# Patient Record
Sex: Male | Born: 1955
Health system: Southern US, Community
[De-identification: ages and names within clinical notes are randomized; demographics above are authoritative.]

## PROBLEM LIST (undated history)

## (undated) DIAGNOSIS — B370 Candidal stomatitis: Secondary | ICD-10-CM

## (undated) DIAGNOSIS — E7800 Pure hypercholesterolemia, unspecified: Secondary | ICD-10-CM

## (undated) DIAGNOSIS — I509 Heart failure, unspecified: Secondary | ICD-10-CM

## (undated) DIAGNOSIS — E669 Obesity, unspecified: Secondary | ICD-10-CM

## (undated) DIAGNOSIS — M1990 Unspecified osteoarthritis, unspecified site: Secondary | ICD-10-CM

## (undated) DIAGNOSIS — I739 Peripheral vascular disease, unspecified: Secondary | ICD-10-CM

## (undated) DIAGNOSIS — M109 Gout, unspecified: Secondary | ICD-10-CM

## (undated) DIAGNOSIS — E782 Mixed hyperlipidemia: Secondary | ICD-10-CM

## (undated) DIAGNOSIS — I429 Cardiomyopathy, unspecified: Secondary | ICD-10-CM

## (undated) DIAGNOSIS — I639 Cerebral infarction, unspecified: Secondary | ICD-10-CM

## (undated) DIAGNOSIS — I2510 Atherosclerotic heart disease of native coronary artery without angina pectoris: Secondary | ICD-10-CM

## (undated) DIAGNOSIS — E1169 Type 2 diabetes mellitus with other specified complication: Secondary | ICD-10-CM

## (undated) DIAGNOSIS — I252 Old myocardial infarction: Secondary | ICD-10-CM

## (undated) DIAGNOSIS — E119 Type 2 diabetes mellitus without complications: Secondary | ICD-10-CM

## (undated) DIAGNOSIS — F32A Depression, unspecified: Secondary | ICD-10-CM

## (undated) DIAGNOSIS — I10 Essential (primary) hypertension: Secondary | ICD-10-CM

## (undated) DIAGNOSIS — F329 Major depressive disorder, single episode, unspecified: Secondary | ICD-10-CM

## (undated) DIAGNOSIS — E785 Hyperlipidemia, unspecified: Secondary | ICD-10-CM

## (undated) HISTORY — DX: Heart failure, unspecified: I50.9

## (undated) HISTORY — DX: Major depressive disorder, single episode, unspecified: F32.9

## (undated) HISTORY — DX: Cardiomyopathy, unspecified: I42.9

## (undated) HISTORY — DX: Hyperlipidemia, unspecified: E78.5

## (undated) HISTORY — DX: Mixed hyperlipidemia: E78.2

## (undated) HISTORY — DX: Type 2 diabetes mellitus without complications: E11.9

## (undated) HISTORY — PX: ORIF WRIST FRACTURE: SHX2133

## (undated) HISTORY — DX: Atherosclerotic heart disease of native coronary artery without angina pectoris: I25.10

## (undated) HISTORY — DX: Old myocardial infarction: I25.2

## (undated) HISTORY — DX: Candidal stomatitis: B37.0

## (undated) HISTORY — PX: NECK SURGERY: SHX720

## (undated) HISTORY — DX: Essential (primary) hypertension: I10

## (undated) HISTORY — DX: Peripheral vascular disease, unspecified: I73.9

## (undated) HISTORY — DX: Unspecified osteoarthritis, unspecified site: M19.90

## (undated) HISTORY — DX: Obesity, unspecified: E66.9

## (undated) HISTORY — DX: Cerebral infarction, unspecified: I63.9

## (undated) HISTORY — DX: Pure hypercholesterolemia, unspecified: E78.00

## (undated) HISTORY — PX: LAMINOTOMY: SHX998

## (undated) HISTORY — DX: Gout, unspecified: M10.9

## (undated) HISTORY — DX: Type 2 diabetes mellitus with other specified complication: E11.69

## (undated) HISTORY — DX: Depression, unspecified: F32.A

---

## 1999-02-16 ENCOUNTER — Ambulatory Visit (HOSPITAL_COMMUNITY): Admission: RE | Admit: 1999-02-16 | Discharge: 1999-02-16 | Payer: Self-pay | Admitting: Family Medicine

## 1999-02-16 ENCOUNTER — Encounter: Payer: Self-pay | Admitting: Family Medicine

## 1999-03-23 ENCOUNTER — Encounter: Payer: Self-pay | Admitting: Neurosurgery

## 1999-03-23 ENCOUNTER — Observation Stay (HOSPITAL_COMMUNITY): Admission: RE | Admit: 1999-03-23 | Discharge: 1999-03-23 | Payer: Self-pay | Admitting: Neurosurgery

## 1999-04-14 ENCOUNTER — Ambulatory Visit (HOSPITAL_COMMUNITY): Admission: RE | Admit: 1999-04-14 | Discharge: 1999-04-14 | Payer: Self-pay | Admitting: Neurosurgery

## 1999-04-14 ENCOUNTER — Encounter: Payer: Self-pay | Admitting: Neurosurgery

## 1999-05-06 ENCOUNTER — Encounter: Payer: Self-pay | Admitting: Neurosurgery

## 1999-05-06 ENCOUNTER — Ambulatory Visit (HOSPITAL_COMMUNITY): Admission: RE | Admit: 1999-05-06 | Discharge: 1999-05-06 | Payer: Self-pay | Admitting: Neurosurgery

## 2001-10-17 HISTORY — PX: OTHER SURGICAL HISTORY: SHX169

## 2001-12-05 ENCOUNTER — Encounter: Payer: Self-pay | Admitting: Orthopedic Surgery

## 2001-12-05 ENCOUNTER — Inpatient Hospital Stay (HOSPITAL_COMMUNITY): Admission: EM | Admit: 2001-12-05 | Discharge: 2001-12-08 | Payer: Self-pay | Admitting: *Deleted

## 2002-03-08 ENCOUNTER — Ambulatory Visit: Admission: RE | Admit: 2002-03-08 | Discharge: 2002-03-08 | Payer: Self-pay | Admitting: Orthopedic Surgery

## 2005-01-06 ENCOUNTER — Ambulatory Visit (HOSPITAL_COMMUNITY): Admission: RE | Admit: 2005-01-06 | Discharge: 2005-01-06 | Payer: Self-pay | Admitting: Family Medicine

## 2005-03-15 ENCOUNTER — Ambulatory Visit (HOSPITAL_COMMUNITY): Admission: RE | Admit: 2005-03-15 | Discharge: 2005-03-15 | Payer: Self-pay | Admitting: Neurosurgery

## 2005-03-25 ENCOUNTER — Ambulatory Visit (HOSPITAL_COMMUNITY): Admission: RE | Admit: 2005-03-25 | Discharge: 2005-03-25 | Payer: Self-pay | Admitting: Neurosurgery

## 2005-04-26 ENCOUNTER — Encounter (HOSPITAL_COMMUNITY): Admission: RE | Admit: 2005-04-26 | Discharge: 2005-05-26 | Payer: Self-pay | Admitting: Neurosurgery

## 2006-10-17 DIAGNOSIS — I252 Old myocardial infarction: Secondary | ICD-10-CM

## 2006-10-17 HISTORY — DX: Old myocardial infarction: I25.2

## 2006-10-28 ENCOUNTER — Encounter: Admission: RE | Admit: 2006-10-28 | Discharge: 2006-10-28 | Payer: Self-pay | Admitting: Orthopedic Surgery

## 2007-12-16 ENCOUNTER — Encounter: Admission: RE | Admit: 2007-12-16 | Discharge: 2007-12-16 | Payer: Self-pay | Admitting: Orthopedic Surgery

## 2008-03-24 ENCOUNTER — Inpatient Hospital Stay (HOSPITAL_COMMUNITY): Admission: EM | Admit: 2008-03-24 | Discharge: 2008-03-26 | Payer: Self-pay | Admitting: Cardiology

## 2008-03-24 ENCOUNTER — Ambulatory Visit: Payer: Self-pay | Admitting: Cardiovascular Disease

## 2008-03-26 ENCOUNTER — Encounter: Payer: Self-pay | Admitting: Cardiology

## 2008-03-31 ENCOUNTER — Ambulatory Visit (HOSPITAL_COMMUNITY): Admission: RE | Admit: 2008-03-31 | Discharge: 2008-04-01 | Payer: Self-pay | Admitting: Cardiovascular Disease

## 2008-04-03 ENCOUNTER — Ambulatory Visit: Payer: Self-pay | Admitting: Cardiology

## 2008-04-11 ENCOUNTER — Ambulatory Visit: Payer: Self-pay | Admitting: Cardiovascular Disease

## 2008-08-01 ENCOUNTER — Ambulatory Visit: Payer: Self-pay | Admitting: Cardiovascular Disease

## 2008-08-17 HISTORY — PX: COLONOSCOPY: SHX174

## 2008-08-17 HISTORY — PX: ESOPHAGOGASTRODUODENOSCOPY: SHX1529

## 2008-08-21 ENCOUNTER — Ambulatory Visit: Payer: Self-pay | Admitting: Gastroenterology

## 2008-09-03 ENCOUNTER — Encounter: Payer: Self-pay | Admitting: Gastroenterology

## 2008-09-03 ENCOUNTER — Ambulatory Visit (HOSPITAL_COMMUNITY): Admission: RE | Admit: 2008-09-03 | Discharge: 2008-09-03 | Payer: Self-pay | Admitting: Gastroenterology

## 2008-09-03 ENCOUNTER — Ambulatory Visit: Payer: Self-pay | Admitting: Gastroenterology

## 2010-04-22 ENCOUNTER — Telehealth: Payer: Self-pay | Admitting: Cardiovascular Disease

## 2010-11-18 NOTE — Progress Notes (Signed)
Summary: meds question   Phone Note From Other Clinic   Caller: Nurse pam Summary of Call: Per Pam from Dr Gerda Diss ofc wants to know if pt will bee to take plavix the rest of his life or can he take Aspirin. ofc [id number] Initial call taken by: Edman Circle,  April 22, 2010 4:16 PM  Follow-up for Phone Call        We have not seen patient in 2 years.  Should be evaluated. But stent was in 2009.  Should be able to stop Plavix if stable Follow-up by: Colon Branch, MD, [facility],  April 25, 2010 10:40 PM  Additional Follow-up for Phone Call Additional follow up Details #1::        dr Gerda Diss office aware of above Deliah Goody, RN  April 26, 2010 9:12 AM

## 2011-02-04 ENCOUNTER — Ambulatory Visit (HOSPITAL_COMMUNITY)
Admission: RE | Admit: 2011-02-04 | Discharge: 2011-02-04 | Disposition: A | Discharge status: Home / Self Care | Payer: BC Managed Care – PPO | Source: Ambulatory Visit | Attending: Family Medicine | Admitting: Family Medicine

## 2011-02-04 ENCOUNTER — Other Ambulatory Visit (HOSPITAL_COMMUNITY): Payer: Self-pay | Admitting: Family Medicine

## 2011-02-04 DIAGNOSIS — M25519 Pain in unspecified shoulder: Secondary | ICD-10-CM

## 2011-02-04 DIAGNOSIS — M79609 Pain in unspecified limb: Secondary | ICD-10-CM | POA: Insufficient documentation

## 2011-02-04 DIAGNOSIS — R531 Weakness: Secondary | ICD-10-CM

## 2011-02-04 DIAGNOSIS — M79602 Pain in left arm: Secondary | ICD-10-CM

## 2011-02-04 DIAGNOSIS — M542 Cervicalgia: Secondary | ICD-10-CM | POA: Insufficient documentation

## 2011-02-08 ENCOUNTER — Other Ambulatory Visit: Payer: BC Managed Care – PPO

## 2011-02-08 ENCOUNTER — Ambulatory Visit (HOSPITAL_COMMUNITY): Payer: BC Managed Care – PPO

## 2011-02-08 ENCOUNTER — Ambulatory Visit
Admission: RE | Admit: 2011-02-08 | Discharge: 2011-02-08 | Disposition: A | Discharge status: Home / Self Care | Payer: BC Managed Care – PPO | Source: Ambulatory Visit | Attending: Family Medicine | Admitting: Family Medicine

## 2011-02-08 DIAGNOSIS — R531 Weakness: Secondary | ICD-10-CM

## 2011-02-08 DIAGNOSIS — M25519 Pain in unspecified shoulder: Secondary | ICD-10-CM

## 2011-02-21 ENCOUNTER — Encounter: Payer: Self-pay | Admitting: *Deleted

## 2011-02-21 ENCOUNTER — Encounter: Payer: Self-pay | Admitting: Cardiovascular Disease

## 2011-02-22 ENCOUNTER — Encounter: Payer: Self-pay | Admitting: Cardiovascular Disease

## 2011-02-22 ENCOUNTER — Ambulatory Visit (INDEPENDENT_AMBULATORY_CARE_PROVIDER_SITE_OTHER): Payer: BC Managed Care – PPO | Admitting: Cardiovascular Disease

## 2011-02-22 VITALS — BP 124/70 | HR 72 | Ht 75.0 in | Wt 276.0 lb

## 2011-02-22 DIAGNOSIS — I2510 Atherosclerotic heart disease of native coronary artery without angina pectoris: Secondary | ICD-10-CM

## 2011-02-22 MED ORDER — ASPIRIN EC 81 MG PO TBEC: 81.0000 mg | DELAYED_RELEASE_TABLET | Freq: Every day | ORAL | Status: AC

## 2011-02-22 MED ORDER — LISINOPRIL 5 MG PO TABS: 5.0000 mg | ORAL_TABLET | Freq: Every day | ORAL | Status: DC

## 2011-02-22 NOTE — Patient Instructions (Signed)
Your physician recommends that you schedule a follow-up appointment in: 1 year with Dr. Excell Seltzer  Your physician has recommended you make the following change in your medication: START LISINOPRIL 5 mg daily.  PLEASE START A BABY ASPIRIN 81 mg after your surgery.

## 2011-02-22 NOTE — Progress Notes (Signed)
HPI:  This is a 55 year-old gentleman presenting for preoperative cardiovascular evaluation. The patient has coronary artery disease with history of inferior wall myocardial infarction in 2008. He was treated with a bare-metal stent to the right coronary artery. He was noted to have no significant obstructive disease in the LAD or left circumflex.  The patient has a herniated cervical disc and will need cervical spine surgery. He has significant pain in his neck and left arm. He is maintaining good functional capacity over the past few years and does hard physical work without exertional symptoms. He specifically denies chest pain or chest pressure with activity. He denies dyspnea, edema, palpitations, orthopnea, or PND.  He has not been seen in cardiology followup since October 2009 when he was seen by Dr. Eden Emms in [place].  Outpatient Encounter Prescriptions as of 02/22/2011  Medication Sig Dispense Refill  . oxyCODONE-acetaminophen (PERCOCET) 10-325 MG per tablet As needed for pain      . clopidogrel (PLAVIX) 75 MG tablet Take 75 mg by mouth daily.        Marland Kitchen glipiZIDE (GLUCOTROL) 10 MG tablet Take 10 mg by mouth 2 (two) times daily before a meal.        . lisinopril (PRINIVIL,ZESTRIL) 5 MG tablet Take 5 mg by mouth daily.        . metFORMIN (GLUMETZA) 500 MG (MOD) 24 hr tablet Take 500 mg by mouth 2 (two) times daily with a meal.        . pioglitazone (ACTOS) 45 MG tablet Take 45 mg by mouth daily.          Review of patient's allergies indicates no known allergies.  Past Medical History  Diagnosis Date  . Diabetes mellitus   . HTN (hypertension)   . CAD (coronary artery disease)     status post PCI and bare metal stenting of   the proximal right coronary artery with placement of a Driver bare   metal stent.   . Myocardial infarction   . Hypercholesterolemia   . Obesity   . Hyperlipidemia   . DJD (degenerative joint disease)   . First degree AV block     with intermittent second  degree types I and    II heart block.   . Oral candidiasis   . Depression   . Heart murmur 1974  . Heart attack 2008    Past Surgical History  Procedure Date  . Right ankle fracture open reduction internal fixation. 2003  . Laminotomy 2001, 2006      Right L4-5 interlaminar laminotomy with excision of herniated   disc with operating microscope.  . Neck surgery   . Coronary artery bypass graft     2008    History   Social History  . Marital Status: Married    Spouse Name: N/A    Number of Children: N/A  . Years of Education: N/A   Occupational History  . Not on file.   Social History Main Topics  . Smoking status: Former Games developer  . Smokeless tobacco: Not on file   Comment: quit in 1979  . Alcohol Use: No  . Drug Use: No     in the past  . Sexually Active: Not on file   Other Topics Concern  . Not on file   Social History Narrative    He is married and has 3 kids.  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  He denies any tobacco or alcohol  use.     Family History  Problem Relation Age of Onset  . Cancer Mother   . Cancer Father   . Cancer Sister     ROS: General: no fevers/chills/night sweats Eyes: no blurry vision, diplopia, or amaurosis ENT: no sore throat or hearing loss Resp: no cough, wheezing, or hemoptysis CV: no edema or palpitations GI: no abdominal pain, nausea, vomiting, diarrhea, or constipation GU: no dysuria, frequency, or hematuria Skin: no rash Neuro: no headache, numbness, tingling, or weakness of extremities Musculoskeletal: no joint pain or swelling Heme: no bleeding, DVT, or easy bruising Endo: no polydipsia or polyuria  BP 124/70  Pulse 72  Ht 6\' 3"  (1.905 m)  Wt 276 lb (125.193 kg)  BMI 34.50 kg/m2  PHYSICAL EXAM: Pt is alert and oriented, obese gentleman in no distress. HEENT: normal Neck: JVP normal. Carotid upstrokes normal without bruits. No thyromegaly. Lungs: equal expansion, clear bilaterally CV: Apex is  discrete and nondisplaced, RRR without murmur or gallop Abd: soft, NT, +BS, no bruit, no hepatosplenomegaly Back: no CVA tenderness Ext: no C/C/E        Femoral pulses 2+= without bruits        DP/PT pulses intact and = Skin: warm and dry without rash Neuro: CNII-XII intact             Strength intact = bilaterally  EKG:  NSR 72 bpm, age-indeterminate inferior MI.  ASSESSMENT AND PLAN:

## 2011-03-01 NOTE — Consult Note (Signed)
NAME:  [name], [name] NO.:  [id number]   MEDICAL RECORD NO.:  [id number]          PATIENT TYPE:  AMB   LOCATION:  DAY                           FACILITY:  [facility]   PHYSICIAN:  Kassie Mends, M.D.      DATE OF BIRTH:  [birth year]   DATE OF CONSULTATION:  08/21/2008  DATE OF DISCHARGE:                                 CONSULTATION   REFERRING PHYSICIAN:  Donna Bernard, M.D.   REASON FOR CONSULTATION:  Abdominal pain and diarrhea for 4-5 months.   HISTORY OF PRESENT ILLNESS:  Mr. [name] is a 55 year old male who explains  of cramping followed by abdominal pain after eats.  His sugars have  never been well controlled.  He has 6-8 bowel movements a day  sometimes less.  He denies any fever.  He eats very little meal.  He  will consume milk with a cereal milkshake or cheese.  He rarely eats ice  cream.  He has 4-5 sticks of sugar less gum a day.  He has been on  Glucophage for 7-8 years.  He tried taking them off of for 2 weeks and  did not make any difference.  He denies any travel or antibiotic use.  He does have well water.  His wife does not have a diarrhea.  He drinks  4-5 Pepsi once a day and spends the day with tea up to a whole pitcher a  day.  He denies any blood in stool or black tarry stools.  He does have  some stress in raising his grandchild for 2-1/2 years.  He does not take  any BC powders, Goody powders, Aleve, or ibuprofen.  His weight has  increased from 285 pounds to 316 pounds since June 2009.  He denies any  heartburn, indigestion, or problems swallowing.  The abdominal pain is  gone after his bowel movements.  Pepto-Bismol used to work but not any  more.  He has never tried Imodium.  His bowel movements are usually  small quantities.   PAST MEDICAL HISTORY:  1. Diabetes, approximately 8 years.  2. Stent placement in June 2009.   PAST SURGICAL HISTORY:  1. Back surgery.  2. Neck surgery.   ALLERGIES:  No known drug allergies.    MEDICATIONS:  1. Glucophage 500 mg b.i.d.  2. Glucotrol 10 mg b.i.d.  3. Plavix 75 mg daily.  4. Aspirin 81 mg daily.  5. Actos 45 mg daily.  6. Ultravate ointment.   FAMILY HISTORY:  He denies any family history of colon cancer, colon  polyps, or diarrheal illnesses.   SOCIAL HISTORY:  He is married and has 3 kids.  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  He denies any tobacco or alcohol use.   REVIEW OF SYSTEMS:  Per the HPI otherwise all systems are negative.   PHYSICAL EXAMINATION:  VITAL SIGNS:  BMI 38.5 (severely obese),  temperature 97, blood pressure 124/72, and pulse 76.  GENERAL:  He is no apparent distress, alert, and oriented x4.HEENT:  Atraumatic and normocephalic.  Pupils equal and  reactive to light.  Mouth, no oral lesions.  Posterior pharynx without erythema or  exudate.LUNGS:  Clear to auscultation bilaterally.CARDIOVASCULAR:  Regular rhythm.  No murmur.  Normal S1 and S2.ABDOMEN:  Bowel sounds  present, soft, nontender, and nondistended.  No rebound or  guarding.EXTREMITIES:  No cyanosis or edema. NEURO:  He has no focal  neurologic deficits.   LABORATORY DATA:  October 2009, O&P negative x2, stool culture negative,  fecal lactoferrin negative, and C.diff negative.   ASSESSMENT:  Mr. [name] is a 55 year old male with diarrhea.  The  differential diagnoses includes irritable bowel diarrhea predominant,  microscopic colitis, small bowel bacterial overgrowth, fructose  intolerance, or diabetic enteropathy.  Thank for allowing me to see Mr.  [name] in consultation.  My recommendations are as follows.   RECOMMENDATIONS:  1. Will check stools for Giardia antigen.  He will be scheduled for a      colonoscopy followed by an esophagogastroduodenoscopy.  The      esophagogastroduodenoscopy will be done for duodenal biopsies to      evaluate for celiac sprue.  2. If his upper endoscopy and his colonoscopy did not reveal any      etiology for his diarrhea, then he  will have a hydrogen breath test      for fructose intolerance, then for small bowel bacterial      overgrowth.  3. He may continue the aspirin and the Plavix due to his recent stent.      Will add Prilosec 20 mg daily.  He asked to use Imodium 1-2, 30      minutes before meals.  4. He will hold half of Glucotrol the night before his colonoscopy and      hold his Glucotrol on the day of his colonoscopy as well as his      Actos.  5. He has a follow up appointment to see me in 2-3 months.      Kassie Mends, M.D.  Electronically Signed     SM/MEDQ  D:  08/21/2008  T:  08/22/2008  Job:  [id number]   cc:   Donna Bernard, M.D.  Fax: [phone]

## 2011-03-01 NOTE — Op Note (Signed)
NAME:  [name], [name] NO.:  [id number]   MEDICAL RECORD NO.:  [id number]          PATIENT TYPE:  AMB   LOCATION:  DAY                           FACILITY:  [facility]   PHYSICIAN:  Kassie Mends, M.D.      DATE OF BIRTH:  13-Aug-1956   DATE OF PROCEDURE:  09/03/2008  DATE OF DISCHARGE:                               OPERATIVE REPORT   REFERRING PHYSICIAN:  Dr. Lubertha South, MD   PROCEDURE:  1. Colonoscopy.  Random cold forceps biopsy and biopsy of a transverse      colon lesion.  2. Esophagogastroduodenoscopy with cold forceps biopsy of the gastric      and duodenal mucosa.   INDICATION FOR EXAM:  [name] is a 55 year old male complains of  chronic diarrhea. He has never had a colonoscopy.   FINDINGS:  1. A tortuous colon requiring the patient to be in the supine position      successfully intubate the cecum.  Pillow like 6-mm distal      transverse colon lesion with pillow-like texture.  Biopsies      obtained via cold forceps.  Random biopsies obtained via cold      forceps to evaluate for microscopic colitis.  2. Frequent sigmoid colon diverticula.  Otherwise, no polyps, masses,      inflammatory changes, or AVMs seen.  3. Normal retroflexed view of the rectum.  4. Normal esophagus without evidence of Barrett's mass, erosion,      ulceration, or stricture.  5. Few gastric erosions.  Biopsies obtained via cold forceps to      evaluate for H. pylori gastritis.  6. Few superficial duodenal ulcers seen in the duodenal bulb.      Biopsies obtained via cold forceps.  Additional biopsies were      obtained via cold forceps to evaluate for celiac sprue in the      second portion of the duodenum.   DIAGNOSES:  1. No obvious source for [name] diarrhea identified.  2. Mild sigmoid colon diverticulosis.  3. Distal transverse colon lesion, likely a lipoma   RECOMMENDATIONS:  1. Will call [name] with the results of his biopsies.  2. He should hold aspirin and  antiinflammatory drugs for 14 days.  He      may continue his Plavix.  3. He should start using his Prilosec once daily.  He should also use      his Imodium as needed for his diarrhea.  4. He should follow high-fiber diet.  He was given a handout on high-      fiber diet and gastritis.  5. Screening colonoscopy in 10 years.  6. He already has a follow up appointment to see me in 2-3 months.   MEDICATIONS:  1. Demerol 1 mg IV.  2. Versed 7 mg IV.   PROCEDURE TECHNIQUE:  Physical exam was performed.  Informed consent was  obtained from the patient explaining the benefits, risks, and  alternatives of procedure.  The patient was connected to monitor and  placed in left lateral position.  Continuous oxygen was  provided by  nasal cannula, IV medicine administered through an indwelling cannula.  After administration of sedation and rectal exam, the patient's rectum  was intubated and the scope was advanced under direct visualization to  the cecum.  Scope was removed slowly by carefully examining the color,  texture, anatomy, and integrity mucosa on the way out.   After the colonoscopy, the patient's esophagus was intubated with  diagnostic gastroscope.  The scope advanced under direct visualization  to the second portion of the duodenum.  The scope was removed slowly by  carefully examining the color, texture, anatomy, and integrity of the  mucosa on the way out.  The patient was recovered in endoscopy  discharged home in satisfactory condition.   PATH:  Melanosis Coli. Lipoma of colon. Celiac sprue. Gastritis      Kassie Mends, M.D.  Electronically Signed     SM/MEDQ  D:  09/03/2008  T:  09/03/2008  Job:  [id number]   cc:   Donna Bernard, M.D.  Fax: [phone]

## 2011-03-01 NOTE — H&P (Signed)
NAME:  [name], [name] NO.:  [id number]   MEDICAL RECORD NO.:  [id number]          PATIENT TYPE:  INP   LOCATION:  2302                         FACILITY:  [facility]   PHYSICIAN:  Veverly Fells. Excell Seltzer, MD  DATE OF BIRTH:  [birth year]   DATE OF ADMISSION:  03/24/2008  DATE OF DISCHARGE:                              HISTORY & PHYSICAL   PRIMARY CARDIOLOGIST:  [facility].  He will follow up in  [place].   PRIMARY CARE Teodora Baumgarten:  Dr. Gerda Diss.   PATIENT PROFILE:  A 54 year old Caucasian male without prior cardiac  history who presents with acute inferior ST segment elevation,  myocardial infarction.   PROBLEM LIST:  1. Acute inferior ST segment elevation, myocardial infarction.  2. Obesity.  3. Type 2 diabetes mellitus, diagnosed approximately 6 years ago.  4. History of cervical fusion.  5. History of right ACL and MCL repair with right ankle fracture,      status post fall in February 2003.  6. Status post L4-L5 laminotomy and microdissection.  7. Remote tobacco abuse with a 15-pack-year history quitting      approximately 35 years ago.   HISTORY OF PRESENT ILLNESS:  A 55 year old married Caucasian male  without prior cardiac history.  He was in his usual state of health  until approximately 1 week ago when he began to note increasing dyspnea  on exertion.  On Friday, March 21, 2008, he was at work and using a  wheeled walker and noted left shoulder interval pain with left hand and  digit tingling associated with dyspnea and diaphoresis.  Symptoms lasted  for a few minutes and improved with slowing of phase.  He had recurrent  symptoms later that evening, but at this time at rest and associated  with chest pressure and nausea.  He felt poorly the remainder of the  weekend with symptoms intermittently throughout the day, on Saturday and  Sunday and when he woke up this morning, got out of the shower, he felt  somewhat nauseated and washed out.  For these  reasons, his primary care  Carolle Ishii, Dr. Gerda Diss, an ECG was performed in the office showing 2-to 3-  mm inferior ST segment elevation with 1-to 2-mm anterolateral ST segment  depression.  I was advised that the patient presents to the ED and his  wife drove him to [facility] where code STEMI was activated and  the patient received 5000 units of intravenous heparin prior to being  transferred to [facility].  He arrived here at 12:45 p.m. and  catheterization has shown occlusion of the proximal right coronary  artery.   ALLERGIES:  No known drug allergies.   HOME MEDICATIONS:  Glucotrol, Glucophage, and Avandia all of which the  patient takes intermittently, and the last took approximately 1 week  ago.   FAMILY HISTORY:  Mother died of cancer at 7.  Father died of cancer at  15.  He has 2 sisters, one died of cancer at age 66 and another is alive  and well.   SOCIAL HISTORY:  Lives in  [place] with his wife and a 71-1/2-year-old  grandson.  He works as a Administrator.  He has about a 15-pack-year  history of tobacco abuse, smoking a pack and half a day for 10 years,  quitting 35 years ago.  Denies any alcohol use.  He does smoke marijuana  every other day.  He is not routinely exercise.   REVIEW OF SYSTEMS:  Positive for chest pain, shortness of breath,  dyspnea on exertion, all as outlined in the HPI.  He also had associated  chills and sweats last night.  He has had a sore throat over the past  week.  He had this nausea through over the weekend and this morning.  He  has been diagnosed with diabetes for about 6 years.  He is a full code.   PHYSICAL EXAMINATION:  VITAL SIGNS:  He is afebrile.  Heart rate is 55,  respirations 20, blood pressure 120/82, and pulse ox 97% on 3 L.  GENERAL:  Pleasant, Caucasian male in no acute distress.  Awake, alert,  and oriented x3.  HEENT:  Normal with the exception what appears to be oropharyngeal  thrush.  NECK:  No bruits or  JVD.  LUNGS:  Respirations are unlabored and clear to auscultation.  CARDIAC:  Regular with distant S1 and S2.  No S3, S4, or murmurs.  ABDOMEN:  Round, soft, nontender, and nondistended.  Bowel sounds are  present x4.  EXTREMITIES:  Warm, dry, and pink.  No clubbing, cyanosis, or edema.  Dorsalis pedis and posterior tibial pulses 2+ and equal bilaterally.   Chest x-ray is pending.  EKG shows sinus bradycardia rate of 54 and  normal axis, 2-to 3-mm ST segment elevation in leads 2, 3, and aVF, 1-to  2-mm ST depression in 1, aVL, V2, and V3.  Creatinine by I-Stat here in  the cath lab is 0.7.   ASSESSMENT AND PLAN:  1. Acute inferior ST segment elevation myocardial infarction for      emergent catheterization PCI.  Preliminary results, catheterization      reveal normal left coronary tree and occluded proximal RCA.  He is      undergoing PCI currently.  Add aspirin, Plavix, and high-dose      statin.  Hold off on beta-blocker for now in setting of bradycardia      with pending RCA intervention.  2. Diabetes mellitus, noncompliant with p.o. meds at home.  Hold      Glucophage, add sliding scale insulin.  3. Obesity, cardiac rehab.  4. Lipid status, currently unknown.  Check lipids and LFTs, add high-      dose statin.  5. ?Oropharyngeal candida.  Add nystatin swish and swallow.      Nicolasa Ducking, ANP      Veverly Fells. Excell Seltzer, MD  Electronically Signed    CB/MEDQ  D:  03/24/2008  T:  03/25/2008  Job:  [id number]

## 2011-03-01 NOTE — Discharge Summary (Signed)
NAME:  [name] [name] NO.:  [id number]   MEDICAL RECORD NO.:  [id number]          PATIENT TYPE:  INP   LOCATION:  3736                         FACILITY:  [facility]   PHYSICIAN:  Veverly Fells. Excell Seltzer, MD  DATE OF BIRTH:  Aug 09, 1956   DATE OF ADMISSION:  03/24/2008  DATE OF DISCHARGE:  03/26/2008                               DISCHARGE SUMMARY   PRIMARY CARE Deklyn Trachtenberg:  Dr. Gerda Diss.   PRIMARY CARDIOLOGIST:  Will be Dr. Eden Emms in [place].   DISCHARGE DIAGNOSIS:  Acute inferior ST segment elevation myocardial  infarction.   SECONDARY DIAGNOSES:  1. Coronary artery disease status post PCI and bare metal stenting of      the proximal right coronary artery with placement of a Driver bare      metal stent.  2. Hyperlipidemia with multiple statin intolerances including Lipitor,      Zocor, Pravachol and Crestor.  3. Marijuana abuse, which is ongoing.  4. Poorly-controlled type 2 diabetes mellitus with a hemoglobin A1c of      12.1 on this admission.  5. Medication non-adherence.  6. Remote tobacco abuse with a 15 pack year history, quitting      approximately 35 years ago.  7. History of degenerative disk disease.  8. Obesity.  9. First degree AV block with intermittent second degree types I and      II heart block.  10.Oral candidiasis.   ALLERGIES:  No known drug allergies.   PROCEDURES:  1. Left heart cardiac catheterization with successful PCI and stenting      of the right coronary artery.  2. 2-D echocardiogram.   HISTORY OF PRESENT ILLNESS:  A 55 year old Caucasian male without prior  cardiac history.  He was in his usual state of health until  approximately 1 week prior to admission when he began to note increasing  dyspnea on exertion.  By Friday, March 21, 2008, he began to experience  left shoulder and hand pain and tingling associated with dyspnea and  diaphoresis that was occurring with exertion.  Symptoms occurred  intermittently throughout the  weekend and were significantly worse with  exertion.  He saw his primary care [name] [name] on the morning of March 24, 2008, and he was noted to have significant inferior ST segment elevation  with anterolateral ST segment depression.  He was transported via his  car to [facility] where a code STEMI was activated, and the  patient received 5,000 units of IV heparin.  He was transferred to [facility] for further evaluation.   HOSPITAL COURSE:  The patient underwent emergent left heart cardiac  catheterization including a totally occluded proximal RCA with otherwise  non-obstructive disease.  The RCA was successfully stented with a Driver  bare metal stent.  EF was 45% at the time of catheterization.  Post  procedure, the patient was monitored in the coronary intensive care unit  and was noted to exhibit second degree type 2 diabetes heart block with  2:1 block.  For this reason, as well as the baseline bradycardia, beta  blocker therapy was never  initiated.  He was maintained on aspirin and  Plavix therapy.  Because of his history of multiple intolerances to  potent statins, he was started on lovastatin 40 mg q.h.s. in hopes that  he would tolerate this.  He was transferred out to the floor on March 25, 2008 and was initiated on ACE inhibitor therapy secondary to reduced EF.  Throughout his hospital course, his ECG has continued to show 1 to 2 mm  ST segment elevation in the inferior leads with anterolateral ST  depression.  Because of this persistent abnormal ECG, we have made a  copy of the patient's ECG and have given it to him to carry in his  wallet.   [name] has been ambulating without difficulty.  This morning, he has  exhibited first degree AV block with a PR interval of up to 320 ms.  He  has not had any significant pauses and is asymptomatic.  Because of his  heart block, we have arranged for him to have a 48-hour Holter monitor  to be picked up on Monday, March 31, 2008  at 9 a.m. at [facility].  We will be looking for any significant pauses or findings  that may require permanent pacemaker placement.   Overall, [name] has recovered well.  He did undergo a 2-D  echocardiogram this morning, and the results of that are currently  pending.  He is being discharged home today in good condition.   DISCHARGE LABORATORIES:  Hemoglobin 13.6, hematocrit 38.6, WBC 11.0,  platelets 233.  Sodium 139, potassium 3.8, chloride 105, CO2 of 26, BUN  15, creatinine 0.73, glucose 203, total bilirubin 1.3, alkaline  phosphatase 82, AST 108, ALT 77, total protein 6.1, albumin 3.4, calcium  8.4, hemoglobin A1c 12.1.  Peak CK of 625.  Peak MB 29.5.  Peak troponin  I of 20.93.  Total cholesterol 157, triglycerides 162, HDL 28, LDL 97,  TSH 1.810.   DISPOSITION:  The patient is being discharged home today in good  condition.   FOLLOW-UP PLANS AND APPOINTMENTS:  He will obtain a 48-hour Holter  monitor on Monday, March 31, 2008 at 9 a.m. at [facility].  He  will subsequently follow up with Dr. Gerda Diss in 1-2 weeks and Dr. Eden Emms  on April 11, 2008 at 11:30 a.m.   DISCHARGE INSTRUCTIONS:  The patient has been counseled on the  importance of medication compliance.  While hospitalized, he has been  counseled on the importance of marijuana cessation, as well as being  seen and evaluated by the diabetes management and education team.  He  has been given information with regards to free diabetes education  classes at [facility].  He was previously taking Actos,  glipizide and metformin, although he self discontinued these.  It has  been recommended that he reinitiate these.   DISCHARGE MEDICATIONS:  1. Aspirin 325 mg daily.  2. Plavix 75 mg daily.  3. Lovastatin 40 mg q.h.s.  4. Metformin 1000 mg b.i.d. to resume March 27, 2008.  5. Actos 45 mg daily.  6. Glipizide 10 mg b.i.d.  7. Lisinopril 5 mg daily.  8. Nystatin swish and swallow 100,000  units/mL, 5 mL q.i.d.  9. Nitroglycerin 0.4 mg sublingual p.r.n. chest pain.   OUTSTANDING LABORATORY STUDIES:  1. A 2-D echocardiogram is pending from this morning.  2. A 48-hour Holter monitor is pending next week.   DURATION OF DISCHARGE ENCOUNTER:  Sixty minutes including physician  time.      Nicolasa Ducking, ANP      Veverly Fells. Excell Seltzer, MD  Electronically Signed    CB/MEDQ  D:  03/26/2008  T:  03/26/2008  Job:  [id number]   cc:   Dr. Gerda Diss

## 2011-03-01 NOTE — Assessment & Plan Note (Signed)
[facility] HEALTHCARE                       [place] CARDIOLOGY OFFICE NOTE   Rolla, Kedzierski HARRELL NIEHOFF                       MRN:          [id number]  DATE:04/11/2008                            DOB:          1955/11/01    Mr. [name] is seen today as a new patient by me.  He was hospitalized for  an acute inferior wall MI by Dr. Excell Seltzer on March 25, 2008.  The patient  did not have disease in his LAD or CIRC.  He had a total occlusion of  the right coronary artery.   His EF was in the 45% range.  She had a bare-metal stent.  It took me a  while to review his images on [employer].   Again, I had to review his entire chart in hospital stay since I was not  involved with his care.  The patient has been under a lot of stress  lately.  He has a daughter who is not taking care of herself.  She is  living with another person.  She had been living at home with him.  However, she decided to leave and left his 56-year-old grandson with him,  so the patient was dealing with work full-time and raising his 2-year-  old grandson.  Prior to his heart attack, he had been smoking both  cigarettes and pot for over 45 years.  He says he has not gone back to  this.  I counseled him for less than 10 minutes regarding the problems  with smoking.  Clearly, the marijuana is less of a risk, neither one of  them is great for him.  He is continuing to abstain.  He has not been  good with his diabetes.  He has not been taking his Glucophage.  I also  explained to him the importance of tight glucose control in regards to  recurrent coronary disease.  His medical doctor normally follows this.   He has been taking his other medications.  However, there is some  confusion as he lists both pravastatin and lovastatin as his cholesterol  pills.  He will call us and I encouraged him to take only the lovastatin  rather than the pravastatin, since he was prescribed a higher dose.  He  has not had any side  effects from his medicines.  He is bruising a  little easier on the aspirin and Plavix.  He did get a bare-metal stent  and theoretically should only need to be on Plavix for 4 weeks.   The patient has been in and out of work quite a bit, and apparently he  wants to go back to work full-time on April 25, 2008, as his benefits ran  out and he needs money.  He has had issues with work before regarding  arthritis of his knees.  He had a fairly uncomplicated MI and did not  have residual left-sided disease.  His EF is not drastically reduced,  and I think it is okay for him to return to work without limitations on  April 25, 2008.  He is to start cardiac rehab at  [facility] this coming  Tuesday.   REVIEW OF SYSTEMS:  Otherwise remarkable for some mild back pain.  He is  not having any recurrent chest pain.   MEDICATIONS:  1. Metformin 500 b.i.d.  2. Glipizide 10 b.i.d.  3. Actos 45 a day.  4. Pravastatin or lovastatin, not sure which one he will end up taking      for his cholesterol.  5. Plavix 75 a day.  6. Lisinopril 5 a day.  7. Aspirin a day.   PHYSICAL EXAMINATION:  GENERAL:  Remarkable for an overweight white male  in no distress.  VITAL SIGNS:  His weight is 282, blood pressure 110/73, pulse 72 and  regular, respiratory rate 14, and afebrile.  HEENT:  Unremarkable.  NECK:  Carotids are without bruit.  No lymphadenopathy, thyromegaly, or  JVP elevation.  LUNGS:  Clear.  Good diaphragmatic motion.  No wheezing.  HEART:  S1 and S2.  Normal heart sounds.  PMI normal.  ABDOMEN:  Benign, bowel sounds positive.  No hepatosplenomegaly or  hepatojugular reflux.  No tenderness.  No bruit.  EXTREMITIES:  Distal pulses are intact.  No edema.  Cath site is well  healed.  NEUROLOGIC:  Nonfocal.  SKIN:  Warm and dry.  MUSCULOSKELETAL:  No muscular weakness.   IMPRESSION:  1. Inferior myocardial infarction and coronary artery disease,      currently stable.  Continue cardiac rehab.   Return to work on April 25, 2008.  Continue aspirin and beta-blocker.  2. Hypercholesteremia in the setting of coronary artery disease.  He      will call us and likely be on lovastatin 40 a day.  This dose      should be escalated as needed.  Lipid and liver profile in 6      months.  3. Hypertension, currently well controlled.  Continue lisinopril,      particularly in light of his diabetes.  4. Diabetes mellitus.  The patient has a home monitoring kit.  He will      start taking his metformin on a more regular basis.  I gave him a      target fasting sugar of under 100 and postprandial 2-hour sugar of      under 140.  He will follow up with his primary care medical doctor.      In the long run, it would be nice to get him off Actos if his      sugars are under poor control with metformin, and glipizide or      Januvia may be a better drug to add.  He was counseled on a low-      carbohydrate diet.  5. Previous smoking.  Again talked to the patient at length.  He will      call us if he feels the urge to restart and we will give him a      prescription for Wellbutrin.  For the time being since he gas      myocardial infarction, he has stopped both marijuana smoking and      cigarette smoking.   The patient will follow up with one of the [place] doctors in 3  month.     Noralyn Pick. Eden Emms, MD, [facility]  Electronically Signed    PCN/MedQ  DD: 04/11/2008  DT: 04/12/2008  Job #: [id number]

## 2011-03-01 NOTE — Assessment & Plan Note (Signed)
[facility] HEALTHCARE                            CARDIOLOGY OFFICE NOTE   [name], [name]                       MRN:          [id number]  DATE:08/01/2008                            DOB:          06/11/1956    We are seeing him today in followup.  He is the patient I have seen up  in [place].  He had an inferior wall MI treated by Dr. Excell Seltzer in June  of 2009.  He has done well.  He did not have residual left-sided  disease.  He has good LV function.   Unfortunately, he continues to have problems regarding his weight.  He  is up over 300 pounds now.  He is back to work at the [employer].  His sugar has been a problem.  Needless to say with his weight gain, it  has been difficult to control.  I told him to talk to Dr. Gerda Diss at  length about his Actos.  This is not an ideal oral medication for a  heart patient who weighs over 300 pounds to be on.  I suspect it is the  part of the reason his weight has gone up.  He had some stomach  abnormalities and may have gastroparesis or irritable bowel syndrome.  He may be having a colonoscopy.  Apparently, his metformin was stopped,  but this did not help the problem.  I wonder if he should be on Reglan.  In general, I think I would rather have him on Byetta or Lantus insulin,  than on any  Actos.  Hopefully, this can be stopped by Dr. Gerda Diss.  He  has been compliant with his aspirin and Plavix.  He is not having any  significant chest pain.  He continues to have some anxiety.   REVIEW OF SYSTEMS:  Otherwise negative.   PHYSICAL EXAMINATION:  GENERAL:  Remarkable for an overweight white  male, in no distress.  VITAL SIGNS:  Weight is 310, blood pressure 120/75, pulse 79, regular  respiratory rate 14, afebrile.  HEENT:  Unremarkable.  Carotids normal without bruit.  No  lymphadenopathy, thyromegaly, or JVP elevation.  LUNGS:  Clear.  Good diaphragmatic motion.  No wheezing.  S1 and S2.  Normal heart sounds  PMI distant, nonpalpable.  ABDOMEN:  Protuberant.  Bowel sounds positive.  No AAA.  No tenderness.  No bruit.  No hepatosplenomegaly, hepatojugular reflex, tenderness, or  bruit.  Distal pulses are intact.  No edema.  NEURO:  Nonfocal.  SKIN:  Warm and dry.  No muscular weakness.   His electrocardiogram shows sinus rhythm at a rate of 74, inferior wall  MI with Q's in II, III and aVF.   IMPRESSION:  1. Inferior wall myocardial infarction, coronary artery disease, stent      to the right coronary artery.  Continue aspirin and Plavix.  2. Diabetes, follow with Dr. Gerda Diss, hemoglobin A1c quarterly.  Try to      stop Actos and substitute to target hemoglobin A1c 6.5 or less.  3. Hypertension currently well controlled.  Continue lisinopril, low-  sodium diet.  4. Hypercholesterolemia.  Continue pravastatin, lipid and liver      profile in 6 months.     Noralyn Pick. Eden Emms, MD, [facility]  Electronically Signed    PCN/MedQ  DD: 08/01/2008  DT: 08/02/2008  Job #: [id number]

## 2011-03-04 NOTE — Op Note (Signed)
[place]  Patient:    [name], [name] Visit Number: [id number] MRN: [id number]          Service Type: MED Location: [id number] Attending Physician:  Burnard Bunting Dictated by:   Cammy Copa, M.D. Proc. Date: 12/05/00 Admit Date:  12/05/2001 Discharge Date: 12/08/2001                             Operative Report  PREOPERATIVE DIAGNOSIS:  Right ankle bimalleolar ankle fracture.  POSTOPERATIVE DIAGNOSIS:  Right ankle bimalleolar ankle fracture.  PROCEDURE:  Right ankle fracture open reduction internal fixation.  SURGEON:  Cammy Copa, M.D.  ANESTHESIA:  General endotracheal.  ESTIMATED BLOOD LOSS:  50 cc.  DRAINS:  None.  TOURNIQUET TIME:  116 minutes at 300.  OPERATIVE FINDINGS:  Right knee examination under anesthesia, range of motion 0 to 120 degrees with about 5 to 6 cm opening to valgus stress at 0 degrees, and about 6 to 7 mm of opening to valgus stress at 30 degrees.  ACL was out with positive pivot shift.  The patient also had soft tissue swelling around the ankles.  DESCRIPTION OF PROCEDURE:  The patient was brought to the operating room where general endotracheal anesthesia was induced.  Preoperative IV antibiotics were administered.  The right ankle was prepped with Duraprep solution and draped in a sterile manner.  Collier Flowers was used to cover the operative field.  Leg was elevated and exsanguinated with the Esmarch wrap, tourniquet was inflated.  A long incision was made beginning at the tip of the malleolus and extending proximally.  Superficial peroneal nerve was identified, and mobilized anteriorly.  Periosteum was split over the fracture anteriorly and posteriorly.  Initial attempts at reducing the lateral fracture were unsuccessful, and thus attention was directed towards fixing the medial malleolus first.  At this time, a J shaped incision was made over the medial malleolus.  Periosteum was elevated for  1 mm distally and 1 mm proximally around the fracture site.  Anatomic reduction of the fracture was achieved. Two 4/5 screws were then placed with anatomic reduction of the medial malleolus fracture.  This was then reduced from the lateral malleolus.  The medial incision was irrigated, and then closed using two 3-0 Vicryl sutures followed by simple 3-0 nylon sutures.  The lateral side was revisited, and good reduction was obtained on the lateral malleolus.  Fracture was comminuted and extended up the shaft.  Two lag screws were placed to secure the fracture fragments.  A 1/3 recon plate was then contoured to fit the malleolus.  A locking plate was utilized because of the severe nature of the fracture.  The oblique portion of the fracture extended very low making fixation of the distal malleolar piece difficult.  Nonetheless, excellent fixation was achieved with the locking plate in both the proximal and distal fragments. The mortis was found to be stable under fluoroscopy after placing the recon plate.  The recon plate was chosen because of its added strength and resistance to breakage in this large individual.  The tourniquet was released. Bleeding points were encountered and controlled with electrocautery.  Soft tissue coverage was obtained over the plate by reapproximating the periosteal sleeves with 3-0 Vicryl suture.  Skin and subcutaneous tissue were closed using interrupted inverted 3-0 Vicryl suture and simple 3-0 nylon sutures.  A soft dressing and bulky splint were applied.  Pedal pulses were  palpable at the conclusion of the case.  Reduction was anatomic in both AP and lateral planes at the conclusion of the case.  The patient tolerated the procedure well without immediate complications. Dictated by:   Cammy Copa, M.D. Attending Physician:  Burnard Bunting DD:  01/05/02 TD:  01/07/02 Job: [id number]

## 2011-03-04 NOTE — Consult Note (Signed)
[place]. [facility]  Patient:    [name], [name]. Visit Number: [id number] MRN: [id number]          Service Type: Attending:  Anastasio Auerbach, M.D. Dictated by:   Anastasio Auerbach, M.D. Proc. Date: 12/05/01   CC:         Cammy Copa, M.D.  Lilyan Punt, M.D. (FAX [phone])   Consultation Report  DATE OF BIRTH:  [birth year]  REFERRING PHYSICIAN:  Dorene Grebe, M.D.  REASON FOR CONSULTATION:  Diabetes and medical management.  IMPRESSION: 1. Diabetes mellitus, type 2.    a. Diagnosed five years ago.    b. Glycohemoglobin November 2002 was 6.1%. 2. Accidental fall.    a.  Right trimalleolar ankle fracture.    b. Right anterior cruciate ligament talus tear with high-grade medial       collateral ligament injury. 3. Mild normocytic anemia (question baseline)    a. Preoperative hemoglobin 12.0.    b. Mean corpuscular volume 84. 4. Mild leukocytosis (12,100), suspected stress related. 5. History of cervical disk "rupture" status post fusion. 6. History of right hand injury with surgery. 7. Obesity. 8. Dyslipidemia.  RECOMMENDATIONS: 1. Continue home medications (including Glucophage, renal function    normal and low risk of receiving IV contrast this admission). 2. An 1800 calorie ADA diet. 3. CBGs q.a.c. and h.s. with sliding scale insulin as written. 4. Check glycohemoglobin and TSH. 5. Serum iron. 6. DVT prophylaxis with Lovenox when felt safe from operative standpoint.  HISTORY OF PRESENT ILLNESS:  [name] is a 55 year old, obese, Caucasian male with a five-year history of type 2 diabetes mellitus.  He presents after an accidental fall.  He fractured his right ankle and injured his right knee. [name] is planning to operate this afternoon.  We were asked to help with management of his diabetes mellitus.  In talking with his primary care physicians office, I discovered his most recent glycohemoglobin was done in November 2002 and was 6.1%.   This is actually quite good control.  We will repeat that here to see if that has been sustained.  He is currently having ankle pain.  He has no history of heart problems, chest pain, or shortness of breath.  HOME MEDICATIONS: 1. Avandia 4 mg p.o. q.a.m. 2. Zocor 20 mg q.d. 3. Glucophage 1000 mg b.i.d. 4. Glucotrol XL 10 mg b.i.d. 5. Glucosamine.  ALLERGIES:  None.  SOCIAL HISTORY:  [name] is married.  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 He has three children, but only one remaining at home at age 9.  He is a nonsmoker.  He rarely drinks.  No drug use.  FAMILY HISTORY:  Positive for heart disease.  PAST MEDICAL HISTORY:  See Impression.  REVIEW OF SYSTEMS:  [name] denies any visual changes.  No headache.  No significant weight changes.  No dysarthria.  No dysphagia.  No cough.  No hemoptysis.  No hematemesis.  No abdominal pain.  He has chronically loose bowel movements with no change in this.  He has urinary frequency but no hesitancy.  I suspect this is due to intermittent hyperglycemia.  He suffers from somewhat chronic right knee pain.  He has taken Lodine in the past.  No history of thyroid disease.  No history of heart problems.  No history of kidney problems.  He has not undergone eye examination since his diagnosis of diabetes.  He has no complaints typical of peripheral neuropathy.  No history of  bleeding or clotting.  Review of Systems except as previously mentioned is negative.  PHYSICAL EXAMINATION:  GENERAL:  Pleasant.  Cooperative.  In no acute distress at rest.  VITAL SIGNS:  Temperature 99.3, blood pressure 131/78, heart rate 81, respiratory rate 20, oxygen saturation 94% on room air.  Morning blood sugar 161.  HEENT:  Atraumatic, normocephalic.  Pupils are equal, round and reactive to light.  Cranial nerves II-XII grossly intact.  Pharynx clear.  Mucous membranes moist.  NECK:  Supple.  No adenopathy, no mass, no bruits, obese.  LUNGS:  Clear  bilaterally with fair air movement.  No crackles, wheeze, or rhonchi.  HEART:  Regular.  Normal S1, S2.  Somewhat distant.  No audible murmur, although he says he has a childhood history of murmur.  ABDOMEN:  Obese, mildly protuberant.  Nontender, nondistended.  No palpable or percussible masses or organomegaly.  GU/RECTAL:  Exams deferred.  EXTREMITIES:  Right ankle fractured.  No palpable cords.  No cyanosis.  No rash.  No clubbing.  MUSCULOSKELETAL:  Right ankle fracture.  No atrophy.  No fasciculations.  NEUROLOGIC:  The patient is alert and oriented x 3.  Cranial nerves as above. Motor exam reveals normal strength bilaterally except in the right lower extremity.  Sensation is grossly intact.  Gait not assessed.  Upper extremity and cerebellar function normal.  LABORATORY DATA:  Hemoglobin 12.0, MCV 84, WBC 12,100, platelet count 253. Pro time 13.5.  PTT 28.  Sodium 138, potassium 4.0, chloride 106, bicarb 26, BUN 13, creatinine 0.8, glucose 171, calcium 8.4.  DIAGNOSES: 1. Diabetes mellitus, type 2.  [name] has had non-insulin-dependent diabetes    mellitus for approximately five years.  His most recent glycohemoglobin was    in November of 2002 and was quite good at 6.1%.  He admits to not being    completely compliant with dietary restrictions.  At this point, I would    recommend resuming his home medications.  I think that Glucophage can    safely be restarted here in the hospital.  He has normal renal function and    I doubt will be exposed to intravenous contrast.  We will also use a    sliding scale insulin; and, if we are having difficulty controlling his    sugars, I would add a nighttime dose of Lantus.  Hyperglycemia will be    exaggerated by inactivity in this gentleman.  I written to check    glycohemoglobin, thyroid-stimulating hormone, and liver function tests.  I     have updated the patients primary care physician on the current situation. 2. Mild  normocytic anemia.  I will check an iron level and guaiac stools.  I    suspect it will fall postoperatively, and he has been typed and crossed in    case transfusion is required. 3. Deep venous thrombosis prophylaxis.  Consider Lovenox once it is felt safe    from a post surgical standpoint. 4. Gastrointestinal prophylaxis.  I would use a proton pump inhibitor while he    is in the hospital.  I doubt he will need this at the time of discharge.  I will continue to follow along to help with diabetes management as well as any other medical problems that might occur.  I appreciate the opportunity to see [name]. Dictated by:   Anastasio Auerbach, M.D. Attending:  Anastasio Auerbach, M.D. DD:  12/05/01 TD:  12/05/01 Job: [id number]

## 2011-03-04 NOTE — Op Note (Signed)
NAME:  [name], [name] NO.:  [id number]   MEDICAL RECORD NO.:  [id number]          PATIENT TYPE:  OIB   LOCATION:  3009                         FACILITY:  [facility]   PHYSICIAN:  Reinaldo Meeker, M.D. DATE OF BIRTH:  Nov 30, 1955   DATE OF PROCEDURE:  03/25/2005  DATE OF DISCHARGE:                                 OPERATIVE REPORT   PREOPERATIVE DIAGNOSIS:  Herniated disc L4-5 right.   POSTOPERATIVE DIAGNOSIS:  Herniated disc L4-5 right.   PROCEDURE:  Right L4-5 interlaminar laminotomy with excision of herniated  disc with operating microscope.   SECONDARY PROCEDURE:  Microdissection L4-5 disc and L5 nerve root.   SURGEON:  Reinaldo Meeker, M.D.   ASSISTANT:  Tia Alert, M.D.   DESCRIPTION OF PROCEDURE:  After being placed in the prone position, the  patient's back was prepped and draped in the usual sterile fashion.  A  localizing x-ray was taken prior to incision to identify the appropriate  level.  A midline incision was made over the spinous processes of L4 and L5.  Using bovie current, incision was carried down to spinous processes.  Subperiosteal dissection was then carried out on left-sided  spinous  processes and lamina.  Self-retaining retractor was placed for exposure.  X-  ray showed approach of the appropriate level.  Using the high speed drill,  the inferior on half of the L4 lamina and the medial one third of the facet  joint were removed.  Drill was then used to remove the superior one half of  the L5 lamina.  A residual bone ligamentum flavum removed in a piecemeal  fashion.  The microscope was draped, brought in the field and used through  the remainder of the case.  Using microdissection technique, the lateral  aspect of thecal sac and L5 nerve were identified.  Further coagulation was  carried out down the floor of the canal to identify the L4-5 disc which is  found to be markedly herniated with an inferior fragment along the pedicle.  The  free fragment was removed initially and the disc space incised with the  15 blade.  Using pituitary rongeurs and curettes, thorough disc space  cleanout was carried out while at the same time great care was taken to  avoid injury to the neural elements.  This was successfully done.  At this  point, inspection is carried out in all directions for any evidence of  residual compression and none could be identified.  Large amounts of  irrigation were carried out and any bleeding controlled with bipolar  coagulation and Gelfoam.  Wound was then closed using interrupted Vicryl in  the muscle, fascia, subcutaneous, subcuticular tissues and staples on the  skin.  Sterile dressing was then applied and the patient was extubated and  taken to the recovery room in stable condition.       ROK/MEDQ  D:  03/25/2005  T:  03/25/2005  Job:  [id number]

## 2011-03-04 NOTE — Discharge Summary (Signed)
[place]. [facility]  Patient:    [name], [name] Visit Number: [id number] MRN: [id number]          Service Type: MED Location: 5000 [id number] Attending Physician:  Burnard Bunting Dictated by:   Cammy Copa, M.D. Admit Date:  12/05/2001 Discharge Date: 12/08/2001                             Discharge Summary  DISCHARGE DIAGNOSES: 1. Right ankle bimalleolar fractures. 2. Right knee anterior cruciate ligament, medial cruciate ligament tear.  SECONDARY DIAGNOSES: 1. Non-insulin dependent diabetes. 2. Prior right knee meniscal injury. 3. Prior cervical fusion. 4. Right hand injury.  HISTORY OF PRESENT ILLNESS:  [name] is a 55 year old patient who injured his right ankle today when he fell on the ice.  He reports right knee and ankle pain.  He denies any other orthopedic complaints. 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  CURRENT MEDICATIONS:  Glucotrol, glucophage and Avandia.  ALLERGIES:  No known drug allergies.  HOSPITAL COURSE:  The patient was admitted to the orthopedic service. At that time, he underwent right ankle open reduction and internal fixation without complications. He tolerated the procedure well.  MRI scan was obtained on the right knee which demonstrated ACL and MCL tear. That will be addressed at a later hospitalization.  Knee brace was obtained so that range of motion could be started.  Medical consultation was obtained to optimize glucose management to prevent wound complications. The patient was started on Coumadin for DVT prophylaxis. Postoperatively, the patients toes were sensate and perfused. The patient was mobilized at physical therapy in a nonweightbearing fashion in the splint. He was transitioned off IV pain medicines and oral pain medicines. He glucose was well managed in the hospital.  He was discharged home on December 08, 2001.  DISCHARGE MEDICATIONS:  Preadmission medications plus Coumadin and  Percocet for pain.  DISCHARGE INSTRUCTIONS:  INRs will be checked by home health care agency.  ACTIVITY:  He will maintain his nonweightbearing status.  FOLLOW-UP:  He will return to clinic in one week for wound check.Dictated by: Cammy Copa, M.D. Attending Physician:  Burnard Bunting DD:  01/05/02 TD:  01/07/02 Job: [id number]

## 2011-03-04 NOTE — H&P (Signed)
[place]. [facility]  Patient:    [name], [name] Visit Number: [id number] MRN: [id number]          Service Type: Attending:  Cammy Copa, M.D. Dictated by:   Cammy Copa, M.D. Adm. Date:  12/05/01                           History and Physical  CHIEF COMPLAINT:  Right ankle and knee pain.  HISTORY OF PRESENT ILLNESS:  [name] is a 55 year old patient who injured his right ankle today when he fell on the ice.  He reports also right knee and ankle pain.  Denies any other orthopedic complaints.  He works at an [employer].  PAST MEDICAL AND SURGICAL HISTORY:  Significant for noninsulin-dependent diabetes.  He also has prior right knee meniscal injury.  He had cervical fusion and right hand surgery in the recent past.  MEDICATIONS:  Glucotrol 5 mg two p.o. q.d., Glucophage 500 mg two p.o. q.d., Avandia one q.a.m.  ALLERGIES:  No known drug allergies.  PHYSICAL EXAMINATION:  GENERAL:  He is alert and oriented x 3.  CHEST:  Clear to auscultation.  CARDIAC:  Regular rate and rhythm.  ABDOMEN:  Benign.  EXTREMITIES:  The right lower extremity demonstrates DP and PT pulses 2+/4. Sensation is intact to the dorsal and plantar surfaces of the foot.  He has right knee effusion, ACL laxity.  DIAGNOSTIC STUDIES:  X-rays of the right knee are normal.  Right ankle shows a trimalleolar fracture.  IMPRESSION: 1. Right trimalleolar ankle fracture. 2. Anterior cruciate ligament tear.  PLAN:  We are going to admit for open reduction and internal fixation.  Risks and benefits are discussed.  Primary risks include infection, nonunion, possible development of Charcot arthropathy.  The patient understands the risks and benefits and will proceed with surgery.  Will also get an MRI scan to evaluate his menisci and ACL tear. Dictated by:   Cammy Copa, M.D. Attending:  Cammy Copa, M.D. DD:  12/05/01 TD:  12/05/01 Job:  [phone] [id number]

## 2011-03-09 ENCOUNTER — Encounter: Payer: Self-pay | Admitting: Cardiovascular Disease

## 2011-03-09 DIAGNOSIS — I2510 Atherosclerotic heart disease of native coronary artery without angina pectoris: Secondary | ICD-10-CM | POA: Insufficient documentation

## 2011-03-09 NOTE — Assessment & Plan Note (Signed)
The patient is stable without angina. He has good functional capacity without symptoms. He does not require further preoperative evaluation. Following surgery, I have recommended he resume aspirin 81 mg indefinitely. He should also be started on an ACE inhibitor in the setting of coronary artery disease and type 2 diabetes. We have written him a prescription for lisinopril 5 mg daily. The patient is at low risk of cardiovascular complications related to his upcoming surgery. I would be happy to see him during his hospitalization if he has any problems.

## 2011-03-10 ENCOUNTER — Telehealth: Payer: Self-pay | Admitting: Cardiovascular Disease

## 2011-03-10 NOTE — Telephone Encounter (Signed)
I left a message on the pt's voicemail that 02/22/11 office note faxed to Dr Trudee Grip office.

## 2011-04-01 ENCOUNTER — Encounter (HOSPITAL_COMMUNITY)
Admission: RE | Admit: 2011-04-01 | Discharge: 2011-04-01 | Disposition: A | Discharge status: Home / Self Care | Payer: BC Managed Care – PPO | Source: Ambulatory Visit | Attending: Neurosurgery | Admitting: Neurosurgery

## 2011-04-01 ENCOUNTER — Other Ambulatory Visit (HOSPITAL_COMMUNITY): Payer: Self-pay | Admitting: Neurosurgery

## 2011-04-01 ENCOUNTER — Ambulatory Visit (HOSPITAL_COMMUNITY)
Admission: RE | Admit: 2011-04-01 | Discharge: 2011-04-01 | Disposition: A | Discharge status: Home / Self Care | Payer: BC Managed Care – PPO | Source: Ambulatory Visit | Attending: Neurosurgery | Admitting: Neurosurgery

## 2011-04-01 ENCOUNTER — Telehealth: Payer: Self-pay | Admitting: Cardiovascular Disease

## 2011-04-01 DIAGNOSIS — Z01818 Encounter for other preprocedural examination: Secondary | ICD-10-CM | POA: Insufficient documentation

## 2011-04-01 DIAGNOSIS — Z01812 Encounter for preprocedural laboratory examination: Secondary | ICD-10-CM | POA: Insufficient documentation

## 2011-04-01 DIAGNOSIS — M5020 Other cervical disc displacement, unspecified cervical region: Secondary | ICD-10-CM

## 2011-04-01 LAB — COMPREHENSIVE METABOLIC PANEL
ALT: 16 U/L (ref 0–53)
AST: 14 U/L (ref 0–37)
BUN: 13 mg/dL (ref 6–23)
CO2: 32 mEq/L (ref 19–32)
Calcium: 9.1 mg/dL (ref 8.4–10.5)
Creatinine, Ser: 0.66 mg/dL (ref 0.50–1.35)
GFR calc non Af Amer: >60 mL/min (ref >60)
Glucose, Bld: 297 mg/dL — ABNORMAL HIGH (ref 70–99)
Sodium: 135 mEq/L (ref 135–145)
Total Bilirubin: 0.5 mg/dL (ref 0.3–1.2)
Total Protein: 6.3 g/dL (ref 6.0–8.3)

## 2011-04-01 LAB — CBC
HCT: 40 % (ref 39.0–52.0)
MCH: 29.6 pg (ref 26.0–34.0)
MCHC: 35.8 g/dL (ref 30.0–36.0)
MCV: 82.8 fL (ref 78.0–100.0)
RDW: 12.4 % (ref 11.5–15.5)
WBC: 8.9 10*3/uL (ref 4.0–10.5)

## 2011-04-01 LAB — SURGICAL PCR SCREEN: Staphylococcus aureus: NEGATIVE

## 2011-04-01 NOTE — Telephone Encounter (Signed)
Faxed OV, EKG & Echo. To [facility] @ [facility] ([phone]).

## 2011-04-05 ENCOUNTER — Telehealth: Payer: Self-pay | Admitting: Cardiovascular Disease

## 2011-04-05 NOTE — Telephone Encounter (Signed)
EKG Faxed to [facility] @ [phone]  04/05/11/km

## 2011-04-07 ENCOUNTER — Observation Stay (HOSPITAL_COMMUNITY): Payer: BC Managed Care – PPO

## 2011-04-07 ENCOUNTER — Inpatient Hospital Stay (HOSPITAL_COMMUNITY)
Admission: AD | Admit: 2011-04-07 | Discharge: 2011-04-07 | DRG: 865 | Disposition: A | Discharge status: Home / Self Care | Payer: BC Managed Care – PPO | Source: Ambulatory Visit | Attending: Neurosurgery | Admitting: Neurosurgery

## 2011-04-07 DIAGNOSIS — M5020 Other cervical disc displacement, unspecified cervical region: Principal | ICD-10-CM | POA: Diagnosis present

## 2011-04-07 LAB — GLUCOSE, CAPILLARY
Glucose-Capillary: 180 mg/dL — ABNORMAL HIGH (ref 70–99)
Glucose-Capillary: 295 mg/dL — ABNORMAL HIGH (ref 70–99)

## 2011-04-14 NOTE — Op Note (Signed)
NAME:  [name], SCHARA NO.:  [id number]  MEDICAL RECORD NO.:  [id number]  LOCATION:  3528                         FACILITY:  [facility]  PHYSICIAN:  Reinaldo Meeker, M.D. DATE OF BIRTH:  07-27-1956  DATE OF PROCEDURE:  04/07/2011 DATE OF DISCHARGE:  04/07/2011                              OPERATIVE REPORT   PREOPERATIVE DIAGNOSIS:  Herniated disk C5-6.  POSTOPERATIVE DIAGNOSIS:  Herniated disk C5-6.  PROCEDURE:  C5-6 anterior cervical diskectomy with trabecular metal interbody fusion followed by Trinica anterior cervical plating.  SURGEON:  Reinaldo Meeker, MD  ASSISTANT:  Dr. Marikay Alar.  PROCEDURE IN DETAIL:  After placed in the supine position and 10 pounds halter traction, the patient's neck was prepped and draped in usual sterile fashion.  Localizing x-rays taken prior to incision to identify the appropriate level.  Transverse incision was made in the right anterior neck, starting at the midline, headed towards the medial aspect of the sternocleidomastoid muscle.  The platysma muscle was then incised transversely.  The natural fascial plane between the strap muscles medially and sternocleidomastoid laterally was identified and followed down to the anterior aspect of the cervical spine.  Longus colli muscle was identified, splint in the midline, stripped away bilaterally with Medical illustrator.  Self-retraining retractor was placed for exposure and x-ray showed approach initially at the C4-5 level.  We dissected down one level to the C5-6.  Using a 15-blade, the anterior disk at C5-6 was incised.  Using pituitary rongeurs and curettes approximately 90% of disk material was removed.  High-speed drill was used to widen the interspace.  Bony shavings were saved for use later in the case.  At this time, the microscope was draped brought into the field used for at the end of the case.  Using microsection technique, remainder of disk material  down the posterior longitudinal ligament was removed.  Ligament was then incised transversely and the cut edge removed with a Kerrison punch.  Herniated disk material and bony overgrowth were then removed to decompress the spinal dura and C6 nerve roots bilaterally.  Very generous foraminal decompression was carried out until the C6 nerve roots were well visualized and decompressed bilaterally.  At this time, inspection was carried out in all directions for any evidence of residual compression and none could be identified. Large amounts of irrigation carried out and any bleeding controlled with Gelfoam.  Measurements were taken and an 8-cm trabecular metal graft was chosen and filled with a mixture of Equivabone and autologous bone.  It was then impacted at the disk space after hemostasis was once more confirmed.  Fluoroscopy showed to be in good position.  An appropriate length Trinica anterior cervical plate was then chosen.  Under fluoroscopic guidance, drill holes were placed followed by placing a 14- mm screws x4.  Locking mechanism then rotated into locked position. Final fluoroscopy showed good position of the plates, screws and plug. Large amounts of irrigation was carried out and any bleeding controlled with bipolar coagulation.  Wound was then closed with inverted Vicryl on the platysma muscle and inverted 5-0 PDS on the subcu layer and Steri-Strips on the skin.  Sterile  dressing and soft collar applied.  The patient was extubated, taken to recovery room in stable condition.          ______________________________ Reinaldo Meeker, M.D.     ROK/MEDQ  D:  04/07/2011  T:  04/08/2011  Job:  [id number]  Electronically Signed by Aliene Beams M.D. on 04/14/2011 10:37:53 AM

## 2011-04-26 ENCOUNTER — Other Ambulatory Visit: Payer: Self-pay | Admitting: Neurosurgery

## 2011-04-26 ENCOUNTER — Ambulatory Visit
Admission: RE | Admit: 2011-04-26 | Discharge: 2011-04-26 | Disposition: A | Discharge status: Home / Self Care | Payer: BC Managed Care – PPO | Source: Ambulatory Visit | Attending: Neurosurgery | Admitting: Neurosurgery

## 2011-04-26 DIAGNOSIS — M542 Cervicalgia: Secondary | ICD-10-CM

## 2011-04-26 DIAGNOSIS — M5020 Other cervical disc displacement, unspecified cervical region: Secondary | ICD-10-CM

## 2011-07-14 LAB — LIPID PANEL
Cholesterol: 157
HDL: 28 — ABNORMAL LOW
HDL: 30 — ABNORMAL LOW
Triglycerides: 146
Triglycerides: 162 — ABNORMAL HIGH
VLDL: 29

## 2011-07-14 LAB — COMPREHENSIVE METABOLIC PANEL
ALT: 76 — ABNORMAL HIGH
ALT: 77 — ABNORMAL HIGH
AST: 103 — ABNORMAL HIGH
Alkaline Phosphatase: 83
BUN: 12
CO2: 21
CO2: 23
Calcium: 8.7
Calcium: 8.9
GFR calc Af Amer: >60
GFR calc non Af Amer: >60
GFR calc non Af Amer: >60
Glucose, Bld: 261 — ABNORMAL HIGH
Glucose, Bld: 285 — ABNORMAL HIGH
Potassium: 3.7
Sodium: 138
Sodium: 139
Total Protein: 6.4

## 2011-07-14 LAB — CBC
HCT: 38.7 — ABNORMAL LOW
Hemoglobin: 13.6
Hemoglobin: 13.7
Hemoglobin: 13.7
MCHC: 35
MCHC: 35.2
MCHC: 35.3
MCHC: 35.6
MCV: 84.4
RBC: 4.56
RBC: 4.58
RDW: 13.2
RDW: 13.2

## 2011-07-14 LAB — HEMOGLOBIN A1C
Hgb A1c MFr Bld: 12.1 — ABNORMAL HIGH
Mean Plasma Glucose: 353

## 2011-07-14 LAB — TSH
TSH: 1.742
TSH: 1.81

## 2011-07-14 LAB — DIFFERENTIAL
Basophils Absolute: 0
Basophils Relative: 0
Basophils Relative: 0
Eosinophils Absolute: 0
Eosinophils Relative: 0
Lymphs Abs: 1.6
Monocytes Absolute: 1.3 — ABNORMAL HIGH
Monocytes Relative: 9
Neutro Abs: 8.1 — ABNORMAL HIGH
Neutrophils Relative %: 79 — ABNORMAL HIGH

## 2011-07-14 LAB — PROTIME-INR
INR: 1.4
Prothrombin Time: 17.3 — ABNORMAL HIGH

## 2011-07-14 LAB — URINALYSIS, ROUTINE W REFLEX MICROSCOPIC
Leukocytes, UA: NEGATIVE
Nitrite: NEGATIVE
Protein, ur: 30 — AB
Urobilinogen, UA: 1

## 2011-07-14 LAB — BASIC METABOLIC PANEL
CO2: 25
CO2: 26
Calcium: 8.3 — ABNORMAL LOW
Calcium: 8.4
Creatinine, Ser: 0.73
Creatinine, Ser: 0.8
GFR calc Af Amer: >60
Glucose, Bld: 203 — ABNORMAL HIGH
Glucose, Bld: 235 — ABNORMAL HIGH
Sodium: 139

## 2011-07-14 LAB — POCT I-STAT, CHEM 8
Calcium, Ion: 1.15
HCT: 39
Hemoglobin: 13.3
Sodium: 135
TCO2: 22

## 2011-07-14 LAB — URINE MICROSCOPIC-ADD ON

## 2011-07-14 LAB — CARDIAC PANEL(CRET KIN+CKTOT+MB+TROPI)
CK, MB: 29.5 — ABNORMAL HIGH
Troponin I: 8.75

## 2011-07-14 LAB — TROPONIN I: Troponin I: 14.54

## 2012-02-10 ENCOUNTER — Other Ambulatory Visit: Payer: Self-pay | Admitting: Family Medicine

## 2012-02-10 ENCOUNTER — Ambulatory Visit (HOSPITAL_COMMUNITY)
Admission: RE | Admit: 2012-02-10 | Discharge: 2012-02-10 | Disposition: A | Discharge status: Home / Self Care | Payer: BC Managed Care – PPO | Source: Ambulatory Visit | Attending: Family Medicine | Admitting: Family Medicine

## 2012-02-10 DIAGNOSIS — M25569 Pain in unspecified knee: Secondary | ICD-10-CM | POA: Insufficient documentation

## 2012-02-10 DIAGNOSIS — M25469 Effusion, unspecified knee: Secondary | ICD-10-CM | POA: Insufficient documentation

## 2012-02-10 DIAGNOSIS — M1710 Unilateral primary osteoarthritis, unspecified knee: Secondary | ICD-10-CM | POA: Insufficient documentation

## 2012-02-10 DIAGNOSIS — M25561 Pain in right knee: Secondary | ICD-10-CM

## 2012-02-15 ENCOUNTER — Other Ambulatory Visit (HOSPITAL_COMMUNITY): Payer: BC Managed Care – PPO

## 2012-04-16 ENCOUNTER — Ambulatory Visit (HOSPITAL_COMMUNITY)
Admission: RE | Admit: 2012-04-16 | Discharge: 2012-04-16 | Disposition: A | Discharge status: Home / Self Care | Payer: BC Managed Care – PPO | Source: Ambulatory Visit | Attending: Family Medicine | Admitting: Family Medicine

## 2012-04-16 DIAGNOSIS — M25519 Pain in unspecified shoulder: Secondary | ICD-10-CM | POA: Insufficient documentation

## 2012-04-16 DIAGNOSIS — IMO0001 Reserved for inherently not codable concepts without codable children: Secondary | ICD-10-CM | POA: Insufficient documentation

## 2012-04-16 DIAGNOSIS — M6281 Muscle weakness (generalized): Secondary | ICD-10-CM | POA: Insufficient documentation

## 2012-04-16 DIAGNOSIS — M25619 Stiffness of unspecified shoulder, not elsewhere classified: Secondary | ICD-10-CM | POA: Insufficient documentation

## 2012-04-16 DIAGNOSIS — M75102 Unspecified rotator cuff tear or rupture of left shoulder, not specified as traumatic: Secondary | ICD-10-CM | POA: Insufficient documentation

## 2012-04-16 NOTE — Evaluation (Signed)
Occupational Therapy Evaluation  Patient Details  Name: [name] MRN: [id number] Date of Birth: 09-02-1956  Today's Date: 04/16/2012 Time: 4098-1191 OT Time Calculation (min): 40 min  Evaluation: 4782-9562 40' Visit#: 1  of 8   Re-eval: 05/14/12  Assessment Diagnosis: L shoulder pain Prior Therapy: None  Past Medical History:  Past Medical History  Diagnosis Date  . Diabetes mellitus   . HTN (hypertension)   . CAD (coronary artery disease)     status post PCI and bare metal stenting of   the proximal right coronary artery with placement of a Driver bare   metal stent.   . Myocardial infarction   . Hypercholesterolemia   . Obesity   . Hyperlipidemia   . DJD (degenerative joint disease)   . First degree AV block     with intermittent second degree types I and    II heart block.   . Oral candidiasis   . Depression   . Heart murmur 1974  . Heart attack 2008   Past Surgical History:  Past Surgical History  Procedure Date  . Right ankle fracture open reduction internal fixation. 2003  . Laminotomy 2001, 2006      Right L4-5 interlaminar laminotomy with excision of herniated   disc with operating microscope.  . Neck surgery     Subjective Symptoms/Limitations Symptoms: S: I've had pain in my L shoulder for about 6-9 months. It has gotten more aggravating over time and my biggest complaint is that it is interrupting my sleep. Pertinent History: Mr. Venus has experienced L shoulder pain for approximately 6 months. He had cervical disc surgery 1 year ago to relieve neck and L shoulder pain. After recovering from surgery, his pain improved for about 6 months and then the L shoulder pain returned and has gotten worse over time. He has been out of work for 3 months due primarily to R knee pain, but also his L shoulder pain. He received a cortisone shot in his L shoulder 3 weeks ago but reports no relief.  He has been referred to occupational therapy for evaluation and treatment.    Special Tests: Painful Arc Test - negative Patient Stated Goals: To be able to sleep without pain. To get it back to normal. Pain Assessment Currently in Pain?: Yes Pain Score:   2 Pain Location: Shoulder Pain Orientation: Left Pain Type: Acute pain Pain Radiating Towards: Pain starts in anterior shoulder region and radiates down upper arm. 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 He reports he is having an evaluation done this week to determine his ability to return to work. Leisure: Hobbies-yes (Comment) Comments: Mr. Epple enjoys working outside, Therapist, occupational and cutting logs, and playing with his 2 grandchildren that live with him and his wife.  Assessment ADL/Vision/Perception ADL ADL Comments: Mr. Balles is able to complete all B/IADLs independently. Sleep and rest is the most greatly affected ADL; he reports getting 2 hours of uninterrupted sleep per night. Extended overhead activity causes pain.  Dominant Hand: Left Vision - History Baseline  Vision: Wears glasses only for reading Patient Visual Report: No change from baseline Perception Perception: Not tested Praxis Praxis: Not tested  Cognition/Observation Cognition Overall Cognitive Status: Appears within functional limits for tasks assessed Arousal/Alertness: Awake/alert Orientation Level: Oriented X4 Observation/Other Assessments Observations: Painful arc test = negative Other Assessments: UEFI= 63/80  (78%)  Sensation/Coordination/Edema Sensation Light Touch: Appears Intact Stereognosis: Not tested Hot/Cold: Not tested Proprioception: Not tested Coordination Gross Motor Movements are Fluid and Coordinated: Yes Fine Motor Movements are Fluid and Coordinated: No  Additional Assessments RUE Assessment RUE Assessment: Within Functional Limits LUE AROM (degrees) LUE Overall AROM Comments: Assessed in seated position; ext/int rot measured with arm adducted at side (Supine PROM equal to seated AROM; approx 15-20 degrees limit) Left Shoulder Flexion: 150 Degrees Left Shoulder ABduction: 150 Degrees Left Shoulder Internal Rotation: 70 Degrees Left Shoulder External Rotation: 70 Degrees LUE Strength Left Shoulder Flexion:  (4+/5) Left Shoulder ABduction:  (4+/5) Left Shoulder Internal Rotation:  (4+/5) Left Shoulder External Rotation:  (4+/5) Palpation Palpation: Min-mod fascial restrictions noted in L shoulder/upper arm regions          Occupational Therapy Assessment and Plan OT Assessment and Plan Clinical Impression Statement: A: 56 y/o male who presents with L shoulder pain, increased fascial restrictions, limited ROM and strength affecting his ability to perform B/IADL, work and leisure tasks independently and absent of pain.  Pt will benefit from skilled therapeutic intervention in order to improve on the following deficits: Decreased activity tolerance;Decreased range of motion;Decreased strength;Increased fascial restricitons;Impaired UE functional use;Pain Rehab Potential: Good OT Frequency: Min 2X/week OT Duration: 4 weeks OT Treatment/Interventions: Self-care/ADL training;Therapeutic exercise;Manual therapy;Modalities;Therapeutic activities;Patient/family education OT Plan: P: Skilled OT intervention 2x per week to decrease pain and restrictions and increase A/PROM  and strength needed to return to full duties at work and home.  Treatment Plan:  MFR and manual stretching,  therapy ball, wall wash, AROM in supine and seated, adding weights and theraband as AROM improves and pain decreases.    Goals Short Term Goals Time to Complete Short Term Goals: 2 weeks Short Term Goal 1: Patient will be educated on HEP. Short Term Goal 2: Patient will improve L shoulder PROM to WNL to increase independence with reaching into overhead kitchen cabinets. Short Term Goal 3: Patient will decrease fascial restrictions in L shoulder/upper arm regions from min-mod to min. Short Term Goal 4: Patient will decrease pain to 4/10 when working in his shop and yard. Long Term Goals Long Term Goal 1: Patient will improve to pain-free PLOF with all B/IADL, work and leisure tasks. Long Term Goal 2: Patient will improve L shoulder AROM to WNL in order to increase ability to play with and care for 2 grandchildren. Long Term Goal 3: Patient will improve L shoulder strength to 5/5 to increase independence with lifting tools and objects at work. Long Term Goal 4: Patient will have trace fascial restrictions in L shoulder and upper arm regions.  Long Term Goal 5: Patient will decrease pain to 2/10 during daily work and leisure tasks that require overhead movements.  Problem List Patient Active Problem List  Diagnosis  . Coronary atherosclerosis of native coronary artery  . Pain in joint, shoulder region  . Muscle weakness (generalized)  . Rotator cuff syndrome of left shoulder    End of Session Activity Tolerance: Patient tolerated treatment well General Behavior During Session: [facility] for tasks performed Cognition: [facility] for tasks performed OT Plan of Care OT Home Exercise Plan: Shoulder stretches &  theraband (green) OT Patient Instructions: Do HEP 1-2x per day. Apply heat to decrease tigthtness and discomfort. Consulted and Agree with Plan of Care: Patient    Laverta Baltimore, OTS Occupational Therapy Student  04/16/2012, 10:19 AM  Physician Documentation Your signature is required to  indicate approval of the treatment plan as stated above.  Please sign and either send electronically or make a copy of this report for your files and return this physician signed original.  Please mark one 1.__approve of plan  2. ___approve of plan with the following conditions.   ______________________________                                                          _____________________ Physician Signature                                                                                                             Date

## 2012-04-16 NOTE — Evaluation (Signed)
Occupational Therapy Evaluation  Patient Details  Name: [name] MRN: [id number] Date of Birth: 1956-04-22  Today's Date: 04/16/2012 Time: 4098-1191 OT Time Calculation (min): 40 min  Evaluation: 4782-9562 40' Visit#: 1  of 8   Re-eval: 05/14/12  Assessment Diagnosis: L shoulder pain Prior Therapy: None  Past Medical History:  Past Medical History  Diagnosis Date  . Diabetes mellitus   . HTN (hypertension)   . CAD (coronary artery disease)     status post PCI and bare metal stenting of   the proximal right coronary artery with placement of a Driver bare   metal stent.   . Myocardial infarction   . Hypercholesterolemia   . Obesity   . Hyperlipidemia   . DJD (degenerative joint disease)   . First degree AV block     with intermittent second degree types I and    II heart block.   . Oral candidiasis   . Depression   . Heart murmur 1974  . Heart attack 2008   Past Surgical History:  Past Surgical History  Procedure Date  . Right ankle fracture open reduction internal fixation. 2003  . Laminotomy 2001, 2006      Right L4-5 interlaminar laminotomy with excision of herniated   disc with operating microscope.  . Neck surgery     Subjective Symptoms/Limitations Symptoms: S: I've had pain in my L shoulder for about 6-9 months. It has gotten more aggravating over time and my biggest complaint is that it is interrupting my sleep. Pertinent History: [name] has experienced L shoulder pain for approximately 6 months. He had cervical disc surgery 1 year ago to relieve neck and L shoulder pain. After recovering from surgery, his pain improved for about 6 months and then the L shoulder pain returned and has gotten worse over time. He has been out of work for 3 months due primarily to R knee pain, but also his L shoulder pain. He received a cortisone shot in his L shoulder 3 weeks ago but reports no relief.  He has been referred to occupational therapy for evaluation and treatment.    Special Tests: Painful Arc Test - negative Patient Stated Goals: To be able to sleep without pain. To get it back to normal. Pain Assessment Currently in Pain?: Yes Pain Score:   2 Pain Location: Shoulder Pain Orientation: Left Pain Type: Acute pain Pain Radiating Towards: Pain starts in anterior shoulder region and radiates down upper arm. 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 He reports he is having an evaluation done this week to determine his ability to return to work. Leisure: Hobbies-yes (Comment) Comments: [name] enjoys working outside, Therapist, occupational and cutting logs, and playing with his 2 grandchildren that live with him and his wife.  Assessment ADL/Vision/Perception ADL ADL Comments: [name] is able to complete all B/IADLs independently. Sleep and rest is the most greatly affected ADL; he reports getting 2 hours of uninterrupted sleep per night. Extended overhead activity causes pain.  Dominant Hand: Left Vision - History Baseline  Vision: Wears glasses only for reading Patient Visual Report: No change from baseline Perception Perception: Not tested Praxis Praxis: Not tested  Cognition/Observation Cognition Overall Cognitive Status: Appears within functional limits for tasks assessed Arousal/Alertness: Awake/alert Orientation Level: Oriented X4 Observation/Other Assessments Observations: Painful arc test = negative Other Assessments: UEFI= 63/80  (78%)  Sensation/Coordination/Edema Sensation Light Touch: Appears Intact Stereognosis: Not tested Hot/Cold: Not tested Proprioception: Not tested Coordination Gross Motor Movements are Fluid and Coordinated: Yes Fine Motor Movements are Fluid and Coordinated: No  Additional Assessments RUE Assessment RUE Assessment: Within Functional Limits LUE AROM (degrees) LUE Overall AROM Comments: Assessed in seated position; ext/int rot measured with arm adducted at side (Supine PROM equal to seated AROM; approx 15-20 degrees limit) Left Shoulder Flexion: 150 Degrees Left Shoulder ABduction: 150 Degrees Left Shoulder Internal Rotation: 70 Degrees Left Shoulder External Rotation: 70 Degrees LUE Strength Left Shoulder Flexion:  (4+/5) Left Shoulder ABduction:  (4+/5) Left Shoulder Internal Rotation:  (4+/5) Left Shoulder External Rotation:  (4+/5) Palpation Palpation: Min-mod fascial restrictions noted in L shoulder/upper arm regions          Occupational Therapy Assessment and Plan OT Assessment and Plan Clinical Impression Statement: A: 56 y/o male who presents with L shoulder pain, increased fascial restrictions, limited ROM and strength affecting his ability to perform B/IADL, work and leisure tasks independently and absent of pain.  Pt will benefit from skilled therapeutic intervention in order to improve on the following deficits: Decreased activity tolerance;Decreased range of motion;Decreased strength;Increased fascial restricitons;Impaired UE functional use;Pain Rehab Potential: Good OT Frequency: Min 2X/week OT Duration: 4 weeks OT Treatment/Interventions: Self-care/ADL training;Therapeutic exercise;Manual therapy;Modalities;Therapeutic activities;Patient/family education OT Plan: P: Skilled OT intervention 2x per week to decrease pain and restrictions and increase A/PROM  and strength needed to return to full duties at work and home.  Treatment Plan:  MFR and manual stretching,  therapy ball, wall wash, AROM in supine and seated, adding weights and theraband as AROM improves and pain decreases.    Goals Short Term Goals Time to Complete Short Term Goals: 2 weeks Short Term Goal 1: Patient will be educated on HEP. Short Term Goal 2: Patient will improve L shoulder PROM to WNL to increase independence with reaching into overhead kitchen cabinets. Short Term Goal 3: Patient will decrease fascial restrictions in L shoulder/upper arm regions from min-mod to min. Short Term Goal 4: Patient will decrease pain to 4/10 when working in his shop and yard. Long Term Goals Long Term Goal 1: Patient will improve to pain-free PLOF with all B/IADL, work and leisure tasks. Long Term Goal 2: Patient will improve L shoulder AROM to WNL in order to increase ability to play with and care for 2 grandchildren. Long Term Goal 3: Patient will improve L shoulder strength to 5/5 to increase independence with lifting tools and objects at work. Long Term Goal 4: Patient will have trace fascial restrictions in L shoulder and upper arm regions.  Long Term Goal 5: Patient will decrease pain to 2/10 during daily work and leisure tasks that require overhead movements.  Problem List Patient Active Problem List  Diagnosis  . Coronary atherosclerosis of native coronary artery  . Pain in joint, shoulder region  . Muscle weakness (generalized)  . Rotator cuff syndrome of left shoulder    End of Session Activity Tolerance: Patient tolerated treatment well General Behavior During Session: [facility] for tasks performed Cognition: [facility] for tasks performed OT Plan of Care OT Home Exercise Plan: Shoulder stretches &  theraband (green) OT Patient Instructions: Do HEP 1-2x per day. Apply heat to decrease tigthtness and discomfort. Consulted and Agree with Plan of Care: Patient    Laverta Baltimore, OTS Occupational Therapy Student Note reviewed by clinical instructor and accurately reflects treatment  session. Shirlean Mylar, OTR/L  04/16/2012, 10:19 AM  Physician Documentation Your signature is required to indicate approval of the treatment plan as stated above.  Please sign and either send electronically or make a copy of this report for your files and return this physician signed original.  Please mark one 1.__approve of plan  2. ___approve of plan with the following conditions.   ______________________________                                                          _____________________ Physician Signature                                                                                                             Date

## 2012-04-23 ENCOUNTER — Inpatient Hospital Stay (HOSPITAL_COMMUNITY)
Admission: RE | Admit: 2012-04-23 | Payer: BC Managed Care – PPO | Source: Ambulatory Visit | Admitting: Occupational Therapy

## 2012-04-25 ENCOUNTER — Ambulatory Visit (HOSPITAL_COMMUNITY)
Admission: RE | Admit: 2012-04-25 | Discharge: 2012-04-25 | Disposition: A | Discharge status: Home / Self Care | Payer: BC Managed Care – PPO | Source: Ambulatory Visit | Attending: Family Medicine | Admitting: Family Medicine

## 2012-04-25 DIAGNOSIS — M6281 Muscle weakness (generalized): Secondary | ICD-10-CM

## 2012-04-25 DIAGNOSIS — M75102 Unspecified rotator cuff tear or rupture of left shoulder, not specified as traumatic: Secondary | ICD-10-CM

## 2012-04-25 DIAGNOSIS — M25519 Pain in unspecified shoulder: Secondary | ICD-10-CM

## 2012-04-25 NOTE — Progress Notes (Signed)
Occupational Therapy Treatment Patient Details  Name: [name] MRN: [id number] Date of Birth: 1956/08/13  Today's Date: 04/25/2012 Time: 0805-0850 OT Time Calculation (min): 45 min  Visit#: 2  of 8   Re-eval:      Authorization:    Authorization Time Period:    Authorization Visit#:   of    Subjective Symptoms/Limitations Symptoms: S: Its more of a tight feeling than pain. I may have overdid it trying to help my son-in-law build a deck. Pain Assessment Currently in Pain?: Yes Pain Score:   1 Pain Location: Shoulder Pain Orientation: Left  Precautions/Restrictions   N/A  Exercise/Treatments Supine Protraction: PROM;AROM;10 reps Horizontal ABduction: PROM;AROM;10 reps External Rotation: PROM;AROM;10 reps Internal Rotation: PROM;AROM;10 reps Flexion: PROM;AROM;10 reps ABduction: PROM;AROM;10 reps Seated Elevation: AROM;10 reps Extension: AROM;10 reps Retraction: AROM;10 reps Row: AROM;10 reps Protraction: AROM;10 reps Horizontal ABduction: AROM;10 reps External Rotation: AROM;10 reps Internal Rotation: AROM;10 reps Flexion: AROM;10 reps Abduction: AROM;10 reps ROM / Strengthening / Isometric Strengthening UBE (Upper Arm Bike): 3' and 3' @ 1.5 Wall Wash: 1' "W" Arms: add next visit X to V Arms: add next visit Proximal Shoulder Strengthening, Seated: add next visit Prot/Ret//Elev/Dep: 1'      Manual Therapy Manual Therapy: Myofascial release Myofascial Release: MFR and manual massage to left scapular, trapezius, shoulder and upper arm regions. Minimal restrictions noted in scap and trap; moderate restrictions in shoulder and upper arm. Manual stretching also performed in all shoulder regions.  Occupational Therapy Assessment and Plan OT Assessment and Plan Clinical Impression Statement: A: AROM WNL with the exception of shoulder flexion which is limited by approximately 10%. Patient required vc and pa during supine and seated exercise to maintain proper  form. He reports shoulder fatigue with wall wash and prot/ret/elev/dep exercise at wall. OT Plan: Add theraband to ext, ret, row. Add 1# weight to supine AROM, no weight in seated. Add proximal shoulder strengthening, W and XtoV arms.   Goals Short Term Goals Short Term Goal 1 Progress: Progressing toward goal Short Term Goal 2 Progress: Progressing toward goal Short Term Goal 3 Progress: Progressing toward goal Short Term Goal 4 Progress: Progressing toward goal Short Term Goal 5 Progress: Progressing toward goal Long Term Goals Long Term Goal 1 Progress: Progressing toward goal Long Term Goal 2: Patient will improve L shoulder AROM to WNL in order to increase ability to play with and care for 2 grandchildren. Long Term Goal 2 Progress: Progressing toward goal Long Term Goal 3: Patient will improve L shoulder strength to 5/5 to increase independence with lifting tools and objects at work. Long Term Goal 3 Progress: Progressing toward goal Long Term Goal 4: Patient will have trace fascial restrictions in L shoulder and upper arm regions.  Long Term Goal 4 Progress: Progressing toward goal Long Term Goal 5: Patient will decrease pain to 2/10 during daily work and leisure tasks that require overhead movements. Long Term Goal 5 Progress: Progressing toward goal  Problem List Patient Active Problem List  Diagnosis  . Coronary atherosclerosis of native coronary artery  . Pain in joint, shoulder region  . Muscle weakness (generalized)  . Rotator cuff syndrome of left shoulder    End of Session Activity Tolerance: Patient tolerated treatment well General Behavior During Session: [facility] for tasks performed Cognition: [facility] for tasks performed    Laverta Baltimore, OTS Occupational Therapy Student  04/25/2012, 8:59 AM

## 2012-04-25 NOTE — Progress Notes (Signed)
Occupational Therapy Treatment Patient Details  Name: [name] MRN: [id number] Date of Birth: August 24, 1956  Today's Date: 04/25/2012 Time: 4098-1191 OT Time Calculation (min): 45 min Manual Therapy [phone] 20' Therapeutic Exercises [phone] 25' Visit#: 2  of 8   Re-eval:  05/14/12     Subjective Symptoms/Limitations Symptoms: S: Its more of a tight feeling than pain. I may have overdid it trying to help my son-in-law build a deck. Pain Assessment Currently in Pain?: Yes Pain Score:   1 Pain Location: Shoulder Pain Orientation: Left  Precautions/Restrictions   N/A  Exercise/Treatments Supine Protraction: PROM;AROM;10 reps Horizontal ABduction: PROM;AROM;10 reps External Rotation: PROM;AROM;10 reps Internal Rotation: PROM;AROM;10 reps Flexion: PROM;AROM;10 reps ABduction: PROM;AROM;10 reps Seated Elevation: AROM;10 reps Extension: AROM;10 reps Retraction: AROM;10 reps Row: AROM;10 reps Protraction: AROM;10 reps Horizontal ABduction: AROM;10 reps External Rotation: AROM;10 reps Internal Rotation: AROM;10 reps Flexion: AROM;10 reps Abduction: AROM;10 reps ROM / Strengthening / Isometric Strengthening UBE (Upper Arm Bike): 3' and 3' @ 1.5 Wall Wash: 1' "W" Arms: add next visit X to V Arms: add next visit Proximal Shoulder Strengthening, Seated: add next visit Prot/Ret//Elev/Dep: 1'      Manual Therapy Manual Therapy: Myofascial release Myofascial Release: MFR and manual massage to left scapular, trapezius, shoulder and upper arm regions. Minimal restrictions noted in scap and trap; moderate restrictions in shoulder and upper arm. Manual stretching also performed in all shoulder regions.  Occupational Therapy Assessment and Plan OT Assessment and Plan Clinical Impression Statement: A: AROM WNL with the exception of shoulder flexion which is limited by approximately 10%. Patient required vc and pa during supine and seated exercise to maintain proper form. He  reports shoulder fatigue with wall wash and prot/ret/elev/dep exercise at wall. OT Plan: Add theraband to ext, ret, row. Add 1# weight to supine AROM, no weight in seated. Add proximal shoulder strengthening, W and XtoV arms.   Goals Short Term Goals Short Term Goal 1 Progress: Progressing toward goal Short Term Goal 2 Progress: Progressing toward goal Short Term Goal 3 Progress: Progressing toward goal Short Term Goal 4 Progress: Progressing toward goal Short Term Goal 5 Progress: Progressing toward goal Long Term Goals Long Term Goal 1 Progress: Progressing toward goal Long Term Goal 2: Patient will improve L shoulder AROM to WNL in order to increase ability to play with and care for 2 grandchildren. Long Term Goal 2 Progress: Progressing toward goal Long Term Goal 3: Patient will improve L shoulder strength to 5/5 to increase independence with lifting tools and objects at work. Long Term Goal 3 Progress: Progressing toward goal Long Term Goal 4: Patient will have trace fascial restrictions in L shoulder and upper arm regions.  Long Term Goal 4 Progress: Progressing toward goal Long Term Goal 5: Patient will decrease pain to 2/10 during daily work and leisure tasks that require overhead movements. Long Term Goal 5 Progress: Progressing toward goal  Problem List Patient Active Problem List  Diagnosis  . Coronary atherosclerosis of native coronary artery  . Pain in joint, shoulder region  . Muscle weakness (generalized)  . Rotator cuff syndrome of left shoulder    End of Session Activity Tolerance: Patient tolerated treatment well General Behavior During Session: [facility] for tasks performed Cognition: [facility] for tasks performed    Laverta Baltimore, OTS Occupational Therapy Student Note reviewed by clinical instructor and accurately reflects treatment session.  Shirlean Mylar, OTR/L  04/25/2012, 8:59 AM

## 2012-04-30 ENCOUNTER — Ambulatory Visit (HOSPITAL_COMMUNITY): Payer: BC Managed Care – PPO | Admitting: Occupational Therapy

## 2012-05-02 ENCOUNTER — Inpatient Hospital Stay (HOSPITAL_COMMUNITY): Admission: RE | Admit: 2012-05-02 | Payer: BC Managed Care – PPO | Source: Ambulatory Visit | Admitting: Specialist

## 2012-05-07 ENCOUNTER — Inpatient Hospital Stay (HOSPITAL_COMMUNITY)
Admission: RE | Admit: 2012-05-07 | Payer: BC Managed Care – PPO | Source: Ambulatory Visit | Admitting: Occupational Therapy

## 2012-05-09 ENCOUNTER — Inpatient Hospital Stay (HOSPITAL_COMMUNITY)
Admission: RE | Admit: 2012-05-09 | Discharge: 2012-05-09 | Payer: BC Managed Care – PPO | Source: Ambulatory Visit | Attending: Specialist | Admitting: Specialist

## 2012-05-14 ENCOUNTER — Ambulatory Visit (HOSPITAL_COMMUNITY): Payer: BC Managed Care – PPO | Admitting: Occupational Therapy

## 2013-01-17 ENCOUNTER — Telehealth: Payer: Self-pay | Admitting: Family Medicine

## 2013-01-17 NOTE — Telephone Encounter (Signed)
Please refill percocet 10/325 #90 one q 6 hours prn avoid overuse

## 2013-01-17 NOTE — Telephone Encounter (Signed)
Rx left up front for patient pickup per doctors orders.

## 2013-01-17 NOTE — Telephone Encounter (Signed)
Patient needs a refill of his percocet 10-325.  We turned him away to the pharmacy to request this, but they sent him back to [place] saying they could not send a request for that to [place].

## 2013-02-15 ENCOUNTER — Telehealth: Payer: Self-pay | Admitting: Family Medicine

## 2013-02-15 NOTE — Telephone Encounter (Signed)
Need written prescription for his Percocet 07-19-24

## 2013-02-15 NOTE — Telephone Encounter (Signed)
Write for twenty-one only. Must see dr. Lorin Picket next week regarding ongoing pain med rxs.

## 2013-02-15 NOTE — Telephone Encounter (Signed)
RX written, picked up by pt wife and advised to make app with Dr. Lorin Picket next week

## 2013-02-16 ENCOUNTER — Encounter: Payer: Self-pay | Admitting: *Deleted

## 2013-02-20 ENCOUNTER — Encounter: Payer: Self-pay | Admitting: Family Medicine

## 2013-02-20 ENCOUNTER — Ambulatory Visit (INDEPENDENT_AMBULATORY_CARE_PROVIDER_SITE_OTHER): Payer: BC Managed Care – PPO | Admitting: Family Medicine

## 2013-02-20 VITALS — BP 126/77 | HR 80 | Wt 269.8 lb

## 2013-02-20 DIAGNOSIS — E1159 Type 2 diabetes mellitus with other circulatory complications: Secondary | ICD-10-CM | POA: Insufficient documentation

## 2013-02-20 DIAGNOSIS — E119 Type 2 diabetes mellitus without complications: Secondary | ICD-10-CM | POA: Insufficient documentation

## 2013-02-20 DIAGNOSIS — E1165 Type 2 diabetes mellitus with hyperglycemia: Secondary | ICD-10-CM

## 2013-02-20 DIAGNOSIS — Z79899 Other long term (current) drug therapy: Secondary | ICD-10-CM

## 2013-02-20 DIAGNOSIS — Z0000 Encounter for general adult medical examination without abnormal findings: Secondary | ICD-10-CM

## 2013-02-20 DIAGNOSIS — E118 Type 2 diabetes mellitus with unspecified complications: Secondary | ICD-10-CM

## 2013-02-20 MED ORDER — OXYCODONE-ACETAMINOPHEN 10-325 MG PO TABS: 1.0000 | ORAL_TABLET | Freq: Four times a day (QID) | ORAL | Status: DC | PRN

## 2013-02-20 MED ORDER — GLIPIZIDE 10 MG PO TABS: 10.0000 mg | ORAL_TABLET | Freq: Two times a day (BID) | ORAL | Status: DC

## 2013-02-20 NOTE — Progress Notes (Signed)
  Subjective:    Patient ID: [name], male    DOB: [birth year], 57 y.o.   MRN: [id number]  HPI Shoulder left-this patient is been having left shoulder pain over the past several months. Has decreased range of motion because of pain and discomfort. Has had long-term problem with this. At one time surgery was discussed with him but he states he cannot afford the time off from work or the money to get this done he uses pain medications helps keep him functional. Patient has a history of diabetes as well. Both knees-he has bilateral knee pain and discomfort he is had previous surgery. Despite the surgery he still has significant pain has eased up on them for several hours a day he has stiffness pain discomfort. Pain medication does help keep him functional. Cervical neck-patient relates significant cervical neck pain although at times it does radiate down into the shoulders and upper arms asthma cause weakness in the arms. He has a hard time looking upward.patient is on Plavix cannot be on NSAIDs Occasionally lower back-patient has intermittent low back pain it sometimes occurs with minimal incidents. Usually it will go away on their own after a few days Diabetes -he does not follow through with good diabetic care. He's been talked to at length in the past. We talked today about healthy diet trying to stay active. He is on medication. I am concerned Actos increases her risk of bladder cancer so I recommended that he stop this. In the past he has not been open to insulin use. We will try to other medication.  Past medical history-hyperlipidemia, diabetes, heart disease.   Review of Systemspatient denies chest tightness pressure pain denies numbness in the feet denies rectal bleeding hematuria.     Objective:   Physical Exam No acute distress. Subjective discomfort in his neck. Lungs are clear no crackles. Heart is regular. Pulses normal. Blood pressure is good. Abdomen soft. Extremities no edema.  Has limited range of motion in the right and left shoulder.       Assessment & Plan:  Left shoulder pain-patient has severe limitation range of motion and significant pain discomfort I do not believe he abuses medication. We talked about allowing for him to go ahead and use the medication he can use it up to 4 times per day as needed. If needing greater than that he is to let us know. 3 prescriptions were written for him Percocet 10 mg/325. 120 tablets. He will followup in 3 months  Diabetes subpar control we talked about stopping Actos because of increased risk of bladder cancer. In its place glipizide 10 mg in the morning 10 mg at noon. He will continue his other cholesterol medicine he will try to check her sugars more often follow a healthy diet and followup again in 3 months we will check A1c at that visit. Patient was given papers to do his lab work 25-30 minutes spent with patient discussing his chronic pain and diabetes.

## 2013-02-20 NOTE — Patient Instructions (Addendum)
With your pain medicine he may use up to 4 Percocets per day. We will give you today 3 separate prescriptions that he can get one filled every 30 days. I would like to see back in 3 months and at that time we can issue more prescriptions.  Also remember to stop Actos. In its place we will use a medication called glipizide. He will take one in the morning and one at supper time along with your other medication. This should help get her sugar under better control. If you are having any low sugar spells please let us know.  Also in the near future please do your blood work when I get results our office will call you or send you a letter guarding the results if any problems please call us.

## 2013-03-13 ENCOUNTER — Encounter: Payer: Self-pay | Admitting: Family Medicine

## 2013-03-13 ENCOUNTER — Ambulatory Visit (INDEPENDENT_AMBULATORY_CARE_PROVIDER_SITE_OTHER): Payer: BC Managed Care – PPO | Admitting: Family Medicine

## 2013-03-13 VITALS — BP 122/70 | Temp 98.3°F | Wt 260.8 lb

## 2013-03-13 DIAGNOSIS — J019 Acute sinusitis, unspecified: Secondary | ICD-10-CM

## 2013-03-13 MED ORDER — LEVOFLOXACIN 500 MG PO TABS: 500.0000 mg | ORAL_TABLET | Freq: Every day | ORAL | Status: AC

## 2013-03-13 NOTE — Patient Instructions (Addendum)
Take all the antibiotics 

## 2013-03-13 NOTE — Progress Notes (Signed)
  Subjective:    Patient ID: [name], male    DOB: [birth year], 57 y.o.   MRN: [id number]  Sinusitis This is a new problem. The current episode started in the past 7 days. The problem has been gradually worsening since onset. The maximum temperature recorded prior to his arrival was 100 - 100.9 F. The fever has been present for 1 to 2 days. His pain is at a severity of 4/10. Associated symptoms include chills, congestion and headaches. Past treatments include acetaminophen. The treatment provided mild relief.   Feels nerves and stress. achey yest  Review of Systems  Constitutional: Positive for chills.  HENT: Positive for congestion.   Neurological: Positive for headaches.   otherwise negative     Objective:   Physical Exam  Alert, mild malaise. Vitals reviewed. Hydration good. Lungs clear. Heart regular rate rhythm. Frontal and maxillary tenderness evident. Positive nasal congestion.      Assessment & Plan:  Impression #1 sinusitis/bronchitis. #2 increased stress. Plan antibiotics prescribed. Since Medicare discussed. Levaquin daily for 10 days. WSL

## 2013-03-14 DIAGNOSIS — Z0289 Encounter for other administrative examinations: Secondary | ICD-10-CM

## 2013-05-01 ENCOUNTER — Encounter: Payer: Self-pay | Admitting: Family Medicine

## 2013-05-01 ENCOUNTER — Telehealth: Payer: Self-pay | Admitting: Family Medicine

## 2013-05-01 NOTE — Telephone Encounter (Signed)
ok 

## 2013-05-01 NOTE — Telephone Encounter (Signed)
Pt is down with his back an needs a work note for 7/16 and 7/17 to return on 7/22 please an thank you

## 2013-05-01 NOTE — Telephone Encounter (Signed)
Done notified patient

## 2013-05-06 ENCOUNTER — Ambulatory Visit (INDEPENDENT_AMBULATORY_CARE_PROVIDER_SITE_OTHER): Payer: BC Managed Care – PPO | Admitting: Family Medicine

## 2013-05-06 ENCOUNTER — Encounter: Payer: Self-pay | Admitting: Family Medicine

## 2013-05-06 VITALS — BP 138/86 | Wt 265.0 lb

## 2013-05-06 DIAGNOSIS — M549 Dorsalgia, unspecified: Secondary | ICD-10-CM

## 2013-05-06 DIAGNOSIS — S39012A Strain of muscle, fascia and tendon of lower back, initial encounter: Secondary | ICD-10-CM

## 2013-05-06 MED ORDER — OXYCODONE-ACETAMINOPHEN 10-325 MG PO TABS: 1.0000 | ORAL_TABLET | ORAL | Status: DC | PRN

## 2013-05-06 MED ORDER — SULFACETAMIDE SODIUM 10 % OP SOLN: 2.0000 [drp] | Freq: Four times a day (QID) | OPHTHALMIC | Status: AC

## 2013-05-06 NOTE — Progress Notes (Signed)
  Subjective:    Patient ID: [name], male    DOB: Sep 14, 1956, 57 y.o.   MRN: [id number]  Back Pain This is a new problem. The current episode started in the past 7 days. The problem occurs 2 to 4 times per day. The problem has been gradually worsening since onset. The pain is present in the lumbar spine. The quality of the pain is described as aching and shooting. The pain does not radiate. The pain is at a severity of 7/10. The pain is mild. The pain is worse during the day. The symptoms are aggravated by position, sitting and twisting. He has tried analgesics and bed rest for the symptoms. The treatment provided mild relief.  Patient arrives with complaint of back pain since last week. Patient relates that the pain is more in the right side of his back radiates around toward the side no rash with it no hematuria or dysuria no vomiting or diarrhea or fevers. Hurts with certain movements. He strained it while working around the house. Unable to go to work. Having to take pain medication more often. He denies abusing it. Patient does have a significant chronic pain component that is not going to get better.  Patient with poor control diabetes. He does not do a good job watching his diet. He does not he always use medicine as directed. He has been educated about this and given information regarding diet but despite this patient still having some issues. He does not smoke he denies any chest tightness pressure pain    Review of Systems  Musculoskeletal: Positive for back pain.       Objective:   Physical Exam Neck no masses lungs are clear no crackles heart is regular. Pulse normal blood pressure good extremities no edema subjective discomfort right side of his back negative straight leg raise       Assessment & Plan:   lumbar/side strain-will take some time to get better but it should get better over the course of next several days no work the next few days pain medication every 4 hours  as needed 3 prescriptions were given. #150 at a time. Percocet 10 mg/325 mg.  Papers were given for him to do his lab work and followup in one month's time to recheck the back plus also to do a diabetic checkup at that point.

## 2013-05-15 ENCOUNTER — Ambulatory Visit (INDEPENDENT_AMBULATORY_CARE_PROVIDER_SITE_OTHER): Payer: BC Managed Care – PPO | Admitting: Family Medicine

## 2013-05-15 ENCOUNTER — Encounter: Payer: Self-pay | Admitting: Family Medicine

## 2013-05-15 VITALS — BP 142/74 | Temp 98.4°F | Wt 256.0 lb

## 2013-05-15 DIAGNOSIS — H5712 Ocular pain, left eye: Secondary | ICD-10-CM

## 2013-05-15 DIAGNOSIS — IMO0002 Reserved for concepts with insufficient information to code with codable children: Secondary | ICD-10-CM

## 2013-05-15 DIAGNOSIS — H5710 Ocular pain, unspecified eye: Secondary | ICD-10-CM

## 2013-05-15 DIAGNOSIS — M1711 Unilateral primary osteoarthritis, right knee: Secondary | ICD-10-CM

## 2013-05-15 DIAGNOSIS — M1710 Unilateral primary osteoarthritis, unspecified knee: Secondary | ICD-10-CM

## 2013-05-15 MED ORDER — DOXYCYCLINE HYCLATE 100 MG PO CAPS: 100.0000 mg | ORAL_CAPSULE | Freq: Two times a day (BID) | ORAL | Status: AC

## 2013-05-15 MED ORDER — INSULIN GLARGINE 100 UNITS/ML SOLOSTAR PEN: 8.0000 [IU] | PEN_INJECTOR | Freq: Every day | SUBCUTANEOUS | Status: DC

## 2013-05-15 NOTE — Progress Notes (Signed)
  Subjective:    Patient ID: [name], male    DOB: 08/16/1956, 57 y.o.   MRN: [id number]  HPIStill having Left eye pain. Was seen last week. Never received prescription eye drops. He has been having clear drainage coming from his eye. Patient was seen last week for back pain and at that time he did have a corneal abrasion related to a foreign body he states he still having some tearing and some intermittent blurring he also relates some intermittent pain with the eye. He denies any new injury. He also has severe osteoarthritis of his knees. He gives him pain he takes his pain medicine on a regular basis. He was hoping at some point in time to get his knees fixed but the orthopedic does not want to do it because of his uncontrolled diabetes. Patient states that he knows he could do a better job with diet and taking his medicines he is open to using insulin.  Larey Seat last week and hurt right knee. It did cause bruising but he states the pain is getting better and is actually going away now.   Check spot on left arm, he states he has a small nodule present that got somewhat inflamed drained a small amount of pus Past medical history family history social history reviewed Review of Systems See above.    Objective:   Physical Exam Lungs are clear no crackles heart regular pulse normal blood pressure good small nodule noted in the left forearm minimal infection Extremities severe osteoarthritis in the knees uses oxycodone for pain Minimal watery eye noted no brain noted on the cornea currently      Assessment & Plan:  #1-diabetes-subpar control start Lantus 8 units each evening come in for education. Once patient gets established on this I believe he can stop the trajenta if the cost is excessive. I did discuss with the patient that if A1c does not get under better control he may need referral to diabetic specialist followup 3 months.  #2-severe osteoarthritis of the knees eventually he needs  knee replacement but first his A1c must get under better control  #3 eye irritation-I. believe this is a slow healing corneal abrasion but there is no signs that he needs any type of surgery but I do feel that the patient should see ophthalmology I told the patient first name he get his sugar under better control when he comes back in 3 months we will set him up with eye doctor  Work status-do to his diabetes being under poor control plus ongoing eye pain I recommend we get his diabetes under better control this will give him a few days to get to the point where he can resume work. [employer] resuming work August 7.

## 2013-05-22 ENCOUNTER — Other Ambulatory Visit: Payer: BC Managed Care – PPO

## 2013-05-23 ENCOUNTER — Ambulatory Visit: Payer: BC Managed Care – PPO | Admitting: Family Medicine

## 2013-05-28 ENCOUNTER — Telehealth: Payer: Self-pay | Admitting: Family Medicine

## 2013-05-28 NOTE — Telephone Encounter (Signed)
Dr Karlton Lemon, on [place] or Dr Nile Riggs

## 2013-05-28 NOTE — Telephone Encounter (Signed)
Left message on voicemail to return call.

## 2013-05-28 NOTE — Telephone Encounter (Signed)
Patient says his eye is still bothering him and he would like to know an eye doctor that you recommend he see.

## 2013-05-31 NOTE — Telephone Encounter (Signed)
Patient stated she has a appt with Dr. Kyung Rudd today.

## 2013-06-05 ENCOUNTER — Telehealth: Payer: Self-pay | Admitting: Family Medicine

## 2013-06-05 ENCOUNTER — Other Ambulatory Visit: Payer: Self-pay

## 2013-06-05 DIAGNOSIS — Z0000 Encounter for general adult medical examination without abnormal findings: Secondary | ICD-10-CM

## 2013-06-05 DIAGNOSIS — E119 Type 2 diabetes mellitus without complications: Secondary | ICD-10-CM

## 2013-06-05 DIAGNOSIS — Z125 Encounter for screening for malignant neoplasm of prostate: Secondary | ICD-10-CM

## 2013-06-05 DIAGNOSIS — Z79899 Other long term (current) drug therapy: Secondary | ICD-10-CM

## 2013-06-05 NOTE — Telephone Encounter (Signed)
Blood work papers ready for pick up at front desk. Left message on voicemail notifying patient.

## 2013-06-05 NOTE — Telephone Encounter (Signed)
Per lab flow sheet- lipid/liver/met 7/ hga1c/urine micro, also if no psa within 11 months plx do

## 2013-06-05 NOTE — Telephone Encounter (Signed)
Patient needs new BW paperwork

## 2013-06-10 ENCOUNTER — Ambulatory Visit: Payer: BC Managed Care – PPO | Admitting: Family Medicine

## 2013-06-11 ENCOUNTER — Ambulatory Visit: Payer: BC Managed Care – PPO | Admitting: Family Medicine

## 2013-06-14 ENCOUNTER — Encounter: Payer: Self-pay | Admitting: Family Medicine

## 2013-06-14 ENCOUNTER — Ambulatory Visit (INDEPENDENT_AMBULATORY_CARE_PROVIDER_SITE_OTHER): Payer: BC Managed Care – PPO | Admitting: Family Medicine

## 2013-06-14 VITALS — BP 122/70 | Temp 98.3°F | Ht 75.0 in | Wt 257.4 lb

## 2013-06-14 DIAGNOSIS — J209 Acute bronchitis, unspecified: Secondary | ICD-10-CM

## 2013-06-14 DIAGNOSIS — Z0289 Encounter for other administrative examinations: Secondary | ICD-10-CM

## 2013-06-14 DIAGNOSIS — R509 Fever, unspecified: Secondary | ICD-10-CM

## 2013-06-14 LAB — HEPATIC FUNCTION PANEL
Albumin: 3.9 g/dL (ref 3.5–5.2)
Alkaline Phosphatase: 108 U/L (ref 39–117)
Bilirubin, Direct: 0.1 mg/dL (ref 0.0–0.3)
Total Bilirubin: 0.5 mg/dL (ref 0.3–1.2)

## 2013-06-14 LAB — BASIC METABOLIC PANEL
CO2: 25 mEq/L (ref 19–32)
Chloride: 100 mEq/L (ref 96–112)
Creat: 0.77 mg/dL (ref 0.50–1.35)
Glucose, Bld: 350 mg/dL — ABNORMAL HIGH (ref 70–99)

## 2013-06-14 LAB — LIPID PANEL
HDL: 29 mg/dL — ABNORMAL LOW (ref >39)
LDL Cholesterol: 109 mg/dL — ABNORMAL HIGH (ref 0–99)

## 2013-06-14 MED ORDER — LEVOFLOXACIN 500 MG PO TABS: 500.0000 mg | ORAL_TABLET | Freq: Every day | ORAL | Status: DC

## 2013-06-14 NOTE — Progress Notes (Signed)
  Subjective:    Patient ID: [name], male    DOB: 1956/01/25, 57 y.o.   MRN: [id number]  HPI Comments: Productive cough with yellow mucous.   Fever  This is a new problem. The current episode started in the past 7 days. The problem occurs intermittently. His temperature was unmeasured prior to arrival. Associated symptoms include congestion, coughing and vomiting. He has tried acetaminophen for the symptoms.   Patient relates fever over the past several days sweats chills feels very fatigued tired relates he has no energy. In addition this has a history of diabetes in subpar control He also states she's been throwing up intermittently the past few days feels very run down   Review of Systems  Constitutional: Positive for fever.  HENT: Positive for congestion.   Respiratory: Positive for cough.   Gastrointestinal: Positive for vomiting.       Objective:   Physical Exam  Lungs are clear coarse cough is noted heart is regular pulse normal blood pressure is good extremities no edema neck no masses eardrums normal throat is normal      Assessment & Plan:  Patient with acute bronchitis as well as febrile illness has severe diabetes has not done a good job of taking care of himself I believe this patient needs Levaquin for the next 10 days he needs to rest the next several days. Hopefully be able to return to work on September 4. Patient recently got his lab work drawn we await the results of this. May need further medications for cholesterol diabetes etc.

## 2013-06-15 LAB — MICROALBUMIN, URINE: Microalb, Ur: 0.85 mg/dL (ref 0.00–1.89)

## 2013-06-15 LAB — PSA: PSA: 0.28 ng/mL (ref <=4.00)

## 2013-06-19 ENCOUNTER — Other Ambulatory Visit: Payer: Self-pay | Admitting: Family Medicine

## 2013-06-19 DIAGNOSIS — E1159 Type 2 diabetes mellitus with other circulatory complications: Secondary | ICD-10-CM

## 2013-06-19 MED ORDER — PRAVASTATIN SODIUM 10 MG PO TABS: 10.0000 mg | ORAL_TABLET | Freq: Every evening | ORAL | Status: DC

## 2013-06-19 NOTE — Addendum Note (Signed)
Addended byOneal Deputy D on: 06/19/2013 08:39 AM   Modules accepted: Orders

## 2013-07-01 ENCOUNTER — Encounter: Payer: Self-pay | Admitting: Family Medicine

## 2013-07-01 ENCOUNTER — Ambulatory Visit (INDEPENDENT_AMBULATORY_CARE_PROVIDER_SITE_OTHER): Payer: BC Managed Care – PPO | Admitting: Family Medicine

## 2013-07-01 VITALS — BP 132/80 | Ht 76.0 in | Wt 252.0 lb

## 2013-07-01 DIAGNOSIS — L02611 Cutaneous abscess of right foot: Secondary | ICD-10-CM

## 2013-07-01 DIAGNOSIS — L8990 Pressure ulcer of unspecified site, unspecified stage: Secondary | ICD-10-CM

## 2013-07-01 DIAGNOSIS — L02619 Cutaneous abscess of unspecified foot: Secondary | ICD-10-CM

## 2013-07-01 MED ORDER — CIPROFLOXACIN HCL 750 MG PO TABS: 750.0000 mg | ORAL_TABLET | Freq: Two times a day (BID) | ORAL | Status: DC

## 2013-07-01 NOTE — Progress Notes (Signed)
  Subjective:    Patient ID: [name], male    DOB: Feb 02, 1956, 57 y.o.   MRN: [id number]  HPIHere for right great toe pain. He is not sure what happened to foot. Pain started about 1.5 weeks ago. He has been taking an antibiotic his wife gave him. He is not sure of the name of the medication. He was walking around in the water with boots on. And he thinks he hit toe on something but not sure what. A1C on august 29 was greater than 10. Insulin was sent in for the patient but he never picked up prescription.  Patient does not do a good job take care of himself. 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 Patient does not currently smoke. Past medical history diabetes hyperlipidemia poor control Family history noncontributory   Review of Systems he denies excessive thirst fever or chills. He did states he had some sweats. No shortness breath or chest pain.     Objective:   Physical Exam Lungs are clear hearts regular Foot exam bilateral calluses on both feet fungal toenails plus also has a darkened great toe. Has some drainage from that area. Consistent with a vascular sore plus infection       Assessment & Plan:  Arterial U/S-we'll do arterial Doppler on the right leg I expect peripheral vascular disease Toe infection- Cipro 750 bid 15 days, he'll followup in approximately 2 weeks. Followup sooner if worse keep the toe dry as possible Vasc referral is more than likely necessary. I would recommend we await the results of the ultrasound first Diabetes he's already been referred to endocrinology I hope that they can work with him to get his diabetes under better control

## 2013-07-04 LAB — WOUND CULTURE: Gram Stain: NONE SEEN

## 2013-07-08 ENCOUNTER — Telehealth: Payer: Self-pay | Admitting: Family Medicine

## 2013-07-08 NOTE — Telephone Encounter (Signed)
He may have a note for both. Please create the notes thewn I can sign them . thanks

## 2013-07-08 NOTE — Telephone Encounter (Signed)
Needs a note for work 07/04/2013 and 07/05/2013 out due to Ulcer on foot.  Also needs a note on Office letter head stating he was out of work June 18, 2013 through June 24, 2013 this is for his [employer].  Please call patient when this is ready to be picked up.

## 2013-07-08 NOTE — Telephone Encounter (Signed)
Both notes are ready for [name] to pick up.  Patient notified.

## 2013-07-09 ENCOUNTER — Ambulatory Visit (HOSPITAL_COMMUNITY): Payer: BC Managed Care – PPO

## 2013-07-10 ENCOUNTER — Ambulatory Visit (HOSPITAL_COMMUNITY): Payer: BC Managed Care – PPO

## 2013-07-15 ENCOUNTER — Ambulatory Visit (HOSPITAL_COMMUNITY)
Admission: RE | Admit: 2013-07-15 | Discharge: 2013-07-15 | Disposition: A | Discharge status: Home / Self Care | Payer: BC Managed Care – PPO | Source: Ambulatory Visit | Attending: Family Medicine | Admitting: Family Medicine

## 2013-07-15 DIAGNOSIS — L97909 Non-pressure chronic ulcer of unspecified part of unspecified lower leg with unspecified severity: Secondary | ICD-10-CM

## 2013-07-15 DIAGNOSIS — L97509 Non-pressure chronic ulcer of other part of unspecified foot with unspecified severity: Secondary | ICD-10-CM | POA: Insufficient documentation

## 2013-07-15 DIAGNOSIS — E119 Type 2 diabetes mellitus without complications: Secondary | ICD-10-CM | POA: Insufficient documentation

## 2013-07-15 DIAGNOSIS — L8990 Pressure ulcer of unspecified site, unspecified stage: Secondary | ICD-10-CM

## 2013-07-15 NOTE — Progress Notes (Signed)
VASCULAR LAB PRELIMINARY  ARTERIAL  ABI completed:  Right ABI indicates mild reduction in arterial blood flow.  Left: ABI is WNL.    RIGHT    LEFT    PRESSURE WAVEFORM  PRESSURE WAVEFORM  BRACHIAL 122 T BRACHIAL 127 T  DP 111 B DP 152 B  AT   AT    PT 107 B PT 93 B  PER   PER    GREAT TOE  NA GREAT TOE  NA    RIGHT LEFT  ABI 0.87 1.20     Raeleigh Guinn, RVT 07/15/2013, 11:05 AM

## 2013-07-22 ENCOUNTER — Ambulatory Visit (INDEPENDENT_AMBULATORY_CARE_PROVIDER_SITE_OTHER): Payer: BC Managed Care – PPO | Admitting: Family Medicine

## 2013-07-22 ENCOUNTER — Encounter: Payer: Self-pay | Admitting: Family Medicine

## 2013-07-22 VITALS — BP 138/88 | Ht 74.0 in | Wt 257.8 lb

## 2013-07-22 DIAGNOSIS — E1165 Type 2 diabetes mellitus with hyperglycemia: Secondary | ICD-10-CM

## 2013-07-22 DIAGNOSIS — E1169 Type 2 diabetes mellitus with other specified complication: Secondary | ICD-10-CM

## 2013-07-22 DIAGNOSIS — L97509 Non-pressure chronic ulcer of other part of unspecified foot with unspecified severity: Secondary | ICD-10-CM

## 2013-07-22 DIAGNOSIS — E11621 Type 2 diabetes mellitus with foot ulcer: Secondary | ICD-10-CM

## 2013-07-22 MED ORDER — OXYCODONE-ACETAMINOPHEN 10-325 MG PO TABS: 1.0000 | ORAL_TABLET | ORAL | Status: DC | PRN

## 2013-07-22 NOTE — Progress Notes (Signed)
  Subjective:    Patient ID: [name], male    DOB: 27-Apr-1956, 57 y.o.   MRN: [id number]  HPI Patient arrives for follow up on sore on right big toe. States it is doing much better. He denies shortness of breath chest tightness pressure pain has history diabetes poor compliance and poor control in the past patient also has significant arthritis Review of Systems He relates blurred vision   he admits to being noncompliant with his medicine Objective:   Physical Exam Lungs are clear hearts regular abdomen soft obese he does have diabetic foot ulcer on the right great toe slight drainage not as red as what it was swelling moderately down compared to what it was.       Assessment & Plan:  #1 poor diabetic control-we have tried multiple ways through the years to try to help this gentleman. His poor compliance gets in the way of helping himself. His family and he had met with the dietitian in the past. In addition to this we even tried to initiate insulin but he did not get it filled. He is currently using glipizide twice daily I will set him up with endocrinologists. I told the patient that he is facing significant health problems and premature death if he doesn't do a better job  #2 diabetic foot ulcer referral to vascular surgeon low ABI on the right side  #3 this patient is very likable I am hoping that he will grasp the importance of getting things under better control

## 2013-07-23 ENCOUNTER — Other Ambulatory Visit: Payer: Self-pay | Admitting: Family Medicine

## 2013-07-30 ENCOUNTER — Other Ambulatory Visit: Payer: Self-pay | Admitting: Vascular Surgery

## 2013-07-30 ENCOUNTER — Telehealth: Payer: Self-pay | Admitting: Family Medicine

## 2013-07-30 DIAGNOSIS — L97909 Non-pressure chronic ulcer of unspecified part of unspecified lower leg with unspecified severity: Secondary | ICD-10-CM

## 2013-07-30 NOTE — Telephone Encounter (Signed)
FYI - Pt is scheduled with Dr. Fransico Him for 08/08/13, their office spoke with pt to schedule and pt is scheduled to see Dr. Imogene Burn (Vascular) 08/09/13, their office spoke with pt to schedule

## 2013-08-02 ENCOUNTER — Telehealth: Payer: Self-pay | Admitting: Family Medicine

## 2013-08-02 ENCOUNTER — Encounter: Payer: Self-pay | Admitting: Family Medicine

## 2013-08-02 NOTE — Telephone Encounter (Signed)
Please give for 07/31/13

## 2013-08-02 NOTE — Telephone Encounter (Signed)
Note up front for patient to pick-patient notified on voicemail

## 2013-08-02 NOTE — Telephone Encounter (Signed)
Patient needs a doctors note for 07/31/13 due to diabetic blister on foot.

## 2013-08-08 ENCOUNTER — Encounter: Payer: Self-pay | Admitting: Vascular Surgery

## 2013-08-09 ENCOUNTER — Encounter: Payer: BC Managed Care – PPO | Admitting: Vascular Surgery

## 2013-08-09 ENCOUNTER — Other Ambulatory Visit (HOSPITAL_COMMUNITY): Payer: BC Managed Care – PPO

## 2013-08-13 ENCOUNTER — Other Ambulatory Visit: Payer: BC Managed Care – PPO | Admitting: *Deleted

## 2013-08-13 ENCOUNTER — Other Ambulatory Visit: Payer: Self-pay | Admitting: Family Medicine

## 2013-08-13 DIAGNOSIS — E1159 Type 2 diabetes mellitus with other circulatory complications: Secondary | ICD-10-CM

## 2013-08-13 NOTE — Progress Notes (Signed)
Showed family how to use there new glucose meter. Family able to demonstrate skill.

## 2013-08-26 ENCOUNTER — Encounter: Payer: Self-pay | Admitting: Vascular Surgery

## 2013-08-26 ENCOUNTER — Encounter: Payer: Self-pay | Admitting: Family Medicine

## 2013-08-26 ENCOUNTER — Ambulatory Visit (INDEPENDENT_AMBULATORY_CARE_PROVIDER_SITE_OTHER): Payer: BC Managed Care – PPO | Admitting: Family Medicine

## 2013-08-26 VITALS — BP 130/72 | Ht 75.0 in | Wt 261.0 lb

## 2013-08-26 DIAGNOSIS — D492 Neoplasm of unspecified behavior of bone, soft tissue, and skin: Secondary | ICD-10-CM

## 2013-08-26 DIAGNOSIS — R229 Localized swelling, mass and lump, unspecified: Secondary | ICD-10-CM

## 2013-08-26 DIAGNOSIS — Z23 Encounter for immunization: Secondary | ICD-10-CM

## 2013-08-26 DIAGNOSIS — Z79899 Other long term (current) drug therapy: Secondary | ICD-10-CM

## 2013-08-26 DIAGNOSIS — E785 Hyperlipidemia, unspecified: Secondary | ICD-10-CM

## 2013-08-26 MED ORDER — OXYCODONE-ACETAMINOPHEN 10-325 MG PO TABS: 1.0000 | ORAL_TABLET | ORAL | Status: DC | PRN

## 2013-08-26 MED ORDER — ROSUVASTATIN CALCIUM 5 MG PO TABS: 5.0000 mg | ORAL_TABLET | Freq: Every day | ORAL | Status: DC

## 2013-08-26 NOTE — Patient Instructions (Signed)
Crestor 5 mg - start at one three times a week if tolerated then increase to 5 days a week then if still tolerated then go to 1 per day  If can not tolerate then stop it  Recheck cholesterol and liver in 8 weeks

## 2013-08-26 NOTE — Progress Notes (Addendum)
  Subjective:    Patient ID: [name], male    DOB: [birth year], 57 y.o.   MRN: [id number]  HPIHere for a check up. He sees Dr. Fransico Him for his Diabetes. He relates he is trying to take diabetes more serious. He denies any concerns currently with this  Check spot on left arm. He does relate that he has area on his left arm it seems to be getting bigger larger redder he is now at the point where he thinks there's something wrong with that in his rectum bending for [place] to check it out. Patient denies any pain but it does bleed sometimes.  Recheck diabetic blister on right toe. Still on antibiotic.  Patient will be seen vascular Dr. Kathrynn Running up. The area on his toes seems to be doing better with less drainage according to the patient.  Patient also needs pain medication for his chronic pain and discomfort he has in his lumbar spine as well as bilateral osteoarthritis. He uses Percocet he denies abusing it.  Past medical history reviewed and see problem list patient does not smoke family history reviewed  Patient also relates that he had muscle pains with pravastatin. He is wanting a different medication. He is trying Zocor or Lipitor pravastatin all of causing muscle soreness  Review of Systems  Constitutional: Negative for fever, activity change, appetite change and fatigue.  HENT: Negative for sinus pressure and sneezing.   Respiratory: Negative for cough and chest tightness.   Cardiovascular: Negative for chest pain.  Gastrointestinal: Negative for abdominal pain and abdominal distention.  Musculoskeletal: Positive for back pain. Negative for myalgias.       Objective:   Physical Exam  Vitals reviewed. Constitutional: He appears well-nourished.  HENT:  Head: Normocephalic.  Right Ear: External ear normal.  Left Ear: External ear normal.  Mouth/Throat: No oropharyngeal exudate.  Neck: Neck supple.  Cardiovascular: Normal rate, regular rhythm and normal heart sounds.  Exam reveals no  gallop.   No murmur heard. Pulmonary/Chest: Effort normal and breath sounds normal. No respiratory distress.  Abdominal: Soft.  Musculoskeletal: He exhibits no edema.  Lymphadenopathy:    He has no cervical adenopathy.          Assessment & Plan:  #1 HTN decent control #2 chronic back pain 3 prescription pain medicine written patient does not abuse it he needs to followup on regular basis see back in 3 months #3 peripheral vascular disease see vascular specialist later this week if diabetic foot ulcer gets worse followup immediately #4 patient with lesion on the left arm referral to dermatology for removal #5 hyperlipidemia start Crestor 5 mg, use Monday Wednesday Friday to begin with 2 weeks later if tolerating move up to 5 days a week 2 weeks later if tolerating the daily recheck lipid liver and 8 weeks, recommend followup office visit in 3 months. If unable to tolerate medicine notify us. Patient agrees with rationale

## 2013-08-27 ENCOUNTER — Other Ambulatory Visit (HOSPITAL_COMMUNITY): Payer: BC Managed Care – PPO

## 2013-08-27 ENCOUNTER — Encounter: Payer: BC Managed Care – PPO | Admitting: Vascular Surgery

## 2013-08-28 ENCOUNTER — Ambulatory Visit: Payer: BC Managed Care – PPO | Admitting: Family Medicine

## 2013-09-02 ENCOUNTER — Telehealth: Payer: Self-pay | Admitting: Family Medicine

## 2013-09-02 NOTE — Telephone Encounter (Signed)
Patient needs Rx for [facility] needles only  [facility]

## 2013-09-02 NOTE — Telephone Encounter (Signed)
Patient states he will have his wife return call to clarify what is needed.

## 2013-11-14 ENCOUNTER — Telehealth: Payer: Self-pay | Admitting: Family Medicine

## 2013-11-14 DIAGNOSIS — Z79899 Other long term (current) drug therapy: Secondary | ICD-10-CM

## 2013-11-14 DIAGNOSIS — E785 Hyperlipidemia, unspecified: Secondary | ICD-10-CM

## 2013-11-14 NOTE — Telephone Encounter (Signed)
Patient has a lipid and liver done on 08/26/13. Do you want to repeat this?

## 2013-11-14 NOTE — Telephone Encounter (Signed)
Patient needs blood work papers has 3 mth followup visit on the 12th  Of Feb.

## 2013-11-14 NOTE — Telephone Encounter (Signed)
Notified patient bloodwork was ordered and can report to lab.

## 2013-11-14 NOTE — Addendum Note (Signed)
Addended by: Jesusita Oka on: 11/14/2013 11:57 AM   Modules accepted: Orders

## 2013-11-14 NOTE — Telephone Encounter (Signed)
Lipid, liver, metabolic 7. It appears patient never got it done back in November. His A1c is being followed by the endocrinologist.

## 2013-11-17 LAB — HM DIABETES EYE EXAM

## 2013-11-21 ENCOUNTER — Telehealth: Payer: Self-pay | Admitting: Family Medicine

## 2013-11-21 NOTE — Telephone Encounter (Signed)
Patient calling to check and see if FMLA papers were ever faxed to his job yesterday. He said Dr Nicki Reaper was going to fill them out at lunch and fax.

## 2013-11-21 NOTE — Telephone Encounter (Signed)
Notified patient.

## 2013-11-26 LAB — BASIC METABOLIC PANEL
BUN: 12 mg/dL (ref 6–23)
CHLORIDE: 102 meq/L (ref 96–112)
CO2: 29 meq/L (ref 19–32)
Calcium: 9 mg/dL (ref 8.4–10.5)
Creat: 0.66 mg/dL (ref 0.50–1.35)
Glucose, Bld: 163 mg/dL — ABNORMAL HIGH (ref 70–99)
Potassium: 4.7 mEq/L (ref 3.5–5.3)
SODIUM: 139 meq/L (ref 135–145)

## 2013-11-26 LAB — LIPID PANEL
CHOL/HDL RATIO: 4.2 ratio
Cholesterol: 129 mg/dL (ref 0–200)
HDL: 31 mg/dL — ABNORMAL LOW (ref >39)
LDL Cholesterol: 84 mg/dL (ref 0–99)
Triglycerides: 69 mg/dL (ref <150)
VLDL: 14 mg/dL (ref 0–40)

## 2013-11-26 LAB — HEPATIC FUNCTION PANEL
ALT: 13 U/L (ref 0–53)
AST: 13 U/L (ref 0–37)
Albumin: 3.9 g/dL (ref 3.5–5.2)
Alkaline Phosphatase: 72 U/L (ref 39–117)
BILIRUBIN TOTAL: 0.5 mg/dL (ref 0.2–1.2)
Bilirubin, Direct: 0.1 mg/dL (ref 0.0–0.3)
Indirect Bilirubin: 0.4 mg/dL (ref 0.2–1.2)
Total Protein: 6.4 g/dL (ref 6.0–8.3)

## 2013-11-28 ENCOUNTER — Encounter: Payer: Self-pay | Admitting: Family Medicine

## 2013-11-28 ENCOUNTER — Ambulatory Visit (INDEPENDENT_AMBULATORY_CARE_PROVIDER_SITE_OTHER): Payer: BC Managed Care – PPO | Admitting: Family Medicine

## 2013-11-28 VITALS — BP 122/72 | Ht 75.0 in | Wt 260.0 lb

## 2013-11-28 DIAGNOSIS — I2510 Atherosclerotic heart disease of native coronary artery without angina pectoris: Secondary | ICD-10-CM

## 2013-11-28 DIAGNOSIS — E1165 Type 2 diabetes mellitus with hyperglycemia: Secondary | ICD-10-CM

## 2013-11-28 DIAGNOSIS — I739 Peripheral vascular disease, unspecified: Secondary | ICD-10-CM

## 2013-11-28 DIAGNOSIS — E785 Hyperlipidemia, unspecified: Secondary | ICD-10-CM | POA: Insufficient documentation

## 2013-11-28 DIAGNOSIS — L98499 Non-pressure chronic ulcer of skin of other sites with unspecified severity: Secondary | ICD-10-CM

## 2013-11-28 DIAGNOSIS — E782 Mixed hyperlipidemia: Secondary | ICD-10-CM | POA: Insufficient documentation

## 2013-11-28 DIAGNOSIS — E1149 Type 2 diabetes mellitus with other diabetic neurological complication: Secondary | ICD-10-CM

## 2013-11-28 DIAGNOSIS — I70209 Unspecified atherosclerosis of native arteries of extremities, unspecified extremity: Secondary | ICD-10-CM

## 2013-11-28 DIAGNOSIS — E118 Type 2 diabetes mellitus with unspecified complications: Principal | ICD-10-CM

## 2013-11-28 DIAGNOSIS — IMO0002 Reserved for concepts with insufficient information to code with codable children: Secondary | ICD-10-CM

## 2013-11-28 DIAGNOSIS — G894 Chronic pain syndrome: Secondary | ICD-10-CM

## 2013-11-28 DIAGNOSIS — E1142 Type 2 diabetes mellitus with diabetic polyneuropathy: Secondary | ICD-10-CM

## 2013-11-28 LAB — POCT GLYCOSYLATED HEMOGLOBIN (HGB A1C): Hemoglobin A1C: 8.9

## 2013-11-28 MED ORDER — OXYCODONE-ACETAMINOPHEN 10-325 MG PO TABS: 1.0000 | ORAL_TABLET | ORAL | Status: DC | PRN

## 2013-11-28 NOTE — Progress Notes (Signed)
   Subjective:    Patient ID: [name], male    DOB: Apr 17, 1956, 58 y.o.   MRN: [id number]  Diabetes He presents for his follow-up diabetic visit. He has type 2 diabetes mellitus. His disease course has been stable. There are no hypoglycemic associated symptoms. Pertinent negatives for hypoglycemia include no confusion. Pertinent negatives for diabetes include no chest pain, no fatigue, no polydipsia, no polyphagia and no weakness. Diabetic complications include autonomic neuropathy, heart disease, nephropathy and peripheral neuropathy. Pertinent negatives for diabetic complications include no CVA. Risk factors for coronary artery disease include diabetes mellitus and dyslipidemia.    Patient is here today for diabetic check up. He states his blood sugars have been stable.  Also needs a refill on pain medication.  This patient was seen today for chronic pain  The medication list was reviewed and updated.  Discussion was held with the patient regarding compliance with pain medication. The patient was advised the importance of maintaining medication and not using illegal substances with these. The patient was educated that we can provide 3 monthly scripts for their medication, it is their responsibility to follow the instructions. Discussion was held with the patient to make sure they're not having significant side effects. Patient is aware that pain medications are meant to minimize the severity of the pain to allow their pain levels to improve to allow for better function. They are aware of that pain medications cannot totally remove their pain.      Review of Systems  Constitutional: Negative for activity change, appetite change and fatigue.  HENT: Negative for congestion.   Respiratory: Negative for cough and shortness of breath.   Cardiovascular: Negative for chest pain.  Gastrointestinal: Negative for abdominal pain.  Endocrine: Negative for polydipsia and polyphagia.    Musculoskeletal: Positive for arthralgias and back pain.  Neurological: Negative for weakness.  Psychiatric/Behavioral: Negative for confusion.       Objective:   Physical Exam  Vitals reviewed. Constitutional: He appears well-nourished. No distress.  Cardiovascular: Normal rate, regular rhythm and normal heart sounds.   No murmur heard. Pulmonary/Chest: Effort normal and breath sounds normal. No respiratory distress.  Musculoskeletal: He exhibits no edema.  Lymphadenopathy:    He has no cervical adenopathy.  Neurological: He is alert.  Psychiatric: His behavior is normal.     please see diabetic foot checkup information/examination notice.      Assessment & Plan:  #1 patient with severe peripheral vascular disease. Patient had seen vascular surgeon in October he was scheduled to go back 3 months later he did not followup. Patient states that he will call and set up followup with them.  #2 diabetes better control than what it was but still needs improvement we talked about the importance of watching diet taking medication and intermittently following sugars he still sees diabetic specialist once again some progress  #3 heart disease stable hypertension stable  #4 chronic pain patient has severe chronic pain in his back and his knees hurts with movement hurts with activities. He also gets pain when he is even at rest. This is cause significant troubles. He may use Percocet up to 5 times a day 3 prescriptions were written.  #5 he is under stress currently he is trying to get custody of his grandkids on a regular basis his daughter has significant psychiatric issues for which he may have to commit her for

## 2013-11-29 DIAGNOSIS — Z0289 Encounter for other administrative examinations: Secondary | ICD-10-CM

## 2014-02-03 ENCOUNTER — Telehealth: Payer: Self-pay | Admitting: Family Medicine

## 2014-02-03 MED ORDER — ROSUVASTATIN CALCIUM 5 MG PO TABS: 5.0000 mg | ORAL_TABLET | Freq: Every day | ORAL | Status: DC

## 2014-02-03 NOTE — Telephone Encounter (Signed)
Done

## 2014-02-03 NOTE — Telephone Encounter (Signed)
Patient needs Rx for crestor 5 mg tablet

## 2014-02-04 ENCOUNTER — Telehealth: Payer: Self-pay | Admitting: Family Medicine

## 2014-02-04 NOTE — Telephone Encounter (Signed)
Patient needs Rx for crestor 5 mg tab 90 day

## 2014-02-04 NOTE — Telephone Encounter (Signed)
Was done yesterday

## 2014-02-10 ENCOUNTER — Telehealth: Payer: Self-pay | Admitting: Family Medicine

## 2014-02-10 NOTE — Telephone Encounter (Signed)
Please give work excuse as asked for

## 2014-02-10 NOTE — Telephone Encounter (Signed)
Notified patient work excuse done.

## 2014-02-10 NOTE — Telephone Encounter (Signed)
Patient needs a work excuse to cover him for 02/06/14 due to his knee bothering him to return 02/07/14.

## 2014-02-28 ENCOUNTER — Telehealth: Payer: Self-pay | Admitting: Family Medicine

## 2014-02-28 MED ORDER — OXYCODONE-ACETAMINOPHEN 10-325 MG PO TABS: 1.0000 | ORAL_TABLET | ORAL | Status: DC | PRN

## 2014-02-28 NOTE — Telephone Encounter (Signed)
One q 4 prn pain , #60, needs ov

## 2014-02-28 NOTE — Telephone Encounter (Signed)
oxyCODONE-acetaminophen (PERCOCET) 10-325 MG per tablet  Pt is going to be out this weekend, thought he could get in today or Monday But had to inform pt that in the future he would need to call at least a week ahead  Of time to make an appt from here on out for his med checks. He made one for 5/27 in the PM  He wants to know if you can give him enough to get him through till the 27th?   He also wants to know if you can do it today while his grandson is here for a sick appt today

## 2014-02-28 NOTE — Telephone Encounter (Signed)
Dr. Nicki Reaper handed to pt's wife

## 2014-03-02 NOTE — Telephone Encounter (Signed)
Patient is aware to make office visit

## 2014-03-12 ENCOUNTER — Ambulatory Visit (INDEPENDENT_AMBULATORY_CARE_PROVIDER_SITE_OTHER): Payer: BC Managed Care – PPO | Admitting: Family Medicine

## 2014-03-12 ENCOUNTER — Encounter: Payer: Self-pay | Admitting: Family Medicine

## 2014-03-12 VITALS — BP 112/70 | Ht 75.0 in | Wt 273.0 lb

## 2014-03-12 DIAGNOSIS — M170 Bilateral primary osteoarthritis of knee: Secondary | ICD-10-CM | POA: Insufficient documentation

## 2014-03-12 DIAGNOSIS — E119 Type 2 diabetes mellitus without complications: Secondary | ICD-10-CM

## 2014-03-12 DIAGNOSIS — IMO0002 Reserved for concepts with insufficient information to code with codable children: Secondary | ICD-10-CM

## 2014-03-12 DIAGNOSIS — E785 Hyperlipidemia, unspecified: Secondary | ICD-10-CM

## 2014-03-12 DIAGNOSIS — M1710 Unilateral primary osteoarthritis, unspecified knee: Secondary | ICD-10-CM

## 2014-03-12 DIAGNOSIS — G894 Chronic pain syndrome: Secondary | ICD-10-CM

## 2014-03-12 LAB — POCT GLYCOSYLATED HEMOGLOBIN (HGB A1C): Hemoglobin A1C: 8.5

## 2014-03-12 MED ORDER — OXYCODONE-ACETAMINOPHEN 10-325 MG PO TABS: 1.0000 | ORAL_TABLET | ORAL | Status: DC | PRN

## 2014-03-12 NOTE — Patient Instructions (Signed)
Night insulin increase it to 14 units  Goal see the glucose 100 to 120

## 2014-03-12 NOTE — Progress Notes (Signed)
   Subjective:    Patient ID: [name], male    DOB: [birth year], 58 y.o.   MRN: [id number]  Diabetes He presents for his follow-up diabetic visit. He has type 2 diabetes mellitus. His disease course has been stable. There are no hypoglycemic associated symptoms. Pertinent negatives for hypoglycemia include no confusion. There are no diabetic associated symptoms. Pertinent negatives for diabetes include no chest pain, no fatigue, no polydipsia, no polyphagia and no weakness. There are no hypoglycemic complications. Symptoms are stable. There are no diabetic complications. There are no known risk factors for coronary artery disease. Current diabetic treatment includes insulin injections and oral agent (monotherapy). He is compliant with treatment all of the time.   A1C today is 8.5. The patient was seen today as part of a comprehensive diabetic check up. The patient had the following elements completed: -Review of medication compliance -Review of glucose monitoring results -Review of any complications do to high or low sugars -Diabetic foot exam was completed as part of today's visit. The following was also discussed: -Importance of yearly eye exams -Importance of following diabetic/low sugar-starch diet -Importance of exercise and regular activity -Importance of regular followup visits. -Most recent hemoglobin A1c were reviewed with the patient along with goals regarding diabetes.  Right foot pain with walking, hard to stand bcz of pain Wakes up at night due to knee pain  Patient wants to discuss the behavior issues he is having with his 30 year old grandson with Dr. Nicki Reaper.  Patient states that he has no other concerns at this time.   Review of Systems  Constitutional: Negative for activity change, appetite change and fatigue.  HENT: Negative for congestion.   Respiratory: Negative for cough.   Cardiovascular: Negative for chest pain.  Gastrointestinal: Negative for abdominal pain.   Endocrine: Negative for polydipsia and polyphagia.  Neurological: Negative for weakness.  Psychiatric/Behavioral: Negative for confusion.       Objective:   Physical Exam  Vitals reviewed. Constitutional: He appears well-nourished. No distress.  Cardiovascular: Normal rate, regular rhythm and normal heart sounds.   No murmur heard. Pulmonary/Chest: Effort normal and breath sounds normal. No respiratory distress.  Musculoskeletal: He exhibits no edema.  Lymphadenopathy:    He has no cervical adenopathy.  Neurological: He is alert.  Psychiatric: His behavior is normal.          Assessment & Plan:  #1 poor diabetes control. We adjusted up on the evening Lantus. Watch diet closely. Followup A1c in 3 months.  #2 significant peripheral neuropathy patient warned about going barefoot he was also warned about foot ulcers and followup immediately  #3 patient becoming more debilitated he has significant osteoarthritis of the knees plus also discomfort in his feet when he walks. It is quite possible this patient get to the point where they're no longer be able to work. Currently he has difficult time going up and down steps and keeping up on his job.  #4 chronic pain discomfort from osteoarthritis as well as peripheral neuropathy pain medication prescriptions given 3 scripts. Followup in 3 months  #5 hyperlipidemia at increased risk of heart attack LC diet regular physical activity recommended. Do lab work in the fall  #6 significant home stress with a grandson who lives quite difficult to control they will bring him in next week for further discussion  Followup 3 months lab work in the fall

## 2014-03-20 ENCOUNTER — Encounter: Payer: Self-pay | Admitting: Family Medicine

## 2014-05-26 ENCOUNTER — Telehealth: Payer: Self-pay | Admitting: Family Medicine

## 2014-05-26 NOTE — Telephone Encounter (Signed)
Patient called this morning about FMLA paper on grandson that he wants you to fill out but his grandson is seeing a specialist for his behavior issues and I explained to patient that the specialist needs to fill out papers due to certain questions they will be asking for that our office cant answer because we didn't see him for that reason and that our office couldn't sign off on that because you didn't see him and if they needed records I cant provide those from another office.Patient has appointment on the 24th of this month to talk with you about his grandson and he was last seen in June, just wanted you be be aware of this.

## 2014-06-09 ENCOUNTER — Ambulatory Visit (INDEPENDENT_AMBULATORY_CARE_PROVIDER_SITE_OTHER): Payer: BC Managed Care – PPO | Admitting: Family Medicine

## 2014-06-09 ENCOUNTER — Encounter: Payer: Self-pay | Admitting: Family Medicine

## 2014-06-09 VITALS — BP 138/80 | Ht 75.0 in | Wt 271.0 lb

## 2014-06-09 DIAGNOSIS — Z79899 Other long term (current) drug therapy: Secondary | ICD-10-CM

## 2014-06-09 DIAGNOSIS — E1149 Type 2 diabetes mellitus with other diabetic neurological complication: Secondary | ICD-10-CM

## 2014-06-09 DIAGNOSIS — IMO0002 Reserved for concepts with insufficient information to code with codable children: Secondary | ICD-10-CM

## 2014-06-09 DIAGNOSIS — E1165 Type 2 diabetes mellitus with hyperglycemia: Secondary | ICD-10-CM

## 2014-06-09 DIAGNOSIS — E118 Type 2 diabetes mellitus with unspecified complications: Secondary | ICD-10-CM

## 2014-06-09 DIAGNOSIS — E1142 Type 2 diabetes mellitus with diabetic polyneuropathy: Secondary | ICD-10-CM

## 2014-06-09 DIAGNOSIS — E785 Hyperlipidemia, unspecified: Secondary | ICD-10-CM

## 2014-06-09 DIAGNOSIS — G894 Chronic pain syndrome: Secondary | ICD-10-CM

## 2014-06-09 MED ORDER — ROSUVASTATIN CALCIUM 5 MG PO TABS: 5.0000 mg | ORAL_TABLET | Freq: Every day | ORAL | Status: DC

## 2014-06-09 MED ORDER — HALOBETASOL PROPIONATE 0.05 % EX OINT: TOPICAL_OINTMENT | Freq: Two times a day (BID) | CUTANEOUS | Status: DC

## 2014-06-09 MED ORDER — INSULIN GLARGINE 100 UNITS/ML SOLOSTAR PEN: PEN_INJECTOR | SUBCUTANEOUS | Status: DC

## 2014-06-09 MED ORDER — LINAGLIPTIN 5 MG PO TABS: 5.0000 mg | ORAL_TABLET | Freq: Every day | ORAL | Status: DC

## 2014-06-09 NOTE — Progress Notes (Signed)
   Subjective:    Patient ID: [name], male    DOB: [birth year], 58 y.o.   MRN: [id number]  HPI This patient was seen today for chronic pain  The medication list was reviewed and updated.   -Compliance with pain medication: yes  The patient was advised the importance of maintaining medication and not using illegal substances with these.  Refills needed: yes on oxycodone  The patient was educated that we can provide 3 monthly scripts for their medication, it is their responsibility to follow the instructions.  Side effects or complications from medications: none  Patient is aware that pain medications are meant to minimize the severity of the pain to allow their pain levels to improve to allow for better function. They are aware of that pain medications cannot totally remove their pain.  Due for UDT ( at least once per year) : this fall  Requesting refill on Tradjenta 5mg  one a day. Pt states he has not seen diabetic specialist since march. Pt states he is not going back to see specialist. Last a1c was on may 27th. 8.5.   Pt states he does not take crestor every day because it makes his muscle aches.         Review of Systems Denies chest pain nausea vomiting diarrhea relates joint pain and discomfort.    Objective:   Physical Exam  Lungs are clear no crackles respiratory rate is normal heart is regular pulses normal abdomen soft extremities no edema diabetic foot exam completed HEENT benign      Assessment & Plan:  #1 chronic pain-prescriptions were written patient does uses medicine for back pain and knee pain he does not use more than 5 per day 3 prescriptions given followup 3 months  #2 diabetes patient not knowing the specialist currently we will refill his medicines I coached him how to adjust his long-acting insulin. Watch diet stay physically active.  #3 hyperlipidemia to do lab work, he stated he would try to take his cholesterol medicine daily I really feel  that his joint discomfort is osteoarthritis not his cholesterol  #4 patient also did be unremarkable protein as well as metabolic 7 and Z1I

## 2014-07-08 ENCOUNTER — Encounter: Payer: Self-pay | Admitting: *Deleted

## 2014-07-08 LAB — HEMOGLOBIN A1C: A1C: 8.5

## 2014-07-14 ENCOUNTER — Telehealth: Payer: Self-pay | Admitting: Family Medicine

## 2014-07-14 DIAGNOSIS — S90829A Blister (nonthermal), unspecified foot, initial encounter: Secondary | ICD-10-CM

## 2014-07-14 NOTE — Telephone Encounter (Signed)
Patient said that he has a blood blister type sore on the bottom of his foot. He has been seen by our office in the past and we have instructed him to go see a podiatrist. He would now like a referral to go see a podiatrist.

## 2014-07-14 NOTE — Telephone Encounter (Signed)
Please was seeing podiatry. Go ahead and put the referral in. If the patient feels he needs to be seen by [place] quickly then we can see him today.

## 2014-07-14 NOTE — Telephone Encounter (Signed)
Left message on voicemail notifying patient that referral was initiated and to call us if he needs to be seen before then.

## 2014-07-18 ENCOUNTER — Telehealth: Payer: Self-pay | Admitting: Family Medicine

## 2014-07-18 DIAGNOSIS — E1142 Type 2 diabetes mellitus with diabetic polyneuropathy: Secondary | ICD-10-CM

## 2014-07-18 MED ORDER — AMOXICILLIN-POT CLAVULANATE 875-125 MG PO TABS: 1.0000 | ORAL_TABLET | Freq: Two times a day (BID) | ORAL | Status: AC

## 2014-07-18 NOTE — Telephone Encounter (Signed)
Aug 875 bid ten d if not allergic, and, vasc sur ref apparently initially recommended by dr Nicki Reaper

## 2014-07-18 NOTE — Telephone Encounter (Signed)
1) Pt calling to say he went to see the foot specialist yesterday and he took a great  Deal of skin/tissue away. Pt asked him if he needed to be on antibiotics and was told  No that there was no infection present at this time.  Pt wants to know if you think he Should go ahead a be put on some for precaution? And can you call [facility] in some to  [facility] if you think he does?   2) he would like to move forward with seeing the vascular doc as you recommended  And the foot doc has now recommended

## 2014-07-18 NOTE — Telephone Encounter (Signed)
Notified patient antibiotic was sent to pharmacy and referral was put in the system.

## 2014-07-28 ENCOUNTER — Other Ambulatory Visit: Payer: Self-pay | Admitting: *Deleted

## 2014-07-28 DIAGNOSIS — M79604 Pain in right leg: Secondary | ICD-10-CM

## 2014-07-28 DIAGNOSIS — I739 Peripheral vascular disease, unspecified: Secondary | ICD-10-CM

## 2014-08-05 ENCOUNTER — Encounter: Payer: Self-pay | Admitting: Vascular Surgery

## 2014-08-06 ENCOUNTER — Encounter: Payer: Self-pay | Admitting: Vascular Surgery

## 2014-08-06 ENCOUNTER — Ambulatory Visit (INDEPENDENT_AMBULATORY_CARE_PROVIDER_SITE_OTHER): Payer: BC Managed Care – PPO | Admitting: Vascular Surgery

## 2014-08-06 ENCOUNTER — Other Ambulatory Visit: Payer: Self-pay | Admitting: Vascular Surgery

## 2014-08-06 ENCOUNTER — Ambulatory Visit (HOSPITAL_COMMUNITY)
Admission: RE | Admit: 2014-08-06 | Discharge: 2014-08-06 | Disposition: A | Discharge status: Home / Self Care | Payer: BC Managed Care – PPO | Source: Ambulatory Visit | Attending: Vascular Surgery | Admitting: Vascular Surgery

## 2014-08-06 ENCOUNTER — Ambulatory Visit (INDEPENDENT_AMBULATORY_CARE_PROVIDER_SITE_OTHER)
Admission: RE | Admit: 2014-08-06 | Discharge: 2014-08-06 | Disposition: A | Discharge status: Home / Self Care | Payer: BC Managed Care – PPO | Source: Ambulatory Visit | Attending: Vascular Surgery | Admitting: Vascular Surgery

## 2014-08-06 VITALS — BP 143/74 | HR 70 | Temp 98.0°F | Resp 16 | Ht 76.0 in | Wt 271.0 lb

## 2014-08-06 DIAGNOSIS — L97519 Non-pressure chronic ulcer of other part of right foot with unspecified severity: Secondary | ICD-10-CM | POA: Diagnosis not present

## 2014-08-06 DIAGNOSIS — M79671 Pain in right foot: Secondary | ICD-10-CM

## 2014-08-06 DIAGNOSIS — I739 Peripheral vascular disease, unspecified: Secondary | ICD-10-CM

## 2014-08-06 DIAGNOSIS — M79604 Pain in right leg: Secondary | ICD-10-CM

## 2014-08-06 DIAGNOSIS — I70209 Unspecified atherosclerosis of native arteries of extremities, unspecified extremity: Secondary | ICD-10-CM | POA: Insufficient documentation

## 2014-08-06 DIAGNOSIS — L98499 Non-pressure chronic ulcer of skin of other sites with unspecified severity: Secondary | ICD-10-CM

## 2014-08-06 NOTE — Progress Notes (Signed)
[facility] [place] [facility] HISTORY AND PHYSICAL   History of Present Illness:  Patient is a 58 y.o. year old male who presents for evaluation of pain in the right foot. The patient has a several year history of diabetes. He also has numbness and tingling in his right foot from several prior back operations. He also has numbness and tingling in his feet secondary to diabetes. He denies claudication symptoms. He is a former smoker but quit 30 years ago. He does have a history of coronary artery disease and had angioplasty in 2006. He currently does not have chest pain. He previously developed a blister on his right first toe approximately 2 years ago. This healed spontaneously. He recently developed a very painful callus on his right foot. This was shaved off by podiatry approximately 3 weeks ago. He currently is covering this with a dry dressing..  Other medical problems include hypertension, elevated cholesterol, obesity, arthritis all of which are currently stable.  Past Medical History  Diagnosis Date  . Diabetes mellitus   . HTN (hypertension)   . CAD (coronary artery disease)     status post PCI and bare metal stenting of   the proximal right coronary artery with placement of a Driver bare   metal stent.   . Myocardial infarction   . Hypercholesterolemia   . Obesity   . Hyperlipidemia   . DJD (degenerative joint disease)   . First degree AV block     with intermittent second degree types I and    II heart block.   . Oral candidiasis   . Depression   . Heart murmur 1974  . Heart attack 2008  . Gout   . Celiac disease     Past Surgical History  Procedure Laterality Date  . Right ankle fracture open reduction internal fixation.  2003  . Laminotomy  2001, 2006      Right L4-5 interlaminar laminotomy with excision of herniated   disc with operating microscope.  . Neck surgery    . Esophagogastroduodenoscopy  08/2008  . Colonoscopy  08/2008    Social History History   Substance Use Topics  . Smoking status: Former Research scientist (life sciences)  . Smokeless tobacco: Not on file     Comment: quit in 1979  . Alcohol Use: No    Family History Family History  Problem Relation Age of Onset  . Cancer Mother   . Cancer Father   . Cancer Sister     Allergies  Allergies  Allergen Reactions  . Vicodin [Hydrocodone-Acetaminophen] Nausea Only  . Statins     Legs ache     Current Outpatient Prescriptions  Medication Sig Dispense Refill  . halobetasol (ULTRAVATE) 0.05 % ointment Apply topically 2 (two) times daily.  1 Tube  5  . insulin glargine (LANTUS) 100 unit/mL SOPN May titrate up to 18 units qhs  3 mL  5  . linagliptin (TRADJENTA) 5 MG TABS tablet Take 1 tablet (5 mg total) by mouth daily.  30 tablet  5  . oxyCODONE-acetaminophen (PERCOCET) 10-325 MG per tablet Take 1 tablet by mouth every 4 (four) hours as needed for pain. As needed for pain  150 tablet  0  . rosuvastatin (CRESTOR) 5 MG tablet Take 1 tablet (5 mg total) by mouth at bedtime.  30 tablet  5   No current facility-administered medications for this visit.    ROS:   General:  No weight loss, Fever, chills  HEENT: No recent headaches, no  nasal bleeding, no visual changes, no sore throat  Neurologic: + dizziness, blackouts, seizures. No recent symptoms of stroke or mini- stroke. No recent episodes of slurred speech, or temporary blindness.  Cardiac: No recent episodes of chest pain/pressure, no shortness of breath at rest.  No shortness of breath with exertion.  Denies history of atrial fibrillation or irregular heartbeat  Vascular: No history of rest pain in feet.  No history of claudication.  + history of non-healing ulcer, No history of DVT   Pulmonary: No home oxygen, no productive cough, no hemoptysis,  No asthma or wheezing  Musculoskeletal:  [x ] Arthritis, [ ]  Low back pain,  [ ]  Joint pain  Hematologic:No history of hypercoagulable state.  No history of easy bleeding.  No history of  anemia  Gastrointestinal: No hematochezia or melena,  No gastroesophageal reflux, no trouble swallowing  Urinary: [ ]  chronic Kidney disease, [ ]  on HD - [ ]  MWF or [ ]  TTHS, [ ]  Burning with urination, [ ]  Frequent urination, [ ]  Difficulty urinating;   Skin: No rashes  Psychological: No history of anxiety,  No history of depression   Physical Examination  Filed Vitals:   08/06/14 1218  BP: 143/74  Pulse: 70  Temp: 98 F (36.7 C)  TempSrc: Oral  Resp: 16  Height: 6\' 4"  (1.93 m)  Weight: 271 lb (122.925 kg)  SpO2: 98%    Body mass index is 33 kg/(m^2).  General:  Alert and oriented, no acute distress HEENT: Normal Neck: No bruit or JVD Pulmonary: Clear to auscultation bilaterally Cardiac: Regular Rate and Rhythm without murmur Abdomen: Soft, non-tender, non-distended, no mass Skin: No rash, multiple calluses left and right first toe smooth callus right first metatarsal head no open ulceration, dry scaly skin bottoms of feet bilaterally Extremity Pulses:  2+ radial, brachial, femoral, dorsalis pedis pulses bilaterally, absent posterior tibial pulses bilaterally Musculoskeletal: No deformity or edema  Neurologic: Upper and lower extremity motor 5/5 and symmetric  DATA:  Patient had bilateral ABIs today which were 1.25 on the right 1.36 on the left. He also arterial duplex exam which showed no significant stenosis except possibly in the proximal anterior tibial artery there was mild stenosis. Waveforms were triphasic throughout the right leg.   ASSESSMENT:  Diabetic neuropathy with foot changes consistent with this. He may have mild tibial arterial occlusive disease but has palpable pulses in the feet with normal ABIs at this point in triphasic waveforms.   PLAN:  No vascular surgical intervention at this time. Primary problem is related to neuropathy rather than arteriopathy. I did counsel the patient that he needs to resume his aspirin. He'll followup with [place] on as-needed  basis.  Ruta Hinds, MD [facility] Office: [phone] Pager: [phone]

## 2014-08-06 NOTE — Patient Instructions (Signed)
Diabetes and Foot Care Diabetes may cause you to have problems because of poor blood supply (circulation) to your feet and legs. This may cause the skin on your feet to become thinner, break easier, and heal more slowly. Your skin may become dry, and the skin may peel and crack. You may also have nerve damage in your legs and feet causing decreased feeling in them. You may not notice minor injuries to your feet that could lead to infections or more serious problems. Taking care of your feet is one of the most important things you can do for yourself.  HOME CARE INSTRUCTIONS  Wear shoes at all times, even in the house. Do not go barefoot. Bare feet are easily injured.  Check your feet daily for blisters, cuts, and redness. If you cannot see the bottom of your feet, use a mirror or ask someone for help.  Wash your feet with warm water (do not use hot water) and mild soap. Then pat your feet and the areas between your toes until they are completely dry. Do not soak your feet as this can dry your skin.  Apply a moisturizing lotion or petroleum jelly (that does not contain alcohol and is unscented) to the skin on your feet and to dry, brittle toenails. Do not apply lotion between your toes.  Trim your toenails straight across. Do not dig under them or around the cuticle. File the edges of your nails with an emery board or nail file.  Do not cut corns or calluses or try to remove them with medicine.  Wear clean socks or stockings every day. Make sure they are not too tight. Do not wear knee-high stockings since they may decrease blood flow to your legs.  Wear shoes that fit properly and have enough cushioning. To break in new shoes, wear them for just a few hours a day. This prevents you from injuring your feet. Always look in your shoes before you put them on to be sure there are no objects inside.  Do not cross your legs. This may decrease the blood flow to your feet.  If you find a minor scrape,  cut, or break in the skin on your feet, keep it and the skin around it clean and dry. These areas may be cleansed with mild soap and water. Do not cleanse the area with peroxide, alcohol, or iodine.  When you remove an adhesive bandage, be sure not to damage the skin around it.  If you have a wound, look at it several times a day to make sure it is healing.  Do not use heating pads or hot water bottles. They may burn your skin. If you have lost feeling in your feet or legs, you may not know it is happening until it is too late.  Make sure your health care provider performs a complete foot exam at least annually or more often if you have foot problems. Report any cuts, sores, or bruises to your health care provider immediately. SEEK MEDICAL CARE IF:   You have an injury that is not healing.  You have cuts or breaks in the skin.  You have an ingrown nail.  You notice redness on your legs or feet.  You feel burning or tingling in your legs or feet.  You have pain or cramps in your legs and feet.  Your legs or feet are numb.  Your feet always feel cold. SEEK IMMEDIATE MEDICAL CARE IF:   There is increasing redness,   swelling, or pain in or around a wound.  There is a red line that goes up your leg.  Pus is coming from a wound.  You develop a fever or as directed by your health care provider.  You notice a bad smell coming from an ulcer or wound. Document Released: 09/30/2000 Document Revised: 06/05/2013 Document Reviewed: 03/12/2013 [employer] Patient Information 2015 [employer]. This information is not intended to replace advice given to you by your health care provider. Make sure you discuss any questions you have with your health care provider.  

## 2014-09-04 LAB — BASIC METABOLIC PANEL
BUN: 15 mg/dL (ref 6–23)
CO2: 27 mEq/L (ref 19–32)
CREATININE: 0.74 mg/dL (ref 0.50–1.35)
Calcium: 9.1 mg/dL (ref 8.4–10.5)
Chloride: 101 mEq/L (ref 96–112)
Glucose, Bld: 210 mg/dL — ABNORMAL HIGH (ref 70–99)
Potassium: 4.4 mEq/L (ref 3.5–5.3)
Sodium: 137 mEq/L (ref 135–145)

## 2014-09-04 LAB — HEPATIC FUNCTION PANEL
ALBUMIN: 4.1 g/dL (ref 3.5–5.2)
ALT: 12 U/L (ref 0–53)
AST: 13 U/L (ref 0–37)
Alkaline Phosphatase: 72 U/L (ref 39–117)
Bilirubin, Direct: 0.1 mg/dL (ref 0.0–0.3)
Indirect Bilirubin: 0.4 mg/dL (ref 0.2–1.2)
Total Bilirubin: 0.5 mg/dL (ref 0.2–1.2)
Total Protein: 6.5 g/dL (ref 6.0–8.3)

## 2014-09-04 LAB — LIPID PANEL
Cholesterol: 161 mg/dL (ref 0–200)
HDL: 32 mg/dL — ABNORMAL LOW (ref >39)
LDL CALC: 108 mg/dL — AB (ref 0–99)
Total CHOL/HDL Ratio: 5 Ratio
Triglycerides: 105 mg/dL (ref <150)
VLDL: 21 mg/dL (ref 0–40)

## 2014-09-05 ENCOUNTER — Ambulatory Visit: Payer: BC Managed Care – PPO | Admitting: Family Medicine

## 2014-09-05 ENCOUNTER — Telehealth: Payer: Self-pay | Admitting: *Deleted

## 2014-09-05 ENCOUNTER — Telehealth: Payer: Self-pay | Admitting: Family Medicine

## 2014-09-05 LAB — MICROALBUMIN, URINE: MICROALB UR: 1.4 mg/dL (ref <2.0)

## 2014-09-05 MED ORDER — INSULIN GLARGINE 100 UNITS/ML SOLOSTAR PEN: PEN_INJECTOR | SUBCUTANEOUS | Status: DC

## 2014-09-05 MED ORDER — LINAGLIPTIN 5 MG PO TABS: 5.0000 mg | ORAL_TABLET | Freq: Every day | ORAL | Status: DC

## 2014-09-05 MED ORDER — ROSUVASTATIN CALCIUM 5 MG PO TABS: 5.0000 mg | ORAL_TABLET | Freq: Every day | ORAL | Status: DC

## 2014-09-05 NOTE — Telephone Encounter (Signed)
Pt thought his appt was today he was sent a letter around 10/7 to reschedule He was rescheduled now for the 1st of dec and will need all his meds refilled  They are done by the 28th of Nov    [facility]

## 2014-09-05 NOTE — Telephone Encounter (Signed)
May refill all meds times 1 will need to keep follow up office visit

## 2014-09-05 NOTE — Telephone Encounter (Signed)
Patient has office visit Dec1 but will run out of Oxycodone on Nov 27 and needs enough till office visit.

## 2014-09-05 NOTE — Telephone Encounter (Signed)
[place] to see 

## 2014-09-07 NOTE — Telephone Encounter (Signed)
Patient typically gets 150 tablets, he may have a prescription for 50 tablets, one every 4 hours, keep follow-up visit in December for standard prescriptions

## 2014-09-08 ENCOUNTER — Other Ambulatory Visit: Payer: Self-pay | Admitting: *Deleted

## 2014-09-08 MED ORDER — OXYCODONE-ACETAMINOPHEN 10-325 MG PO TABS: 1.0000 | ORAL_TABLET | ORAL | Status: DC | PRN

## 2014-09-08 NOTE — Telephone Encounter (Signed)
Discussed with pt. rx up front for pickup.

## 2014-09-16 ENCOUNTER — Encounter: Payer: Self-pay | Admitting: Family Medicine

## 2014-09-16 ENCOUNTER — Ambulatory Visit (INDEPENDENT_AMBULATORY_CARE_PROVIDER_SITE_OTHER): Payer: BC Managed Care – PPO | Admitting: Family Medicine

## 2014-09-16 VITALS — BP 132/78 | Ht 75.0 in | Wt 275.4 lb

## 2014-09-16 DIAGNOSIS — E08621 Diabetes mellitus due to underlying condition with foot ulcer: Secondary | ICD-10-CM

## 2014-09-16 DIAGNOSIS — G894 Chronic pain syndrome: Secondary | ICD-10-CM

## 2014-09-16 DIAGNOSIS — E1142 Type 2 diabetes mellitus with diabetic polyneuropathy: Secondary | ICD-10-CM

## 2014-09-16 DIAGNOSIS — Z23 Encounter for immunization: Secondary | ICD-10-CM

## 2014-09-16 DIAGNOSIS — E1342 Other specified diabetes mellitus with diabetic polyneuropathy: Secondary | ICD-10-CM

## 2014-09-16 DIAGNOSIS — L97529 Non-pressure chronic ulcer of other part of left foot with unspecified severity: Secondary | ICD-10-CM

## 2014-09-16 DIAGNOSIS — G629 Polyneuropathy, unspecified: Secondary | ICD-10-CM

## 2014-09-16 DIAGNOSIS — E119 Type 2 diabetes mellitus without complications: Secondary | ICD-10-CM

## 2014-09-16 LAB — POCT GLYCOSYLATED HEMOGLOBIN (HGB A1C): Hemoglobin A1C: 10

## 2014-09-16 MED ORDER — OXYCODONE-ACETAMINOPHEN 10-325 MG PO TABS: 1.0000 | ORAL_TABLET | ORAL | Status: DC | PRN

## 2014-09-16 MED ORDER — LINAGLIPTIN 5 MG PO TABS: 5.0000 mg | ORAL_TABLET | Freq: Every day | ORAL | Status: DC

## 2014-09-16 NOTE — Progress Notes (Signed)
   Subjective:    Patient ID: [name], male    DOB: 05-23-1956, 58 y.o.   MRN: [id number]  HPI This patient was seen today for chronic pain  The medication list was reviewed and updated.   -Compliance with pain medication: yes  The patient was advised the importance of maintaining medication and not using illegal substances with these.  Refills needed: yes  The patient was educated that we can provide 3 monthly scripts for their medication, it is their responsibility to follow the instructions.  Side effects or complications from medications: none  Patient is aware that pain medications are meant to minimize the severity of the pain to allow their pain levels to improve to allow for better function. They are aware of that pain medications cannot totally remove their pain.  Due for UDT ( at least once per year) : Next visit     25 minutes spent with the patient discussing his chronic pain plus diabetes control plus diabetic foot ulcer.   Review of Systems Denies chest pain shortness breath high fever chills relate some foot discomfort    Objective:   Physical Exam  Lungs clear heart trigger pulse normal abdomen soft no guarding rebound extremities no edema foot ulcer is noted on his foot being cared for by podiatry      Assessment & Plan:  Foot ulcer followed by podiatry Chronic pain prescriptions given follow-up in 3 months Diabetes he needed refills of his medication but he was instructed to go ahead and called the specialists as well to get a follow-up appointment is A1c shows poor control he stated that if he has difficulty getting the appointment with specialists he will let us know. Refills were given. Flu vaccine today. Follow-up 3 months.

## 2014-09-16 NOTE — Patient Instructions (Signed)

## 2014-09-19 ENCOUNTER — Telehealth: Payer: Self-pay | Admitting: Family Medicine

## 2014-09-19 MED ORDER — EMPAGLIFLOZIN 10 MG PO TABS: 10.0000 mg | ORAL_TABLET | Freq: Every day | ORAL | Status: DC

## 2014-09-19 NOTE — Telephone Encounter (Signed)
Refills on Jardiance sent to pharmacy per Dr. Nicki Reaper. Patient was notified.

## 2014-09-19 NOTE — Telephone Encounter (Signed)
NTC- i thought he needed refills of tradjenta, ( i thought we sent that) please clarify with pt what he needs and we can do it

## 2014-09-19 NOTE — Telephone Encounter (Signed)
Pt states that at his last OV on 12/1 that you said you would send him in a script For [name] to help him with his Diabetes issues. I did not see this in the [place] note  Please advise  [facility]

## 2014-11-18 ENCOUNTER — Other Ambulatory Visit: Payer: Self-pay | Admitting: *Deleted

## 2014-11-18 ENCOUNTER — Ambulatory Visit: Payer: Self-pay | Admitting: Family Medicine

## 2014-11-18 MED ORDER — EMPAGLIFLOZIN 10 MG PO TABS: 10.0000 mg | ORAL_TABLET | Freq: Every day | ORAL | Status: DC

## 2014-12-02 ENCOUNTER — Ambulatory Visit: Payer: Self-pay | Admitting: Family Medicine

## 2014-12-03 ENCOUNTER — Other Ambulatory Visit: Payer: Self-pay | Admitting: Family Medicine

## 2014-12-03 NOTE — Telephone Encounter (Signed)
Patient needs refill on perocet 10/325 has appointment on 3/2 for med check it was reschedule for 2/15 the snow day.

## 2014-12-03 NOTE — Telephone Encounter (Addendum)
Per pharmacy-Percocet last filled 11/18/14 for #150(30 day supply)- Next rx due 12/18/14- Patient to pick up refills at office visit 12/17/14.

## 2014-12-17 ENCOUNTER — Encounter: Payer: Self-pay | Admitting: Family Medicine

## 2014-12-17 ENCOUNTER — Ambulatory Visit (INDEPENDENT_AMBULATORY_CARE_PROVIDER_SITE_OTHER): Payer: BLUE CROSS/BLUE SHIELD | Admitting: Family Medicine

## 2014-12-17 VITALS — BP 104/70 | Ht 75.0 in | Wt 275.5 lb

## 2014-12-17 DIAGNOSIS — G894 Chronic pain syndrome: Secondary | ICD-10-CM

## 2014-12-17 DIAGNOSIS — L98499 Non-pressure chronic ulcer of skin of other sites with unspecified severity: Secondary | ICD-10-CM

## 2014-12-17 DIAGNOSIS — I739 Peripheral vascular disease, unspecified: Secondary | ICD-10-CM

## 2014-12-17 DIAGNOSIS — E785 Hyperlipidemia, unspecified: Secondary | ICD-10-CM

## 2014-12-17 DIAGNOSIS — L97509 Non-pressure chronic ulcer of other part of unspecified foot with unspecified severity: Secondary | ICD-10-CM | POA: Diagnosis not present

## 2014-12-17 DIAGNOSIS — E11621 Type 2 diabetes mellitus with foot ulcer: Secondary | ICD-10-CM | POA: Diagnosis not present

## 2014-12-17 DIAGNOSIS — I70209 Unspecified atherosclerosis of native arteries of extremities, unspecified extremity: Secondary | ICD-10-CM

## 2014-12-17 MED ORDER — OXYCODONE-ACETAMINOPHEN 10-325 MG PO TABS: 1.0000 | ORAL_TABLET | ORAL | Status: DC | PRN

## 2014-12-17 NOTE — Addendum Note (Signed)
Addended byCharolotte Capuchin D on: 12/17/2014 02:00 PM   Modules accepted: Orders

## 2014-12-17 NOTE — Patient Instructions (Addendum)
As part of your visit today we have covered your chronic pain. You have been given prescription(s) for pain medicines.The DEA and [place] require that any patient on pain medications must be seen every 3 months. You are expected to come in for a office visit before further pain medications are issued.   We will not refill medications or early nor will we give an extended month supply at the end of these prescriptions.It is your responsibility to keep up with medications. They will not be replaced.  It is your responsibility to schedule an office visit in 3-4 months to be seen before you are out of your medication. Do not call our office to request early refills or additional refills. Do not wait till the last moment to schedule the follow up visit. We highly recommend you schedule this now for 3 months.  We believe that most patients take their meds as prescribed but drug misuse and diversion is a serious problem in the [place]. Our office does standard measures to insure proper care to all. All patients are subject to random urine drug screens and random pill counts. Also all patients drug prescription records are reviewed on a regular basis in accordance with [facility] medical board policies.  Remember, do not use alcohol or illegal drugs with your pain medications.    We are required by law to adhere to strict regulations. Failure on our part to follow these regulations could jeopardize our prescription license which in turn would cause [place] not to be able to care for you.Thank you for your understanding and following these policies.  Diabetes Mellitus and Food It is important for you to manage your blood sugar (glucose) level. Your blood glucose level can be greatly affected by what you eat. Eating healthier foods in the appropriate amounts throughout the day at about the same time each day will help you control your blood glucose level. It can also help slow or prevent worsening of your diabetes  mellitus. Healthy eating may even help you improve the level of your blood pressure and reach or maintain a healthy weight.  HOW CAN FOOD AFFECT ME? Carbohydrates Carbohydrates affect your blood glucose level more than any other type of food. Your dietitian will help you determine how many carbohydrates to eat at each meal and teach you how to count carbohydrates. Counting carbohydrates is important to keep your blood glucose at a healthy level, especially if you are using insulin or taking certain medicines for diabetes mellitus. Alcohol Alcohol can cause sudden decreases in blood glucose (hypoglycemia), especially if you use insulin or take certain medicines for diabetes mellitus. Hypoglycemia can be a life-threatening condition. Symptoms of hypoglycemia (sleepiness, dizziness, and disorientation) are similar to symptoms of having too much alcohol.  If your health care provider has given you approval to drink alcohol, do so in moderation and use the following guidelines:  Women should not have more than one drink per day, and men should not have more than two drinks per day. One drink is equal to:  12 oz of beer.  5 oz of wine.  1 oz of hard liquor.  Do not drink on an empty stomach.  Keep yourself hydrated. Have water, diet soda, or unsweetened iced tea.  Regular soda, juice, and other mixers might contain a lot of carbohydrates and should be counted. WHAT FOODS ARE NOT RECOMMENDED? As you make food choices, it is important to remember that all foods are not the same. Some foods have  fewer nutrients per serving than other foods, even though they might have the same number of calories or carbohydrates. It is difficult to get your body what it needs when you eat foods with fewer nutrients. Examples of foods that you should avoid that are high in calories and carbohydrates but low in nutrients include:  Trans fats (most processed foods list trans fats on the Nutrition Facts  label).  Regular soda.  Juice.  Candy.  Sweets, such as cake, pie, doughnuts, and cookies.  Fried foods. WHAT FOODS CAN I EAT? Have nutrient-rich foods, which will nourish your body and keep you healthy. The food you should eat also will depend on several factors, including:  The calories you need.  The medicines you take.  Your weight.  Your blood glucose level.  Your blood pressure level.  Your cholesterol level. You also should eat a variety of foods, including:  Protein, such as meat, poultry, fish, tofu, nuts, and seeds (lean animal proteins are best).  Fruits.  Vegetables.  Dairy products, such as milk, cheese, and yogurt (low fat is best).  Breads, grains, pasta, cereal, rice, and beans.  Fats such as olive oil, trans fat-free margarine, canola oil, avocado, and olives. DOES EVERYONE WITH DIABETES MELLITUS HAVE THE SAME MEAL PLAN? Because every person with diabetes mellitus is different, there is not one meal plan that works for everyone. It is very important that you meet with a dietitian who will help you create a meal plan that is just right for you. Document Released: 06/30/2005 Document Revised: 10/08/2013 Document Reviewed: 08/30/2013 [facility] Patient Information 2015 [place], [place]. This information is not intended to replace advice given to you by your health care provider. Make sure you discuss any questions you have with your health care provider.

## 2014-12-17 NOTE — Progress Notes (Signed)
   Subjective:    Patient ID: [name], male    DOB: 10/15/1956, 59 y.o.   MRN: [id number]  HPI This patient was seen today for chronic pain  The medication list was reviewed and updated.   -Compliance with pain medication: yes  The patient was advised the importance of maintaining medication and not using illegal substances with these.  Refills needed: yes  The patient was educated that we can provide 3 monthly scripts for their medication, it is their responsibility to follow the instructions.  Side effects or complications from medications: none  Patient is aware that pain medications are meant to minimize the severity of the pain to allow their pain levels to improve to allow for better function. They are aware of that pain medications cannot totally remove their pain.  Due for UDT ( at least once per year) : later this year  Patient states that he has no new concerns at this time.   Long discussion held regarding his foot ulcer they're gummy do an MRI in the future the importance of getting diabetes under control discussed  Importance keeping cholesterol under control to lessen the risk of vascular disease was discussed with the patient The importance of getting diabetes under better control we did discuss his glucose readings they seem to be high will I have advised to adjust his insulin gradually over the course of the next several weeks guidelines were given to the patient.     Review of Systems  Constitutional: Negative for activity change, appetite change and fatigue.  HENT: Negative for congestion.   Respiratory: Negative for cough.   Cardiovascular: Negative for chest pain.  Gastrointestinal: Negative for abdominal pain.  Endocrine: Negative for polydipsia and polyphagia.  Neurological: Negative for weakness.  Psychiatric/Behavioral: Negative for confusion.       Objective:   Physical Exam  Constitutional: He appears well-nourished. No distress.    Cardiovascular: Normal rate, regular rhythm and normal heart sounds.   No murmur heard. Pulmonary/Chest: Effort normal and breath sounds normal. No respiratory distress.  Musculoskeletal: He exhibits no edema.  Lymphadenopathy:    He has no cervical adenopathy.  Neurological: He is alert.  Psychiatric: His behavior is normal.  Vitals reviewed.         Assessment & Plan:  1. Atherosclerotic PVD with ulceration The patient seen podiatry currently they're treating him for a severe foot ulcer they got him on antibiotics MRIs upcoming. Patient wanted to make sure his liver and kidney functions are good. I told mom recent lab work within the past several months liver and kidney function look good.  2. Type 2 diabetes mellitus with foot ulcer Diabetes poor control increase Lantus I discussed with the patient how to effectively get this under better control. Referral to endocrinology. He did see local endocrinologist but for one reason or another they will not see him back  3. Hyperlipidemia He is taking his cholesterol medicine watching his diet as best he can lab work on next visit  4. Chronic pain syndrome Pain medications given 3 separate prescriptions. Patient does not abuse it. It helps him with his back knee pain as well as diabetic foot pain

## 2014-12-18 ENCOUNTER — Other Ambulatory Visit (HOSPITAL_COMMUNITY): Payer: Self-pay | Admitting: Podiatry

## 2014-12-18 DIAGNOSIS — M869 Osteomyelitis, unspecified: Secondary | ICD-10-CM

## 2014-12-19 ENCOUNTER — Ambulatory Visit (HOSPITAL_COMMUNITY)
Admission: RE | Admit: 2014-12-19 | Discharge: 2014-12-19 | Disposition: A | Discharge status: Home / Self Care | Payer: BLUE CROSS/BLUE SHIELD | Source: Ambulatory Visit | Attending: Podiatry | Admitting: Podiatry

## 2014-12-19 ENCOUNTER — Other Ambulatory Visit (HOSPITAL_COMMUNITY): Payer: Self-pay | Admitting: Podiatry

## 2014-12-19 DIAGNOSIS — M79674 Pain in right toe(s): Secondary | ICD-10-CM | POA: Insufficient documentation

## 2014-12-19 DIAGNOSIS — M869 Osteomyelitis, unspecified: Secondary | ICD-10-CM

## 2014-12-23 ENCOUNTER — Encounter (HOSPITAL_COMMUNITY)
Admission: RE | Admit: 2014-12-23 | Discharge: 2014-12-23 | Disposition: A | Discharge status: Home / Self Care | Payer: BLUE CROSS/BLUE SHIELD | Source: Ambulatory Visit | Attending: Podiatry | Admitting: Podiatry

## 2014-12-23 ENCOUNTER — Encounter (HOSPITAL_COMMUNITY): Payer: Self-pay

## 2014-12-23 ENCOUNTER — Other Ambulatory Visit: Payer: Self-pay | Admitting: Podiatry

## 2014-12-23 DIAGNOSIS — E1142 Type 2 diabetes mellitus with diabetic polyneuropathy: Secondary | ICD-10-CM | POA: Diagnosis present

## 2014-12-23 DIAGNOSIS — M869 Osteomyelitis, unspecified: Secondary | ICD-10-CM | POA: Diagnosis not present

## 2014-12-23 DIAGNOSIS — L97519 Non-pressure chronic ulcer of other part of right foot with unspecified severity: Secondary | ICD-10-CM | POA: Diagnosis not present

## 2014-12-23 LAB — CBC WITH DIFFERENTIAL/PLATELET
BASOS PCT: 1 % (ref 0–1)
Basophils Absolute: 0.1 10*3/uL (ref 0.0–0.1)
EOS ABS: 0.2 10*3/uL (ref 0.0–0.7)
Eosinophils Relative: 2 % (ref 0–5)
HEMATOCRIT: 41.4 % (ref 39.0–52.0)
Hemoglobin: 14.1 g/dL (ref 13.0–17.0)
Lymphocytes Relative: 20 % (ref 12–46)
Lymphs Abs: 2.1 10*3/uL (ref 0.7–4.0)
MCH: 29.2 pg (ref 26.0–34.0)
MCHC: 34.1 g/dL (ref 30.0–36.0)
MCV: 85.7 fL (ref 78.0–100.0)
MONOS PCT: 7 % (ref 3–12)
Monocytes Absolute: 0.7 10*3/uL (ref 0.1–1.0)
NEUTROS ABS: 7.1 10*3/uL (ref 1.7–7.7)
Neutrophils Relative %: 70 % (ref 43–77)
Platelets: 287 10*3/uL (ref 150–400)
RBC: 4.83 MIL/uL (ref 4.22–5.81)
RDW: 12.9 % (ref 11.5–15.5)
WBC: 10.1 10*3/uL (ref 4.0–10.5)

## 2014-12-23 LAB — BASIC METABOLIC PANEL
ANION GAP: 7 (ref 5–15)
BUN: 15 mg/dL (ref 6–23)
CO2: 25 mmol/L (ref 19–32)
CREATININE: 0.92 mg/dL (ref 0.50–1.35)
Calcium: 9 mg/dL (ref 8.4–10.5)
Chloride: 106 mmol/L (ref 96–112)
Glucose, Bld: 158 mg/dL — ABNORMAL HIGH (ref 70–99)
POTASSIUM: 4 mmol/L (ref 3.5–5.1)
Sodium: 138 mmol/L (ref 135–145)

## 2014-12-23 LAB — C-REACTIVE PROTEIN: CRP: 0.8 mg/dL — ABNORMAL HIGH (ref <0.60)

## 2014-12-23 LAB — SEDIMENTATION RATE: SED RATE: 9 mm/h (ref 0–16)

## 2014-12-23 NOTE — Pre-Procedure Instructions (Signed)
Patient given information to sign up for my chart at home. 

## 2014-12-23 NOTE — Patient Instructions (Signed)
[name]  12/23/2014   Your procedure is scheduled on:  12/24/2014  Report to [facility] at  16  AM.  Call this number if you have problems the morning of surgery: [phone]   Remember:   Do not eat food or drink liquids after midnight.   Take these medicines the morning of surgery with A SIP OF WATER:  Oxycodone. Take 1/2 Lantus tonight. No medicine for your diabetes in the morning.   Do not wear jewelry, make-up or nail polish.  Do not wear lotions, powders, or perfumes.   Do not shave 48 hours prior to surgery. Men may shave face and neck.  Do not bring valuables to the hospital.  [facility] is not responsible for any belongings or valuables.               Contacts, dentures or bridgework may not be worn into surgery.  Leave suitcase in the car. After surgery it may be brought to your room.  For patients admitted to the hospital, discharge time is determined by your treatment team.               Patients discharged the day of surgery will not be allowed to drive home.  Name and phone number of your driver: family  Special Instructions: Shower using CHG 2 nights before surgery and the night before surgery.  If you shower the day of surgery use CHG.  Use special wash - you have one bottle of CHG for all showers.  You should use approximately 1/3 of the bottle for each shower.   Please read over the following fact sheets that you were given: Pain Booklet, Coughing and Deep Breathing, Surgical Site Infection Prevention, Anesthesia Post-op Instructions and Care and Recovery After Surgery Wound Debridement Wound debridement is a procedure used to remove dead tissue and contaminated substances from a wound. A wound must be clean to heal. It also must get a good supply of blood. Anything that is stopping this must be taken out of the wound. This could be dead tissue, scar tissue, fluid buildup, or debris from outside of the body. Wounds that are not cleaned by debridement heal  slowly or not at all. They can become infected. Any infection in the tissue can also spread to nearby areas or to other parts of the body through the blood. Wound debridement can be done through a surgical procedure or various other methods. Debridement is sometimes done to get a sample of tissue from the wound. The tissue can be checked under a microscope or sent to a lab for testing.  LET YOUR CAREGIVER KNOW ABOUT:   Any allergies you have.  All medicines you are taking, including vitamins, steroids, herbs, eyedrops, and over-the-counter medicines and creams.   Previous problems you or members of your family have had with the use of anesthetics.   Any blood disorders you have had.   Previous surgeries you have had.   Other health problems you have.  RISKS AND COMPLICATIONS  Generally, wound debridement is a safe procedure. However, as with any medical procedure, complications can occur. Possible complications include:  Bleeding that does not stop.   Infection.   Damage to nerves, blood vessels, or healthy tissue inside the wound.   Pain.   Lack of healing. BEFORE THE PROCEDURE   The caregiver will check the wound for signs of healing or infection. A measurement of the wound will be taken, including how deep  it is. A metal tool (probe) may be used. Blood tests may be done to check for infection.   If you will be given medicine to make you sleep through the procedure (general anesthetic), do not eat or drink anything for at least 6 hours before the procedure. Ask your caregiver if it is okay to have a sip of water with any needed medicine.   Make plans to have someone drive you home after the procedure. Also, make sure someone can stay with you for a few days.  PROCEDURE  The following methods may be used alone or in combination. Surgical debridement:  Small monitors may be placed on your body. They are used to check your heart, blood pressure, and oxygen level.    You may be given medicine through an intravenous (IV) access tube in your hand or arm.   You might be given medicine to help you relax (sedative).   You may be given medicine to numb the area around the wound (local anesthetic). If the wound is deep or wide, you may be given general anesthetic to make you sleep through the procedure.   Once you are asleep or the wound area is numb, the wound may be washed with a sterile saltwater solution.   Scissors, surgical knives (scalpels), and surgical tweezers (forceps) will be used to remove dead or dying tissue. Any other material that should not be in the wound will also be taken out.   After the tissue and other material have been removed from the wound, the wound will be washed again.   A bandage (dressing) may be placed over the wound.  Mechanical debridement: Mechanical debridement may involve various techniques:  A dressing may be used to pull off dead tissue. A moist dressing is placed over the wound. It is left in place until it is dry. When the dressing is lifted off, this lifts away the dead tissue.   Whirlpool baths may be used to flush the wound with forceful streams of hot water.   The wound may be flushed with sterile solution.  Surgical instruments that use water under high pressure may be used to clean the wound. Chemical debridement:  A chemical medicine is put on the wound. The aim is to dissolve dead or dying tissue. Ointments may also be used. Autolytic debridement:  A special dressing is used to trap moisture inside the wound. The goal is for the wound to heal naturally under the dressing. Healing takes longer with this treatment. AFTER THE PROCEDURE   If a local anesthetic is used, you will be allowed to go home as soon as you are ready. If a general anesthetic is used, you will be taken to a recovery area until you are stable. Your blood pressure and pulse will be checked often. You may continue to get  fluids through the IV tube for a while. Once you are stable, you may be able to go home, or you may need to stay in the hospital overnight.Your caregiver will decide when you can go home.   You may feel some pain. You will likely be given medicine for pain.  Before you go home, make sure you know how to care for the wound. This includes knowing when the dressing should be changed and how to change it.   Set up a follow-up appointment before leaving. Document Released: 12/28/2009 Document Revised: 09/19/2012 Document Reviewed: 06/20/2012 [facility] Patient Information 2015 [place], [place]. This information is not intended to replace advice  given to you by your health care provider. Make sure you discuss any questions you have with your health care provider. PATIENT INSTRUCTIONS POST-ANESTHESIA  IMMEDIATELY FOLLOWING SURGERY:  Do not drive or operate machinery for the first twenty four hours after surgery.  Do not make any important decisions for twenty four hours after surgery or while taking narcotic pain medications or sedatives.  If you develop intractable nausea and vomiting or a severe headache please notify your doctor immediately.  FOLLOW-UP:  Please make an appointment with your surgeon as instructed. You do not need to follow up with anesthesia unless specifically instructed to do so.  WOUND CARE INSTRUCTIONS (if applicable):  Keep a dry clean dressing on the anesthesia/puncture wound site if there is drainage.  Once the wound has quit draining you may leave it open to air.  Generally you should leave the bandage intact for twenty four hours unless there is drainage.  If the epidural site drains for more than 36-48 hours please call the anesthesia department.  QUESTIONS?:  Please feel free to call your physician or the hospital operator if you have any questions, and they will be happy to assist you.

## 2014-12-24 ENCOUNTER — Encounter (HOSPITAL_COMMUNITY)
Admission: RE | Disposition: A | Discharge status: Home / Self Care | Payer: Self-pay | Source: Ambulatory Visit | Attending: Podiatry

## 2014-12-24 ENCOUNTER — Ambulatory Visit (HOSPITAL_COMMUNITY): Payer: BLUE CROSS/BLUE SHIELD | Admitting: Anesthesiology

## 2014-12-24 ENCOUNTER — Ambulatory Visit (HOSPITAL_COMMUNITY): Payer: BLUE CROSS/BLUE SHIELD

## 2014-12-24 ENCOUNTER — Encounter (HOSPITAL_COMMUNITY): Payer: Self-pay | Admitting: Certified Registered Nurse Anesthetist

## 2014-12-24 ENCOUNTER — Ambulatory Visit (HOSPITAL_COMMUNITY)
Admission: RE | Admit: 2014-12-24 | Discharge: 2014-12-24 | Disposition: A | Discharge status: Home / Self Care | Payer: BLUE CROSS/BLUE SHIELD | Source: Ambulatory Visit | Attending: Podiatry | Admitting: Podiatry

## 2014-12-24 DIAGNOSIS — E1169 Type 2 diabetes mellitus with other specified complication: Secondary | ICD-10-CM

## 2014-12-24 DIAGNOSIS — L97519 Non-pressure chronic ulcer of other part of right foot with unspecified severity: Secondary | ICD-10-CM | POA: Insufficient documentation

## 2014-12-24 DIAGNOSIS — E1142 Type 2 diabetes mellitus with diabetic polyneuropathy: Secondary | ICD-10-CM | POA: Insufficient documentation

## 2014-12-24 DIAGNOSIS — M869 Osteomyelitis, unspecified: Secondary | ICD-10-CM | POA: Insufficient documentation

## 2014-12-24 HISTORY — PX: WOUND DEBRIDEMENT: SHX247

## 2014-12-24 LAB — GLUCOSE, CAPILLARY
GLUCOSE-CAPILLARY: 116 mg/dL — AB (ref 70–99)
Glucose-Capillary: 94 mg/dL (ref 70–99)

## 2014-12-24 SURGERY — DEBRIDEMENT, WOUND
Anesthesia: Monitor Anesthesia Care | Laterality: Right

## 2014-12-24 MED ORDER — PROPOFOL 10 MG/ML IV BOLUS
INTRAVENOUS | Status: AC
  Filled 2014-12-24: qty 20

## 2014-12-24 MED ORDER — PROPOFOL INFUSION 10 MG/ML OPTIME
INTRAVENOUS | Status: DC | PRN
  Administered 2014-12-24: 100 ug/kg/min via INTRAVENOUS

## 2014-12-24 MED ORDER — ONDANSETRON HCL 4 MG/2ML IJ SOLN
INTRAMUSCULAR | Status: DC | PRN
  Administered 2014-12-24: 4 mg via INTRAVENOUS

## 2014-12-24 MED ORDER — SODIUM CHLORIDE 0.9 % IJ SOLN: 10.0000 mL | INTRAMUSCULAR | Status: DC | PRN

## 2014-12-24 MED ORDER — PROPOFOL 10 MG/ML IV BOLUS
INTRAVENOUS | Status: DC | PRN
  Administered 2014-12-24: 50 mg via INTRAVENOUS

## 2014-12-24 MED ORDER — SODIUM CHLORIDE 0.9 % IR SOLN
Freq: Once | Status: DC
  Filled 2014-12-24: qty 1

## 2014-12-24 MED ORDER — SODIUM CHLORIDE 0.9 % IR SOLN
Status: DC | PRN
  Administered 2014-12-24: 500 mL

## 2014-12-24 MED ORDER — 0.9 % SODIUM CHLORIDE (POUR BTL) OPTIME
TOPICAL | Status: DC | PRN
  Administered 2014-12-24: 1000 mL

## 2014-12-24 MED ORDER — MIDAZOLAM HCL 2 MG/2ML IJ SOLN
1.0000 mg | INTRAMUSCULAR | Status: AC | PRN
  Administered 2014-12-24 (×3): 2 mg via INTRAVENOUS
  Filled 2014-12-24 (×2): qty 2

## 2014-12-24 MED ORDER — BUPIVACAINE HCL (PF) 0.5 % IJ SOLN
INTRAMUSCULAR | Status: DC | PRN
  Administered 2014-12-24: 20 mL

## 2014-12-24 MED ORDER — ONDANSETRON HCL 4 MG/2ML IJ SOLN
4.0000 mg | Freq: Once | INTRAMUSCULAR | Status: AC
  Administered 2014-12-24: 4 mg via INTRAVENOUS

## 2014-12-24 MED ORDER — FENTANYL CITRATE 0.05 MG/ML IJ SOLN
INTRAMUSCULAR | Status: AC
  Filled 2014-12-24: qty 2

## 2014-12-24 MED ORDER — MIDAZOLAM HCL 2 MG/2ML IJ SOLN
INTRAMUSCULAR | Status: AC
  Filled 2014-12-24: qty 2

## 2014-12-24 MED ORDER — FENTANYL CITRATE 0.05 MG/ML IJ SOLN: 25.0000 ug | INTRAMUSCULAR | Status: DC | PRN

## 2014-12-24 MED ORDER — ONDANSETRON HCL 4 MG/2ML IJ SOLN
INTRAMUSCULAR | Status: AC
  Filled 2014-12-24: qty 2

## 2014-12-24 MED ORDER — SODIUM CHLORIDE 0.9 % IJ SOLN: 10.0000 mL | Freq: Two times a day (BID) | INTRAMUSCULAR | Status: DC

## 2014-12-24 MED ORDER — LACTATED RINGERS IV SOLN
INTRAVENOUS | Status: DC
Start: 2014-12-24 — End: 2014-12-24
  Administered 2014-12-24: 1000 mL via INTRAVENOUS

## 2014-12-24 MED ORDER — LACTATED RINGERS IV SOLN
INTRAVENOUS | Status: DC | PRN
  Administered 2014-12-24: 12:00:00 via INTRAVENOUS

## 2014-12-24 MED ORDER — BUPIVACAINE HCL (PF) 0.5 % IJ SOLN
INTRAMUSCULAR | Status: AC
  Filled 2014-12-24: qty 30

## 2014-12-24 MED ORDER — FENTANYL CITRATE 0.05 MG/ML IJ SOLN
25.0000 ug | INTRAMUSCULAR | Status: AC
  Administered 2014-12-24 (×2): 25 ug via INTRAVENOUS

## 2014-12-24 MED ORDER — LIDOCAINE HCL (CARDIAC) 10 MG/ML IV SOLN
INTRAVENOUS | Status: DC | PRN
  Administered 2014-12-24: 60 mg via INTRAVENOUS

## 2014-12-24 MED ORDER — ONDANSETRON HCL 4 MG/2ML IJ SOLN: 4.0000 mg | Freq: Once | INTRAMUSCULAR | Status: DC | PRN

## 2014-12-24 MED ORDER — FENTANYL CITRATE 0.05 MG/ML IJ SOLN
INTRAMUSCULAR | Status: DC | PRN
  Administered 2014-12-24: 25 ug via INTRAVENOUS
  Administered 2014-12-24: 50 ug via INTRAVENOUS
  Administered 2014-12-24: 25 ug via INTRAVENOUS

## 2014-12-24 MED ORDER — VANCOMYCIN HCL 10 G IV SOLR
2000.0000 mg | Freq: Once | INTRAVENOUS | Status: AC
  Administered 2014-12-24: 2000 mg via INTRAVENOUS
  Filled 2014-12-24: qty 2000

## 2014-12-24 MED ORDER — VANCOMYCIN HCL 10 G IV SOLR: 2000.0000 mg | Freq: Once | INTRAVENOUS | Status: DC

## 2014-12-24 MED ORDER — LIDOCAINE HCL (PF) 1 % IJ SOLN
INTRAMUSCULAR | Status: AC
  Filled 2014-12-24: qty 30

## 2014-12-24 SURGICAL SUPPLY — 30 items
BAG HAMPER (MISCELLANEOUS) ×1 IMPLANT
BANDAGE CONFORM 2  STR LF (GAUZE/BANDAGES/DRESSINGS) ×1 IMPLANT
BANDAGE ELASTIC 4 VELCRO NS (GAUZE/BANDAGES/DRESSINGS) ×1 IMPLANT
BANDAGE GAUZE ELAST BULKY 4 IN (GAUZE/BANDAGES/DRESSINGS) ×1 IMPLANT
COVER LIGHT HANDLE STERIS (MISCELLANEOUS) ×2 IMPLANT
DECANTER SPIKE VIAL GLASS SM (MISCELLANEOUS) ×1 IMPLANT
DRESSING TELFA 8X3 (GAUZE/BANDAGES/DRESSINGS) ×1 IMPLANT
DRSG ADAPTIC 3X8 NADH LF (GAUZE/BANDAGES/DRESSINGS) ×1 IMPLANT
DRSG TELFA 3X8 NADH (GAUZE/BANDAGES/DRESSINGS) ×2 IMPLANT
ELECT REM PT RETURN 9FT ADLT (ELECTROSURGICAL) ×2
ELECTRODE REM PT RTRN 9FT ADLT (ELECTROSURGICAL) IMPLANT
GLOVE BIO SURGEON STRL SZ 6.5 (GLOVE) ×1 IMPLANT
GLOVE BIO SURGEON STRL SZ7.5 (GLOVE) ×1 IMPLANT
GLOVE BIOGEL PI IND STRL 7.0 (GLOVE) IMPLANT
GLOVE BIOGEL PI INDICATOR 7.0 (GLOVE) ×2
GLOVE EXAM NITRILE PF LG BLUE (GLOVE) ×1 IMPLANT
GOWN STRL REUS W/ TWL LRG LVL4 (GOWN DISPOSABLE) IMPLANT
GOWN STRL REUS W/TWL LRG LVL4 (GOWN DISPOSABLE) ×6
KIT ROOM TURNOVER APOR (KITS) ×1 IMPLANT
NDL HYPO 27GX1-1/4 (NEEDLE) IMPLANT
NEEDLE HYPO 27GX1-1/4 (NEEDLE) ×4 IMPLANT
PACK BASIC LIMB (CUSTOM PROCEDURE TRAY) ×1 IMPLANT
PAD DRESSING TELFA 3X8 NADH (GAUZE/BANDAGES/DRESSINGS) IMPLANT
SET BASIN LINEN APH (SET/KITS/TRAYS/PACK) ×1 IMPLANT
SPONGE GAUZE 4X4 12PLY (GAUZE/BANDAGES/DRESSINGS) ×1 IMPLANT
SPONGE LAP 4X18 X RAY DECT (DISPOSABLE) ×1 IMPLANT
SUT PROLENE 4 0 PS 2 18 (SUTURE) ×1 IMPLANT
SWAB CULTURE LIQ STUART DBL (MISCELLANEOUS) ×1 IMPLANT
SYR CONTROL 10ML LL (SYRINGE) ×2 IMPLANT
TUBE ANAEROBIC PORT A CUL  W/M (MISCELLANEOUS) ×1 IMPLANT

## 2014-12-24 NOTE — Anesthesia Postprocedure Evaluation (Signed)
  Anesthesia Post-op Note  Patient: [name]  Procedure(s) Performed: Procedure(s): DEBRIDEMENT SOFT TISSUE AND BONE RIGHT FOOT (Right)  Patient Location: PACU  Anesthesia Type:MAC  Level of Consciousness: awake, alert  and oriented  Airway and Oxygen Therapy: Patient Spontanous Breathing and Patient connected to nasal cannula oxygen  Post-op Pain: none  Post-op Assessment: Post-op Vital signs reviewed, Patient's Cardiovascular Status Stable, Respiratory Function Stable, Patent Airway and Pain level controlled  Post-op Vital Signs: Reviewed and stable  Last Vitals:  Filed Vitals:   12/24/14 1135  BP: 120/62  Pulse:   Temp:   Resp: 0    Complications: No apparent anesthesia complications

## 2014-12-24 NOTE — Anesthesia Preprocedure Evaluation (Signed)
Anesthesia Evaluation  Patient identified by MRN, date of birth, ID band Patient awake    Reviewed: Allergy & Precautions, NPO status , Patient's Chart, lab work & pertinent test results  Airway Mallampati: I  TM Distance: >3 FB     Dental  (+) Teeth Intact   Pulmonary former smoker,  breath sounds clear to auscultation        Cardiovascular hypertension, Pt. on medications + CAD, + Past MI, + Cardiac Stents and + Peripheral Vascular Disease + dysrhythmias Rhythm:Regular Rate:Normal     Neuro/Psych PSYCHIATRIC DISORDERS Depression  Neuromuscular disease    GI/Hepatic   Endo/Other  diabetes, Type 2, Insulin DependentMorbid obesity  Renal/GU      Musculoskeletal  (+) Arthritis -,   Abdominal   Peds  Hematology   Anesthesia Other Findings   Reproductive/Obstetrics                             Anesthesia Physical Anesthesia Plan  ASA: III  Anesthesia Plan: MAC   Post-op Pain Management:    Induction: Intravenous  Airway Management Planned: Simple Face Mask  Additional Equipment:   Intra-op Plan:   Post-operative Plan:   Informed Consent: I have reviewed the patients History and Physical, chart, labs and discussed the procedure including the risks, benefits and alternatives for the proposed anesthesia with the patient or authorized representative who has indicated his/her understanding and acceptance.     Plan Discussed with:   Anesthesia Plan Comments:         Anesthesia Quick Evaluation

## 2014-12-24 NOTE — Transfer of Care (Signed)
Immediate Anesthesia Transfer of Care Note  Patient: [name]  Procedure(s) Performed: Procedure(s): DEBRIDEMENT SOFT TISSUE AND BONE RIGHT FOOT (Right)  Patient Location: PACU  Anesthesia Type:MAC  Level of Consciousness: awake, alert , oriented and patient cooperative  Airway & Oxygen Therapy: Patient Spontanous Breathing and Patient connected to nasal cannula oxygen  Post-op Assessment: Report given to RN, Post -op Vital signs reviewed and stable and Patient moving all extremities X 4  Post vital signs: Reviewed and stable  Last Vitals:  Filed Vitals:   12/24/14 1135  BP: 120/62  Pulse:   Temp:   Resp: 0    Complications: No apparent anesthesia complications

## 2014-12-24 NOTE — H&P (Signed)
HISTORY AND PHYSICAL INTERVAL NOTE:  12/24/2014  11:01 AM  [name]  has presented today for surgery, with the diagnosis of osteomyelitis of 1st metatarsal right foot, ulceration right foot, diabetes mellitus with peripheral neuropathy.  The various methods of treatment have been discussed with the patient.  No guarantees were given.  After consideration of risks, benefits and other options for treatment, the patient has consented to surgery.  I have reviewed the patients' chart and labs.    Patient Vitals for the past 24 hrs:  BP Temp Temp src Pulse Resp SpO2  12/24/14 1040 119/72 mmHg - - - 19 95 %  12/24/14 1035 119/72 mmHg - - - 19 93 %  12/24/14 1030 - - - - 18 92 %  12/24/14 1025 - - - - (!) 0 98 %  12/24/14 1020 116/75 mmHg - - - 20 98 %  12/24/14 1015 116/75 mmHg - - - 19 98 %  12/24/14 1010 116/75 mmHg - - - 17 99 %  12/24/14 1005 116/75 mmHg - - - 18 99 %  12/24/14 1000 128/66 mmHg - - - 13 99 %  12/24/14 0955 135/81 mmHg - - - 18 98 %  12/24/14 0950 132/88 mmHg - - - 10 99 %  12/24/14 0945 - 98.3 F (36.8 C) Oral - 15 97 %  12/24/14 0815 133/71 mmHg - - 91 18 98 %    A history and physical examination was performed in my office.  The patient was reexamined.  There have been no changes to this history and physical examination.  Marcheta Grammes, DPM

## 2014-12-24 NOTE — Op Note (Signed)
OPERATIVE NOTE  DATE OF PROCEDURE:  12/24/2014  SURGEON:   Marcheta Grammes, DPM    OR STAFF:   Circulator: Sue Lush, RN Scrub Person: Karin Lieu, CST RN First Assistant: Towanda Malkin, RN   PREOPERATIVE DIAGNOSIS:   1.  Osteomyelitis of the first metatarsal, right foot 2.  Ulceration to the level of subcutaneous tissue, right foot 3.  Diabetes mellitus with peripheral neuropathy  POSTOPERATIVE DIAGNOSIS: Same  PROCEDURE: Excisional debridement of skin, subcutaneous tissue and bone, right foot  ANESTHESIA:  Monitor Anesthesia Care   HEMOSTASIS:   Pneumatic [ankle] tourniquet set at [250] mmHg  ESTIMATED BLOOD LOSS:   Minimal (<5 cc)  MATERIALS USED:  [None]  INJECTABLES: [0.5% Marcaine plain]  PATHOLOGY:   Aerobic and anaerobic culture and sensitivity  COMPLICATIONS:   None  INDICATIONS: Chronic ulceration of the right foot with recent MRI findings concerning for osteomyelitis of the medial aspect of the first metatarsal head of the right foot.  DESCRIPTION OF THE PROCEDURE:   The patient was brought to the operating room and placed on the operative table in the supine position.  A pneumatic [ankle] tourniquet was applied to the patient's ankle.  Following sedation, the surgical site was anesthetized with 0.5% Marcaine plain.  The foot was then prepped, scrubbed, and draped in the usual sterile technique.  The foot was elevated, exsanguinated and the pneumatic ankle tourniquet inflated to [250] mmHg.    Attention was directed to the medial aspect of the first metatarsal head of the right foot where a full-thickness ulceration was encountered extending to the level of subcutaneous tissue.  Prior to debridement, the wound was fibrotic.  An excisional full thickness debridement of the ulceration was performed using a #15 blade down to the level of the first metatarsal head.  An anaerobic and aerobic culture were obtained.  The remaining wound bed was debrided  free of all fibrotic tissue.  The medial aspect of the first metatarsal bone was debrided using a curet.  Minimal debridement of the bone was performed.  The bone was firm and appeared viable.  The proximal aspect of the wound was reapproximated with 4-0 Prolene in a simple suture technique.  The remaining surgical wound measured 4.2 centimeters in length by 2.4 cm in width by 1.4 cm in depth.  A dressing was applied to the right foot.  The pneumatic ankle tourniquet was deflated and a prompt hyperemic response was noted to all digits of the right foot.  The patient tolerated the procedure well.  The patient was then transferred to [facility] with vital signs stable and vascular status intact to all toes of the operative foot.  Following a period of postoperative monitoring, the patient will be discharged home.

## 2014-12-24 NOTE — Discharge Instructions (Signed)
PICC Insertion, Care After Refer to this sheet in the next few weeks. These instructions provide you with information on caring for yourself after your procedure. Your health care provider may also give you more specific instructions. Your treatment has been planned according to current medical practices, but problems sometimes occur. Call your health care provider if you have any problems or questions after your procedure. WHAT TO EXPECT AFTER THE PROCEDURE After your procedure, it is typical to have the following:  Mild discomfort at the insertion site. This should not last more than a day. HOME CARE INSTRUCTIONS  Rest at home for the remainder of the day after the procedure.  You may bend your arm and move it freely. If your PICC is near or at the bend of your elbow, avoid activity with repeated motion at the elbow.  Avoid lifting heavy objects as instructed by your health care provider.  Avoid using a crutch with the arm on the same side as your PICC. You may need to use a walker. Bandage Care  Keep your PICC bandage (dressing) clean and dry to prevent infection.  Ask your health care provider when you may shower. To keep the dressing dry, cover the PICC with plastic wrap and tape before showering. If the dressing does become wet, replace it right after the shower.  Do not soak in the bath, swim, or use hot tubs when you have a PICC.  Change the PICC dressing as instructed by your health care provider.  Change your PICC dressing if it becomes loose or wet. General PICC Care  Check the PICC insertion site daily for leakage, redness, swelling, or pain.  Flush the PICC as directed by your health care provider. Let your health care provider know right away if the PICC is difficult to flush or does not flush. Do not use force to flush the PICC.  Do not use a syringe that is less than 10 mL to flush the PICC.  Never pull or tug on the PICC.  Avoid blood pressure checks on the arm  with the PICC.  Keep your PICC identification card with you at all times.  Do not take the PICC out yourself. Only a trained health care professional should remove the PICC. SEEK MEDICAL CARE IF:  You have pain in your arm, ear, face, or teeth.  You have fever or chills.  You have drainage from the PICC insertion site.  You have redness or palpate a "cord" around the PICC insertion site.  You cannot flush the catheter. SEEK IMMEDIATE MEDICAL CARE IF:  You have swelling in the arm in which the PICC is inserted. Document Released: 07/24/2013 Document Revised: 10/08/2013 Document Reviewed: 07/24/2013 [facility] Patient Information 2015 [place], [place]. This information is not intended to replace advice given to you by your health care provider. Make sure you discuss any questions you have with your health care provider. PICC Home Guide A peripherally inserted central catheter (PICC) is a long, thin, flexible tube that is inserted into a vein in the upper arm. It is a form of intravenous (IV) access. It is considered to be a "central" line because the tip of the PICC ends in a large vein in your chest. This large vein is called the superior vena cava (SVC). The PICC tip ends in the SVC because there is a lot of blood flow in the SVC. This allows medicines and IV fluids to be quickly distributed throughout the body. The PICC is inserted using a  sterile technique by a specially trained nurse or physician. After the PICC is inserted, a chest X-ray exam is done to be sure it is in the correct place.  A PICC may be placed for different reasons, such as:  To give medicines and liquid nutrition that can only be given through a central line. Examples are:  Certain antibiotic treatments.  Chemotherapy.  Total parenteral nutrition (TPN).  To take frequent blood samples.  To give IV fluids and blood products.  If there is difficulty placing a peripheral intravenous (PIV) catheter. If taken care  of properly, a PICC can remain in place for several months. A PICC can also allow a person to go home from the hospital early. Medicine and PICC care can be managed at home by a family member or home health care team. [place] A PICC? Problems with a PICC can occasionally occur. These may include the following:  A blood clot (thrombus) forming in or at the tip of the PICC. This can cause the PICC to become clogged. A clot-dissolving medicine called tissue plasminogen activator (tPA) can be given through the PICC to help break up the clot.  Inflammation of the vein (phlebitis) in which the PICC is placed. Signs of inflammation may include redness, pain at the insertion site, red streaks, or being able to feel a "cord" in the vein where the PICC is located.  Infection in the PICC or at the insertion site. Signs of infection may include fever, chills, redness, swelling, or pus drainage from the PICC insertion site.  PICC movement (malposition). The PICC tip may move from its original position due to excessive physical activity, forceful coughing, sneezing, or vomiting.  A break or cut in the PICC. It is important to not use scissors near the PICC.  Nerve or tendon irritation or injury during PICC insertion. WHAT SHOULD I KEEP IN MIND ABOUT ACTIVITIES WHEN I HAVE A PICC?  You may bend your arm and move it freely. If your PICC is near or at the bend of your elbow, avoid activity with repeated motion at the elbow.  Rest at home for the remainder of the day following PICC line insertion.  Avoid lifting heavy objects as instructed by your health care provider.  Avoid using a crutch with the arm on the same side as your PICC. You may need to use a walker. WHAT SHOULD I KNOW ABOUT MY PICC DRESSING?  Keep your PICC bandage (dressing) clean and dry to prevent infection.  Ask your health care provider when you may shower. Ask your health care provider to teach you how to  wrap the PICC when you do take a shower.  Change the PICC dressing as instructed by your health care provider.  Change your PICC dressing if it becomes loose or wet. WHAT SHOULD I KNOW ABOUT PICC CARE?  Check the PICC insertion site daily for leakage, redness, swelling, or pain.  Do not take a bath, swim, or use hot tubs when you have a PICC. Cover PICC line with clear plastic wrap and tape to keep it dry while showering.  Flush the PICC as directed by your health care provider. Let your health care provider know right away if the PICC is difficult to flush or does not flush. Do not use force to flush the PICC.  Do not use a syringe that is less than 10 mL to flush the PICC.  Never pull or tug on the PICC.  Avoid blood pressure checks on the arm with the PICC.  Keep your PICC identification card with you at all times.  Do not take the PICC out yourself. Only a trained clinical professional should remove the PICC. SEEK IMMEDIATE MEDICAL CARE IF:  Your PICC is accidentally pulled all the way out. If this happens, cover the insertion site with a bandage or gauze dressing. Do not throw the PICC away. Your health care provider will need to inspect it.  Your PICC was tugged or pulled and has partially come out. Do not  push the PICC back in.  There is any type of drainage, redness, or swelling where the PICC enters the skin. Peripherally Inserted Central Catheter/Midline Placement  The IV Nurse has discussed with the patient and/or persons authorized to consent for the patient, the purpose of this procedure and the potential benefits and risks involved with this procedure.  The benefits include less needle sticks, lab draws from the catheter and patient may be discharged home with the catheter.  Risks include, but not limited to, infection, bleeding, blood clot (thrombus formation), and puncture of an artery; nerve damage and irregular heat beat.  Alternatives to this procedure were also  discussed.  PICC/Midline Placement Documentation  PICC / Midline Single Lumen 81/19/14 PICC Right Basilic 45 cm 2 cm (Active)  Indication for Insertion or Continuance of Line Limited venous access - need for IV therapy >5 days (PICC only) 12/24/2014  9:55 AM  Exposed Catheter (cm) 2 cm 12/24/2014  9:32 AM  Site Assessment Clean;Dry;Intact 12/24/2014  9:55 AM  Line Status Infusing 12/24/2014  9:55 AM  Dressing Type Transparent;Securing device 12/24/2014  9:55 AM  Dressing Status Clean;Dry;Intact;Antimicrobial disc in place 12/24/2014  9:55 AM  Line Care Connections checked and tightened 12/24/2014  9:55 AM  Dressing Intervention New dressing 12/24/2014  9:32 AM  Dressing Change Due 12/31/14 12/24/2014  9:32 AM       [name], Essie Hart 12/24/2014, 9:58 AM  You cannot flush the PICC, it is difficult to flush, or the PICC leaks around the insertion site when it is flushed.  You hear a "flushing" sound when the PICC is flushed.  You have pain, discomfort, or numbness in your arm, shoulder, or jaw on the same side as the PICC.  You feel your heart "racing" or skipping beats.  You notice a hole or tear in the PICC.  You develop chills or a fever. MAKE SURE YOU:   Understand these instructions.  Will watch your condition.  Will get help right away if you are not doing well or get worse. Document Released: 04/09/2003 Document Revised: 02/17/2014 Document Reviewed: 06/10/2013 [facility] Patient Information 2015 [place], [place]. This information is not intended to replace advice given to you by your health care provider. Make sure you discuss any questions you have with your health care provider.

## 2014-12-24 NOTE — Progress Notes (Signed)
Peripherally Inserted Central Catheter/Midline Placement  The IV Nurse has discussed with the patient and/or persons authorized to consent for the patient, the purpose of this procedure and the potential benefits and risks involved with this procedure.  The benefits include less needle sticks, lab draws from the catheter and patient may be discharged home with the catheter.  Risks include, but not limited to, infection, bleeding, blood clot (thrombus formation), and puncture of an artery; nerve damage and irregular heat beat.  Alternatives to this procedure were also discussed.  PICC/Midline Placement Documentation  PICC / Midline Single Lumen 93/90/30 PICC Right Basilic 45 cm 2 cm (Active)  Indication for Insertion or Continuance of Line Limited venous access - need for IV therapy >5 days (PICC only) 12/24/2014  9:55 AM  Exposed Catheter (cm) 2 cm 12/24/2014  9:32 AM  Site Assessment Clean;Dry;Intact 12/24/2014  9:55 AM  Line Status Infusing 12/24/2014  9:55 AM  Dressing Type Transparent;Securing device 12/24/2014  9:55 AM  Dressing Status Clean;Dry;Intact;Antimicrobial disc in place 12/24/2014  9:55 AM  Line Care Connections checked and tightened 12/24/2014  9:55 AM  Dressing Intervention New dressing 12/24/2014  9:32 AM  Dressing Change Due 12/31/14 12/24/2014  9:32 AM       Jossie Ng, Essie Hart 12/24/2014, 9:56 AM

## 2014-12-24 NOTE — Anesthesia Procedure Notes (Signed)
Procedure Name: MAC Date/Time: 12/24/2014 11:40 AM Performed by: Michele Rockers Pre-anesthesia Checklist: Patient identified, Emergency Drugs available, Suction available, Timeout performed and Patient being monitored Patient Re-evaluated:Patient Re-evaluated prior to inductionOxygen Delivery Method: Non-rebreather mask

## 2014-12-25 ENCOUNTER — Encounter (HOSPITAL_COMMUNITY): Payer: Self-pay | Admitting: Podiatry

## 2014-12-25 NOTE — Progress Notes (Signed)
CM called by Barnett Applebaum, RN with Day [facility] that pt needs HH arranged for IV AB. Pt had surgery on 12/24/14, PICC line inserted, and IV AB given prior to discharge from surgery. Pt agrees with [facility] for [facility] RN. Romualdo Bolk of [facility] is aware and will collect pts information from the chart. [facility] will contact pt today to arrange time to start IV AB today.

## 2014-12-27 LAB — WOUND CULTURE

## 2014-12-29 LAB — ANAEROBIC CULTURE: GRAM STAIN: NONE SEEN

## 2015-01-03 ENCOUNTER — Emergency Department (HOSPITAL_COMMUNITY)
Admission: EM | Admit: 2015-01-03 | Discharge: 2015-01-03 | Disposition: A | Discharge status: Home / Self Care | Payer: BLUE CROSS/BLUE SHIELD | Attending: Emergency Medicine | Admitting: Emergency Medicine

## 2015-01-03 ENCOUNTER — Encounter (HOSPITAL_COMMUNITY): Payer: Self-pay

## 2015-01-03 ENCOUNTER — Emergency Department (HOSPITAL_COMMUNITY): Payer: BLUE CROSS/BLUE SHIELD

## 2015-01-03 DIAGNOSIS — Z794 Long term (current) use of insulin: Secondary | ICD-10-CM | POA: Diagnosis not present

## 2015-01-03 DIAGNOSIS — Z87891 Personal history of nicotine dependence: Secondary | ICD-10-CM | POA: Diagnosis not present

## 2015-01-03 DIAGNOSIS — E669 Obesity, unspecified: Secondary | ICD-10-CM | POA: Diagnosis not present

## 2015-01-03 DIAGNOSIS — I10 Essential (primary) hypertension: Secondary | ICD-10-CM | POA: Insufficient documentation

## 2015-01-03 DIAGNOSIS — R011 Cardiac murmur, unspecified: Secondary | ICD-10-CM | POA: Diagnosis not present

## 2015-01-03 DIAGNOSIS — I252 Old myocardial infarction: Secondary | ICD-10-CM | POA: Insufficient documentation

## 2015-01-03 DIAGNOSIS — Y848 Other medical procedures as the cause of abnormal reaction of the patient, or of later complication, without mention of misadventure at the time of the procedure: Secondary | ICD-10-CM | POA: Diagnosis not present

## 2015-01-03 DIAGNOSIS — E119 Type 2 diabetes mellitus without complications: Secondary | ICD-10-CM | POA: Insufficient documentation

## 2015-01-03 DIAGNOSIS — Z79899 Other long term (current) drug therapy: Secondary | ICD-10-CM | POA: Diagnosis not present

## 2015-01-03 DIAGNOSIS — Z8673 Personal history of transient ischemic attack (TIA), and cerebral infarction without residual deficits: Secondary | ICD-10-CM | POA: Insufficient documentation

## 2015-01-03 DIAGNOSIS — T80211A Bloodstream infection due to central venous catheter, initial encounter: Secondary | ICD-10-CM | POA: Insufficient documentation

## 2015-01-03 DIAGNOSIS — E785 Hyperlipidemia, unspecified: Secondary | ICD-10-CM | POA: Diagnosis not present

## 2015-01-03 DIAGNOSIS — I2510 Atherosclerotic heart disease of native coronary artery without angina pectoris: Secondary | ICD-10-CM | POA: Diagnosis not present

## 2015-01-03 DIAGNOSIS — T82898A Other specified complication of vascular prosthetic devices, implants and grafts, initial encounter: Secondary | ICD-10-CM

## 2015-01-03 MED ORDER — FLUORESCEIN SODIUM 1 MG OP STRP: 1.0000 | ORAL_STRIP | Freq: Once | OPHTHALMIC | Status: DC

## 2015-01-03 MED ORDER — TETRACAINE HCL 0.5 % OP SOLN
1.0000 [drp] | Freq: Once | OPHTHALMIC | Status: DC
Start: 2015-01-03 — End: 2015-01-03

## 2015-01-03 NOTE — ED Notes (Signed)
Pt states he had a PICC placed 12/17/2014 for antibiotic therapy related to sore on his foot. Patient states today somehow the catheter has "came out".

## 2015-01-03 NOTE — ED Notes (Signed)
PICC nurse at bedside.

## 2015-01-03 NOTE — Discharge Instructions (Signed)
PICC Removal A peripherally inserted central catheter (PICC) is a long, thin, flexible tube that a health care provider can insert into a vein in your upper arm. It is a type of IV. Having a PICC in place gives health care providers quick access to your veins. It is a good way to distribute medicines and fluids quickly throughout your body. LET [facility] CARE PROVIDER KNOW ABOUT:  Any allergies you have.  All medicines you are taking, including vitamins, herbs, eye drops, creams, and over-the-counter medicines.  Previous problems you or members of your family have had with the use of anesthetics.  Any blood disorders you have.  Previous surgeries you have had.  Medical conditions you have. RISKS AND COMPLICATIONS Generally, this is a safe procedure. However, as with any procedure, problems can occur. Possible problems include:  Bleeding.  Infection. BEFORE THE PROCEDURE You need an order from your health care provider to have your PICC removed. Only a health care provider trained in PICC removal should take it out.You may have your PICC removed in the hospital or in an outpatient setting. PROCEDURE Having a PICC removed is usually painless. Removal of the tape that holds the PICC in place may be the most uncomfortable part. Do not take out the PICC yourself. Only a trained clinical professional, such as a PICC nurse, should remove it. If your health care provider thinks your PICC is infected, the tip may be sent to the lab for testing. After taking out your PICC, your health care provider may:   Hold gentle pressure on the exit site.  Apply some antibiotic ointment.  Place a small bandage over the insertion site. AFTER THE PROCEDURE You should be able to remove the bandage after 24 hours. Follow all your health care provider's instructions.   Keep the insertion site clean by washing it gently with soap and water.  Do not pick or remove a scab.  Avoid strenuous physical  activity for a day or two.  Let your health care provider know if you develop redness, soreness, bleeding, swelling, or drainage from the insertion site.  Let your health care provider know if you develop chills or fever. Document Released: 03/23/2010 Document Revised: 10/08/2013 Document Reviewed: 07/26/2013 [facility] Patient Information 2015 [place], [place]. This information is not intended to replace advice given to you by your health care provider. Make sure you discuss any questions you have with your health care provider.

## 2015-01-03 NOTE — ED Provider Notes (Signed)
CSN: [id number]     Arrival date & time 01/03/15  1429 History  This chart was scribed for Orpah Greek, MD by Peyton Bottoms, ED Scribe. This patient was seen in room [facility]12 and the patient's care was started at 2:42 PM.   Chief Complaint  Patient presents with  . Vascular Access Problem   The history is provided by the patient. No language interpreter was used.   HPI Comments: [name] is a 59 y.o. male with a PMHx of diabetes, HTN, CAD, MI, DJD, hypercholesterolemia, heart murmur and celiac disease, who presents to the Emergency Department complaining of PICC line that came out from right antecubital when patient was taking medications earlier this morning. He denies associated pain.  Past Medical History  Diagnosis Date  . Diabetes mellitus   . HTN (hypertension)   . CAD (coronary artery disease)     status post PCI and bare metal stenting of   the proximal right coronary artery with placement of a Driver bare   metal stent.   . Myocardial infarction   . Hypercholesterolemia   . Obesity   . Hyperlipidemia   . DJD (degenerative joint disease)   . First degree AV block     with intermittent second degree types I and    II heart block.   . Oral candidiasis   . Depression   . Heart murmur 1974  . Heart attack 2008  . Gout   . Celiac disease    Past Surgical History  Procedure Laterality Date  . Right ankle fracture open reduction internal fixation.  2003  . Laminotomy  2001, 2006      Right L4-5 interlaminar laminotomy with excision of herniated   disc with operating microscope.  . Neck surgery      cervical disc  . Esophagogastroduodenoscopy  08/2008  . Colonoscopy  08/2008  . Orif wrist fracture Right   . Wound debridement Right 12/24/2014    Procedure: DEBRIDEMENT SOFT TISSUE AND BONE RIGHT FOOT;  Surgeon: Caprice Beaver, DPM;  Location: [facility];  Service: Podiatry;  Laterality: Right;   Family History  Problem Relation Age of Onset  . Cancer  Mother   . Cancer Father   . Cancer Sister    History  Substance Use Topics  . Smoking status: Former Smoker -- 1.00 packs/day for 10 years    Types: Cigarettes    Quit date: 12/23/1970  . Smokeless tobacco: Not on file     Comment: quit in 1979  . Alcohol Use: No   Review of Systems  Skin:       PICC line out of place  All other systems reviewed and are negative.  Allergies  Vicodin and Statins  Home Medications   Prior to Admission medications   Medication Sig Start Date End Date Taking? Authorizing Provider  empagliflozin (JARDIANCE) 10 MG TABS tablet Take 10 mg by mouth daily. 11/18/14  Yes Kathyrn Drown, MD  halobetasol (ULTRAVATE) 0.05 % ointment Apply topically 2 (two) times daily. 06/09/14  Yes Kathyrn Drown, MD  insulin glargine (LANTUS) 100 unit/mL SOPN May titrate up to 18 units qhs 09/05/14  Yes Kathyrn Drown, MD  oxyCODONE-acetaminophen (PERCOCET) 10-325 MG per tablet Take 1 tablet by mouth every 4 (four) hours as needed for pain. As needed for pain 12/17/14  Yes Kathyrn Drown, MD  rosuvastatin (CRESTOR) 5 MG tablet Take 1 tablet (5 mg total) by mouth at bedtime. 09/05/14 09/05/15 Yes Scott  A Luking, MD   Triage Vitals: BP 135/79 mmHg  Pulse 105  Temp(Src) 98.2 F (36.8 C) (Oral)  Resp 16  Ht 6\' 3"  (1.905 m)  Wt 265 lb (120.203 kg)  BMI 33.12 kg/m2  SpO2 98%  Physical Exam  Constitutional: He is oriented to person, place, and time. He appears well-developed and well-nourished. No distress.  HENT:  Head: Normocephalic and atraumatic.  Right Ear: Hearing normal.  Left Ear: Hearing normal.  Nose: Nose normal.  Mouth/Throat: Oropharynx is clear and moist and mucous membranes are normal.  Eyes: Conjunctivae and EOM are normal. Pupils are equal, round, and reactive to light.  Neck: Normal range of motion. Neck supple.  Cardiovascular: Regular rhythm, S1 normal and S2 normal.  Exam reveals no gallop and no friction rub.   No murmur heard. Pulmonary/Chest:  Effort normal and breath sounds normal. No respiratory distress. He exhibits no tenderness.  Abdominal: Soft. Normal appearance and bowel sounds are normal. There is no hepatosplenomegaly. There is no tenderness. There is no rebound, no guarding, no tenderness at McBurney's point and negative Murphy's sign. No hernia.  Musculoskeletal: Normal range of motion.  PICC line site is clean with no bleeding, swelling, redness or drainage.  Neurological: He is alert and oriented to person, place, and time. He has normal strength. No cranial nerve deficit or sensory deficit. Coordination normal. GCS eye subscore is 4. GCS verbal subscore is 5. GCS motor subscore is 6.  Skin: Skin is warm, dry and intact. No rash noted. No cyanosis.  Psychiatric: He has a normal mood and affect. His speech is normal and behavior is normal. Thought content normal.  Nursing note and vitals reviewed.  ED Course  Procedures (including critical care time)  DIAGNOSTIC STUDIES: Oxygen Saturation is 98% on RA, normal by my interpretation.    COORDINATION OF CARE: 2:45 PM- Discussed plans to contact on-call nurse to replace PICC line. Pt advised of plan for treatment and pt agrees.  Labs Review Labs Reviewed - No data to display  Imaging Review Dg Chest 1 View  01/03/2015   CLINICAL DATA:  Inadvertent PICC line withdrawal  EXAM: CHEST  1 VIEW  COMPARISON:  None.  FINDINGS: Cardiac shadow is within normal limits. The lungs are clear bilaterally. Postsurgical changes are noted in the cervical spine. A right-sided PICC line is noted with the tip in the mid axillary vein. No other focal abnormality is seen.  IMPRESSION: PICC line as described.   Electronically Signed   By: Inez Catalina M.D.   On: 01/03/2015 15:16     EKG Interpretation None     MDM   Final diagnoses:  None   PICC line problem  Patient presents to the ER for evaluation of problems with his PICC line. He reports that he accidentally partially pulled it  out. Chest x-ray shows that the tip of the catheter is in the mid axillary vein. It is very difficult, however, to flush. Patient reports that it took him 3 hours to administer his antibiotics this morning. PICC line nurse was asked to evaluate patient for replacement of his PICC line here in the ER.  I personally performed the services described in this documentation, which was scribed in my presence. The recorded information has been reviewed and is accurate.    Orpah Greek, MD 01/03/15 [phone]

## 2015-01-03 NOTE — Progress Notes (Signed)
Peripherally Inserted Central Catheter/Midline Placement  The IV Nurse has discussed with the patient and/or persons authorized to consent for the patient, the purpose of this procedure and the potential benefits and risks involved with this procedure.  The benefits include less needle sticks, lab draws from the catheter and patient may be discharged home with the catheter.  Risks include, but not limited to, infection, bleeding, blood clot (thrombus formation), and puncture of an artery; nerve damage and irregular heat beat.  Alternatives to this procedure were also discussed.  PICC/Midline Placement Documentation  PICC / Midline Single Lumen 01/03/15 PICC Right 43 cm 0 cm (Active)  Indication for Insertion or Continuance of Line Home intravenous therapies (PICC only) 01/03/2015  7:11 PM  Exposed Catheter (cm) 0 cm 01/03/2015  7:11 PM  Site Assessment Clean;Dry;Intact 01/03/2015  7:11 PM  Line Status Flushed;Saline locked;Blood return noted 01/03/2015  7:11 PM  Dressing Type Transparent 01/03/2015  7:11 PM  Dressing Status Clean;Dry;Intact;Antimicrobial disc in place 01/03/2015  7:11 PM  Line Care Other (Comment) 01/03/2015  7:11 PM  Dressing Intervention New dressing 01/03/2015  7:11 PM  Dressing Change Due 01/10/15 01/03/2015  7:11 PM       Gordan Payment 01/03/2015, 7:15 PM

## 2015-01-03 NOTE — ED Notes (Signed)
MD at bedside. 

## 2015-01-03 NOTE — ED Notes (Signed)
Pt has PICC line placed on 12/17/14. Pt was giving himself antibiotic when PICC line came out. Unable to assess the extent of how far out the catheter is at this time.   PICC nurse has been called.

## 2015-01-06 ENCOUNTER — Encounter: Payer: Self-pay | Admitting: Endocrinology

## 2015-01-08 ENCOUNTER — Encounter (HOSPITAL_COMMUNITY): Payer: Self-pay

## 2015-01-08 ENCOUNTER — Other Ambulatory Visit: Payer: Self-pay | Admitting: Podiatry

## 2015-01-08 ENCOUNTER — Inpatient Hospital Stay (HOSPITAL_COMMUNITY): Admission: RE | Admit: 2015-01-08 | Payer: BLUE CROSS/BLUE SHIELD | Source: Ambulatory Visit

## 2015-01-12 ENCOUNTER — Encounter (HOSPITAL_COMMUNITY): Payer: Self-pay | Admitting: *Deleted

## 2015-01-12 ENCOUNTER — Ambulatory Visit (HOSPITAL_COMMUNITY): Payer: BLUE CROSS/BLUE SHIELD

## 2015-01-12 ENCOUNTER — Ambulatory Visit (HOSPITAL_COMMUNITY): Payer: BLUE CROSS/BLUE SHIELD | Admitting: Anesthesiology

## 2015-01-12 ENCOUNTER — Ambulatory Visit (HOSPITAL_COMMUNITY)
Admission: RE | Admit: 2015-01-12 | Discharge: 2015-01-12 | Disposition: A | Discharge status: Home / Self Care | Payer: BLUE CROSS/BLUE SHIELD | Source: Ambulatory Visit | Attending: Podiatry | Admitting: Podiatry

## 2015-01-12 ENCOUNTER — Encounter (HOSPITAL_COMMUNITY)
Admission: RE | Disposition: A | Discharge status: Home / Self Care | Payer: Self-pay | Source: Ambulatory Visit | Attending: Podiatry

## 2015-01-12 DIAGNOSIS — Z9889 Other specified postprocedural states: Secondary | ICD-10-CM

## 2015-01-12 DIAGNOSIS — E1142 Type 2 diabetes mellitus with diabetic polyneuropathy: Secondary | ICD-10-CM | POA: Diagnosis not present

## 2015-01-12 DIAGNOSIS — M869 Osteomyelitis, unspecified: Secondary | ICD-10-CM

## 2015-01-12 DIAGNOSIS — L97519 Non-pressure chronic ulcer of other part of right foot with unspecified severity: Secondary | ICD-10-CM | POA: Insufficient documentation

## 2015-01-12 DIAGNOSIS — M868X7 Other osteomyelitis, ankle and foot: Secondary | ICD-10-CM | POA: Diagnosis not present

## 2015-01-12 HISTORY — PX: AMPUTATION: SHX166

## 2015-01-12 LAB — GLUCOSE, CAPILLARY
GLUCOSE-CAPILLARY: 84 mg/dL (ref 70–99)
GLUCOSE-CAPILLARY: 128 mg/dL — AB (ref 70–99)
GLUCOSE-CAPILLARY: 113 mg/dL — AB (ref 70–99)
GLUCOSE-CAPILLARY: 76 mg/dL (ref 70–99)

## 2015-01-12 SURGERY — AMPUTATION, FOOT, RAY
Anesthesia: Monitor Anesthesia Care | Laterality: Right

## 2015-01-12 MED ORDER — LIDOCAINE HCL (PF) 1 % IJ SOLN
INTRAMUSCULAR | Status: DC | PRN
  Administered 2015-01-12: 10 mL

## 2015-01-12 MED ORDER — FENTANYL CITRATE 0.05 MG/ML IJ SOLN: 25.0000 ug | INTRAMUSCULAR | Status: DC | PRN

## 2015-01-12 MED ORDER — FENTANYL CITRATE 0.05 MG/ML IJ SOLN
25.0000 ug | INTRAMUSCULAR | Status: AC
  Administered 2015-01-12 (×2): 25 ug via INTRAVENOUS

## 2015-01-12 MED ORDER — LACTATED RINGERS IV SOLN
INTRAVENOUS | Status: DC | PRN
Start: 2015-01-12 — End: 2015-01-12
  Administered 2015-01-12: 09:00:00 via INTRAVENOUS

## 2015-01-12 MED ORDER — PROPOFOL INFUSION 10 MG/ML OPTIME
INTRAVENOUS | Status: DC | PRN
  Administered 2015-01-12: 150 ug/kg/min via INTRAVENOUS

## 2015-01-12 MED ORDER — ONDANSETRON HCL 4 MG/2ML IJ SOLN: 4.0000 mg | Freq: Once | INTRAMUSCULAR | Status: DC | PRN

## 2015-01-12 MED ORDER — DEXTROSE 50 % IV SOLN
INTRAVENOUS | Status: AC
  Filled 2015-01-12: qty 50

## 2015-01-12 MED ORDER — FENTANYL CITRATE 0.05 MG/ML IJ SOLN
INTRAMUSCULAR | Status: DC | PRN
  Administered 2015-01-12 (×4): 25 ug via INTRAVENOUS

## 2015-01-12 MED ORDER — DEXTROSE 50 % IV SOLN
INTRAVENOUS | Status: DC | PRN
  Administered 2015-01-12: 12.5 g via INTRAVENOUS

## 2015-01-12 MED ORDER — MIDAZOLAM HCL 5 MG/5ML IJ SOLN
INTRAMUSCULAR | Status: DC | PRN
  Administered 2015-01-12 (×2): 1 mg via INTRAVENOUS

## 2015-01-12 MED ORDER — BUPIVACAINE HCL (PF) 0.5 % IJ SOLN
INTRAMUSCULAR | Status: AC
  Filled 2015-01-12: qty 30

## 2015-01-12 MED ORDER — DEXTROSE 50 % IV SOLN
12.5000 g | Freq: Once | INTRAVENOUS | Status: AC
  Administered 2015-01-12: 12.5 g via INTRAVENOUS

## 2015-01-12 MED ORDER — FENTANYL CITRATE 0.05 MG/ML IJ SOLN
INTRAMUSCULAR | Status: AC
  Filled 2015-01-12: qty 2

## 2015-01-12 MED ORDER — VANCOMYCIN HCL 10 G IV SOLR
2000.0000 mg | Freq: Once | INTRAVENOUS | Status: AC
  Administered 2015-01-12: 2000 mg via INTRAVENOUS
  Filled 2015-01-12: qty 2000

## 2015-01-12 MED ORDER — BUPIVACAINE HCL (PF) 0.5 % IJ SOLN
INTRAMUSCULAR | Status: DC | PRN
  Administered 2015-01-12: 20 mL

## 2015-01-12 MED ORDER — LIDOCAINE HCL (PF) 1 % IJ SOLN
INTRAMUSCULAR | Status: AC
  Filled 2015-01-12: qty 30

## 2015-01-12 MED ORDER — 0.9 % SODIUM CHLORIDE (POUR BTL) OPTIME
TOPICAL | Status: DC | PRN
  Administered 2015-01-12: 1000 mL

## 2015-01-12 MED ORDER — LACTATED RINGERS IV SOLN
INTRAVENOUS | Status: DC
  Administered 2015-01-12: 10:00:00 via INTRAVENOUS

## 2015-01-12 MED ORDER — SODIUM CHLORIDE 0.9 % IV SOLN
INTRAVENOUS | Status: DC | PRN
  Administered 2015-01-12: 10:00:00 via INTRAVENOUS

## 2015-01-12 MED ORDER — MIDAZOLAM HCL 2 MG/2ML IJ SOLN
INTRAMUSCULAR | Status: AC
  Filled 2015-01-12: qty 2

## 2015-01-12 MED ORDER — MIDAZOLAM HCL 2 MG/2ML IJ SOLN
1.0000 mg | INTRAMUSCULAR | Status: DC | PRN
  Administered 2015-01-12: 2 mg via INTRAVENOUS

## 2015-01-12 MED ORDER — PROPOFOL 10 MG/ML IV BOLUS
INTRAVENOUS | Status: AC
  Filled 2015-01-12: qty 20

## 2015-01-12 MED ORDER — LIDOCAINE HCL (CARDIAC) 10 MG/ML IV SOLN
INTRAVENOUS | Status: DC | PRN
  Administered 2015-01-12: 50 mg via INTRAVENOUS

## 2015-01-12 SURGICAL SUPPLY — 45 items
APL SKNCLS STERI-STRIP NONHPOA (GAUZE/BANDAGES/DRESSINGS) ×1
BAG HAMPER (MISCELLANEOUS) ×2 IMPLANT
BANDAGE ELASTIC 4 VELCRO NS (GAUZE/BANDAGES/DRESSINGS) ×2 IMPLANT
BANDAGE ESMARK 4X12 BL STRL LF (DISPOSABLE) ×1 IMPLANT
BENZOIN TINCTURE PRP APPL 2/3 (GAUZE/BANDAGES/DRESSINGS) ×2 IMPLANT
BLADE 15 SAFETY STRL DISP (BLADE) ×3 IMPLANT
BLADE AVERAGE 25X9 (BLADE) ×2 IMPLANT
BNDG CMPR 12X4 ELC STRL LF (DISPOSABLE) ×1
BNDG CONFORM 2 STRL LF (GAUZE/BANDAGES/DRESSINGS) ×2 IMPLANT
BNDG ESMARK 4X12 BLUE STRL LF (DISPOSABLE) ×2
BNDG GAUZE ELAST 4 BULKY (GAUZE/BANDAGES/DRESSINGS) ×2 IMPLANT
CLOTH BEACON ORANGE TIMEOUT ST (SAFETY) ×2 IMPLANT
COVER LIGHT HANDLE STERIS (MISCELLANEOUS) ×4 IMPLANT
CUFF TOURNIQUET SINGLE 18IN (TOURNIQUET CUFF) ×1 IMPLANT
DECANTER SPIKE VIAL GLASS SM (MISCELLANEOUS) ×3 IMPLANT
DRSG ADAPTIC 3X8 NADH LF (GAUZE/BANDAGES/DRESSINGS) ×1 IMPLANT
ELECT REM PT RETURN 9FT ADLT (ELECTROSURGICAL) ×2
ELECTRODE REM PT RTRN 9FT ADLT (ELECTROSURGICAL) ×1 IMPLANT
FORMALIN 10 PREFIL 120ML (MISCELLANEOUS) ×2 IMPLANT
FORMALIN 10 PREFIL 480ML (MISCELLANEOUS) ×1 IMPLANT
GLOVE BIO SURGEON STRL SZ7.5 (GLOVE) ×3 IMPLANT
GLOVE BIOGEL PI IND STRL 7.0 (GLOVE) IMPLANT
GLOVE BIOGEL PI IND STRL 7.5 (GLOVE) IMPLANT
GLOVE BIOGEL PI INDICATOR 7.0 (GLOVE) ×2
GLOVE BIOGEL PI INDICATOR 7.5 (GLOVE) ×1
GLOVE SKINSENSE NS SZ6.5 (GLOVE) ×1
GLOVE SKINSENSE NS SZ7.0 (GLOVE) ×1
GLOVE SKINSENSE STRL SZ6.5 (GLOVE) IMPLANT
GLOVE SKINSENSE STRL SZ7.0 (GLOVE) IMPLANT
GOWN STRL REUS W/TWL LRG LVL3 (GOWN DISPOSABLE) ×5 IMPLANT
KIT ROOM TURNOVER APOR (KITS) ×2 IMPLANT
NDL HYPO 27GX1-1/4 (NEEDLE) ×4 IMPLANT
NEEDLE HYPO 27GX1-1/4 (NEEDLE) ×8 IMPLANT
NS IRRIG 1000ML POUR BTL (IV SOLUTION) ×2 IMPLANT
PACK BASIC LIMB (CUSTOM PROCEDURE TRAY) ×2 IMPLANT
PAD ARMBOARD 7.5X6 YLW CONV (MISCELLANEOUS) ×2 IMPLANT
SET BASIN LINEN APH (SET/KITS/TRAYS/PACK) ×2 IMPLANT
SOL PREP PROV IODINE SCRUB 4OZ (MISCELLANEOUS) ×2 IMPLANT
SPONGE LAP 18X18 X RAY DECT (DISPOSABLE) ×2 IMPLANT
STRIP CLOSURE SKIN 1/2X4 (GAUZE/BANDAGES/DRESSINGS) ×3 IMPLANT
SUT PROLENE 4 0 PS 2 18 (SUTURE) ×5 IMPLANT
SWAB CULTURE LIQ STUART DBL (MISCELLANEOUS) ×1 IMPLANT
SYR CONTROL 10ML LL (SYRINGE) ×3 IMPLANT
TOWEL OR 17X26 4PK STRL BLUE (TOWEL DISPOSABLE) ×2 IMPLANT
TUBE ANAEROBIC PORT A CUL  W/M (MISCELLANEOUS) ×1 IMPLANT

## 2015-01-12 NOTE — Transfer of Care (Signed)
Immediate Anesthesia Transfer of Care Note  Patient: [name]  Procedure(s) Performed: Procedure(s): PARTIAL AMPUTATION 1ST RAY RIGHT FOOT (Amputation of right great toe and portion of the first metatarsal bone right foot) (Right)  Patient Location: PACU  Anesthesia Type:MAC  Level of Consciousness: awake, alert , oriented and patient cooperative  Airway & Oxygen Therapy: Patient Spontanous Breathing and Patient connected to nasal cannula oxygen  Post-op Assessment: Report given to RN and Post -op Vital signs reviewed and stable  Post vital signs: Reviewed and stable  Last Vitals:  Filed Vitals:   01/12/15 1020  BP: 112/80  Resp: 16    Complications: No apparent anesthesia complications

## 2015-01-12 NOTE — Addendum Note (Signed)
Addendum  created 01/12/15 1224 by Vista Deck, CRNA   Modules edited: Anesthesia Events, Narrator   Narrator:  Narrator: Event Log Edited

## 2015-01-12 NOTE — H&P (Signed)
HISTORY AND PHYSICAL INTERVAL NOTE:  01/12/2015  9:44 AM  [name]  has presented today for surgery, with the diagnosis of osteomyelitis of 1st metatarsal.  The various methods of treatment have been discussed with the patient.  No guarantees were given.  After consideration of risks, benefits and other options for treatment, the patient has consented to surgery.  I have reviewed the patients' chart and labs.    Patient Vitals for the past 24 hrs:  Height Weight  01/12/15 0931 6\' 3"  (1.905 m) 265 lb (120.203 kg)    A history and physical examination was performed in my office.  The patient was reexamined.  There have been no changes to this history and physical examination.  Marcheta Grammes, DPM

## 2015-01-12 NOTE — Addendum Note (Signed)
Addendum  created 01/12/15 1203 by Vista Deck, CRNA   Modules edited: Anesthesia Events, Anesthesia Medication Administration, Narrator   Narrator:  Narrator: Event Log Edited

## 2015-01-12 NOTE — Op Note (Signed)
OPERATIVE NOTE  DATE OF PROCEDURE:  01/12/2015  SURGEON:  Marcheta Grammes, DPM  OR STAFF:   Circulator: Glory Rosebush, RN Scrub Person: Romero Liner, CST RN First Assistant: Towanda Malkin, RN.   PREOPERATIVE DIAGNOSIS:   1.  Osteomyelitis of the first metatarsal, right foot. 2.  Ulceration with necrosis of bone, right foot. 3.  Diabetes mellitus with peripheral neuropathy.  POSTOPERATIVE DIAGNOSIS:  [Same].  PROCEDURE:  Partial first ray amputation, right foot.  ANESTHESIA:  Monitor Anesthesia Care.   HEMOSTASIS:  Pneumatic [ankle] tourniquet set at [250] mmHg.  ESTIMATED BLOOD LOSS:  [Minimal (<5 cc)].  MATERIALS USED:  [None].  INJECTABLES:  [0.5% Marcaine plain].  PATHOLOGY: 1.  Aerobic culture. 2.  Anaerobic culture. 3.  Right hallux with first metatarsal bone. 4.  First metatarsal bone for margins.  COMPLICATIONS:  [None].  INDICATIONS:  Chronic nonhealing ulceration of the right foot with erosive changes of the first metatarsal head and base of the proximal phalanx on plain film radiography.  DESCRIPTION OF THE PROCEDURE:  The patient was brought to the operating room and placed on the operative table in the supine position.  A pneumatic [ankle] tourniquet was applied to the patient's ankle.  Following sedation, the surgical site was anesthetized with 0.5% Marcaine plain.  The foot was then prepped, scrubbed, and draped in the usual sterile technique.  The foot was elevated, exsanguinated and the pneumatic [ankle] tourniquet inflated to [250] mmHg.    2 converging semielliptical incisions were made encompassing the right hallux circumferentially.  Dissection was continued deep down to the level of the first metatarsophalangeal joint.  The joint was disarticulated and the hallux passed from the operative field.  There was fragmentation of the base of the middle phalanx and head of the proximal phalanx consistent with osteomyelitis.  An aerobic and  anaerobic culture were obtained.    The incision was extended proximally overlying the first metatarsal bone.  Using a power bone saw an osteotomy was performed through the shaft of the first metatarsal bone.  The resected bone was freed of all soft tissue attachments and passed from the operative field.  This segment of bone and the right hallux were sent to pathology as one specimen.  The surgical wound was irrigated with copious amounts of sterile irrigant.  A second osteotomy was performed proximal to the first osteotomy.  The segment of bone was sent to pathology for margins.  All soft tissue was divided down to healthy viable tissue.  The surgical wound was again irrigated with copious amounts of sterile irrigant.  The skin was reapproximated using 4-0 Prolene.  A sterile compressive dressing was applied to the right foot.  The pneumatic ankle tourniquet was deflated and a prompt hyperemic response was noted to all remaining digits of the right foot.   The patient tolerated the procedure well.  The patient was then transferred to [facility] with vital signs stable and vascular status intact to all toes of the operative foot.  Following a period of postoperative monitoring, the patient will be discharged home.

## 2015-01-12 NOTE — Anesthesia Preprocedure Evaluation (Signed)
Anesthesia Evaluation  Patient identified by MRN, date of birth, ID band Patient awake    Reviewed: Allergy & Precautions, NPO status , Patient's Chart, lab work & pertinent test results  Airway Mallampati: I  TM Distance: >3 FB     Dental  (+) Teeth Intact   Pulmonary former smoker,  breath sounds clear to auscultation        Cardiovascular hypertension, Pt. on medications + CAD, + Past MI, + Cardiac Stents and + Peripheral Vascular Disease + dysrhythmias Rhythm:Regular Rate:Normal     Neuro/Psych PSYCHIATRIC DISORDERS Depression  Neuromuscular disease    GI/Hepatic   Endo/Other  diabetes, Type 2, Insulin DependentMorbid obesity  Renal/GU      Musculoskeletal  (+) Arthritis -,   Abdominal   Peds  Hematology   Anesthesia Other Findings   Reproductive/Obstetrics                             Anesthesia Physical Anesthesia Plan  ASA: III  Anesthesia Plan: MAC   Post-op Pain Management:    Induction: Intravenous  Airway Management Planned: Simple Face Mask  Additional Equipment:   Intra-op Plan:   Post-operative Plan:   Informed Consent: I have reviewed the patients History and Physical, chart, labs and discussed the procedure including the risks, benefits and alternatives for the proposed anesthesia with the patient or authorized representative who has indicated his/her understanding and acceptance.     Plan Discussed with:   Anesthesia Plan Comments:         Anesthesia Quick Evaluation

## 2015-01-12 NOTE — Anesthesia Procedure Notes (Signed)
Procedure Name: MAC Date/Time: 01/12/2015 10:23 AM Performed by: Andree Elk, AMY A Pre-anesthesia Checklist: Patient identified, Timeout performed, Emergency Drugs available, Suction available and Patient being monitored Oxygen Delivery Method: Simple face mask

## 2015-01-12 NOTE — Anesthesia Postprocedure Evaluation (Signed)
  Anesthesia Post-op Note  Patient: [name]  Procedure(s) Performed: Procedure(s): PARTIAL AMPUTATION 1ST RAY RIGHT FOOT (Amputation of right great toe and portion of the first metatarsal bone right foot) (Right)  Patient Location: PACU  Anesthesia Type:MAC  Level of Consciousness: awake, alert , oriented and patient cooperative  Airway and Oxygen Therapy: Patient Spontanous Breathing and Patient connected to nasal cannula oxygen  Post-op Pain: none  Post-op Assessment: Post-op Vital signs reviewed, Patient's Cardiovascular Status Stable, Respiratory Function Stable, Patent Airway, No signs of Nausea or vomiting and Pain level controlled  Post-op Vital Signs: Reviewed and stable  Last Vitals:  Filed Vitals:   01/12/15 1020  BP: 112/80  Resp: 16    Complications: No apparent anesthesia complications

## 2015-01-12 NOTE — Discharge Instructions (Signed)
These instructions will give you an idea of what to expect after surgery and how to manage issues that may arise before your first post op office visit.  Pain Management Pain is best managed by staying ahead of it. If pain gets out of control, it is difficult to get it back under control. Local anesthesia that lasts 6-8 hours is used to numb the foot and decrease pain.  For the best pain control, take the pain medication as prescribed for the first 2 days post op. On the third day pain medication can be taken as needed.   Post Op Nausea Nausea is common after surgery, so it is managed proactively.  If prescribed, use the prescribed nausea medication regularly for the first 2 days post op.  Bandages Do not worry if there is blood on the bandage. What looks like a lot of blood on the bandage is actually a small amount. Blood on the dressing spreads out as it is absorbed by the gauze, the same way a drop of water spreads out on a paper towel.  If the bandages feel wet or dry, stiff and uncomfortable, call the office during office hours and we will schedule a time for you to have the bandage changed.  Unless you are specifically told otherwise, we will do the first bandage change in the office.  Keep your bandage dry. If the bandage becomes wet or soiled, notify the office and we will schedule a time to change the bandage.  Activity It is best to spend most of the first 2 days after surgery lying down with the foot elevated above the level of your heart. You may put weight on your right heel while wearing the surgical shoe.   You may only get up to go to the restroom.  Driving Do not drive until you are able to respond in an emergency (i.e. slam on the brakes). This usually occurs after the bone has healed - 6 to 8 weeks.  Call the Office If you have a fever over 101F.  If you have increasing pain after the initial post op pain has settled down.  If you have increasing redness, swelling, or  drainage.  If you have any questions or concerns.  Monitored Anesthesia Care Monitored anesthesia care is an anesthesia service for a medical procedure. Anesthesia is the loss of the ability to feel pain. It is produced by medicines called anesthetics. It may affect a small area of your body (local anesthesia), a large area of your body (regional anesthesia), or your entire body (general anesthesia). The need for monitored anesthesia care depends your procedure, your condition, and the potential need for regional or general anesthesia. It is often provided during procedures where:  General anesthesia may be needed if there are complications. This is because you need special care when you are under general anesthesia.  You will be under local or regional anesthesia. This is so that you are able to have higher levels of anesthesia if needed.  You will receive calming medicines (sedatives). This is especially the case if sedatives are given to put you in a semi-conscious state of relaxation (deep sedation). This is because the amount of sedative needed to produce this state can be hard to predict. Too much of a sedative can produce general anesthesia. Monitored anesthesia care is performed by one or more health care providers who have special training in all types of anesthesia. You will need to meet with these health care providers before  your procedure. During this meeting, they will ask you about your medical history. They will also give you instructions to follow. (For example, you will need to stop eating and drinking before your procedure. You may also need to stop or change medicines you are taking.) During your procedure, your health care providers will stay with you. They will:  Watch your condition. This includes watching your blood pressure, breathing, and level of pain.  Diagnose and treat problems that occur.  Give medicines if they are needed. These may include calming medicines (sedatives)  and anesthetics.  Make sure you are comfortable.  Having monitored anesthesia care does not necessarily mean that you will be under anesthesia. It does mean that your health care providers will be able to manage anesthesia if you need it or if it occurs. It also means that you will be able to have a different type of anesthesia than you are having if you need it. When your procedure is complete, your health care providers will continue to watch your condition. They will make sure any medicines wear off before you are allowed to go home.  Document Released: 06/29/2005 Document Revised: 02/17/2014 Document Reviewed: 11/14/2012 [facility] Patient Information 2015 [place], [place]. This information is not intended to replace advice given to you by your health care provider. Make sure you discuss any questions you have with your health care provider.

## 2015-01-13 ENCOUNTER — Telehealth: Payer: Self-pay | Admitting: *Deleted

## 2015-01-13 ENCOUNTER — Other Ambulatory Visit: Payer: Self-pay | Admitting: *Deleted

## 2015-01-13 MED ORDER — OXYCODONE-ACETAMINOPHEN 10-325 MG PO TABS: 1.0000 | ORAL_TABLET | ORAL | Status: DC | PRN

## 2015-01-13 NOTE — Telephone Encounter (Signed)
Pt had surgery, hopefully go to regular dosing on April 2

## 2015-01-13 NOTE — Telephone Encounter (Signed)
Script for percocet ready for pickup. Percocet 10/325 #24 one q 4 hours. May fill today. May fill next rx for #150 on April 2nd.per Dr. Nicki Reaper.  Pt notified.

## 2015-01-14 ENCOUNTER — Encounter (HOSPITAL_COMMUNITY): Payer: Self-pay | Admitting: Podiatry

## 2015-01-15 LAB — WOUND CULTURE

## 2015-01-16 ENCOUNTER — Other Ambulatory Visit: Payer: Self-pay | Admitting: Family Medicine

## 2015-01-17 LAB — ANAEROBIC CULTURE

## 2015-01-29 ENCOUNTER — Other Ambulatory Visit: Payer: Self-pay | Admitting: *Deleted

## 2015-01-29 DIAGNOSIS — L97512 Non-pressure chronic ulcer of other part of right foot with fat layer exposed: Secondary | ICD-10-CM

## 2015-02-04 ENCOUNTER — Encounter: Payer: BLUE CROSS/BLUE SHIELD | Admitting: Nutrition

## 2015-02-09 ENCOUNTER — Other Ambulatory Visit: Payer: Self-pay

## 2015-02-09 DIAGNOSIS — T8189XA Other complications of procedures, not elsewhere classified, initial encounter: Secondary | ICD-10-CM

## 2015-02-10 ENCOUNTER — Other Ambulatory Visit: Payer: Self-pay | Admitting: *Deleted

## 2015-02-10 MED ORDER — INSULIN GLARGINE 100 UNIT/ML SOLOSTAR PEN: PEN_INJECTOR | SUBCUTANEOUS | Status: DC

## 2015-02-12 ENCOUNTER — Ambulatory Visit (INDEPENDENT_AMBULATORY_CARE_PROVIDER_SITE_OTHER): Payer: BLUE CROSS/BLUE SHIELD | Admitting: Family Medicine

## 2015-02-12 ENCOUNTER — Encounter: Payer: Self-pay | Admitting: Family Medicine

## 2015-02-12 VITALS — BP 122/80 | Ht 75.0 in | Wt 266.0 lb

## 2015-02-12 DIAGNOSIS — I739 Peripheral vascular disease, unspecified: Secondary | ICD-10-CM

## 2015-02-12 DIAGNOSIS — Z79899 Other long term (current) drug therapy: Secondary | ICD-10-CM

## 2015-02-12 DIAGNOSIS — E119 Type 2 diabetes mellitus without complications: Secondary | ICD-10-CM

## 2015-02-12 DIAGNOSIS — M170 Bilateral primary osteoarthritis of knee: Secondary | ICD-10-CM

## 2015-02-12 DIAGNOSIS — L98499 Non-pressure chronic ulcer of skin of other sites with unspecified severity: Secondary | ICD-10-CM

## 2015-02-12 DIAGNOSIS — G894 Chronic pain syndrome: Secondary | ICD-10-CM

## 2015-02-12 DIAGNOSIS — I70209 Unspecified atherosclerosis of native arteries of extremities, unspecified extremity: Secondary | ICD-10-CM

## 2015-02-12 DIAGNOSIS — Z13220 Encounter for screening for lipoid disorders: Secondary | ICD-10-CM

## 2015-02-12 LAB — POCT GLYCOSYLATED HEMOGLOBIN (HGB A1C): Hemoglobin A1C: 7.3

## 2015-02-12 MED ORDER — OXYCODONE-ACETAMINOPHEN 10-325 MG PO TABS: 1.0000 | ORAL_TABLET | ORAL | Status: DC | PRN

## 2015-02-12 MED ORDER — EMPAGLIFLOZIN 25 MG PO TABS: 25.0000 mg | ORAL_TABLET | Freq: Every day | ORAL | Status: DC

## 2015-02-12 NOTE — Progress Notes (Signed)
   Subjective:    Patient ID: [name], male    DOB: 01-14-1956, 59 y.o.   MRN: [id number]  Diabetes He presents for his follow-up diabetic visit. He has type 2 diabetes mellitus. His disease course has been stable. There are no hypoglycemic associated symptoms. Pertinent negatives for hypoglycemia include no confusion. There are no diabetic associated symptoms. Pertinent negatives for diabetes include no chest pain, no fatigue, no polydipsia, no polyphagia and no weakness. There are no hypoglycemic complications. Symptoms are stable. There are no diabetic complications. There are no known risk factors for coronary artery disease. Current diabetic treatment includes insulin injections. He is compliant with treatment all of the time.   Patient wants to discuss the benefits of taking Novolog for his high blood sugar readings.    Review of Systems  Constitutional: Negative for activity change, appetite change and fatigue.  HENT: Negative for congestion.   Respiratory: Negative for cough.   Cardiovascular: Negative for chest pain.  Gastrointestinal: Negative for abdominal pain.  Endocrine: Negative for polydipsia and polyphagia.  Neurological: Negative for weakness.  Psychiatric/Behavioral: Negative for confusion.       Objective:   Physical Exam  Constitutional: He appears well-nourished. No distress.  Cardiovascular: Normal rate, regular rhythm and normal heart sounds.   No murmur heard. Pulmonary/Chest: Effort normal and breath sounds normal. No respiratory distress.  Musculoskeletal: He exhibits no edema.  Lymphadenopathy:    He has no cervical adenopathy.  Neurological: He is alert.  Psychiatric: His behavior is normal.  Vitals reviewed.   Greater than 25 minutes spent with this patient covering all these multiple issues plus discussing stress levels      Assessment & Plan:  Diabetes actually improved for the first time in a long time increase long-acting insulin until  fasting sugars are in the 90-120 range on regular basis  Change dose of his Jardiance to 25 mg  Follow-up again in 3 months time  Chronic pain prescriptions given he has enough to get through until the next visit  Patient is recovering from having part of his remove due to diabetes  I believe this patient is disabled I do not feel there is any job he can do because of chronic back pain and knee pain diabetes diabetic neuropathy hypertension and peripheral vascular disease.

## 2015-02-13 ENCOUNTER — Encounter: Payer: Self-pay | Admitting: Family Medicine

## 2015-02-18 ENCOUNTER — Encounter: Payer: Self-pay | Admitting: Vascular Surgery

## 2015-02-19 ENCOUNTER — Other Ambulatory Visit: Payer: Self-pay

## 2015-02-19 ENCOUNTER — Ambulatory Visit (INDEPENDENT_AMBULATORY_CARE_PROVIDER_SITE_OTHER): Payer: BLUE CROSS/BLUE SHIELD | Admitting: Vascular Surgery

## 2015-02-19 ENCOUNTER — Other Ambulatory Visit: Payer: Self-pay | Admitting: Vascular Surgery

## 2015-02-19 ENCOUNTER — Ambulatory Visit (HOSPITAL_COMMUNITY)
Admission: RE | Admit: 2015-02-19 | Discharge: 2015-02-19 | Disposition: A | Discharge status: Home / Self Care | Payer: BLUE CROSS/BLUE SHIELD | Source: Ambulatory Visit | Attending: Vascular Surgery | Admitting: Vascular Surgery

## 2015-02-19 ENCOUNTER — Encounter: Payer: Self-pay | Admitting: Vascular Surgery

## 2015-02-19 VITALS — BP 140/86 | HR 83 | Ht 75.0 in | Wt 265.0 lb

## 2015-02-19 DIAGNOSIS — L97512 Non-pressure chronic ulcer of other part of right foot with fat layer exposed: Secondary | ICD-10-CM

## 2015-02-19 DIAGNOSIS — I739 Peripheral vascular disease, unspecified: Secondary | ICD-10-CM | POA: Diagnosis not present

## 2015-02-19 NOTE — Progress Notes (Signed)
[facility] [place] HISTORY AND PHYSICAL    History of Present Illness:  Patient is a 59 y.o. year old male who presents for evaluation of a poorly healing right first toe amputation. The patient has a several year history of diabetes. He also has numbness and tingling in his right foot from several prior back operations. He also has numbness and tingling in his feet secondary to diabetes. He denies claudication symptoms. He is a former smoker but quit 30 years ago. He does have a history of coronary artery disease and had angioplasty in 2006. He currently does not have chest pain. He previously developed a blister on his right first toe approximately 2 years ago. This healed spontaneously. He recently developed a very painful callus on his right foot. This was shaved off by podiatry.  Most recently he had amputation of the right first toe on 01/03/2015.  This was done for infection.   Other medical problems include hypertension, elevated cholesterol, obesity, arthritis all of which are currently stable.    Past Medical History   Diagnosis  Date   .  Diabetes mellitus     .  HTN (hypertension)     .  CAD (coronary artery disease)         status post PCI and bare metal stenting of   the proximal right coronary artery with placement of a Driver bare   metal stent.    .  Myocardial infarction     .  Hypercholesterolemia     .  Obesity     .  Hyperlipidemia     .  DJD (degenerative joint disease)     .  First degree AV block         with intermittent second degree types I and    II heart block.    .  Oral candidiasis     .  Depression     .  Heart murmur  1974   .  Heart attack  2008   .  Gout     .  Celiac disease         Past Surgical History  Procedure Laterality Date  . Right ankle fracture open reduction internal fixation.  2003  . Laminotomy  2001, 2006      Right L4-5 interlaminar laminotomy with excision of herniated   disc with operating microscope.  . Neck  surgery      cervical disc  . Esophagogastroduodenoscopy  08/2008  . Colonoscopy  08/2008  . Orif wrist fracture Right   . Wound debridement Right 12/24/2014    Procedure: DEBRIDEMENT SOFT TISSUE AND BONE RIGHT FOOT;  Surgeon: Caprice Beaver, DPM;  Location: [facility];  Service: Podiatry;  Laterality: Right;  . Amputation Right 01/12/2015    Procedure: PARTIAL AMPUTATION 1ST RAY RIGHT FOOT (Amputation of right great toe and portion of the first metatarsal bone right foot);  Surgeon: Caprice Beaver, DPM;  Location: [facility];  Service: Podiatry;  Laterality: Right;    Social History History   Substance Use Topics   .  Smoking status:  Former Research scientist (life sciences)   .  Smokeless tobacco:  Not on file         Comment: quit in 1979   .  Alcohol Use:  No     Family History Family History   Problem  Relation  Age of Onset   .  Cancer  Mother     .  Cancer  Father     .  Cancer  Sister       Allergies    Allergies   Allergen  Reactions   .  Vicodin [Hydrocodone-Acetaminophen]  Nausea Only   .  Statins         Legs ache    Current Outpatient Prescriptions on File Prior to Visit  Medication Sig Dispense Refill  . aspirin EC 81 MG tablet Take 81 mg by mouth daily.    . empagliflozin (JARDIANCE) 25 MG TABS tablet Take 25 mg by mouth daily. 30 tablet 12  . halobetasol (ULTRAVATE) 0.05 % ointment Apply topically 2 (two) times daily. 1 Tube 5  . Insulin Glargine (LANTUS SOLOSTAR) 100 UNIT/ML Solostar Pen MAY TITRATE UP TO 18 UNITS SUBCUTANEOUSLY AT BEDTIME 5 pen 0  . oxyCODONE-acetaminophen (PERCOCET) 10-325 MG per tablet Take 1 tablet by mouth every 4 (four) hours as needed for pain. As needed for pain 150 tablet 0  . rosuvastatin (CRESTOR) 5 MG tablet Take 1 tablet (5 mg total) by mouth at bedtime. 30 tablet 0   No current facility-administered medications on file prior to visit.   .  ROS:    General:  No weight loss, Fever, chills  HEENT: No recent headaches, no nasal bleeding, no visual  changes, no sore throat  Neurologic: + dizziness, blackouts, seizures. No recent symptoms of stroke or mini- stroke. No recent episodes of slurred speech, or temporary blindness.  Cardiac: No recent episodes of chest pain/pressure, no shortness of breath at rest.  No shortness of breath with exertion.  Denies history of atrial fibrillation or irregular heartbeat  Vascular: No history of rest pain in feet.  No history of claudication.  + history of non-healing ulcer, No history of DVT    Pulmonary: No home oxygen, no productive cough, no hemoptysis,  No asthma or wheezing  Musculoskeletal:  [x ] Arthritis, [ ]  Low back pain,  [ ]  Joint pain  Hematologic:No history of hypercoagulable state.  No history of easy bleeding.  No history of anemia  Gastrointestinal: No hematochezia or melena,  No gastroesophageal reflux, no trouble swallowing  Urinary: [ ]  chronic Kidney disease, [ ]  on HD - [ ]  MWF or [ ]  TTHS, [ ]  Burning with urination, [ ]  Frequent urination, [ ]  Difficulty urinating;    Skin: No rashes  Psychological: No history of anxiety,  No history of depression   Physical Examination    Filed Vitals:   02/19/15 0953  BP: 140/86  Pulse: 83  Height: 6\' 3"  (1.905 m)  Weight: 265 lb (120.203 kg)  SpO2: 100%   General:  Alert and oriented, no acute distress HEENT: Normal Neck: No bruit or JVD Pulmonary: Clear to auscultation bilaterally Cardiac: Regular Rate and Rhythm without murmur Abdomen: Soft, non-tender, non-distended, no mass Skin: No rash, healing amputation right first toe with no drainage expressible no fluctuance slight separation of the distal end of the incision with what appears to be healthy granulation tissue below this Extremity Pulses:  2+ radial, brachial, femoral, absent dorsalis pedis pulses bilaterally, absent posterior tibial pulses bilaterally Musculoskeletal: No deformity or edema     Neurologic: Upper and lower extremity motor 5/5 and symmetric  DATA:   Patient had bilateral ABIs today which were 1.27 on the right 1.32 on the left. Waveforms were biphasic on the right side biphasic to triphasic on the left side, was a suggestion of artificial inflation secondary to calcification.  ASSESSMENT:  Diabetic neuropathy with foot changes consistent with this. He may have  calcified tibial arterial occlusive disease that ABIs are underestimating. Since the wound has been slow to heal I believe the best option would be further examination with an arteriogram.  PLAN:  Aortogram bilateral lower extremity runoff possible intervention scheduled for 02/27/2015. Risks benefits possible complications and procedure details including but not limited to bleeding infection vessel injury contrast reaction were explained the patient and his wife today. He understands and agrees to proceed.  Ruta Hinds, MD [facility] [place] Office: [phone] Pager: [phone]

## 2015-02-25 ENCOUNTER — Encounter: Payer: Self-pay | Admitting: Family Medicine

## 2015-02-27 ENCOUNTER — Encounter (HOSPITAL_COMMUNITY)
Admission: RE | Disposition: A | Discharge status: Home / Self Care | Payer: BLUE CROSS/BLUE SHIELD | Source: Ambulatory Visit | Attending: Vascular Surgery

## 2015-02-27 ENCOUNTER — Ambulatory Visit (HOSPITAL_COMMUNITY)
Admission: RE | Admit: 2015-02-27 | Discharge: 2015-02-27 | Disposition: A | Discharge status: Home / Self Care | Payer: BLUE CROSS/BLUE SHIELD | Source: Ambulatory Visit | Attending: Vascular Surgery | Admitting: Vascular Surgery

## 2015-02-27 ENCOUNTER — Encounter (HOSPITAL_COMMUNITY): Payer: Self-pay | Admitting: Vascular Surgery

## 2015-02-27 ENCOUNTER — Other Ambulatory Visit: Payer: Self-pay

## 2015-02-27 DIAGNOSIS — I2510 Atherosclerotic heart disease of native coronary artery without angina pectoris: Secondary | ICD-10-CM | POA: Insufficient documentation

## 2015-02-27 DIAGNOSIS — E785 Hyperlipidemia, unspecified: Secondary | ICD-10-CM | POA: Insufficient documentation

## 2015-02-27 DIAGNOSIS — Z9861 Coronary angioplasty status: Secondary | ICD-10-CM | POA: Insufficient documentation

## 2015-02-27 DIAGNOSIS — I252 Old myocardial infarction: Secondary | ICD-10-CM | POA: Diagnosis not present

## 2015-02-27 DIAGNOSIS — E780 Pure hypercholesterolemia: Secondary | ICD-10-CM | POA: Insufficient documentation

## 2015-02-27 DIAGNOSIS — Z87891 Personal history of nicotine dependence: Secondary | ICD-10-CM | POA: Insufficient documentation

## 2015-02-27 DIAGNOSIS — I10 Essential (primary) hypertension: Secondary | ICD-10-CM | POA: Insufficient documentation

## 2015-02-27 DIAGNOSIS — I70245 Atherosclerosis of native arteries of left leg with ulceration of other part of foot: Secondary | ICD-10-CM | POA: Diagnosis not present

## 2015-02-27 DIAGNOSIS — E1140 Type 2 diabetes mellitus with diabetic neuropathy, unspecified: Secondary | ICD-10-CM | POA: Insufficient documentation

## 2015-02-27 DIAGNOSIS — I739 Peripheral vascular disease, unspecified: Secondary | ICD-10-CM

## 2015-02-27 DIAGNOSIS — Z48812 Encounter for surgical aftercare following surgery on the circulatory system: Secondary | ICD-10-CM

## 2015-02-27 DIAGNOSIS — E11621 Type 2 diabetes mellitus with foot ulcer: Secondary | ICD-10-CM | POA: Insufficient documentation

## 2015-02-27 HISTORY — PX: PERIPHERAL VASCULAR CATHETERIZATION: SHX172C

## 2015-02-27 LAB — POCT I-STAT, CHEM 8
BUN: 19 mg/dL (ref 6–20)
CHLORIDE: 103 mmol/L (ref 101–111)
CREATININE: 0.8 mg/dL (ref 0.61–1.24)
Calcium, Ion: 1.15 mmol/L (ref 1.13–1.30)
Glucose, Bld: 145 mg/dL — ABNORMAL HIGH (ref 65–99)
HEMATOCRIT: 40 % (ref 39.0–52.0)
HEMOGLOBIN: 13.6 g/dL (ref 13.0–17.0)
POTASSIUM: 3.9 mmol/L (ref 3.5–5.1)
Sodium: 141 mmol/L (ref 135–145)
TCO2: 22 mmol/L (ref 0–100)

## 2015-02-27 LAB — GLUCOSE, CAPILLARY: Glucose-Capillary: 140 mg/dL — ABNORMAL HIGH (ref 65–99)

## 2015-02-27 SURGERY — ABDOMINAL AORTOGRAM
Anesthesia: LOCAL

## 2015-02-27 MED ORDER — ONDANSETRON HCL 4 MG/2ML IJ SOLN: 4.0000 mg | Freq: Four times a day (QID) | INTRAMUSCULAR | Status: DC | PRN

## 2015-02-27 MED ORDER — SODIUM CHLORIDE 0.9 % IV SOLN
INTRAVENOUS | Status: DC
  Administered 2015-02-27: 06:00:00 via INTRAVENOUS

## 2015-02-27 MED ORDER — FENTANYL CITRATE (PF) 100 MCG/2ML IJ SOLN
INTRAMUSCULAR | Status: DC | PRN
  Administered 2015-02-27: 25 ug via INTRAVENOUS

## 2015-02-27 MED ORDER — LIDOCAINE HCL (PF) 1 % IJ SOLN
INTRAMUSCULAR | Status: AC
  Filled 2015-02-27: qty 30

## 2015-02-27 MED ORDER — IODIXANOL 320 MG/ML IV SOLN
INTRAVENOUS | Status: DC | PRN
  Administered 2015-02-27: 180 mL via INTRA_ARTERIAL

## 2015-02-27 MED ORDER — PANTOPRAZOLE SODIUM 40 MG PO TBEC: 40.0000 mg | DELAYED_RELEASE_TABLET | Freq: Every day | ORAL | Status: DC

## 2015-02-27 MED ORDER — ACETAMINOPHEN 325 MG RE SUPP
325.0000 mg | RECTAL | Status: DC | PRN
  Filled 2015-02-27: qty 2

## 2015-02-27 MED ORDER — HEPARIN (PORCINE) IN NACL 2-0.9 UNIT/ML-% IJ SOLN
INTRAMUSCULAR | Status: AC
  Filled 2015-02-27: qty 1000

## 2015-02-27 MED ORDER — MIDAZOLAM HCL 2 MG/2ML IJ SOLN
INTRAMUSCULAR | Status: AC
  Filled 2015-02-27: qty 2

## 2015-02-27 MED ORDER — MIDAZOLAM HCL 2 MG/2ML IJ SOLN
INTRAMUSCULAR | Status: DC | PRN
  Administered 2015-02-27: 1 mg via INTRAVENOUS

## 2015-02-27 MED ORDER — DOCUSATE SODIUM 100 MG PO CAPS: 100.0000 mg | ORAL_CAPSULE | Freq: Every day | ORAL | Status: DC

## 2015-02-27 MED ORDER — OXYCODONE HCL 5 MG PO TABS: 5.0000 mg | ORAL_TABLET | ORAL | Status: DC | PRN

## 2015-02-27 MED ORDER — ALUM & MAG HYDROXIDE-SIMETH 200-200-20 MG/5ML PO SUSP
15.0000 mL | ORAL | Status: DC | PRN
  Filled 2015-02-27: qty 30

## 2015-02-27 MED ORDER — MORPHINE SULFATE 10 MG/ML IJ SOLN: 2.0000 mg | INTRAMUSCULAR | Status: DC | PRN

## 2015-02-27 MED ORDER — METOPROLOL TARTRATE 1 MG/ML IV SOLN
2.0000 mg | INTRAVENOUS | Status: DC | PRN
Start: 2015-02-27 — End: 2015-02-27

## 2015-02-27 MED ORDER — FENTANYL CITRATE (PF) 100 MCG/2ML IJ SOLN
INTRAMUSCULAR | Status: AC
  Filled 2015-02-27: qty 2

## 2015-02-27 MED ORDER — HYDRALAZINE HCL 20 MG/ML IJ SOLN: 5.0000 mg | INTRAMUSCULAR | Status: DC | PRN

## 2015-02-27 MED ORDER — SODIUM CHLORIDE 0.45 % IV SOLN
INTRAVENOUS | Status: DC
  Administered 2015-02-27: 09:00:00 via INTRAVENOUS

## 2015-02-27 MED ORDER — ACETAMINOPHEN 325 MG PO TABS
325.0000 mg | ORAL_TABLET | ORAL | Status: DC | PRN
Start: 2015-02-27 — End: 2015-02-27
  Filled 2015-02-27: qty 2

## 2015-02-27 MED ORDER — LABETALOL HCL 5 MG/ML IV SOLN: 10.0000 mg | INTRAVENOUS | Status: DC | PRN

## 2015-02-27 SURGICAL SUPPLY — 10 items
CATH CROSS OVER TEMPO 5F (CATHETERS) ×1 IMPLANT
CATH STRAIGHT 5FR 65CM (CATHETERS) ×1 IMPLANT
COVER PRB 48X5XTLSCP FOLD TPE (BAG) IMPLANT
COVER PROBE 5X48 (BAG) ×3
KIT PV (KITS) ×3 IMPLANT
SHEATH PINNACLE 5F 10CM (SHEATH) ×1 IMPLANT
SYR MEDRAD MARK V 150ML (SYRINGE) ×1 IMPLANT
TRANSDUCER W/STOPCOCK (MISCELLANEOUS) ×3 IMPLANT
TRAY PV CATH (CUSTOM PROCEDURE TRAY) ×3 IMPLANT
WIRE HITORQ VERSACORE ST 145CM (WIRE) ×1 IMPLANT

## 2015-02-27 NOTE — H&P (View-Only) (Signed)
[facility] [place] HISTORY AND PHYSICAL    History of Present Illness:  Patient is a 59 y.o. year old male who presents for evaluation of a poorly healing right first toe amputation. The patient has a several year history of diabetes. He also has numbness and tingling in his right foot from several prior back operations. He also has numbness and tingling in his feet secondary to diabetes. He denies claudication symptoms. He is a former smoker but quit 30 years ago. He does have a history of coronary artery disease and had angioplasty in 2006. He currently does not have chest pain. He previously developed a blister on his right first toe approximately 2 years ago. This healed spontaneously. He recently developed a very painful callus on his right foot. This was shaved off by podiatry.  Most recently he had amputation of the right first toe on 01/03/2015.  This was done for infection.   Other medical problems include hypertension, elevated cholesterol, obesity, arthritis all of which are currently stable.    Past Medical History   Diagnosis  Date   .  Diabetes mellitus     .  HTN (hypertension)     .  CAD (coronary artery disease)         status post PCI and bare metal stenting of   the proximal right coronary artery with placement of a Driver bare   metal stent.    .  Myocardial infarction     .  Hypercholesterolemia     .  Obesity     .  Hyperlipidemia     .  DJD (degenerative joint disease)     .  First degree AV block         with intermittent second degree types I and    II heart block.    .  Oral candidiasis     .  Depression     .  Heart murmur  1974   .  Heart attack  2008   .  Gout     .  Celiac disease         Past Surgical History  Procedure Laterality Date  . Right ankle fracture open reduction internal fixation.  2003  . Laminotomy  2001, 2006      Right L4-5 interlaminar laminotomy with excision of herniated   disc with operating microscope.  . Neck  surgery      cervical disc  . Esophagogastroduodenoscopy  08/2008  . Colonoscopy  08/2008  . Orif wrist fracture Right   . Wound debridement Right 12/24/2014    Procedure: DEBRIDEMENT SOFT TISSUE AND BONE RIGHT FOOT;  Surgeon: Caprice Beaver, DPM;  Location: [facility];  Service: Podiatry;  Laterality: Right;  . Amputation Right 01/12/2015    Procedure: PARTIAL AMPUTATION 1ST RAY RIGHT FOOT (Amputation of right great toe and portion of the first metatarsal bone right foot);  Surgeon: Caprice Beaver, DPM;  Location: [facility];  Service: Podiatry;  Laterality: Right;    Social History History   Substance Use Topics   .  Smoking status:  Former Research scientist (life sciences)   .  Smokeless tobacco:  Not on file         Comment: quit in 1979   .  Alcohol Use:  No     Family History Family History   Problem  Relation  Age of Onset   .  Cancer  Mother     .  Cancer  Father     .  Cancer  Sister       Allergies    Allergies   Allergen  Reactions   .  Vicodin [Hydrocodone-Acetaminophen]  Nausea Only   .  Statins         Legs ache    Current Outpatient Prescriptions on File Prior to Visit  Medication Sig Dispense Refill  . aspirin EC 81 MG tablet Take 81 mg by mouth daily.    . empagliflozin (JARDIANCE) 25 MG TABS tablet Take 25 mg by mouth daily. 30 tablet 12  . halobetasol (ULTRAVATE) 0.05 % ointment Apply topically 2 (two) times daily. 1 Tube 5  . Insulin Glargine (LANTUS SOLOSTAR) 100 UNIT/ML Solostar Pen MAY TITRATE UP TO 18 UNITS SUBCUTANEOUSLY AT BEDTIME 5 pen 0  . oxyCODONE-acetaminophen (PERCOCET) 10-325 MG per tablet Take 1 tablet by mouth every 4 (four) hours as needed for pain. As needed for pain 150 tablet 0  . rosuvastatin (CRESTOR) 5 MG tablet Take 1 tablet (5 mg total) by mouth at bedtime. 30 tablet 0   No current facility-administered medications on file prior to visit.   .  ROS:    General:  No weight loss, Fever, chills  HEENT: No recent headaches, no nasal bleeding, no visual  changes, no sore throat  Neurologic: + dizziness, blackouts, seizures. No recent symptoms of stroke or mini- stroke. No recent episodes of slurred speech, or temporary blindness.  Cardiac: No recent episodes of chest pain/pressure, no shortness of breath at rest.  No shortness of breath with exertion.  Denies history of atrial fibrillation or irregular heartbeat  Vascular: No history of rest pain in feet.  No history of claudication.  + history of non-healing ulcer, No history of DVT    Pulmonary: No home oxygen, no productive cough, no hemoptysis,  No asthma or wheezing  Musculoskeletal:  [x ] Arthritis, [ ]  Low back pain,  [ ]  Joint pain  Hematologic:No history of hypercoagulable state.  No history of easy bleeding.  No history of anemia  Gastrointestinal: No hematochezia or melena,  No gastroesophageal reflux, no trouble swallowing  Urinary: [ ]  chronic Kidney disease, [ ]  on HD - [ ]  MWF or [ ]  TTHS, [ ]  Burning with urination, [ ]  Frequent urination, [ ]  Difficulty urinating;    Skin: No rashes  Psychological: No history of anxiety,  No history of depression   Physical Examination    Filed Vitals:   02/19/15 0953  BP: 140/86  Pulse: 83  Height: 6\' 3"  (1.905 m)  Weight: 265 lb (120.203 kg)  SpO2: 100%   General:  Alert and oriented, no acute distress HEENT: Normal Neck: No bruit or JVD Pulmonary: Clear to auscultation bilaterally Cardiac: Regular Rate and Rhythm without murmur Abdomen: Soft, non-tender, non-distended, no mass Skin: No rash, healing amputation right first toe with no drainage expressible no fluctuance slight separation of the distal end of the incision with what appears to be healthy granulation tissue below this Extremity Pulses:  2+ radial, brachial, femoral, absent dorsalis pedis pulses bilaterally, absent posterior tibial pulses bilaterally Musculoskeletal: No deformity or edema     Neurologic: Upper and lower extremity motor 5/5 and symmetric  DATA:   Patient had bilateral ABIs today which were 1.27 on the right 1.32 on the left. Waveforms were biphasic on the right side biphasic to triphasic on the left side, was a suggestion of artificial inflation secondary to calcification.  ASSESSMENT:  Diabetic neuropathy with foot changes consistent with this. He may have  calcified tibial arterial occlusive disease that ABIs are underestimating. Since the wound has been slow to heal I believe the best option would be further examination with an arteriogram.  PLAN:  Aortogram bilateral lower extremity runoff possible intervention scheduled for 02/27/2015. Risks benefits possible complications and procedure details including but not limited to bleeding infection vessel injury contrast reaction were explained the patient and his wife today. He understands and agrees to proceed.  Ruta Hinds, MD [facility] [place] Office: [phone] Pager: [phone]

## 2015-02-27 NOTE — Interval H&P Note (Signed)
History and Physical Interval Note:  02/27/2015 7:40 AM  [name]  has presented today for surgery, with the diagnosis of diabetic neuropathy and slow to heal right toe amputation  The various methods of treatment have been discussed with the patient and family. After consideration of risks, benefits and other options for treatment, the patient has consented to  Procedure(s): Abdominal Aortogram (N/A) as a surgical intervention .  The patient's history has been reviewed, patient examined, no change in status, stable for surgery.  I have reviewed the patient's chart and labs.  Questions were answered to the patient's satisfaction.     Ruta Hinds

## 2015-02-27 NOTE — Discharge Instructions (Signed)
Arteriogram °Care After °These instructions give you information on caring for yourself after your procedure. Your doctor may also give you more specific instructions. Call your doctor if you have any problems or questions after your procedure. °HOME CARE °· Do not bathe, swim, or use a hot tub until directed by your doctor. You can shower. °· Do not lift anything heavier than 10 pounds (about a gallon of milk) for 2 days. °· Do not walk a lot, run, or drive for 2 days. °· Return to normal activities in 2 days or as told by your doctor. °Finding out the results of your test °Ask when your test results will be ready. Make sure you get your test results. °GET HELP RIGHT AWAY IF:  °· You have fever. °· You have more pain in your leg. °· The leg that was cut is: °¨ Bleeding. °¨ Puffy (swollen) or red. °¨ Cold. °¨ Pale or changes color. °¨ Weak. °¨ Tingly or numb. °If you go to the Emergency Room, tell your nurse that you have had an arteriogram. Take this paper with you to show the nurse. °MAKE SURE YOU: °· Understand these instructions. °· Will watch your condition. °· Will get help right away if you are not doing well or get worse. °Document Released: 12/30/2008 Document Revised: 10/08/2013 Document Reviewed: 12/30/2008 °ExitCare® Patient Information ©2015 [employer]. This information is not intended to replace advice given to you by your health care provider. Make sure you discuss any questions you have with your health care provider. ° °

## 2015-03-02 MED FILL — Heparin Sodium (Porcine) 2 Unit/ML in Sodium Chloride 0.9%: INTRAMUSCULAR | Qty: 1000 | Status: AC

## 2015-03-02 MED FILL — Lidocaine HCl Local Preservative Free (PF) Inj 1%: INTRAMUSCULAR | Qty: 30 | Status: AC

## 2015-03-04 ENCOUNTER — Telehealth: Payer: Self-pay | Admitting: Vascular Surgery

## 2015-03-04 NOTE — Telephone Encounter (Signed)
-----   Message from Denman George, RN sent at 02/27/2015  2:47 PM EDT ----- Regarding: Zigmund Daniel log; also needs 6 mo f/u with ABI's and toe pressures and appt. w/ NP   ----- Message -----    From: Elam Dutch, MD    Sent: 02/27/2015  10:05 AM      To: [facility] Charge Pool  Aortogram with bilat runoff [place] groin  He can follow up with Vinnie Level in 6 mo with bilateral ABI and toe pressures  Ruta Hinds

## 2015-03-04 NOTE — Telephone Encounter (Signed)
Mailed letter, lm for pt, dpm

## 2015-03-17 DIAGNOSIS — Z029 Encounter for administrative examinations, unspecified: Secondary | ICD-10-CM

## 2015-03-19 ENCOUNTER — Ambulatory Visit: Payer: BLUE CROSS/BLUE SHIELD | Admitting: Family Medicine

## 2015-05-07 ENCOUNTER — Telehealth: Payer: Self-pay | Admitting: Nutrition

## 2015-05-07 ENCOUNTER — Telehealth: Payer: Self-pay | Admitting: Family Medicine

## 2015-05-07 MED ORDER — OXYCODONE-ACETAMINOPHEN 10-325 MG PO TABS: 1.0000 | ORAL_TABLET | ORAL | Status: DC | PRN

## 2015-05-07 NOTE — Telephone Encounter (Signed)
VM to call and reschedule missed appointment. PC

## 2015-05-07 NOTE — Telephone Encounter (Signed)
He may have a prescription. Very important for the patient to keep follow-up in August, please connect with his pharmacy to find out the exact date he can get his next prescription and make it or that date thank you

## 2015-05-07 NOTE — Telephone Encounter (Signed)
oxyCODONE-acetaminophen (PERCOCET) 10-325 MG per tablet  Pt states he will need a refill on this med, he states he did not get a letter Saying he needed to reschedule. He will run out around the 2 or 3rd of the  Month.

## 2015-05-07 NOTE — Telephone Encounter (Signed)
Spoke with patient and informed him that his rx is ready for pickup and can be filled on 05/19/15. Called pharmacy and verified that last prescription was filled on 04/17/15. Patient stated that he has an upcoming appointment on 05/29/15. Patient verbalized understanding.

## 2015-05-13 ENCOUNTER — Ambulatory Visit: Payer: BLUE CROSS/BLUE SHIELD | Admitting: Family Medicine

## 2015-05-13 ENCOUNTER — Ambulatory Visit: Payer: BLUE CROSS/BLUE SHIELD | Admitting: Nutrition

## 2015-05-29 ENCOUNTER — Encounter: Payer: Self-pay | Admitting: Family Medicine

## 2015-05-29 ENCOUNTER — Ambulatory Visit (INDEPENDENT_AMBULATORY_CARE_PROVIDER_SITE_OTHER): Payer: BLUE CROSS/BLUE SHIELD | Admitting: Family Medicine

## 2015-05-29 VITALS — BP 138/86 | Ht 75.0 in | Wt 267.0 lb

## 2015-05-29 DIAGNOSIS — E785 Hyperlipidemia, unspecified: Secondary | ICD-10-CM | POA: Diagnosis not present

## 2015-05-29 DIAGNOSIS — Z125 Encounter for screening for malignant neoplasm of prostate: Secondary | ICD-10-CM | POA: Diagnosis not present

## 2015-05-29 DIAGNOSIS — E119 Type 2 diabetes mellitus without complications: Secondary | ICD-10-CM

## 2015-05-29 DIAGNOSIS — Z79899 Other long term (current) drug therapy: Secondary | ICD-10-CM

## 2015-05-29 DIAGNOSIS — M170 Bilateral primary osteoarthritis of knee: Secondary | ICD-10-CM

## 2015-05-29 LAB — POCT GLYCOSYLATED HEMOGLOBIN (HGB A1C): Hemoglobin A1C: 6.2

## 2015-05-29 MED ORDER — OXYCODONE-ACETAMINOPHEN 10-325 MG PO TABS: 1.0000 | ORAL_TABLET | ORAL | Status: DC | PRN

## 2015-05-29 NOTE — Progress Notes (Signed)
   Subjective:    Patient ID: [name], male    DOB: 1956-05-01, 59 y.o.   MRN: [id number]  Diabetes He presents for his follow-up diabetic visit. He has type 2 diabetes mellitus. Pertinent negatives for hypoglycemia include no confusion. Pertinent negatives for diabetes include no chest pain, no fatigue, no polydipsia, no polyphagia and no weakness. Risk factors for coronary artery disease include diabetes mellitus. Current diabetic treatment includes insulin injections. He is compliant with treatment all of the time. His weight is stable. He is following a diabetic diet.   Patient with c/o bilateral shoulder pain- right shoulder is the worse.  patient denies any chest pressure tightness pain he states compliance with his medication he relates compliance with his pain medicine denies side effects patient relates moods are doing good.  relates compliance with cholesterol medicine Review of Systems  Constitutional: Negative for activity change, appetite change and fatigue.  HENT: Negative for congestion.   Respiratory: Negative for cough.   Cardiovascular: Negative for chest pain.  Gastrointestinal: Negative for abdominal pain.  Endocrine: Negative for polydipsia and polyphagia.  Neurological: Negative for weakness.  Psychiatric/Behavioral: Negative for confusion.       Objective:   Physical Exam  Constitutional: He appears well-nourished. No distress.  Cardiovascular: Normal rate, regular rhythm and normal heart sounds.   No murmur heard. Pulmonary/Chest: Effort normal and breath sounds normal. No respiratory distress.  Musculoskeletal: He exhibits no edema.  Lymphadenopathy:    He has no cervical adenopathy.  Neurological: He is alert.  Psychiatric: His behavior is normal.  Vitals reviewed.   I believe the patient has some mild arthralgias in his shoulders    diabetic foot exam has neuropathy and poor circulation both the evidence of removal of the great toe on the right  side    Assessment & Plan:   gentle range of motion of the shoulders Diabetes good control   patient was counseled to get ophthalmology exam  chronic pain patient takes his medicine he was warned not to take pain medicine at bedtime  refills 3 prescriptions given  hyperlipidemia check lipid profile continue medication await the results  history of micro-proteinuria check ratio  prostate screening  greater than half in discussion of multiple health issues 25 minutes spent with patient

## 2015-06-08 ENCOUNTER — Other Ambulatory Visit: Payer: Self-pay | Admitting: Family Medicine

## 2015-06-11 ENCOUNTER — Other Ambulatory Visit: Payer: Self-pay | Admitting: Family Medicine

## 2015-06-15 ENCOUNTER — Telehealth: Payer: Self-pay | Admitting: Family Medicine

## 2015-06-15 MED ORDER — INSULIN GLARGINE 100 UNIT/ML SOLOSTAR PEN: 36.0000 [IU] | PEN_INJECTOR | Freq: Every day | SUBCUTANEOUS | Status: DC

## 2015-06-15 NOTE — Telephone Encounter (Signed)
Rx sent electronically to pharmacy. Patient notified. 

## 2015-06-15 NOTE — Telephone Encounter (Signed)
Pt is needing a refill on his lantis because he is taking 36 units instead of 18 units. Pharmacy won't refill until 9/14   [facility]

## 2015-06-16 LAB — MICROALBUMIN / CREATININE URINE RATIO
Creatinine, Urine: 72.6 mg/dL
MICROALB/CREAT RATIO: 25.5 mg/g creat (ref 0.0–30.0)
Microalbumin, Urine: 18.5 ug/mL

## 2015-06-16 LAB — BASIC METABOLIC PANEL
BUN / CREAT RATIO: 20 (ref 10–22)
BUN: 17 mg/dL (ref 8–27)
CO2: 23 mmol/L (ref 18–29)
CREATININE: 0.84 mg/dL (ref 0.76–1.27)
Calcium: 9.4 mg/dL (ref 8.6–10.2)
Chloride: 102 mmol/L (ref 97–108)
GFR calc non Af Amer: 95 mL/min/{1.73_m2} (ref >59)
GFR, EST AFRICAN AMERICAN: 110 mL/min/{1.73_m2} (ref >59)
Glucose: 133 mg/dL — ABNORMAL HIGH (ref 65–99)
Potassium: 4.6 mmol/L (ref 3.5–5.2)
SODIUM: 140 mmol/L (ref 134–144)

## 2015-06-16 LAB — LIPID PANEL
Chol/HDL Ratio: 4 ratio units (ref 0.0–5.0)
Cholesterol, Total: 131 mg/dL (ref 100–199)
HDL: 33 mg/dL — ABNORMAL LOW (ref >39)
LDL Calculated: 73 mg/dL (ref 0–99)
TRIGLYCERIDES: 126 mg/dL (ref 0–149)
VLDL Cholesterol Cal: 25 mg/dL (ref 5–40)

## 2015-06-16 LAB — HEPATIC FUNCTION PANEL
ALK PHOS: 68 IU/L (ref 39–117)
ALT: 14 IU/L (ref 0–44)
AST: 15 IU/L (ref 0–40)
Albumin: 4.4 g/dL (ref 3.6–4.8)
BILIRUBIN TOTAL: 0.5 mg/dL (ref 0.0–1.2)
BILIRUBIN, DIRECT: 0.14 mg/dL (ref 0.00–0.40)
Total Protein: 6.9 g/dL (ref 6.0–8.5)

## 2015-06-16 LAB — PSA: PROSTATE SPECIFIC AG, SERUM: 0.4 ng/mL (ref 0.0–4.0)

## 2015-06-19 ENCOUNTER — Ambulatory Visit: Payer: BLUE CROSS/BLUE SHIELD | Admitting: Nutrition

## 2015-06-30 ENCOUNTER — Telehealth: Payer: Self-pay | Admitting: Family Medicine

## 2015-06-30 NOTE — Telephone Encounter (Signed)
So I am not really certain what is being asked. Nurse's-please call talk with the patient. Find out specifically what is his issue and his concern. Please find out if he is tried anything else for his shoulders. Once I get additional information then I can let you know what needs to be done

## 2015-06-30 NOTE — Telephone Encounter (Signed)
Pt is calling to say that his shoulders on both sides are causing Him to wake at night. He sleeps about 3 hours with a pain pill,  He takes another pill in order to go to sleep. He says he is tired of that Part of it.    [facility]

## 2015-06-30 NOTE — Telephone Encounter (Signed)
[facility] 06/30/15

## 2015-07-01 NOTE — Telephone Encounter (Signed)
Patient stated he would like office visit to discuss. Patient transferred up front to schedule office visit.

## 2015-07-06 ENCOUNTER — Encounter: Payer: Self-pay | Admitting: Nutrition

## 2015-07-06 ENCOUNTER — Telehealth: Payer: Self-pay | Admitting: Nutrition

## 2015-07-06 NOTE — Telephone Encounter (Signed)
Talked to his wife. She would have him call for appt. PC

## 2015-07-07 ENCOUNTER — Encounter: Payer: Self-pay | Admitting: Family Medicine

## 2015-07-07 ENCOUNTER — Ambulatory Visit (INDEPENDENT_AMBULATORY_CARE_PROVIDER_SITE_OTHER): Payer: BLUE CROSS/BLUE SHIELD | Admitting: Family Medicine

## 2015-07-07 VITALS — BP 110/68 | Ht 75.0 in | Wt 271.2 lb

## 2015-07-07 DIAGNOSIS — M25511 Pain in right shoulder: Secondary | ICD-10-CM | POA: Diagnosis not present

## 2015-07-07 DIAGNOSIS — M25512 Pain in left shoulder: Secondary | ICD-10-CM | POA: Diagnosis not present

## 2015-07-07 MED ORDER — GABAPENTIN 100 MG PO CAPS: 100.0000 mg | ORAL_CAPSULE | Freq: Three times a day (TID) | ORAL | Status: DC

## 2015-07-07 NOTE — Progress Notes (Signed)
   Subjective:    Patient ID: [name], male    DOB: 06/16/1956, 59 y.o.   MRN: [id number]  Shoulder Pain  The pain is present in the right shoulder and left shoulder. This is a new problem. The current episode started more than 1 month ago. The problem occurs constantly. The problem has been unchanged. The quality of the pain is described as aching. The pain is moderate. The symptoms are aggravated by activity. He has tried oral narcotics for the symptoms. The treatment provided moderate relief.   Patient believes his cholesterol medication is causing this pain.     Review of Systems  Constitutional: Negative for activity change, appetite change and fatigue.  HENT: Negative for congestion.   Respiratory: Negative for cough.   Cardiovascular: Negative for chest pain.  Gastrointestinal: Negative for abdominal pain.  Endocrine: Negative for polydipsia and polyphagia.  Neurological: Negative for weakness.  Psychiatric/Behavioral: Negative for confusion.       Objective:   Physical Exam  Constitutional: He appears well-nourished. No distress.  Cardiovascular: Normal rate, regular rhythm and normal heart sounds.   No murmur heard. Pulmonary/Chest: Effort normal and breath sounds normal. No respiratory distress.  Musculoskeletal: He exhibits no edema.  Lymphadenopathy:    He has no cervical adenopathy.  Neurological: He is alert.  Psychiatric: His behavior is normal.  Vitals reviewed.         Assessment & Plan:  Patient with significant shoulder pain and discomfort that I believe is more than likely impingement on the nerve I recommend gabapentin 3 times daily gradually titrated up to this it problems follow-up pain medication may be used every 4 hours he states he is currently taking anywhere between 4-5 tablets a day he denies misusing it  Patient feels that his shoulder pain is being due to his cholesterol medicine he stopped it 2 days ago when he states he started to  feel better he would like to try this  Before going through any type of testing or referrals.

## 2015-08-24 ENCOUNTER — Ambulatory Visit (INDEPENDENT_AMBULATORY_CARE_PROVIDER_SITE_OTHER): Payer: BLUE CROSS/BLUE SHIELD | Admitting: Family Medicine

## 2015-08-24 VITALS — BP 152/86 | Ht 75.0 in | Wt 267.0 lb

## 2015-08-24 DIAGNOSIS — Z79891 Long term (current) use of opiate analgesic: Secondary | ICD-10-CM | POA: Diagnosis not present

## 2015-08-24 DIAGNOSIS — Z23 Encounter for immunization: Secondary | ICD-10-CM

## 2015-08-24 DIAGNOSIS — E119 Type 2 diabetes mellitus without complications: Secondary | ICD-10-CM | POA: Diagnosis not present

## 2015-08-24 DIAGNOSIS — G894 Chronic pain syndrome: Secondary | ICD-10-CM

## 2015-08-24 LAB — POCT GLYCOSYLATED HEMOGLOBIN (HGB A1C): HEMOGLOBIN A1C: 7

## 2015-08-24 MED ORDER — OXYCODONE-ACETAMINOPHEN 10-325 MG PO TABS: 1.0000 | ORAL_TABLET | ORAL | Status: DC | PRN

## 2015-08-24 NOTE — Progress Notes (Signed)
   Subjective:    Patient ID: [name], male    DOB: [birth year], 59 y.o.   MRN: [id number]  Diabetes He presents for his follow-up diabetic visit. He has type 2 diabetes mellitus. Pertinent negatives for hypoglycemia include no confusion. Pertinent negatives for diabetes include no chest pain, no fatigue, no polydipsia, no polyphagia and no weakness. He participates in exercise daily. His lunch blood glucose range is generally 110-130 mg/dl. He sees a podiatrist.Eye exam is not current.   This patient was seen today for chronic pain  The medication list was reviewed and updated.   -Compliance with medication: yes. Takes for shoulder pain.   - Number patient states they take daily: four a day  -when was the last dose patient took?  This am about 8 am  The patient was advised the importance of maintaining medication and not using illegal substances with these.  Refills needed: yes  The patient was educated that we can provide 3 monthly scripts for their medication, it is their responsibility to follow the instructions.  Side effects or complications from medications: none  Patient is aware that pain medications are meant to minimize the severity of the pain to allow their pain levels to improve to allow for better function. They are aware of that pain medications cannot totally remove their pain.  Due for UDT ( at least once per year) : has not done yet.   Patient states he does try to watch diet does try to stay physically active denies any chest tightness pressure pain or shortness of breath. Relates compliance with medicine. States medicine not causing him to feel drugged her drowsy helps him with his pain patient has chronic pain in lower back sciatica neuropathy of the feet as well as chronic shoulder pain      Review of Systems  Constitutional: Negative for activity change, appetite change and fatigue.  HENT: Negative for congestion.   Respiratory: Negative for cough.     Cardiovascular: Negative for chest pain.  Gastrointestinal: Negative for abdominal pain.  Endocrine: Negative for polydipsia and polyphagia.  Neurological: Negative for weakness.  Psychiatric/Behavioral: Negative for confusion.       Objective:   Physical Exam  Constitutional: He appears well-nourished. No distress.  Cardiovascular: Normal rate, regular rhythm and normal heart sounds.   No murmur heard. Pulmonary/Chest: Effort normal and breath sounds normal. No respiratory distress.  Musculoskeletal: He exhibits no edema.  Lymphadenopathy:    He has no cervical adenopathy.  Neurological: He is alert.  Psychiatric: His behavior is normal.  Vitals reviewed.         Assessment & Plan:  The patient was seen today as part of a comprehensive visit regarding pain control. Patient's compliance with the medication as well as discussion regarding effectiveness was completed. Prescriptions were written. Patient was advised to follow-up in 3 months. The patient was assessed for any signs of severe side effects. The patient was advised to take the medicine as directed and to report to [place] if any side effect issues.  Diabetes-fair control. Recommend aggressive diet exercise medications, A1c 7.0  Patient at increased risk for heart disease. Continue 81 mg aspirin daily  Neuropathy in feet Neurontin seems to help continue  Urine drug screen to be done today. Patient does admit to occasional marijuana use he was encouraged to minimize and avoid marijuana use he was also encouraged that if these haven't any spells of feeling drugged her drowsy not to drive at all

## 2015-08-24 NOTE — Patient Instructions (Signed)
Do eye exam soon  As part of your visit today we have covered your chronic pain. You have been given prescription(s) for pain medicines.The [place] and [place] require that any patient on pain medications must be seen every 3 months. You are expected to come in for a office visit before further pain medications are issued.  Since we are managing your pain do not get pain scripts from other doctors. We check the prescription registry regularly. If you are receiving pain medicines from another source we will STOP prescribing pain medicines.   We will not refill medications or early nor will we give an extended month supply at the end of these prescriptions.It is your responsibility to keep up with medications. They will not be replaced.  It is your responsibility to schedule an office visit in 3 months to be seen before you are out of your medication. Do not call our office to request early refills or additional refills. Do not wait till the last moment to schedule the follow up visit. We highly recommend you schedule this now for 3 months.  We believe that most patients take their meds as prescribed but drug misuse and diversion is a serious problem in the [place]. Our office does standard measures to insure proper care to all. All patients are subject to random urine drug screens/ saliva tests and random pill counts. Also all patients drug prescription records are reviewed on a regular basis in accordance with [facility] medical board policies.  Remember, do not use alcohol or illegal drugs with your pain medications. If you are feeling drowsy or affected by your medicine you are not to operate any machinery , do any dangerous activities or drive while this is occurring.   We are required by law to adhere to strict regulations. Failure on our part to follow these regulations could jeopardize our prescription license which in turn would cause [place] not to be able to care for you.Thank you for your  understanding and following these policies.

## 2015-08-26 ENCOUNTER — Other Ambulatory Visit: Payer: Self-pay

## 2015-08-26 MED ORDER — GABAPENTIN 100 MG PO CAPS: 100.0000 mg | ORAL_CAPSULE | Freq: Three times a day (TID) | ORAL | Status: DC

## 2015-08-30 LAB — TOXASSURE SELECT 13 (MW), URINE: PDF: 0

## 2015-09-01 ENCOUNTER — Encounter: Payer: Self-pay | Admitting: Family

## 2015-09-04 ENCOUNTER — Encounter (HOSPITAL_COMMUNITY): Payer: BLUE CROSS/BLUE SHIELD

## 2015-09-04 ENCOUNTER — Ambulatory Visit: Payer: BLUE CROSS/BLUE SHIELD | Admitting: Family

## 2015-09-28 ENCOUNTER — Other Ambulatory Visit: Payer: Self-pay | Admitting: Family Medicine

## 2015-10-18 ENCOUNTER — Other Ambulatory Visit: Payer: Self-pay | Admitting: Family Medicine

## 2015-10-21 ENCOUNTER — Telehealth: Payer: Self-pay | Admitting: Family Medicine

## 2015-10-21 MED ORDER — INSULIN DETEMIR 100 UNIT/ML FLEXPEN: 36.0000 [IU] | PEN_INJECTOR | Freq: Every day | SUBCUTANEOUS | Status: DC

## 2015-10-21 NOTE — Telephone Encounter (Signed)
Pt's insurance will no longer cover lantis. The pt's insurance will however tresiba, Water quality scientist. Pt will need one of this called in instead in 90 day supplies.

## 2015-10-21 NOTE — Telephone Encounter (Signed)
levimir works the same way at the same dosing intructions, plz send in with 6 month rf

## 2015-10-21 NOTE — Telephone Encounter (Signed)
Notified patient med was sent to pharmacy.  

## 2015-11-03 ENCOUNTER — Other Ambulatory Visit: Payer: Self-pay | Admitting: Family Medicine

## 2015-11-24 ENCOUNTER — Ambulatory Visit (INDEPENDENT_AMBULATORY_CARE_PROVIDER_SITE_OTHER): Payer: BLUE CROSS/BLUE SHIELD | Admitting: Family Medicine

## 2015-11-24 ENCOUNTER — Encounter: Payer: Self-pay | Admitting: Family Medicine

## 2015-11-24 VITALS — BP 106/70 | Ht 75.0 in | Wt 270.4 lb

## 2015-11-24 DIAGNOSIS — Z794 Long term (current) use of insulin: Secondary | ICD-10-CM | POA: Diagnosis not present

## 2015-11-24 DIAGNOSIS — E119 Type 2 diabetes mellitus without complications: Secondary | ICD-10-CM

## 2015-11-24 LAB — POCT GLYCOSYLATED HEMOGLOBIN (HGB A1C): HEMOGLOBIN A1C: 7.3

## 2015-11-24 MED ORDER — OXYCODONE-ACETAMINOPHEN 10-325 MG PO TABS: 1.0000 | ORAL_TABLET | ORAL | Status: DC | PRN

## 2015-11-24 NOTE — Patient Instructions (Signed)

## 2015-11-24 NOTE — Progress Notes (Signed)
   Subjective:    Patient ID: [name], male    DOB: 03-15-1956, 60 y.o.   MRN: [id number]  Diabetes He presents for his follow-up diabetic visit. He has type 2 diabetes mellitus. There are no hypoglycemic associated symptoms. There are no diabetic associated symptoms. There are no hypoglycemic complications. There are no diabetic complications. There are no known risk factors for coronary artery disease. Current diabetic treatment includes insulin injections. He is compliant with treatment all of the time.   Patient states that he needs to schedule an updated eye exam appointment.   This patient was seen today for chronic pain  The medication list was reviewed and updated.   -Compliance with medication: yes  - Number patient states they take daily: 4-5 daily   -when was the last dose patient took: this morning  The patient was advised the importance of maintaining medication and not using illegal substances with these.  Refills needed: yes  The patient was educated that we can provide 3 monthly scripts for their medication, it is their responsibility to follow the instructions.  Side effects or complications from medications: none  Patient is aware that pain medications are meant to minimize the severity of the pain to allow their pain levels to improve to allow for better function. They are aware of that pain medications cannot totally remove their pain.  Due for UTD (at least once per year): utd  Patient has no concerns at this time.  Patient states diabetes under fair control has been eating foods that he really should be but he is trying to monitor it better lately.   Patient states that he is tolerating his cholesterol medicine and his aspirin and denies any cardiac symptoms currently  Review of Systems He denies any chest tightness pressure pain shortness breath nausea vomiting diarrhea denies fever sweats chills does relate numbness in the feet    Objective:   Physical Exam  His lungs are clear hearts regular pulse normal blood pressure good abdomen soft no guarding rebound extremities no edema      Assessment & Plan:  Hypertension good control continue current measures Neuropathy in the feet gabapentin seems to help continue this Hyperlipidemia I don't recommend checking lab work currently continue medication Diabetes good control continue current measures Chronic pain with his back his knees as well as feet he does not abuse a medication 3 prescriptions were given follow-up in 3 months time  25 minutes was spent with the patient. Greater than half the time was spent in discussion and answering questions and counseling regarding the issues that the patient came in for today.

## 2016-02-22 ENCOUNTER — Encounter: Payer: Self-pay | Admitting: Family Medicine

## 2016-02-22 ENCOUNTER — Ambulatory Visit (INDEPENDENT_AMBULATORY_CARE_PROVIDER_SITE_OTHER): Payer: BLUE CROSS/BLUE SHIELD | Admitting: Family Medicine

## 2016-02-22 VITALS — BP 132/72 | Ht 75.0 in | Wt 267.0 lb

## 2016-02-22 DIAGNOSIS — E119 Type 2 diabetes mellitus without complications: Secondary | ICD-10-CM

## 2016-02-22 DIAGNOSIS — E785 Hyperlipidemia, unspecified: Secondary | ICD-10-CM

## 2016-02-22 DIAGNOSIS — G894 Chronic pain syndrome: Secondary | ICD-10-CM | POA: Diagnosis not present

## 2016-02-22 LAB — POCT GLYCOSYLATED HEMOGLOBIN (HGB A1C): HEMOGLOBIN A1C: 7.6

## 2016-02-22 MED ORDER — OXYCODONE-ACETAMINOPHEN 10-325 MG PO TABS: 1.0000 | ORAL_TABLET | ORAL | Status: DC | PRN

## 2016-02-22 MED ORDER — INSULIN DETEMIR 100 UNIT/ML FLEXPEN: PEN_INJECTOR | SUBCUTANEOUS | Status: DC

## 2016-02-22 MED ORDER — EMPAGLIFLOZIN 25 MG PO TABS: 25.0000 mg | ORAL_TABLET | Freq: Every day | ORAL | Status: DC

## 2016-02-22 NOTE — Progress Notes (Addendum)
Subjective:    Patient ID: [name], male    DOB: 1955/12/21, 60 y.o.   MRN: [id number]  Diabetes He presents for his follow-up diabetic visit. He has type 2 diabetes mellitus. No MedicAlert identification noted. Pertinent negatives for hypoglycemia include no confusion. Pertinent negatives for diabetes include no chest pain, no fatigue, no polydipsia, no polyphagia and no weakness. He has not had a previous visit with a dietitian. He sees a podiatrist.Eye exam is not current.   This patient was seen today for chronic pain  The medication list was reviewed and updated.   -Compliance with medication: Takes daily as prescribed - Number patient states they take daily: 4-5 tablets daily  -when was the last dose patient took? Last dose 0500  The patient was advised the importance of maintaining medication and not using illegal substances with these.  Refills needed: Yes  The patient was educated that we can provide 3 monthly scripts for their medication, it is their responsibility to follow the instructions.  Side effects or complications from medications: None  Patient is aware that pain medications are meant to minimize the severity of the pain to allow their pain levels to improve to allow for better function. They are aware of that pain medications cannot totally remove their pain.  Due for UDT ( at least once per year) : Last Drug screen 08/24/2015  Patient has concerns of increased shoulder pain.Patient states ever since he stopped drinking diet sodas he's noticed the shoulders are actually improving some does not radiate down his arms.    Results for orders placed or performed in visit on 02/22/16  POCT HgB A1C  Result Value Ref Range   Hemoglobin A1C 7.6    25 minutes was spent with the patient. Greater than half the time was spent in discussion and answering questions and counseling regarding the issues that the patient came in for today.     Review of Systems    Constitutional: Negative for activity change, appetite change and fatigue.  HENT: Negative for congestion.   Respiratory: Negative for cough.   Cardiovascular: Negative for chest pain.  Gastrointestinal: Negative for abdominal pain.  Endocrine: Negative for polydipsia and polyphagia.  Neurological: Negative for weakness.  Psychiatric/Behavioral: Negative for confusion.       Objective:   Physical Exam  Constitutional: He appears well-nourished. No distress.  Cardiovascular: Normal rate, regular rhythm and normal heart sounds.   No murmur heard. Pulmonary/Chest: Effort normal and breath sounds normal. No respiratory distress.  Musculoskeletal: He exhibits no edema.  Lymphadenopathy:    He has no cervical adenopathy.  Neurological: He is alert.  Psychiatric: His behavior is normal.  Vitals reviewed.   Patient has evidence of amputation on his feet as well as pre-ulcerative calluses as well as poor circulation and neuropathy patient does need diabetic shoes      Assessment & Plan:  Diabetes-overall good control. A1c not as good as I like to see but fairly decent he does need to do a better job of adjusting his long-acting insulin to try to get his morning sugars down 101 20 he will increase from 36 to a new dose of 40 units each evening if he starts having low spells he will let us know he'll watch diet stay physically active  Chronic pain-The patient was seen today as part of a comprehensive visit regarding pain control. Patient's compliance with the medication as well as discussion regarding effectiveness was completed. Prescriptions were written. Patient  was advised to follow-up in 3 months. The patient was assessed for any signs of severe side effects. The patient was advised to take the medicine as directed and to report to [place] if any side effect issues.  Hyperlipidemia continue current medication watch diet closely patient tolerating it well recent labs reviewed with  patient  Patient due for screening lab work lab work ordered patient will do along with A1c before his next visit in 3 months

## 2016-03-05 ENCOUNTER — Other Ambulatory Visit: Payer: Self-pay | Admitting: Family Medicine

## 2016-05-04 ENCOUNTER — Other Ambulatory Visit: Payer: Self-pay

## 2016-05-04 MED ORDER — ROSUVASTATIN CALCIUM 5 MG PO TABS: 5.0000 mg | ORAL_TABLET | Freq: Every day | ORAL | Status: DC

## 2016-05-24 ENCOUNTER — Telehealth: Payer: Self-pay | Admitting: Family Medicine

## 2016-05-24 ENCOUNTER — Ambulatory Visit (INDEPENDENT_AMBULATORY_CARE_PROVIDER_SITE_OTHER): Payer: BLUE CROSS/BLUE SHIELD | Admitting: Family Medicine

## 2016-05-24 ENCOUNTER — Encounter: Payer: Self-pay | Admitting: Family Medicine

## 2016-05-24 VITALS — BP 120/60 | Ht 75.0 in | Wt 254.2 lb

## 2016-05-24 DIAGNOSIS — E119 Type 2 diabetes mellitus without complications: Secondary | ICD-10-CM | POA: Diagnosis not present

## 2016-05-24 DIAGNOSIS — E785 Hyperlipidemia, unspecified: Secondary | ICD-10-CM

## 2016-05-24 DIAGNOSIS — Z125 Encounter for screening for malignant neoplasm of prostate: Secondary | ICD-10-CM

## 2016-05-24 DIAGNOSIS — G894 Chronic pain syndrome: Secondary | ICD-10-CM | POA: Diagnosis not present

## 2016-05-24 DIAGNOSIS — Z79899 Other long term (current) drug therapy: Secondary | ICD-10-CM | POA: Diagnosis not present

## 2016-05-24 LAB — POCT GLYCOSYLATED HEMOGLOBIN (HGB A1C): Hemoglobin A1C: 6.5

## 2016-05-24 MED ORDER — OXYCODONE-ACETAMINOPHEN 10-325 MG PO TABS: 1.0000 | ORAL_TABLET | ORAL | 0 refills | Status: DC | PRN

## 2016-05-24 NOTE — Patient Instructions (Signed)

## 2016-05-24 NOTE — Telephone Encounter (Signed)
Nurses please call patient. Let him know his last colonoscopy was 2009. The patient if he has any immediate family members with colon cancer?. If not then he will be due in 2019 for his next colonoscopy. Also let the patient know that I did research Vania Rea- information from [place] shows there is no increased risk of amputation due to this medication. They looked at our medical study compared to groups 1 group who was on this medicine and the other one was not taking. This medicine. The rate of amputation was the same in both groups. So therefore this medication is not felt to put the patient at increased risk of amputation. For now I would continue taking it. This could change with future studies.

## 2016-05-24 NOTE — Progress Notes (Signed)
   Subjective:    Patient ID: [name], male    DOB: 1956-03-21, 60 y.o.   MRN: [id number]  HPI DiabetesHe states he does try to watch his diet taking his medicine on a regular basis he denies any other particular problems. A low sugar spells. Denies any complications from his diabetes currently. Patient does have vascular issues as well as patient had previous amputation foot. We will be looking at one of his medications as he is a take or recommend stopping  Lipid he does take his cholesterol medicine on regular basis denies missing any doses states his been try to watch diet stay physically active  Patient is due for PSA patient defers on colonoscopy states he had one several years ago with Dr. Oneida Alar   Chronic pain states he takes his medicine as directed denies abusing the medicine. States overall he tries to adhere to take the medicine properly. He knows the risk factors regarding overdoses misusing medicines.     Review of Systems  Constitutional: Negative for activity change, appetite change and fatigue.  HENT: Negative for congestion.   Respiratory: Negative for cough.   Cardiovascular: Negative for chest pain.  Gastrointestinal: Negative for abdominal pain.  Endocrine: Negative for polydipsia and polyphagia.  Neurological: Negative for weakness.  Psychiatric/Behavioral: Negative for confusion.       Objective:   Physical Exam  Constitutional: He appears well-nourished. No distress.  Cardiovascular: Normal rate, regular rhythm and normal heart sounds.   No murmur heard. Pulmonary/Chest: Effort normal and breath sounds normal. No respiratory distress.  Musculoskeletal: He exhibits no edema.  Lymphadenopathy:    He has no cervical adenopathy.  Neurological: He is alert.  Psychiatric: His behavior is normal.  Vitals reviewed.         Assessment & Plan:  Diabetes very good control continue current measures patient denies any low sugar spells  Hyperlipidemia  recent lab work reviewed with patient patient continue medication check and lab work await the results  Chronic pain patient does not abuse medicine. Registry was checked. Urine drug screen later this year. 3 prescriptions given. Warning signs regarding medication misuse discussed.  PSA ordered  25 minutes spent with patient greater than half in discussion of multiple issues including diabetes medications and the safety of these medicines.

## 2016-05-25 NOTE — Telephone Encounter (Signed)
Left message to return call 

## 2016-05-25 NOTE — Telephone Encounter (Signed)
Discussed with pt.   his last colonoscopy was 2009. The patient states he does not have any immediate family members with colon cancer.  he will be due in 2019 for his next colonoscopy. Dr Nicki Reaper did research [name]- information from [place] shows there is no increased risk of amputation due to this medication. They looked at our medical study compared to groups 1 group who was on this medicine and the other one was not taking. This medicine. The rate of amputation was the same in both groups. So therefore this medication is not felt to put the patient at increased risk of amputation. For now I would continue taking it. This could change with future studies. Pt verbalized understanding.

## 2016-08-18 ENCOUNTER — Other Ambulatory Visit: Payer: Self-pay | Admitting: *Deleted

## 2016-08-18 MED ORDER — ROSUVASTATIN CALCIUM 5 MG PO TABS: 5.0000 mg | ORAL_TABLET | Freq: Every day | ORAL | 0 refills | Status: DC

## 2016-08-24 ENCOUNTER — Ambulatory Visit (INDEPENDENT_AMBULATORY_CARE_PROVIDER_SITE_OTHER): Payer: BLUE CROSS/BLUE SHIELD | Admitting: Family Medicine

## 2016-08-24 ENCOUNTER — Encounter: Payer: Self-pay | Admitting: Family Medicine

## 2016-08-24 VITALS — BP 136/80 | Ht 75.0 in | Wt 255.5 lb

## 2016-08-24 DIAGNOSIS — E784 Other hyperlipidemia: Secondary | ICD-10-CM | POA: Diagnosis not present

## 2016-08-24 DIAGNOSIS — M170 Bilateral primary osteoarthritis of knee: Secondary | ICD-10-CM | POA: Diagnosis not present

## 2016-08-24 DIAGNOSIS — L97411 Non-pressure chronic ulcer of right heel and midfoot limited to breakdown of skin: Secondary | ICD-10-CM

## 2016-08-24 DIAGNOSIS — E119 Type 2 diabetes mellitus without complications: Secondary | ICD-10-CM | POA: Diagnosis not present

## 2016-08-24 DIAGNOSIS — E7849 Other hyperlipidemia: Secondary | ICD-10-CM

## 2016-08-24 DIAGNOSIS — E11621 Type 2 diabetes mellitus with foot ulcer: Secondary | ICD-10-CM

## 2016-08-24 DIAGNOSIS — Z125 Encounter for screening for malignant neoplasm of prostate: Secondary | ICD-10-CM | POA: Diagnosis not present

## 2016-08-24 DIAGNOSIS — Z23 Encounter for immunization: Secondary | ICD-10-CM

## 2016-08-24 DIAGNOSIS — Z79891 Long term (current) use of opiate analgesic: Secondary | ICD-10-CM

## 2016-08-24 DIAGNOSIS — I70234 Atherosclerosis of native arteries of right leg with ulceration of heel and midfoot: Secondary | ICD-10-CM | POA: Diagnosis not present

## 2016-08-24 LAB — POCT GLYCOSYLATED HEMOGLOBIN (HGB A1C): Hemoglobin A1C: 7

## 2016-08-24 MED ORDER — OXYCODONE-ACETAMINOPHEN 10-325 MG PO TABS: 1.0000 | ORAL_TABLET | ORAL | 0 refills | Status: DC | PRN

## 2016-08-24 NOTE — Progress Notes (Signed)
Subjective:    Patient ID: [name], male    DOB: 04-16-1956, 60 y.o.   MRN: [id number]  Diabetes  He presents for his follow-up diabetic visit. He has type 2 diabetes mellitus. No MedicAlert identification noted. Pertinent negatives for hypoglycemia include no confusion. Pertinent negatives for diabetes include no chest pain, no fatigue, no polydipsia, no polyphagia and no weakness. He has not had a previous visit with a dietitian. He sees a podiatrist.Eye exam is not current.    Results for orders placed or performed in visit on 08/24/16  POCT HgB A1C  Result Value Ref Range   Hemoglobin A1C 7.0    This patient also relates that he has an ulcer on his foot for which he is being tended to by podiatry. He would like for [place] to take a look at it. He denies any calf pain or ulcerations on the mid leg. He does have known peripheral vascular disease. He also has hyperlipidemia and takes insulin a regular basis. Patient does smoke intermittently he's been counseled to quit smoking He has osteoarthritis in both knees which is stable but takes pain medicine help manage his Patient does need screening PSA  This patient was seen today for chronic pain  The medication list was reviewed and updated.   -Compliance with medication: Yes takes daily   - Number patient states they take daily: Takes 4-5 tablets daily  -when was the last dose patient took? Last dose 0430  The patient was advised the importance of maintaining medication and not using illegal substances with these.  Refills needed: Yes  The patient was educated that we can provide 3 monthly scripts for their medication, it is their responsibility to follow the instructions.  Side effects or complications from medications: None  Patient is aware that pain medications are meant to minimize the severity of the pain to allow their pain levels to improve to allow for better function. They are aware of that pain medications cannot  totally remove their pain.  Due for UDT ( at least once per year) : Today.  States no other concerns this visit.        Review of Systems  Constitutional: Negative for activity change, appetite change and fatigue.  HENT: Negative for congestion.   Respiratory: Negative for cough.   Cardiovascular: Negative for chest pain.  Gastrointestinal: Negative for abdominal pain.  Endocrine: Negative for polydipsia and polyphagia.  Neurological: Negative for weakness.  Psychiatric/Behavioral: Negative for confusion.       Objective:   Physical Exam  Constitutional: He appears well-nourished. No distress.  Cardiovascular: Normal rate, regular rhythm and normal heart sounds.   No murmur heard. Pulmonary/Chest: Effort normal and breath sounds normal. No respiratory distress.  Musculoskeletal: He exhibits no edema.  Lymphadenopathy:    He has no cervical adenopathy.  Neurological: He is alert.  Psychiatric: His behavior is normal.  Vitals reviewed.  Diabetic foot ulcer noted on the right foot. There is no necrotic tissue. It appears to be through the skin into the dermal layer possibly into the fat pad no sign of osteomyelitis being followed by podiatry       Assessment & Plan:  Diabetes-decent control with A1c at 7 encourage healthy diet regular physical activity continue medications. That does need blood work  Hyperlipidemia continue current medication previous labs reviewed Ordered  Hypertension overall good control continue current medications  Heart disease stable  Diabetic foot ulcer being followed by podiatrist. Seems to be under  good control continue antibiotic  Patient does have peripheral vascular disease which is controlling to this issue avoid smoking keep cholesterol under control keep blood pressure under control as well as diabetes proper foot care on a regular basis  Chronic back pain and osteoarthritis pain medications were prescribed and continued. 3 scripts  written today. Urine drug screen collected. The patient was seen today as part of a comprehensive visit regarding pain control. Patient's compliance with the medication as well as discussion regarding effectiveness was completed. Prescriptions were written. Patient was advised to follow-up in 3 months. The patient was assessed for any signs of severe side effects. The patient was advised to take the medicine as directed and to report to [place] if any side effect issues.

## 2016-08-25 LAB — BASIC METABOLIC PANEL
BUN: 19 mg/dL (ref 7–25)
CHLORIDE: 102 mmol/L (ref 98–110)
CO2: 27 mmol/L (ref 20–31)
CREATININE: 0.77 mg/dL (ref 0.70–1.25)
Calcium: 9.4 mg/dL (ref 8.6–10.3)
Glucose, Bld: 89 mg/dL (ref 65–99)
POTASSIUM: 4.2 mmol/L (ref 3.5–5.3)
SODIUM: 138 mmol/L (ref 135–146)

## 2016-08-25 LAB — HEPATIC FUNCTION PANEL
ALBUMIN: 4.3 g/dL (ref 3.6–5.1)
ALK PHOS: 58 U/L (ref 40–115)
ALT: 11 U/L (ref 9–46)
AST: 16 U/L (ref 10–35)
BILIRUBIN TOTAL: 0.6 mg/dL (ref 0.2–1.2)
Bilirubin, Direct: 0.1 mg/dL (ref <=0.2)
Indirect Bilirubin: 0.5 mg/dL (ref 0.2–1.2)
TOTAL PROTEIN: 6.7 g/dL (ref 6.1–8.1)

## 2016-08-25 LAB — LIPID PANEL
CHOL/HDL RATIO: 3.9 ratio (ref <5.0)
Cholesterol: 124 mg/dL (ref <200)
HDL: 32 mg/dL — AB (ref >40)
LDL CALC: 77 mg/dL
TRIGLYCERIDES: 75 mg/dL (ref <150)
VLDL: 15 mg/dL (ref <30)

## 2016-08-25 LAB — MICROALBUMIN / CREATININE URINE RATIO
Creatinine, Urine: 71 mg/dL (ref 20–370)
Microalb Creat Ratio: 32 mcg/mg creat — ABNORMAL HIGH (ref <30)
Microalb, Ur: 2.3 mg/dL

## 2016-08-25 LAB — PSA: PSA: 0.3 ng/mL (ref <=4.0)

## 2016-08-31 LAB — TOXASSURE SELECT 13 (MW), URINE

## 2016-09-07 ENCOUNTER — Other Ambulatory Visit: Payer: Self-pay | Admitting: Family Medicine

## 2016-09-13 ENCOUNTER — Telehealth: Payer: Self-pay | Admitting: Family Medicine

## 2016-09-13 ENCOUNTER — Encounter (HOSPITAL_COMMUNITY): Payer: Self-pay

## 2016-09-13 ENCOUNTER — Inpatient Hospital Stay (HOSPITAL_COMMUNITY)
Admission: AD | Admit: 2016-09-13 | Discharge: 2016-09-16 | DRG: 240 | Disposition: A | Discharge status: Home Health | Payer: BLUE CROSS/BLUE SHIELD | Source: Ambulatory Visit | Attending: Family Medicine | Admitting: Family Medicine

## 2016-09-13 ENCOUNTER — Inpatient Hospital Stay (HOSPITAL_COMMUNITY): Payer: BLUE CROSS/BLUE SHIELD

## 2016-09-13 DIAGNOSIS — I739 Peripheral vascular disease, unspecified: Secondary | ICD-10-CM

## 2016-09-13 DIAGNOSIS — I2510 Atherosclerotic heart disease of native coronary artery without angina pectoris: Secondary | ICD-10-CM | POA: Diagnosis present

## 2016-09-13 DIAGNOSIS — M79671 Pain in right foot: Secondary | ICD-10-CM | POA: Diagnosis present

## 2016-09-13 DIAGNOSIS — Z6832 Body mass index (BMI) 32.0-32.9, adult: Secondary | ICD-10-CM

## 2016-09-13 DIAGNOSIS — Z888 Allergy status to other drugs, medicaments and biological substances status: Secondary | ICD-10-CM | POA: Diagnosis not present

## 2016-09-13 DIAGNOSIS — L02611 Cutaneous abscess of right foot: Secondary | ICD-10-CM | POA: Diagnosis present

## 2016-09-13 DIAGNOSIS — I110 Hypertensive heart disease with heart failure: Secondary | ICD-10-CM | POA: Diagnosis present

## 2016-09-13 DIAGNOSIS — L97519 Non-pressure chronic ulcer of other part of right foot with unspecified severity: Secondary | ICD-10-CM | POA: Diagnosis present

## 2016-09-13 DIAGNOSIS — E7800 Pure hypercholesterolemia, unspecified: Secondary | ICD-10-CM | POA: Diagnosis present

## 2016-09-13 DIAGNOSIS — M00871 Arthritis due to other bacteria, right ankle and foot: Secondary | ICD-10-CM | POA: Diagnosis present

## 2016-09-13 DIAGNOSIS — Z9861 Coronary angioplasty status: Secondary | ICD-10-CM

## 2016-09-13 DIAGNOSIS — G8929 Other chronic pain: Secondary | ICD-10-CM | POA: Diagnosis present

## 2016-09-13 DIAGNOSIS — M869 Osteomyelitis, unspecified: Secondary | ICD-10-CM | POA: Diagnosis present

## 2016-09-13 DIAGNOSIS — L089 Local infection of the skin and subcutaneous tissue, unspecified: Secondary | ICD-10-CM | POA: Diagnosis not present

## 2016-09-13 DIAGNOSIS — E11628 Type 2 diabetes mellitus with other skin complications: Secondary | ICD-10-CM | POA: Diagnosis present

## 2016-09-13 DIAGNOSIS — Z885 Allergy status to narcotic agent status: Secondary | ICD-10-CM | POA: Diagnosis not present

## 2016-09-13 DIAGNOSIS — Z9889 Other specified postprocedural states: Secondary | ICD-10-CM

## 2016-09-13 DIAGNOSIS — R2689 Other abnormalities of gait and mobility: Secondary | ICD-10-CM

## 2016-09-13 DIAGNOSIS — E11621 Type 2 diabetes mellitus with foot ulcer: Secondary | ICD-10-CM | POA: Diagnosis present

## 2016-09-13 DIAGNOSIS — Z794 Long term (current) use of insulin: Secondary | ICD-10-CM

## 2016-09-13 DIAGNOSIS — Z7982 Long term (current) use of aspirin: Secondary | ICD-10-CM

## 2016-09-13 DIAGNOSIS — E1169 Type 2 diabetes mellitus with other specified complication: Secondary | ICD-10-CM

## 2016-09-13 DIAGNOSIS — Z79899 Other long term (current) drug therapy: Secondary | ICD-10-CM | POA: Diagnosis not present

## 2016-09-13 DIAGNOSIS — M549 Dorsalgia, unspecified: Secondary | ICD-10-CM | POA: Diagnosis present

## 2016-09-13 DIAGNOSIS — E1151 Type 2 diabetes mellitus with diabetic peripheral angiopathy without gangrene: Principal | ICD-10-CM | POA: Diagnosis present

## 2016-09-13 DIAGNOSIS — E669 Obesity, unspecified: Secondary | ICD-10-CM | POA: Diagnosis present

## 2016-09-13 DIAGNOSIS — I509 Heart failure, unspecified: Secondary | ICD-10-CM | POA: Diagnosis present

## 2016-09-13 DIAGNOSIS — L03115 Cellulitis of right lower limb: Secondary | ICD-10-CM | POA: Diagnosis present

## 2016-09-13 DIAGNOSIS — I252 Old myocardial infarction: Secondary | ICD-10-CM | POA: Diagnosis not present

## 2016-09-13 DIAGNOSIS — Z419 Encounter for procedure for purposes other than remedying health state, unspecified: Secondary | ICD-10-CM

## 2016-09-13 DIAGNOSIS — Z89411 Acquired absence of right great toe: Secondary | ICD-10-CM

## 2016-09-13 DIAGNOSIS — Z87891 Personal history of nicotine dependence: Secondary | ICD-10-CM | POA: Diagnosis not present

## 2016-09-13 DIAGNOSIS — E119 Type 2 diabetes mellitus without complications: Secondary | ICD-10-CM

## 2016-09-13 LAB — BASIC METABOLIC PANEL
Anion gap: 9 (ref 5–15)
BUN: 23 mg/dL — ABNORMAL HIGH (ref 6–20)
CALCIUM: 8.7 mg/dL — AB (ref 8.9–10.3)
CO2: 26 mmol/L (ref 22–32)
CREATININE: 0.88 mg/dL (ref 0.61–1.24)
Chloride: 101 mmol/L (ref 101–111)
GFR calc non Af Amer: >60 mL/min (ref >60)
Glucose, Bld: 149 mg/dL — ABNORMAL HIGH (ref 65–99)
Potassium: 4.1 mmol/L (ref 3.5–5.1)
SODIUM: 136 mmol/L (ref 135–145)

## 2016-09-13 LAB — CBC WITH DIFFERENTIAL/PLATELET
BASOS PCT: 0 %
Basophils Absolute: 0 10*3/uL (ref 0.0–0.1)
EOS ABS: 0.4 10*3/uL (ref 0.0–0.7)
EOS PCT: 3 %
HCT: 39.6 % (ref 39.0–52.0)
HEMOGLOBIN: 13.2 g/dL (ref 13.0–17.0)
Lymphocytes Relative: 6 %
Lymphs Abs: 0.7 10*3/uL (ref 0.7–4.0)
MCH: 28.9 pg (ref 26.0–34.0)
MCHC: 33.3 g/dL (ref 30.0–36.0)
MCV: 86.7 fL (ref 78.0–100.0)
MONOS PCT: 10 %
Monocytes Absolute: 1.2 10*3/uL — ABNORMAL HIGH (ref 0.1–1.0)
NEUTROS PCT: 81 %
Neutro Abs: 9.2 10*3/uL — ABNORMAL HIGH (ref 1.7–7.7)
PLATELETS: 231 10*3/uL (ref 150–400)
RBC: 4.57 MIL/uL (ref 4.22–5.81)
RDW: 13.2 % (ref 11.5–15.5)
WBC: 11.4 10*3/uL — AB (ref 4.0–10.5)

## 2016-09-13 LAB — SURGICAL PCR SCREEN
MRSA, PCR: NEGATIVE
Staphylococcus aureus: NEGATIVE

## 2016-09-13 LAB — GLUCOSE, CAPILLARY: GLUCOSE-CAPILLARY: 148 mg/dL — AB (ref 65–99)

## 2016-09-13 MED ORDER — DEXTROSE 5 % IV SOLN
2.0000 g | INTRAVENOUS | Status: DC
  Administered 2016-09-13 – 2016-09-16 (×4): 2 g via INTRAVENOUS
  Filled 2016-09-13 (×7): qty 2

## 2016-09-13 MED ORDER — SODIUM CHLORIDE 0.9% FLUSH
3.0000 mL | Freq: Two times a day (BID) | INTRAVENOUS | Status: DC
  Administered 2016-09-14 – 2016-09-16 (×4): 3 mL via INTRAVENOUS

## 2016-09-13 MED ORDER — OXYCODONE-ACETAMINOPHEN 10-325 MG PO TABS: 1.0000 | ORAL_TABLET | ORAL | Status: DC | PRN

## 2016-09-13 MED ORDER — METRONIDAZOLE 500 MG PO TABS
500.0000 mg | ORAL_TABLET | Freq: Three times a day (TID) | ORAL | Status: DC
  Administered 2016-09-13 – 2016-09-16 (×9): 500 mg via ORAL
  Filled 2016-09-13 (×9): qty 1

## 2016-09-13 MED ORDER — ONDANSETRON HCL 4 MG PO TABS
4.0000 mg | ORAL_TABLET | Freq: Four times a day (QID) | ORAL | Status: DC | PRN
Start: 2016-09-13 — End: 2016-09-16

## 2016-09-13 MED ORDER — ASPIRIN EC 81 MG PO TBEC
81.0000 mg | DELAYED_RELEASE_TABLET | Freq: Every day | ORAL | Status: DC
  Administered 2016-09-13 – 2016-09-16 (×4): 81 mg via ORAL
  Filled 2016-09-13 (×4): qty 1

## 2016-09-13 MED ORDER — INSULIN DETEMIR 100 UNIT/ML ~~LOC~~ SOLN
48.0000 [IU] | Freq: Every day | SUBCUTANEOUS | Status: DC
  Administered 2016-09-13 – 2016-09-14 (×2): 48 [IU] via SUBCUTANEOUS
  Filled 2016-09-13 (×3): qty 0.48

## 2016-09-13 MED ORDER — ACETAMINOPHEN 325 MG PO TABS
650.0000 mg | ORAL_TABLET | Freq: Four times a day (QID) | ORAL | Status: DC | PRN
Start: 2016-09-13 — End: 2016-09-16

## 2016-09-13 MED ORDER — ACETAMINOPHEN 650 MG RE SUPP
650.0000 mg | Freq: Four times a day (QID) | RECTAL | Status: DC | PRN
Start: 2016-09-13 — End: 2016-09-16

## 2016-09-13 MED ORDER — SODIUM CHLORIDE 0.9% FLUSH: 3.0000 mL | INTRAVENOUS | Status: DC | PRN

## 2016-09-13 MED ORDER — OXYCODONE-ACETAMINOPHEN 5-325 MG PO TABS
1.0000 | ORAL_TABLET | ORAL | Status: DC | PRN
  Administered 2016-09-13 – 2016-09-16 (×12): 1 via ORAL
  Filled 2016-09-13 (×12): qty 1

## 2016-09-13 MED ORDER — ONDANSETRON HCL 4 MG/2ML IJ SOLN: 4.0000 mg | Freq: Four times a day (QID) | INTRAMUSCULAR | Status: DC | PRN

## 2016-09-13 MED ORDER — SODIUM CHLORIDE 0.9 % IV SOLN: 250.0000 mL | INTRAVENOUS | Status: DC | PRN

## 2016-09-13 MED ORDER — ADULT MULTIVITAMIN W/MINERALS CH
1.0000 | ORAL_TABLET | Freq: Every day | ORAL | Status: DC
  Administered 2016-09-13 – 2016-09-16 (×4): 1 via ORAL
  Filled 2016-09-13 (×4): qty 1

## 2016-09-13 MED ORDER — SODIUM CHLORIDE 0.9% FLUSH
3.0000 mL | Freq: Two times a day (BID) | INTRAVENOUS | Status: DC
  Administered 2016-09-13 – 2016-09-16 (×7): 3 mL via INTRAVENOUS

## 2016-09-13 MED ORDER — ROSUVASTATIN CALCIUM 10 MG PO TABS
5.0000 mg | ORAL_TABLET | Freq: Every day | ORAL | Status: DC
  Administered 2016-09-13 – 2016-09-15 (×3): 5 mg via ORAL
  Filled 2016-09-13 (×3): qty 1

## 2016-09-13 MED ORDER — PRO-STAT SUGAR FREE PO LIQD
30.0000 mL | Freq: Three times a day (TID) | ORAL | Status: DC
  Administered 2016-09-13 – 2016-09-16 (×8): 30 mL via ORAL
  Filled 2016-09-13 (×8): qty 30

## 2016-09-13 MED ORDER — OXYCODONE HCL 5 MG PO TABS
5.0000 mg | ORAL_TABLET | ORAL | Status: DC | PRN
  Administered 2016-09-14 – 2016-09-16 (×11): 5 mg via ORAL
  Filled 2016-09-13 (×11): qty 1

## 2016-09-13 NOTE — Telephone Encounter (Signed)
Call from Dr. Caprice Beaver  Direct Admit request  87yom PMH DM with right foot ulcer failing outpt tx with Levaquin. T 99 BP 142/88 HR 94  Needs IV abx, MRI, Dr. Caprice Beaver will see later today.  Accepted to medical bed.  Murray Hodgkins, MD [facility] [phone]

## 2016-09-13 NOTE — Progress Notes (Signed)
Nutrition Follow-up   INTERVENTION:  ProStat 30 ml TID (each 30 ml provides 100 kcal, 15 gr protein)   Add multivitamin daily  NUTRITION DIAGNOSIS:    Increased nutrient needs related to wound healing as evidenced by est needs  GOAL:  Pt to meet 100% of their estimated protein needs needs      MONITOR:  Po intake, labs, skin assessments and wt trends     REASON FOR ASSESSMENT: Wound healing consult     ASSESSMENT: Patient is a 89 yoi male with hx of DM type 2 (A1C 7.0%). He had amputation of right great toe March 2016. He has had recurrent infection of the same right foot. He says "I pushed myself too much".  His appetite is excellent and his diabetes fairly well controlled. Based on his diet hx he usually skips breakfast and then eats hot dog or hamburger for lunch and taco's, hamburger steak and veggies for dinner. He Is a self reported big beef eater. We reviewed his increased protein needs due to his wound and the importance of consuming protein at consistent intervals with a goal of 4 times daily. Recommended he also take a multivitamin daily due to the lack of variety in his diet.    Recent Labs Lab 09/13/16 1406  NA 136  K 4.1  CL 101  CO2 26  BUN 23*  CREATININE 0.88  CALCIUM 8.7*  GLUCOSE 149*  labs:   Diet Order:  Diet Carb Modified Fluid consistency: Thin; Room service appropriate? Yes Diet NPO time specified Except for: Sips with Meds  Skin:   pt had right great toe amputated 12/24/2014 and now has cellulitis to "ball" of right foot per pt  Last BM:   11/27   Height:   Ht Readings from Last 1 Encounters:  09/13/16 6\' 3"  (1.905 m)    Weight:   Wt Readings from Last 1 Encounters:  09/13/16 260 lb (117.9 kg)    Ideal Body Weight:   89 kg  BMI:  Body mass index is 32.5 kg/m. obesity class I   Estimated Nutritional Needs:   Kcal:   2225-2492  Protein:   130-135 gr  Fluid:   2.2-2.5 liters daily  EDUCATION NEEDS:  Addressed related to  increased protein needs for wound healing   Colman Cater MS,RD,CSG,LDN Office: [phone] Pager: [phone]

## 2016-09-13 NOTE — H&P (Signed)
History and Physical  [name] [id number] DOB: September 01, 1956 DOA: 09/13/2016  PCP: Sallee Lange, MD  Patient coming from: home  Chief Complaint: right foot pain  HPI:  60 year old man PMH diabetes mellitus type 2 with peripheral vascular disease, status post first toe amputation in the past, seen in the office by his podiatrist today for recurrent right foot infection. Dr. Caprice Beaver requested direct admission to the hospital for IV antibiotics, MRI.  Patient reports he's had a ulcer on his right foot near the site of previous amputation of the great toe for months now. Approximate 2 weeks ago it started draining and he was treated with antibiotics by his podiatrist. He had improvement with antibiotics but dropped something on it 11/26 in the evening, the foot became red, painful. Pain is helped by pain medication. He was seen in the office today and referred for direct admission.  Review of Systems:  Negative for new visual changes, sore throat, rash, chest pain, SOB, dysuria, bleeding, n/v/abdominal pain.  Positive for fever and sweats  Past Medical History:  Diagnosis Date  . CAD (coronary artery disease)    status post PCI and bare metal stenting of   the proximal right coronary artery with placement of a Driver bare   metal stent.   . CHF (congestive heart failure) ([place])   . Depression   . Diabetes mellitus   . DJD (degenerative joint disease)   . First degree AV block    with intermittent second degree types I and    II heart block.   . Gout   . Heart attack 2008  . Heart murmur 1974  . HTN (hypertension)   . Hypercholesterolemia   . Hyperlipidemia   . Myocardial infarction   . Obesity   . Oral candidiasis   . Peripheral vascular disease [facility])     Past Surgical History:  Procedure Laterality Date  . AMPUTATION Right 01/12/2015   Procedure: PARTIAL AMPUTATION 1ST RAY RIGHT FOOT (Amputation of right great toe and portion of the first metatarsal bone right foot);   Surgeon: Caprice Beaver, DPM;  Location: [facility];  Service: Podiatry;  Laterality: Right;  . COLONOSCOPY  08/2008  . ESOPHAGOGASTRODUODENOSCOPY  08/2008  . LAMINOTOMY  2001, 2006     Right L4-5 interlaminar laminotomy with excision of herniated   disc with operating microscope.  Marland Kitchen NECK SURGERY     cervical disc  . ORIF WRIST FRACTURE Right   . PERIPHERAL VASCULAR CATHETERIZATION N/A 02/27/2015   Procedure: Abdominal Aortogram;  Surgeon: Elam Dutch, MD;  Location: [place] [facility];  Service: Cardiovascular;  Laterality: N/A;  . PERIPHERAL VASCULAR CATHETERIZATION Bilateral 02/27/2015   Procedure: Lower Extremity Angiography;  Surgeon: Elam Dutch, MD;  Location: [place] [facility];  Service: Cardiovascular;  Laterality: Bilateral;  . Right ankle fracture open reduction internal fixation.  2003  . WOUND DEBRIDEMENT Right 12/24/2014   Procedure: DEBRIDEMENT SOFT TISSUE AND BONE RIGHT FOOT;  Surgeon: Caprice Beaver, DPM;  Location: [facility];  Service: Podiatry;  Laterality: Right;     reports that he quit smoking about 45 years ago. His smoking use included Cigarettes. He has a 10.00 pack-year smoking history. He has never used smokeless tobacco. He reports that he uses drugs, including Marijuana. He reports that he does not drink alcohol. Ambulatory  Allergies  Allergen Reactions  . Metformin And Related     GI symptoms  . Statins Other (See Comments)    Legs ache, he states  Crestor cause shoulder pain  . Vicodin [Hydrocodone-Acetaminophen] Nausea Only    Family History  Problem Relation Age of Onset  . Cancer Mother   . Cancer Father   . Diabetes Father   . Cancer Sister   . Diabetes Sister   . Heart disease Sister     before age 59     Prior to Admission medications   Medication Sig Start Date End Date Taking? Authorizing Provider  aspirin EC 81 MG tablet Take 81 mg by mouth daily.   Yes Historical Provider, MD  B-D ULTRAFINE III SHORT PEN 31G X 8 MM MISC USE  PEN NEEDLES DAILY WITH LANTUS 11/04/15  Yes Kathyrn Drown, MD  halobetasol (ULTRAVATE) 0.05 % ointment Apply topically 2 (two) times daily. 06/09/14  Yes Kathyrn Drown, MD  Insulin Detemir (LEVEMIR) 100 UNIT/ML Pen May titrate up to 50 units into skin at bedtime for diabetes 02/22/16  Yes Kathyrn Drown, MD  JARDIANCE 25 MG TABS tablet TAKE 1 TABLET EVERY DAY 09/07/16  Yes Kathyrn Drown, MD  levofloxacin (LEVAQUIN) 500 MG tablet  08/23/16  Yes Historical Provider, MD  oxyCODONE-acetaminophen (PERCOCET) 10-325 MG tablet Take 1 tablet by mouth every 4 (four) hours as needed for pain. As needed for pain 08/24/16  Yes Kathyrn Drown, MD  rosuvastatin (CRESTOR) 5 MG tablet Take 1 tablet (5 mg total) by mouth at bedtime. 08/18/16  Yes Kathyrn Drown, MD    Physical Exam: Vitals:   09/13/16 1156  Pulse: 95  Temp: 98.8 F (37.1 C)  TempSrc: Oral  SpO2: 100%  Weight: 117.9 kg (260 lb)  Height: 6\' 3"  (1.905 m)    Constitutional:  . Appears calm and comfortable Eyes:  . PERRL and irises appear normal . Normal conjunctivae and lids ENMT:  . nose appears normal . grossly normal hearing Neck:  . neck appears normal, no masses . no thyromegaly Respiratory:  . CTA bilaterally, no w/r/r.  . Respiratory effort normal. No retractions or accessory muscle use Cardiovascular:  . RRR, no m/r/g . No LLE extremity edema   Abdomen:  . Abdomen appears normal; no tenderness or masses Musculoskeletal:  . Digits/nails: no clubbing, cyanosis, petechiae  Skin:  . Right foot bandaged. . palpation of skin: no induration or nodules Neurologic:  . Grossly normal Psychiatric:  . judgement and insight appear normal . Mental status o Mood, affect appropriate  Wt Readings from Last 3 Encounters:  09/13/16 117.9 kg (260 lb)  08/24/16 115.9 kg (255 lb 8 oz)  05/24/16 115.3 kg (254 lb 3.2 oz)    I have personally reviewed following labs and imaging studies  Labs on Admission:  CBC: No results for  input(s): WBC, NEUTROABS, HGB, HCT, MCV, PLT in the last 168 hours. Basic Metabolic Panel: No results for input(s): NA, K, CL, CO2, GLUCOSE, BUN, CREATININE, CALCIUM, MG, PHOS in the last 168 hours.    Principal Problem:   Diabetic foot infection ([place]) Active Problems:   Peripheral vascular disease ([place])   Hypercholesterolemia   Diabetes mellitus ([place])   Assessment/Plan 1. Right diabetic foot ulcer with cellulitis. Worsening despite outpatient treatment with antibiotics. Per Dr. Caprice Beaver, plan MRI and antibiotics. 2. Diabetes mellitus type 2 with peripheral vascular disease, appears stable. 3. Hyperlipidemia, hypertension, coronary artery disease, appears stable 4. Chronic back pain, osteoarthritis; appears stable.   Admit, IV antibiotics, MRI to rule out osteomyelitis, podiatry consultation  Continue long-acting insulin. Sliding scale insulin.  DVT prophylaxis:SCDs Code Status: full  code Family Communication: wife  It is my clinical opinion that admission to INPATIENT is reasonable and necessary in this patient . presenting with symptoms of foot infection, concerning for diabetic foot infection, possible osteomyelitis   . in the context of PMH including: DM  . Workup and treatment include MRI, podiatry consultation, IV antibiotics.   Given the aforementioned, the predictability of an adverse outcome is felt to be significant. I expect that the patient will require at least 2 midnights in the hospital to treat this condition.   Time spent: 50 minutes  Murray Hodgkins, MD  [facility] Direct contact: [phone] --[place] [place]  --[email or web address]; password [email or web address]  7PM-7AM contact night coverage as above  09/13/2016, 1:03 PM

## 2016-09-13 NOTE — [place] Notes (Signed)
Inpatient Diabetes Program Recommendations  AACE/ADA: New Consensus Statement on Inpatient Glycemic Control (2015)  Target Ranges:  Prepandial:   less than 140 mg/dL      Peak postprandial:   less than 180 mg/dL (1-2 hours)      Critically ill patients:  140 - 180 mg/dL  Results for [name], [name] (MRN [id number]) as of 09/13/2016 13:21  Ref. Range 08/24/2016 08:48  Hemoglobin A1C Unknown 7.0   Review of Glycemic Control  Diabetes history: DM2 Outpatient Diabetes medications: Levemir 48 units QHS, Jardiance 25 mg daily Current orders for Inpatient glycemic control: Levemir 48 units QHS  Inpatient Diabetes Program Recommendations: Correction ([facility]I): While inpatient, please consider ordering CBGS with Novolog correction scale ACHS. HgbA1C: Per chart, last A1C was 7% on 08/24/16 indicating an average glucose of 154 mg/dl over the past 2-3 months.  NOTE: Spoke with patient over phone about diabetes and home regimen for diabetes control. Patient reports that he is followed by PCP for diabetes management and currently he takes Levemir 48 units QHS and Jardiance 25 mg daily as an outpatient for diabetes control. Patient reports that he is taking DM medications as prescribed and that he last seen his PCP 08/24/16.  Patient states that he usually checks his glucose 1 time per day and that it is usually in the 100's mg/dl.  Patient states that he does not skip any insulin and that he does not follow a strict diet but reports that he does not eat many sweets and that he is drinking diet sodas and unsweetened tea. Patient inquired about cost of Levemir and his pain medications that he will be receiving while inpatient. Informed patient medication cost while inpatient was not something the diabetes coordinator dealt with but let patient know I would discuss with Case Manager and bedside RN to see if they could provide more information.  Patient verbalized understanding of information discussed and he states that  he has no further questions at this time related to diabetes. Crist Fat, RN CM and asked if she could assist patient with more information on how medications cost are charged for inpatients. Also talked with [place], RN to inform of patient's questions about cost of inpatient medications.  Thanks, Barnie Alderman, RN, MSN, CDE Diabetes Coordinator Inpatient Diabetes Program [phone] (Team Pager)

## 2016-09-13 NOTE — Consult Note (Signed)
Podiatry Consult Note  Reason for Consultation:  Diabetic foot infection.  History of Present Illness: [name] is a 60 y.o. male referred for admission from my office earlier today.  He first present with a new ulceration along the plantar aspect of the right second metatarsal head on 08/19/2016.  Radiographs were performed.  No erosive changes were noted.  Labs were ordered.  A debridement was performed.  A culture was obtained.  He was empirically placed on doxycycline.  A collagen with silver dressing was applied to the ulceration.  The patient was instructed to change the dressing daily.  He was instructed to wear a surgical shoe.    Labs revealed:  WBC of 9.8, neutrophils 6.3 and ESR of 10.  CRP was elevated at 18.8. Culture grew out Serratia fonticola resistent to doxycycline but sensitive to Levaquin.  He was placed on Levaquin 500 mg 1 tablet daily on 08/23/2016.  He contacted the [facility] office yesterday complaining of pain.  He was started on a second round of Levaquin and advised to follow-up in [place] today.     Past Medical History:  Diagnosis Date  . CAD (coronary artery disease)    status post PCI and bare metal stenting of   the proximal right coronary artery with placement of a Driver bare   metal stent.   . CHF (congestive heart failure) ([place])   . Depression   . Diabetes mellitus   . DJD (degenerative joint disease)   . First degree AV block    with intermittent second degree types I and    II heart block.   . Gout   . Heart attack 2008  . Heart murmur 1974  . HTN (hypertension)   . Hypercholesterolemia   . Hyperlipidemia   . Myocardial infarction   . Obesity   . Oral candidiasis   . Peripheral vascular disease ([facility])    Scheduled Meds: . aspirin EC  81 mg Oral Daily  . cefTRIAXone (ROCEPHIN)  IV  2 g Intravenous Q24H   And  . metroNIDAZOLE  500 mg Oral Q8H  . feeding supplement (PRO-STAT SUGAR FREE 64)  30 mL Oral TID  . insulin detemir  48 Units  Subcutaneous Q2200  . multivitamin with minerals  1 tablet Oral Daily  . rosuvastatin  5 mg Oral QHS  . sodium chloride flush  3 mL Intravenous Q12H  . sodium chloride flush  3 mL Intravenous Q12H   Continuous Infusions: PRN Meds:.sodium chloride, acetaminophen **OR** acetaminophen, ondansetron **OR** ondansetron (ZOFRAN) IV, oxyCODONE-acetaminophen **AND** oxyCODONE, sodium chloride flush  Allergies  Allergen Reactions  . Metformin And Related     GI symptoms  . Statins Other (See Comments)    Legs ache, he states Crestor cause shoulder pain  . Vicodin [Hydrocodone-Acetaminophen] Nausea Only   Past Surgical History:  Procedure Laterality Date  . AMPUTATION Right 01/12/2015   Procedure: PARTIAL AMPUTATION 1ST RAY RIGHT FOOT (Amputation of right great toe and portion of the first metatarsal bone right foot);  Surgeon: Caprice Beaver, DPM;  Location: [facility];  Service: Podiatry;  Laterality: Right;  . COLONOSCOPY  08/2008  . ESOPHAGOGASTRODUODENOSCOPY  08/2008  . LAMINOTOMY  2001, 2006     Right L4-5 interlaminar laminotomy with excision of herniated   disc with operating microscope.  Marland Kitchen NECK SURGERY     cervical disc  . ORIF WRIST FRACTURE Right   . PERIPHERAL VASCULAR CATHETERIZATION N/A 02/27/2015   Procedure: Abdominal Aortogram;  Surgeon: Jessy Oto  Fields, MD;  Location: [place] [facility];  Service: Cardiovascular;  Laterality: N/A;  . PERIPHERAL VASCULAR CATHETERIZATION Bilateral 02/27/2015   Procedure: Lower Extremity Angiography;  Surgeon: Elam Dutch, MD;  Location: [place] [facility];  Service: Cardiovascular;  Laterality: Bilateral;  . Right ankle fracture open reduction internal fixation.  2003  . WOUND DEBRIDEMENT Right 12/24/2014   Procedure: DEBRIDEMENT SOFT TISSUE AND BONE RIGHT FOOT;  Surgeon: Caprice Beaver, DPM;  Location: [facility];  Service: Podiatry;  Laterality: Right;   Family History  Problem Relation Age of Onset  . Cancer Mother   . Cancer Father   .  Diabetes Father   . Cancer Sister   . Diabetes Sister   . Heart disease Sister     before age 64   Social History:  reports that he quit smoking about 45 years ago. His smoking use included Cigarettes. He has a 10.00 pack-year smoking history. He has never used smokeless tobacco. He reports that he uses drugs, including Marijuana. He reports that he does not drink alcohol.  Review of Systems: He denies nausea, vomiting or chills.  He complains of right foot pain.  Physical Examination: Vital signs in last 24 hours:   Temp:  [98.8 F (37.1 C)-99.5 F (37.5 C)] 99.5 F (37.5 C) (11/28 1425) Pulse Rate:  [92-95] 92 (11/28 1425) Resp:  [18] 18 (11/28 1425) BP: (133-134)/(74-78) 133/74 (11/28 1425) SpO2:  [100 %] 100 % (11/28 1425) Weight:  [260 lb (117.9 kg)] 260 lb (117.9 kg) (11/28 1156)  A full-thickness ulceration is present along the plantar aspect of the right second metatarsal head.  The ulceration measures 1.1 cm in length by 0.6 cm in width by 0.5 cm in depth.  Purulence was expressed from the ulceration.  I was unable to palpate bone.  The skin surrounding the ulceration is warm and red.  The second toe is edematous.  There is edema of the forefoot most pronounced surrounding the second MPJ.  Dorsalis pedis pulse is palpable but diminished.  Posterior tibial pulses palpable but diminished.  He is status post partial first ray amputation of the right foot.  Lab/Test Results:   Recent Labs  09/13/16 1406  WBC 11.4*  HGB 13.2  HCT 39.6  PLT 231  NA 136  K 4.1  CL 101  CO2 26  BUN 23*  CREATININE 0.88  GLUCOSE 149*  CALCIUM 8.7*    No results found for this or any previous visit (from the past 240 hour(s)).   Mr Foot Right Wo Contrast  Result Date: 09/13/2016 CLINICAL DATA:  Diabetic foot infection. EXAM: MRI OF THE RIGHT FOREFOOT WITHOUT CONTRAST TECHNIQUE: Multiplanar, multisequence MR imaging of the right foot was performed. No intravenous contrast was  administered. COMPARISON:  Radiographs 01/12/2015 FINDINGS: Prior partial first ray resection with amputation at the base of the first metatarsal. Abnormal marrow signal and distal aspect of the second metatarsal and also in the second proximal phalanx suspicious for septic arthritis at the metatarsal phalangeal joint and osteomyelitis. There is also soft tissue swelling/ edema and fluid surrounding the second metatarsal head which is worrisome for abscess. Abnormal T1 and T2 marrow signal in the fifth proximal phalanx could suggest osteomyelitis. Diffuse cellulitis and myofasciitis. No significant midfoot changes. IMPRESSION: 1. Surgical changes from a prior first ray resection. 2. MR findings consistent with septic arthritis at the second metatarsal phalangeal joint with osteomyelitis involving the second metatarsal and second proximal phalanx. 3. Abnormal marrow signal in  the fifth proximal phalanx could reflect osteomyelitis. 4. Fluid collections surrounding the second metatarsal are suspicious for abscess. 5. Diffuse cellulitis and myofasciitis. Electronically Signed   By: Marijo Sanes M.D.   On: 09/13/2016 14:06    Assessment: 1.  Cellulitis, right foot. 2.  Osteomyelitis of the second ray, right foot. 3.  Septic second metatarsophalangeal joint, right foot. 4.  Abscess, right foot. 5.  Diabetes mellitus with peripheral neuropathy. 6.  Peripheral vascular disease.  Plan: The clinical findings, assessment and plan were explained.  The results of the MRI were explained to the patient.  I recommended a partial second ray amputation of the right foot.  The benefits and risk of this procedure were explained.  He agreed with this course of care.  I explained to him that the surgical wound could be slow to heal because of his diabetes and peripheral vascular disease.  If the surgical wound does not progress well then he will be referred to vascular specialist for evaluation.  I recommended proceeding  with the partial second ray amputation first given the infection and his worsening appearance of his foot.  He understands this and agrees with this course of care.  Surgery is planned for tomorrow.  He has been placed NPO after midnight.  An order to obtain consent has been placed on the patient's chart.  Nyaire Denbleyker IVAN 09/13/2016, 5:35 PM

## 2016-09-14 ENCOUNTER — Inpatient Hospital Stay (HOSPITAL_COMMUNITY): Payer: BLUE CROSS/BLUE SHIELD

## 2016-09-14 ENCOUNTER — Inpatient Hospital Stay (HOSPITAL_COMMUNITY): Payer: BLUE CROSS/BLUE SHIELD | Admitting: Anesthesiology

## 2016-09-14 ENCOUNTER — Encounter (HOSPITAL_COMMUNITY): Payer: Self-pay

## 2016-09-14 ENCOUNTER — Encounter (HOSPITAL_COMMUNITY)
Admission: AD | Disposition: A | Discharge status: Home Health | Payer: Self-pay | Source: Ambulatory Visit | Attending: Family Medicine

## 2016-09-14 HISTORY — PX: AMPUTATION: SHX166

## 2016-09-14 LAB — HIV ANTIBODY (ROUTINE TESTING W REFLEX): HIV SCREEN 4TH GENERATION: NONREACTIVE

## 2016-09-14 LAB — HEMOGLOBIN A1C
Hgb A1c MFr Bld: 7.8 % — ABNORMAL HIGH (ref 4.8–5.6)
Mean Plasma Glucose: 177 mg/dL

## 2016-09-14 LAB — GLUCOSE, CAPILLARY
GLUCOSE-CAPILLARY: 89 mg/dL (ref 65–99)
GLUCOSE-CAPILLARY: 69 mg/dL (ref 65–99)

## 2016-09-14 SURGERY — AMPUTATION, FOOT, RAY
Anesthesia: Monitor Anesthesia Care | Laterality: Right

## 2016-09-14 MED ORDER — LIDOCAINE HCL (PF) 1 % IJ SOLN
INTRAMUSCULAR | Status: DC | PRN
  Administered 2016-09-14: 20 mL

## 2016-09-14 MED ORDER — FENTANYL CITRATE (PF) 100 MCG/2ML IJ SOLN
25.0000 ug | INTRAMUSCULAR | Status: AC | PRN
  Administered 2016-09-14 (×2): 25 ug via INTRAVENOUS

## 2016-09-14 MED ORDER — MIDAZOLAM HCL 2 MG/2ML IJ SOLN
INTRAMUSCULAR | Status: AC
  Filled 2016-09-14: qty 2

## 2016-09-14 MED ORDER — FENTANYL CITRATE (PF) 100 MCG/2ML IJ SOLN
INTRAMUSCULAR | Status: AC
  Filled 2016-09-14: qty 2

## 2016-09-14 MED ORDER — MIDAZOLAM HCL 2 MG/2ML IJ SOLN
1.0000 mg | INTRAMUSCULAR | Status: DC | PRN
  Administered 2016-09-14 (×2): 2 mg via INTRAVENOUS
  Filled 2016-09-14: qty 2

## 2016-09-14 MED ORDER — 0.9 % SODIUM CHLORIDE (POUR BTL) OPTIME
TOPICAL | Status: DC | PRN
  Administered 2016-09-14: 1000 mL

## 2016-09-14 MED ORDER — MIDAZOLAM HCL 5 MG/5ML IJ SOLN
INTRAMUSCULAR | Status: DC | PRN
  Administered 2016-09-14: 2 mg via INTRAVENOUS

## 2016-09-14 MED ORDER — BUPIVACAINE HCL (PF) 0.5 % IJ SOLN
INTRAMUSCULAR | Status: DC | PRN
  Administered 2016-09-14: 20 mL

## 2016-09-14 MED ORDER — LIDOCAINE HCL (PF) 1 % IJ SOLN
INTRAMUSCULAR | Status: AC
  Filled 2016-09-14: qty 30

## 2016-09-14 MED ORDER — LACTATED RINGERS IV SOLN
INTRAVENOUS | Status: DC
  Administered 2016-09-14: 15:00:00 via INTRAVENOUS

## 2016-09-14 MED ORDER — BUPIVACAINE HCL (PF) 0.5 % IJ SOLN
INTRAMUSCULAR | Status: AC
  Filled 2016-09-14: qty 30

## 2016-09-14 MED ORDER — FENTANYL CITRATE (PF) 100 MCG/2ML IJ SOLN: 25.0000 ug | INTRAMUSCULAR | Status: DC | PRN

## 2016-09-14 MED ORDER — PROPOFOL 500 MG/50ML IV EMUL
INTRAVENOUS | Status: DC | PRN
  Administered 2016-09-14: 16:00:00 via INTRAVENOUS
  Administered 2016-09-14: 150 ug/kg/min via INTRAVENOUS

## 2016-09-14 MED ORDER — FENTANYL CITRATE (PF) 100 MCG/2ML IJ SOLN
INTRAMUSCULAR | Status: DC | PRN
  Administered 2016-09-14: 25 ug via INTRAVENOUS

## 2016-09-14 MED ORDER — PROPOFOL 10 MG/ML IV BOLUS
INTRAVENOUS | Status: AC
  Filled 2016-09-14: qty 20

## 2016-09-14 SURGICAL SUPPLY — 65 items
APL SKNCLS STERI-STRIP NONHPOA (GAUZE/BANDAGES/DRESSINGS)
BAG HAMPER (MISCELLANEOUS) ×2 IMPLANT
BANDAGE ACE 4 STERILE (GAUZE/BANDAGES/DRESSINGS) ×1 IMPLANT
BANDAGE ELASTIC 4 VELCRO NS (GAUZE/BANDAGES/DRESSINGS) ×2 IMPLANT
BANDAGE ESMARK 4X12 BL STRL LF (DISPOSABLE) ×1 IMPLANT
BENZOIN TINCTURE PRP APPL 2/3 (GAUZE/BANDAGES/DRESSINGS) ×1 IMPLANT
BLADE AVERAGE 25X9 (BLADE) ×2 IMPLANT
BLADE SURG 15 STRL LF DISP TIS (BLADE) ×1 IMPLANT
BLADE SURG 15 STRL SS (BLADE) ×2
BNDG CMPR 12X4 ELC STRL LF (DISPOSABLE) ×1
BNDG CONFORM 2 STRL LF (GAUZE/BANDAGES/DRESSINGS) ×2 IMPLANT
BNDG ESMARK 4X12 BLUE STRL LF (DISPOSABLE) ×2
BNDG GAUZE ELAST 4 BULKY (GAUZE/BANDAGES/DRESSINGS) ×2 IMPLANT
BNDG GAUZE ROLL STR 2.25X3YD (GAUZE/BANDAGES/DRESSINGS) ×2 IMPLANT
BNDG GZE SM 3X2.25 6 PLY (GAUZE/BANDAGES/DRESSINGS) ×2
BOOT STEPPER DURA LG (SOFTGOODS) IMPLANT
BOOT STEPPER DURA MED (SOFTGOODS) IMPLANT
BOOT STEPPER DURA SM (SOFTGOODS) IMPLANT
BOOT STEPPER DURA XLG (SOFTGOODS) IMPLANT
CLOTH BEACON ORANGE TIMEOUT ST (SAFETY) ×2 IMPLANT
COVER LIGHT HANDLE STERIS (MISCELLANEOUS) ×4 IMPLANT
CUFF TOURNIQUET SINGLE 18IN (TOURNIQUET CUFF) ×1 IMPLANT
DECANTER SPIKE VIAL GLASS SM (MISCELLANEOUS) ×3 IMPLANT
DRAPE OEC MINIVIEW 54X84 (DRAPES) ×2 IMPLANT
ELECT REM PT RETURN 9FT ADLT (ELECTROSURGICAL) ×2
ELECTRODE REM PT RTRN 9FT ADLT (ELECTROSURGICAL) ×1 IMPLANT
FORMALIN 10 PREFIL 120ML (MISCELLANEOUS) ×2 IMPLANT
GAUZE PACKING 1 X5 YD ST (GAUZE/BANDAGES/DRESSINGS) ×1 IMPLANT
GAUZE PACKING IODOFORM 1X5 (MISCELLANEOUS) ×1 IMPLANT
GAUZE SPONGE 4X4 12PLY STRL (GAUZE/BANDAGES/DRESSINGS) ×2 IMPLANT
GLOVE BIO SURGEON STRL SZ 6.5 (GLOVE) ×2 IMPLANT
GLOVE BIO SURGEON STRL SZ7 (GLOVE) ×2 IMPLANT
GLOVE BIO SURGEON STRL SZ7.5 (GLOVE) ×2 IMPLANT
GLOVE BIOGEL PI IND STRL 6.5 (GLOVE) IMPLANT
GLOVE BIOGEL PI IND STRL 7.0 (GLOVE) ×1 IMPLANT
GLOVE BIOGEL PI INDICATOR 6.5 (GLOVE) ×2
GLOVE BIOGEL PI INDICATOR 7.0 (GLOVE) ×1
GLOVE INDICATOR 6.5 STRL GRN (GLOVE) ×2 IMPLANT
GLOVE INDICATOR 7.0 STRL GRN (GLOVE) ×2 IMPLANT
GLOVE SURG SS PI 6.5 STRL IVOR (GLOVE) ×2 IMPLANT
GOWN STRL REUS W/TWL LRG LVL3 (GOWN DISPOSABLE) ×4 IMPLANT
KIT ROOM TURNOVER APOR (KITS) ×2 IMPLANT
MARKER SKIN DUAL TIP RULER LAB (MISCELLANEOUS) ×2 IMPLANT
NDL HYPO 25X1 1.5 SAFETY (NEEDLE) IMPLANT
NDL HYPO 27GX1-1/4 (NEEDLE) ×4 IMPLANT
NEEDLE HYPO 25X1 1.5 SAFETY (NEEDLE) ×2 IMPLANT
NEEDLE HYPO 27GX1-1/4 (NEEDLE) ×8 IMPLANT
NS IRRIG 1000ML POUR BTL (IV SOLUTION) ×2 IMPLANT
PACK BASIC LIMB (CUSTOM PROCEDURE TRAY) ×2 IMPLANT
PAD ABD 5X9 TENDERSORB (GAUZE/BANDAGES/DRESSINGS) ×1 IMPLANT
PAD ARMBOARD 7.5X6 YLW CONV (MISCELLANEOUS) ×2 IMPLANT
PAD TELFA 3X4 1S STER (GAUZE/BANDAGES/DRESSINGS) ×2 IMPLANT
RASP SM TEAR CROSS CUT (RASP) IMPLANT
SET BASIN LINEN APH (SET/KITS/TRAYS/PACK) ×2 IMPLANT
SOL PREP PROV IODINE SCRUB 4OZ (MISCELLANEOUS) ×2 IMPLANT
SPONGE GAUZE 4X4 12PLY STER LF (GAUZE/BANDAGES/DRESSINGS) ×1 IMPLANT
SPONGE LAP 18X18 X RAY DECT (DISPOSABLE) ×2 IMPLANT
STRIP CLOSURE SKIN 1/2X4 (GAUZE/BANDAGES/DRESSINGS) ×2 IMPLANT
SUT BONE WAX W31G (SUTURE) IMPLANT
SUT ETHILON 3 0 PS 1 (SUTURE) ×2 IMPLANT
SUT ETHILON 4 0 PS 2 18 (SUTURE) ×2 IMPLANT
SUT PROLENE 4 0 PS 2 18 (SUTURE) IMPLANT
SUT VIC AB 4-0 PS2 27 (SUTURE) IMPLANT
SYR CONTROL 10ML LL (SYRINGE) ×5 IMPLANT
TOWEL OR 17X26 4PK STRL BLUE (TOWEL DISPOSABLE) ×2 IMPLANT

## 2016-09-14 NOTE — Clinical Social Work Note (Signed)
CSW received consult for medication assistance. CM notified. CSW will sign off.  Benay Pike, [place]

## 2016-09-14 NOTE — Anesthesia Postprocedure Evaluation (Signed)
Anesthesia Post Note  Patient: [name]  Procedure(s) Performed: Procedure(s) (LRB): PARTIAL AMPUTATION RAY 2ND TOE RIGHT FOOT (Right)  Patient location during evaluation: PACU Anesthesia Type: MAC Level of consciousness: awake and alert and oriented Pain management: pain level controlled Vital Signs Assessment: post-procedure vital signs reviewed and stable Respiratory status: spontaneous breathing Cardiovascular status: stable Postop Assessment: no signs of nausea or vomiting Anesthetic complications: no    Last Vitals:  Vitals:   09/14/16 1435 09/14/16 1440  BP:  127/80  Pulse:    Resp: 16 18  Temp:      Last Pain:  Vitals:   09/14/16 1329  TempSrc: Oral  PainSc: 10-Worst pain ever                 [name]

## 2016-09-14 NOTE — [place] Notes (Signed)
Inpatient Diabetes Program Recommendations  AACE/ADA: New Consensus Statement on Inpatient Glycemic Control (2015)  Target Ranges:  Prepandial:   less than 140 mg/dL      Peak postprandial:   less than 180 mg/dL (1-2 hours)      Critically ill patients:  140 - 180 mg/dL    Review of Glycemic Control  Diabetes history: DM2 Outpatient Diabetes medications: Levemir 48 units QHS, Jardiance 25 mg daily Current orders for Inpatient glycemic control: Levemir 48 units QHS  Inpatient Diabetes Program Recommendations: Correction (SSI): While inpatient, please consider ordering CBGS with Novolog correction scale Q4H (while NPO and change to ACHS once diet resumed).  Thanks, Barnie Alderman, RN, MSN, CDE Diabetes Coordinator Inpatient Diabetes Program [phone] (Team Pager from 8am to 5pm)

## 2016-09-14 NOTE — Op Note (Signed)
OPERATIVE NOTE  DATE OF PROCEDURE 09/14/2016  SURGEON Marcheta Grammes, DPM  ASSISTANT SURGEON Jilda Panda, DPM  OR STAFF Circulator: Frederick Peers, RN Relief Circulator: Cipriano Bunker, RN Scrub Person: Royanne Foots   PREOPERATIVE DIAGNOSIS 1.  Osteomyelitis of the 2nd toe, right foot. 2.  Septic joint of the 2nd metatarsophalangeal joint, right foot  POSTOPERATIVE DIAGNOSIS Same  PROCEDURE Partial 2nd ray amputation, right foot  ANESTHESIA Monitor Anesthesia Care   HEMOSTASIS Pneumatic ankle tourniquet applied but not inflated  ESTIMATED BLOOD LOSS 30 cc  MATERIALS USED None  INJECTABLES 0.5% Marcaine plain 1% Lidocaine plain  PATHOLOGY 2nd toe with distal portion of the 2nd metatarsal, right foot Aerobic culture Anaerobic culture  COMPLICATIONS None  INDICATIONS:  Nonhealing ulceration of the right foot with radiographic and MRI findings consistent with osteomyelitis.  DESCRIPTION OF THE PROCEDURE:  The patient was brought to the operating room and placed on the operative table in the supine position.  A pneumatic ankle tourniquet was applied to the operative extremity.  Following sedation, the surgical site was anesthetized with 0.5% Marcaine plain.  The foot was then prepped, scrubbed, and draped in the usual sterile technique.    Attention was directed to the right foot.  2 converging semielliptical incisions were made encompassing the second toe and plantar ulceration.  The preoperative block was augmented with 1% Xylocaine plain.  Dissection was continued deep down to the level of the second metatarsal phalangeal joint.  The joint was disarticulated and passed from the operative field.  A focal area of purulence was encountered lateral to the first metatarsal head and evacuated.  The head of the first metatarsal was soft, brown and fragmented.  A power bone saw was used to perform an osteotomy of the second metatarsal shaft.  The distal second  metatarsal was freed of all soft tissue attachments and passed from the operative field.  The second toe and second metatarsal were submitted to pathology as one specimen.  Aerobic and anaerobic cultures were performed.  Also the soft tissue was debrided to bleeding viable tissue.  The surgical wound was irrigated with copious amounts of sterile irrigant.  The subcutaneous structures were reapproximated using 4-0 Vicryl.  The skin was reapproximated using 3-0 Prolene.  The dorsal aspect of the incision was left open and packed with iodoform gauze.  A sterile dressing was applied to the right foot.  Capillary refill was brisk to all remaining digits of the right foot.  The patient tolerated the procedure well.  The patient was then transferred to PACU with vital signs stable and vascular status intact to all toes of the operative foot.

## 2016-09-14 NOTE — Transfer of Care (Signed)
Immediate Anesthesia Transfer of Care Note  Patient: [name]  Procedure(s) Performed: Procedure(s): PARTIAL AMPUTATION RAY 2ND TOE RIGHT FOOT (Right)  Patient Location: PACU  Anesthesia Type:MAC  Level of Consciousness: awake  Airway & Oxygen Therapy: Patient Spontanous Breathing  Post-op Assessment: Report given to RN  Post vital signs: Reviewed  Last Vitals:  Vitals:   09/14/16 1435 09/14/16 1440  BP:  127/80  Pulse:    Resp: 16 18  Temp:      Last Pain:  Vitals:   09/14/16 1329  TempSrc: Oral  PainSc: 10-Worst pain ever      Patients Stated Pain Goal: 6 ([id number])  Complications: No apparent anesthesia complications

## 2016-09-14 NOTE — OR Nursing (Signed)
Laney Tufo [phone] wife

## 2016-09-14 NOTE — Care Management Note (Addendum)
Case Management Note  Patient Details  Name: KEI SARWAR MRN: [id number] Date of Birth: [birth year]  Subjective/Objective:   Patient adm from home with cellulitis of right foot, osteo of second digit, right foot, septic joint of right foot. Plans for ray amputation today. Patient is ind with ADL's, has PCP, transportation and insurance with drug coverage. Has had HH in the past with [facility]. If needed would like to use them again.              Action/Plan: Will follow for needs post surgery.   Expected Discharge Date:  09/15/16               Expected Discharge Plan:  [place]  In-House Referral:  NA  Discharge planning Services  CM Consult  Post Acute Care Choice:    Choice offered to:     DME Arranged:    DME Agency:     HH Arranged:    HH Agency:     Status of Service:  In process, will continue to follow  If discussed at Long Length of Stay Meetings, dates discussed:    Additional Comments:  Adelisa Satterwhite, Chauncey Reading, RN 09/14/2016, 10:54 AM

## 2016-09-14 NOTE — Progress Notes (Signed)
[facility]  PROGRESS NOTE  [name] [id number] DOB: Jul 06, 1956 DOA: 09/13/2016 PCP: Sallee Lange, MD   Brief HPI:   *60 year old man PMH diabetes mellitus type 2 with peripheral vascular disease, status post first toe amputation in the past, seen in the office by his podiatrist today for recurrent right foot infection. Dr. Caprice Beaver requested direct admission to the hospital for IV antibiotics, MRI.    Subjective   Patient denies any complaints this morning.   Assessment/Plan:     1. Right diabetic foot ulcer with cellulitis- patient to go for amputation today. Continue  Flagyl, ceftriaxone Podiatry is following 2. Diabetes mellitus type 2-stable, continue Levemir 3. Hypertension-blood pressure stable    DVT prophylaxis: SCDs  Code Status: Full code  Family Communication: Discussed with patient, his daughter and wife at bedside   Disposition Plan: To be decided   Consultants:  Podiatry  Procedures:  None        Antibiotics:   Anti-infectives    Start     Dose/Rate Route Frequency Ordered Stop   09/13/16 1400  [MAR Hold]  metroNIDAZOLE (FLAGYL) tablet 500 mg     (MAR Hold since 09/14/16 1319)   500 mg Oral Every 8 hours 09/13/16 1253     09/13/16 1315  [MAR Hold]  cefTRIAXone (ROCEPHIN) 2 g in dextrose 5 % 50 mL IVPB     (MAR Hold since 09/14/16 1319)   2 g 100 mL/hr over 30 Minutes Intravenous Every 24 hours 09/13/16 1253         Objective   Vitals:   09/13/16 1425 09/13/16 2100 09/14/16 0521 09/14/16 1329  BP: 133/74 (!) 115/55 119/62 132/80  Pulse: 92 90 88 86  Resp: 18 20 20 20   Temp: 99.5 F (37.5 C) 99.2 F (37.3 C) 98.2 F (36.8 C) 98.3 F (36.8 C)  TempSrc: Oral Oral Oral Oral  SpO2: 100% 100% 100% 99%  Weight:      Height:        Intake/Output Summary (Last 24 hours) at 09/14/16 1425 Last data filed at 09/13/16 2212  Gross per 24 hour  Intake              243 ml  Output                0 ml  Net              243  ml   Filed Weights   09/13/16 1156  Weight: 117.9 kg (260 lb)     Physical Examination:  General exam: Appears calm and comfortable. Respiratory system: Clear to auscultation. Respiratory effort normal. Cardiovascular system:  RRR. No  murmurs, rubs, gallops. No pedal edema. GI system: Abdomen is nondistended, soft and nontender. No organomegaly.  Central nervous system. No focal neurological deficits. 5 x 5 power in all extremities. Skin: Right second toe noted to have erythema and mild swelling. Status post amputation of right big toe. Psychiatry: Alert, oriented x 3.Judgement and insight appear normal. Affect normal.    Data Reviewed: I have personally reviewed following labs and imaging studies  CBG:  Recent Labs Lab 09/13/16 1616 09/14/16 1336  GLUCAP 148* 89    CBC:  Recent Labs Lab 09/13/16 1406  WBC 11.4*  NEUTROABS 9.2*  HGB 13.2  HCT 39.6  MCV 86.7  PLT [id number]    Basic Metabolic Panel:  Recent Labs Lab 09/13/16 1406  NA 136  K 4.1  CL 101  CO2 26  GLUCOSE  149*  BUN 23*  CREATININE 0.88  CALCIUM 8.7*    Recent Results (from the past 240 hour(s))  Surgical pcr screen     Status: None   Collection Time: 09/13/16  6:18 PM  Result Value Ref Range Status   MRSA, PCR NEGATIVE NEGATIVE Final   Staphylococcus aureus NEGATIVE NEGATIVE Final    Comment:        The Xpert SA Assay (FDA approved for NASAL specimens in patients over 84 years of age), is one component of a comprehensive surveillance program.  Test performance has been validated by [facility] for patients greater than or equal to 71 year old. It is not intended to diagnose infection nor to guide or monitor treatment.      Liver Function Tests: No results for input(s): AST, ALT, ALKPHOS, BILITOT, PROT, ALBUMIN in the last 168 hours. No results for input(s): LIPASE, AMYLASE in the last 168 hours. No results for input(s): AMMONIA in the last 168 hours.  Cardiac Enzymes: No  results for input(s): CKTOTAL, CKMB, CKMBINDEX, TROPONINI in the last 168 hours. BNP (last 3 results) No results for input(s): BNP in the last 8760 hours.  ProBNP (last 3 results) No results for input(s): PROBNP in the last 8760 hours.    Studies: Mr Foot Right Wo Contrast  Result Date: 09/13/2016 CLINICAL DATA:  Diabetic foot infection. EXAM: MRI OF THE RIGHT FOREFOOT WITHOUT CONTRAST TECHNIQUE: Multiplanar, multisequence MR imaging of the right foot was performed. No intravenous contrast was administered. COMPARISON:  Radiographs 01/12/2015 FINDINGS: Prior partial first ray resection with amputation at the base of the first metatarsal. Abnormal marrow signal and distal aspect of the second metatarsal and also in the second proximal phalanx suspicious for septic arthritis at the metatarsal phalangeal joint and osteomyelitis. There is also soft tissue swelling/ edema and fluid surrounding the second metatarsal head which is worrisome for abscess. Abnormal T1 and T2 marrow signal in the fifth proximal phalanx could suggest osteomyelitis. Diffuse cellulitis and myofasciitis. No significant midfoot changes. IMPRESSION: 1. Surgical changes from a prior first ray resection. 2. MR findings consistent with septic arthritis at the second metatarsal phalangeal joint with osteomyelitis involving the second metatarsal and second proximal phalanx. 3. Abnormal marrow signal in the fifth proximal phalanx could reflect osteomyelitis. 4. Fluid collections surrounding the second metatarsal are suspicious for abscess. 5. Diffuse cellulitis and myofasciitis. Electronically Signed   By: Marijo Sanes M.D.   On: 09/13/2016 14:06    Scheduled Meds: . [MAR Hold] aspirin EC  81 mg Oral Daily  . [MAR Hold] cefTRIAXone (ROCEPHIN)  IV  2 g Intravenous Q24H   And  . [MAR Hold] metroNIDAZOLE  500 mg Oral Q8H  . [MAR Hold] feeding supplement (PRO-STAT SUGAR FREE 64)  30 mL Oral TID  . [MAR Hold] insulin detemir  48 Units  Subcutaneous Q2200  . [MAR Hold] multivitamin with minerals  1 tablet Oral Daily  . [MAR Hold] rosuvastatin  5 mg Oral QHS  . [MAR Hold] sodium chloride flush  3 mL Intravenous Q12H  . [MAR Hold] sodium chloride flush  3 mL Intravenous Q12H      Time spent: 25 min  [facility] Pager [phone]. If 7PM-7AM, please contact night-coverage at [email or web address], Office  [phone]  password [email or web address] 09/14/2016, 2:25 PM  LOS: 1 day

## 2016-09-14 NOTE — Anesthesia Preprocedure Evaluation (Signed)
Anesthesia Evaluation  Patient identified by MRN, date of birth, ID band Patient awake    Reviewed: Allergy & Precautions, NPO status , Patient's Chart, lab work & pertinent test results  Airway Mallampati: I  TM Distance: >3 FB     Dental  (+) Teeth Intact   Pulmonary former smoker,    breath sounds clear to auscultation       Cardiovascular hypertension, Pt. on medications + CAD, + Past MI, + Cardiac Stents, + Peripheral Vascular Disease and +CHF  + dysrhythmias  Rhythm:Regular Rate:Normal     Neuro/Psych PSYCHIATRIC DISORDERS Depression  Neuromuscular disease    GI/Hepatic neg GERD  ,  Endo/Other  diabetes, Type 2, Insulin DependentMorbid obesity  Renal/GU      Musculoskeletal  (+) Arthritis ,   Abdominal   Peds  Hematology   Anesthesia Other Findings   Reproductive/Obstetrics                             Anesthesia Physical Anesthesia Plan  ASA: III  Anesthesia Plan: MAC   Post-op Pain Management:    Induction: Intravenous  Airway Management Planned: Simple Face Mask  Additional Equipment:   Intra-op Plan:   Post-operative Plan:   Informed Consent: I have reviewed the patients History and Physical, chart, labs and discussed the procedure including the risks, benefits and alternatives for the proposed anesthesia with the patient or authorized representative who has indicated his/her understanding and acceptance.     Plan Discussed with:   Anesthesia Plan Comments:         Anesthesia Quick Evaluation

## 2016-09-14 NOTE — Brief Op Note (Signed)
BRIEF OPERATIVE NOTE  DATE OF PROCEDURE 09/14/2016  SURGEON Marcheta Grammes, DPM  ASSISTANT SURGEON Jilda Panda, DPM  OR STAFF Circulator: Frederick Peers, RN Relief Circulator: Cipriano Bunker, RN Scrub Person: Royanne Foots   PREOPERATIVE DIAGNOSIS 1.  Osteomyelitis of the 2nd toe, right foot. 2.  Septic joint of the 2nd metatarsophalangeal joint, right foot  POSTOPERATIVE DIAGNOSIS Same  PROCEDURE Partial 2nd ray amputation, right foot  ANESTHESIA Monitor Anesthesia Care   HEMOSTASIS Pneumatic ankle tourniquet applied but not inflated  ESTIMATED BLOOD LOSS 30 cc  MATERIALS USED None  INJECTABLES 0.5% Marcaine plain 1% Lidocaine plain  PATHOLOGY 2nd toe with distal portion of the 2nd metatarsal, right foot Aerobic culture Anaerobic culture  COMPLICATIONS None

## 2016-09-15 DIAGNOSIS — E7800 Pure hypercholesterolemia, unspecified: Secondary | ICD-10-CM

## 2016-09-15 DIAGNOSIS — Z794 Long term (current) use of insulin: Secondary | ICD-10-CM

## 2016-09-15 DIAGNOSIS — E1151 Type 2 diabetes mellitus with diabetic peripheral angiopathy without gangrene: Principal | ICD-10-CM

## 2016-09-15 LAB — BASIC METABOLIC PANEL
Anion gap: 7 (ref 5–15)
BUN: 19 mg/dL (ref 6–20)
CO2: 28 mmol/L (ref 22–32)
Calcium: 8.5 mg/dL — ABNORMAL LOW (ref 8.9–10.3)
Chloride: 101 mmol/L (ref 101–111)
Creatinine, Ser: 0.72 mg/dL (ref 0.61–1.24)
GFR calc Af Amer: >60 mL/min (ref >60)
Glucose, Bld: 108 mg/dL — ABNORMAL HIGH (ref 65–99)
POTASSIUM: 3.8 mmol/L (ref 3.5–5.1)
Sodium: 136 mmol/L (ref 135–145)

## 2016-09-15 LAB — CBC
HCT: 38.2 % — ABNORMAL LOW (ref 39.0–52.0)
Hemoglobin: 12.6 g/dL — ABNORMAL LOW (ref 13.0–17.0)
MCH: 28.4 pg (ref 26.0–34.0)
MCHC: 33 g/dL (ref 30.0–36.0)
MCV: 86.2 fL (ref 78.0–100.0)
Platelets: 232 10*3/uL (ref 150–400)
RBC: 4.43 MIL/uL (ref 4.22–5.81)
RDW: 13.1 % (ref 11.5–15.5)
WBC: 8.2 10*3/uL (ref 4.0–10.5)

## 2016-09-15 LAB — GLUCOSE, CAPILLARY: GLUCOSE-CAPILLARY: 156 mg/dL — AB (ref 65–99)

## 2016-09-15 MED ORDER — INSULIN DETEMIR 100 UNIT/ML ~~LOC~~ SOLN
40.0000 [IU] | Freq: Every day | SUBCUTANEOUS | Status: DC
  Administered 2016-09-15: 40 [IU] via SUBCUTANEOUS
  Filled 2016-09-15 (×4): qty 0.4

## 2016-09-15 MED ORDER — SENNOSIDES-DOCUSATE SODIUM 8.6-50 MG PO TABS
1.0000 | ORAL_TABLET | Freq: Two times a day (BID) | ORAL | Status: DC
  Administered 2016-09-15 – 2016-09-16 (×3): 1 via ORAL
  Filled 2016-09-15 (×3): qty 1

## 2016-09-15 NOTE — Anesthesia Postprocedure Evaluation (Signed)
Anesthesia Post Note  Patient: [name]  Procedure(s) Performed: Procedure(s) (LRB): PARTIAL AMPUTATION RAY 2ND TOE RIGHT FOOT (Right)  Patient location during evaluation: Nursing Unit Anesthesia Type: MAC Level of consciousness: awake and alert and oriented Pain management: pain level controlled (Receiving Percocet and Roxicet with good pain relief) Vital Signs Assessment: post-procedure vital signs reviewed and stable Respiratory status: spontaneous breathing Cardiovascular status: stable Postop Assessment: no signs of nausea or vomiting Anesthetic complications: no    Last Vitals:  Vitals:   09/14/16 1936 09/14/16 2036  BP: (!) 125/59 138/73  Pulse: 100 (!) 104  Resp: 20 20  Temp: 37.2 C 37.3 C    Last Pain:  Vitals:   09/15/16 0659  TempSrc:   PainSc: 6                  Kessie Croston A

## 2016-09-15 NOTE — Addendum Note (Signed)
Addendum  created 09/15/16 0805 by Mickel Baas, CRNA   Sign clinical note

## 2016-09-15 NOTE — Progress Notes (Signed)
[facility]  PROGRESS NOTE  [name] [id number] DOB: 1956/05/31 DOA: 09/13/2016 PCP: Sallee Lange, MD   Brief HPI:   *60 year old man PMH diabetes mellitus type 2 with peripheral vascular disease, status post first toe amputation in the past, seen in the office by his podiatrist today for recurrent right foot infection. Dr. Caprice Beaver requested direct admission to the hospital for IV antibiotics, MRI.    Subjective   Patient denies any complaints this morning.s/p partial 2nd ray amputation, right foot.   Assessment/Plan:     1. Partial 2nd ray amputation, right foot/right diabetic foot ulcer with cellulitis-  continuie with pain management. On IV antibiotics  flagyl, ceftriaxone Podiatry is following 2. Diabetes mellitus type 2-stable, continue Levemir 3. Hypertension-blood pressure stable    DVT prophylaxis: SCDs  Code Status: Full code  Family Communication: Discussed with patient, his daughter and wife at bedside   Disposition Plan: To be decided   Consultants:  Podiatry  Procedures:  None        Antibiotics:   Anti-infectives    Start     Dose/Rate Route Frequency Ordered Stop   09/13/16 1400  metroNIDAZOLE (FLAGYL) tablet 500 mg     500 mg Oral Every 8 hours 09/13/16 1253     09/13/16 1315  cefTRIAXone (ROCEPHIN) 2 g in dextrose 5 % 50 mL IVPB     2 g 100 mL/hr over 30 Minutes Intravenous Every 24 hours 09/13/16 1253         Objective   Vitals:   09/14/16 1836 09/14/16 1936 09/14/16 2036 09/15/16 1300  BP: 125/72 (!) 125/59 138/73 (!) 107/59  Pulse: 90 100 (!) 104 84  Resp: 18 20 20 20   Temp: 99.1 F (37.3 C) 98.9 F (37.2 C) 99.1 F (37.3 C) 98.5 F (36.9 C)  TempSrc: Oral Oral Oral Oral  SpO2: 100% 100% 100% 98%  Weight:      Height:        Intake/Output Summary (Last 24 hours) at 09/15/16 1433 Last data filed at 09/15/16 1300  Gross per 24 hour  Intake             1866 ml  Output             1425 ml  Net               441 ml   Filed Weights   09/13/16 1156  Weight: 117.9 kg (260 lb)     Physical Examination:  General exam: Appears calm and comfortable. Respiratory system: Clear to auscultation. Respiratory effort normal. Cardiovascular system:  RRR. No  murmurs, rubs, gallops. No pedal edema. GI system: Abdomen is nondistended, soft and nontender. No organomegaly.  Central nervous system. No focal neurological deficits. 5 x 5 power in all extremities. Skin: Right second toe noted to have erythema and mild swelling. Status post amputation of right big toe. Psychiatry: Alert, oriented x 3.Judgement and insight appear normal. Affect normal.    Data Reviewed: I have personally reviewed following labs and imaging studies  CBG:  Recent Labs Lab 09/13/16 1616 09/14/16 1336 09/14/16 1656  GLUCAP 148* 89 69    CBC:  Recent Labs Lab 09/13/16 1406 09/15/16 0607  WBC 11.4* 8.2  NEUTROABS 9.2*  --   HGB 13.2 12.6*  HCT 39.6 38.2*  MCV 86.7 86.2  PLT 231 [id number]    Basic Metabolic Panel:  Recent Labs Lab 09/13/16 1406 09/15/16 0607  NA 136 136  K 4.1 3.8  CL 101 101  CO2 26 28  GLUCOSE 149* 108*  BUN 23* 19  CREATININE 0.88 0.72  CALCIUM 8.7* 8.5*    Recent Results (from the past 240 hour(s))  Surgical pcr screen     Status: None   Collection Time: 09/13/16  6:18 PM  Result Value Ref Range Status   MRSA, PCR NEGATIVE NEGATIVE Final   Staphylococcus aureus NEGATIVE NEGATIVE Final    Comment:        The Xpert SA Assay (FDA approved for NASAL specimens in patients over 6 years of age), is one component of a comprehensive surveillance program.  Test performance has been validated by [facility] for patients greater than or equal to 29 year old. It is not intended to diagnose infection nor to guide or monitor treatment.   Aerobic/Anaerobic Culture (surgical/deep wound)     Status: None (Preliminary result)   Collection Time: 09/14/16  4:10 PM  Result Value Ref Range  Status   Specimen Description WOUND RIGHT 2ND METATARSAL  Final   Special Requests NONE  Final   Gram Stain   Final    FEW WBC PRESENT, PREDOMINANTLY MONONUCLEAR NO ORGANISMS SEEN    Culture   Final    NO GROWTH < 12 HOURS Performed at [facility]    Report Status PENDING  Incomplete        Studies: Dg Foot Complete Right  Result Date: 09/14/2016 CLINICAL DATA:  60 y/o  M; postop right foot surgery. EXAM: RIGHT FOOT COMPLETE - 3+ VIEW COMPARISON:  09/13/2016 MRI. FINDINGS: Interval transmetatarsal amputation of the second digit. Prior transmetatarsal amputation of the first digit. No interval change of third through fifth digits. Soft tissue irregularity of surgical site. Medial malleolus fixed by threaded screws and a fibular fixed by plate and screws without interval change. Small dorsal and plantar calcaneal spurs. Ankle joint osteoarthrosis with marginal osteophytes. IMPRESSION: Interval transmetatarsal amputation of second digit. Soft tissue postsurgical changes. Electronically Signed   By: Kristine Garbe M.D.   On: 09/14/2016 17:13    Scheduled Meds: . aspirin EC  81 mg Oral Daily  . cefTRIAXone (ROCEPHIN)  IV  2 g Intravenous Q24H   And  . metroNIDAZOLE  500 mg Oral Q8H  . feeding supplement (PRO-STAT SUGAR FREE 64)  30 mL Oral TID  . insulin detemir  40 Units Subcutaneous Q2200  . multivitamin with minerals  1 tablet Oral Daily  . rosuvastatin  5 mg Oral QHS  . senna-docusate  1 tablet Oral BID  . sodium chloride flush  3 mL Intravenous Q12H  . sodium chloride flush  3 mL Intravenous Q12H      Time spent: 25 min  [facility] Pager [phone]. If 7PM-7AM, please contact night-coverage at [email or web address], Office  [phone]  password [place] 09/15/2016, 2:33 PM  LOS: 2 days

## 2016-09-15 NOTE — Progress Notes (Signed)
Inpatient Diabetes Program Recommendations  AACE/ADA: New Consensus Statement on Inpatient Glycemic Control (2015)  Target Ranges:  Prepandial:   less than 140 mg/dL      Peak postprandial:   less than 180 mg/dL (1-2 hours)      Critically ill patients:  140 - 180 mg/dL  Results for [name], [name] (MRN [id number]) as of 09/15/2016 10:44  Ref. Range 09/15/2016 06:07  Glucose Latest Ref Range: 65 - 99 mg/dL 108 (H)   Results for [name], [name] (MRN [id number]) as of 09/15/2016 10:44  Ref. Range 09/13/2016 16:16 09/14/2016 13:36 09/14/2016 16:56  Glucose-Capillary Latest Ref Range: 65 - 99 mg/dL 148 (H) 89 69   Review of Glycemic Control  Diabetes history: DM2 Outpatient Diabetes medications: Levemir 48 units QHS, Jardiance 25 mg daily Current orders for Inpatient glycemic control: Levemir 48 units QHS  Inpatient Diabetes Program Recommendations: Insulin - Basal: Noted glucose of 69 mg/dl on 09/14/16. Please consider slightly decreasing Levemir to 45 units QHS. Correction (SSI): While inpatient, please consider ordering CBGS with Novolog correction scale ACHS. HgbA1C: Per chart, last A1C was 7% on 08/24/16 indicating an average glucose of 154 mg/dl over the past 2-3 months.  Thanks, Barnie Alderman, RN, MSN, CDE Diabetes Coordinator Inpatient Diabetes Program [phone] (Team Pager from 8am to 5pm)

## 2016-09-15 NOTE — Progress Notes (Signed)
Podiatry Progress Note  Subjective [name] is POD 1 S/P partial 2nd ray amputation of the right foot.  He denies any nausea, vomiting, fever or chills.  His pain is well controlled.  Objective Vital signs in last 24 hours:   Temp:  [97.2 F (36.2 C)-98.5 F (36.9 C)] 97.2 F (36.2 C) (11/30 2126) Pulse Rate:  [84-92] 92 (11/30 2126) Resp:  [20] 20 (11/30 2126) BP: (101-107)/(59-66) 101/66 (11/30 2126) SpO2:  [98 %-99 %] 99 % (11/30 2126)  DRESSING:  Clean, dry and intact without strikethrough. INTEGUMENT:  Incision is well approximated with sutures intact.  Surgical wound is packed dorsally.  Erythema is improved.   VASCULAR:  DP 1/4.  PT 1/4.  Edema improved. MUSCULOSKELETAL:   Calves soft and nontender.  Lab/Test Results  Recent Labs  09/13/16 1406 09/15/16 0607  WBC 11.4* 8.2  HGB 13.2 12.6*  HCT 39.6 38.2*  PLT 231 232  NA 136 136  K 4.1 3.8  CL 101 101  CO2 26 28  BUN 23* 19  CREATININE 0.88 0.72  GLUCOSE 149* 108*  CALCIUM 8.7* 8.5*    Recent Results (from the past 240 hour(s))  Surgical pcr screen     Status: None   Collection Time: 09/13/16  6:18 PM  Result Value Ref Range Status   MRSA, PCR NEGATIVE NEGATIVE Final   Staphylococcus aureus NEGATIVE NEGATIVE Final    Comment:        The Xpert SA Assay (FDA approved for NASAL specimens in patients over 25 years of age), is one component of a comprehensive surveillance program.  Test performance has been validated by [facility] for patients greater than or equal to 69 year old. It is not intended to diagnose infection nor to guide or monitor treatment.   Aerobic/Anaerobic Culture (surgical/deep wound)     Status: None (Preliminary result)   Collection Time: 09/14/16  4:10 PM  Result Value Ref Range Status   Specimen Description WOUND RIGHT 2ND METATARSAL  Final   Special Requests NONE  Final   Gram Stain   Final    FEW WBC PRESENT, PREDOMINANTLY MONONUCLEAR NO ORGANISMS SEEN    Culture    Final    NO GROWTH < 12 HOURS Performed at [facility]    Report Status PENDING  Incomplete     Dg Foot Complete Right  Result Date: 09/14/2016 CLINICAL DATA:  60 y/o  M; postop right foot surgery. EXAM: RIGHT FOOT COMPLETE - 3+ VIEW COMPARISON:  09/13/2016 MRI. FINDINGS: Interval transmetatarsal amputation of the second digit. Prior transmetatarsal amputation of the first digit. No interval change of third through fifth digits. Soft tissue irregularity of surgical site. Medial malleolus fixed by threaded screws and a fibular fixed by plate and screws without interval change. Small dorsal and plantar calcaneal spurs. Ankle joint osteoarthrosis with marginal osteophytes. IMPRESSION: Interval transmetatarsal amputation of second digit. Soft tissue postsurgical changes. Electronically Signed   By: Kristine Garbe M.D.   On: 09/14/2016 17:13    Medications Scheduled Meds: . aspirin EC  81 mg Oral Daily  . cefTRIAXone (ROCEPHIN)  IV  2 g Intravenous Q24H   And  . metroNIDAZOLE  500 mg Oral Q8H  . feeding supplement (PRO-STAT SUGAR FREE 64)  30 mL Oral TID  . insulin detemir  40 Units Subcutaneous Q2200  . multivitamin with minerals  1 tablet Oral Daily  . rosuvastatin  5 mg Oral QHS  . senna-docusate  1 tablet Oral  BID  . sodium chloride flush  3 mL Intravenous Q12H  . sodium chloride flush  3 mL Intravenous Q12H   Continuous Infusions: PRN Meds:.sodium chloride, acetaminophen **OR** acetaminophen, ondansetron **OR** ondansetron (ZOFRAN) IV, oxyCODONE-acetaminophen **AND** oxyCODONE, sodium chloride flush  Assessment POD 1 S/P Partial second ray amputation, right foot.  Plan A Betadine dressing was applied.  Will change packing tomorrow.  Recommend infectious disease consult for antibiotic recommendations and length of course.  NWB right foot.  Physical therapy consulted for crutch/walker training.  Bela Nyborg IVAN 09/15/2016, 9:54 PM

## 2016-09-16 ENCOUNTER — Encounter (HOSPITAL_COMMUNITY): Payer: Self-pay | Admitting: Podiatry

## 2016-09-16 MED ORDER — SENNOSIDES-DOCUSATE SODIUM 8.6-50 MG PO TABS: 1.0000 | ORAL_TABLET | Freq: Every evening | ORAL | 0 refills | Status: DC | PRN

## 2016-09-16 MED ORDER — CEPHALEXIN 500 MG PO CAPS: 500.0000 mg | ORAL_CAPSULE | Freq: Two times a day (BID) | ORAL | 0 refills | Status: AC

## 2016-09-16 MED ORDER — METRONIDAZOLE 500 MG PO TABS: 500.0000 mg | ORAL_TABLET | Freq: Three times a day (TID) | ORAL | 0 refills | Status: DC

## 2016-09-16 NOTE — Progress Notes (Signed)
Pt IV X 2, removed.  Pt tolerated well.  Reviewed discharge instructions with pt and family at bedside.  Answered questions at this time.

## 2016-09-16 NOTE — Evaluation (Signed)
Physical Therapy Evaluation Patient Details Name: [name] MRN: [id number] DOB: 04-Jul-1956 Today's Date: 09/16/2016   History of Present Illness  60 year old man PMH diabetes mellitus type 2 with peripheral vascular disease, status post first toe amputation in the past, seen in the office by his podiatrist today for recurrent right foot infection. Dr. Caprice Beaver requested direct admission to the hospital for IV antibiotics, MRI.  Patient reports he's had an ulcer on his right foot near the site of previous amputation of the great toe for months now. Approximate 2 weeks ago it started draining and he was treated with antibiotics by his podiatrist. He had improvement with antibiotics but dropped something on it 11/26 in the evening, the foot became red, painful. Pain is helped by pain medication. He was seen in the office today and referred for direct admission.  He is now s/p partial 2nd ray amputation on 09/14/2016.  Clinical Impression  Pt received sitting up on the EOB, and was agreeable to PT evaluation.  Pt is normally independent with gait, ADL's and IADL"s.  He is retired, but works on the farm still.  During today's PT evaluation, he was able to ambulate 26ft with RW and Mod(I) while maintaining NWB on the R LE.  Recommend that he d/c home with supervision, as well as a w/c with elevating leg rests.  Pt states he has a RW at home, however, he will need one if this is not confirmed.      Follow Up Recommendations Supervision/Assistance - 24 hour    Equipment Recommendations  Wheelchair (measurements PT) (with elevating leg rests. )    Recommendations for Other Services       Precautions / Restrictions Precautions Precautions: Fall Precaution Comments: Due to recent surgery.  Restrictions Weight Bearing Restrictions: Yes RLE Weight Bearing: Non weight bearing      Mobility  Bed Mobility Overal bed mobility:  (Not observed, pt already sitting up on the EOB upon entering the  room. )                Transfers Overall transfer level: Needs assistance Equipment used: Rolling walker (2 wheeled) Transfers: Sit to/from Stand Sit to Stand: Modified independent (Device/Increase time) (vc's for hand placement)            Ambulation/Gait Ambulation/Gait assistance: Modified independent (Device/Increase time) Ambulation Distance (Feet): 30 Feet Assistive device: Rolling walker (2 wheeled)       General Gait Details: Pt was able to remain NWB on the R foot during ambulation.  Shoe donned on the L foot.   Stairs            Wheelchair Mobility    Modified Rankin (Stroke Patients Only)       Balance Overall balance assessment: Needs assistance         Standing balance support: Bilateral upper extremity supported Standing balance-Leahy Scale: Fair                               Pertinent Vitals/Pain Pain Assessment: 0-10 Pain Score: 6  Pain Location: R foot Pain Descriptors / Indicators: Aching;Burning Pain Intervention(s): Limited activity within patient's tolerance;Monitored during session;Repositioned    Home Living   Living Arrangements: Spouse/significant other;Other relatives (2 grandchildren (40 & 66yo)) Available Help at Discharge: Family Type of Home: Mobile home Home Access: Level entry     Home Layout: One level Home Equipment: Walker - standard;Walker - 4 wheels;Bedside commode;Shower  seat      Prior Function Level of Independence: Independent         Comments: Pt is retired, but still working around the farm.      Hand Dominance   Dominant Hand: Right    Extremity/Trunk Assessment   Upper Extremity Assessment: Generalized weakness           Lower Extremity Assessment: Generalized weakness;RLE deficits/detail         Communication   Communication: No difficulties  Cognition Arousal/Alertness: Awake/alert Behavior During Therapy: WFL for tasks assessed/performed Overall Cognitive  Status: Within Functional Limits for tasks assessed                      General Comments      Exercises     Assessment/Plan    PT Assessment Patient needs continued PT services;Patent does not need any further PT services  PT Problem List Decreased balance;Decreased mobility;Decreased activity tolerance;Decreased knowledge of use of DME;Decreased knowledge of precautions;Decreased safety awareness;Decreased skin integrity;Obesity          PT Treatment Interventions DME instruction;Gait training;Functional mobility training;Therapeutic activities    PT Goals (Current goals can be found in the Care Plan section)  Acute Rehab PT Goals PT Goal Formulation: All assessment and education complete, DC therapy    Frequency     Barriers to discharge        Co-evaluation               End of Session Equipment Utilized During Treatment: Gait belt Activity Tolerance: Patient tolerated treatment well Patient left: in chair;with call bell/phone within reach Nurse Communication: Mobility status Jacqlyn Larsen, RN notified of pt's location and mobiltiy status.  Mobility sheet left in the pt's room. )         Time: [id number] PT Time Calculation (min) (ACUTE ONLY): 26 min   Charges:   PT Evaluation $PT Eval Low Complexity: 1 Procedure PT Treatments $Gait Training: 8-22 mins   PT G Codes:        Beth Gerldine Suleiman, PT, DPT X: [phone]

## 2016-09-16 NOTE — Progress Notes (Signed)
Called and discussed with infectious disease on-call Dr. Johnnye Sima, patient needs 2 more weeks of antibiotics including Keflex and Flagyl by mouth after surgery.

## 2016-09-16 NOTE — Discharge Summary (Signed)
Physician Discharge Summary  [name] [id number] DOB: 10-24-1955 DOA: 09/13/2016  PCP: Sallee Lange, MD  Admit date: 09/13/2016 Discharge date: 09/16/2016  Time spent: 25* minutes  Recommendations for Outpatient Follow-up:  1. Follow-up podiatry on 09/20/2016   Discharge Diagnoses:  Principal Problem:   Diabetic foot infection ([place]) Active Problems:   Peripheral vascular disease ([place])   Hypercholesterolemia   Diabetes mellitus ([place])   Discharge Condition: Stable  Diet recommendation:  Carb Modified diet Filed Weights   09/13/16 1156  Weight: 117.9 kg (260 lb)    History of present illness:  *60 year old man PMH diabetes mellitus type 2 with peripheral vascular disease, status post first toe amputation in the past, seen in the office by his podiatrist today for recurrent right foot infection. Dr. Caprice Beaver requested direct admission to the hospital for IV antibiotics, MRI.  Hospital Course:  1. Partial 2nd ray amputation, right foot/right diabetic foot ulcer with cellulitis-   I called and discussed with ID Dr. Johnnye Sima who recommended 2 more weeks of antibiotics with Keflex and Flagyl. Patient to follow-up with Dr. Caprice Beaver as outpatient on 09/20/2016. We'll arrange for home health RN for packing twice a week and home health PT. Patient is nonweightbearing on right lower extremity. Wound culture is showing no growth as of 09/16/2016. 2. Diabetes mellitus type 2-stable, continue Levemir 3. Hypertension-blood pressure stable  Procedures:  Partial second ray amputation right foot  Consultations:  Podiatry  Discharge Exam: Vitals:   09/16/16 0600 09/16/16 1408  BP: (!) 142/85 117/69  Pulse: 75 78  Resp: 20 19  Temp: 97.5 F (36.4 C) 98.9 F (37.2 C)    General: Appears in no acute distress Cardiovascular: RRR S1-S2 Respiratory: Clear to auscultation bilaterally  Discharge Instructions   Discharge Instructions    Diet - low sodium heart healthy     Complete by:  As directed    Increase activity slowly    Complete by:  As directed      Current Discharge Medication List    START taking these medications   Details  cephALEXin (KEFLEX) 500 MG capsule Take 1 capsule (500 mg total) by mouth 2 (two) times daily. Qty: 28 capsule, Refills: 0    metroNIDAZOLE (FLAGYL) 500 MG tablet Take 1 tablet (500 mg total) by mouth every 8 (eight) hours. Qty: 42 tablet, Refills: 0    senna-docusate (SENOKOT-S) 8.6-50 MG tablet Take 1 tablet by mouth at bedtime as needed for mild constipation. Qty: 30 tablet, Refills: 0      CONTINUE these medications which have NOT CHANGED   Details  aspirin EC 81 MG tablet Take 81 mg by mouth daily.    B-D ULTRAFINE III SHORT PEN 31G X 8 MM MISC USE PEN NEEDLES DAILY WITH LANTUS Qty: 100 each, Refills: 5    Insulin Detemir (LEVEMIR) 100 UNIT/ML Pen May titrate up to 50 units into skin at bedtime for diabetes Qty: 1 pen, Refills: 5    JARDIANCE 25 MG TABS tablet TAKE 1 TABLET EVERY DAY Qty: 30 tablet, Refills: 5    oxyCODONE-acetaminophen (PERCOCET) 10-325 MG tablet Take 1 tablet by mouth every 4 (four) hours as needed for pain. As needed for pain Qty: 150 tablet, Refills: 0    rosuvastatin (CRESTOR) 5 MG tablet Take 1 tablet (5 mg total) by mouth at bedtime. Qty: 90 tablet, Refills: 0      STOP taking these medications     halobetasol (ULTRAVATE) 0.05 % ointment      levofloxacin (  LEVAQUIN) 500 MG tablet        Allergies  Allergen Reactions  . Metformin And Related     GI symptoms  . Statins Other (See Comments)    Legs ache, he states Crestor cause shoulder pain  . Vicodin [Hydrocodone-Acetaminophen] Nausea Only   Follow-up Information    [facility] Follow up.   Contact information: [place] [phone]           The results of significant diagnostics from this hospitalization (including imaging, microbiology, ancillary and  laboratory) are listed below for reference.    Significant Diagnostic Studies: Mr Foot Right Wo Contrast  Result Date: 09/13/2016 CLINICAL DATA:  Diabetic foot infection. EXAM: MRI OF THE RIGHT FOREFOOT WITHOUT CONTRAST TECHNIQUE: Multiplanar, multisequence MR imaging of the right foot was performed. No intravenous contrast was administered. COMPARISON:  Radiographs 01/12/2015 FINDINGS: Prior partial first ray resection with amputation at the base of the first metatarsal. Abnormal marrow signal and distal aspect of the second metatarsal and also in the second proximal phalanx suspicious for septic arthritis at the metatarsal phalangeal joint and osteomyelitis. There is also soft tissue swelling/ edema and fluid surrounding the second metatarsal head which is worrisome for abscess. Abnormal T1 and T2 marrow signal in the fifth proximal phalanx could suggest osteomyelitis. Diffuse cellulitis and myofasciitis. No significant midfoot changes. IMPRESSION: 1. Surgical changes from a prior first ray resection. 2. MR findings consistent with septic arthritis at the second metatarsal phalangeal joint with osteomyelitis involving the second metatarsal and second proximal phalanx. 3. Abnormal marrow signal in the fifth proximal phalanx could reflect osteomyelitis. 4. Fluid collections surrounding the second metatarsal are suspicious for abscess. 5. Diffuse cellulitis and myofasciitis. Electronically Signed   By: Marijo Sanes M.D.   On: 09/13/2016 14:06   Dg Foot Complete Right  Result Date: 09/14/2016 CLINICAL DATA:  60 y/o  M; postop right foot surgery. EXAM: RIGHT FOOT COMPLETE - 3+ VIEW COMPARISON:  09/13/2016 MRI. FINDINGS: Interval transmetatarsal amputation of the second digit. Prior transmetatarsal amputation of the first digit. No interval change of third through fifth digits. Soft tissue irregularity of surgical site. Medial malleolus fixed by threaded screws and a fibular fixed by plate and screws without  interval change. Small dorsal and plantar calcaneal spurs. Ankle joint osteoarthrosis with marginal osteophytes. IMPRESSION: Interval transmetatarsal amputation of second digit. Soft tissue postsurgical changes. Electronically Signed   By: Kristine Garbe M.D.   On: 09/14/2016 17:13    Microbiology: Recent Results (from the past 240 hour(s))  Surgical pcr screen     Status: None   Collection Time: 09/13/16  6:18 PM  Result Value Ref Range Status   MRSA, PCR NEGATIVE NEGATIVE Final   Staphylococcus aureus NEGATIVE NEGATIVE Final    Comment:        The Xpert SA Assay (FDA approved for NASAL specimens in patients over 35 years of age), is one component of a comprehensive surveillance program.  Test performance has been validated by [facility] for patients greater than or equal to 64 year old. It is not intended to diagnose infection nor to guide or monitor treatment.   Aerobic/Anaerobic Culture (surgical/deep wound)     Status: None (Preliminary result)   Collection Time: 09/14/16  4:10 PM  Result Value Ref Range Status   Specimen Description WOUND RIGHT 2ND METATARSAL  Final   Special Requests NONE  Final   Gram Stain   Final  FEW WBC PRESENT, PREDOMINANTLY MONONUCLEAR NO ORGANISMS SEEN    Culture   Final    NO GROWTH 2 DAYS NO ANAEROBES ISOLATED; CULTURE IN PROGRESS FOR 5 DAYS Performed at [facility]    Report Status PENDING  Incomplete     Labs: Basic Metabolic Panel:  Recent Labs Lab 09/13/16 1406 09/15/16 0607  NA 136 136  K 4.1 3.8  CL 101 101  CO2 26 28  GLUCOSE 149* 108*  BUN 23* 19  CREATININE 0.88 0.72  CALCIUM 8.7* 8.5*   CBC:  Recent Labs Lab 09/13/16 1406 09/15/16 0607  WBC 11.4* 8.2  NEUTROABS 9.2*  --   HGB 13.2 12.6*  HCT 39.6 38.2*  MCV 86.7 86.2  PLT 231 232    CBG:  Recent Labs Lab 09/13/16 1616 09/14/16 1336 09/14/16 1656 09/15/16 2058  GLUCAP 148* 89 69 156*    Signed:  Reyne Falconi S MD.  [facility] 09/16/2016, 2:43 PM

## 2016-09-16 NOTE — Progress Notes (Signed)
Podiatry Progress Note  Subjective [name] is POD 2 S/P partial 2nd ray amputation of the right foot.  He denies any nausea, vomiting, fever or chills.  His pain is well controlled.  His wife is present for today's visit.  Objective Vital signs in last 24 hours:   Temp:  [97.2 F (36.2 C)-98.5 F (36.9 C)] 97.5 F (36.4 C) (12/01 0600) Pulse Rate:  [75-92] 75 (12/01 0600) Resp:  [20] 20 (12/01 0600) BP: (101-142)/(59-85) 142/85 (12/01 0600) SpO2:  [98 %-100 %] 100 % (12/01 0600)  DRESSING:  Clean, dry and intact without strikethrough. INTEGUMENT:  Incision is well approximated with sutures intact.  Surgical wound is packed dorsally.  Packing was removed.  Mild serosanguinous exudate.  Erythema continues to improve. VASCULAR:  DP 1/4.  PT 1/4.  Edema improved. MUSCULOSKELETAL:   Calves soft and nontender.  Lab/Test Results   Recent Labs  09/13/16 1406 09/15/16 0607  WBC 11.4* 8.2  HGB 13.2 12.6*  HCT 39.6 38.2*  PLT 231 232  NA 136 136  K 4.1 3.8  CL 101 101  CO2 26 28  BUN 23* 19  CREATININE 0.88 0.72  GLUCOSE 149* 108*  CALCIUM 8.7* 8.5*    Recent Results (from the past 240 hour(s))  Surgical pcr screen     Status: None   Collection Time: 09/13/16  6:18 PM  Result Value Ref Range Status   MRSA, PCR NEGATIVE NEGATIVE Final   Staphylococcus aureus NEGATIVE NEGATIVE Final    Comment:        The Xpert SA Assay (FDA approved for NASAL specimens in patients over 54 years of age), is one component of a comprehensive surveillance program.  Test performance has been validated by [facility] for patients greater than or equal to 31 year old. It is not intended to diagnose infection nor to guide or monitor treatment.   Aerobic/Anaerobic Culture (surgical/deep wound)     Status: None (Preliminary result)   Collection Time: 09/14/16  4:10 PM  Result Value Ref Range Status   Specimen Description WOUND RIGHT 2ND METATARSAL  Final   Special Requests NONE  Final    Gram Stain   Final    FEW WBC PRESENT, PREDOMINANTLY MONONUCLEAR NO ORGANISMS SEEN    Culture   Final    NO GROWTH 2 DAYS Performed at [facility]    Report Status PENDING  Incomplete     Dg Foot Complete Right  Result Date: 09/14/2016 CLINICAL DATA:  60 y/o  M; postop right foot surgery. EXAM: RIGHT FOOT COMPLETE - 3+ VIEW COMPARISON:  09/13/2016 MRI. FINDINGS: Interval transmetatarsal amputation of the second digit. Prior transmetatarsal amputation of the first digit. No interval change of third through fifth digits. Soft tissue irregularity of surgical site. Medial malleolus fixed by threaded screws and a fibular fixed by plate and screws without interval change. Small dorsal and plantar calcaneal spurs. Ankle joint osteoarthrosis with marginal osteophytes. IMPRESSION: Interval transmetatarsal amputation of second digit. Soft tissue postsurgical changes. Electronically Signed   By: Kristine Garbe M.D.   On: 09/14/2016 17:13    Medications Scheduled Meds: . aspirin EC  81 mg Oral Daily  . cefTRIAXone (ROCEPHIN)  IV  2 g Intravenous Q24H   And  . metroNIDAZOLE  500 mg Oral Q8H  . feeding supplement (PRO-STAT SUGAR FREE 64)  30 mL Oral TID  . insulin detemir  40 Units Subcutaneous Q2200  . multivitamin with minerals  1 tablet Oral Daily  .  rosuvastatin  5 mg Oral QHS  . senna-docusate  1 tablet Oral BID  . sodium chloride flush  3 mL Intravenous Q12H  . sodium chloride flush  3 mL Intravenous Q12H   Continuous Infusions: PRN Meds:.sodium chloride, acetaminophen **OR** acetaminophen, ondansetron **OR** ondansetron (ZOFRAN) IV, oxyCODONE-acetaminophen **AND** oxyCODONE, sodium chloride flush  Assessment POD 2 S/P Partial second ray amputation, right foot.  Plan Packing changed.  A Betadine dressing was applied.  Antibiotics per ID recommendations.  NWB right foot.  Will need home health to change packing twice a week.  I will change the packing weekly at his  visit with me.  F/U with me in my [place] office on 09/20/2016.  Okay for D/C from my standpoint after he works with PT today.    [name] IVAN 09/16/2016, 12:58 PM

## 2016-09-16 NOTE — Care Management Note (Addendum)
Case Management Note  Patient Details  Name: [name] MRN: [id number] Date of Birth: 12-07-1955   Expected Discharge Date:  09/15/16               Expected Discharge Plan:  [place]  In-House Referral:  NA  Discharge planning Services  CM Consult  Post Acute Care Choice:  Home Health Choice offered to:  Patient  DME Arranged:    DME Agency:     HH Arranged:  RN Golf Agency:     Status of Service:  Completed, signed off  If discussed at [place] of Stay Meetings, dates discussed:    Additional Comments: Patient will need HH RN/PT  at discharge. Has had [facility] in the past and would like to use them again. Romualdo Bolk of [facility] notified and will obtain orders from chart. Patient aware [facility] has 48 hours to initiate services.  Later entry: Patient will discharge with DME from [facility], patient decidng between RW and WC. [facility] aware and working with patient. DME will be delivered prior to discharge.   Bevan Vu, Chauncey Reading, RN 09/16/2016, 1:45 PM

## 2016-09-19 ENCOUNTER — Telehealth: Payer: Self-pay | Admitting: Family Medicine

## 2016-09-19 LAB — AEROBIC/ANAEROBIC CULTURE W GRAM STAIN (SURGICAL/DEEP WOUND): Culture: NO GROWTH

## 2016-09-19 LAB — AEROBIC/ANAEROBIC CULTURE (SURGICAL/DEEP WOUND)

## 2016-09-19 NOTE — Telephone Encounter (Signed)
Patient wanted you to know that he just got out of [facility] on 12/1 and sugar is running high and they changed some of his medication and his iron is low and wanting to know what he can take to bring it up

## 2016-09-19 NOTE — Telephone Encounter (Signed)
Patient's hemoglobin is just slightly low I would not recommend taking anything dramatic he can use over-the-counter iron tablet 1 daily for the next 2 weeks. As for his sugars running high please asked the patient what his readings are currently?

## 2016-09-19 NOTE — Telephone Encounter (Signed)
[facility] on home number

## 2016-09-19 NOTE — Telephone Encounter (Signed)
Glucerna as a fair amount of protein with less sugars. Is a patient taking  Jardiance daily is a patient taking his insulin daily? If so what dose does he say he is taking. Please feel free to talk with me while you have him on the phone to avoid playing tag

## 2016-09-19 NOTE — Telephone Encounter (Signed)
Tried to call no answer (voicemail box not setup)

## 2016-09-19 NOTE — Telephone Encounter (Signed)
Tried to call no answer voicemail box not set up yet 09/19/16

## 2016-09-19 NOTE — Telephone Encounter (Signed)
Notified patient his hemoglobin is just slightly low Dr. Nicki Reaper would not recommend taking anything dramatic he can use over-the-counter iron tablet 1 daily for the next 2 weeks. Patient states that his blood sugar was 269 yesterday and this morning. He wants to know what kind of protein drink he can take that won't effect his blood sugar.  Call back on home number

## 2016-09-22 MED ORDER — INSULIN DETEMIR 100 UNIT/ML FLEXPEN: PEN_INJECTOR | SUBCUTANEOUS | 5 refills | Status: DC

## 2016-09-22 NOTE — Telephone Encounter (Signed)
Tried to call patient's mobile and home number no answer(voicemail box not set up on mobile, home phone just rings) 09/22/16

## 2016-09-22 NOTE — Addendum Note (Signed)
Addended by: Ofilia Neas R on: 09/22/2016 01:52 PM   Modules accepted: Orders

## 2016-09-22 NOTE — Telephone Encounter (Signed)
Spoke with patient and informed her per Dr.Scott Luking- increase long acting insulin 55 units, check sugars twice daily, very important for you to give an update by Monday what readings are. Patient verbalized understanding.

## 2016-09-22 NOTE — Telephone Encounter (Signed)
Spoke with patient. Patient stated that he is taking his Levemir and his [place]. Patient is taking 48 units of insulin at bedtime and he has also stopped drinking boost. He is currently on Keflex and Flaygl. He is on the Keflex for recent amputation of his toe and flaygl for thrush in his mouth. States even after discontinuing boost his sugars are still running high. Please advise?

## 2016-09-22 NOTE — Telephone Encounter (Signed)
Increase long-acting insulin 55 units, check sugars twice daily, very important for the patient to give [place] update by Monday what his readings are

## 2016-09-23 ENCOUNTER — Telehealth: Payer: Self-pay | Admitting: Family Medicine

## 2016-09-23 NOTE — Telephone Encounter (Signed)
Left message return call 09/23/16

## 2016-09-23 NOTE — Telephone Encounter (Signed)
This patient will need short acting insulin with meals and will also need to use a long-acting in the evening. I recommend a long-acting be moved to 60 units daily. Short acting should be 10 units with meals. It will be necessary to talk with his pharmacy try to find out which short acting insulin is covered by his insurance either Humalog or NovoLog then called in accordingly patient will need follow-up in January-patient will need to send [place] readings next week

## 2016-09-23 NOTE — Telephone Encounter (Signed)
Patient states sugar is still running high even though he double medication like he was told. He ate a peanut butter sandwich and drank a diet coke this morning and then took his reading around 8:30 or 9:00 it was 303.

## 2016-09-29 ENCOUNTER — Encounter: Payer: Self-pay | Admitting: Family Medicine

## 2016-09-29 ENCOUNTER — Ambulatory Visit (INDEPENDENT_AMBULATORY_CARE_PROVIDER_SITE_OTHER): Payer: BLUE CROSS/BLUE SHIELD | Admitting: Family Medicine

## 2016-09-29 VITALS — BP 138/80 | Temp 98.4°F

## 2016-09-29 DIAGNOSIS — Z79899 Other long term (current) drug therapy: Secondary | ICD-10-CM

## 2016-09-29 DIAGNOSIS — R609 Edema, unspecified: Secondary | ICD-10-CM

## 2016-09-29 DIAGNOSIS — R509 Fever, unspecified: Secondary | ICD-10-CM

## 2016-09-29 DIAGNOSIS — E119 Type 2 diabetes mellitus without complications: Secondary | ICD-10-CM | POA: Diagnosis not present

## 2016-09-29 LAB — POCT GLUCOSE (DEVICE FOR HOME USE): POC Glucose: 115 mg/dl — AB (ref 70–99)

## 2016-09-29 MED ORDER — OXYCODONE-ACETAMINOPHEN 10-325 MG PO TABS: 1.0000 | ORAL_TABLET | ORAL | 0 refills | Status: DC | PRN

## 2016-09-29 MED ORDER — ONDANSETRON HCL 8 MG PO TABS: 8.0000 mg | ORAL_TABLET | Freq: Three times a day (TID) | ORAL | 2 refills | Status: DC | PRN

## 2016-09-29 NOTE — Progress Notes (Signed)
   Subjective:    Patient ID: [name], male    DOB: 1956/07/02, 60 y.o.   MRN: [id number]  Sinusitis  This is a new problem. Episode onset: 2 weeks. Associated symptoms include congestion and headaches. (Fever)   Vomiting since starting antibiotics cephalexin and metronidazole.   Swelling in both calves and ankles since surgery. Had toe removed. Patient denies any chest pressure tightness pain denies any soreness in his legs.  A1C done 2 weeks ago. Blood sugar has been in the 200's.   Review of Systems  HENT: Positive for congestion.   Neurological: Positive for headaches.  Patient relates nausea headache whenever he takes metronidazole does not one to keep taking it he denies any chest pressure shortness of breath sweats chills fevers     Objective:   Physical Exam Lungs clear no crackles heart is regular no murmurs or gallop pulse normal extremities has 1+ edema in the lower legs on both sides. No tenderness in the calf or pain       Assessment & Plan:  Stop metronidazole I believe this is causing him nausea vomiting  Zofran as needed for nausea  If high fevers or difficulty problems follow-up Pedal edema both legs probably related to inactivity the use of regular Percocet as well as low protein we will check lab work await the results I find no evidence of CHF on exam no evidence of DVT The patient will follow-up with [place] within 6 weeks Adjust up on long-acting insulin to get his sugars under better control the goal is to get the morning sugars between 100-120  He will be having some lab work drawn tomorrow. We will add d-dimer to this. The likelihood of a blood clot is low but he may end up needing to have Doppler studies on his legs

## 2016-09-30 ENCOUNTER — Other Ambulatory Visit: Payer: Self-pay | Admitting: *Deleted

## 2016-09-30 DIAGNOSIS — M7989 Other specified soft tissue disorders: Secondary | ICD-10-CM

## 2016-09-30 DIAGNOSIS — R7989 Other specified abnormal findings of blood chemistry: Secondary | ICD-10-CM

## 2016-09-30 LAB — CBC WITH DIFFERENTIAL/PLATELET
BASOS PCT: 1 %
Basophils Absolute: 89 cells/uL (ref 0–200)
EOS PCT: 5 %
Eosinophils Absolute: 445 cells/uL (ref 15–500)
HEMATOCRIT: 37.6 % — AB (ref 38.5–50.0)
Hemoglobin: 12.3 g/dL — ABNORMAL LOW (ref 13.2–17.1)
LYMPHS PCT: 24 %
Lymphs Abs: 2136 cells/uL (ref 850–3900)
MCH: 28.1 pg (ref 27.0–33.0)
MCHC: 32.7 g/dL (ref 32.0–36.0)
MCV: 85.8 fL (ref 80.0–100.0)
MONO ABS: 890 {cells}/uL (ref 200–950)
MPV: 8.9 fL (ref 7.5–12.5)
Monocytes Relative: 10 %
NEUTROS PCT: 60 %
Neutro Abs: 5340 cells/uL (ref 1500–7800)
PLATELETS: 443 10*3/uL — AB (ref 140–400)
RBC: 4.38 MIL/uL (ref 4.20–5.80)
RDW: 13.9 % (ref 11.0–15.0)
WBC: 8.9 10*3/uL (ref 3.8–10.8)

## 2016-09-30 LAB — BASIC METABOLIC PANEL
BUN: 22 mg/dL (ref 7–25)
CHLORIDE: 104 mmol/L (ref 98–110)
CO2: 25 mmol/L (ref 20–31)
Calcium: 8.4 mg/dL — ABNORMAL LOW (ref 8.6–10.3)
Creat: 0.98 mg/dL (ref 0.70–1.25)
GLUCOSE: 171 mg/dL — AB (ref 65–99)
POTASSIUM: 4.9 mmol/L (ref 3.5–5.3)
SODIUM: 138 mmol/L (ref 135–146)

## 2016-09-30 LAB — HEPATIC FUNCTION PANEL
ALT: 21 U/L (ref 9–46)
AST: 23 U/L (ref 10–35)
Albumin: 3.5 g/dL — ABNORMAL LOW (ref 3.6–5.1)
Alkaline Phosphatase: 51 U/L (ref 40–115)
BILIRUBIN DIRECT: 0.1 mg/dL (ref <=0.2)
BILIRUBIN INDIRECT: 0.5 mg/dL (ref 0.2–1.2)
BILIRUBIN TOTAL: 0.6 mg/dL (ref 0.2–1.2)
Total Protein: 6.3 g/dL (ref 6.1–8.1)

## 2016-09-30 LAB — D-DIMER, QUANTITATIVE (NOT AT ARMC): D DIMER QUANT: 1.44 ug{FEU}/mL — AB (ref <0.50)

## 2016-10-01 LAB — BRAIN NATRIURETIC PEPTIDE: BRAIN NATRIURETIC PEPTIDE: 671.2 pg/mL — AB (ref <100)

## 2016-10-02 ENCOUNTER — Telehealth: Payer: Self-pay | Admitting: Family Medicine

## 2016-10-02 NOTE — Telephone Encounter (Signed)
According to his prescription plan is [name] will no longer be covered come January 1. I recommend that come January 1 he change from [place] to  [place] which works the same way as Geneticist, molecular. 10 mg daily, #90 with 3 refills and discontinue Jardiance at that time. See pharmacy letter-please handle accordingly

## 2016-10-02 NOTE — Telephone Encounter (Signed)
Nurses previously there was a message regarding his insurance company no longer covering [place] this was on a different [place] Mclarney please this regard the medication change. Stay on [place].

## 2016-10-03 ENCOUNTER — Ambulatory Visit (HOSPITAL_COMMUNITY)
Admission: RE | Admit: 2016-10-03 | Discharge: 2016-10-03 | Disposition: A | Discharge status: Home / Self Care | Payer: BLUE CROSS/BLUE SHIELD | Source: Ambulatory Visit | Attending: Family Medicine | Admitting: Family Medicine

## 2016-10-03 DIAGNOSIS — M7989 Other specified soft tissue disorders: Secondary | ICD-10-CM | POA: Insufficient documentation

## 2016-10-03 MED ORDER — FUROSEMIDE 20 MG PO TABS: 20.0000 mg | ORAL_TABLET | ORAL | 4 refills | Status: DC

## 2016-10-03 MED ORDER — POTASSIUM CHLORIDE ER 10 MEQ PO TBCR: 10.0000 meq | EXTENDED_RELEASE_TABLET | Freq: Every day | ORAL | 4 refills | Status: DC

## 2016-10-03 NOTE — Telephone Encounter (Signed)
Ok,eventually his insur may force him to change

## 2016-10-03 NOTE — Telephone Encounter (Signed)
Patient states he has a card right now that covers jardiance with a zero copay- Patient states he just filled it and the pharmacist stated it was still good after the first of the year so he wants to stick with Jardiance for now.

## 2016-10-03 NOTE — Addendum Note (Signed)
Addended by: Dairl Ponder on: 10/03/2016 08:58 AM   Modules accepted: Orders

## 2016-10-03 NOTE — Telephone Encounter (Signed)
See other message

## 2016-10-11 ENCOUNTER — Encounter: Payer: Self-pay | Admitting: Family Medicine

## 2016-10-11 ENCOUNTER — Ambulatory Visit (INDEPENDENT_AMBULATORY_CARE_PROVIDER_SITE_OTHER): Payer: BLUE CROSS/BLUE SHIELD | Admitting: Family Medicine

## 2016-10-11 VITALS — BP 138/82 | Ht 75.0 in | Wt 261.8 lb

## 2016-10-11 DIAGNOSIS — K5909 Other constipation: Secondary | ICD-10-CM | POA: Diagnosis not present

## 2016-10-11 DIAGNOSIS — E119 Type 2 diabetes mellitus without complications: Secondary | ICD-10-CM

## 2016-10-11 NOTE — Progress Notes (Signed)
   Subjective:    Patient ID: [name], male    DOB: 1956-05-31, 60 y.o.   MRN: [id number]  HPI  Patient arrives for a follow up on hospitalization for foot infection. Patient had a toe amputated and is due to see foot doctor on Friday. The patient is not running any fevers. States his sugars are actually running much better than what they were. He also states that his overall energy level is doing good he is trying eat healthy.  His pain is still severe and he is had a take typically 5 pain pills a day does not cause drowsiness but is causing some constipation issues patient is up-to-date on colonoscopy Review of Systems Please see above.    Objective:   Physical Exam Lungs are clear heart regular blood pressure good extremities no edema       Assessment & Plan:  He will follow-up with podiatry later this week  Diabetes under better control parameters discussed in detail  Pain control with pain medicine has 2 additional prescriptions we will follow him up in February.  As for the constipation I recommend MiraLAX if ongoing trouble he should notify us. Patient will be due colonoscopy in 2019 sooner problems

## 2016-10-18 ENCOUNTER — Telehealth: Payer: Self-pay | Admitting: Family Medicine

## 2016-10-18 NOTE — Telephone Encounter (Signed)
Requesting Rx for one touch ultra blue test strips sent to [facility].

## 2016-10-18 NOTE — Telephone Encounter (Signed)
Notified wife script will be faxed to pharmacy today.

## 2016-10-26 ENCOUNTER — Other Ambulatory Visit: Payer: Self-pay | Admitting: *Deleted

## 2016-10-26 MED ORDER — EMPAGLIFLOZIN 25 MG PO TABS
25.0000 mg | ORAL_TABLET | Freq: Every day | ORAL | 5 refills | Status: DC
Start: 2016-10-26 — End: 2016-11-08

## 2016-10-27 ENCOUNTER — Ambulatory Visit: Payer: BLUE CROSS/BLUE SHIELD | Admitting: Family Medicine

## 2016-11-01 ENCOUNTER — Telehealth: Payer: Self-pay

## 2016-11-01 NOTE — Telephone Encounter (Signed)
I would recommend Farxiga 10 mg , 1 daily, #30, 5 refills-the last time this happened the patient wondered to stay on Jardiance because he had a free co-pay card. What this is indicating is his insurance will not pay for their portion regardless of his co-pay card-explain to patient Jardiance not covered-more than likely on line he would be able to find a co-pay card for [place]

## 2016-11-01 NOTE — Telephone Encounter (Signed)
Patient states he has multiple cards for free Jardiance and is going to call pharmacy to see if they still work and call us back before we switch his medicine.

## 2016-11-01 NOTE — Telephone Encounter (Signed)
Fax from [facility] 25 mg 1 tablet daily is not covered by insurance. Insurance alternative requests are [place] or [place]. Please advise.

## 2016-11-04 NOTE — Telephone Encounter (Signed)
Patient has a prescription for free Jardiance for this month and wants to stay with this med for now.

## 2016-11-04 NOTE — Telephone Encounter (Signed)
He can stick with Jardiance for now. Eventually his insurance company will not pay that portion and he will be forced to switch because of cost

## 2016-11-08 ENCOUNTER — Telehealth: Payer: Self-pay | Admitting: Pediatrics

## 2016-11-08 MED ORDER — JARDIANCE 25 MG PO TABS: 25.0000 mg | ORAL_TABLET | Freq: Every day | ORAL | 5 refills | Status: DC

## 2016-11-08 NOTE — Telephone Encounter (Signed)
Pharmacy sent fax stating rx for [name] must be DAW 1. I changed and resent.

## 2016-11-13 ENCOUNTER — Other Ambulatory Visit: Payer: Self-pay | Admitting: Family Medicine

## 2016-11-22 ENCOUNTER — Ambulatory Visit (INDEPENDENT_AMBULATORY_CARE_PROVIDER_SITE_OTHER): Payer: BLUE CROSS/BLUE SHIELD | Admitting: Family Medicine

## 2016-11-22 ENCOUNTER — Encounter: Payer: Self-pay | Admitting: Family Medicine

## 2016-11-22 VITALS — BP 112/80 | Ht 75.0 in | Wt 264.5 lb

## 2016-11-22 DIAGNOSIS — L97411 Non-pressure chronic ulcer of right heel and midfoot limited to breakdown of skin: Secondary | ICD-10-CM | POA: Diagnosis not present

## 2016-11-22 DIAGNOSIS — Z794 Long term (current) use of insulin: Secondary | ICD-10-CM

## 2016-11-22 DIAGNOSIS — E784 Other hyperlipidemia: Secondary | ICD-10-CM

## 2016-11-22 DIAGNOSIS — E1142 Type 2 diabetes mellitus with diabetic polyneuropathy: Secondary | ICD-10-CM | POA: Diagnosis not present

## 2016-11-22 DIAGNOSIS — L97509 Non-pressure chronic ulcer of other part of unspecified foot with unspecified severity: Secondary | ICD-10-CM

## 2016-11-22 DIAGNOSIS — E11621 Type 2 diabetes mellitus with foot ulcer: Secondary | ICD-10-CM | POA: Diagnosis not present

## 2016-11-22 DIAGNOSIS — G894 Chronic pain syndrome: Secondary | ICD-10-CM | POA: Diagnosis not present

## 2016-11-22 DIAGNOSIS — E7849 Other hyperlipidemia: Secondary | ICD-10-CM

## 2016-11-22 MED ORDER — INSULIN GLARGINE 300 UNIT/ML ~~LOC~~ SOPN: 45.0000 [IU] | PEN_INJECTOR | Freq: Every day | SUBCUTANEOUS | 5 refills | Status: DC

## 2016-11-22 MED ORDER — OXYCODONE-ACETAMINOPHEN 10-325 MG PO TABS: 1.0000 | ORAL_TABLET | ORAL | 0 refills | Status: DC | PRN

## 2016-11-22 MED ORDER — DAPAGLIFLOZIN PROPANEDIOL 10 MG PO TABS: 10.0000 mg | ORAL_TABLET | Freq: Every day | ORAL | 5 refills | Status: DC

## 2016-11-22 MED ORDER — CIPROFLOXACIN HCL 500 MG PO TABS: 500.0000 mg | ORAL_TABLET | Freq: Two times a day (BID) | ORAL | 0 refills | Status: DC

## 2016-11-22 NOTE — Progress Notes (Signed)
   Subjective:    Patient ID: [name], male    DOB: Aug 08, 1956, 61 y.o.   MRN: [id number]  HPI This patient was seen today for chronic pain  The medication list was reviewed and updated.   -Compliance with medication: yes  - Number patient states they take daily: 5 daily   -when was the last dose patient took: today  The patient was advised the importance of maintaining medication and not using illegal substances with these.  Refills needed: yes  The patient was educated that we can provide 3 monthly scripts for their medication, it is their responsibility to follow the instructions.  Side effects or complications from medications: none  Patient is aware that pain medications are meant to minimize the severity of the pain to allow their pain levels to improve to allow for better function. They are aware of that pain medications cannot totally remove their pain.  Due for UDT ( at least once per year) : utd   Patient needs to discuss his medications and insurance.  We had a long discussion today regarding his insulin what is covered by his insurance his co-pays is difficult time affording the cost of medications  He denies any cardiac symptoms currently denies any excessive swelling in his legs no shortness of breath or chest pain   He is being treated by podiatry for foot ulcer  Review of Systems  patiet denisheadache enies fever denies difficulty breathing no chest tightness presure pain no vomitig or diarrhea. Does relate joint pain.    Objective:   Physical Exam  Neck no masses lungs are clear no crackles heart regular abdomen soft no guarding extremities trace edema patient has evidence of diabetic foot ulcer on the right foot also evidence of neuropathy     Assessment & Plan:  Diabetes patient having difficult time affording medication is requesting change in medication. Stop Girardi evidence stop Levemir. Will useFarxiga if he can afford it if not he will just  go with insulin alone Toujeo patient will start off 45 units and titrate up as necessary to get morning sugars down  Previous lipids reviewed with patient continue Crestor if unable to afford this will go with a different medicine  Pain medication patient denies abusing it 3 prescriptions were given  Diabetic foot ulcer Cipro twice a day for 7 days  Will refer to prescription assistance program I am not sure if they will be able to help him since he is now on Medicare  25 minutes spent with the patient greater than half in discussion of multiple issues

## 2016-11-22 NOTE — Patient Instructions (Signed)

## 2016-11-23 NOTE — Progress Notes (Signed)
Who is Garnette Gunner? This patient has [employer] [facility]. Not Medicare.

## 2016-12-01 ENCOUNTER — Telehealth: Payer: Self-pay | Admitting: Family Medicine

## 2016-12-01 NOTE — Telephone Encounter (Signed)
Patient is requesting help with his medication please assist with referral to Garnette Gunner thank you so much

## 2016-12-02 NOTE — Telephone Encounter (Signed)
Not something I've ever done Could you help with this?

## 2016-12-08 ENCOUNTER — Telehealth: Payer: Self-pay | Admitting: Family Medicine

## 2016-12-08 NOTE — Telephone Encounter (Signed)
Received a fax from [facility] requesting and alternative be sent in in place of patient's toujeo solostar. Medication is not covered without a prior auth. Will Cover [facility]as, Levemir, or [employer]. Please advise?

## 2016-12-09 NOTE — Telephone Encounter (Signed)
It is difficult to know for certain the dosing but most of the time it's a 1-1 dosing. I would recommend the prescription state 45 units Levemir daily may titrate up to 60 units. May have 90 day prescription with 2 refills. Informed the patient that he may need to watch his sugars closely may need more or less depending on how his sugars read he can certainly call us with any input regarding this at The office.

## 2016-12-09 NOTE — Telephone Encounter (Signed)
Left message to return call 

## 2016-12-23 MED ORDER — INSULIN DETEMIR 100 UNIT/ML FLEXPEN: PEN_INJECTOR | SUBCUTANEOUS | 5 refills | Status: DC

## 2016-12-23 NOTE — Telephone Encounter (Signed)
Spoke with patient and informed her per Dr.Scott Luking- It is difficult to know for certain the dosing but most of the time it's a 1-1 dosing. Dr.Scott  would recommend the prescription 45 units Levemir daily may titrate up to 60 units. May have 90 day prescription with 2 refills. Informed the patient that he may need to watch his sugars closely may need more or less depending on how his sugars read he can certainly call us with any input regarding this at The office. Patient verbalized understanding.

## 2016-12-23 NOTE — Addendum Note (Signed)
Addended by: Launa Grill on: 12/23/2016 09:36 AM   Modules accepted: Orders

## 2017-01-10 ENCOUNTER — Other Ambulatory Visit: Payer: Self-pay | Admitting: Family Medicine

## 2017-01-25 ENCOUNTER — Other Ambulatory Visit: Payer: Self-pay | Admitting: *Deleted

## 2017-01-25 MED ORDER — DAPAGLIFLOZIN PROPANEDIOL 10 MG PO TABS: 10.0000 mg | ORAL_TABLET | Freq: Every day | ORAL | 1 refills | Status: DC

## 2017-01-26 ENCOUNTER — Other Ambulatory Visit: Payer: Self-pay | Admitting: *Deleted

## 2017-01-26 MED ORDER — DAPAGLIFLOZIN PROPANEDIOL 10 MG PO TABS: 10.0000 mg | ORAL_TABLET | Freq: Every day | ORAL | 1 refills | Status: DC

## 2017-02-16 ENCOUNTER — Telehealth: Payer: Self-pay | Admitting: Family Medicine

## 2017-02-16 DIAGNOSIS — E119 Type 2 diabetes mellitus without complications: Secondary | ICD-10-CM

## 2017-02-16 DIAGNOSIS — D649 Anemia, unspecified: Secondary | ICD-10-CM

## 2017-02-16 DIAGNOSIS — E785 Hyperlipidemia, unspecified: Secondary | ICD-10-CM

## 2017-02-16 NOTE — Telephone Encounter (Signed)
Lipid, liver, metabolic 7, Y5R, CBC-hyperlipidemia, diabetes, anemia

## 2017-02-16 NOTE — Telephone Encounter (Signed)
Spoke with patient and informed him per Dr.Scott Luking- labs were ordered for upcoming appointment. Patient verbalized understanding.

## 2017-02-16 NOTE — Telephone Encounter (Signed)
Patient has appointment on 5/7 and wondering if he needs lab work done before appointment.

## 2017-02-20 ENCOUNTER — Encounter: Payer: Self-pay | Admitting: Family Medicine

## 2017-02-20 ENCOUNTER — Ambulatory Visit (INDEPENDENT_AMBULATORY_CARE_PROVIDER_SITE_OTHER): Payer: BLUE CROSS/BLUE SHIELD | Admitting: Family Medicine

## 2017-02-20 VITALS — BP 128/74 | Ht 75.0 in | Wt 264.1 lb

## 2017-02-20 DIAGNOSIS — M170 Bilateral primary osteoarthritis of knee: Secondary | ICD-10-CM | POA: Diagnosis not present

## 2017-02-20 DIAGNOSIS — E782 Mixed hyperlipidemia: Secondary | ICD-10-CM | POA: Diagnosis not present

## 2017-02-20 DIAGNOSIS — G894 Chronic pain syndrome: Secondary | ICD-10-CM

## 2017-02-20 DIAGNOSIS — Z9889 Other specified postprocedural states: Secondary | ICD-10-CM

## 2017-02-20 DIAGNOSIS — I739 Peripheral vascular disease, unspecified: Secondary | ICD-10-CM | POA: Diagnosis not present

## 2017-02-20 DIAGNOSIS — Z89419 Acquired absence of unspecified great toe: Secondary | ICD-10-CM | POA: Insufficient documentation

## 2017-02-20 MED ORDER — OXYCODONE-ACETAMINOPHEN 10-325 MG PO TABS: 1.0000 | ORAL_TABLET | ORAL | 0 refills | Status: DC | PRN

## 2017-02-20 NOTE — Progress Notes (Signed)
   Subjective:    Patient ID: [name], male    DOB: 04/14/1956, 61 y.o.   MRN: [id number]  HPI This patient was seen today for chronic pain  The medication list was reviewed and updated.   -Compliance with medication: Patient states takes medication daily   - Number patient states they take daily: States takes 5 per day   -when was the last dose patient took? Last dose 0400 The patient was advised the importance of maintaining medication and not using illegal substances with these.  Refills needed: Yes   The patient was educated that we can provide 3 monthly scripts for their medication, it is their responsibility to follow the instructions.  Side effects or complications from medications: None   Patient is aware that pain medications are meant to minimize the severity of the pain to allow their pain levels to improve to allow for better function. They are aware of that pain medications cannot totally remove their pain.  Due for UDT ( at least once per year) : 08/24/2017  Patient states he will have labs drawn on tomorrow.  Takes his 81 mg aspirin daily no trouble Takes his diabetes medicine denies any low sugars Uses his injections without trouble Fluid pills on a regular basis keeps blood pressure under good control Takes his cholesterol medicine previous labs reviewed with patient.     Review of Systems  Constitutional: Negative for activity change, appetite change and fatigue.  HENT: Negative for congestion.   Respiratory: Negative for cough.   Cardiovascular: Negative for chest pain.  Gastrointestinal: Negative for abdominal pain.  Endocrine: Negative for polydipsia and polyphagia.  Neurological: Negative for weakness.  Psychiatric/Behavioral: Negative for confusion.       Objective:   Physical Exam  Constitutional: He appears well-nourished. No distress.  Cardiovascular: Normal rate, regular rhythm and normal heart sounds.   No murmur  heard. Pulmonary/Chest: Effort normal and breath sounds normal. No respiratory distress.  Musculoskeletal: He exhibits no edema.  Lymphadenopathy:    He has no cervical adenopathy.  Neurological: He is alert.  Psychiatric: His behavior is normal.  Vitals reviewed.  Patient has a history of peripheral Vassar disease as well as toe amputation. No sign of any ulcers on it.       Assessment & Plan:  Chronic arthritis of both knees as well as neuropathy in feet-takes pain medicine up to 5 times per day does not abuse medicine. Continue current medication 3 prescriptions given drug registry was checked  Hyperlipidemia continue medication watch diet check lab work previous labs reviewed  Blood pressure good control  Diabetes check lab work previous lab work looked good watch diet continue insulin continue [place]  History of amputation no sign of foot ulcers currently does have peripheral vascular disease  Follow-up 3 months

## 2017-02-23 ENCOUNTER — Other Ambulatory Visit: Payer: Self-pay | Admitting: *Deleted

## 2017-02-23 MED ORDER — INSULIN DETEMIR 100 UNIT/ML FLEXPEN: PEN_INJECTOR | SUBCUTANEOUS | 5 refills | Status: DC

## 2017-02-24 ENCOUNTER — Other Ambulatory Visit: Payer: Self-pay | Admitting: Family Medicine

## 2017-02-28 LAB — CBC WITH DIFFERENTIAL/PLATELET
BASOS ABS: 74 {cells}/uL (ref 0–200)
BASOS PCT: 1 %
EOS ABS: 370 {cells}/uL (ref 15–500)
Eosinophils Relative: 5 %
HCT: 37.1 % — ABNORMAL LOW (ref 38.5–50.0)
Hemoglobin: 12.5 g/dL — ABNORMAL LOW (ref 13.2–17.1)
LYMPHS PCT: 20 %
Lymphs Abs: 1480 cells/uL (ref 850–3900)
MCH: 27.7 pg (ref 27.0–33.0)
MCHC: 33.7 g/dL (ref 32.0–36.0)
MCV: 82.1 fL (ref 80.0–100.0)
MONO ABS: 814 {cells}/uL (ref 200–950)
MPV: 8.9 fL (ref 7.5–12.5)
Monocytes Relative: 11 %
Neutro Abs: 4662 cells/uL (ref 1500–7800)
Neutrophils Relative %: 63 %
PLATELETS: 245 10*3/uL (ref 140–400)
RBC: 4.52 MIL/uL (ref 4.20–5.80)
RDW: 14.8 % (ref 11.0–15.0)
WBC: 7.4 10*3/uL (ref 3.8–10.8)

## 2017-02-28 LAB — BASIC METABOLIC PANEL
BUN: 21 mg/dL (ref 7–25)
CHLORIDE: 105 mmol/L (ref 98–110)
CO2: 28 mmol/L (ref 20–31)
CREATININE: 0.97 mg/dL (ref 0.70–1.25)
Calcium: 9.2 mg/dL (ref 8.6–10.3)
GLUCOSE: 104 mg/dL — AB (ref 65–99)
POTASSIUM: 4.6 mmol/L (ref 3.5–5.3)
Sodium: 141 mmol/L (ref 135–146)

## 2017-02-28 LAB — HEPATIC FUNCTION PANEL
ALK PHOS: 54 U/L (ref 40–115)
ALT: 13 U/L (ref 9–46)
AST: 16 U/L (ref 10–35)
Albumin: 4.2 g/dL (ref 3.6–5.1)
BILIRUBIN DIRECT: 0.2 mg/dL (ref <=0.2)
BILIRUBIN INDIRECT: 0.4 mg/dL (ref 0.2–1.2)
BILIRUBIN TOTAL: 0.6 mg/dL (ref 0.2–1.2)
Total Protein: 6.7 g/dL (ref 6.1–8.1)

## 2017-02-28 LAB — LIPID PANEL
CHOL/HDL RATIO: 3.5 ratio (ref <5.0)
Cholesterol: 111 mg/dL (ref <200)
HDL: 32 mg/dL — AB (ref >40)
LDL CALC: 67 mg/dL (ref <100)
Triglycerides: 60 mg/dL (ref <150)
VLDL: 12 mg/dL (ref <30)

## 2017-03-01 LAB — HEMOGLOBIN A1C
Hgb A1c MFr Bld: 7.2 % — ABNORMAL HIGH (ref <5.7)
MEAN PLASMA GLUCOSE: 160 mg/dL

## 2017-04-07 ENCOUNTER — Other Ambulatory Visit: Payer: Self-pay | Admitting: Family Medicine

## 2017-05-07 ENCOUNTER — Other Ambulatory Visit: Payer: Self-pay | Admitting: Family Medicine

## 2017-05-24 ENCOUNTER — Encounter: Payer: Self-pay | Admitting: Family Medicine

## 2017-05-24 ENCOUNTER — Ambulatory Visit (INDEPENDENT_AMBULATORY_CARE_PROVIDER_SITE_OTHER): Payer: Medicare HMO | Admitting: Family Medicine

## 2017-05-24 VITALS — BP 132/82 | Ht 75.0 in | Wt 252.0 lb

## 2017-05-24 DIAGNOSIS — Z9889 Other specified postprocedural states: Secondary | ICD-10-CM

## 2017-05-24 DIAGNOSIS — Z125 Encounter for screening for malignant neoplasm of prostate: Secondary | ICD-10-CM

## 2017-05-24 DIAGNOSIS — E782 Mixed hyperlipidemia: Secondary | ICD-10-CM

## 2017-05-24 DIAGNOSIS — Z89419 Acquired absence of unspecified great toe: Secondary | ICD-10-CM | POA: Diagnosis not present

## 2017-05-24 DIAGNOSIS — Z79899 Other long term (current) drug therapy: Secondary | ICD-10-CM | POA: Diagnosis not present

## 2017-05-24 DIAGNOSIS — E1142 Type 2 diabetes mellitus with diabetic polyneuropathy: Secondary | ICD-10-CM

## 2017-05-24 DIAGNOSIS — I739 Peripheral vascular disease, unspecified: Secondary | ICD-10-CM | POA: Diagnosis not present

## 2017-05-24 MED ORDER — DAPAGLIFLOZIN PROPANEDIOL 10 MG PO TABS: 10.0000 mg | ORAL_TABLET | Freq: Every day | ORAL | 3 refills | Status: DC

## 2017-05-24 MED ORDER — OXYCODONE-ACETAMINOPHEN 10-325 MG PO TABS: 1.0000 | ORAL_TABLET | ORAL | 0 refills | Status: DC | PRN

## 2017-05-24 NOTE — Progress Notes (Signed)
   Subjective:    Patient ID: [name], male    DOB: 14-Oct-1956, 61 y.o.   MRN: [id number]  HPI This patient was seen today for chronic pain  The medication list was reviewed and updated.   -Compliance with medication: yes  - Number patient states they take daily: 4-5 a day  -when was the last dose patient took? This am 6 am  The patient was advised the importance of maintaining medication and not using illegal substances with these.  Refills needed: yes  The patient was educated that we can provide 3 monthly scripts for their medication, it is their responsibility to follow the instructions.  Side effects or complications from medications: none  Patient is aware that pain medications are meant to minimize the severity of the pain to allow their pain levels to improve to allow for better function. They are aware of that pain medications cannot totally remove their pain.  Due for UDT ( at least once per year) : 08/2016  He relates compliance with his diabetes medicine as well as his cholesterol medicine. He states he's not having any low sugar spells. Keeps pain medicine in a safe spot    Patient states pain medicine does help him with function. He's able to do a lot more first his if he did not have it  Review of Systems Currently denies any chest tightness pressure pain denies any wheezing difficulty breathing no blood in stool no hematuria does relate a lot of foot pain back pain and bilateral sciatic type pain    Objective:   Physical Exam Neck no masses lungs clear no crackles heart regular pulse normal extremities no edema diabetic foot exam completed  Denies any chest tightness pressure pain shortness of breath 25 minutes was spent with the patient. Greater than half the time was spent in discussion and answering questions and counseling regarding the issues that the patient came in for today.     Assessment & Plan:  Diabetic peripheral neuropathy chronic back  pain-3 prescriptions pain medicine given he does not abuse medication drug registry was checked it does help him with his function  Peripheral Vassar disease very important for the patient take good care of his feet no sign of any ulcers today  History of amputation there is no sign of any ulcer where the amputation was  Hyperlipidemia continue medication previous labs ordered previous labs reviewed Sort or  Wellness exam on next visit along with pain visit  No sign of any anginal issues currently

## 2017-05-26 DIAGNOSIS — L851 Acquired keratosis [keratoderma] palmaris et plantaris: Secondary | ICD-10-CM | POA: Diagnosis not present

## 2017-05-26 DIAGNOSIS — I739 Peripheral vascular disease, unspecified: Secondary | ICD-10-CM | POA: Diagnosis not present

## 2017-05-26 DIAGNOSIS — B351 Tinea unguium: Secondary | ICD-10-CM | POA: Diagnosis not present

## 2017-05-26 DIAGNOSIS — E1142 Type 2 diabetes mellitus with diabetic polyneuropathy: Secondary | ICD-10-CM | POA: Diagnosis not present

## 2017-06-05 ENCOUNTER — Telehealth: Payer: Self-pay | Admitting: Family Medicine

## 2017-06-05 MED ORDER — OXYCODONE-ACETAMINOPHEN 10-325 MG PO TABS: 1.0000 | ORAL_TABLET | ORAL | 0 refills | Status: DC | PRN

## 2017-06-05 NOTE — Telephone Encounter (Signed)
He may have a prescription for his oxycodone 30 day supply dated for 06/09/2017

## 2017-06-05 NOTE — Telephone Encounter (Signed)
Pt called requested a refill on  oxyCODONE-acetaminophen (PERCOCET) 10-325 MG tablet  Pt has an appt next wk.

## 2017-06-05 NOTE — Telephone Encounter (Signed)
MESSAGE WAS PUT IN ON THE WRONG PT BY ERROR

## 2017-06-05 NOTE — Telephone Encounter (Signed)
Spoke with patient and informed him per Dr.Scott Luking- prescription is ready for pick up. Patient verbalized understanding.

## 2017-06-05 NOTE — Telephone Encounter (Signed)
Left message return call 06/05/17 (prescription printed)

## 2017-07-23 ENCOUNTER — Other Ambulatory Visit: Payer: Self-pay | Admitting: Family Medicine

## 2017-08-18 ENCOUNTER — Telehealth: Payer: Self-pay | Admitting: Family Medicine

## 2017-08-18 DIAGNOSIS — E1142 Type 2 diabetes mellitus with diabetic polyneuropathy: Secondary | ICD-10-CM | POA: Diagnosis not present

## 2017-08-18 DIAGNOSIS — B351 Tinea unguium: Secondary | ICD-10-CM | POA: Diagnosis not present

## 2017-08-18 DIAGNOSIS — I739 Peripheral vascular disease, unspecified: Secondary | ICD-10-CM | POA: Diagnosis not present

## 2017-08-18 DIAGNOSIS — L851 Acquired keratosis [keratoderma] palmaris et plantaris: Secondary | ICD-10-CM | POA: Diagnosis not present

## 2017-08-18 NOTE — Telephone Encounter (Signed)
He has active orders that were put in in august-he should do all of these- thanks ( nurses feel free to double check this)

## 2017-08-18 NOTE — Telephone Encounter (Signed)
Patient has an appointment on 08/24/17 with Dr. Nicki Reaper. Requesting orders for labs.

## 2017-08-18 NOTE — Telephone Encounter (Signed)
I called and left a vm.CS

## 2017-08-18 NOTE — Telephone Encounter (Signed)
Last labs 05/24/17 lipid, liver, microalb, psa, bmp, a1c

## 2017-08-21 NOTE — Telephone Encounter (Signed)
I called and left a vm,asked that he r/c.

## 2017-08-24 ENCOUNTER — Ambulatory Visit (INDEPENDENT_AMBULATORY_CARE_PROVIDER_SITE_OTHER): Payer: Medicare HMO | Admitting: Family Medicine

## 2017-08-24 ENCOUNTER — Encounter: Payer: Self-pay | Admitting: Family Medicine

## 2017-08-24 VITALS — BP 132/86 | Ht 75.0 in | Wt 258.0 lb

## 2017-08-24 DIAGNOSIS — I739 Peripheral vascular disease, unspecified: Secondary | ICD-10-CM

## 2017-08-24 DIAGNOSIS — Z125 Encounter for screening for malignant neoplasm of prostate: Secondary | ICD-10-CM | POA: Diagnosis not present

## 2017-08-24 DIAGNOSIS — Z0000 Encounter for general adult medical examination without abnormal findings: Secondary | ICD-10-CM

## 2017-08-24 DIAGNOSIS — E782 Mixed hyperlipidemia: Secondary | ICD-10-CM | POA: Diagnosis not present

## 2017-08-24 DIAGNOSIS — Z1211 Encounter for screening for malignant neoplasm of colon: Secondary | ICD-10-CM

## 2017-08-24 DIAGNOSIS — Z79899 Other long term (current) drug therapy: Secondary | ICD-10-CM

## 2017-08-24 DIAGNOSIS — E119 Type 2 diabetes mellitus without complications: Secondary | ICD-10-CM | POA: Diagnosis not present

## 2017-08-24 DIAGNOSIS — I70249 Atherosclerosis of native arteries of left leg with ulceration of unspecified site: Secondary | ICD-10-CM

## 2017-08-24 DIAGNOSIS — Z1159 Encounter for screening for other viral diseases: Secondary | ICD-10-CM | POA: Diagnosis not present

## 2017-08-24 DIAGNOSIS — I70239 Atherosclerosis of native arteries of right leg with ulceration of unspecified site: Secondary | ICD-10-CM

## 2017-08-24 DIAGNOSIS — Z23 Encounter for immunization: Secondary | ICD-10-CM | POA: Diagnosis not present

## 2017-08-24 DIAGNOSIS — G894 Chronic pain syndrome: Secondary | ICD-10-CM | POA: Diagnosis not present

## 2017-08-24 DIAGNOSIS — S3992XA Unspecified injury of lower back, initial encounter: Secondary | ICD-10-CM

## 2017-08-24 LAB — POCT GLYCOSYLATED HEMOGLOBIN (HGB A1C): Hemoglobin A1C: 6.7

## 2017-08-24 MED ORDER — OXYCODONE-ACETAMINOPHEN 10-325 MG PO TABS: 1.0000 | ORAL_TABLET | ORAL | 0 refills | Status: DC | PRN

## 2017-08-24 MED ORDER — INSULIN DETEMIR 100 UNIT/ML FLEXPEN: PEN_INJECTOR | SUBCUTANEOUS | 5 refills | Status: DC

## 2017-08-24 NOTE — Telephone Encounter (Signed)
Patient was seen in office today for a physical and had blood work ordered at appointment.
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
## 2017-10-05 ENCOUNTER — Other Ambulatory Visit: Payer: Self-pay | Admitting: Family Medicine

## 2017-10-05 DIAGNOSIS — Z79899 Other long term (current) drug therapy: Secondary | ICD-10-CM | POA: Diagnosis not present

## 2017-10-05 DIAGNOSIS — Z1159 Encounter for screening for other viral diseases: Secondary | ICD-10-CM | POA: Diagnosis not present

## 2017-10-05 DIAGNOSIS — Z125 Encounter for screening for malignant neoplasm of prostate: Secondary | ICD-10-CM | POA: Diagnosis not present

## 2017-10-05 DIAGNOSIS — E119 Type 2 diabetes mellitus without complications: Secondary | ICD-10-CM | POA: Diagnosis not present

## 2017-10-05 DIAGNOSIS — E782 Mixed hyperlipidemia: Secondary | ICD-10-CM | POA: Diagnosis not present

## 2017-10-06 LAB — LIPID PANEL
CHOL/HDL RATIO: 3.5 (calc) (ref <5.0)
CHOLESTEROL: 117 mg/dL (ref <200)
HDL: 33 mg/dL — AB (ref >40)
LDL Cholesterol (Calc): 70 mg/dL (calc)
Non-HDL Cholesterol (Calc): 84 mg/dL (calc) (ref <130)
Triglycerides: 68 mg/dL (ref <150)

## 2017-10-06 LAB — BASIC METABOLIC PANEL WITH GFR
BUN: 23 mg/dL (ref 7–25)
CALCIUM: 9.3 mg/dL (ref 8.6–10.3)
CO2: 27 mmol/L (ref 20–32)
CREATININE: 0.97 mg/dL (ref 0.70–1.25)
Chloride: 102 mmol/L (ref 98–110)
GFR, EST AFRICAN AMERICAN: 97 mL/min/{1.73_m2} (ref >=60)
GFR, EST NON AFRICAN AMERICAN: 83 mL/min/{1.73_m2} (ref >=60)
Glucose, Bld: 115 mg/dL — ABNORMAL HIGH (ref 65–99)
POTASSIUM: 4.5 mmol/L (ref 3.5–5.3)
Sodium: 138 mmol/L (ref 135–146)

## 2017-10-06 LAB — HEPATIC FUNCTION PANEL
AG RATIO: 2.1 (calc) (ref 1.0–2.5)
ALBUMIN MSPROF: 4.2 g/dL (ref 3.6–5.1)
ALT: 13 U/L (ref 9–46)
AST: 16 U/L (ref 10–35)
Alkaline phosphatase (APISO): 57 U/L (ref 40–115)
BILIRUBIN DIRECT: 0.1 mg/dL (ref 0.0–0.2)
BILIRUBIN TOTAL: 0.6 mg/dL (ref 0.2–1.2)
GLOBULIN: 2 g/dL (ref 1.9–3.7)
Indirect Bilirubin: 0.5 mg/dL (calc) (ref 0.2–1.2)
Total Protein: 6.2 g/dL (ref 6.1–8.1)

## 2017-10-06 LAB — HEPATITIS C ANTIBODY
Hepatitis C Ab: NONREACTIVE
SIGNAL TO CUT-OFF: 0.02 (ref <1.00)

## 2017-10-06 LAB — HEMOGLOBIN A1C
HEMOGLOBIN A1C: 7.3 %{Hb} — AB (ref <5.7)
MEAN PLASMA GLUCOSE: 163 (calc)
eAG (mmol/L): 9 (calc)

## 2017-10-06 LAB — MICROALBUMIN, URINE: Microalb, Ur: 5.2 mg/dL

## 2017-10-06 LAB — PSA: PSA: 0.4 ng/mL (ref <=4.0)

## 2017-11-16 ENCOUNTER — Telehealth: Payer: Self-pay | Admitting: Family Medicine

## 2017-11-16 MED ORDER — PENICILLIN V POTASSIUM 500 MG PO TABS: 500.0000 mg | ORAL_TABLET | Freq: Four times a day (QID) | ORAL | 0 refills | Status: DC

## 2017-11-16 NOTE — Telephone Encounter (Signed)
Pt has an abscessed tooth and is needing an antibiotic called in.    [facility]

## 2017-11-16 NOTE — Telephone Encounter (Signed)
Penicillin VK 500 mg 1 4 times daily for the next 7 days, patient should also scheduled to see his dentist

## 2017-11-16 NOTE — Telephone Encounter (Signed)
Prescription sent electronically to pharmacy. Patient notified and advised to follow up with his dentist.

## 2017-11-24 ENCOUNTER — Ambulatory Visit: Payer: Medicare HMO | Admitting: Family Medicine

## 2017-11-28 ENCOUNTER — Ambulatory Visit (INDEPENDENT_AMBULATORY_CARE_PROVIDER_SITE_OTHER): Payer: Medicare HMO | Admitting: Family Medicine

## 2017-11-28 ENCOUNTER — Encounter: Payer: Self-pay | Admitting: Family Medicine

## 2017-11-28 VITALS — BP 124/70 | Ht 75.0 in | Wt 266.0 lb

## 2017-11-28 DIAGNOSIS — G894 Chronic pain syndrome: Secondary | ICD-10-CM | POA: Diagnosis not present

## 2017-11-28 DIAGNOSIS — F1190 Opioid use, unspecified, uncomplicated: Secondary | ICD-10-CM | POA: Diagnosis not present

## 2017-11-28 DIAGNOSIS — Z79891 Long term (current) use of opiate analgesic: Secondary | ICD-10-CM | POA: Diagnosis not present

## 2017-11-28 DIAGNOSIS — E782 Mixed hyperlipidemia: Secondary | ICD-10-CM | POA: Diagnosis not present

## 2017-11-28 DIAGNOSIS — E1142 Type 2 diabetes mellitus with diabetic polyneuropathy: Secondary | ICD-10-CM

## 2017-11-28 DIAGNOSIS — Z794 Long term (current) use of insulin: Secondary | ICD-10-CM

## 2017-11-28 DIAGNOSIS — R69 Illness, unspecified: Secondary | ICD-10-CM | POA: Diagnosis not present

## 2017-11-28 DIAGNOSIS — E1140 Type 2 diabetes mellitus with diabetic neuropathy, unspecified: Secondary | ICD-10-CM

## 2017-11-28 MED ORDER — OXYCODONE-ACETAMINOPHEN 10-325 MG PO TABS: 1.0000 | ORAL_TABLET | ORAL | 0 refills | Status: DC | PRN

## 2017-11-28 MED ORDER — ROSUVASTATIN CALCIUM 5 MG PO TABS: 5.0000 mg | ORAL_TABLET | Freq: Every day | ORAL | 1 refills | Status: DC

## 2017-11-28 NOTE — Progress Notes (Signed)
Subjective:    Patient ID: [name], male    DOB: 19-Nov-1955, 62 y.o.   MRN: [id number]  HPI Patient is here today to follow up on Dm.Last A1c was 10/05/2017 at 7.3.Currently on Levemir 52 units QHS. He see Dr.Mckinny Podiatrist.  The patient was seen today as part of a comprehensive diabetic check up.The patient relates medication compliance. No significant side effects to the medications. Denies any low glucose spells. Relates compliance with diet to a reasonable level. Patient does do labwork intermittently and understands the dangers of diabetes.  Patient here for follow-up regarding cholesterol.  Patient does try to maintain a reasonable diet.  Patient does take the medication on a regular basis.  Denies missing a dose.  The patient denies any obvious side effects.  Prior blood work results reviewed with the patient.  The patient is aware of his cholesterol goals and the need to keep it under good control to lessen the risk of disease.    This patient was seen today for chronic pain  The medication list was reviewed and updated.   -Compliance with medication: 4 -5 daily  - Number patient states they take daily:   -when was the last dose patient took?This am at 7 am  The patient was advised the importance of maintaining medication and not using illegal substances with these.  Here for refills and follow up  The patient was educated that we can provide 3 monthly scripts for their medication, it is their responsibility to follow the instructions.  Side effects or complications from medications: No  Patient is aware that pain medications are meant to minimize the severity of the pain to allow their pain levels to improve to allow for better function. They are aware of that pain medications cannot totally remove their pain.  Due for UDT ( at least once per year) : Due today  Results for orders placed or performed in visit on 10/05/17  Microalbumin, urine  Result Value Ref  Range   Microalb, Ur 5.2 mg/dL   RAM    Lipid panel  Result Value Ref Range   Cholesterol 117 <200 mg/dL   HDL 33 (L) >40 mg/dL   Triglycerides 68 <150 mg/dL   LDL Cholesterol (Calc) 70 mg/dL (calc)   Total CHOL/HDL Ratio 3.5 <5.0 (calc)   Non-HDL Cholesterol (Calc) 84 <130 mg/dL (calc)  BASIC METABOLIC PANEL WITH GFR  Result Value Ref Range   Glucose, Bld 115 (H) 65 - 99 mg/dL   BUN 23 7 - 25 mg/dL   Creat 0.97 0.70 - 1.25 mg/dL   GFR, Est Non African American 83 > OR = 60 mL/min/1.6m2   GFR, Est African American 97 > OR = 60 mL/min/1.69m2   BUN/Creatinine Ratio NOT APPLICABLE 6 - 22 (calc)   Sodium 138 135 - 146 mmol/L   Potassium 4.5 3.5 - 5.3 mmol/L   Chloride 102 98 - 110 mmol/L   CO2 27 20 - 32 mmol/L   Calcium 9.3 8.6 - 10.3 mg/dL  Hemoglobin A1c  Result Value Ref Range   Hgb A1c MFr Bld 7.3 (H) <5.7 % of total Hgb   Mean Plasma Glucose 163 (calc)   eAG (mmol/L) 9.0 (calc)  Hepatic function panel  Result Value Ref Range   Total Protein 6.2 6.1 - 8.1 g/dL   Albumin 4.2 3.6 - 5.1 g/dL   Globulin 2.0 1.9 - 3.7 g/dL (calc)   AG Ratio 2.1 1.0 - 2.5 (calc)   Total  Bilirubin 0.6 0.2 - 1.2 mg/dL   Bilirubin, Direct 0.1 0.0 - 0.2 mg/dL   Indirect Bilirubin 0.5 0.2 - 1.2 mg/dL (calc)   Alkaline phosphatase (APISO) 57 40 - 115 U/L   AST 16 10 - 35 U/L   ALT 13 9 - 46 U/L  Hepatitis C antibody  Result Value Ref Range   Hepatitis C Ab NON-REACTIVE NON-REACTI   SIGNAL TO CUT-OFF 0.02 <1.00  PSA  Result Value Ref Range   PSA 0.4 < OR = 4.0 ng/mL        Review of Systems  Constitutional: Negative for activity change, appetite change and fatigue.  HENT: Negative for congestion and rhinorrhea.   Respiratory: Negative for cough, chest tightness and shortness of breath.   Cardiovascular: Negative for chest pain and leg swelling.  Gastrointestinal: Negative for abdominal pain, diarrhea and nausea.  Endocrine: Negative for polydipsia and polyphagia.  Genitourinary:  Negative for dysuria and hematuria.  Musculoskeletal: Positive for arthralgias and back pain.  Neurological: Negative for weakness and headaches.  Psychiatric/Behavioral: Negative for confusion and dysphoric mood.       Objective:   Physical Exam  Constitutional: He appears well-nourished. No distress.  HENT:  Head: Normocephalic and atraumatic.  Right Ear: External ear normal.  Left Ear: External ear normal.  Eyes: Right eye exhibits no discharge. Left eye exhibits no discharge.  Neck: No tracheal deviation present.  Cardiovascular: Normal rate, regular rhythm and normal heart sounds.  No murmur heard. Pulmonary/Chest: Effort normal and breath sounds normal. No stridor. No respiratory distress. He has no wheezes.  Musculoskeletal: He exhibits no edema or tenderness.  Lymphadenopathy:    He has no cervical adenopathy.  Neurological: He is alert. He exhibits normal muscle tone.  Skin: Skin is warm. No rash noted.  Psychiatric: His behavior is normal.  Vitals reviewed.         Assessment & Plan:  The patient was seen today as part of a comprehensive visit regarding pain control. Patient's compliance with the medication as well as discussion regarding effectiveness was completed. Prescriptions were written. Patient was advised to follow-up in 3 months. The patient was assessed for any signs of severe side effects. The patient was advised to take the medicine as directed and to report to [place] if any side effect issues. Drug registry was checked pain is doing well with the medication responding well not abusing the medicine 3 prescriptions written today  The patient was seen today as part of a comprehensive visit for diabetes. The importance of keeping her A1c at or below 7 was discussed. Importance of regular physical activity was discussed. Proper monitoring of glucose levels with glucometer discussed. The importance of adherence to medication as well as a controlled low starch/sugar  diet was also discussed. Also discussion regarding the importance of diabetic foot checks including self check every day. Also yearly diabetic eye exams recommended. The importance of keeping blood pressure under control and keeping LDL below 70 was also discussed. Also the importance of avoiding smoking. Standard follow-up visit recommended. Finally failure to follow good diabetic measures including self effort and compliance with recommendations can certainly increase the risk of heart disease strokes kidney failure blindness loss of limb and early death was discussed with the patient. Diabetes could be under better control not doing a good job of exercise or diet this was discussed at length cholesterol his cholesterol profile looks good continue current measures  The patient was seen today as part of an evaluation  regarding hyperlipidemia. Recent lab work has been reviewed with the patient as well as the goals for good cholesterol care. In addition to this medications have been discussed the importance of compliance with diet and medications discussed as well. Patient has been informed of potential side effects of medications in the importance to notify us should any problems occur. Finally the patient is aware that poor control of cholesterol, noncompliance can dramatically increase her risk of heart attack strokes and premature death. The patient will keep regular office visits and the patient does agreed to periodic lab work.  Cholesterol profile looks good continue current measures  25 minutes was spent with the patient.  This statement verifies that 25 minutes was indeed spent with the patient. Greater than half the time was spent in discussion, counseling and answering questions  regarding the issues that the patient came in for today as reflected in the diagnosis (s) please refer to documentation for further details.

## 2017-12-02 LAB — TOXASSURE SELECT 13 (MW), URINE

## 2017-12-08 DIAGNOSIS — I739 Peripheral vascular disease, unspecified: Secondary | ICD-10-CM | POA: Diagnosis not present

## 2017-12-08 DIAGNOSIS — B351 Tinea unguium: Secondary | ICD-10-CM | POA: Diagnosis not present

## 2017-12-08 DIAGNOSIS — L851 Acquired keratosis [keratoderma] palmaris et plantaris: Secondary | ICD-10-CM | POA: Diagnosis not present

## 2017-12-08 DIAGNOSIS — E1142 Type 2 diabetes mellitus with diabetic polyneuropathy: Secondary | ICD-10-CM | POA: Diagnosis not present

## 2018-02-26 ENCOUNTER — Encounter: Payer: Self-pay | Admitting: Family Medicine

## 2018-02-26 ENCOUNTER — Ambulatory Visit (INDEPENDENT_AMBULATORY_CARE_PROVIDER_SITE_OTHER): Payer: Medicare HMO | Admitting: Family Medicine

## 2018-02-26 VITALS — BP 140/82 | Ht 75.0 in | Wt 252.2 lb

## 2018-02-26 DIAGNOSIS — E782 Mixed hyperlipidemia: Secondary | ICD-10-CM | POA: Diagnosis not present

## 2018-02-26 DIAGNOSIS — Z89419 Acquired absence of unspecified great toe: Secondary | ICD-10-CM | POA: Diagnosis not present

## 2018-02-26 DIAGNOSIS — I739 Peripheral vascular disease, unspecified: Secondary | ICD-10-CM

## 2018-02-26 DIAGNOSIS — Z1211 Encounter for screening for malignant neoplasm of colon: Secondary | ICD-10-CM | POA: Diagnosis not present

## 2018-02-26 DIAGNOSIS — G894 Chronic pain syndrome: Secondary | ICD-10-CM

## 2018-02-26 DIAGNOSIS — E1142 Type 2 diabetes mellitus with diabetic polyneuropathy: Secondary | ICD-10-CM | POA: Diagnosis not present

## 2018-02-26 DIAGNOSIS — E1169 Type 2 diabetes mellitus with other specified complication: Secondary | ICD-10-CM

## 2018-02-26 LAB — POCT GLYCOSYLATED HEMOGLOBIN (HGB A1C): HEMOGLOBIN A1C: 6.6

## 2018-02-26 MED ORDER — OXYCODONE-ACETAMINOPHEN 10-325 MG PO TABS: 1.0000 | ORAL_TABLET | ORAL | 0 refills | Status: DC | PRN

## 2018-02-26 MED ORDER — DAPAGLIFLOZIN PROPANEDIOL 10 MG PO TABS: 10.0000 mg | ORAL_TABLET | Freq: Every day | ORAL | 3 refills | Status: DC

## 2018-02-26 MED ORDER — ROSUVASTATIN CALCIUM 5 MG PO TABS: 5.0000 mg | ORAL_TABLET | Freq: Every day | ORAL | 1 refills | Status: DC

## 2018-02-26 MED ORDER — INSULIN DETEMIR 100 UNIT/ML FLEXPEN: PEN_INJECTOR | SUBCUTANEOUS | 5 refills | Status: DC

## 2018-02-26 NOTE — Progress Notes (Signed)
Subjective:    Patient ID: [name], male    DOB: [birth year], 62 y.o.   MRN: [id number]  HPI This patient was seen today for chronic pain  The medication list was reviewed and updated.   -Compliance with medication: yes  - Number patient states they take daily: usually 3-4; mostly during the night due to leg aches  -when was the last dose patient took? About 6 am this morning  The patient was advised the importance of maintaining medication and not using illegal substances with these.  Here for refills and follow up  The patient was educated that we can provide 3 monthly scripts for their medication, it is their responsibility to follow the instructions.  Side effects or complications from medications: none  Patient is aware that pain medications are meant to minimize the severity of the pain to allow their pain levels to improve to allow for better function. They are aware of that pain medications cannot totally remove their pain.  Due for UDT ( at least once per year) : 11/28/17  Pt also here for diabetes check. No complications.  Results for orders placed or performed in visit on 02/26/18  POCT HgB A1C  Result Value Ref Range   Hemoglobin A1C 6.6     The patient was seen today as part of a comprehensive diabetic check up.The patient relates medication compliance. No significant side effects to the medications. Denies any low glucose spells. Relates compliance with diet to a reasonable level. Patient does do labwork intermittently and understands the dangers of diabetes.  Patient here for follow-up regarding cholesterol.  Patient does try to maintain a reasonable diet.  Patient does take the medication on a regular basis.  Denies missing a dose.  The patient denies any obvious side effects.  Prior blood work results reviewed with the patient.  The patient is aware of his cholesterol goals and the need to keep it under good control to lessen the risk of disease.  Patient  with previous ulcerations and amputation of foot states he is doing a good job watching his feet  Patient with history of peripheral vascular disease denies any muscle pain currently  Review of Systems  Constitutional: Negative for activity change, appetite change and fatigue.  HENT: Negative for congestion and rhinorrhea.   Respiratory: Negative for cough, chest tightness and shortness of breath.   Cardiovascular: Negative for chest pain and leg swelling.  Gastrointestinal: Negative for abdominal pain, diarrhea and nausea.  Endocrine: Negative for polydipsia and polyphagia.  Genitourinary: Negative for dysuria and hematuria.  Neurological: Negative for weakness and headaches.  Psychiatric/Behavioral: Negative for confusion and dysphoric mood.       Objective:   Physical Exam  Constitutional: He appears well-nourished. No distress.  HENT:  Head: Normocephalic and atraumatic.  Eyes: Right eye exhibits no discharge. Left eye exhibits no discharge.  Neck: No tracheal deviation present.  Cardiovascular: Normal rate, regular rhythm and normal heart sounds.  No murmur heard. Pulmonary/Chest: Effort normal and breath sounds normal. No respiratory distress.  Abdominal: Soft. He exhibits no distension. There is no tenderness.  Musculoskeletal: He exhibits no edema or tenderness.  Lymphadenopathy:    He has no cervical adenopathy.  Neurological: He is alert. He exhibits normal muscle tone.  Skin: Skin is warm and dry.  Psychiatric: His behavior is normal.  Vitals reviewed.         Assessment & Plan:  The patient was seen in followup for chronic pain. A  review over at their current pain status was discussed. Discussion was held regarding the importance of compliance with medication as well as pain medication contract. Discussion with the patient regarding the medication as it pertains to allowing for blunting of pain levels as well as improvement of function was  completed. Questions regarding the pain management were answered. Importance of regular followup visits was discussed. Patient was informed that medication may cause drowsiness and should not be combined  with other medications/alcohol or street drugs. Patient was cautioned that drowsiness may impair ability to operate heavy machinery/vehicles/dangerous activities and should take this into consideration.  Drug registry was checked 3 prescriptions given  The patient was seen today as part of an evaluation regarding hyperlipidemia.  Recent lab work has been reviewed with the patient as well as the goals for good cholesterol care.  In addition to this medications have been discussed the importance of compliance with diet and medications discussed as well.  Finally the patient is aware that poor control of cholesterol, noncompliance can dramatically increase her risk of heart attack strokes and premature death.  The patient will keep regular office visits and the patient does agreed to periodic lab work.  Peripheral vascular disease no sign of any active ulcers or infections  History of great toe amputation on the right side has callus sees podiatry on a regular basis  Due for screening for colon cancer referral to gastroenterology Dr. Oneida Alar office  Diabetic peripheral neuropathy patient was counseled regarding proper foot care foot exam was completed today  Significant osteoarthritis of the knees along with significant back pain on pain medicine helps him function  25 minutes was spent with the patient.  This statement verifies that 25 minutes was indeed spent with the patient. Greater than half the time was spent in discussion, counseling and answering questions  regarding the issues that the patient came in for today as reflected in the diagnosis (s) please refer to documentation for further details.

## 2018-03-05 ENCOUNTER — Encounter (INDEPENDENT_AMBULATORY_CARE_PROVIDER_SITE_OTHER): Payer: Self-pay

## 2018-03-05 ENCOUNTER — Encounter: Payer: Self-pay | Admitting: Family Medicine

## 2018-03-15 ENCOUNTER — Encounter: Payer: Self-pay | Admitting: Gastroenterology

## 2018-05-29 ENCOUNTER — Encounter: Payer: Self-pay | Admitting: Family Medicine

## 2018-05-29 ENCOUNTER — Ambulatory Visit (INDEPENDENT_AMBULATORY_CARE_PROVIDER_SITE_OTHER): Payer: Medicare HMO | Admitting: Family Medicine

## 2018-05-29 VITALS — BP 120/72 | Ht 75.0 in | Wt 250.2 lb

## 2018-05-29 DIAGNOSIS — E782 Mixed hyperlipidemia: Secondary | ICD-10-CM

## 2018-05-29 DIAGNOSIS — R5383 Other fatigue: Secondary | ICD-10-CM | POA: Diagnosis not present

## 2018-05-29 DIAGNOSIS — G894 Chronic pain syndrome: Secondary | ICD-10-CM | POA: Diagnosis not present

## 2018-05-29 DIAGNOSIS — Z23 Encounter for immunization: Secondary | ICD-10-CM | POA: Diagnosis not present

## 2018-05-29 DIAGNOSIS — Z79899 Other long term (current) drug therapy: Secondary | ICD-10-CM | POA: Diagnosis not present

## 2018-05-29 DIAGNOSIS — Z794 Long term (current) use of insulin: Secondary | ICD-10-CM

## 2018-05-29 DIAGNOSIS — E1140 Type 2 diabetes mellitus with diabetic neuropathy, unspecified: Secondary | ICD-10-CM | POA: Diagnosis not present

## 2018-05-29 DIAGNOSIS — Z125 Encounter for screening for malignant neoplasm of prostate: Secondary | ICD-10-CM

## 2018-05-29 LAB — POCT GLYCOSYLATED HEMOGLOBIN (HGB A1C): Hemoglobin A1C: 6.4 % — AB (ref 4.0–5.6)

## 2018-05-29 MED ORDER — OXYCODONE-ACETAMINOPHEN 10-325 MG PO TABS: 1.0000 | ORAL_TABLET | ORAL | 0 refills | Status: DC | PRN

## 2018-05-29 NOTE — Progress Notes (Signed)
Subjective:    Patient ID: [name], male    DOB: [birth year], 62 y.o.   MRN: [id number]  Diabetes  He presents for his follow-up diabetic visit. He has type 2 diabetes mellitus. There are no hypoglycemic associated symptoms. Pertinent negatives for hypoglycemia include no confusion, dizziness or headaches. There are no diabetic associated symptoms. Pertinent negatives for diabetes include no chest pain and no fatigue. There are no hypoglycemic complications. There are no diabetic complications.   Results for orders placed or performed in visit on 05/29/18  POCT HgB A1C  Result Value Ref Range   Hemoglobin A1C 6.4 (A) 4.0 - 5.6 %   HbA1c POC (<> result, manual entry)     HbA1c, POC (prediabetic range)     HbA1c, POC (controlled diabetic range)     This patient was seen today for chronic pain  The medication list was reviewed and updated.   -Compliance with medication: yes  - Number patient states they take daily:3-4  -when was the last dose patient took? About 5 am  The patient was advised the importance of maintaining medication and not using illegal substances with these.  Here for refills and follow up  The patient was educated that we can provide 3 monthly scripts for their medication, it is their responsibility to follow the instructions.  Side effects or complications from medications: none  Patient is aware that pain medications are meant to minimize the severity of the pain to allow their pain levels to improve to allow for better function. They are aware of that pain medications cannot totally remove their pain.  Due for UDT ( at least once per year) : last UDT done 11/28/2017       Review of Systems  Constitutional: Negative for diaphoresis and fatigue.  HENT: Negative for congestion and rhinorrhea.   Respiratory: Negative for cough and shortness of breath.   Cardiovascular: Negative for chest pain and leg swelling.  Gastrointestinal: Negative for  abdominal pain and diarrhea.  Skin: Negative for color change and rash.  Neurological: Negative for dizziness and headaches.  Psychiatric/Behavioral: Negative for behavioral problems and confusion.       Objective:   Physical Exam  Constitutional: He appears well-nourished. No distress.  HENT:  Head: Normocephalic and atraumatic.  Eyes: Right eye exhibits no discharge. Left eye exhibits no discharge.  Neck: No tracheal deviation present.  Cardiovascular: Normal rate, regular rhythm and normal heart sounds.  No murmur heard. Pulmonary/Chest: Effort normal and breath sounds normal. No respiratory distress.  Musculoskeletal: He exhibits no edema.  Lymphadenopathy:    He has no cervical adenopathy.  Neurological: He is alert. Coordination normal.  Skin: Skin is warm and dry.  Psychiatric: He has a normal mood and affect. His behavior is normal.  Vitals reviewed.         Assessment & Plan:  The patient was seen in followup for chronic pain. A review over at their current pain status was discussed. Drug registry was checked. Prescriptions were given. Discussion was held regarding the importance of compliance with medication as well as pain medication contract.  Time for questions regarding pain management plan occurred. Importance of regular followup visits was discussed. Patient was informed that medication may cause drowsiness and should not be combined  with other medications/alcohol or street drugs. Patient was cautioned that medication could cause drowsiness. If the patient feels medication is causing altered alertness then do not drive or operate dangerous equipment.  The patient was seen  today as part of a comprehensive visit for diabetes. The importance of keeping her A1c at or below 7 was discussed.  Importance of regular physical activity was discussed.   The importance of adherence to medication as well as a controlled low starch/sugar diet was also discussed.    Standard follow-up visit recommended.  Also patient aware failure to keep diabetes under control increases the risk of complications.  The patient was seen today as part of an evaluation regarding hyperlipidemia.  Recent lab work has been reviewed with the patient as well as the goals for good cholesterol care.  In addition to this medications have been discussed the importance of compliance with diet and medications discussed as well.  Finally the patient is aware that poor control of cholesterol, noncompliance can dramatically increase the risk of complications. The patient will keep regular office visits and the patient does agreed to periodic lab work.  Keep all regular visits and follow-ups Follow-up in 3 months Lab work to be completed before next visit

## 2018-06-13 ENCOUNTER — Ambulatory Visit: Payer: BLUE CROSS/BLUE SHIELD | Admitting: Nurse Practitioner

## 2018-07-31 ENCOUNTER — Encounter: Payer: Self-pay | Admitting: Gastroenterology

## 2018-08-28 ENCOUNTER — Telehealth: Payer: Self-pay | Admitting: Family Medicine

## 2018-08-28 NOTE — Telephone Encounter (Signed)
Pt lost lab orders and would like to have them reprinted so he can pick them up.

## 2018-08-28 NOTE — Telephone Encounter (Signed)
Labs reprinted and patient is aware. Labs up front to be picked up

## 2018-08-29 ENCOUNTER — Ambulatory Visit: Payer: Medicare HMO | Admitting: Family Medicine

## 2018-09-10 ENCOUNTER — Other Ambulatory Visit: Payer: Self-pay | Admitting: Family Medicine

## 2018-09-10 DIAGNOSIS — Z125 Encounter for screening for malignant neoplasm of prostate: Secondary | ICD-10-CM | POA: Diagnosis not present

## 2018-09-10 DIAGNOSIS — E782 Mixed hyperlipidemia: Secondary | ICD-10-CM | POA: Diagnosis not present

## 2018-09-10 DIAGNOSIS — Z79899 Other long term (current) drug therapy: Secondary | ICD-10-CM | POA: Diagnosis not present

## 2018-09-10 DIAGNOSIS — R5383 Other fatigue: Secondary | ICD-10-CM | POA: Diagnosis not present

## 2018-09-11 LAB — HEPATIC FUNCTION PANEL
AG Ratio: 2 (calc) (ref 1.0–2.5)
ALKALINE PHOSPHATASE (APISO): 73 U/L (ref 40–115)
ALT: 16 U/L (ref 9–46)
AST: 16 U/L (ref 10–35)
Albumin: 4.5 g/dL (ref 3.6–5.1)
BILIRUBIN DIRECT: 0.1 mg/dL (ref 0.0–0.2)
BILIRUBIN TOTAL: 0.5 mg/dL (ref 0.2–1.2)
Globulin: 2.3 g/dL (calc) (ref 1.9–3.7)
Indirect Bilirubin: 0.4 mg/dL (calc) (ref 0.2–1.2)
Total Protein: 6.8 g/dL (ref 6.1–8.1)

## 2018-09-11 LAB — BASIC METABOLIC PANEL WITH GFR
BUN: 22 mg/dL (ref 7–25)
CO2: 26 mmol/L (ref 20–32)
CREATININE: 0.85 mg/dL (ref 0.70–1.25)
Calcium: 9.5 mg/dL (ref 8.6–10.3)
Chloride: 101 mmol/L (ref 98–110)
GFR, EST NON AFRICAN AMERICAN: 93 mL/min/{1.73_m2} (ref >=60)
GFR, Est African American: 107 mL/min/{1.73_m2} (ref >=60)
Glucose, Bld: 221 mg/dL — ABNORMAL HIGH (ref 65–99)
POTASSIUM: 4.6 mmol/L (ref 3.5–5.3)
SODIUM: 137 mmol/L (ref 135–146)

## 2018-09-11 LAB — LIPID PANEL
CHOLESTEROL: 143 mg/dL (ref <200)
HDL: 37 mg/dL — ABNORMAL LOW (ref >40)
LDL CHOLESTEROL (CALC): 84 mg/dL
Non-HDL Cholesterol (Calc): 106 mg/dL (calc) (ref <130)
TRIGLYCERIDES: 123 mg/dL (ref <150)
Total CHOL/HDL Ratio: 3.9 (calc) (ref <5.0)

## 2018-09-11 LAB — PSA: PSA: 0.4 ng/mL (ref <=4.0)

## 2018-09-17 ENCOUNTER — Ambulatory Visit: Payer: Medicare HMO | Admitting: Family Medicine

## 2018-09-18 ENCOUNTER — Emergency Department (HOSPITAL_COMMUNITY): Payer: Medicare HMO

## 2018-09-18 ENCOUNTER — Other Ambulatory Visit: Payer: Self-pay

## 2018-09-18 ENCOUNTER — Encounter: Payer: Self-pay | Admitting: Family Medicine

## 2018-09-18 ENCOUNTER — Inpatient Hospital Stay (HOSPITAL_COMMUNITY): Payer: Medicare HMO

## 2018-09-18 ENCOUNTER — Encounter (HOSPITAL_COMMUNITY): Payer: Self-pay

## 2018-09-18 ENCOUNTER — Inpatient Hospital Stay (HOSPITAL_COMMUNITY)
Admission: EM | Admit: 2018-09-18 | Discharge: 2018-09-19 | DRG: 065 | Disposition: A | Discharge status: Home / Self Care | Payer: Medicare HMO | Source: Ambulatory Visit | Attending: Internal Medicine | Admitting: Internal Medicine

## 2018-09-18 ENCOUNTER — Ambulatory Visit (INDEPENDENT_AMBULATORY_CARE_PROVIDER_SITE_OTHER): Payer: Medicare HMO | Admitting: Family Medicine

## 2018-09-18 VITALS — BP 158/80 | HR 87 | Ht 75.0 in | Wt 250.0 lb

## 2018-09-18 DIAGNOSIS — R471 Dysarthria and anarthria: Secondary | ICD-10-CM | POA: Diagnosis present

## 2018-09-18 DIAGNOSIS — E7800 Pure hypercholesterolemia, unspecified: Secondary | ICD-10-CM | POA: Diagnosis not present

## 2018-09-18 DIAGNOSIS — Z809 Family history of malignant neoplasm, unspecified: Secondary | ICD-10-CM

## 2018-09-18 DIAGNOSIS — I639 Cerebral infarction, unspecified: Principal | ICD-10-CM | POA: Diagnosis present

## 2018-09-18 DIAGNOSIS — M1990 Unspecified osteoarthritis, unspecified site: Secondary | ICD-10-CM | POA: Diagnosis not present

## 2018-09-18 DIAGNOSIS — M109 Gout, unspecified: Secondary | ICD-10-CM | POA: Diagnosis present

## 2018-09-18 DIAGNOSIS — I10 Essential (primary) hypertension: Secondary | ICD-10-CM | POA: Diagnosis not present

## 2018-09-18 DIAGNOSIS — G8191 Hemiplegia, unspecified affecting right dominant side: Secondary | ICD-10-CM | POA: Diagnosis present

## 2018-09-18 DIAGNOSIS — I6350 Cerebral infarction due to unspecified occlusion or stenosis of unspecified cerebral artery: Secondary | ICD-10-CM

## 2018-09-18 DIAGNOSIS — Z7982 Long term (current) use of aspirin: Secondary | ICD-10-CM | POA: Diagnosis not present

## 2018-09-18 DIAGNOSIS — I2510 Atherosclerotic heart disease of native coronary artery without angina pectoris: Secondary | ICD-10-CM | POA: Diagnosis not present

## 2018-09-18 DIAGNOSIS — Z833 Family history of diabetes mellitus: Secondary | ICD-10-CM | POA: Diagnosis not present

## 2018-09-18 DIAGNOSIS — I5022 Chronic systolic (congestive) heart failure: Secondary | ICD-10-CM | POA: Diagnosis present

## 2018-09-18 DIAGNOSIS — K118 Other diseases of salivary glands: Secondary | ICD-10-CM

## 2018-09-18 DIAGNOSIS — R011 Cardiac murmur, unspecified: Secondary | ICD-10-CM | POA: Diagnosis present

## 2018-09-18 DIAGNOSIS — Z888 Allergy status to other drugs, medicaments and biological substances status: Secondary | ICD-10-CM

## 2018-09-18 DIAGNOSIS — G894 Chronic pain syndrome: Secondary | ICD-10-CM | POA: Diagnosis not present

## 2018-09-18 DIAGNOSIS — F1210 Cannabis abuse, uncomplicated: Secondary | ICD-10-CM | POA: Diagnosis not present

## 2018-09-18 DIAGNOSIS — R4701 Aphasia: Secondary | ICD-10-CM | POA: Diagnosis present

## 2018-09-18 DIAGNOSIS — E785 Hyperlipidemia, unspecified: Secondary | ICD-10-CM | POA: Diagnosis not present

## 2018-09-18 DIAGNOSIS — I63233 Cerebral infarction due to unspecified occlusion or stenosis of bilateral carotid arteries: Secondary | ICD-10-CM | POA: Diagnosis not present

## 2018-09-18 DIAGNOSIS — Z885 Allergy status to narcotic agent status: Secondary | ICD-10-CM | POA: Diagnosis not present

## 2018-09-18 DIAGNOSIS — E1151 Type 2 diabetes mellitus with diabetic peripheral angiopathy without gangrene: Secondary | ICD-10-CM | POA: Diagnosis present

## 2018-09-18 DIAGNOSIS — Z794 Long term (current) use of insulin: Secondary | ICD-10-CM

## 2018-09-18 DIAGNOSIS — R29810 Facial weakness: Secondary | ICD-10-CM | POA: Diagnosis present

## 2018-09-18 DIAGNOSIS — R270 Ataxia, unspecified: Secondary | ICD-10-CM

## 2018-09-18 DIAGNOSIS — R531 Weakness: Secondary | ICD-10-CM

## 2018-09-18 DIAGNOSIS — K5903 Drug induced constipation: Secondary | ICD-10-CM | POA: Diagnosis present

## 2018-09-18 DIAGNOSIS — Z8249 Family history of ischemic heart disease and other diseases of the circulatory system: Secondary | ICD-10-CM

## 2018-09-18 DIAGNOSIS — I63412 Cerebral infarction due to embolism of left middle cerebral artery: Secondary | ICD-10-CM | POA: Diagnosis not present

## 2018-09-18 DIAGNOSIS — Z87891 Personal history of nicotine dependence: Secondary | ICD-10-CM | POA: Diagnosis not present

## 2018-09-18 DIAGNOSIS — Z955 Presence of coronary angioplasty implant and graft: Secondary | ICD-10-CM | POA: Diagnosis not present

## 2018-09-18 DIAGNOSIS — I252 Old myocardial infarction: Secondary | ICD-10-CM | POA: Diagnosis not present

## 2018-09-18 DIAGNOSIS — R479 Unspecified speech disturbances: Secondary | ICD-10-CM | POA: Diagnosis not present

## 2018-09-18 DIAGNOSIS — I110 Hypertensive heart disease with heart failure: Secondary | ICD-10-CM | POA: Diagnosis not present

## 2018-09-18 DIAGNOSIS — R29703 NIHSS score 3: Secondary | ICD-10-CM | POA: Diagnosis present

## 2018-09-18 DIAGNOSIS — E1165 Type 2 diabetes mellitus with hyperglycemia: Secondary | ICD-10-CM | POA: Diagnosis not present

## 2018-09-18 DIAGNOSIS — T40605A Adverse effect of unspecified narcotics, initial encounter: Secondary | ICD-10-CM | POA: Diagnosis present

## 2018-09-18 DIAGNOSIS — R69 Illness, unspecified: Secondary | ICD-10-CM | POA: Diagnosis not present

## 2018-09-18 DIAGNOSIS — I440 Atrioventricular block, first degree: Secondary | ICD-10-CM | POA: Diagnosis present

## 2018-09-18 LAB — URINALYSIS, ROUTINE W REFLEX MICROSCOPIC
Bacteria, UA: NONE SEEN
Bilirubin Urine: NEGATIVE
Glucose, UA: >=500 mg/dL — AB
Hgb urine dipstick: NEGATIVE
Ketones, ur: 20 mg/dL — AB
Leukocytes, UA: NEGATIVE
Nitrite: NEGATIVE
PROTEIN: NEGATIVE mg/dL
Specific Gravity, Urine: 1.022 (ref 1.005–1.030)
pH: 5 (ref 5.0–8.0)

## 2018-09-18 LAB — COMPREHENSIVE METABOLIC PANEL
ALT: 21 U/L (ref 0–44)
AST: 23 U/L (ref 15–41)
Albumin: 4.6 g/dL (ref 3.5–5.0)
Alkaline Phosphatase: 59 U/L (ref 38–126)
Anion gap: 11 (ref 5–15)
BUN: 17 mg/dL (ref 8–23)
CHLORIDE: 103 mmol/L (ref 98–111)
CO2: 23 mmol/L (ref 22–32)
Calcium: 9.2 mg/dL (ref 8.9–10.3)
Creatinine, Ser: 0.96 mg/dL (ref 0.61–1.24)
GFR calc Af Amer: >60 mL/min (ref >60)
GFR calc non Af Amer: >60 mL/min (ref >60)
Glucose, Bld: 189 mg/dL — ABNORMAL HIGH (ref 70–99)
Potassium: 3.8 mmol/L (ref 3.5–5.1)
Sodium: 137 mmol/L (ref 135–145)
Total Bilirubin: 0.9 mg/dL (ref 0.3–1.2)
Total Protein: 8 g/dL (ref 6.5–8.1)

## 2018-09-18 LAB — GLUCOSE, CAPILLARY
Glucose-Capillary: 119 mg/dL — ABNORMAL HIGH (ref 70–99)
Glucose-Capillary: 140 mg/dL — ABNORMAL HIGH (ref 70–99)

## 2018-09-18 LAB — DIFFERENTIAL
ABS IMMATURE GRANULOCYTES: 0.02 10*3/uL (ref 0.00–0.07)
Basophils Absolute: 0.1 10*3/uL (ref 0.0–0.1)
Basophils Relative: 1 %
Eosinophils Absolute: 0.2 10*3/uL (ref 0.0–0.5)
Eosinophils Relative: 2 %
Immature Granulocytes: 0 %
Lymphocytes Relative: 15 %
Lymphs Abs: 1.4 10*3/uL (ref 0.7–4.0)
Monocytes Absolute: 0.8 10*3/uL (ref 0.1–1.0)
Monocytes Relative: 9 %
Neutro Abs: 6.6 10*3/uL (ref 1.7–7.7)
Neutrophils Relative %: 73 %

## 2018-09-18 LAB — ECHOCARDIOGRAM COMPLETE
Height: 75 in
Weight: 4024 oz

## 2018-09-18 LAB — RAPID URINE DRUG SCREEN, HOSP PERFORMED
Amphetamines: NOT DETECTED
BENZODIAZEPINES: NOT DETECTED
Barbiturates: NOT DETECTED
Cocaine: NOT DETECTED
Opiates: POSITIVE — AB
Tetrahydrocannabinol: POSITIVE — AB

## 2018-09-18 LAB — CBC
HCT: 42.3 % (ref 39.0–52.0)
Hemoglobin: 13.7 g/dL (ref 13.0–17.0)
MCH: 28.3 pg (ref 26.0–34.0)
MCHC: 32.4 g/dL (ref 30.0–36.0)
MCV: 87.4 fL (ref 80.0–100.0)
Platelets: 270 10*3/uL (ref 150–400)
RBC: 4.84 MIL/uL (ref 4.22–5.81)
RDW: 12.4 % (ref 11.5–15.5)
WBC: 8.9 10*3/uL (ref 4.0–10.5)
nRBC: 0 % (ref 0.0–0.2)

## 2018-09-18 LAB — I-STAT TROPONIN, ED: TROPONIN I, POC: 0 ng/mL (ref 0.00–0.08)

## 2018-09-18 LAB — PROTIME-INR
INR: 1.01
PROTHROMBIN TIME: 13.2 s (ref 11.4–15.2)

## 2018-09-18 LAB — CBG MONITORING, ED: Glucose-Capillary: 184 mg/dL — ABNORMAL HIGH (ref 70–99)

## 2018-09-18 LAB — ETHANOL: Alcohol, Ethyl (B): <10 mg/dL (ref <10)

## 2018-09-18 LAB — APTT: aPTT: 29 seconds (ref 24–36)

## 2018-09-18 MED ORDER — PERFLUTREN LIPID MICROSPHERE
1.0000 mL | INTRAVENOUS | Status: AC | PRN
  Administered 2018-09-18: 2 mL via INTRAVENOUS
  Filled 2018-09-18: qty 10

## 2018-09-18 MED ORDER — SODIUM CHLORIDE 0.9 % IV SOLN
INTRAVENOUS | Status: AC
  Administered 2018-09-18: 20:00:00 via INTRAVENOUS

## 2018-09-18 MED ORDER — INSULIN ASPART 100 UNIT/ML ~~LOC~~ SOLN
0.0000 [IU] | Freq: Three times a day (TID) | SUBCUTANEOUS | Status: DC
  Administered 2018-09-19: 2 [IU] via SUBCUTANEOUS

## 2018-09-18 MED ORDER — ACETAMINOPHEN 160 MG/5ML PO SOLN: 650.0000 mg | ORAL | Status: DC | PRN

## 2018-09-18 MED ORDER — ACETAMINOPHEN 325 MG PO TABS
650.0000 mg | ORAL_TABLET | ORAL | Status: DC | PRN
  Administered 2018-09-19: 650 mg via ORAL
  Filled 2018-09-18: qty 2

## 2018-09-18 MED ORDER — ROSUVASTATIN CALCIUM 10 MG PO TABS
5.0000 mg | ORAL_TABLET | Freq: Every day | ORAL | Status: DC
  Administered 2018-09-18: 5 mg via ORAL
  Filled 2018-09-18: qty 1

## 2018-09-18 MED ORDER — ASPIRIN 300 MG RE SUPP: 300.0000 mg | Freq: Every day | RECTAL | Status: DC

## 2018-09-18 MED ORDER — HEPARIN SODIUM (PORCINE) 5000 UNIT/ML IJ SOLN
5000.0000 [IU] | Freq: Three times a day (TID) | INTRAMUSCULAR | Status: DC
  Administered 2018-09-18 – 2018-09-19 (×3): 5000 [IU] via SUBCUTANEOUS
  Filled 2018-09-18 (×3): qty 1

## 2018-09-18 MED ORDER — STROKE: EARLY STAGES OF RECOVERY BOOK
Freq: Once | Status: AC
  Administered 2018-09-18: 17:00:00
  Filled 2018-09-18 (×2): qty 1

## 2018-09-18 MED ORDER — ONDANSETRON HCL 4 MG/2ML IJ SOLN
4.0000 mg | Freq: Once | INTRAMUSCULAR | Status: AC
  Administered 2018-09-18: 4 mg via INTRAVENOUS
  Filled 2018-09-18: qty 2

## 2018-09-18 MED ORDER — OXYCODONE HCL 5 MG PO TABS
10.0000 mg | ORAL_TABLET | Freq: Four times a day (QID) | ORAL | Status: DC | PRN
  Administered 2018-09-18 – 2018-09-19 (×3): 10 mg via ORAL
  Filled 2018-09-18 (×4): qty 2

## 2018-09-18 MED ORDER — MORPHINE SULFATE (PF) 2 MG/ML IV SOLN
2.0000 mg | Freq: Once | INTRAVENOUS | Status: AC
  Administered 2018-09-18: 2 mg via INTRAVENOUS
  Filled 2018-09-18: qty 1

## 2018-09-18 MED ORDER — ASPIRIN 325 MG PO TABS
325.0000 mg | ORAL_TABLET | Freq: Every day | ORAL | Status: DC
  Administered 2018-09-18: 325 mg via ORAL
  Filled 2018-09-18: qty 1

## 2018-09-18 MED ORDER — INSULIN ASPART 100 UNIT/ML ~~LOC~~ SOLN: 0.0000 [IU] | Freq: Every day | SUBCUTANEOUS | Status: DC

## 2018-09-18 MED ORDER — HYDRALAZINE HCL 20 MG/ML IJ SOLN: 10.0000 mg | Freq: Three times a day (TID) | INTRAMUSCULAR | Status: DC | PRN

## 2018-09-18 MED ORDER — ACETAMINOPHEN 650 MG RE SUPP: 650.0000 mg | RECTAL | Status: DC | PRN

## 2018-09-18 MED ORDER — INSULIN GLARGINE 100 UNIT/ML ~~LOC~~ SOLN
25.0000 [IU] | Freq: Every day | SUBCUTANEOUS | Status: DC
  Administered 2018-09-18: 25 [IU] via SUBCUTANEOUS
  Filled 2018-09-18 (×2): qty 0.25

## 2018-09-18 MED ORDER — SENNOSIDES-DOCUSATE SODIUM 8.6-50 MG PO TABS
1.0000 | ORAL_TABLET | Freq: Every evening | ORAL | Status: DC | PRN
  Filled 2018-09-18: qty 1

## 2018-09-18 NOTE — ED Notes (Signed)
Pt returned from MRI °

## 2018-09-18 NOTE — Progress Notes (Signed)
*  PRELIMINARY RESULTS* Echocardiogram 2D Echocardiogram with definity has been performed.  Leavy Cella 09/18/2018, 4:20 PM

## 2018-09-18 NOTE — ED Notes (Signed)
Attempted report. Per Network engineer, RN to call back when available.

## 2018-09-18 NOTE — ED Provider Notes (Addendum)
[facility] EMERGENCY DEPARTMENT Provider Note   CSN: [id number] Arrival date & time: 09/18/18  0848     History   Chief Complaint Chief Complaint  Patient presents with  . Aphasia    HPI [name] is a 62 y.o. male with a history of CAD, CHF, diabetes, history of MI, hypertension, hypercholesterolemia and peripheral vascular disease presenting with a 24-hour history of right sided weakness including his right face upper and lower extremities.  He states he was working yesterday morning when he started to feel "bad".  He was outdoors, and states he had to walk up a hill in order to get to his home which was very difficult.  He spent the rest of the day mostly in bed.  He feels a little bit improved today but still has weakness on his right side and difficulty eating and drinking, stating the cough he tripped out of the right side of his mouth with attempting to drink.  He also had difficulty speaking, due to his facial weakness, denies any difficulty finding the right words and, denies any confusion or difficulty understanding others during the entirety of this event.  He denies any visual changes.  He does endorse a mild headache at this time.  He was seen by his PCP this morning who promptly sent him here for further evaluation.  He has had no treatments prior to arrival.  The history is provided by the patient.    Past Medical History:  Diagnosis Date  . CAD (coronary artery disease)    status post PCI and bare metal stenting of   the proximal right coronary artery with placement of a Driver bare   metal stent.   . CHF (congestive heart failure) ([place])   . Depression   . Diabetes mellitus   . DJD (degenerative joint disease)   . First degree AV block    with intermittent second degree types I and    II heart block.   . Gout   . Heart attack ([place]) 2008  . Heart murmur 1974  . HTN (hypertension)   . Hypercholesterolemia   . Hyperlipidemia   . Myocardial infarction ([place])     . Obesity   . Oral candidiasis   . Peripheral vascular disease [facility])     Patient Active Problem List   Diagnosis Date Noted  . History of amputation of great toe ([place]) 02/20/2017  . Diabetic foot infection ([place]) 09/13/2016  . Peripheral vascular disease ([place])   . Hypercholesterolemia   . Diabetes mellitus ([place])   . Atherosclerotic PVD with ulceration ([place]) 08/06/2014  . Osteoarthritis of both knees 03/12/2014  . Hyperlipidemia 11/28/2013  . Chronic pain syndrome 11/28/2013  . Atherosclerotic peripheral vascular disease with ulceration ([place]) 11/28/2013  . Diabetic peripheral neuropathy ([place]) 11/28/2013  . Diabetic foot ulcer ([place]) 07/22/2013  . Type 2 diabetes mellitus ([place]) 02/20/2013  . Pain in joint, shoulder region 04/16/2012  . Muscle weakness (generalized) 04/16/2012  . Rotator cuff syndrome of left shoulder 04/16/2012  . Coronary atherosclerosis of native coronary artery 03/09/2011    Past Surgical History:  Procedure Laterality Date  . AMPUTATION Right 01/12/2015   Procedure: PARTIAL AMPUTATION 1ST RAY RIGHT FOOT (Amputation of right great toe and portion of the first metatarsal bone right foot);  Surgeon: Caprice Beaver, DPM;  Location: [facility];  Service: Podiatry;  Laterality: Right;  . AMPUTATION Right 09/14/2016   Procedure: PARTIAL AMPUTATION RAY 2ND TOE RIGHT FOOT;  Surgeon: Caprice Beaver, DPM;  Location: [facility];  Service: Podiatry;  Laterality: Right;  . COLONOSCOPY  08/2008  . ESOPHAGOGASTRODUODENOSCOPY  08/2008  . LAMINOTOMY  2001, 2006     Right L4-5 interlaminar laminotomy with excision of herniated   disc with operating microscope.  Marland Kitchen NECK SURGERY     cervical disc  . ORIF WRIST FRACTURE Right   . PERIPHERAL VASCULAR CATHETERIZATION N/A 02/27/2015   Procedure: Abdominal Aortogram;  Surgeon: Elam Dutch, MD;  Location: [place] [facility];  Service: Cardiovascular;  Laterality: N/A;  . PERIPHERAL VASCULAR CATHETERIZATION Bilateral 02/27/2015    Procedure: Lower Extremity Angiography;  Surgeon: Elam Dutch, MD;  Location: [place] [facility];  Service: Cardiovascular;  Laterality: Bilateral;  . Right ankle fracture open reduction internal fixation.  2003  . WOUND DEBRIDEMENT Right 12/24/2014   Procedure: DEBRIDEMENT SOFT TISSUE AND BONE RIGHT FOOT;  Surgeon: Caprice Beaver, DPM;  Location: [facility];  Service: Podiatry;  Laterality: Right;        Home Medications    Prior to Admission medications   Medication Sig Start Date End Date Taking? Authorizing Provider  aspirin EC 81 MG tablet Take 81 mg by mouth daily.    [provider]  B-D ULTRAFINE III SHORT PEN 31G X 8 MM MISC USE PEN NEEDLES DAILY WITH LANTUS 11/04/15   Kathyrn Drown, MD  dapagliflozin propanediol (FARXIGA) 10 MG TABS tablet Take 10 mg by mouth daily. 02/26/18   Kathyrn Drown, MD  Insulin Detemir (LEVEMIR FLEXTOUCH) 100 UNIT/ML Pen 56 units qhs- MAY TITRATE UP TO 70 UNITS INTO SKIN AT BEDTIME FOR DIABETES 02/26/18   Kathyrn Drown, MD  ondansetron (ZOFRAN) 8 MG tablet Take 1 tablet (8 mg total) by mouth every 8 (eight) hours as needed for nausea. 09/29/16   Kathyrn Drown, MD  oxyCODONE-acetaminophen (PERCOCET) 10-325 MG tablet Take 1 tablet by mouth every 4 (four) hours as needed for pain. As needed for pain 05/29/18   Kathyrn Drown, MD  rosuvastatin (CRESTOR) 5 MG tablet Take 1 tablet (5 mg total) by mouth at bedtime. 02/26/18   Kathyrn Drown, MD  senna-docusate (SENOKOT-S) 8.6-50 MG tablet Take 1 tablet by mouth at bedtime as needed for mild constipation. 09/16/16   Oswald Hillock, MD    Family History Family History  Problem Relation Age of Onset  . Cancer Mother   . Cancer Father   . Diabetes Father   . Cancer Sister   . Diabetes Sister   . Heart disease Sister        before age 46    Social History Social History   Tobacco Use  . Smoking status: Former Smoker    Packs/day: 1.00    Years: 10.00    Pack years: 10.00    Types:  Cigarettes    Last attempt to quit: 12/23/1970    Years since quitting: 47.7  . Smokeless tobacco: Never Used  . Tobacco comment: quit in 1979  Substance Use Topics  . Alcohol use: No    Alcohol/week: 0.0 standard drinks  . Drug use: Yes    Types: Marijuana    Comment: in the past     Allergies   Metformin and related; Statins; and Vicodin [hydrocodone-acetaminophen]   Review of Systems Review of Systems  Constitutional: Negative for fever.  HENT: Negative for congestion and sore throat.   Eyes: Negative.   Respiratory: Negative for chest tightness and shortness of breath.   Cardiovascular: Negative for chest  pain.  Gastrointestinal: Negative for abdominal pain, nausea and vomiting.  Genitourinary: Negative.   Musculoskeletal: Negative for arthralgias, joint swelling and neck pain.  Skin: Negative.  Negative for rash and wound.  Neurological: Positive for facial asymmetry, speech difficulty, weakness and headaches. Negative for dizziness, light-headedness and numbness.  Psychiatric/Behavioral: Negative.      Physical Exam Updated Vital Signs BP (!) 163/85   Pulse 89   Temp 98.1 F (36.7 C) (Oral)   Resp 16   Ht 6\' 3"  (1.905 m)   Wt 108.9 kg   SpO2 99%   BMI 30.00 kg/m   Physical Exam  Constitutional: He is oriented to person, place, and time. He appears well-developed and well-nourished.  HENT:  Head: Normocephalic and atraumatic.  Eyes: Pupils are equal, round, and reactive to light. Conjunctivae and EOM are normal. Right eye exhibits normal extraocular motion and no nystagmus. Left eye exhibits normal extraocular motion and no nystagmus.  Neck: Normal range of motion.  Cardiovascular: Normal rate, regular rhythm, normal heart sounds and intact distal pulses.  Pulmonary/Chest: Effort normal and breath sounds normal. He has no wheezes.  Abdominal: Soft. Bowel sounds are normal. There is no tenderness.  Musculoskeletal: Normal range of motion.  Neurological: He  is alert and oriented to person, place, and time. A cranial nerve deficit is present. No sensory deficit.  Gait not assessed at this time. Right upper extremity pronator drift  With difficulty controlling the extremity.  Grip strength 4/5 right, 5/5 left.  Right facial droop.  No foot drop. No numbness in upper or lower extremities.  5/5 ankle strength. No neglect. Tongue midline.    Skin: Skin is warm and dry.  Psychiatric: He has a normal mood and affect.  Nursing note and vitals reviewed.    ED Treatments / Results  Labs (all labs ordered are listed, but only abnormal results are displayed) Labs Reviewed  COMPREHENSIVE METABOLIC PANEL - Abnormal; Notable for the following components:      Result Value   Glucose, Bld 189 (*)    All other components within normal limits  CBG MONITORING, ED - Abnormal; Notable for the following components:   Glucose-Capillary 184 (*)    All other components within normal limits  ETHANOL  PROTIME-INR  APTT  CBC  DIFFERENTIAL  RAPID URINE DRUG SCREEN, HOSP PERFORMED  URINALYSIS, ROUTINE W REFLEX MICROSCOPIC  I-STAT TROPONIN, ED    EKG EKG Interpretation  Date/Time:  Tuesday September 18 2018 08:56:36 EST Ventricular Rate:  97 PR Interval:    QRS Duration: 164 QT Interval:  369 QTC Calculation: 469 R Axis:   -71 Text Interpretation:  Sinus rhythm Consider left atrial enlargement Left axis deviation Left bundle branch block When compared with ECG of 12/23/2014 No significant change was found Confirmed by Francine Graven [phone]) on 09/18/2018 9:07:38 AM   Radiology Mr Jodene Nam Head Wo Contrast  Result Date: 09/18/2018 CLINICAL DATA:  62 year old male with slurred speech and confusion x1 day. EXAM: MRI HEAD WITHOUT CONTRAST MRA HEAD WITHOUT CONTRAST TECHNIQUE: Multiplanar, multiecho pulse sequences of the brain and surrounding structures were obtained without intravenous contrast. Angiographic images of the head were obtained using MRA technique  without contrast. COMPARISON:  Cervical spine MRI 02/08/2011. FINDINGS: MRI HEAD FINDINGS Brain: 18 millimeter area of confluent restricted diffusion in the left corona radiata (series 4, image 38) with T2 and FLAIR hyperintensity. No associated acute hemorrhage or mass effect. No other convincing restricted diffusion. There is confluent bilateral cerebral white matter T2  and FLAIR hyperintensity elsewhere. There are numerous chronic micro hemorrhages scattered in the brain, most appear to be cortical or at the gray-white junction. There is a slightly larger macroscopic scratched at there is a slightly larger more macroscopic area of hemosiderin in the posterior left parietal lobe on series 15, image 16. No definite cortical encephalomalacia. The deep gray matter nuclei, brainstem, and cerebellum appear relatively spared and within normal limits for age. No midline shift, mass effect, evidence of mass lesion, ventriculomegaly, extra-axial collection or acute intracranial hemorrhage. Cervicomedullary junction and pituitary are within normal limits. Vascular: Major intracranial vascular flow voids are preserved. The left vertebral artery appears dominant. Skull and upper cervical spine: Negative visible cervical spine. Visualized bone marrow signal is within normal limits. Sinuses/Orbits: Negative orbits. Trace paranasal sinus mucosal thickening. Other: Mastoids are clear. Visible internal auditory structures appear normal. There is a 14 millimeter T2 hypointense soft tissue nodule in the posterior right parotid gland with mild T1 hyperintensity (series 2, image 2 and series 16, image 17. Other scalp and face soft tissues appear negative. MRA HEAD FINDINGS Antegrade flow in the posterior circulation with dominant appearing distal left vertebral artery. No distal vertebral stenosis. Patent left PICA origin. The right AICA may be dominant. Patent vertebrobasilar junction and basilar artery without stenosis. Normal SCA  and PCA origins. Both posterior communicating arteries are present. Bilateral PCA branches are within normal limits. Antegrade flow in both ICA siphons. No siphon stenosis. Normal ophthalmic and posterior communicating artery origins. Patent carotid termini. The left A1 segment appears mildly dominant. Anterior communicating artery and visible ACA branches are within normal limits. Right MCA M1 segment, bifurcation, and visible right MCA branches are within normal limits. The left MCA M1 is mildly irregular but without stenosis. The left MCA bifurcation is patent. The left MCA branches are mildly irregular but patent with no branch occlusion identified. IMPRESSION: 1. Acute 18 mm infarct in the left corona radiata. No associated hemorrhage or mass effect. 2. Mild irregularity of the left MCA branches, with no branch occlusion identified, and otherwise negative intracranial MRA. 3. Advanced chronic micro-hemorrhages and cerebral white matter signal changes in the brain while the deep gray matter nuclei, brainstem and cerebellum are relatively spared. Consider the possibility of Amyloid Angiopathy. 4. 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 Recommend outpatient ENT follow-up. Electronically Signed   By: Genevie Ann M.D.   On: 09/18/2018 10:16   Mr Brain Wo Contrast  Result Date: 09/18/2018 CLINICAL DATA:  62 year old male with slurred speech and confusion x1 day. EXAM: MRI HEAD WITHOUT CONTRAST MRA HEAD WITHOUT CONTRAST TECHNIQUE: Multiplanar, multiecho pulse sequences of the brain and surrounding structures were obtained without intravenous contrast. Angiographic images of the head were obtained using MRA technique without contrast. COMPARISON:  Cervical spine MRI 02/08/2011. FINDINGS: MRI HEAD FINDINGS Brain: 18 millimeter area of confluent restricted diffusion in the left corona radiata (series 4, image 38) with T2 and FLAIR hyperintensity. No associated acute  hemorrhage or mass effect. No other convincing restricted diffusion. There is confluent bilateral cerebral white matter T2 and FLAIR hyperintensity elsewhere. There are numerous chronic micro hemorrhages scattered in the brain, most appear to be cortical or at the gray-white junction. There is a slightly larger macroscopic scratched at there is a slightly larger more macroscopic area of hemosiderin in the posterior left parietal lobe on series 15, image 16. No definite cortical encephalomalacia. The deep gray matter nuclei, brainstem, and cerebellum appear relatively spared  and within normal limits for age. No midline shift, mass effect, evidence of mass lesion, ventriculomegaly, extra-axial collection or acute intracranial hemorrhage. Cervicomedullary junction and pituitary are within normal limits. Vascular: Major intracranial vascular flow voids are preserved. The left vertebral artery appears dominant. Skull and upper cervical spine: Negative visible cervical spine. Visualized bone marrow signal is within normal limits. Sinuses/Orbits: Negative orbits. Trace paranasal sinus mucosal thickening. Other: Mastoids are clear. Visible internal auditory structures appear normal. There is a 14 millimeter T2 hypointense soft tissue nodule in the posterior right parotid gland with mild T1 hyperintensity (series 2, image 2 and series 16, image 17. Other scalp and face soft tissues appear negative. MRA HEAD FINDINGS Antegrade flow in the posterior circulation with dominant appearing distal left vertebral artery. No distal vertebral stenosis. Patent left PICA origin. The right AICA may be dominant. Patent vertebrobasilar junction and basilar artery without stenosis. Normal SCA and PCA origins. Both posterior communicating arteries are present. Bilateral PCA branches are within normal limits. Antegrade flow in both ICA siphons. No siphon stenosis. Normal ophthalmic and posterior communicating artery origins. Patent carotid  termini. The left A1 segment appears mildly dominant. Anterior communicating artery and visible ACA branches are within normal limits. Right MCA M1 segment, bifurcation, and visible right MCA branches are within normal limits. The left MCA M1 is mildly irregular but without stenosis. The left MCA bifurcation is patent. The left MCA branches are mildly irregular but patent with no branch occlusion identified. IMPRESSION: 1. Acute 18 mm infarct in the left corona radiata. No associated hemorrhage or mass effect. 2. Mild irregularity of the left MCA branches, with no branch occlusion identified, and otherwise negative intracranial MRA. 3. Advanced chronic micro-hemorrhages and cerebral white matter signal changes in the brain while the deep gray matter nuclei, brainstem and cerebellum are relatively spared. Consider the possibility of Amyloid Angiopathy. 4. 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 Recommend outpatient ENT follow-up. Electronically Signed   By: Genevie Ann M.D.   On: 09/18/2018 10:16    Procedures Procedures (including critical care time)  Medications Ordered in ED Medications - No data to display   Initial Impression / Assessment and Plan / ED Course  I have reviewed the triage vital signs and the nursing notes.  Pertinent labs & imaging results that were available during my care of the patient were reviewed by me and considered in my medical decision making (see chart for details).     Pt with acute cva with right sided weakness, occurring ytd am.  No symptoms suggesting large vessel occlusion.  Also incidental left parotid nodule, ? Salivary neoplasm. MRI, labs reviewed and discussed with pt and wife.  Pt will need admission for further eval and tx.    Pt routinely takes percocet q 4 hours for chronic pain.  C/o headache at this time.  Given small dose of morphine IV, given npo status.  Should tolerate well without excessive  sedation.  Final Clinical Impressions(s) / ED Diagnoses   Final diagnoses:  Cerebrovascular accident (CVA) due to occlusion of cerebral artery [facility])  Mass of left parotid gland    ED Discharge Orders    None       Landis Martins 09/18/18 [place], Claudell Rhody, PA-C 09/18/18 [place], [place], DO 09/22/18 [phone]

## 2018-09-18 NOTE — ED Notes (Signed)
Patient transported to X-ray 

## 2018-09-18 NOTE — H&P (Signed)
History and Physical    [name] AOZ:[id number] DOB: [birth year] DOA: 09/18/2018  Referring MD/NP/PA: Dr. Thurnell Garbe PCP: Kathyrn Drown, MD  Patient coming from: Home  Chief Complaint: Right facial droop, right-sided weakness and dysarthria.  HPI: [name] is a 62 y.o. male with past medical history significant for systolic heart failure (last ejection fraction 40%), hypertension, hyperlipidemia, type 2 diabetes mellitus and class I obesity; who presented to ED from PCP office secondary to right-sided weakness, facial droop and dysarthria.  According to the patient he has these symptoms of weakness and just not feeling himself on 09/17/18 in the morning and instead of seeking for medical attention he decided to go to bed and hope that all the symptoms will improve on their own.  Today he has an appointment with his PCP and after being evaluated he was sent to the emergency department acutely at due to the concerns of ongoing stroke.  In the ED patient was in no acute distress; denies chest pain, no nausea, no vomiting, no fever, no chills, no hematuria, no dysuria, no melena, no hematochezia, no shortness of breath or any other complaints.  He was found with acute right facial droop, dysarthria and right-sided weakness (affecting more his right upper extremity than his right lower extremity).  CBG was 184, the rest of his blood work was unremarkable.  UDS positive for marijuana and opiates.  No signs of acute infection in his urinalysis.  MRI of the brain demonstrated acute 18 mm infarct in the left corona radiata with no associated hemorrhage or mass-effect.  She has been admitted for stroke work-up.  Past Medical/Surgical History: Past Medical History:  Diagnosis Date  . CAD (coronary artery disease)    status post PCI and bare metal stenting of   the proximal right coronary artery with placement of a Driver bare   metal stent.   . CHF (congestive heart failure) ([place])   . Depression     . Diabetes mellitus   . DJD (degenerative joint disease)   . First degree AV block    with intermittent second degree types I and    II heart block.   . Gout   . Heart attack ([place]) 2008  . Heart murmur 1974  . HTN (hypertension)   . Hypercholesterolemia   . Hyperlipidemia   . Myocardial infarction ([place])   . Obesity   . Oral candidiasis   . Peripheral vascular disease [facility])     Past Surgical History:  Procedure Laterality Date  . AMPUTATION Right 01/12/2015   Procedure: PARTIAL AMPUTATION 1ST RAY RIGHT FOOT (Amputation of right great toe and portion of the first metatarsal bone right foot);  Surgeon: Caprice Beaver, DPM;  Location: [facility];  Service: Podiatry;  Laterality: Right;  . AMPUTATION Right 09/14/2016   Procedure: PARTIAL AMPUTATION RAY 2ND TOE RIGHT FOOT;  Surgeon: Caprice Beaver, DPM;  Location: [facility];  Service: Podiatry;  Laterality: Right;  . COLONOSCOPY  08/2008  . ESOPHAGOGASTRODUODENOSCOPY  08/2008  . LAMINOTOMY  2001, 2006     Right L4-5 interlaminar laminotomy with excision of herniated   disc with operating microscope.  Marland Kitchen NECK SURGERY     cervical disc  . ORIF WRIST FRACTURE Right   . PERIPHERAL VASCULAR CATHETERIZATION N/A 02/27/2015   Procedure: Abdominal Aortogram;  Surgeon: Elam Dutch, MD;  Location: [place] [facility];  Service: Cardiovascular;  Laterality: N/A;  . PERIPHERAL VASCULAR CATHETERIZATION Bilateral 02/27/2015   Procedure: Lower Extremity Angiography;  Surgeon: Elam Dutch, MD;  Location: [place] CV [facility];  Service: Cardiovascular;  Laterality: Bilateral;  . Right ankle fracture open reduction internal fixation.  2003  . WOUND DEBRIDEMENT Right 12/24/2014   Procedure: DEBRIDEMENT SOFT TISSUE AND BONE RIGHT FOOT;  Surgeon: Caprice Beaver, DPM;  Location: [facility];  Service: Podiatry;  Laterality: Right;    Social History:  reports that he quit smoking about 47 years ago. His smoking use included cigarettes. He has a 10.00  pack-year smoking history. He has never used smokeless tobacco. He reports that he has current or past drug history. Drug: Marijuana. He reports that he does not drink alcohol.  Allergies: Allergies  Allergen Reactions  . Metformin And Related     GI symptoms  . Statins Other (See Comments)    Legs ache, he states Crestor cause shoulder pain  . Vicodin [Hydrocodone-Acetaminophen] Nausea Only    Family History:  Family History  Problem Relation Age of Onset  . Cancer Mother   . Cancer Father   . Diabetes Father   . Cancer Sister   . Diabetes Sister   . Heart disease Sister        before age 77    Prior to Admission medications   Medication Sig Start Date End Date Taking? Authorizing Provider  aspirin EC 81 MG tablet Take 81 mg by mouth daily.   Yes [provider]  B-D ULTRAFINE III SHORT PEN 31G X 8 MM MISC USE PEN NEEDLES DAILY WITH LANTUS 11/04/15  Yes Luking, Scott A, MD  dapagliflozin propanediol (FARXIGA) 10 MG TABS tablet Take 10 mg by mouth daily. 02/26/18  Yes Kathyrn Drown, MD  Insulin Detemir (LEVEMIR FLEXTOUCH) 100 UNIT/ML Pen 56 units qhs- MAY TITRATE UP TO 70 UNITS INTO SKIN AT BEDTIME FOR DIABETES 02/26/18  Yes Kathyrn Drown, MD  oxyCODONE-acetaminophen (PERCOCET) 10-325 MG tablet Take 1 tablet by mouth every 4 (four) hours as needed for pain. As needed for pain 05/29/18  Yes Luking, Scott A, MD  rosuvastatin (CRESTOR) 5 MG tablet Take 1 tablet (5 mg total) by mouth at bedtime. 02/26/18  Yes Luking, Elayne Snare, MD  senna-docusate (SENOKOT-S) 8.6-50 MG tablet Take 1 tablet by mouth at bedtime as needed for mild constipation. 09/16/16  Yes Oswald Hillock, MD    Review of Systems:  Negative except as otherwise mentioned in HPI.   Physical Exam: Vitals:   09/18/18 1130 09/18/18 1250 09/18/18 1310 09/18/18 1510  BP: (!) 147/88 (!) 152/87 (!) 147/84 136/72  Pulse: 81 79 77 78  Resp: 10 12  20   Temp:  98 F (36.7 C) 99.2 F (37.3 C) 98.9 F (37.2 C)    TempSrc:  Oral Oral Oral  SpO2: 100% 99% 100% 98%  Weight:   114.1 kg   Height:   6\' 3"  (1.905 m)    Constitutional: NAD, calm, comfortable; afebrile.  Patient with right facial droop and appreciated dysarthria.  No chest pain, no nausea, no vomiting. Eyes: PERRL, lids and conjunctivae normal, no icterus. ENMT: Mucous membranes are moist and patient was drooling out of the right corner of his mouth. Posterior pharynx clear of any exudate or lesions. No thrush Neck: normal, supple, no masses, no thyromegaly, no JVD Respiratory: clear to auscultation bilaterally, no wheezing, no crackles. Normal respiratory effort. No accessory muscle use.  Cardiovascular: Regular rate and rhythm, no murmurs / rubs / gallops. No extremity edema. 2+ pedal pulses. No carotid bruits.  Abdomen: no tenderness,  no masses palpated. No hepatosplenomegaly. Bowel sounds positive.  Musculoskeletal: no clubbing / cyanosis. No contractures. Normal muscle tone.  Patient reports some decrease in his range of motion secondary to chronic osteoarthritis and pain. Skin: no rashes, lesions, ulcers. No induration Neurologic: Alert, awake and oriented x3; positive right facial droop and dysarthria.  Patient strength right upper extremity 3/5; right lower extremity 4/5; the rest of his limbs with preserved muscle strength 5/5.  No pronator drift.   Psychiatric: Normal judgment and insight. Alert and oriented x 3.  Mood is stable.   Labs on Admission: I have personally reviewed the following labs and imaging studies  CBC: Recent Labs  Lab 09/18/18 0903  WBC 8.9  NEUTROABS 6.6  HGB 13.7  HCT 42.3  MCV 87.4  PLT 196   Basic Metabolic Panel: Recent Labs  Lab 09/18/18 0903  NA 137  K 3.8  CL 103  CO2 23  GLUCOSE 189*  BUN 17  CREATININE 0.96  CALCIUM 9.2   GFR: Estimated Creatinine Clearance: 107.3 mL/min (by C-G formula based on SCr of 0.96 mg/dL).   Liver Function Tests: Recent Labs  Lab 09/18/18 0903  AST  23  ALT 21  ALKPHOS 59  BILITOT 0.9  PROT 8.0  ALBUMIN 4.6   Coagulation Profile: Recent Labs  Lab 09/18/18 0903  INR 1.01   CBG: Recent Labs  Lab 09/18/18 0855 09/18/18 1616  GLUCAP 184* 119*   Urine analysis:    Component Value Date/Time   COLORURINE YELLOW 09/18/2018 0903   APPEARANCEUR CLEAR 09/18/2018 0903   LABSPEC 1.022 09/18/2018 0903   PHURINE 5.0 09/18/2018 0903   GLUCOSEU >=500 (A) 09/18/2018 0903   HGBUR NEGATIVE 09/18/2018 0903   BILIRUBINUR NEGATIVE 09/18/2018 0903   KETONESUR 20 (A) 09/18/2018 0903   PROTEINUR NEGATIVE 09/18/2018 0903   UROBILINOGEN 1.0 03/25/2008 1430   NITRITE NEGATIVE 09/18/2018 0903   LEUKOCYTESUR NEGATIVE 09/18/2018 0903   Radiological Exams on Admission: Dg Chest 2 View  Result Date: 09/18/2018 CLINICAL DATA:  Stroke. EXAM: CHEST - 2 VIEW COMPARISON:  01/03/2015. FINDINGS: Mediastinum and hilar structures normal. Lungs are clear. No pleural effusion or pneumothorax. Heart size normal. No acute bony abnormality. Cervical spine fusion. IMPRESSION: Cardiomegaly. No pulmonary venous congestion. No acute pulmonary disease. Electronically Signed   By: Marcello Moores  Register   On: 09/18/2018 11:58   Mr Jodene Nam Head Wo Contrast  Result Date: 09/18/2018 CLINICAL DATA:  62 year old male with slurred speech and confusion x1 day. EXAM: MRI HEAD WITHOUT CONTRAST MRA HEAD WITHOUT CONTRAST TECHNIQUE: Multiplanar, multiecho pulse sequences of the brain and surrounding structures were obtained without intravenous contrast. Angiographic images of the head were obtained using MRA technique without contrast. COMPARISON:  Cervical spine MRI 02/08/2011. FINDINGS: MRI HEAD FINDINGS Brain: 18 millimeter area of confluent restricted diffusion in the left corona radiata (series 4, image 38) with T2 and FLAIR hyperintensity. No associated acute hemorrhage or mass effect. No other convincing restricted diffusion. There is confluent bilateral cerebral white matter T2 and  FLAIR hyperintensity elsewhere. There are numerous chronic micro hemorrhages scattered in the brain, most appear to be cortical or at the gray-white junction. There is a slightly larger macroscopic scratched at there is a slightly larger more macroscopic area of hemosiderin in the posterior left parietal lobe on series 15, image 16. No definite cortical encephalomalacia. The deep gray matter nuclei, brainstem, and cerebellum appear relatively spared and within normal limits for age. No midline shift, mass effect, evidence of mass lesion, ventriculomegaly,  extra-axial collection or acute intracranial hemorrhage. Cervicomedullary junction and pituitary are within normal limits. Vascular: Major intracranial vascular flow voids are preserved. The left vertebral artery appears dominant. Skull and upper cervical spine: Negative visible cervical spine. Visualized bone marrow signal is within normal limits. Sinuses/Orbits: Negative orbits. Trace paranasal sinus mucosal thickening. Other: Mastoids are clear. Visible internal auditory structures appear normal. There is a 14 millimeter T2 hypointense soft tissue nodule in the posterior right parotid gland with mild T1 hyperintensity (series 2, image 2 and series 16, image 17. Other scalp and face soft tissues appear negative. MRA HEAD FINDINGS Antegrade flow in the posterior circulation with dominant appearing distal left vertebral artery. No distal vertebral stenosis. Patent left PICA origin. The right AICA may be dominant. Patent vertebrobasilar junction and basilar artery without stenosis. Normal SCA and PCA origins. Both posterior communicating arteries are present. Bilateral PCA branches are within normal limits. Antegrade flow in both ICA siphons. No siphon stenosis. Normal ophthalmic and posterior communicating artery origins. Patent carotid termini. The left A1 segment appears mildly dominant. Anterior communicating artery and visible ACA branches are within normal  limits. Right MCA M1 segment, bifurcation, and visible right MCA branches are within normal limits. The left MCA M1 is mildly irregular but without stenosis. The left MCA bifurcation is patent. The left MCA branches are mildly irregular but patent with no branch occlusion identified. IMPRESSION: 1. Acute 18 mm infarct in the left corona radiata. No associated hemorrhage or mass effect. 2. Mild irregularity of the left MCA branches, with no branch occlusion identified, and otherwise negative intracranial MRA. 3. Advanced chronic micro-hemorrhages and cerebral white matter signal changes in the brain while the deep gray matter nuclei, brainstem and cerebellum are relatively spared. Consider the possibility of Amyloid Angiopathy. 4. 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 Recommend outpatient ENT follow-up. Electronically Signed   By: Genevie Ann M.D.   On: 09/18/2018 10:16   Mr Brain Wo Contrast  Result Date: 09/18/2018 CLINICAL DATA:  62 year old male with slurred speech and confusion x1 day. EXAM: MRI HEAD WITHOUT CONTRAST MRA HEAD WITHOUT CONTRAST TECHNIQUE: Multiplanar, multiecho pulse sequences of the brain and surrounding structures were obtained without intravenous contrast. Angiographic images of the head were obtained using MRA technique without contrast. COMPARISON:  Cervical spine MRI 02/08/2011. FINDINGS: MRI HEAD FINDINGS Brain: 18 millimeter area of confluent restricted diffusion in the left corona radiata (series 4, image 38) with T2 and FLAIR hyperintensity. No associated acute hemorrhage or mass effect. No other convincing restricted diffusion. There is confluent bilateral cerebral white matter T2 and FLAIR hyperintensity elsewhere. There are numerous chronic micro hemorrhages scattered in the brain, most appear to be cortical or at the gray-white junction. There is a slightly larger macroscopic scratched at there is a slightly larger more macroscopic  area of hemosiderin in the posterior left parietal lobe on series 15, image 16. No definite cortical encephalomalacia. The deep gray matter nuclei, brainstem, and cerebellum appear relatively spared and within normal limits for age. No midline shift, mass effect, evidence of mass lesion, ventriculomegaly, extra-axial collection or acute intracranial hemorrhage. Cervicomedullary junction and pituitary are within normal limits. Vascular: Major intracranial vascular flow voids are preserved. The left vertebral artery appears dominant. Skull and upper cervical spine: Negative visible cervical spine. Visualized bone marrow signal is within normal limits. Sinuses/Orbits: Negative orbits. Trace paranasal sinus mucosal thickening. Other: Mastoids are clear. Visible internal auditory structures appear normal. There is a 14 millimeter  T2 hypointense soft tissue nodule in the posterior right parotid gland with mild T1 hyperintensity (series 2, image 2 and series 16, image 17. Other scalp and face soft tissues appear negative. MRA HEAD FINDINGS Antegrade flow in the posterior circulation with dominant appearing distal left vertebral artery. No distal vertebral stenosis. Patent left PICA origin. The right AICA may be dominant. Patent vertebrobasilar junction and basilar artery without stenosis. Normal SCA and PCA origins. Both posterior communicating arteries are present. Bilateral PCA branches are within normal limits. Antegrade flow in both ICA siphons. No siphon stenosis. Normal ophthalmic and posterior communicating artery origins. Patent carotid termini. The left A1 segment appears mildly dominant. Anterior communicating artery and visible ACA branches are within normal limits. Right MCA M1 segment, bifurcation, and visible right MCA branches are within normal limits. The left MCA M1 is mildly irregular but without stenosis. The left MCA bifurcation is patent. The left MCA branches are mildly irregular but patent with no  branch occlusion identified. IMPRESSION: 1. Acute 18 mm infarct in the left corona radiata. No associated hemorrhage or mass effect. 2. Mild irregularity of the left MCA branches, with no branch occlusion identified, and otherwise negative intracranial MRA. 3. Advanced chronic micro-hemorrhages and cerebral white matter signal changes in the brain while the deep gray matter nuclei, brainstem and cerebellum are relatively spared. Consider the possibility of Amyloid Angiopathy. 4. 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 Recommend outpatient ENT follow-up. Electronically Signed   By: Genevie Ann M.D.   On: 09/18/2018 10:16   US Carotid Bilateral (at [facility] Only)  Result Date: 09/18/2018 CLINICAL DATA:  Acute left corona radiata stroke EXAM: BILATERAL CAROTID DUPLEX ULTRASOUND TECHNIQUE: Pearline Cables scale imaging, color Doppler and duplex ultrasound were performed of bilateral carotid and vertebral arteries in the neck. COMPARISON:  09/18/2018 FINDINGS: Criteria: Quantification of carotid stenosis is based on velocity parameters that correlate the residual internal carotid diameter with NASCET-based stenosis levels, using the diameter of the distal internal carotid lumen as the denominator for stenosis measurement. The following velocity measurements were obtained: RIGHT ICA: 70/17 cm/sec CCA: 73/53 cm/sec SYSTOLIC ICA/CCA RATIO:  1.0 ECA: 109 cm/sec LEFT ICA: 63/22 cm/sec CCA: 299/24 cm/sec SYSTOLIC ICA/CCA RATIO:  0.6 ECA: 101 cm/sec RIGHT CAROTID ARTERY: Minor echogenic shadowing plaque formation. No hemodynamically significant right ICA stenosis, velocity elevation, or turbulent flow. Degree of narrowing less than 50%. RIGHT VERTEBRAL ARTERY:  Antegrade LEFT CAROTID ARTERY: Similar scattered minor echogenic plaque formation. No hemodynamically significant left ICA stenosis, velocity elevation, or turbulent flow. LEFT VERTEBRAL ARTERY:  Antegrade IMPRESSION: Minor carotid  atherosclerosis. No hemodynamically significant ICA stenosis. Degree of narrowing less than 50% bilaterally by ultrasound criteria. Patent antegrade vertebral flow bilaterally Electronically Signed   By: Jerilynn Mages.  Shick M.D.   On: 09/18/2018 12:32    EKG: Independently reviewed.  Sinus rhythm, rate control, normal axis.  No acute ischemic changes.  Assessment/Plan 1-acute stroke -Affecting left corona radiata area -Patient with positive right-sided weakness, right facial droop and dysarthria -Symptoms were present since 09/17/2018 and the patient presented to the hospital out of therapeutic window on 09/18/2018 -MRI done in the ED demonstrated 18 mm acute infarct in the left corona radiata area -Completed stroke work-up doing echo and carotid Dopplers -Patient has passed swallowing evaluation and a dysphagia 3 diet has been ordered. -Check A1c, lipid panel; and follow any further recommendations by neurology -For now continue with full dose aspirin but he most likely will  need Plavix as prior to admission he was on a baby aspirin on daily basis.    2-essential hypertension -Patient was now actively taking any antihypertensive agents prior to admission  -For now will allow permissive hypertension in the setting of acute/subacute stroke -Follow vital signs -PRN hydralazine ordered.  3-type 2 diabetes mellitus no chronically on insulin -Patient started on sliding scale insulin and Lantus -Holding farxiga while inpatient -Modified carbohydrate diets has been ordered -Follow A1c  4-hyperlipidemia -Checking lipid panel -Continue statins  5-chronic pain syndrome: In the setting of osteoarthritis and prior fractures in the past -Continue as needed home pain medication using oxycodone and Tylenol.  6-history of constipation -Most likely associated with chronic use of narcotics -Will use as needed laxatives.  7-history of marijuana abuse -Cessation counseling has been provided.  8-chronic  systolic CHF -Compensated -Follow daily weights and strict intake and output -Heart healthy diet has been ordered -Echo will be repeated as part of a stroke work-up and can follow ejection fraction. -Patient was not taking any heart medication chronically.  9-left salivary gland 14 mm nodule -Currently asymptomatic -Appearance and MRI suggested salivary gland neoplasm -Recommendations are given for outpatient follow-up with ENT.  DVT prophylaxis: Heparin Code Status: Full code Family Communication: No family at bedside Disposition Plan: To be determined; completed stroke work-up and follow results from evaluation by physical therapy/Occupational Therapy for discharge recommendations. Consults called: Neurology Admission status: Inpatient, telemetry, LOS more than 2 midnights.   Time Spent: 65 minutes  Barton Dubois MD [facility] Pager [phone]  If 7PM-7AM, please contact night-coverage [email or web address] Password [email or web address]  09/18/2018, 5:21 PM

## 2018-09-18 NOTE — Evaluation (Signed)
Clinical/Bedside Swallow Evaluation Patient Details  Name: [name] MRN: [id number] Date of Birth: 10-08-1956  Today's Date: 09/18/2018 Time: SLP Start Time (ACUTE ONLY): 9924 SLP Stop Time (ACUTE ONLY): 1332 SLP Time Calculation (min) (ACUTE ONLY): 18 min  Past Medical History:  Past Medical History:  Diagnosis Date  . CAD (coronary artery disease)    status post PCI and bare metal stenting of   the proximal right coronary artery with placement of a Driver bare   metal stent.   . CHF (congestive heart failure) ([place])   . Depression   . Diabetes mellitus   . DJD (degenerative joint disease)   . First degree AV block    with intermittent second degree types I and    II heart block.   . Gout   . Heart attack ([place]) 2008  . Heart murmur 1974  . HTN (hypertension)   . Hypercholesterolemia   . Hyperlipidemia   . Myocardial infarction ([place])   . Obesity   . Oral candidiasis   . Peripheral vascular disease [facility])    Past Surgical History:  Past Surgical History:  Procedure Laterality Date  . AMPUTATION Right 01/12/2015   Procedure: PARTIAL AMPUTATION 1ST RAY RIGHT FOOT (Amputation of right great toe and portion of the first metatarsal bone right foot);  Surgeon: Caprice Beaver, DPM;  Location: [facility];  Service: Podiatry;  Laterality: Right;  . AMPUTATION Right 09/14/2016   Procedure: PARTIAL AMPUTATION RAY 2ND TOE RIGHT FOOT;  Surgeon: Caprice Beaver, DPM;  Location: [facility];  Service: Podiatry;  Laterality: Right;  . COLONOSCOPY  08/2008  . ESOPHAGOGASTRODUODENOSCOPY  08/2008  . LAMINOTOMY  2001, 2006     Right L4-5 interlaminar laminotomy with excision of herniated   disc with operating microscope.  [name] NECK SURGERY     cervical disc  . ORIF WRIST FRACTURE Right   . PERIPHERAL VASCULAR CATHETERIZATION N/A 02/27/2015   Procedure: Abdominal Aortogram;  Surgeon: Elam Dutch, MD;  Location: [facility];  Service: Cardiovascular;  Laterality: N/A;  . PERIPHERAL  VASCULAR CATHETERIZATION Bilateral 02/27/2015   Procedure: Lower Extremity Angiography;  Surgeon: Elam Dutch, MD;  Location: [place] [facility];  Service: Cardiovascular;  Laterality: Bilateral;  . Right ankle fracture open reduction internal fixation.  2003  . WOUND DEBRIDEMENT Right 12/24/2014   Procedure: DEBRIDEMENT SOFT TISSUE AND BONE RIGHT FOOT;  Surgeon: Caprice Beaver, DPM;  Location: [facility];  Service: Podiatry;  Laterality: Right;   HPI:  [name] is a 62 y.o. male with past medical history significant for systolic heart failure (last ejection fraction 40%), hypertension, hyperlipidemia, type 2 diabetes mellitus and class I obesity; who presented to ED from PCP office secondary to right-sided weakness, facial droop and dysarthria.  According to the patient he has these symptoms of weakness and just not feeling himself on 09/17/18 in the morning and instead of seeking for medical attention he decided to go to bed and hope that all the symptoms will improve on their own.  Today he had an appointment with his PCP and after being evaluated he was sent to the emergency department acutely due to the concerns of ongoing stroke.  In the ED patient was in no acute distress; denies chest pain, no nausea, no vomiting, no fever, no chills, no hematuria, no dysuria, no melena, no hematochezia, no shortness of breath or any other complaints. He was found with acute right facial droop, dysarthria and right-sided weakness (affecting more his  right upper extremity than his right lower extremity).  CBG was 184, the rest of his blood work was unremarkable.  UDS positive for marijuana and opiates.  No signs of acute infection in his urinalysis.  MRI of the brain demonstrated acute 18 mm infarct in the left corona radiata with no associated hemorrhage or mass-effect.   Assessment / Plan / Recommendation Clinical Impression  Pt with mild oral phase dysphagia with right labial weakness and reduced lip  closure and mild pocketing on the right, which Pt clears with verbal cue and liquid wash. Pt encouraged to take small sips (had one coughing episode with ice chip) and avoid straws for now, D3/mech soft and SLP to follow up tomorrow for SLE if needed. Pt/family in agreement with plan. SLP Visit Diagnosis: Dysphagia, oral phase (R13.11)    Aspiration Risk  Mild aspiration risk    Diet Recommendation Dysphagia 3 (Mech soft);Thin liquid   Liquid Administration via: Cup;No straw Medication Administration: Whole meds with liquid Supervision: Patient able to self feed;Intermittent supervision to cue for compensatory strategies Compensations: Slow rate;Small sips/bites;Lingual sweep for clearance of pocketing Postural Changes: Seated upright at 90 degrees;Remain upright for at least 30 minutes after po intake    Other  Recommendations Oral Care Recommendations: Oral care BID;Patient independent with oral care Other Recommendations: Clarify dietary restrictions   Follow up Recommendations Home health SLP      Frequency and Duration min 2x/week  1 week       Prognosis Prognosis for Safe Diet Advancement: Good      Swallow Study   General Date of Onset: 09/16/18 HPI: [name] is a 62 y.o. male with past medical history significant for systolic heart failure (last ejection fraction 40%), hypertension, hyperlipidemia, type 2 diabetes mellitus and class I obesity; who presented to ED from PCP office secondary to right-sided weakness, facial droop and dysarthria.  According to the patient he has these symptoms of weakness and just not feeling himself on 09/17/18 in the morning and instead of seeking for medical attention he decided to go to bed and hope that all the symptoms will improve on their own.  Today he had an appointment with his PCP and after being evaluated he was sent to the emergency department acutely due to the concerns of ongoing stroke.  In the ED patient was in no acute  distress; denies chest pain, no nausea, no vomiting, no fever, no chills, no hematuria, no dysuria, no melena, no hematochezia, no shortness of breath or any other complaints. He was found with acute right facial droop, dysarthria and right-sided weakness (affecting more his right upper extremity than his right lower extremity).  CBG was 184, the rest of his blood work was unremarkable.  UDS positive for marijuana and opiates.  No signs of acute infection in his urinalysis.  MRI of the brain demonstrated acute 18 mm infarct in the left corona radiata with no associated hemorrhage or mass-effect. Type of Study: Bedside Swallow Evaluation Diet Prior to this Study: NPO Temperature Spikes Noted: No Respiratory Status: Room air History of Recent Intubation: No Behavior/Cognition: Alert;Cooperative;Pleasant mood Oral Cavity Assessment: Within Functional Limits Oral Care Completed by SLP: Yes Oral Cavity - Dentition: Adequate natural dentition Vision: Functional for self-feeding Self-Feeding Abilities: Able to feed self Patient Positioning: Upright in bed Baseline Vocal Quality: Normal Volitional Cough: Strong Volitional Swallow: Able to elicit    Oral/Motor/Sensory Function Overall Oral Motor/Sensory Function: Mild impairment Facial ROM: Reduced right;Suspected CN VII (facial) dysfunction  Facial Symmetry: Abnormal symmetry right;Suspected CN VII (facial) dysfunction Facial Strength: Reduced right;Suspected CN VII (facial) dysfunction Facial Sensation: Within Functional Limits Lingual ROM: Within Functional Limits Lingual Symmetry: Within Functional Limits Lingual Strength: Within Functional Limits Lingual Sensation: Within Functional Limits Velum: Within Functional Limits Mandible: Within Functional Limits   Ice Chips Ice chips: Within functional limits Presentation: Cup;Self Fed(Pt initially coughed on an ice chip taken with cup of water)   Thin Liquid Thin Liquid: Within functional  limits Presentation: Cup;Self Fed;Straw    Nectar Thick Nectar Thick Liquid: Not tested   Honey Thick Honey Thick Liquid: Not tested   Puree Puree: Within functional limits Presentation: Spoon;Self Fed   Solid     Solid: Impaired Presentation: Self Fed Oral Phase Impairments: Reduced labial seal Oral Phase Functional Implications: Right lateral sulci pocketing     Thank you,  Genene Churn, [place]  Manasa Spease 09/18/2018,7:06 PM

## 2018-09-18 NOTE — ED Notes (Signed)
Patient transported to MRI 

## 2018-09-18 NOTE — Progress Notes (Signed)
Established Patient Office Visit  Subjective:  Patient ID: [name], male    DOB: [birth year]  Age: 62 y.o. MRN: [id number]  CC:  Chief Complaint  Patient presents with  . Extremity Weakness    unable to swallow    HPI CONO GEBHARD presents for right-sided weakness and difficulty speaking  The patient relates he was in his usual state of health until yesterday he started noticing that he is having right arm weakness right leg weakness along with some facial weakness and difficulty speaking He states that he had difficulty moving around he was concerned about the possibility of developing a stroke but he was not certain.  He states he was able to take care of himself in regards to eating getting dressed using the bathroom but last evening he fell 2 separate times in the bathroom trying to move about during the night when he needed to use the restroom. His wife states that his weakness seemed worse yesterday and his speech was worse yesterday not quite as bad this morning  He does have numerous risk factors for stroke including blood pressure, diabetes, hyperlipidemia, obesity, previous peripheral vascular disease  He does state compliance with his medications. Past Medical History:  Diagnosis Date  . CAD (coronary artery disease)    status post PCI and bare metal stenting of   the proximal right coronary artery with placement of a Driver bare   metal stent.   . CHF (congestive heart failure) ([place])   . Depression   . Diabetes mellitus   . DJD (degenerative joint disease)   . First degree AV block    with intermittent second degree types I and    II heart block.   . Gout   . Heart attack ([place]) 2008  . Heart murmur 1974  . HTN (hypertension)   . Hypercholesterolemia   . Hyperlipidemia   . Myocardial infarction ([place])   . Obesity   . Oral candidiasis   . Peripheral vascular disease [facility])     Past Surgical History:  Procedure Laterality Date  . AMPUTATION Right  01/12/2015   Procedure: PARTIAL AMPUTATION 1ST RAY RIGHT FOOT (Amputation of right great toe and portion of the first metatarsal bone right foot);  Surgeon: Caprice Beaver, DPM;  Location: [facility];  Service: Podiatry;  Laterality: Right;  . AMPUTATION Right 09/14/2016   Procedure: PARTIAL AMPUTATION RAY 2ND TOE RIGHT FOOT;  Surgeon: Caprice Beaver, DPM;  Location: [facility];  Service: Podiatry;  Laterality: Right;  . COLONOSCOPY  08/2008  . ESOPHAGOGASTRODUODENOSCOPY  08/2008  . LAMINOTOMY  2001, 2006     Right L4-5 interlaminar laminotomy with excision of herniated   disc with operating microscope.  Marland Kitchen NECK SURGERY     cervical disc  . ORIF WRIST FRACTURE Right   . PERIPHERAL VASCULAR CATHETERIZATION N/A 02/27/2015   Procedure: Abdominal Aortogram;  Surgeon: Elam Dutch, MD;  Location: [facility];  Service: Cardiovascular;  Laterality: N/A;  . PERIPHERAL VASCULAR CATHETERIZATION Bilateral 02/27/2015   Procedure: Lower Extremity Angiography;  Surgeon: Elam Dutch, MD;  Location: [place];  Service: Cardiovascular;  Laterality: Bilateral;  . Right ankle fracture open reduction internal fixation.  2003  . WOUND DEBRIDEMENT Right 12/24/2014   Procedure: DEBRIDEMENT SOFT TISSUE AND BONE RIGHT FOOT;  Surgeon: Caprice Beaver, DPM;  Location: [facility];  Service: Podiatry;  Laterality: Right;    Family History  Problem Relation Age of Onset  . Cancer Mother   .  Cancer Father   . Diabetes Father   . Cancer Sister   . Diabetes Sister   . Heart disease Sister        before age 20    Social History   Socioeconomic History  . Marital status: Married    Spouse name: Not on file  . Number of children: Not on file  . Years of education: Not on file  . Highest education level: Not on file  Occupational History  . Not on file  Social Needs  . Financial resource strain: Not on file  . Food insecurity:    Worry: Not on file    Inability: Not on file  .  Transportation needs:    Medical: Not on file    Non-medical: Not on file  Tobacco Use  . Smoking status: Former Smoker    Packs/day: 1.00    Years: 10.00    Pack years: 10.00    Types: Cigarettes    Last attempt to quit: 12/23/1970    Years since quitting: 47.7  . Smokeless tobacco: Never Used  . Tobacco comment: quit in 1979  Substance and Sexual Activity  . Alcohol use: No    Alcohol/week: 0.0 standard drinks  . Drug use: Yes    Types: Marijuana    Comment: in the past  . Sexual activity: Yes    Birth control/protection: None  Lifestyle  . Physical activity:    Days per week: Not on file    Minutes per session: Not on file  . Stress: Not on file  Relationships  . Social connections:    Talks on phone: Not on file    Gets together: Not on file    Attends religious service: Not on file    Active member of club or organization: Not on file    Attends meetings of clubs or organizations: Not on file    Relationship status: Not on file  . Intimate partner violence:    Fear of current or ex partner: Not on file    Emotionally abused: Not on file    Physically abused: Not on file    Forced sexual activity: Not on file  Other Topics Concern  . Not on file  Social History Narrative    He is married and has 3 kids.  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  He denies any tobacco or alcohol use.     Outpatient Medications Prior to Visit  Medication Sig Dispense Refill  . aspirin EC 81 MG tablet Take 81 mg by mouth daily.    . B-D ULTRAFINE III SHORT PEN 31G X 8 MM MISC USE PEN NEEDLES DAILY WITH LANTUS 100 each 5  . dapagliflozin propanediol (FARXIGA) 10 MG TABS tablet Take 10 mg by mouth daily. 90 tablet 3  . Insulin Detemir (LEVEMIR FLEXTOUCH) 100 UNIT/ML Pen 56 units qhs- MAY TITRATE UP TO 70 UNITS INTO SKIN AT BEDTIME FOR DIABETES 30 mL 5  . ondansetron (ZOFRAN) 8 MG tablet Take 1 tablet (8 mg total) by mouth every 8 (eight) hours as needed for nausea. 15 tablet  2  . oxyCODONE-acetaminophen (PERCOCET) 10-325 MG tablet Take 1 tablet by mouth every 4 (four) hours as needed for pain. As needed for pain 150 tablet 0  . rosuvastatin (CRESTOR) 5 MG tablet Take 1 tablet (5 mg total) by mouth at bedtime. 90 tablet 1  . senna-docusate (SENOKOT-S) 8.6-50 MG tablet Take 1 tablet by  mouth at bedtime as needed for mild constipation. 30 tablet 0   No facility-administered medications prior to visit.     Allergies  Allergen Reactions  . Metformin And Related     GI symptoms  . Statins Other (See Comments)    Legs ache, he states Crestor cause shoulder pain  . Vicodin [Hydrocodone-Acetaminophen] Nausea Only    ROS Review of Systems  Constitutional: Negative for diaphoresis and fatigue.  HENT: Negative for congestion and rhinorrhea.   Respiratory: Negative for cough and shortness of breath.   Cardiovascular: Negative for chest pain and leg swelling.  Gastrointestinal: Negative for abdominal pain and diarrhea.  Skin: Negative for color change and rash.  Neurological: Positive for facial asymmetry, speech difficulty, weakness and numbness. Negative for dizziness and headaches.  Psychiatric/Behavioral: Negative for behavioral problems and confusion.      Objective:    Physical Exam  Constitutional: He appears well-nourished. No distress.  HENT:  Head: Normocephalic and atraumatic.  Eyes: Right eye exhibits no discharge. Left eye exhibits no discharge.  Neck: No tracheal deviation present.  Cardiovascular: Normal rate, regular rhythm and normal heart sounds.  No murmur heard. Pulmonary/Chest: Effort normal and breath sounds normal. No respiratory distress. He has no wheezes.  Musculoskeletal: He exhibits no edema.  Lymphadenopathy:    He has no cervical adenopathy.  Neurological: He is alert.  Skin: Skin is warm. No rash noted.  Psychiatric: His behavior is normal.  Vitals reviewed.  He has right-sided facial weakness.  He also has weakness in  his leg as well as arm.  Also has some slowness to his speech And his gait is very narrow with short steps BP (!) 158/80   Pulse 87   Ht 6\' 3"  (1.905 m)   Wt 250 lb (113.4 kg)   SpO2 97%   BMI 31.25 kg/m  Wt Readings from Last 3 Encounters:  09/18/18 240 lb (108.9 kg)  09/18/18 250 lb (113.4 kg)  05/29/18 250 lb 3.2 oz (113.5 kg)      Assessment & Plan:  Initially this patient had scheduled this visit as a follow-up on his diabetes, hyperlipidemia, chronic pain  He presents with the possibility of a evolving stroke He appears to be outside the 4-hour window on a stroke but nonetheless he needs emergent evaluation and work-up  It is possible this could be a stroke or even possibility of a intracerebral hemorrhage causing this.  ER was called and spoke with.  They stated they will be expecting him.  Upon release from the hospital we will do close follow-up to help with transitional care as well as addressing his usual chronic health issues that were to be addressed today.  25 minutes was spent with the patient.  This statement verifies that 25 minutes was indeed spent with the patient.  More than 50% of this visit-total duration of the visit-was spent in counseling and coordination of care. The issues that the patient came in for today as reflected in the diagnosis (s) please refer to documentation for further details.   Sallee Lange, MD

## 2018-09-18 NOTE — ED Triage Notes (Signed)
Pt c/o slurred speech, difficulty swallowing, and r sided weakness since yesterday morning.  Pt went to PCP office today and sent here for eval of stroke symptoms.  Denies pain.

## 2018-09-19 LAB — BASIC METABOLIC PANEL
Anion gap: 9 (ref 5–15)
BUN: 18 mg/dL (ref 8–23)
CO2: 27 mmol/L (ref 22–32)
CREATININE: 0.9 mg/dL (ref 0.61–1.24)
Calcium: 8.9 mg/dL (ref 8.9–10.3)
Chloride: 104 mmol/L (ref 98–111)
GFR calc Af Amer: >60 mL/min (ref >60)
GFR calc non Af Amer: >60 mL/min (ref >60)
Glucose, Bld: 112 mg/dL — ABNORMAL HIGH (ref 70–99)
Potassium: 3.8 mmol/L (ref 3.5–5.1)
Sodium: 140 mmol/L (ref 135–145)

## 2018-09-19 LAB — CBC
HCT: 41.1 % (ref 39.0–52.0)
Hemoglobin: 13.1 g/dL (ref 13.0–17.0)
MCH: 28.3 pg (ref 26.0–34.0)
MCHC: 31.9 g/dL (ref 30.0–36.0)
MCV: 88.8 fL (ref 80.0–100.0)
PLATELETS: 253 10*3/uL (ref 150–400)
RBC: 4.63 MIL/uL (ref 4.22–5.81)
RDW: 12.5 % (ref 11.5–15.5)
WBC: 8.7 10*3/uL (ref 4.0–10.5)
nRBC: 0 % (ref 0.0–0.2)

## 2018-09-19 LAB — LIPID PANEL
Cholesterol: 152 mg/dL (ref 0–200)
HDL: 30 mg/dL — ABNORMAL LOW (ref >40)
LDL Cholesterol: 101 mg/dL — ABNORMAL HIGH (ref 0–99)
Total CHOL/HDL Ratio: 5.1 RATIO
Triglycerides: 106 mg/dL (ref <150)
VLDL: 21 mg/dL (ref 0–40)

## 2018-09-19 LAB — GLUCOSE, CAPILLARY
Glucose-Capillary: 121 mg/dL — ABNORMAL HIGH (ref 70–99)
Glucose-Capillary: 118 mg/dL — ABNORMAL HIGH (ref 70–99)

## 2018-09-19 LAB — HEMOGLOBIN A1C
Hgb A1c MFr Bld: 7.2 % — ABNORMAL HIGH (ref 4.8–5.6)
Mean Plasma Glucose: 159.94 mg/dL

## 2018-09-19 LAB — HIV ANTIBODY (ROUTINE TESTING W REFLEX): HIV Screen 4th Generation wRfx: NONREACTIVE

## 2018-09-19 MED ORDER — ASPIRIN 325 MG PO TBEC: 325.0000 mg | DELAYED_RELEASE_TABLET | Freq: Every day | ORAL | 3 refills | Status: AC

## 2018-09-19 MED ORDER — ROSUVASTATIN CALCIUM 10 MG PO TABS: 10.0000 mg | ORAL_TABLET | Freq: Every day | ORAL | 3 refills | Status: DC

## 2018-09-19 NOTE — Care Management Note (Signed)
Case Management Note  Patient Details  Name: [name] MRN: [id number] Date of Birth: [birth year]  Subjective/Objective: Stroke. From home, independent PTA. Family at bedside.Recommended for [facility] PT and OT. SLP recommends [facility] SLP. Patient elects to do all OP Therapies at [place] rehab.                    Action/Plan: DC home.   Expected Discharge Date:   09/19/2018               Expected Discharge Plan:  OP Rehab  In-House Referral:     Discharge planning Services  CM Consult  Post Acute Care Choice:    Choice offered to:  Patient  DME Arranged:    DME Agency:     HH Arranged:    [place] Agency:     Status of Service:  Completed, signed off  If discussed at [employer] of Stay Meetings, dates discussed:    Additional Comments:  Nyaira Hodgens, Chauncey [place], RN 09/19/2018, 10:54 AM

## 2018-09-19 NOTE — Evaluation (Signed)
Physical Therapy Evaluation Patient Details Name: [name] MRN: [id number] DOB: Oct 22, 1955 Today's Date: 09/19/2018   History of Present Illness  [name] is a 62 y.o. male with past medical history significant for systolic heart failure (last ejection fraction 40%), hypertension, hyperlipidemia, type 2 diabetes mellitus and class I obesity; who presented to ED from PCP office secondary to right-sided weakness, facial droop and dysarthria.  According to the patient he has these symptoms of weakness and just not feeling himself on 09/17/18 in the morning and instead of seeking for medical attention he decided to go to bed and hope that all the symptoms will improve on their own. MRI of the brain demonstrated acute 18 mm infarct in the left corona radiata with no associated hemorrhage or mass-effect.  She has been admitted for stroke work-up.    Clinical Impression  Patient functioning near baseline for functional mobility and gait.  Patient can safely ambulate in hallways on level, inclined, declined surfaces without loss of balance, slightly unsteady during sit to stands requiring use of BUE to push from bed/chair to complete, did not require use of AD and tolerated sitting up in chair after therapy.  Plan:  Patient discharged from physical therapy to care of nursing for ambulation daily as tolerated for length of stay and recommendation below.    Follow Up Recommendations Outpatient PT    Equipment Recommendations  None recommended by PT    Recommendations for Other Services       Precautions / Restrictions Precautions Precautions: None Restrictions Weight Bearing Restrictions: No      Mobility  Bed Mobility Overal bed mobility: Modified Independent                Transfers Overall transfer level: Needs assistance Equipment used: None Transfers: Sit to/from Stand;Stand Pivot Transfers Sit to Stand: Supervision Stand pivot transfers: Modified independent  (Device/Increase time)       General transfer comment: requires increased time for sit to stands  Ambulation/Gait Ambulation/Gait assistance: Modified independent (Device/Increase time) Gait Distance (Feet): 150 Feet Assistive device: None Gait Pattern/deviations: Decreased stride length;Decreased step length - right Gait velocity: decreased   General Gait Details: slightly labored cadence with excessive external rotation right foot which is baseline per patient due to knee surgery, no loss of balance on level, inclined or declined surfaces  Stairs            Wheelchair Mobility    Modified Rankin (Stroke Patients Only)       Balance Overall balance assessment: Mild deficits observed, not formally tested                                           Pertinent Vitals/Pain Pain Assessment: 0-10 Pain Score: 5  Pain Location: generalized pain all over, but mostly in legs Pain Descriptors / Indicators: Discomfort;Sore Pain Intervention(s): Limited activity within patient's tolerance;Monitored during session    [place] expects to be discharged to:: Private residence Living Arrangements: Spouse/significant other;Children Available Help at Discharge: Family;Available PRN/intermittently Type of Home: Mobile home Home Access: Stairs to enter Entrance Stairs-Rails: None Entrance Stairs-Number of Steps: 2 Home Layout: One level Home Equipment: Walker - 2 wheels;Shower seat;Cane - single point;Bedside commode      Prior Function Level of Independence: Independent         Comments: Hydrographic surveyor, drives     Hand  Dominance   Dominant Hand: Right    Extremity/Trunk Assessment   Upper Extremity Assessment Upper Extremity Assessment: Defer to OT evaluation RUE Deficits / Details: RUE strength at 4/5 compared to 5/5 on LUE RUE Sensation: WNL RUE Coordination: decreased fine motor    Lower Extremity Assessment Lower  Extremity Assessment: Overall WFL for tasks assessed    Cervical / Trunk Assessment Cervical / Trunk Assessment: Normal  Communication   Communication: No difficulties  Cognition Arousal/Alertness: Awake/alert Behavior During Therapy: WFL for tasks assessed/performed Overall Cognitive Status: Within Functional Limits for tasks assessed                                        General Comments      Exercises     Assessment/Plan    PT Assessment All further PT needs can be met in the next venue of care  PT Problem List Decreased activity tolerance;Decreased mobility;Decreased balance       PT Treatment Interventions      PT Goals (Current goals can be found in the Care Plan section)  Acute Rehab PT Goals Patient Stated Goal: return home with family to assist PT Goal Formulation: With patient Time For Goal Achievement: 09/19/18 Potential to Achieve Goals: Good    Frequency     Barriers to discharge        Co-evaluation               AM-PAC PT "6 Clicks" Mobility  Outcome Measure Help needed turning from your back to your side while in a flat bed without using bedrails?: None Help needed moving from lying on your back to sitting on the side of a flat bed without using bedrails?: None Help needed moving to and from a bed to a chair (including a wheelchair)?: None Help needed standing up from a chair using your arms (e.g., wheelchair or bedside chair)?: None Help needed to walk in hospital room?: A Little Help needed climbing 3-5 steps with a railing? : A Little 6 Click Score: 22    End of Session Equipment Utilized During Treatment: Gait belt Activity Tolerance: Patient tolerated treatment well Patient left: in chair;with call bell/phone within reach Nurse Communication: Mobility status PT Visit Diagnosis: Unsteadiness on feet (R26.81);Other abnormalities of gait and mobility (R26.89);Muscle weakness (generalized) (M62.81)    Time:  5397-6734 PT Time Calculation (min) (ACUTE ONLY): 24 min   Charges:   PT Evaluation $PT Eval Moderate Complexity: 1 Mod PT Treatments $Therapeutic Activity: 23-37 mins        9:05 AM, 09/19/18 Lonell Grandchild, MPT Physical Therapist with [facility] [phone] office [phone] mobile phone

## 2018-09-19 NOTE — Evaluation (Signed)
Speech Language Pathology Evaluation Patient Details Name: [name] MRN: [id number] DOB: [birth year] Today's Date: 09/19/2018 Time: 0272-5366 SLP Time Calculation (min) (ACUTE ONLY): 34 min  Problem List:  Patient Active Problem List   Diagnosis Date Noted  . Stroke ([place]) 09/18/2018  . History of amputation of great toe ([place]) 02/20/2017  . Diabetic foot infection ([place]) 09/13/2016  . Peripheral vascular disease ([place])   . Hypercholesterolemia   . Diabetes mellitus ([place])   . Atherosclerotic PVD with ulceration ([place]) 08/06/2014  . Osteoarthritis of both knees 03/12/2014  . Hyperlipidemia 11/28/2013  . Chronic pain syndrome 11/28/2013  . Atherosclerotic peripheral vascular disease with ulceration ([place]) 11/28/2013  . Diabetic peripheral neuropathy ([place]) 11/28/2013  . Diabetic foot ulcer ([place]) 07/22/2013  . Type 2 diabetes mellitus ([place]) 02/20/2013  . Pain in joint, shoulder region 04/16/2012  . Muscle weakness (generalized) 04/16/2012  . Rotator cuff syndrome of left shoulder 04/16/2012  . Coronary atherosclerosis of native coronary artery 03/09/2011   Past Medical History:  Past Medical History:  Diagnosis Date  . CAD (coronary artery disease)    status post PCI and bare metal stenting of   the proximal right coronary artery with placement of a Driver bare   metal stent.   . CHF (congestive heart failure) ([place])   . Depression   . Diabetes mellitus   . DJD (degenerative joint disease)   . First degree AV block    with intermittent second degree types I and    II heart block.   . Gout   . Heart attack ([place]) 2008  . Heart murmur 1974  . HTN (hypertension)   . Hypercholesterolemia   . Hyperlipidemia   . Myocardial infarction ([place])   . Obesity   . Oral candidiasis   . Peripheral vascular disease [facility])    Past Surgical History:  Past Surgical History:  Procedure Laterality Date  . AMPUTATION Right 01/12/2015   Procedure: PARTIAL AMPUTATION 1ST RAY RIGHT FOOT (Amputation  of right great toe and portion of the first metatarsal bone right foot);  Surgeon: Caprice Beaver, DPM;  Location: [facility];  Service: Podiatry;  Laterality: Right;  . AMPUTATION Right 09/14/2016   Procedure: PARTIAL AMPUTATION RAY 2ND TOE RIGHT FOOT;  Surgeon: Caprice Beaver, DPM;  Location: [facility];  Service: Podiatry;  Laterality: Right;  . COLONOSCOPY  08/2008  . ESOPHAGOGASTRODUODENOSCOPY  08/2008  . LAMINOTOMY  2001, 2006     Right L4-5 interlaminar laminotomy with excision of herniated   disc with operating microscope.  Marland Kitchen NECK SURGERY     cervical disc  . ORIF WRIST FRACTURE Right   . PERIPHERAL VASCULAR CATHETERIZATION N/A 02/27/2015   Procedure: Abdominal Aortogram;  Surgeon: Elam Dutch, MD;  Location: [place] [facility];  Service: Cardiovascular;  Laterality: N/A;  . PERIPHERAL VASCULAR CATHETERIZATION Bilateral 02/27/2015   Procedure: Lower Extremity Angiography;  Surgeon: Elam Dutch, MD;  Location: [place] [facility];  Service: Cardiovascular;  Laterality: Bilateral;  . Right ankle fracture open reduction internal fixation.  2003  . WOUND DEBRIDEMENT Right 12/24/2014   Procedure: DEBRIDEMENT SOFT TISSUE AND BONE RIGHT FOOT;  Surgeon: Caprice Beaver, DPM;  Location: [facility];  Service: Podiatry;  Laterality: Right;   HPI:  [name] is a 62 y.o. male with past medical history significant for systolic heart failure (last ejection fraction 40%), hypertension, hyperlipidemia, type 2 diabetes mellitus and class I obesity; who presented to ED from PCP office secondary to right-sided weakness, facial  droop and dysarthria.  According to the patient he has these symptoms of weakness and just not feeling himself on 09/17/18 in the morning and instead of seeking for medical attention he decided to go to bed and hope that all the symptoms will improve on their own.  Today he had an appointment with his PCP and after being evaluated he was sent to the emergency department acutely  due to the concerns of ongoing stroke.  In the ED patient was in no acute distress; denies chest pain, no nausea, no vomiting, no fever, no chills, no hematuria, no dysuria, no melena, no hematochezia, no shortness of breath or any other complaints. He was found with acute right facial droop, dysarthria and right-sided weakness (affecting more his right upper extremity than his right lower extremity).  CBG was 184, the rest of his blood work was unremarkable.  UDS positive for marijuana and opiates.  No signs of acute infection in his urinalysis.  MRI of the brain demonstrated acute 18 mm infarct in the left corona radiata with no associated hemorrhage or mass-effect.   Assessment / Plan / Recommendation Clinical Impression  Pt presents with mild expressive aphasia, mild dysarthria and mild cognitive impairment. According to patient and Pt's daughter (present for evaluation) his speech is more "clear" today and his ability to find his words has also improved since onset of symptoms. Expressive aphasia is characterized by mild impairment in naming pictures and objects and mild impairment in word finding in conversation; occasional paraphasias noted of which Pt is aware and self-corrects. Dysarthria is characterized by decreased articulatory precision as a result of R facial droop and R labial weakness; mildly decreased intelligibility (~80% intelligible). Pt demonstrates mild cognitive deficits in the areas of short term memory, delayed recall and executive functioning; Pt reports he still drives and intends to return to doing so. Pt did agree to wait until he sees his PCP prior to resuming driving. Pt will benefit from OP ST evaluation and treatment as indicated in the case that Pt's symptoms do not spontaneously improve.    Pt was also provided skilled dysphagia therapy targeting trials of thin liquids; Pt consumed 100 cc's of thin liquids with no overt s/sx of aspiration however note decreased oral  containment of thin liquids on the R; this is frustrating for Pt and he reports he would also like continued OP ST to target oral and labial strengthening.     SLP Assessment  SLP Recommendation/Assessment: Patient needs continued Speech Lanaguage Pathology Services SLP Visit Diagnosis: Aphasia (R47.01);Cognitive communication deficit (R41.841);Dysarthria and anarthria (R47.1)    Follow Up Recommendations  Outpatient SLP    Frequency and Duration min 1 x/week  1 week      SLP Evaluation Cognition  Overall Cognitive Status: Impaired/Different from baseline Executive Function: Sequencing;Organizing Sequencing: Impaired Organizing: Impaired       Comprehension  Auditory Comprehension Overall Auditory Comprehension: Appears within functional limits for tasks assessed Visual Recognition/Discrimination Discrimination: Not tested Reading Comprehension Reading Status: Not tested    Expression Expression Primary Mode of Expression: Verbal Verbal Expression Overall Verbal Expression: Impaired Initiation: No impairment Naming: Impairment Responsive: 76-100% accurate Written Expression Dominant Hand: Right   Oral / Motor  Oral Motor/Sensory Function Overall Oral Motor/Sensory Function: Mild impairment Facial ROM: Reduced right;Suspected CN VII (facial) dysfunction Facial Symmetry: Abnormal symmetry right;Suspected CN VII (facial) dysfunction Facial Strength: Reduced right;Suspected CN VII (facial) dysfunction Facial Sensation: Within Functional Limits Lingual ROM: Within Functional Limits Lingual Symmetry: Within Functional Limits  Lingual Strength: Within Functional Limits Lingual Sensation: Within Functional Limits Velum: Within Functional Limits Mandible: Within Functional Limits Motor Speech Overall Motor Speech: Impaired Intelligibility: Intelligibility reduced Word: 75-100% accurate Phrase: 75-100% accurate Sentence: 75-100% accurate Conversation: 75-100%  accurate Motor Speech Errors: Aware      Anjani Feuerborn H. Roddie Mc, CCC-SLP Speech Language Pathologist            Wende Bushy 09/19/2018, 3:03 PM

## 2018-09-19 NOTE — Progress Notes (Signed)
Discharge instructions reviewed with patient and patients daughter.  Both verbalized understanding of instructions.  Patient discharged home with daughter in stable condition.

## 2018-09-19 NOTE — Evaluation (Signed)
Occupational Therapy Evaluation Patient Details Name: [name] MRN: [id number] DOB: August 12, 1956 Today's Date: 09/19/2018    History of Present Illness [name] is a 62 y.o. male with past medical history significant for systolic heart failure (last ejection fraction 40%), hypertension, hyperlipidemia, type 2 diabetes mellitus and class I obesity; who presented to ED from PCP office secondary to right-sided weakness, facial droop and dysarthria.  According to the patient he has these symptoms of weakness and just not feeling himself on 09/17/18 in the morning and instead of seeking for medical attention he decided to go to bed and hope that all the symptoms will improve on their own. MRI of the brain demonstrated acute 18 mm infarct in the left corona radiata with no associated hemorrhage or mass-effect.  She has been admitted for stroke work-up.   Clinical Impression   Pt received supine in bed, agreeable to OT evaluation this am. Pt reports feeling much improved with exception of drooling and coordination issue in right hand. Pt demonstrates RUE strength WFL, RUE slightly weaker than LUE at 4/5 to 4+/5. Pt with slight fine motor coordination deficits requiring increased time for task completion, however he is able to use right hand fairly well during ADLs. Discussed progress since onset of symptoms with pt and provided pt with coordination exercises to complete for improved fine motor coordination. Recommend outpatient OT for fine motor coordination improvement if pt continues to have difficulty and chooses to pursue treatment. No further OT services required at this time.     Follow Up Recommendations  Outpatient OT(if pt would like to pursue)    Equipment Recommendations  None recommended by OT       Precautions / Restrictions Precautions Precautions: None Restrictions Weight Bearing Restrictions: No      Mobility Bed Mobility Overal bed mobility: Modified Independent                 Transfers Overall transfer level: Needs assistance Equipment used: None Transfers: Sit to/from Stand;Stand Pivot Transfers Sit to Stand: Supervision Stand pivot transfers: Supervision                ADL either performed or assessed with clinical judgement   ADL Overall ADL's : Needs assistance/impaired     Grooming: Wash/dry face;Sitting;Modified independent Grooming Details (indicate cue type and reason): slightly increased time when using right hand due to fine motor coordination issues             Lower Body Dressing: Modified independent;Sitting/lateral leans   Toilet Transfer: Supervision/safety   Toileting- Clothing Manipulation and Hygiene: Modified independent;Sitting/lateral lean;Sit to/from stand               Vision Baseline Vision/History: Wears glasses Wears Glasses: At all times Patient Visual Report: No change from baseline Vision Assessment?: No apparent visual deficits            Pertinent Vitals/Pain Pain Assessment: No/denies pain     Hand Dominance Right   Extremity/Trunk Assessment Upper Extremity Assessment Upper Extremity Assessment: Overall WFL for tasks assessed;RUE deficits/detail RUE Deficits / Details: RUE strength at 4/5 compared to 5/5 on LUE RUE Sensation: WNL RUE Coordination: decreased fine motor   Lower Extremity Assessment Lower Extremity Assessment: Defer to PT evaluation   Cervical / Trunk Assessment Cervical / Trunk Assessment: Normal   Communication Communication Communication: No difficulties   Cognition Arousal/Alertness: Awake/alert Behavior During Therapy: WFL for tasks assessed/performed Overall Cognitive Status: Within Functional Limits for tasks assessed  Home Living Family/patient expects to be discharged to:: Private residence Living Arrangements: Spouse/significant other;Children Available Help at Discharge:  Family;Available PRN/intermittently Type of Home: Mobile home Home Access: Stairs to enter Entrance Stairs-Number of Steps: 1 Entrance Stairs-Rails: None Home Layout: One level     Bathroom Shower/Tub: Teacher, early years/pre: Standard     Home Equipment: Environmental consultant - 2 wheels;Shower seat          Prior Functioning/Environment Level of Independence: Independent        Comments: Pt independent in ADLs, working, driving        OT Problem List: Decreased strength;Decreased activity tolerance;Decreased coordination;Impaired UE functional use       End of Session    Activity Tolerance: Patient tolerated treatment well Patient left: in chair;with call bell/phone within reach  OT Visit Diagnosis: Muscle weakness (generalized) (M62.81);Hemiplegia and hemiparesis Hemiplegia - Right/Left: Right Hemiplegia - dominant/non-dominant: Dominant Hemiplegia - caused by: Cerebral infarction                Time: 8177-1165 OT Time Calculation (min): 24 min Charges:  OT General Charges $OT Visit: 1 Visit OT Evaluation $OT Eval Low Complexity: 1 Low   Guadelupe Sabin, OTR/L  [phone] 09/19/2018, 8:07 AM

## 2018-09-19 NOTE — Discharge Summary (Signed)
Physician Discharge Summary  [name] ION:[id number] DOB: Jun 18, 1956 DOA: 09/18/2018  PCP: Kathyrn Drown, MD  Admit date: 09/18/2018  Discharge date: 09/19/2018  Admitted From:Home  Disposition:  Home with recommendations for outpatient PT  Recommendations for Outpatient Follow-up:  1. Follow up with PCP in 1-2 weeks and get referral for outpatient ENT follow up for L parotid nodule as noted on MRI below 2. Follow-up to cardiology outpatient for stress testing regarding 2D echocardiogram findings; see below 3. Continue now on Crestor 10mg  instead of 5mg  4. Increase aspirin to 325mg  daily 5. Follow up with Dr. Merlene Laughter in 6 weeks  Home Health:None  Equipment/Devices:None  Discharge Condition:Stable  CODE STATUS: Full  Diet recommendation: Heart Healthy/Carb Modified on Dysphagia 3  Brief/Interim Summary: Per HPI from Dr. Dyann Kief: [name] is a 62 y.o. male with past medical history significant for systolic heart failure (last ejection fraction 40%), hypertension, hyperlipidemia, type 2 diabetes mellitus and class I obesity; who presented to ED from PCP office secondary to right-sided weakness, facial droop and dysarthria.  According to the patient he has these symptoms of weakness and just not feeling himself on 09/17/18 in the morning and instead of seeking for medical attention he decided to go to bed and hope that all the symptoms will improve on their own.  Today he has an appointment with his PCP and after being evaluated he was sent to the emergency department acutely at due to the concerns of ongoing stroke.  In the ED patient was in no acute distress; denies chest pain, no nausea, no vomiting, no fever, no chills, no hematuria, no dysuria, no melena, no hematochezia, no shortness of breath or any other complaints.  He was found with acute right facial droop, dysarthria and right-sided weakness (affecting more his right upper extremity than his right lower extremity).   CBG was 184, the rest of his blood work was unremarkable.  UDS positive for marijuana and opiates.  No signs of acute infection in his urinalysis.  MRI of the brain demonstrated acute 18 mm infarct in the left corona radiata with no associated hemorrhage or mass-effect.  She has been admitted for stroke work-up.  Patient was admitted for stroke work-up and was noted to have an 18 mm CVA to the left corona radiata.  Echo demonstrates some diffuse hypokinesis with LVEF 30 to 35% and grade 2 diastolic dysfunction and carotid Dopplers with mild stenosis.  LDL was elevated over 100.  He will have his Crestor increased to 10 mg daily and aspirin increased to 325 mg daily.  He will remain on dysphagia 3 diet.  He has been evaluated by PT and OT with recommendations for outpatient physical therapy.  He is also noted to have a parotid gland mass which will need further evaluation by ENT in the outpatient setting.  No other acute events noted during this brief admission.  Follow-up with Dr. Merlene Laughter as recommended in 6 weeks.  Discharge Diagnoses:  Active Problems:   Stroke [facility])  Principal discharge diagnosis: Acute CVA of left corona radiata.  Discharge Instructions  Discharge Instructions    Ambulatory referral to Occupational Therapy   Complete by:  As directed    Ambulatory referral to Physical Therapy   Complete by:  As directed    Ambulatory referral to Speech Therapy   Complete by:  As directed    Diet - low sodium heart healthy   Complete by:  As directed    Increase activity slowly  Complete by:  As directed      Allergies as of 09/19/2018      Reactions   Metformin And Related    GI symptoms   Statins Other (See Comments)   Legs ache, he states Crestor cause shoulder pain   Vicodin [hydrocodone-acetaminophen] Nausea Only      Medication List    TAKE these medications   aspirin 325 MG EC tablet Take 1 tablet (325 mg total) by mouth daily. What changed:    medication  strength  how much to take   B-D ULTRAFINE III SHORT PEN 31G X 8 MM Misc Generic drug:  Insulin Pen Needle USE PEN NEEDLES DAILY WITH LANTUS   dapagliflozin propanediol 10 MG Tabs tablet Commonly known as:  FARXIGA Take 10 mg by mouth daily.   Insulin Detemir 100 UNIT/ML Pen Commonly known as:  LEVEMIR 56 units qhs- MAY TITRATE UP TO 70 UNITS INTO SKIN AT BEDTIME FOR DIABETES   oxyCODONE-acetaminophen 10-325 MG tablet Commonly known as:  PERCOCET Take 1 tablet by mouth every 4 (four) hours as needed for pain. As needed for pain   rosuvastatin 10 MG tablet Commonly known as:  CRESTOR Take 1 tablet (10 mg total) by mouth at bedtime. What changed:    medication strength  how much to take   senna-docusate 8.6-50 MG tablet Commonly known as:  Senokot-S Take 1 tablet by mouth at bedtime as needed for mild constipation.      Follow-up Information    Kathyrn Drown, MD Follow up in 1 week(s).   Specialty:  Family Medicine Contact information: [place] [address] [phone]        Phillips Odor, MD Follow up in 6 week(s).   Specialty:  Neurology Contact information: [address] [place] [address] [phone]          Allergies  Allergen Reactions  . Metformin And Related     GI symptoms  . Statins Other (See Comments)    Legs ache, he states Crestor cause shoulder pain  . Vicodin [Hydrocodone-Acetaminophen] Nausea Only    Consultations:  Neurology Dr. Merlene Laughter over phone   Procedures/Studies: Dg Chest 2 View  Result Date: 09/18/2018 CLINICAL DATA:  Stroke. EXAM: CHEST - 2 VIEW COMPARISON:  01/03/2015. FINDINGS: Mediastinum and hilar structures normal. Lungs are clear. No pleural effusion or pneumothorax. Heart size normal. No acute bony abnormality. Cervical spine fusion. IMPRESSION: Cardiomegaly. No pulmonary venous congestion. No acute pulmonary disease. Electronically Signed   By: Marcello Moores  Register   On:  09/18/2018 11:58   Mr Jodene Nam Head Wo Contrast  Result Date: 09/18/2018 CLINICAL DATA:  62 year old male with slurred speech and confusion x1 day. EXAM: MRI HEAD WITHOUT CONTRAST MRA HEAD WITHOUT CONTRAST TECHNIQUE: Multiplanar, multiecho pulse sequences of the brain and surrounding structures were obtained without intravenous contrast. Angiographic images of the head were obtained using MRA technique without contrast. COMPARISON:  Cervical spine MRI 02/08/2011. FINDINGS: MRI HEAD FINDINGS Brain: 18 millimeter area of confluent restricted diffusion in the left corona radiata (series 4, image 38) with T2 and FLAIR hyperintensity. No associated acute hemorrhage or mass effect. No other convincing restricted diffusion. There is confluent bilateral cerebral white matter T2 and FLAIR hyperintensity elsewhere. There are numerous chronic micro hemorrhages scattered in the brain, most appear to be cortical or at the gray-white junction. There is a slightly larger macroscopic scratched at there is a slightly larger more macroscopic area of hemosiderin in the posterior left  parietal lobe on series 15, image 16. No definite cortical encephalomalacia. The deep gray matter nuclei, brainstem, and cerebellum appear relatively spared and within normal limits for age. No midline shift, mass effect, evidence of mass lesion, ventriculomegaly, extra-axial collection or acute intracranial hemorrhage. Cervicomedullary junction and pituitary are within normal limits. Vascular: Major intracranial vascular flow voids are preserved. The left vertebral artery appears dominant. Skull and upper cervical spine: Negative visible cervical spine. Visualized bone marrow signal is within normal limits. Sinuses/Orbits: Negative orbits. Trace paranasal sinus mucosal thickening. Other: Mastoids are clear. Visible internal auditory structures appear normal. There is a 14 millimeter T2 hypointense soft tissue nodule in the posterior right parotid gland  with mild T1 hyperintensity (series 2, image 2 and series 16, image 17. Other scalp and face soft tissues appear negative. MRA HEAD FINDINGS Antegrade flow in the posterior circulation with dominant appearing distal left vertebral artery. No distal vertebral stenosis. Patent left PICA origin. The right AICA may be dominant. Patent vertebrobasilar junction and basilar artery without stenosis. Normal SCA and PCA origins. Both posterior communicating arteries are present. Bilateral PCA branches are within normal limits. Antegrade flow in both ICA siphons. No siphon stenosis. Normal ophthalmic and posterior communicating artery origins. Patent carotid termini. The left A1 segment appears mildly dominant. Anterior communicating artery and visible ACA branches are within normal limits. Right MCA M1 segment, bifurcation, and visible right MCA branches are within normal limits. The left MCA M1 is mildly irregular but without stenosis. The left MCA bifurcation is patent. The left MCA branches are mildly irregular but patent with no branch occlusion identified. IMPRESSION: 1. Acute 18 mm infarct in the left corona radiata. No associated hemorrhage or mass effect. 2. Mild irregularity of the left MCA branches, with no branch occlusion identified, and otherwise negative intracranial MRA. 3. Advanced chronic micro-hemorrhages and cerebral white matter signal changes in the brain while the deep gray matter nuclei, brainstem and cerebellum are relatively spared. Consider the possibility of Amyloid Angiopathy. 4. 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 Recommend outpatient ENT follow-up. Electronically Signed   By: Genevie Ann M.D.   On: 09/18/2018 10:16   Mr Brain Wo Contrast  Result Date: 09/18/2018 CLINICAL DATA:  62 year old male with slurred speech and confusion x1 day. EXAM: MRI HEAD WITHOUT CONTRAST MRA HEAD WITHOUT CONTRAST TECHNIQUE: Multiplanar, multiecho pulse sequences of  the brain and surrounding structures were obtained without intravenous contrast. Angiographic images of the head were obtained using MRA technique without contrast. COMPARISON:  Cervical spine MRI 02/08/2011. FINDINGS: MRI HEAD FINDINGS Brain: 18 millimeter area of confluent restricted diffusion in the left corona radiata (series 4, image 38) with T2 and FLAIR hyperintensity. No associated acute hemorrhage or mass effect. No other convincing restricted diffusion. There is confluent bilateral cerebral white matter T2 and FLAIR hyperintensity elsewhere. There are numerous chronic micro hemorrhages scattered in the brain, most appear to be cortical or at the gray-white junction. There is a slightly larger macroscopic scratched at there is a slightly larger more macroscopic area of hemosiderin in the posterior left parietal lobe on series 15, image 16. No definite cortical encephalomalacia. The deep gray matter nuclei, brainstem, and cerebellum appear relatively spared and within normal limits for age. No midline shift, mass effect, evidence of mass lesion, ventriculomegaly, extra-axial collection or acute intracranial hemorrhage. Cervicomedullary junction and pituitary are within normal limits. Vascular: Major intracranial vascular flow voids are preserved. The left vertebral artery appears dominant. Skull and  upper cervical spine: Negative visible cervical spine. Visualized bone marrow signal is within normal limits. Sinuses/Orbits: Negative orbits. Trace paranasal sinus mucosal thickening. Other: Mastoids are clear. Visible internal auditory structures appear normal. There is a 14 millimeter T2 hypointense soft tissue nodule in the posterior right parotid gland with mild T1 hyperintensity (series 2, image 2 and series 16, image 17. Other scalp and face soft tissues appear negative. MRA HEAD FINDINGS Antegrade flow in the posterior circulation with dominant appearing distal left vertebral artery. No distal vertebral  stenosis. Patent left PICA origin. The right AICA may be dominant. Patent vertebrobasilar junction and basilar artery without stenosis. Normal SCA and PCA origins. Both posterior communicating arteries are present. Bilateral PCA branches are within normal limits. Antegrade flow in both ICA siphons. No siphon stenosis. Normal ophthalmic and posterior communicating artery origins. Patent carotid termini. The left A1 segment appears mildly dominant. Anterior communicating artery and visible ACA branches are within normal limits. Right MCA M1 segment, bifurcation, and visible right MCA branches are within normal limits. The left MCA M1 is mildly irregular but without stenosis. The left MCA bifurcation is patent. The left MCA branches are mildly irregular but patent with no branch occlusion identified. IMPRESSION: 1. Acute 18 mm infarct in the left corona radiata. No associated hemorrhage or mass effect. 2. Mild irregularity of the left MCA branches, with no branch occlusion identified, and otherwise negative intracranial MRA. 3. Advanced chronic micro-hemorrhages and cerebral white matter signal changes in the brain while the deep gray matter nuclei, brainstem and cerebellum are relatively spared. Consider the possibility of Amyloid Angiopathy. 4. 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 Recommend outpatient ENT follow-up. Electronically Signed   By: Genevie Ann M.D.   On: 09/18/2018 10:16   US Carotid Bilateral (at [facility] And [facility] Only)  Result Date: 09/18/2018 CLINICAL DATA:  Acute left corona radiata stroke EXAM: BILATERAL CAROTID DUPLEX ULTRASOUND TECHNIQUE: Pearline Cables scale imaging, color Doppler and duplex ultrasound were performed of bilateral carotid and vertebral arteries in the neck. COMPARISON:  09/18/2018 FINDINGS: Criteria: Quantification of carotid stenosis is based on velocity parameters that correlate the residual internal carotid diameter with NASCET-based stenosis  levels, using the diameter of the distal internal carotid lumen as the denominator for stenosis measurement. The following velocity measurements were obtained: RIGHT ICA: 70/17 cm/sec CCA: 47/42 cm/sec SYSTOLIC ICA/CCA RATIO:  1.0 ECA: 109 cm/sec LEFT ICA: 63/22 cm/sec CCA: 595/63 cm/sec SYSTOLIC ICA/CCA RATIO:  0.6 ECA: 101 cm/sec RIGHT CAROTID ARTERY: Minor echogenic shadowing plaque formation. No hemodynamically significant right ICA stenosis, velocity elevation, or turbulent flow. Degree of narrowing less than 50%. RIGHT VERTEBRAL ARTERY:  Antegrade LEFT CAROTID ARTERY: Similar scattered minor echogenic plaque formation. No hemodynamically significant left ICA stenosis, velocity elevation, or turbulent flow. LEFT VERTEBRAL ARTERY:  Antegrade IMPRESSION: Minor carotid atherosclerosis. No hemodynamically significant ICA stenosis. Degree of narrowing less than 50% bilaterally by ultrasound criteria. Patent antegrade vertebral flow bilaterally Electronically Signed   By: Jerilynn Mages.  Shick M.D.   On: 09/18/2018 12:32   Discharge Exam: Vitals:   09/19/18 0513 09/19/18 0913  BP: 120/76 137/90  Pulse: 69 73  Resp:  20  Temp: 98.1 F (36.7 C) 98.1 F (36.7 C)  SpO2: 99% 98%   Vitals:   09/18/18 2310 09/19/18 0110 09/19/18 0513 09/19/18 0913  BP: 121/69 139/72 120/76 137/90  Pulse: 79 69 69 73  Resp: 20 20  20   Temp: 98.9 F (37.2 C) 98.9 F (37.2  C) 98.1 F (36.7 C) 98.1 F (36.7 C)  TempSrc: Oral Oral Oral Oral  SpO2: 100% 100% 99% 98%  Weight:      Height:        General: Pt is alert, awake, not in acute distress Cardiovascular: RRR, S1/S2 +, no rubs, no gallops Respiratory: CTA bilaterally, no wheezing, no rhonchi Abdominal: Soft, NT, ND, bowel sounds + Extremities: no edema, no cyanosis    The results of significant diagnostics from this hospitalization (including imaging, microbiology, ancillary and laboratory) are listed below for reference.     Microbiology: No results found for  this or any previous visit (from the past 240 hour(s)).   Labs: BNP (last 3 results) No results for input(s): BNP in the last 8760 hours. Basic Metabolic Panel: Recent Labs  Lab 09/18/18 0903 09/19/18 0501  NA 137 140  K 3.8 3.8  CL 103 104  CO2 23 27  GLUCOSE 189* 112*  BUN 17 18  CREATININE 0.96 0.90  CALCIUM 9.2 8.9   Liver Function Tests: Recent Labs  Lab 09/18/18 0903  AST 23  ALT 21  ALKPHOS 59  BILITOT 0.9  PROT 8.0  ALBUMIN 4.6   No results for input(s): LIPASE, AMYLASE in the last 168 hours. No results for input(s): AMMONIA in the last 168 hours. CBC: Recent Labs  Lab 09/18/18 0903 09/19/18 0501  WBC 8.9 8.7  NEUTROABS 6.6  --   HGB 13.7 13.1  HCT 42.3 41.1  MCV 87.4 88.8  PLT 270 253   Cardiac Enzymes: No results for input(s): CKTOTAL, CKMB, CKMBINDEX, TROPONINI in the last 168 hours. BNP: Invalid input(s): POCBNP CBG: Recent Labs  Lab 09/18/18 0855 09/18/18 1616 09/18/18 2135 09/19/18 0826 09/19/18 1212  GLUCAP 184* 119* 140* 121* 118*   D-Dimer No results for input(s): DDIMER in the last 72 hours. Hgb A1c No results for input(s): HGBA1C in the last 72 hours. Lipid Profile Recent Labs    09/19/18 0501  CHOL 152  HDL 30*  LDLCALC 101*  TRIG 106  CHOLHDL 5.1   Thyroid function studies No results for input(s): TSH, T4TOTAL, T3FREE, THYROIDAB in the last 72 hours.  Invalid input(s): FREET3 Anemia work up No results for input(s): VITAMINB12, FOLATE, FERRITIN, TIBC, IRON, RETICCTPCT in the last 72 hours. Urinalysis    Component Value Date/Time   COLORURINE YELLOW 09/18/2018 0903   APPEARANCEUR CLEAR 09/18/2018 0903   LABSPEC 1.022 09/18/2018 0903   PHURINE 5.0 09/18/2018 0903   GLUCOSEU >=500 (A) 09/18/2018 0903   HGBUR NEGATIVE 09/18/2018 0903   BILIRUBINUR NEGATIVE 09/18/2018 0903   KETONESUR 20 (A) 09/18/2018 0903   PROTEINUR NEGATIVE 09/18/2018 0903   UROBILINOGEN 1.0 03/25/2008 1430   NITRITE NEGATIVE 09/18/2018 0903    LEUKOCYTESUR NEGATIVE 09/18/2018 0903   Sepsis Labs Invalid input(s): PROCALCITONIN,  WBC,  LACTICIDVEN Microbiology No results found for this or any previous visit (from the past 240 hour(s)).   Time coordinating discharge: 35 minutes  SIGNED:   Rodena Goldmann, DO [facility] 09/19/2018, 12:32 PM Pager [phone]  If 7PM-7AM, please contact night-coverage [email or web address] Password [email or web address]

## 2018-09-25 ENCOUNTER — Ambulatory Visit (INDEPENDENT_AMBULATORY_CARE_PROVIDER_SITE_OTHER): Payer: Medicare HMO | Admitting: Family Medicine

## 2018-09-25 ENCOUNTER — Encounter: Payer: Self-pay | Admitting: Family Medicine

## 2018-09-25 ENCOUNTER — Telehealth: Payer: Self-pay | Admitting: Family Medicine

## 2018-09-25 VITALS — BP 138/80 | Ht 75.0 in | Wt 253.0 lb

## 2018-09-25 DIAGNOSIS — Z23 Encounter for immunization: Secondary | ICD-10-CM | POA: Diagnosis not present

## 2018-09-25 DIAGNOSIS — E1140 Type 2 diabetes mellitus with diabetic neuropathy, unspecified: Secondary | ICD-10-CM | POA: Diagnosis not present

## 2018-09-25 DIAGNOSIS — Z89419 Acquired absence of unspecified great toe: Secondary | ICD-10-CM

## 2018-09-25 DIAGNOSIS — Z794 Long term (current) use of insulin: Secondary | ICD-10-CM

## 2018-09-25 DIAGNOSIS — Z8673 Personal history of transient ischemic attack (TIA), and cerebral infarction without residual deficits: Secondary | ICD-10-CM

## 2018-09-25 DIAGNOSIS — I5189 Other ill-defined heart diseases: Secondary | ICD-10-CM

## 2018-09-25 DIAGNOSIS — D490 Neoplasm of unspecified behavior of digestive system: Secondary | ICD-10-CM

## 2018-09-25 MED ORDER — OXYCODONE-ACETAMINOPHEN 10-325 MG PO TABS: 1.0000 | ORAL_TABLET | ORAL | 0 refills | Status: DC | PRN

## 2018-09-25 MED ORDER — CEPHALEXIN 500 MG PO CAPS: 500.0000 mg | ORAL_CAPSULE | Freq: Four times a day (QID) | ORAL | 0 refills | Status: DC

## 2018-09-25 NOTE — Progress Notes (Signed)
Subjective:    Patient ID: [name], male    DOB: [birth year], 62 y.o.   MRN: [id number]  HPI  Patient is here today to follow up on a stroke he had Sunday September 16, 2018. He was discharged on Dec 4,2019.He says he feels better Q day.He does have a sore on his Right foot that he injured this am, he wants you to look at. Hospital follow-up 25 to 30 minutes spent with patient We did discuss his MRI carotid studies lab work also discussed consultation with neurology and his echo in addition to this patient denies any chest tightness but does relate he gets a little short of breath with activity he also injured his foot earlier today and he did draw a little bit of blood this foot has had previous amputation his diabetes is under fairly good control blood pressure is under good control cholesterol statin was recently increased we will follow this up again in several months.  He is also on pain medicines uses it 3-4 times daily to help control his pain he has backed down on it we will use a reduced amount.  He denies abusing it it does help him function drug profile was reviewed Review of Systems  Constitutional: Negative for diaphoresis and fatigue.  HENT: Negative for congestion and rhinorrhea.   Respiratory: Negative for cough and shortness of breath.   Cardiovascular: Negative for chest pain and leg swelling.  Gastrointestinal: Negative for abdominal pain and diarrhea.  Skin: Negative for color change and rash.  Neurological: Positive for weakness. Negative for dizziness and headaches.  Psychiatric/Behavioral: Negative for behavioral problems and confusion.       Objective:   Physical Exam  Constitutional: He appears well-nourished. No distress.  HENT:  Head: Normocephalic and atraumatic.  Eyes: Right eye exhibits no discharge. Left eye exhibits no discharge.  Neck: No tracheal deviation present.  Cardiovascular: Normal rate, regular rhythm and normal heart sounds.  No murmur  heard. Pulmonary/Chest: Effort normal and breath sounds normal. No respiratory distress.  Musculoskeletal: He exhibits no edema.  Lymphadenopathy:    He has no cervical adenopathy.  Neurological: He is alert. Coordination normal.  Skin: Skin is warm and dry.  Psychiatric: He has a normal mood and affect. His behavior is normal.  Vitals reviewed.  Patient does have weakness in the right arm right leg as well as some difficulty speaking referral has already been made for physical therapy and speech therapy face-to-face evaluation was done today it is in fact necessary for him to receive the services.       Assessment & Plan:  Transitional care  Hyperlipidemia on increased dose of Crestor we will recheck the cholesterol profile in the spring  The patient was seen in followup for chronic pain. A review over at their current pain status was discussed. Drug registry was checked. Prescriptions were given. Discussion was held regarding the importance of compliance with medication as well as pain medication contract.  Time for questions regarding pain management plan occurred. Importance of regular followup visits was discussed. Patient was informed that medication may cause drowsiness and should not be combined  with other medications/alcohol or street drugs. Patient was cautioned that medication could cause drowsiness. If the patient feels medication is causing altered alertness then do not drive or operate dangerous equipment.  Pain medicines electronically sent in  Recent stroke patient will need follow-up with neurology Cardiology And follow-up with ENT  Parotid growth- referral to ENT-MRI shows a  parotid growth he needs referral to ENT not having any pain or ulcers in his mouth  Stroke follow-up with neurology-will need follow-up with Dr.[place] regarding his stroke he will do physical therapy and speech therapy he was warned regarding recurrence of stroke and what to do including  calling 911 or going to ER should it recur  Left ventricular hypokinesis follow-up with cardiology he does have hypokinesis of the left ventricle.  He is at risk for heart disease because of diabetes hyperlipidemia he will need further work-up by cardiology  Patient to follow-up here in 4 to 6 weeks

## 2018-09-25 NOTE — Telephone Encounter (Signed)
Please send in Keflex 500 mg, 1 pill taken 4 times daily for 7 days, #28

## 2018-09-25 NOTE — Telephone Encounter (Signed)
Prescription sent electronically to pharmacy. Patient notified. 

## 2018-09-25 NOTE — Telephone Encounter (Signed)
Wife came by the office saying that patient was seen this morning and was suppose to get an antibiotic sent in for the place on his foot but when they got to the pharmacy nothing was there.  Please advise.  [facility]

## 2018-09-26 NOTE — Progress Notes (Signed)
Referrals ordered in Epic.

## 2018-09-28 ENCOUNTER — Encounter: Payer: Self-pay | Admitting: Family Medicine

## 2018-10-18 ENCOUNTER — Other Ambulatory Visit: Payer: Self-pay | Admitting: Family Medicine

## 2018-10-18 NOTE — Telephone Encounter (Signed)
I called and left a message to ask why he needs an antibiotic.

## 2018-10-18 NOTE — Telephone Encounter (Signed)
Pt is checking on status of refill.

## 2018-10-19 NOTE — Telephone Encounter (Signed)
Nurses it would be fine to go ahead and refill the medicine Very important that the patient has ongoing troubles or worsening issues he needs to be seen

## 2018-10-19 NOTE — Telephone Encounter (Signed)
Pt states he had cut his foot x 3-4 weeks ago, Pt states he had an appt with Dr.Mckinney (foot doctor) and doctor had to reschedule, and pt would like antibiotic until he can get in to see Dr.Mckinney.

## 2018-10-23 DIAGNOSIS — L97512 Non-pressure chronic ulcer of other part of right foot with fat layer exposed: Secondary | ICD-10-CM | POA: Diagnosis not present

## 2018-10-23 DIAGNOSIS — E1142 Type 2 diabetes mellitus with diabetic polyneuropathy: Secondary | ICD-10-CM | POA: Diagnosis not present

## 2018-10-30 DIAGNOSIS — L97512 Non-pressure chronic ulcer of other part of right foot with fat layer exposed: Secondary | ICD-10-CM | POA: Diagnosis not present

## 2018-10-30 DIAGNOSIS — E1142 Type 2 diabetes mellitus with diabetic polyneuropathy: Secondary | ICD-10-CM | POA: Diagnosis not present

## 2018-11-01 ENCOUNTER — Telehealth: Payer: Self-pay | Admitting: Family Medicine

## 2018-11-01 NOTE — Telephone Encounter (Signed)
Wants refill on Percocet sent to [facility], last rx was sent 09-25-18. (notified wife that Dr. Nicki Reaper not here this afternoon)

## 2018-11-01 NOTE — Telephone Encounter (Signed)
Please advise. Thank you

## 2018-11-02 NOTE — Telephone Encounter (Signed)
Wife called back and was informed that it may be tonight or tomorrow; 24 hours business day turnaround. Pt wife verbalized understanding.

## 2018-11-02 NOTE — Telephone Encounter (Signed)
Left message to return call to notify patient that controlled meds will be sent in later tonight or tomorrow. It is a 24 hour business day turn around

## 2018-11-02 NOTE — Telephone Encounter (Signed)
Pt checking on status of refill.

## 2018-11-04 ENCOUNTER — Other Ambulatory Visit: Payer: Self-pay | Admitting: Family Medicine

## 2018-11-04 MED ORDER — OXYCODONE-ACETAMINOPHEN 10-325 MG PO TABS: 1.0000 | ORAL_TABLET | ORAL | 0 refills | Status: DC | PRN

## 2018-11-04 NOTE — Telephone Encounter (Signed)
rx sent in Sunday

## 2018-11-05 ENCOUNTER — Ambulatory Visit (INDEPENDENT_AMBULATORY_CARE_PROVIDER_SITE_OTHER): Payer: Medicare HMO | Admitting: Otolaryngology

## 2018-11-05 DIAGNOSIS — D37030 Neoplasm of uncertain behavior of the parotid salivary glands: Secondary | ICD-10-CM | POA: Diagnosis not present

## 2018-11-05 NOTE — Telephone Encounter (Signed)
Pt notified rx was sent in.

## 2018-11-06 ENCOUNTER — Other Ambulatory Visit (HOSPITAL_COMMUNITY): Payer: Self-pay | Admitting: Otolaryngology

## 2018-11-06 DIAGNOSIS — K118 Other diseases of salivary glands: Secondary | ICD-10-CM

## 2018-11-06 DIAGNOSIS — L97512 Non-pressure chronic ulcer of other part of right foot with fat layer exposed: Secondary | ICD-10-CM | POA: Diagnosis not present

## 2018-11-06 DIAGNOSIS — E1142 Type 2 diabetes mellitus with diabetic polyneuropathy: Secondary | ICD-10-CM | POA: Diagnosis not present

## 2018-11-08 ENCOUNTER — Ambulatory Visit: Payer: Medicare HMO | Admitting: Cardiology

## 2018-11-16 ENCOUNTER — Other Ambulatory Visit: Payer: Self-pay | Admitting: Radiology

## 2018-11-19 ENCOUNTER — Ambulatory Visit (HOSPITAL_COMMUNITY): Admission: RE | Admit: 2018-11-19 | Payer: Medicare HMO | Source: Ambulatory Visit

## 2018-11-20 DIAGNOSIS — L97512 Non-pressure chronic ulcer of other part of right foot with fat layer exposed: Secondary | ICD-10-CM | POA: Diagnosis not present

## 2018-11-20 DIAGNOSIS — E1142 Type 2 diabetes mellitus with diabetic polyneuropathy: Secondary | ICD-10-CM | POA: Diagnosis not present

## 2018-11-27 ENCOUNTER — Encounter: Payer: Self-pay | Admitting: Cardiology

## 2018-11-27 NOTE — Progress Notes (Deleted)
Cardiology Office Note  Date: 11/27/2018   ID: [name], DOB 1956/07/25, MRN [id number]  PCP: Kathyrn Drown, MD  Consulting Cardiologist: Rozann Lesches, MD   No chief complaint on file.   History of Present Illness: [name] is a 63 y.o. male referred for cardiology consultation by Dr. Wolfgang Phoenix for the evaluation of cardiomyopathy.  Patient was hospitalized in December 2019 with stroke, brain MRI demonstrating 18 mm infarct in the left corona radiata.  He was managed on the hospitalist service, treated with aspirin and increased dose statin with outpatient neurology follow-up planned.  As part of his work-up, carotid Dopplers and echocardiogram were also obtained as detailed below.  He is a former patient of Dr. Burt Knack (and previously Dr. Johnsie Cancel) although not seen in the office since 2012.  Cardiac history includes inferior wall infarct in 2008 managed with BMS to the RCA and otherwise no significant obstructive left sided disease.  Echocardiogram from 09/18/2018 reported mild LVH with LVEF 30 to 35%, diffuse hypokinesis, no obvious formed LV mural thrombus with Definity contrast, grade 2 diastolic dysfunction.  Previous echocardiogram from 2009 indicated LVEF 40%.  Past Medical History:  Diagnosis Date  . CAD (coronary artery disease)    BMS to RCA 2008  . Cardiomyopathy ([place])   . Depression   . DJD (degenerative joint disease)   . Essential hypertension   . Gout   . History of inferior wall myocardial infarction 2008  . Hypercholesterolemia   . Hyperlipidemia   . Obesity   . Oral candidiasis   . Peripheral vascular disease ([place])   . Stroke [facility])    December 2019  . Type 2 diabetes mellitus ([place])     Past Surgical History:  Procedure Laterality Date  . AMPUTATION Right 01/12/2015   Procedure: PARTIAL AMPUTATION 1ST RAY RIGHT FOOT (Amputation of right great toe and portion of the first metatarsal bone right foot);  Surgeon: Caprice Beaver, DPM;  [facility];  Service: Podiatry;  Laterality: Right;  . AMPUTATION Right 09/14/2016   Procedure: PARTIAL AMPUTATION RAY 2ND TOE RIGHT FOOT;  Surgeon: Caprice Beaver, DPM;  Location: [facility];  Service: Podiatry;  Laterality: Right;  . COLONOSCOPY  08/2008  . ESOPHAGOGASTRODUODENOSCOPY  08/2008  . LAMINOTOMY  2001, 2006     Right L4-5 interlaminar laminotomy with excision of herniated   disc with operating microscope.  Marland Kitchen NECK SURGERY     cervical disc  . ORIF WRIST FRACTURE Right   . PERIPHERAL VASCULAR CATHETERIZATION N/A 02/27/2015   Procedure: Abdominal Aortogram;  Surgeon: Elam Dutch, MD;  Location: [place] [facility];  Service: Cardiovascular;  Laterality: N/A;  . PERIPHERAL VASCULAR CATHETERIZATION Bilateral 02/27/2015   Procedure: Lower Extremity Angiography;  Surgeon: Elam Dutch, MD;  Location: [place] [facility];  Service: Cardiovascular;  Laterality: Bilateral;  . Right ankle fracture open reduction internal fixation.  2003  . WOUND DEBRIDEMENT Right 12/24/2014   Procedure: DEBRIDEMENT SOFT TISSUE AND BONE RIGHT FOOT;  Surgeon: Caprice Beaver, DPM;  Location: [facility];  Service: Podiatry;  Laterality: Right;    Current Outpatient Medications  Medication Sig Dispense Refill  . aspirin 325 MG tablet Take 325 mg by mouth daily.    . B-D ULTRAFINE III SHORT PEN 31G X 8 MM MISC USE PEN NEEDLES DAILY WITH LANTUS 100 each 5  . dapagliflozin propanediol (FARXIGA) 10 MG TABS tablet Take 10 mg by mouth daily. 90 tablet 3  . Insulin Detemir (LEVEMIR  FLEXTOUCH) 100 UNIT/ML Pen 56 units qhs- MAY TITRATE UP TO 70 UNITS INTO SKIN AT BEDTIME FOR DIABETES (Patient taking differently: Inject 56 Units into the skin at bedtime. 56 units qhs- MAY TITRATE UP TO 70 UNITS INTO SKIN AT BEDTIME FOR DIABETES) 30 mL 5  . Multiple Vitamins-Minerals (MULTIVITAMIN WITH MINERALS) tablet Take 1 tablet by mouth daily.    Marland Kitchen oxyCODONE-acetaminophen (PERCOCET) 10-325 MG tablet Take 1 tablet by mouth every 4  (four) hours as needed for pain. As needed for pain 120 tablet 0  . rosuvastatin (CRESTOR) 10 MG tablet Take 10 mg by mouth at bedtime.     No current facility-administered medications for this visit.    Allergies:  Metformin and related; Statins; and Vicodin [hydrocodone-acetaminophen]   Social History: The patient  reports that he quit smoking about 47 years ago. His smoking use included cigarettes. He has a 10.00 pack-year smoking history. He has never used smokeless tobacco. He reports current drug use. Drug: Marijuana. He reports that he does not drink alcohol.   Family History: The patient's family history includes Cancer in his father, mother, and sister; Diabetes in his father and sister; Heart disease in his sister.   ROS:  Please see the history of present illness. Otherwise, complete review of systems is positive for {NONE DEFAULTED:18576::"none"}.  All other systems are reviewed and negative.   Physical Exam: VS:  There were no vitals taken for this visit., BMI There is no height or weight on file to calculate BMI.  Wt Readings from Last 3 Encounters:  09/25/18 253 lb (114.8 kg)  09/18/18 251 lb 8 oz (114.1 kg)  09/18/18 250 lb (113.4 kg)    General: Patient appears comfortable at rest. HEENT: Conjunctiva and lids normal, oropharynx clear with moist mucosa. Neck: Supple, no elevated JVP or carotid bruits, no thyromegaly. Lungs: Clear to auscultation, nonlabored breathing at rest. Cardiac: Regular rate and rhythm, no S3 or significant systolic murmur, no pericardial rub. Abdomen: Soft, nontender, no hepatomegaly, bowel sounds present, no guarding or rebound. Extremities: No pitting edema, distal pulses 2+. Skin: Warm and dry. Musculoskeletal: No kyphosis. Neuropsychiatric: Alert and oriented x3, affect grossly appropriate.  ECG: I personally reviewed the tracing from 09/18/2018 which showed sinus rhythm with left bundle branch block.  Recent Labwork: 09/18/2018: ALT 21;  AST 23 09/19/2018: BUN 18; Creatinine, Ser 0.90; Hemoglobin 13.1; Platelets 253; Potassium 3.8; Sodium 140     Component Value Date/Time   CHOL 152 09/19/2018 0501   CHOL 131 06/15/2015 0900   TRIG 106 09/19/2018 0501   HDL 30 (L) 09/19/2018 0501   HDL 33 (L) 06/15/2015 0900   CHOLHDL 5.1 09/19/2018 0501   VLDL 21 09/19/2018 0501   LDLCALC 101 (H) 09/19/2018 0501   LDLCALC 84 09/10/2018 0750    Other Studies Reviewed Today:  Echocardiogram 10/05/2018: Study Conclusions  - Left ventricle: The cavity size was normal. Wall thickness was   increased in a pattern of mild LVH. Systolic function was   moderately to severely reduced. The estimated ejection fraction   was in the range of 30% to 35%. Diffuse hypokinesis. There is   akinesis of the basalinferior myocardium. There is swirling and   slow flow at the LV apex with Definity contrast, but no formed   thrombus. Features are consistent with a pseudonormal left   ventricular filling pattern, with concomitant abnormal relaxation   and increased filling pressure (grade 2 diastolic dysfunction). - Aortic valve: Trileaflet; mildly calcified leaflets. - Mitral  valve: Mildly calcified annulus. There was trivial   regurgitation. - Left atrium: The atrium was moderately dilated. - Right atrium: Central venous pressure (est): 3 mm Hg. - Atrial septum: No defect or patent foramen ovale was identified. - Tricuspid valve: There was trivial regurgitation. - Pericardium, extracardiac: A prominent pericardial fat pad was   present.  Carotid Dopplers 10/05/2018: IMPRESSION: Minor carotid atherosclerosis. No hemodynamically significant ICA stenosis. Degree of narrowing less than 50% bilaterally by ultrasound criteria.  Patent antegrade vertebral flow bilaterally  Chest x-ray 09/18/2018: IMPRESSION: Cardiomegaly. No pulmonary venous congestion. No acute pulmonary disease.  Assessment and Plan:   Current medicines were reviewed with  the patient today.  No orders of the defined types were placed in this encounter.   Disposition:  Signed, Satira Sark, MD, [facility] 11/27/2018 11:29 AM    [facility] [place]. [address] [place], [place] [address] Phone: [phone]; Fax: [phone]

## 2018-11-28 ENCOUNTER — Ambulatory Visit: Payer: Medicare HMO | Admitting: Cardiology

## 2018-11-29 ENCOUNTER — Encounter: Payer: Self-pay | Admitting: Cardiology

## 2018-12-04 ENCOUNTER — Other Ambulatory Visit: Payer: Self-pay | Admitting: Family Medicine

## 2018-12-04 ENCOUNTER — Telehealth: Payer: Self-pay | Admitting: Family Medicine

## 2018-12-04 MED ORDER — OXYCODONE-ACETAMINOPHEN 10-325 MG PO TABS: 1.0000 | ORAL_TABLET | ORAL | 0 refills | Status: DC | PRN

## 2018-12-04 NOTE — Telephone Encounter (Signed)
May have this prescription filled This was sent in today He needs to schedule follow-up office visit in approximately 3 weeks regarding pain management and regular health issues

## 2018-12-04 NOTE — Telephone Encounter (Signed)
Requesting refill for: oxyCODONE-acetaminophen (PERCOCET) 10-325 MG tablet   Pharmacy:  [id number] - [place], [place]

## 2018-12-04 NOTE — Telephone Encounter (Signed)
Last seen for chronic pain 09/25/18. Looks like one script sent in that day and pt called in [place] and got refill on 11/04/18. No upcoming appt.

## 2018-12-04 NOTE — Telephone Encounter (Signed)
Left message to return call 

## 2018-12-04 NOTE — Telephone Encounter (Signed)
Pt notified and transferred to the front to schedule office visit

## 2018-12-05 ENCOUNTER — Other Ambulatory Visit: Payer: Self-pay | Admitting: Radiology

## 2018-12-06 ENCOUNTER — Other Ambulatory Visit: Payer: Self-pay | Admitting: Radiology

## 2018-12-07 ENCOUNTER — Ambulatory Visit (HOSPITAL_COMMUNITY): Admission: RE | Admit: 2018-12-07 | Payer: Medicare HMO | Source: Ambulatory Visit

## 2018-12-07 ENCOUNTER — Encounter (HOSPITAL_COMMUNITY): Payer: Self-pay

## 2018-12-11 ENCOUNTER — Ambulatory Visit (INDEPENDENT_AMBULATORY_CARE_PROVIDER_SITE_OTHER): Payer: Medicare HMO | Admitting: Family Medicine

## 2018-12-11 ENCOUNTER — Encounter: Payer: Self-pay | Admitting: Family Medicine

## 2018-12-11 VITALS — BP 126/72 | Temp 99.0°F | Ht 75.0 in | Wt 256.6 lb

## 2018-12-11 DIAGNOSIS — E782 Mixed hyperlipidemia: Secondary | ICD-10-CM | POA: Diagnosis not present

## 2018-12-11 DIAGNOSIS — I70249 Atherosclerosis of native arteries of left leg with ulceration of unspecified site: Secondary | ICD-10-CM | POA: Diagnosis not present

## 2018-12-11 DIAGNOSIS — L97412 Non-pressure chronic ulcer of right heel and midfoot with fat layer exposed: Secondary | ICD-10-CM | POA: Diagnosis not present

## 2018-12-11 DIAGNOSIS — Z79891 Long term (current) use of opiate analgesic: Secondary | ICD-10-CM | POA: Diagnosis not present

## 2018-12-11 DIAGNOSIS — E11621 Type 2 diabetes mellitus with foot ulcer: Secondary | ICD-10-CM | POA: Diagnosis not present

## 2018-12-11 DIAGNOSIS — I70239 Atherosclerosis of native arteries of right leg with ulceration of unspecified site: Secondary | ICD-10-CM

## 2018-12-11 DIAGNOSIS — E1169 Type 2 diabetes mellitus with other specified complication: Secondary | ICD-10-CM

## 2018-12-11 HISTORY — DX: Mixed hyperlipidemia: E11.69

## 2018-12-11 MED ORDER — OXYCODONE-ACETAMINOPHEN 10-325 MG PO TABS: 1.0000 | ORAL_TABLET | ORAL | 0 refills | Status: DC | PRN

## 2018-12-11 MED ORDER — OXYCODONE-ACETAMINOPHEN 10-325 MG PO TABS: 1.0000 | ORAL_TABLET | Freq: Three times a day (TID) | ORAL | 0 refills | Status: DC | PRN

## 2018-12-11 MED ORDER — DOXYCYCLINE HYCLATE 100 MG PO TABS: 100.0000 mg | ORAL_TABLET | Freq: Two times a day (BID) | ORAL | 0 refills | Status: DC

## 2018-12-11 NOTE — Progress Notes (Signed)
Subjective:    Patient ID: [name], male    DOB: January 11, 1956, 63 y.o.   MRN: [id number]  HPI This patient was seen today for chronic pain.  prescribed oxycodone 10/325 mg one every 4 hours prn pain. Takes for leg pain.   The medication list was reviewed and updated.   -Compliance with medication: takes 3 -4 a day mostly 4 a day  - Number patient states they take daily:  3 -4 daily. Mostly 4 a day  -when was the last dose patient took?  This morning about 3 am.   The patient was advised the importance of maintaining medication and not using illegal substances with these.  Here for refills and follow up  The patient was educated that we can provide 3 monthly scripts for their medication, it is their responsibility to follow the instructions.  Side effects or complications from medications: none  Patient is aware that pain medications are meant to minimize the severity of the pain to allow their pain levels to improve to allow for better function. They are aware of that pain medications cannot totally remove their pain.  Due for UDT ( at least once per year) : due today  A1C done in hospital on 09/19/18. Result was 7.2.    Cut on bottom of right foot after a fall over one month ago.   The patient was seen today as part of a comprehensive diabetic check up.the patient does have diabetes.  The patient follows here on a regular basis.  The patient relates medication compliance. No significant side effects to the medications. Denies any low glucose spells. Relates compliance with diet to a reasonable level. Patient does do labwork intermittently and understands the dangers of diabetes.   Patient here for follow-up regarding cholesterol.  The patient does have hyperlipidemia.  Patient does try to maintain a reasonable diet.  Patient does take the medication on a regular basis.  Denies missing a dose.  The patient denies any obvious side effects.  Prior blood work results reviewed  with the patient.  The patient is aware of his cholesterol goals and the need to keep it under good control to lessen the risk of disease.  Patient has diabetic foot ulcer on the right side supposed to see podiatry missed appointment he will try to reset this up we looked at it today.  Patient having a hard time affording his diabetic medicine we talked about various alternatives he will discuss these with his insurance company to see if any of them come in and more affordable price  Review of Systems  Constitutional: Negative for diaphoresis and fatigue.  HENT: Negative for congestion and rhinorrhea.   Respiratory: Negative for cough and shortness of breath.   Cardiovascular: Negative for chest pain and leg swelling.  Gastrointestinal: Negative for abdominal pain and diarrhea.  Musculoskeletal: Positive for arthralgias and back pain.  Skin: Negative for color change and rash.  Neurological: Negative for dizziness and headaches.  Psychiatric/Behavioral: Negative for behavioral problems and confusion.       Objective:   Physical Exam Vitals signs reviewed.  Constitutional:      General: He is not in acute distress. HENT:     Head: Normocephalic and atraumatic.  Eyes:     General:        Right eye: No discharge.        Left eye: No discharge.  Neck:     Trachea: No tracheal deviation.  Cardiovascular:  Rate and Rhythm: Normal rate and regular rhythm.     Heart sounds: Normal heart sounds. No murmur.  Pulmonary:     Effort: Pulmonary effort is normal. No respiratory distress.     Breath sounds: Normal breath sounds.  Lymphadenopathy:     Cervical: No cervical adenopathy.  Skin:    General: Skin is warm and dry.  Neurological:     Mental Status: He is alert.     Coordination: Coordination normal.  Psychiatric:        Behavior: Behavior normal.   He has a diabetic foot ulcer on the right side it is down into the fat layer but has a callus around it he will be seeing  podiatry in the near future  He does have a history of progressive peripheral vascular disease denies any claudication symptoms currently          Assessment & Plan:  Chronic pain The patient was seen in followup for chronic pain. A review over at their current pain status was discussed. Drug registry was checked. Prescriptions were given. Discussion was held regarding the importance of compliance with medication as well as pain medication contract.  Time for questions regarding pain management plan occurred. Importance of regular followup visits was discussed. Patient was informed that medication may cause drowsiness and should not be combined  with other medications/alcohol or street drugs. Patient was cautioned that medication could cause drowsiness. If the patient feels medication is causing altered alertness then do not drive or operate dangerous equipment.  Hyperlipidemia very important for patient to continue medication watch diet previous labs look that new labs will be ordered on next visit labs reviewed with patient  Peripheral vascular disease- patient has diabetes this is complicating this as well as a foot ulcer treatment with antibiotics see podiatry keep diabetes under good control  Patient having a hard time affording his [place] According to medical literature this medicine does not increase her risk of amputation Continue this medication for now but if he cannot afford it we will manage his diabetes with the Levemir only Patient does not tolerate metformin   25 minutes was spent with the patient.  This statement verifies that 25 minutes was indeed spent with the patient.  More than 50% of this visit-total duration of the visit-was spent in counseling and coordination of care. The issues that the patient came in for today as reflected in the diagnosis (s) please refer to documentation for further details.

## 2018-12-13 ENCOUNTER — Other Ambulatory Visit: Payer: Self-pay | Admitting: Radiology

## 2018-12-14 LAB — TOXASSURE SELECT 13 (MW), URINE

## 2018-12-17 ENCOUNTER — Other Ambulatory Visit: Payer: Self-pay

## 2018-12-17 ENCOUNTER — Encounter (HOSPITAL_COMMUNITY): Payer: Self-pay

## 2018-12-17 ENCOUNTER — Ambulatory Visit (HOSPITAL_COMMUNITY)
Admission: RE | Admit: 2018-12-17 | Discharge: 2018-12-17 | Disposition: A | Discharge status: Home / Self Care | Payer: Medicare HMO | Source: Ambulatory Visit | Attending: Otolaryngology | Admitting: Otolaryngology

## 2018-12-17 DIAGNOSIS — I429 Cardiomyopathy, unspecified: Secondary | ICD-10-CM | POA: Diagnosis not present

## 2018-12-17 DIAGNOSIS — Z809 Family history of malignant neoplasm, unspecified: Secondary | ICD-10-CM | POA: Diagnosis not present

## 2018-12-17 DIAGNOSIS — M1990 Unspecified osteoarthritis, unspecified site: Secondary | ICD-10-CM | POA: Insufficient documentation

## 2018-12-17 DIAGNOSIS — E669 Obesity, unspecified: Secondary | ICD-10-CM | POA: Insufficient documentation

## 2018-12-17 DIAGNOSIS — Z7982 Long term (current) use of aspirin: Secondary | ICD-10-CM | POA: Insufficient documentation

## 2018-12-17 DIAGNOSIS — Z87891 Personal history of nicotine dependence: Secondary | ICD-10-CM | POA: Diagnosis not present

## 2018-12-17 DIAGNOSIS — Z79899 Other long term (current) drug therapy: Secondary | ICD-10-CM | POA: Insufficient documentation

## 2018-12-17 DIAGNOSIS — E1169 Type 2 diabetes mellitus with other specified complication: Secondary | ICD-10-CM | POA: Diagnosis not present

## 2018-12-17 DIAGNOSIS — Z833 Family history of diabetes mellitus: Secondary | ICD-10-CM | POA: Diagnosis not present

## 2018-12-17 DIAGNOSIS — Z885 Allergy status to narcotic agent status: Secondary | ICD-10-CM | POA: Diagnosis not present

## 2018-12-17 DIAGNOSIS — Z888 Allergy status to other drugs, medicaments and biological substances status: Secondary | ICD-10-CM | POA: Insufficient documentation

## 2018-12-17 DIAGNOSIS — E782 Mixed hyperlipidemia: Secondary | ICD-10-CM | POA: Diagnosis not present

## 2018-12-17 DIAGNOSIS — Z6832 Body mass index (BMI) 32.0-32.9, adult: Secondary | ICD-10-CM | POA: Insufficient documentation

## 2018-12-17 DIAGNOSIS — E1151 Type 2 diabetes mellitus with diabetic peripheral angiopathy without gangrene: Secondary | ICD-10-CM | POA: Diagnosis not present

## 2018-12-17 DIAGNOSIS — Z882 Allergy status to sulfonamides status: Secondary | ICD-10-CM | POA: Insufficient documentation

## 2018-12-17 DIAGNOSIS — Z794 Long term (current) use of insulin: Secondary | ICD-10-CM | POA: Insufficient documentation

## 2018-12-17 DIAGNOSIS — K119 Disease of salivary gland, unspecified: Secondary | ICD-10-CM | POA: Diagnosis not present

## 2018-12-17 DIAGNOSIS — I2510 Atherosclerotic heart disease of native coronary artery without angina pectoris: Secondary | ICD-10-CM | POA: Insufficient documentation

## 2018-12-17 DIAGNOSIS — I252 Old myocardial infarction: Secondary | ICD-10-CM | POA: Insufficient documentation

## 2018-12-17 DIAGNOSIS — K118 Other diseases of salivary glands: Secondary | ICD-10-CM | POA: Insufficient documentation

## 2018-12-17 DIAGNOSIS — Z89411 Acquired absence of right great toe: Secondary | ICD-10-CM | POA: Diagnosis not present

## 2018-12-17 LAB — CBC WITH DIFFERENTIAL/PLATELET
ABS IMMATURE GRANULOCYTES: 0.03 10*3/uL (ref 0.00–0.07)
Basophils Absolute: 0.1 10*3/uL (ref 0.0–0.1)
Basophils Relative: 1 %
Eosinophils Absolute: 0.2 10*3/uL (ref 0.0–0.5)
Eosinophils Relative: 2 %
HCT: 41.8 % (ref 39.0–52.0)
Hemoglobin: 13.6 g/dL (ref 13.0–17.0)
Immature Granulocytes: 0 %
LYMPHS PCT: 19 %
Lymphs Abs: 1.8 10*3/uL (ref 0.7–4.0)
MCH: 28.2 pg (ref 26.0–34.0)
MCHC: 32.5 g/dL (ref 30.0–36.0)
MCV: 86.7 fL (ref 80.0–100.0)
Monocytes Absolute: 0.8 10*3/uL (ref 0.1–1.0)
Monocytes Relative: 9 %
Neutro Abs: 6.5 10*3/uL (ref 1.7–7.7)
Neutrophils Relative %: 69 %
Platelets: 274 10*3/uL (ref 150–400)
RBC: 4.82 MIL/uL (ref 4.22–5.81)
RDW: 12.2 % (ref 11.5–15.5)
WBC: 9.3 10*3/uL (ref 4.0–10.5)
nRBC: 0 % (ref 0.0–0.2)

## 2018-12-17 LAB — PROTIME-INR
INR: 1 (ref 0.8–1.2)
Prothrombin Time: 12.8 seconds (ref 11.4–15.2)

## 2018-12-17 LAB — GLUCOSE, CAPILLARY: Glucose-Capillary: 135 mg/dL — ABNORMAL HIGH (ref 70–99)

## 2018-12-17 MED ORDER — LIDOCAINE HCL (PF) 1 % IJ SOLN
INTRAMUSCULAR | Status: AC
  Filled 2018-12-17: qty 30

## 2018-12-17 MED ORDER — MIDAZOLAM HCL 2 MG/2ML IJ SOLN
INTRAMUSCULAR | Status: AC
  Filled 2018-12-17: qty 2

## 2018-12-17 MED ORDER — SODIUM CHLORIDE 0.9 % IV SOLN
INTRAVENOUS | Status: DC
  Administered 2018-12-17: 12:00:00 via INTRAVENOUS

## 2018-12-17 MED ORDER — FENTANYL CITRATE (PF) 100 MCG/2ML IJ SOLN
INTRAMUSCULAR | Status: AC
  Administered 2018-12-17: 50 ug
  Filled 2018-12-17: qty 2

## 2018-12-17 NOTE — H&P (Addendum)
Chief Complaint: Patient was seen in consultation today for right parotid gland nodule biopsy at the request of Teoh,Su  Referring Physician(s): Teoh,Su  Supervising Physician: Aletta Edouard  Patient Status: [facility] - Out-[facility]  History of Present Illness: [name] is a 63 y.o. male   Pt suffered CVA 09/2018 Incidental finding on MRI is right parotid gland nodule Hx smoker---quit Denies pain; denies Sore throat Denies enlargement  Scheduled for RIGHT parotid gland nodule biopsy per Dr Benjamine Mola (verified location with Dr Kathlene Cote)    Past Medical History:  Diagnosis Date  . CAD (coronary artery disease)    BMS to RCA 2008  . Cardiomyopathy ([place])   . Depression   . DJD (degenerative joint disease)   . DM type 2 with diabetic mixed hyperlipidemia ([place]) 12/11/2018  . Essential hypertension   . Gout   . History of inferior wall myocardial infarction 2008  . Hypercholesterolemia   . Hyperlipidemia   . Obesity   . Oral candidiasis   . Peripheral vascular disease ([place])   . Stroke [facility])    December 2019  . Type 2 diabetes mellitus ([place])     Past Surgical History:  Procedure Laterality Date  . AMPUTATION Right 01/12/2015   Procedure: PARTIAL AMPUTATION 1ST RAY RIGHT FOOT (Amputation of right great toe and portion of the first metatarsal bone right foot);  Surgeon: Caprice Beaver, DPM;  Location: [facility];  Service: Podiatry;  Laterality: Right;  . AMPUTATION Right 09/14/2016   Procedure: PARTIAL AMPUTATION RAY 2ND TOE RIGHT FOOT;  Surgeon: Caprice Beaver, DPM;  Location: [facility];  Service: Podiatry;  Laterality: Right;  . COLONOSCOPY  08/2008  . ESOPHAGOGASTRODUODENOSCOPY  08/2008  . LAMINOTOMY  2001, 2006     Right L4-5 interlaminar laminotomy with excision of herniated   disc with operating microscope.  Marland Kitchen NECK SURGERY     cervical disc  . ORIF WRIST FRACTURE Right   . PERIPHERAL VASCULAR CATHETERIZATION N/A 02/27/2015   Procedure: Abdominal Aortogram;  Surgeon:  Elam Dutch, MD;  Location: [place] [facility];  Service: Cardiovascular;  Laterality: N/A;  . PERIPHERAL VASCULAR CATHETERIZATION Bilateral 02/27/2015   Procedure: Lower Extremity Angiography;  Surgeon: Elam Dutch, MD;  Location: [place] [facility];  Service: Cardiovascular;  Laterality: Bilateral;  . Right ankle fracture open reduction internal fixation.  2003  . WOUND DEBRIDEMENT Right 12/24/2014   Procedure: DEBRIDEMENT SOFT TISSUE AND BONE RIGHT FOOT;  Surgeon: Caprice Beaver, DPM;  Location: [facility];  Service: Podiatry;  Laterality: Right;    Allergies: Metformin and related; Statins; and Vicodin [hydrocodone-acetaminophen]  Medications: Prior to Admission medications   Medication Sig Start Date End Date Taking? Authorizing Provider  aspirin 325 MG tablet Take 325 mg by mouth every evening.    Yes [provider]  B-D ULTRAFINE III SHORT PEN 31G X 8 MM MISC USE PEN NEEDLES DAILY WITH LANTUS 11/04/15  Yes Luking, Scott A, MD  dapagliflozin propanediol (FARXIGA) 10 MG TABS tablet Take 10 mg by mouth daily. Patient taking differently: Take 10 mg by mouth every evening.  02/26/18  Yes Kathyrn Drown, MD  doxycycline (VIBRA-TABS) 100 MG tablet Take 1 tablet (100 mg total) by mouth 2 (two) times daily. 12/11/18  Yes Luking, Elayne Snare, MD  Insulin Detemir (LEVEMIR FLEXTOUCH) 100 UNIT/ML Pen 56 units qhs- MAY TITRATE UP TO 70 UNITS INTO SKIN AT BEDTIME FOR DIABETES Patient taking differently: Inject 56 Units into the skin at bedtime.  02/26/18  Yes  Kathyrn Drown, MD  Multiple Vitamins-Minerals (MULTIVITAMIN WITH MINERALS) tablet Take 1 tablet by mouth daily.   Yes [provider]  oxyCODONE-acetaminophen (PERCOCET) 10-325 MG tablet Take 1 tablet by mouth every 4 (four) hours as needed for pain. 12/11/18  Yes Kathyrn Drown, MD  rosuvastatin (CRESTOR) 10 MG tablet Take 10 mg by mouth at bedtime.   Yes [provider]     Family History  Problem Relation Age  of Onset  . Cancer Mother   . Cancer Father   . Diabetes Father   . Cancer Sister   . Diabetes Sister   . Heart disease Sister        before age 2    Social History   Socioeconomic History  . Marital status: Married    Spouse name: Not on file  . Number of children: Not on file  . Years of education: Not on file  . Highest education level: Not on file  Occupational History  . Not on file  Social Needs  . Financial resource strain: Not on file  . Food insecurity:    Worry: Not on file    Inability: Not on file  . Transportation needs:    Medical: Not on file    Non-medical: Not on file  Tobacco Use  . Smoking status: Former Smoker    Packs/day: 1.00    Years: 10.00    Pack years: 10.00    Types: Cigarettes    Last attempt to quit: 12/23/1970    Years since quitting: 48.0  . Smokeless tobacco: Never Used  . Tobacco comment: quit in 1979  Substance and Sexual Activity  . Alcohol use: No    Alcohol/week: 0.0 standard drinks  . Drug use: Yes    Types: Marijuana    Comment: in the past  . Sexual activity: Yes    Birth control/protection: None  Lifestyle  . Physical activity:    Days per week: Not on file    Minutes per session: Not on file  . Stress: Not on file  Relationships  . Social connections:    Talks on phone: Not on file    Gets together: Not on file    Attends religious service: Not on file    Active member of club or organization: Not on file    Attends meetings of clubs or organizations: Not on file    Relationship status: Not on file  Other Topics Concern  . Not on file  Social History Narrative    He is married and has 3 kids.  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  He denies any tobacco or alcohol use.     Review of Systems: A 12 point ROS discussed and pertinent positives are indicated in the HPI above.  All other systems are negative.  Review of Systems  Constitutional: Negative for activity change, appetite change, fatigue  and fever.  HENT: Negative for sore throat.   Respiratory: Negative for cough and shortness of breath.   Cardiovascular: Negative for chest pain.  Neurological: Negative for weakness.  Psychiatric/Behavioral: Negative for behavioral problems and confusion.    Vital Signs: BP 132/77   Pulse 67   Temp 98.4 F (36.9 C) (Oral)   Resp 18   Ht 6\' 3"  (1.905 m)   Wt 256 lb 13 oz (116.5 kg)   SpO2 97%   BMI 32.10 kg/m   Physical Exam Vitals signs reviewed.  Cardiovascular:     Rate and Rhythm: Normal rate and regular rhythm.     Heart sounds: Normal heart sounds.  Pulmonary:     Breath sounds: Normal breath sounds.  Abdominal:     General: Bowel sounds are normal.  Musculoskeletal: Normal range of motion.  Skin:    General: Skin is warm and dry.  Neurological:     Mental Status: He is alert and oriented to person, place, and time.  Psychiatric:        Mood and Affect: Mood normal.        Behavior: Behavior normal.        Thought Content: Thought content normal.        Judgment: Judgment normal.     Imaging: No results found.  Labs:  CBC: Recent Labs    09/18/18 0903 09/19/18 0501  WBC 8.9 8.7  HGB 13.7 13.1  HCT 42.3 41.1  PLT 270 253    COAGS: Recent Labs    09/18/18 0903  INR 1.01  APTT 29    BMP: Recent Labs    09/10/18 0750 09/18/18 0903 09/19/18 0501  NA 137 137 140  K 4.6 3.8 3.8  CL 101 103 104  CO2 26 23 27   GLUCOSE 221* 189* 112*  BUN 22 17 18   CALCIUM 9.5 9.2 8.9  CREATININE 0.85 0.96 0.90  GFRNONAA 93 >60 >60  GFRAA 107 >60 >60    LIVER FUNCTION TESTS: Recent Labs    09/10/18 0750 09/18/18 0903  BILITOT 0.5 0.9  AST 16 23  ALT 16 21  ALKPHOS  --  59  PROT 6.8 8.0  ALBUMIN  --  4.6    TUMOR MARKERS: No results for input(s): AFPTM, CEA, CA199, CHROMGRNA in the last 8760 hours.  Assessment and Plan:  Right parotid gland nodule Incidentally found in MRI for CVA 09/2018 Persistent nodule For biopsy today Risks and  benefits of right parotid gland nodule biopsy was discussed with the patient and/or patient's family including, but not limited to bleeding, infection, damage to adjacent structures or low yield requiring additional tests.  All of the questions were answered and there is agreement to proceed. Consent signed and in chart.    Thank you for this interesting consult.  I greatly enjoyed meeting AHMEER TUMAN and look forward to participating in their care.  A copy of this report was sent to the requesting provider on this date.  Electronically Signed: Lavonia Drafts, PA-C 12/17/2018, 12:16 PM   I spent a total of  30 Minutes   in face to face in clinical consultation, greater than 50% of which was counseling/coordinating care for right parotid gland nodule bx

## 2018-12-17 NOTE — Discharge Instructions (Addendum)
Needle Biopsy, Care After °These instructions tell you how to care for yourself after your procedure. Your doctor may also give you more specific instructions. Call your doctor if you have any problems or questions. °What can I expect after the procedure? °After the procedure, it is common to have: °· Soreness. °· Bruising. °· Mild pain. °Follow these instructions at home: ° °· Return to your normal activities as told by your doctor. Ask your doctor what activities are safe for you. °· Take over-the-counter and prescription medicines only as told by your doctor. °· Wash your hands with soap and water before you change your bandage (dressing). If you cannot use soap and water, use hand sanitizer. °· Follow instructions from your doctor about: °? How to take care of your puncture site. °? When and how to change your bandage. °? When to remove your bandage. °· Check your puncture site every day for signs of infection. Watch for: °? Redness, swelling, or pain. °? Fluid or blood.  °? Pus or a bad smell. °? Warmth. °· Do not take baths, swim, or use a hot tub until your doctor approves. Ask your doctor if you may take showers. You may only be allowed to take sponge baths. °· Keep all follow-up visits as told by your doctor. This is important. °Contact a doctor if you have: °· A fever. °· Redness, swelling, or pain at the puncture site, and it lasts longer than a few days. °· Fluid, blood, or pus coming from the puncture site. °· Warmth coming from the puncture site. °Get help right away if: °· You have a lot of bleeding from the puncture site. °Summary °· After the procedure, it is common to have soreness, bruising, or mild pain at the puncture site. °· Check your puncture site every day for signs of infection, such as redness, swelling, or pain. °· Get help right away if you have severe bleeding from your puncture site. °This information is not intended to replace advice given to you by your health care provider. Make  sure you discuss any questions you have with your health care provider. °Document Released: 09/15/2008 Document Revised: 10/16/2017 Document Reviewed: 10/16/2017 °Elsevier Interactive Patient Education © 2019 [employer] Inc[employer] ° °

## 2018-12-17 NOTE — Procedures (Signed)
Interventional Radiology Procedure Note  Procedure: US Guided FNA of right parotid mass  Complications: None  Estimated Blood Loss: < 10 mL  Findings: 22 G core FNAof right parotid mass performed under US guidance.   Venetia Night. Kathlene Cote, M.D Pager:  [phone]

## 2018-12-28 ENCOUNTER — Ambulatory Visit: Payer: Medicare HMO | Admitting: Family Medicine

## 2019-01-03 ENCOUNTER — Telehealth: Payer: Self-pay | Admitting: Family Medicine

## 2019-01-03 NOTE — Telephone Encounter (Signed)
Prescription was sent in 12/11/2018 and pharmacy confirmed they have the prescription ready for pick up. Patient notified and notified understanding.

## 2019-01-03 NOTE — Telephone Encounter (Signed)
Requesting refill  oxyCODONE-acetaminophen (PERCOCET) 10-325 MG tablet   Pharmacy:  [id number] [place] [place]

## 2019-01-04 ENCOUNTER — Telehealth: Payer: Self-pay | Admitting: Family Medicine

## 2019-01-04 MED ORDER — DOXYCYCLINE HYCLATE 100 MG PO TABS: 100.0000 mg | ORAL_TABLET | Freq: Two times a day (BID) | ORAL | 0 refills | Status: DC

## 2019-01-04 NOTE — Telephone Encounter (Signed)
Requesting refill for  doxycycline (VIBRA-TABS) 100 MG tablet   Pharmacy:  [id number] [place], [place]

## 2019-01-04 NOTE — Telephone Encounter (Signed)
Prescription sent electronically to pharmacy. Patient notified. 

## 2019-01-04 NOTE — Telephone Encounter (Signed)
May have a 2-week supply doxycycline twice daily for 2 weeks

## 2019-01-04 NOTE — Telephone Encounter (Signed)
Patient would like a refill on doxycycline for the place on his foot- Patient states he has an appt with the foot doctor in 2 weeks and would like to stay on the medication.

## 2019-01-07 ENCOUNTER — Ambulatory Visit (INDEPENDENT_AMBULATORY_CARE_PROVIDER_SITE_OTHER): Payer: Medicare HMO | Admitting: Otolaryngology

## 2019-01-24 ENCOUNTER — Telehealth: Payer: Self-pay | Admitting: Family Medicine

## 2019-01-24 MED ORDER — DOXYCYCLINE HYCLATE 100 MG PO TABS: 100.0000 mg | ORAL_TABLET | Freq: Two times a day (BID) | ORAL | 0 refills | Status: DC

## 2019-01-24 NOTE — Telephone Encounter (Signed)
Pt returned call and verbalized understanding  

## 2019-01-24 NOTE — Telephone Encounter (Signed)
Patient wanting refill on doxycycline again for foot last filled 3/20 and last seen for foot was 09/25/18. [facility] Please Advise

## 2019-01-24 NOTE — Telephone Encounter (Signed)
Medication sent in. Tried to contact patient but phone kept ringing.

## 2019-01-24 NOTE — Telephone Encounter (Signed)
Foot is still not healed, still has small spot on foot . Pt has appt with foot doctor (Dr.McKinney) on Tuesday. No fever, no drainage, no chills. Please advise. Thank you

## 2019-01-24 NOTE — Telephone Encounter (Signed)
Please refill the doxycycline for him See the foot doctor as planned

## 2019-01-29 DIAGNOSIS — E1142 Type 2 diabetes mellitus with diabetic polyneuropathy: Secondary | ICD-10-CM | POA: Diagnosis not present

## 2019-01-29 DIAGNOSIS — L97512 Non-pressure chronic ulcer of other part of right foot with fat layer exposed: Secondary | ICD-10-CM | POA: Diagnosis not present

## 2019-02-04 ENCOUNTER — Telehealth: Payer: Self-pay | Admitting: Family Medicine

## 2019-02-04 NOTE — Telephone Encounter (Signed)
Pt would like refill on oxyCODONE-acetaminophen (PERCOCET) 10-325 MG tablet. Please send [id number] [place] [place]

## 2019-02-04 NOTE — Telephone Encounter (Signed)
Prescription for April and May are on file per pharmacist. Patient notified and verbalized understanding.

## 2019-02-05 ENCOUNTER — Other Ambulatory Visit: Payer: Self-pay | Admitting: Family Medicine

## 2019-02-05 ENCOUNTER — Telehealth: Payer: Self-pay | Admitting: Family Medicine

## 2019-02-05 MED ORDER — OXYCODONE-ACETAMINOPHEN 10-325 MG PO TABS: ORAL_TABLET | ORAL | 0 refills | Status: DC

## 2019-02-05 MED ORDER — OXYCODONE-ACETAMINOPHEN 10-325 MG PO TABS: 1.0000 | ORAL_TABLET | Freq: Four times a day (QID) | ORAL | 0 refills | Status: DC | PRN

## 2019-02-05 NOTE — Telephone Encounter (Signed)
Left message to return call 

## 2019-02-05 NOTE — Telephone Encounter (Signed)
Patient said that when they picked up the pain meds from yesterday that it was only for #20, and not 120. I told patient I see quantity of 120 in the chart. He wants [place] to call the pharmacy to straighten it all out.

## 2019-02-05 NOTE — Telephone Encounter (Signed)
This was corrected and sent in today

## 2019-02-05 NOTE — Telephone Encounter (Signed)
Pharmacist states 3 scripts for pain medication were sent in for patient in late February. Patient states the one for march was #120 but the ones for April and May were for #20  Patient would like this corrected please  [facility] [place]

## 2019-02-06 NOTE — Telephone Encounter (Signed)
Patient notified

## 2019-02-12 DIAGNOSIS — L97512 Non-pressure chronic ulcer of other part of right foot with fat layer exposed: Secondary | ICD-10-CM | POA: Diagnosis not present

## 2019-02-12 DIAGNOSIS — E1142 Type 2 diabetes mellitus with diabetic polyneuropathy: Secondary | ICD-10-CM | POA: Diagnosis not present

## 2019-03-04 ENCOUNTER — Other Ambulatory Visit: Payer: Self-pay | Admitting: Family Medicine

## 2019-03-19 ENCOUNTER — Ambulatory Visit: Payer: Medicare HMO | Admitting: Family Medicine

## 2019-03-19 ENCOUNTER — Ambulatory Visit (INDEPENDENT_AMBULATORY_CARE_PROVIDER_SITE_OTHER): Payer: Medicare HMO | Admitting: Family Medicine

## 2019-03-19 ENCOUNTER — Other Ambulatory Visit: Payer: Self-pay

## 2019-03-19 DIAGNOSIS — G894 Chronic pain syndrome: Secondary | ICD-10-CM

## 2019-03-19 DIAGNOSIS — L97512 Non-pressure chronic ulcer of other part of right foot with fat layer exposed: Secondary | ICD-10-CM | POA: Diagnosis not present

## 2019-03-19 DIAGNOSIS — Z79899 Other long term (current) drug therapy: Secondary | ICD-10-CM | POA: Diagnosis not present

## 2019-03-19 DIAGNOSIS — E782 Mixed hyperlipidemia: Secondary | ICD-10-CM

## 2019-03-19 DIAGNOSIS — E1140 Type 2 diabetes mellitus with diabetic neuropathy, unspecified: Secondary | ICD-10-CM | POA: Diagnosis not present

## 2019-03-19 DIAGNOSIS — I739 Peripheral vascular disease, unspecified: Secondary | ICD-10-CM

## 2019-03-19 DIAGNOSIS — Z794 Long term (current) use of insulin: Secondary | ICD-10-CM

## 2019-03-19 DIAGNOSIS — E1142 Type 2 diabetes mellitus with diabetic polyneuropathy: Secondary | ICD-10-CM | POA: Diagnosis not present

## 2019-03-19 DIAGNOSIS — M170 Bilateral primary osteoarthritis of knee: Secondary | ICD-10-CM

## 2019-03-19 MED ORDER — OXYCODONE-ACETAMINOPHEN 10-325 MG PO TABS: 1.0000 | ORAL_TABLET | Freq: Four times a day (QID) | ORAL | 0 refills | Status: DC | PRN

## 2019-03-19 MED ORDER — OXYCODONE-ACETAMINOPHEN 10-325 MG PO TABS: ORAL_TABLET | ORAL | 0 refills | Status: DC

## 2019-03-19 NOTE — Progress Notes (Signed)
Subjective:    Patient ID: [name], male    DOB: 11-10-1955, 63 y.o.   MRN: [id number] Telephone only patient did not have video available to him HPI This patient was seen today for chronic pain Virtual Visit via Video Note  I connected with [name] on 03/19/19 at  8:50 AM EDT by a video enabled telemedicine application and verified that I am speaking with the correct person using two identifiers.  Location: Patient: Home Provider: Office   I discussed the limitations of evaluation and management by telemedicine and the availability of in person appointments. The patient expressed understanding and agreed to proceed.  History of Present Illness:    Observations/Objective:   Assessment and Plan:   Follow Up Instructions:    I discussed the assessment and treatment plan with the patient. The patient was provided an opportunity to ask questions and all were answered. The patient agreed with the plan and demonstrated an understanding of the instructions.   The patient was advised to call back or seek an in-person evaluation if the symptoms worsen or if the condition fails to improve as anticipated.  I provided 15 minutes of non-face-to-face time during this encounter.   Sallee Lange, MD   The medication list was reviewed and updated.   -Compliance with medication: takes 4 -5 per day just to sleep. Legs and back hurt  - Number patient states they take daily: 4 -5 per  day  -when was the last dose patient took? This morning  The patient was advised the importance of maintaining medication and not using illegal substances with these.  Here for refills and follow up  The patient was educated that we can provide 3 monthly scripts for their medication, it is their responsibility to follow the instructions.  Side effects or complications from medications: none  Patient is aware that pain medications are meant to minimize the severity of the pain to allow  their pain levels to improve to allow for better function. They are aware of that pain medications cannot totally remove their pain.  Due for UDT ( at least once per year) : last one was 11/2018  [place] trailer about 2 weeks ago. Still having mid back pain.       Review of Systems  Constitutional: Negative for diaphoresis and fatigue.  HENT: Negative for congestion and rhinorrhea.   Respiratory: Negative for cough and shortness of breath.   Cardiovascular: Negative for chest pain and leg swelling.  Gastrointestinal: Negative for abdominal pain and diarrhea.  Skin: Negative for color change and rash.  Neurological: Negative for dizziness and headaches.  Psychiatric/Behavioral: Negative for behavioral problems and confusion.       Objective:   Physical Exam Vitals signs reviewed.  Constitutional:      General: He is not in acute distress. HENT:     Head: Normocephalic and atraumatic.  Eyes:     General:        Right eye: No discharge.        Left eye: No discharge.  Neck:     Trachea: No tracheal deviation.  Cardiovascular:     Rate and Rhythm: Normal rate and regular rhythm.     Heart sounds: Normal heart sounds. No murmur.  Pulmonary:     Effort: Pulmonary effort is normal. No respiratory distress.     Breath sounds: Normal breath sounds.  Lymphadenopathy:     Cervical: No cervical adenopathy.  Skin:    General:  Skin is warm and dry.  Neurological:     Mental Status: He is alert.     Coordination: Coordination normal.  Psychiatric:        Behavior: Behavior normal.           Assessment & Plan:  Diabetes patient will do lab work will follow-up accordingly follow-up in 3 months  Hyperlipidemia previous labs reviewed new labs ordered continue current medication  The patient was seen in followup for chronic pain. A review over at their current pain status was discussed. Drug registry was checked. Prescriptions were given. Discussion was held regarding the  importance of compliance with medication as well as pain medication contract.  Time for questions regarding pain management plan occurred. Importance of regular followup visits was discussed. Patient was informed that medication may cause drowsiness and should not be combined  with other medications/alcohol or street drugs. Patient was cautioned that medication could cause drowsiness. If the patient feels medication is causing altered alertness then do not drive or operate dangerous equipment.  Continue pain medication 3 prescription sent in  History  For peripheral vascular disease will be seeing foot doctor coming up  Office visit here in 3 months in person

## 2019-05-08 ENCOUNTER — Telehealth: Payer: Self-pay | Admitting: Family Medicine

## 2019-05-08 MED ORDER — CEPHALEXIN 500 MG PO CAPS: ORAL_CAPSULE | ORAL | 0 refills | Status: DC

## 2019-05-08 NOTE — Telephone Encounter (Signed)
Very important to see the podiatrist May have prescription for Keflex 500 mg 1 4 times daily for 7 days If any significant infection very important to be seen by podiatry or by [place] immediately

## 2019-05-08 NOTE — Telephone Encounter (Signed)
Pt states he has a big corn in middle of foot. Getting bigger and painful. Been there a while. Right before christmas. No swelling in feet, no fever, no shortness of breath, no trouble breathing. Pt is going to call Dr.McKinney to see if he can get an appt. Please advise. Thank you

## 2019-05-08 NOTE — Telephone Encounter (Signed)
Pt has a place on the bottom of his foot and wants an antibiotic called in. He doesn't want it to get infected. States he is going to call Dr. Caprice Beaver today to schedule a visit with him as soon as he can.   [id number] [place] [place]

## 2019-05-08 NOTE — Telephone Encounter (Signed)
Left message to return call 

## 2019-05-08 NOTE — Telephone Encounter (Signed)
Medication sent to pharmacy. Left message to return call 

## 2019-05-09 ENCOUNTER — Other Ambulatory Visit: Payer: Self-pay | Admitting: Family Medicine

## 2019-05-09 NOTE — Telephone Encounter (Signed)
I approved his medicine thank you was sent to the pharmacy thank you

## 2019-05-09 NOTE — Telephone Encounter (Signed)
Pt returned call and verbalized understanding. Pt states he needs his Crestor refilled. Pt states the pharmacist said another provider filled it last and was waiting on them to respond back, pt states he has been on Crestor for a while and Dr.Scott has prescribed. Please advise. Thank you

## 2019-05-10 NOTE — Telephone Encounter (Signed)
Left message to return call 

## 2019-05-13 NOTE — Telephone Encounter (Signed)
Pt.notified

## 2019-05-18 ENCOUNTER — Other Ambulatory Visit: Payer: Self-pay | Admitting: Family Medicine

## 2019-05-18 MED ORDER — LEVEMIR FLEXTOUCH 100 UNIT/ML ~~LOC~~ SOPN: PEN_INJECTOR | SUBCUTANEOUS | 5 refills | Status: DC

## 2019-05-28 DIAGNOSIS — I739 Peripheral vascular disease, unspecified: Secondary | ICD-10-CM | POA: Diagnosis not present

## 2019-05-28 DIAGNOSIS — E1142 Type 2 diabetes mellitus with diabetic polyneuropathy: Secondary | ICD-10-CM | POA: Diagnosis not present

## 2019-05-28 DIAGNOSIS — B351 Tinea unguium: Secondary | ICD-10-CM | POA: Diagnosis not present

## 2019-05-28 DIAGNOSIS — L97512 Non-pressure chronic ulcer of other part of right foot with fat layer exposed: Secondary | ICD-10-CM | POA: Diagnosis not present

## 2019-05-28 DIAGNOSIS — L851 Acquired keratosis [keratoderma] palmaris et plantaris: Secondary | ICD-10-CM | POA: Diagnosis not present

## 2019-06-11 DIAGNOSIS — I739 Peripheral vascular disease, unspecified: Secondary | ICD-10-CM | POA: Diagnosis not present

## 2019-06-11 DIAGNOSIS — L97512 Non-pressure chronic ulcer of other part of right foot with fat layer exposed: Secondary | ICD-10-CM | POA: Diagnosis not present

## 2019-06-11 DIAGNOSIS — E1142 Type 2 diabetes mellitus with diabetic polyneuropathy: Secondary | ICD-10-CM | POA: Diagnosis not present

## 2019-07-16 ENCOUNTER — Ambulatory Visit (INDEPENDENT_AMBULATORY_CARE_PROVIDER_SITE_OTHER): Payer: Medicare HMO | Admitting: Family Medicine

## 2019-07-16 DIAGNOSIS — Z794 Long term (current) use of insulin: Secondary | ICD-10-CM

## 2019-07-16 DIAGNOSIS — Z79899 Other long term (current) drug therapy: Secondary | ICD-10-CM | POA: Diagnosis not present

## 2019-07-16 DIAGNOSIS — Z125 Encounter for screening for malignant neoplasm of prostate: Secondary | ICD-10-CM

## 2019-07-16 DIAGNOSIS — E1140 Type 2 diabetes mellitus with diabetic neuropathy, unspecified: Secondary | ICD-10-CM

## 2019-07-16 DIAGNOSIS — E1142 Type 2 diabetes mellitus with diabetic polyneuropathy: Secondary | ICD-10-CM

## 2019-07-16 DIAGNOSIS — E782 Mixed hyperlipidemia: Secondary | ICD-10-CM

## 2019-07-16 DIAGNOSIS — E1169 Type 2 diabetes mellitus with other specified complication: Secondary | ICD-10-CM

## 2019-07-16 DIAGNOSIS — M170 Bilateral primary osteoarthritis of knee: Secondary | ICD-10-CM

## 2019-07-16 MED ORDER — OXYCODONE-ACETAMINOPHEN 10-325 MG PO TABS: ORAL_TABLET | ORAL | 0 refills | Status: DC

## 2019-07-16 MED ORDER — FARXIGA 10 MG PO TABS: 10.0000 mg | ORAL_TABLET | Freq: Every day | ORAL | 1 refills | Status: DC

## 2019-07-16 MED ORDER — ROSUVASTATIN CALCIUM 10 MG PO TABS: ORAL_TABLET | ORAL | 5 refills | Status: DC

## 2019-07-16 MED ORDER — OXYCODONE-ACETAMINOPHEN 10-325 MG PO TABS: 1.0000 | ORAL_TABLET | Freq: Four times a day (QID) | ORAL | 0 refills | Status: DC | PRN

## 2019-07-16 NOTE — Progress Notes (Signed)
Subjective:    Patient ID: [name], male    DOB: 1955-11-07, 63 y.o.   MRN: [id number]  HPI This patient was seen today for chronic pain  The medication list was reviewed and updated.   -Compliance with medication: Oxycodone 10-325 mg  - Number patient states they take daily: 3-4  -when was the last dose patient took? This morning  The patient was advised the importance of maintaining medication and not using illegal substances with these.  Here for refills and follow up  The patient was educated that we can provide 3 monthly scripts for their medication, it is their responsibility to follow the instructions.  Side effects or complications from medications: none  Patient is aware that pain medications are meant to minimize the severity of the pain to allow their pain levels to improve to allow for better function. They are aware of that pain medications cannot totally remove their pain.  Due for UDT ( at least once per year) : Will be done next in person visit  Diabetes he states his sugars are doing fairly well he does try to watch his diet he is trying to stay active he does take his medicine denies any low sugar spells  Peripheral vascular disease associated with diabetes he does see the foot doctor on a regular basis denies any foot infection or foot ulcers currently  Hyperlipidemia associated with diabetes currently takes his medicine watches diet stays physically active is willing to do more lab work previous labs reviewed    Virtual Visit via Video Note  I connected with [name] on 07/16/19 at  9:30 AM EDT by a video enabled telemedicine application and verified that I am speaking with the correct person using two identifiers.  Location: Patient: home Provider: office   I discussed the limitations of evaluation and management by telemedicine and the availability of in person appointments. The patient expressed understanding and agreed to proceed.   History of Present Illness:    Observations/Objective:   Assessment and Plan:   Follow Up Instructions:    I discussed the assessment and treatment plan with the patient. The patient was provided an opportunity to ask questions and all were answered. The patient agreed with the plan and demonstrated an understanding of the instructions.   The patient was advised to call back or seek an in-person evaluation if the symptoms worsen or if the condition fails to improve as anticipated.  I provided 25 minutes of non-face-to-face time during this encounter.   Vicente Males, LPN        Review of Systems  Constitutional: Negative for diaphoresis and fatigue.  HENT: Negative for congestion and rhinorrhea.   Respiratory: Negative for cough and shortness of breath.   Cardiovascular: Negative for chest pain and leg swelling.  Gastrointestinal: Negative for abdominal pain and diarrhea.  Skin: Negative for color change and rash.  Neurological: Negative for dizziness and headaches.  Psychiatric/Behavioral: Negative for behavioral problems and confusion.       Objective:   Physical Exam Vitals signs reviewed.  Constitutional:      General: He is not in acute distress. HENT:     Head: Normocephalic and atraumatic.  Eyes:     General:        Right eye: No discharge.        Left eye: No discharge.  Neck:     Trachea: No tracheal deviation.  Cardiovascular:     Rate and Rhythm: Normal  rate and regular rhythm.     Heart sounds: Normal heart sounds. No murmur.  Pulmonary:     Effort: Pulmonary effort is normal. No respiratory distress.     Breath sounds: Normal breath sounds.  Lymphadenopathy:     Cervical: No cervical adenopathy.  Skin:    General: Skin is warm and dry.  Neurological:     Mental Status: He is alert.     Coordination: Coordination normal.  Psychiatric:        Behavior: Behavior normal.           Assessment & Plan:  1. Type 2 diabetes mellitus with  diabetic neuropathy, with long-term current use of insulin (HCC) Hopefully his diabetes under good control check A1c previous labs reviewed - Lipid Profile - Hepatic function panel - Basic Metabolic Panel (BMET) - Hemoglobin A1c - Urine Microalbumin w/creat. ratio - PSA  2. Diabetic peripheral neuropathy (HCC) Pain medicine does help him with this.  Keeping diabetes under control helps - Lipid Profile - Hepatic function panel - Basic Metabolic Panel (BMET) - Hemoglobin A1c - Urine Microalbumin w/creat. ratio - PSA  3. Mixed hyperlipidemia Certainly check cholesterol continue medication - Lipid Profile  4. Screening PSA (prostate specific antigen) He is due for PSA screening - PSA  5. High risk medication use Check liver function - Hepatic function panel  6. DM type 2 with diabetic mixed hyperlipidemia ([place]) Hopefully diabetes under decent control check lab work  7. Primary osteoarthritis of both knees Osteoarthritis has chronic pain with his knees and his back and with his neck  The patient was seen in followup for chronic pain. A review over at their current pain status was discussed. Drug registry was checked. Prescriptions were given. Discussion was held regarding the importance of compliance with medication as well as pain medication contract.  Time for questions regarding pain management plan occurred. Importance of regular followup visits was discussed. Patient was informed that medication may cause drowsiness and should not be combined  with other medications/alcohol or street drugs. Patient was cautioned that medication could cause drowsiness. If the patient feels medication is causing altered alertness then do not drive or operate dangerous equipment.

## 2019-07-20 ENCOUNTER — Other Ambulatory Visit (INDEPENDENT_AMBULATORY_CARE_PROVIDER_SITE_OTHER): Payer: Medicare HMO | Admitting: *Deleted

## 2019-07-20 DIAGNOSIS — Z23 Encounter for immunization: Secondary | ICD-10-CM

## 2019-07-31 ENCOUNTER — Telehealth: Payer: Self-pay | Admitting: Family Medicine

## 2019-07-31 MED ORDER — CEPHALEXIN 500 MG PO CAPS: ORAL_CAPSULE | ORAL | 0 refills | Status: DC

## 2019-07-31 NOTE — Telephone Encounter (Signed)
Please advise. Thank you

## 2019-07-31 NOTE — Telephone Encounter (Signed)
Infected corn on foot that he wants an antibiotic to be called in for.  Said Dr. Nicki Reaper knows all about his problems with his feet.  He has an appt next week with a foot doctor but is afraid it will get infected before he can be seen and doesn't want to loose any more toes.  [facility] 

## 2019-07-31 NOTE — Telephone Encounter (Signed)
Antibiotic sent in and pt is notified

## 2019-07-31 NOTE — Telephone Encounter (Signed)
Keflex 500 mg 1 4 times daily for 7 days

## 2019-08-06 DIAGNOSIS — E1142 Type 2 diabetes mellitus with diabetic polyneuropathy: Secondary | ICD-10-CM | POA: Diagnosis not present

## 2019-08-06 DIAGNOSIS — B351 Tinea unguium: Secondary | ICD-10-CM | POA: Diagnosis not present

## 2019-08-06 DIAGNOSIS — L851 Acquired keratosis [keratoderma] palmaris et plantaris: Secondary | ICD-10-CM | POA: Diagnosis not present

## 2019-08-22 ENCOUNTER — Other Ambulatory Visit: Payer: Self-pay | Admitting: Family Medicine

## 2019-08-22 ENCOUNTER — Telehealth: Payer: Self-pay | Admitting: Family Medicine

## 2019-08-22 MED ORDER — OXYCODONE-ACETAMINOPHEN 10-325 MG PO TABS: ORAL_TABLET | ORAL | 0 refills | Status: DC

## 2019-08-22 NOTE — Telephone Encounter (Signed)
Please advise. Thank you

## 2019-08-22 NOTE — Telephone Encounter (Signed)
Pt contacted and verbalized understanding.  

## 2019-08-22 NOTE — Telephone Encounter (Signed)
Patient is out of his pain meds. He has refills and has been waiting because [facility] [place] said they were getting some yesterday and now they are saying it won't be until next week sometime.  He said he hates to call and have to change it but he doesn't know what else to do.  He has called [facility] and they have it but will not fill any new prescriptions.  He said the only place he can find it [facility] [place], [place].  He apologized again for waiting until the last minute but [facility] kept telling him they would be getting it in. oxyCODONE-acetaminophen (PERCOCET) 10-325 MG

## 2019-08-22 NOTE — Telephone Encounter (Signed)
Prescription was sent as requested

## 2019-09-11 ENCOUNTER — Telehealth: Payer: Self-pay | Admitting: Family Medicine

## 2019-09-11 NOTE — Telephone Encounter (Signed)
Patient calling for a refill on oxyCODONE-acetaminophen (PERCOCET) 10-325 MG    [facility] [place] [place]

## 2019-09-11 NOTE — Telephone Encounter (Signed)
Left message to return call to discuss with pt. I called pharm and he has a script on file that is due to be filled on 09/19/19 since he last filled 08/20/19.

## 2019-09-13 NOTE — Telephone Encounter (Signed)
Left message to return call 

## 2019-09-16 NOTE — Telephone Encounter (Signed)
Pt.notified

## 2019-09-17 ENCOUNTER — Other Ambulatory Visit: Payer: Self-pay | Admitting: *Deleted

## 2019-09-17 MED ORDER — OXYCODONE-ACETAMINOPHEN 10-325 MG PO TABS: 1.0000 | ORAL_TABLET | Freq: Four times a day (QID) | ORAL | 0 refills | Status: DC | PRN

## 2019-09-17 NOTE — Telephone Encounter (Signed)
Pt states his pharm wont have oxycodone when it is due again on 12/5. Pt would like [facility] [place] [address]. He states he called and they will have it on the 5th for him. He would like a call back after its been sent. I have pended the med so its ready to sign if you agree.

## 2019-10-09 ENCOUNTER — Other Ambulatory Visit: Payer: Self-pay | Admitting: *Deleted

## 2019-10-09 ENCOUNTER — Telehealth: Payer: Self-pay | Admitting: Family Medicine

## 2019-10-09 MED ORDER — HALOBETASOL PROPIONATE 0.05 % EX OINT: TOPICAL_OINTMENT | Freq: Two times a day (BID) | CUTANEOUS | 1 refills | Status: DC

## 2019-10-09 MED ORDER — CEPHALEXIN 500 MG PO CAPS: 500.0000 mg | ORAL_CAPSULE | Freq: Four times a day (QID) | ORAL | 0 refills | Status: DC

## 2019-10-09 NOTE — Telephone Encounter (Signed)
Patient states that his feet/hands are dry and cracking open and wanting some cream called into [facility]

## 2019-10-09 NOTE — Telephone Encounter (Signed)
Meds sent to pharm. Left message to return call

## 2019-10-09 NOTE — Telephone Encounter (Signed)
Pt.notified

## 2019-10-09 NOTE — Telephone Encounter (Signed)
Ultravate ointment applied twice daily as needed 45 g to with 1 refill Keflex 500 mg 4 times daily for 7 days Follow-up if any ongoing troubles

## 2019-10-09 NOTE — Telephone Encounter (Signed)
Has been using silverdene cream on hands and feet that dr Caprice Beaver gave him. 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 States hands are so sore he can hardly close them.  Has appt with Dr Phyllis Ginger to check calcus on his foot that he thinks is infected. Wonders if dr Nicki Reaper will call in antibiotic. Pt wants to get ultravate ointment not the cream.   [facility] [place].

## 2019-10-15 DIAGNOSIS — L851 Acquired keratosis [keratoderma] palmaris et plantaris: Secondary | ICD-10-CM | POA: Diagnosis not present

## 2019-10-15 DIAGNOSIS — B351 Tinea unguium: Secondary | ICD-10-CM | POA: Diagnosis not present

## 2019-10-15 DIAGNOSIS — E1142 Type 2 diabetes mellitus with diabetic polyneuropathy: Secondary | ICD-10-CM | POA: Diagnosis not present

## 2019-10-15 DIAGNOSIS — I739 Peripheral vascular disease, unspecified: Secondary | ICD-10-CM | POA: Diagnosis not present

## 2019-10-21 ENCOUNTER — Ambulatory Visit (INDEPENDENT_AMBULATORY_CARE_PROVIDER_SITE_OTHER): Payer: Medicare HMO | Admitting: Family Medicine

## 2019-10-21 ENCOUNTER — Other Ambulatory Visit: Payer: Self-pay

## 2019-10-21 VITALS — Temp 97.1°F

## 2019-10-21 DIAGNOSIS — R600 Localized edema: Secondary | ICD-10-CM | POA: Diagnosis not present

## 2019-10-21 DIAGNOSIS — L2089 Other atopic dermatitis: Secondary | ICD-10-CM

## 2019-10-21 MED ORDER — POTASSIUM CHLORIDE CRYS ER 20 MEQ PO TBCR: 20.0000 meq | EXTENDED_RELEASE_TABLET | Freq: Every day | ORAL | 1 refills | Status: DC

## 2019-10-21 MED ORDER — FUROSEMIDE 20 MG PO TABS: 20.0000 mg | ORAL_TABLET | Freq: Every day | ORAL | 1 refills | Status: DC | PRN

## 2019-10-21 MED ORDER — BETAMETHASONE VALERATE 0.1 % EX OINT: 1.0000 "application " | TOPICAL_OINTMENT | Freq: Two times a day (BID) | CUTANEOUS | 0 refills | Status: DC | PRN

## 2019-10-21 NOTE — Progress Notes (Signed)
   Subjective:    Patient ID: [name], male    DOB: [birth year], 64 y.o.   MRN: [id number]  HPI  Patient arrives with problem of hands and feet cracking open. Patient has a history of psoriasis and tried his Ultravate cream but it has not helped and seemed to make it worse. Patient with significant rash on his hands and his feet itching a lot some blistering some cracking and crusting.  PMH patient also has diabetes needs to do his lab work states he is taking his medication Chronic pain he sees [place] every 3 months for his pain medication has been under good control lately. Review of Systems  Constitutional: Negative for activity change.  HENT: Negative for congestion and rhinorrhea.   Respiratory: Negative for cough and shortness of breath.   Cardiovascular: Negative for chest pain.  Gastrointestinal: Negative for abdominal pain, diarrhea, nausea and vomiting.  Genitourinary: Negative for dysuria and hematuria.  Musculoskeletal: Positive for arthralgias and back pain.  Skin: Positive for rash. Negative for color change and pallor.  Neurological: Negative for weakness and headaches.  Psychiatric/Behavioral: Negative for behavioral problems and confusion.       Objective:   Physical Exam Vitals reviewed.  Constitutional:      General: He is not in acute distress. HENT:     Head: Normocephalic and atraumatic.  Eyes:     General:        Right eye: No discharge.        Left eye: No discharge.  Neck:     Trachea: No tracheal deviation.  Cardiovascular:     Rate and Rhythm: Normal rate and regular rhythm.     Heart sounds: Normal heart sounds. No murmur.  Pulmonary:     Effort: Pulmonary effort is normal. No respiratory distress.     Breath sounds: Normal breath sounds.  Lymphadenopathy:     Cervical: No cervical adenopathy.  Skin:    General: Skin is warm and dry.  Neurological:     Mental Status: He is alert.     Coordination: Coordination normal.  Psychiatric:      Behavior: Behavior normal.    Pedal edema is noted worse on the right than the left severe cracking in the hands and feet noted      Assessment & Plan:  Change steroid cream to betamethasone Referral to dermatology Diabetes check lab work patient has not done his lab work recently he will check this Pedal edema recommend Lasix and potassium await results of lab work

## 2019-10-22 ENCOUNTER — Encounter: Payer: Self-pay | Admitting: Family Medicine

## 2019-10-22 ENCOUNTER — Telehealth: Payer: Self-pay | Admitting: Family Medicine

## 2019-10-22 ENCOUNTER — Other Ambulatory Visit: Payer: Self-pay | Admitting: Family Medicine

## 2019-10-22 MED ORDER — OXYCODONE-ACETAMINOPHEN 10-325 MG PO TABS: ORAL_TABLET | ORAL | 0 refills | Status: DC

## 2019-10-22 NOTE — Telephone Encounter (Signed)
Pt wanted to let Dr. Nicki Reaper know that the ointment worked well, used it last night & can already tell a difference  Appt with derm is 10/29/2019 & pt is aware

## 2019-10-22 NOTE — Telephone Encounter (Signed)
[facility] on the database patient may get it filled tomorrow morning He can call them later today to ask them to have it ready

## 2019-10-22 NOTE — Telephone Encounter (Signed)
FYI

## 2019-10-22 NOTE — Telephone Encounter (Signed)
Last pain management appt 07/16/2019. Has upcoming appt on 11/11/2019. Please advise. Thank you

## 2019-10-22 NOTE — Telephone Encounter (Signed)
Attempted to contact patient; no voicemail set up. Unable to leave message

## 2019-10-22 NOTE — Telephone Encounter (Signed)
Pt returned call. Pt states he called the office back and informed us that [facility] already [facility]. I do not see any documentation of that message. Pt informed to keep upcoming appt.

## 2019-10-22 NOTE — Telephone Encounter (Signed)
Patient is requesting refill on oxycodone 10/325 hasnt had pain management  follow up visit since spring of 2020. He would like medication switched to [facility]. He states they have medication now.Please advise

## 2019-10-28 DIAGNOSIS — Z79899 Other long term (current) drug therapy: Secondary | ICD-10-CM | POA: Diagnosis not present

## 2019-10-28 DIAGNOSIS — E1140 Type 2 diabetes mellitus with diabetic neuropathy, unspecified: Secondary | ICD-10-CM | POA: Diagnosis not present

## 2019-10-28 DIAGNOSIS — Z125 Encounter for screening for malignant neoplasm of prostate: Secondary | ICD-10-CM | POA: Diagnosis not present

## 2019-10-28 DIAGNOSIS — E1142 Type 2 diabetes mellitus with diabetic polyneuropathy: Secondary | ICD-10-CM | POA: Diagnosis not present

## 2019-10-28 DIAGNOSIS — E782 Mixed hyperlipidemia: Secondary | ICD-10-CM | POA: Diagnosis not present

## 2019-10-28 DIAGNOSIS — Z794 Long term (current) use of insulin: Secondary | ICD-10-CM | POA: Diagnosis not present

## 2019-10-29 ENCOUNTER — Telehealth: Payer: Self-pay | Admitting: Family Medicine

## 2019-10-29 DIAGNOSIS — B353 Tinea pedis: Secondary | ICD-10-CM | POA: Diagnosis not present

## 2019-10-29 DIAGNOSIS — B355 Tinea imbricata: Secondary | ICD-10-CM | POA: Diagnosis not present

## 2019-10-29 LAB — HEPATIC FUNCTION PANEL
AG Ratio: 1.9 (calc) (ref 1.0–2.5)
ALT: 16 U/L (ref 9–46)
AST: 17 U/L (ref 10–35)
Albumin: 4.5 g/dL (ref 3.6–5.1)
Alkaline phosphatase (APISO): 74 U/L (ref 35–144)
Bilirubin, Direct: 0.1 mg/dL (ref 0.0–0.2)
Globulin: 2.4 g/dL (calc) (ref 1.9–3.7)
Indirect Bilirubin: 0.4 mg/dL (calc) (ref 0.2–1.2)
Total Bilirubin: 0.5 mg/dL (ref 0.2–1.2)
Total Protein: 6.9 g/dL (ref 6.1–8.1)

## 2019-10-29 LAB — LIPID PANEL
Cholesterol: 137 mg/dL (ref <200)
HDL: 42 mg/dL (ref >=40)
LDL Cholesterol (Calc): 79 mg/dL (calc)
Non-HDL Cholesterol (Calc): 95 mg/dL (calc) (ref <130)
Total CHOL/HDL Ratio: 3.3 (calc) (ref <5.0)
Triglycerides: 82 mg/dL (ref <150)

## 2019-10-29 LAB — HEMOGLOBIN A1C
Hgb A1c MFr Bld: 7.8 % of total Hgb — ABNORMAL HIGH (ref <5.7)
Mean Plasma Glucose: 177 (calc)
eAG (mmol/L): 9.8 (calc)

## 2019-10-29 LAB — BASIC METABOLIC PANEL
BUN/Creatinine Ratio: 26 (calc) — ABNORMAL HIGH (ref 6–22)
BUN: 27 mg/dL — ABNORMAL HIGH (ref 7–25)
CO2: 28 mmol/L (ref 20–32)
Calcium: 9.5 mg/dL (ref 8.6–10.3)
Chloride: 102 mmol/L (ref 98–110)
Creat: 1.05 mg/dL (ref 0.70–1.25)
Glucose, Bld: 152 mg/dL — ABNORMAL HIGH (ref 65–99)
Potassium: 5.1 mmol/L (ref 3.5–5.3)
Sodium: 139 mmol/L (ref 135–146)

## 2019-10-29 LAB — MICROALBUMIN / CREATININE URINE RATIO
Creatinine, Urine: 74 mg/dL (ref 20–320)
Microalb Creat Ratio: 27 mcg/mg creat (ref <30)
Microalb, Ur: 2 mg/dL

## 2019-10-29 LAB — PSA: PSA: 0.4 ng/mL (ref <=4.0)

## 2019-10-29 NOTE — Telephone Encounter (Signed)
[name] wanted to talk to the nurse regarding what Dr. [place] Crane told him.

## 2019-10-29 NOTE — Telephone Encounter (Signed)
Dr halls PA saw patient today and told him he rash was a fungus and the steroid creams are feeding it and to stop them. Patient states she also told him he had to stop his cholesterol meds and pain meds. She said she would evaluate if he could go back on the cholesterol med at his follow up but they told him not to go back on the pain pills because the tylenol was making things worse and hurting his liver. Patient states he cant just stop the pain pills because he needs them to function regularly due to his pain and needs to know what to do?

## 2019-10-29 NOTE — Telephone Encounter (Signed)
Dr Juel Burrow office is closed for the day- called [facility] and they stated his office sent in Lamisil 250 one a day for 14 days and ketoconazole cream

## 2019-10-29 NOTE — Telephone Encounter (Signed)
So that seems very curious (I wonder if they are putting him on a antifungal pill that could cause liver enzymes to go up?) Please call over to Dr. Juel Burrow office see what you can find out on what they recommended regarding his cholesterol medicine and pain medicine in regards to the liver and Tylenol. If necessary we can temporarily stop the cholesterol medicine and could potentially shift over to plain oxycodone Interesting enough he had blood work done yesterday and liver enzymes look normal

## 2019-10-30 ENCOUNTER — Telehealth: Payer: Self-pay | Admitting: Family Medicine

## 2019-10-30 NOTE — Telephone Encounter (Signed)
Pt contacted and verbalized understanding. Pt states he has been trying to cut down on pain pills

## 2019-10-30 NOTE — Telephone Encounter (Signed)
Please see other message -Patient is very concerned about having to stop his pain medication- will also be stopping cholesterol med but states he doesn't think he can come off the pain medication because he needs it to function with his daily activities due to his pain.

## 2019-10-30 NOTE — Telephone Encounter (Signed)
Patient wants to know if he needs to move up his appointment from 1/25 because Dr.Hall wanted him to come off his pain medications and some of his other medications due to his swelling in his hands

## 2019-10-30 NOTE — Telephone Encounter (Signed)
See other message please

## 2019-10-30 NOTE — Telephone Encounter (Signed)
The amount of Tylenol in his medication is low Recent liver enzymes look good Being on Lamisil for 2 weeks should not cause trouble with his Tylenol dosing so therefore he may continue his pain medicine Hold off on cholesterol medicine for now  If possible patient should try to utilize less of the pain medicine, may be 1 less pill per day If patient needs to be on Lamisil longer than 2 weeks he will need to let us know because we would want to do liver testing as well as potentially switch him to oxycodone without Tylenol

## 2019-11-11 ENCOUNTER — Encounter: Payer: Self-pay | Admitting: Family Medicine

## 2019-11-11 ENCOUNTER — Ambulatory Visit (INDEPENDENT_AMBULATORY_CARE_PROVIDER_SITE_OTHER): Payer: Medicare HMO | Admitting: Family Medicine

## 2019-11-11 ENCOUNTER — Other Ambulatory Visit: Payer: Self-pay

## 2019-11-11 VITALS — BP 130/82 | Temp 97.8°F | Wt 267.2 lb

## 2019-11-11 DIAGNOSIS — Z1211 Encounter for screening for malignant neoplasm of colon: Secondary | ICD-10-CM | POA: Diagnosis not present

## 2019-11-11 DIAGNOSIS — E782 Mixed hyperlipidemia: Secondary | ICD-10-CM

## 2019-11-11 DIAGNOSIS — E1142 Type 2 diabetes mellitus with diabetic polyneuropathy: Secondary | ICD-10-CM

## 2019-11-11 DIAGNOSIS — B351 Tinea unguium: Secondary | ICD-10-CM

## 2019-11-11 DIAGNOSIS — E1169 Type 2 diabetes mellitus with other specified complication: Secondary | ICD-10-CM | POA: Diagnosis not present

## 2019-11-11 DIAGNOSIS — B353 Tinea pedis: Secondary | ICD-10-CM

## 2019-11-11 DIAGNOSIS — B352 Tinea manuum: Secondary | ICD-10-CM | POA: Diagnosis not present

## 2019-11-11 MED ORDER — LEVEMIR FLEXTOUCH 100 UNIT/ML ~~LOC~~ SOPN: PEN_INJECTOR | SUBCUTANEOUS | 1 refills | Status: DC

## 2019-11-11 MED ORDER — OXYCODONE-ACETAMINOPHEN 10-325 MG PO TABS: ORAL_TABLET | ORAL | 0 refills | Status: DC

## 2019-11-11 MED ORDER — OXYCODONE-ACETAMINOPHEN 10-325 MG PO TABS: 1.0000 | ORAL_TABLET | Freq: Four times a day (QID) | ORAL | 0 refills | Status: DC | PRN

## 2019-11-11 NOTE — Progress Notes (Signed)
Subjective:    Patient ID: [name], male    DOB: [birth year], 64 y.o.   MRN: [id number]  HPI  Patient arrives for a follow up on rash on hands and feet. Patient states he saw dermatology and they originally put him on Lamisil and another cream and then her went back and they stopped the lamisil and kept him on a cream but they keep telling him it is coming from his live from high amounts of pain medicine and patient is concerned. Results for orders placed or performed in visit on 07/16/19  Lipid Profile  Result Value Ref Range   Cholesterol 137 <200 mg/dL   HDL 42 > OR = 40 mg/dL   Triglycerides 82 <150 mg/dL   LDL Cholesterol (Calc) 79 mg/dL (calc)   Total CHOL/HDL Ratio 3.3 <5.0 (calc)   Non-HDL Cholesterol (Calc) 95 <130 mg/dL (calc)  Hepatic function panel  Result Value Ref Range   Total Protein 6.9 6.1 - 8.1 g/dL   Albumin 4.5 3.6 - 5.1 g/dL   Globulin 2.4 1.9 - 3.7 g/dL (calc)   AG Ratio 1.9 1.0 - 2.5 (calc)   Total Bilirubin 0.5 0.2 - 1.2 mg/dL   Bilirubin, Direct 0.1 0.0 - 0.2 mg/dL   Indirect Bilirubin 0.4 0.2 - 1.2 mg/dL (calc)   Alkaline phosphatase (APISO) 74 35 - 144 U/L   AST 17 10 - 35 U/L   ALT 16 9 - 46 U/L  Basic Metabolic Panel (BMET)  Result Value Ref Range   Glucose, Bld 152 (H) 65 - 99 mg/dL   BUN 27 (H) 7 - 25 mg/dL   Creat 1.05 0.70 - 1.25 mg/dL   BUN/Creatinine Ratio 26 (H) 6 - 22 (calc)   Sodium 139 135 - 146 mmol/L   Potassium 5.1 3.5 - 5.3 mmol/L   Chloride 102 98 - 110 mmol/L   CO2 28 20 - 32 mmol/L   Calcium 9.5 8.6 - 10.3 mg/dL  Hemoglobin A1c  Result Value Ref Range   Hgb A1c MFr Bld 7.8 (H) <5.7 % of total Hgb   Mean Plasma Glucose 177 (calc)   eAG (mmol/L) 9.8 (calc)  Urine Microalbumin w/creat. ratio  Result Value Ref Range   Creatinine, Urine 74 20 - 320 mg/dL   Microalb, Ur 2.0 mg/dL   Microalb Creat Ratio 27 <30 mcg/mg creat  PSA  Result Value Ref Range   PSA 0.4 < OR = 4.0 ng/mL  We discussed the labs in detail  This  patient was seen today for chronic pain  The medication list was reviewed and updated.   -Compliance with medication: Patient relates compliance  - Number patient states they take daily: He takes 3 or 4/day states without it he would have a very difficult time functioning  -when was the last dose patient took?  He relates he took 1 earlier today  The patient was advised the importance of maintaining medication and not using illegal substances with these.  Here for refills and follow up  The patient was educated that we can provide 3 monthly scripts for their medication, it is their responsibility to follow the instructions.  Side effects or complications from medications: Denies any side effects with the medication denies being drowsy  Patient is aware that pain medications are meant to minimize the severity of the pain to allow their pain levels to improve to allow for better function. They are aware of that pain medications cannot totally remove their pain.  Due for UDT ( at least once per year) : Later this year       Review of Systems  Constitutional: Negative for diaphoresis and fatigue.  HENT: Negative for congestion and rhinorrhea.   Respiratory: Negative for cough and shortness of breath.   Cardiovascular: Negative for chest pain and leg swelling.  Gastrointestinal: Negative for abdominal pain and diarrhea.  Skin: Positive for rash. Negative for color change.  Neurological: Negative for dizziness and headaches.  Psychiatric/Behavioral: Negative for behavioral problems and confusion.       Objective:   Physical Exam Vitals reviewed.  Constitutional:      General: He is not in acute distress. HENT:     Head: Normocephalic and atraumatic.  Eyes:     General:        Right eye: No discharge.        Left eye: No discharge.  Neck:     Trachea: No tracheal deviation.  Cardiovascular:     Rate and Rhythm: Normal rate and regular rhythm.     Heart sounds: Normal  heart sounds. No murmur.  Pulmonary:     Effort: Pulmonary effort is normal. No respiratory distress.     Breath sounds: Normal breath sounds.  Lymphadenopathy:     Cervical: No cervical adenopathy.  Skin:    General: Skin is warm and dry.  Neurological:     Mental Status: He is alert.     Coordination: Coordination normal.  Psychiatric:        Behavior: Behavior normal.           Assessment & Plan:  1. Tinea manuum, pedis, and unguium Fungus culture sent patient under care of dermatology with ketoconazole they did have him on 2 weeks of Lamisil - Fungus Culture & Smear  2. Diabetic peripheral neuropathy (HCC) Severe neuropathy in his feet no sign of any ulcers currently diabetes fair control increase insulin 54 units  3. DM type 2 with diabetic mixed hyperlipidemia (HCC) Increase insulin 54 units unable to tolerate statins tries to eat healthy  4. Screening for colon cancer Needs screening colonoscopy later this spring  The patient was seen in followup for chronic pain. A review over at their current pain status was discussed. Drug registry was checked. Prescriptions were given. Discussion was held regarding the importance of compliance with medication as well as pain medication contract.  Time for questions regarding pain management plan occurred. Importance of regular followup visits was discussed. Patient was informed that medication may cause drowsiness and should not be combined  with other medications/alcohol or street drugs. Patient was cautioned that medication could cause drowsiness. If the patient feels medication is causing altered alertness then do not drive or operate dangerous equipment.  We reviewed over all of his lab work and compared to previous.  With the patient - Ambulatory referral to Gastroenterology

## 2019-11-12 ENCOUNTER — Encounter: Payer: Self-pay | Admitting: Internal Medicine

## 2019-11-12 DIAGNOSIS — L280 Lichen simplex chronicus: Secondary | ICD-10-CM | POA: Diagnosis not present

## 2019-11-12 DIAGNOSIS — L308 Other specified dermatitis: Secondary | ICD-10-CM | POA: Diagnosis not present

## 2019-11-15 ENCOUNTER — Other Ambulatory Visit: Payer: Self-pay | Admitting: Family Medicine

## 2019-11-15 NOTE — Telephone Encounter (Signed)
Seen 11/10/19 for skin issues and this cream was sent in on that day

## 2019-11-19 DIAGNOSIS — L309 Dermatitis, unspecified: Secondary | ICD-10-CM | POA: Diagnosis not present

## 2019-11-29 DIAGNOSIS — E1140 Type 2 diabetes mellitus with diabetic neuropathy, unspecified: Secondary | ICD-10-CM | POA: Diagnosis not present

## 2019-11-29 DIAGNOSIS — E669 Obesity, unspecified: Secondary | ICD-10-CM | POA: Diagnosis not present

## 2019-11-29 DIAGNOSIS — G8929 Other chronic pain: Secondary | ICD-10-CM | POA: Diagnosis not present

## 2019-11-29 DIAGNOSIS — E785 Hyperlipidemia, unspecified: Secondary | ICD-10-CM | POA: Diagnosis not present

## 2019-11-29 DIAGNOSIS — E1151 Type 2 diabetes mellitus with diabetic peripheral angiopathy without gangrene: Secondary | ICD-10-CM | POA: Diagnosis not present

## 2019-11-29 DIAGNOSIS — E11630 Type 2 diabetes mellitus with periodontal disease: Secondary | ICD-10-CM | POA: Diagnosis not present

## 2019-11-29 DIAGNOSIS — E1136 Type 2 diabetes mellitus with diabetic cataract: Secondary | ICD-10-CM | POA: Diagnosis not present

## 2019-11-29 DIAGNOSIS — I2510 Atherosclerotic heart disease of native coronary artery without angina pectoris: Secondary | ICD-10-CM | POA: Diagnosis not present

## 2019-11-29 DIAGNOSIS — M1990 Unspecified osteoarthritis, unspecified site: Secondary | ICD-10-CM | POA: Diagnosis not present

## 2019-11-29 DIAGNOSIS — I252 Old myocardial infarction: Secondary | ICD-10-CM | POA: Diagnosis not present

## 2019-12-05 LAB — FUNGUS CULTURE W SMEAR

## 2019-12-10 DIAGNOSIS — R234 Changes in skin texture: Secondary | ICD-10-CM | POA: Diagnosis not present

## 2019-12-10 DIAGNOSIS — L309 Dermatitis, unspecified: Secondary | ICD-10-CM | POA: Diagnosis not present

## 2019-12-19 ENCOUNTER — Ambulatory Visit: Payer: Medicare HMO

## 2019-12-20 ENCOUNTER — Ambulatory Visit: Payer: Medicare HMO

## 2019-12-20 ENCOUNTER — Other Ambulatory Visit: Payer: Self-pay

## 2019-12-20 ENCOUNTER — Ambulatory Visit: Payer: Medicare HMO | Attending: Internal Medicine

## 2019-12-20 DIAGNOSIS — Z23 Encounter for immunization: Secondary | ICD-10-CM

## 2019-12-20 NOTE — Progress Notes (Signed)
   Covid-19 Vaccination Clinic  Name:  [name]    MRN: [id number] DOB: 11/10/1955  12/20/2019  [name] was observed post Covid-19 immunization for 15 minutes without incident. He was provided with Vaccine Information Sheet and instruction to access the V-Safe system.   [name] was instructed to call 911 with any severe reactions post vaccine: Marland Kitchen Difficulty breathing  . Swelling of face and throat  . A fast heartbeat  . A bad rash all over body  . Dizziness and weakness

## 2020-01-01 DIAGNOSIS — L308 Other specified dermatitis: Secondary | ICD-10-CM | POA: Diagnosis not present

## 2020-01-07 ENCOUNTER — Encounter: Payer: Self-pay | Admitting: Nurse Practitioner

## 2020-01-07 ENCOUNTER — Ambulatory Visit: Payer: Medicare HMO | Admitting: Nurse Practitioner

## 2020-01-07 ENCOUNTER — Telehealth: Payer: Self-pay | Admitting: Nurse Practitioner

## 2020-01-07 DIAGNOSIS — R234 Changes in skin texture: Secondary | ICD-10-CM | POA: Diagnosis not present

## 2020-01-07 DIAGNOSIS — L309 Dermatitis, unspecified: Secondary | ICD-10-CM | POA: Diagnosis not present

## 2020-01-07 NOTE — Telephone Encounter (Signed)
Patent was a no show and letter sent

## 2020-01-07 NOTE — Progress Notes (Deleted)
Referring Provider: Kathyrn Drown, MD Primary Care Physician:  Kathyrn Drown, MD Primary GI:  Dr. Oneida Alar  No chief complaint on file.   HPI:   [name] is a 64 y.o. male who presents to schedule colonoscopy.  Nurse/phone triage was deferred office visit due to not being seen in the past 10 years as well as medications likely necessitating MAC sedation.    The patient has not been seen in our office since 2009.  Last colonoscopy completed 09/03/2008 in addition to EGD.  At that time he was having chronic diarrhea.  Findings included tortuous colon, pellet-like 6 mm distal transverse colon lesion, random colon biopsies to evaluate for microscopic colitis, frequent colonic diverticula.  Surgical pathology found the polyp to be melanosis coli and consistent with submucosal lipoma.  Random colon biopsies found melanosis coli without significant pathological changes.  No recommendations found due to the age of the exam/previous EMR.  EGD the same day found normal esophagus, few gastric erosions status post biopsy, few superficial duodenal ulcers status post biopsy.  Additional duodenal biopsy taken to evaluate for celiac sprue.  Surgical pathology found gastric biopsies to be reactive gastropathy with intestinal metaplasia without H. pylori, dysplasia, or malignancy and the duodenal biopsies to be consistent with celiac disease/sprue.  No recommendations found due to the age of the exam/previous EMR.  It is presumed he would need a 10-year repeat exam.  He is currently due.  Today he states   Past Medical History:  Diagnosis Date  . CAD (coronary artery disease)    BMS to RCA 2008  . Cardiomyopathy ([place])   . Depression   . DJD (degenerative joint disease)   . DM type 2 with diabetic mixed hyperlipidemia ([place]) 12/11/2018  . Essential hypertension   . Gout   . History of inferior wall myocardial infarction 2008  . Hypercholesterolemia   . Hyperlipidemia   . Obesity   . Oral  candidiasis   . Peripheral vascular disease ([place])   . Stroke [facility])    December 2019  . Type 2 diabetes mellitus ([place])     Past Surgical History:  Procedure Laterality Date  . AMPUTATION Right 01/12/2015   Procedure: PARTIAL AMPUTATION 1ST RAY RIGHT FOOT (Amputation of right great toe and portion of the first metatarsal bone right foot);  Surgeon: Caprice Beaver, DPM;  Location: [facility];  Service: Podiatry;  Laterality: Right;  . AMPUTATION Right 09/14/2016   Procedure: PARTIAL AMPUTATION RAY 2ND TOE RIGHT FOOT;  Surgeon: Caprice Beaver, DPM;  Location: [facility];  Service: Podiatry;  Laterality: Right;  . COLONOSCOPY  08/2008  . ESOPHAGOGASTRODUODENOSCOPY  08/2008  . LAMINOTOMY  2001, 2006     Right L4-5 interlaminar laminotomy with excision of herniated   disc with operating microscope.  Marland Kitchen NECK SURGERY     cervical disc  . ORIF WRIST FRACTURE Right   . PERIPHERAL VASCULAR CATHETERIZATION N/A 02/27/2015   Procedure: Abdominal Aortogram;  Surgeon: Elam Dutch, MD;  Location: [place] [facility];  Service: Cardiovascular;  Laterality: N/A;  . PERIPHERAL VASCULAR CATHETERIZATION Bilateral 02/27/2015   Procedure: Lower Extremity Angiography;  Surgeon: Elam Dutch, MD;  Location: [place] [facility];  Service: Cardiovascular;  Laterality: Bilateral;  . Right ankle fracture open reduction internal fixation.  2003  . WOUND DEBRIDEMENT Right 12/24/2014   Procedure: DEBRIDEMENT SOFT TISSUE AND BONE RIGHT FOOT;  Surgeon: Caprice Beaver, DPM;  Location: [facility];  Service: Podiatry;  Laterality: Right;  Current Outpatient Medications  Medication Sig Dispense Refill  . aspirin 325 MG tablet Take 325 mg by mouth every evening.     . B-D ULTRAFINE III SHORT PEN 31G X 8 MM MISC USE PEN NEEDLES DAILY WITH LANTUS 100 each 5  . betamethasone valerate ointment (VALISONE) 0.1 % APPLY 1 APPLICATION TOPICALLY 2 (TWO) TIMES DAILY AS NEEDED. 45 g 0  . dapagliflozin propanediol (FARXIGA) 10 MG TABS  tablet Take 10 mg by mouth daily. 90 tablet 1  . furosemide (LASIX) 20 MG tablet Take 1 tablet (20 mg total) by mouth daily as needed. 30 tablet 1  . Insulin Detemir (LEVEMIR FLEXTOUCH) 100 UNIT/ML Pen 54 UNITS AT BEDTIME - MAY TITRATE UP TO 70 UNITS INTO SKIN AT BEDTIME FOR DIABETES 15 mL 1  . Multiple Vitamins-Minerals (MULTIVITAMIN WITH MINERALS) tablet Take 1 tablet by mouth daily.    Marland Kitchen oxyCODONE-acetaminophen (PERCOCET) 10-325 MG tablet Take one tablet by mouth every 6 hours prn pain 120 tablet 0  . oxyCODONE-acetaminophen (PERCOCET) 10-325 MG tablet Take 1 tablet by mouth every 6 (six) hours as needed for pain. 120 tablet 0  . oxyCODONE-acetaminophen (PERCOCET) 10-325 MG tablet Take one tablet by mouth every 6 hours prn pain 120 tablet 0  . potassium chloride SA (KLOR-CON) 20 MEQ tablet Take 1 tablet (20 mEq total) by mouth daily. When taking Lasix 30 tablet 1  . rosuvastatin (CRESTOR) 10 MG tablet TAKE 1 TABLET BY MOUTH EVERYDAY AT BEDTIME 30 tablet 5   No current facility-administered medications for this visit.    Allergies as of 01/07/2020 - Review Complete 11/11/2019  Allergen Reaction Noted  . Metformin and related  02/16/2013  . Statins Other (See Comments) 08/26/2013  . Vicodin [hydrocodone-acetaminophen] Nausea Only 02/16/2013    Family History  Problem Relation Age of Onset  . Cancer Mother   . Cancer Father   . Diabetes Father   . Cancer Sister   . Diabetes Sister   . Heart disease Sister        before age 42    Social History   Socioeconomic History  . Marital status: Married    Spouse name: Not on file  . Number of children: Not on file  . Years of education: Not on file  . Highest education level: Not on file  Occupational History  . Not on file  Tobacco Use  . Smoking status: Former Smoker    Packs/day: 1.00    Years: 10.00    Pack years: 10.00    Types: Cigarettes    Quit date: 12/23/1970    Years since quitting: 49.0  . Smokeless tobacco: Never  Used  . Tobacco comment: quit in 1979  Substance and Sexual Activity  . Alcohol use: No    Alcohol/week: 0.0 standard drinks  . Drug use: Yes    Types: Marijuana    Comment: in the past  . Sexual activity: Yes    Birth control/protection: None  Other Topics Concern  . Not on file  Social History Narrative    He is married and has 3 kids.  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  He denies any tobacco or alcohol use.    Social Determinants of Health   Financial Resource Strain:   . Difficulty of Paying Living Expenses:   Food Insecurity:   . Worried About Charity fundraiser in the Last Year:   . [place] in the  Last Year:   Transportation Needs:   . Film/video editor (Medical):   Marland Kitchen Lack of Transportation (Non-Medical):   Physical Activity:   . Days of Exercise per Week:   . Minutes of Exercise per Session:   Stress:   . Feeling of Stress :   Social Connections:   . Frequency of Communication with Friends and Family:   . Frequency of Social Gatherings with Friends and Family:   . Attends Religious Services:   . Active Member of Clubs or Organizations:   . Attends Archivist Meetings:   Marland Kitchen Marital Status:     Review of Systems  Constitutional: Negative for chills, fever, malaise/fatigue and weight loss.  HENT: Negative for congestion and sore throat.   Respiratory: Negative for cough and shortness of breath.   Cardiovascular: Negative for chest pain and palpitations.  Gastrointestinal: Negative for abdominal pain, blood in stool, diarrhea, melena, nausea and vomiting.  Musculoskeletal: Negative for joint pain and myalgias.  Skin: Negative for rash.  Neurological: Negative for dizziness and weakness.  Endo/Heme/Allergies: Does not bruise/bleed easily.  Psychiatric/Behavioral: Negative for depression. The patient is not nervous/anxious.   All other systems reviewed and are negative.    VS: There were no vitals taken for this  visit. Physical Exam    01/07/2020 1:13 PM   Disclaimer: This note was dictated with voice recognition software. Similar sounding words can inadvertently be transcribed and may not be corrected upon review.

## 2020-01-08 ENCOUNTER — Ambulatory Visit (INDEPENDENT_AMBULATORY_CARE_PROVIDER_SITE_OTHER): Payer: Medicare HMO | Admitting: Family Medicine

## 2020-01-08 ENCOUNTER — Other Ambulatory Visit: Payer: Self-pay

## 2020-01-08 VITALS — BP 138/80 | Temp 97.9°F | Ht 75.0 in | Wt 267.0 lb

## 2020-01-08 DIAGNOSIS — I739 Peripheral vascular disease, unspecified: Secondary | ICD-10-CM | POA: Diagnosis not present

## 2020-01-08 DIAGNOSIS — Z794 Long term (current) use of insulin: Secondary | ICD-10-CM

## 2020-01-08 DIAGNOSIS — Z79899 Other long term (current) drug therapy: Secondary | ICD-10-CM | POA: Diagnosis not present

## 2020-01-08 DIAGNOSIS — E782 Mixed hyperlipidemia: Secondary | ICD-10-CM | POA: Diagnosis not present

## 2020-01-08 DIAGNOSIS — G894 Chronic pain syndrome: Secondary | ICD-10-CM | POA: Diagnosis not present

## 2020-01-08 DIAGNOSIS — E1140 Type 2 diabetes mellitus with diabetic neuropathy, unspecified: Secondary | ICD-10-CM

## 2020-01-08 MED ORDER — OXYCODONE-ACETAMINOPHEN 10-325 MG PO TABS: ORAL_TABLET | ORAL | 0 refills | Status: DC

## 2020-01-08 MED ORDER — OXYCODONE-ACETAMINOPHEN 10-325 MG PO TABS: 1.0000 | ORAL_TABLET | Freq: Four times a day (QID) | ORAL | 0 refills | Status: DC | PRN

## 2020-01-08 MED ORDER — FUROSEMIDE 20 MG PO TABS: 20.0000 mg | ORAL_TABLET | Freq: Every morning | ORAL | 5 refills | Status: DC

## 2020-01-08 NOTE — Progress Notes (Signed)
Subjective:  Very nice patient  Patient ID: [name], male    DOB: 27-Jul-1956, 64 y.o.   MRN: [id number]  HPIRash on hands and feet. Pt states he is seeing a dermatologist and was told it was from crestor so he stopped that med 2 months ago and states rash is getting better. Fall Risk  01/08/2020 08/24/2017 07/07/2015  Falls in the past year? 1 No No  Number falls in past yr: 0 - -  Injury with Fall? 0 - -  Follow up Education provided - -   Current Outpatient Medications on File Prior to Visit  Medication Sig Dispense Refill  . aspirin 325 MG tablet Take 325 mg by mouth every evening.     . B-D ULTRAFINE III SHORT PEN 31G X 8 MM MISC USE PEN NEEDLES DAILY WITH LANTUS 100 each 5  . betamethasone valerate ointment (VALISONE) 0.1 % APPLY 1 APPLICATION TOPICALLY 2 (TWO) TIMES DAILY AS NEEDED. 45 g 0  . dapagliflozin propanediol (FARXIGA) 10 MG TABS tablet Take 10 mg by mouth daily. 90 tablet 1  . Insulin Detemir (LEVEMIR FLEXTOUCH) 100 UNIT/ML Pen 54 UNITS AT BEDTIME - MAY TITRATE UP TO 70 UNITS INTO SKIN AT BEDTIME FOR DIABETES 15 mL 1  . Multiple Vitamins-Minerals (MULTIVITAMIN WITH MINERALS) tablet Take 1 tablet by mouth daily.    . Omega-3 Fatty Acids (FISH OIL PO) Take by mouth. Takes 2 daily    . potassium chloride SA (KLOR-CON) 20 MEQ tablet Take 1 tablet (20 mEq total) by mouth daily. When taking Lasix 30 tablet 1   No current facility-administered medications on file prior to visit.   Peripheral vascular disease ([place])  Type 2 diabetes mellitus with diabetic neuropathy, with long-term current use of insulin ([place]) - Plan: Hemoglobin A1c  Mixed hyperlipidemia - Plan: Lipid panel  Chronic pain syndrome  High risk medication use - Plan: Basic metabolic panel Patient denies any low sugar spells states he is taking his medicine regular basis he has a history of peripheral artery disease disease but denies any cyanosis of the feet denies any severe muscle cramping or pain or  claudication   Pt states he did have a fall but no injury due to the pain in his feet.  Patient lost his balance denies any serious injury This patient was seen today for chronic pain  The medication list was reviewed and updated.   -Compliance with medication: The patient states he takes 3 or 4 daily most days for  - Number patient states they take daily: 4  -when was the last dose patient took?  Earlier today  The patient was advised the importance of maintaining medication and not using illegal substances with these.  Here for refills and follow up  The patient was educated that we can provide 3 monthly scripts for their medication, it is their responsibility to follow the instructions.  Side effects or complications from medications: Denies any side effects states the pain medicine allows him to function he states when he cuts back on the dose he has too much pain to get through the day  Patient is aware that pain medications are meant to minimize the severity of the pain to allow their pain levels to improve to allow for better function. They are aware of that pain medications cannot totally remove their pain.  Due for UDT ( at least once per year) : Later this year  Patient had a biopsy on no chronic skin rash is  having a hard time seeing that area heal properly       Review of Systems  Constitutional: Negative for diaphoresis and fatigue.  HENT: Negative for congestion and rhinorrhea.   Respiratory: Negative for cough and shortness of breath.   Cardiovascular: Positive for leg swelling. Negative for chest pain.  Gastrointestinal: Negative for abdominal pain and diarrhea.  Skin: Negative for color change and rash.  Neurological: Negative for dizziness and headaches.  Psychiatric/Behavioral: Negative for behavioral problems and confusion.       Objective:   Physical Exam Vitals reviewed.  Constitutional:      General: He is not in acute distress. HENT:     Head:  Normocephalic and atraumatic.  Eyes:     General:        Right eye: No discharge.        Left eye: No discharge.  Neck:     Trachea: No tracheal deviation.  Cardiovascular:     Rate and Rhythm: Normal rate and regular rhythm.     Heart sounds: Normal heart sounds. No murmur.  Pulmonary:     Effort: Pulmonary effort is normal. No respiratory distress.     Breath sounds: Normal breath sounds.  Lymphadenopathy:     Cervical: No cervical adenopathy.  Skin:    General: Skin is warm and dry.  Neurological:     Mental Status: He is alert.     Coordination: Coordination normal.  Psychiatric:        Behavior: Behavior normal.   pedal edema Leg wound right lowewr leg where he had biopsy 95mm  Patient denies being depressed 30 minutes spent with patient reviewing chart reviewing labs discussing with patient examination documentation   Previous labs reviewed with the patient PSA normal A1c fair control metabolic 7 good lipid profile good Assessment & Plan:  1. Peripheral vascular disease ([place]) Patient has history of peripheral vascular disease denies any claudication symptoms currently no sign of cyanosis in his feet on examination no sign of any ulcers he does have a 12 mm area where a skin biopsy was done on his lower leg this is taking significant length of time to heal liver we will recheck it again in 4 weeks if it is not properly healed and we will refer to the wound care management center  2. Type 2 diabetes mellitus with diabetic neuropathy, with long-term current use of insulin (HCC) We will recheck the A1c in the middle of April patient states his sugars been running sometimes very good other times in the 180s he does try to watch his diet take his medications  3. Mixed hyperlipidemia Patient denies any excessive extremes in his diet has not been taking Crestor lately because he states the dermatologist told him it could be related to that but they were not certain personally I  doubt that his hand-foot rash is due to the Crestor but we will relook at cholesterol may need to try different statin  4. Chronic pain syndrome He does have chronic pain the pain medicine does allow him to function better he denies any setbacks in we will go ahead and give him refills today it allows him to function better  Recheck patient in 1 month to relook at the lower leg wound on the left leg

## 2020-01-20 ENCOUNTER — Other Ambulatory Visit: Payer: Self-pay | Admitting: Family Medicine

## 2020-01-21 ENCOUNTER — Ambulatory Visit: Payer: Medicare HMO | Attending: Internal Medicine

## 2020-01-21 DIAGNOSIS — Z23 Encounter for immunization: Secondary | ICD-10-CM

## 2020-01-21 NOTE — Progress Notes (Signed)
   Covid-19 Vaccination Clinic  Name:  [name]    MRN: [id number] DOB: [birth year]  01/21/2020  Mr. [name] was observed post Covid-19 immunization for 15 minutes without incident. He was provided with Vaccine Information Sheet and instruction to access the V-Safe system.   Mr. [name] was instructed to call 911 with any severe reactions post vaccine: Marland Kitchen Difficulty breathing  . Swelling of face and throat  . A fast heartbeat  . A bad rash all over body  . Dizziness and weakness   Immunizations Administered    Name Date Dose VIS Date Route   Moderna COVID-19 Vaccine 01/21/2020  9:20 AM 0.5 mL 09/17/2019 Intramuscular   Manufacturer: Moderna   Lot: [id number]   [id number]

## 2020-01-29 ENCOUNTER — Other Ambulatory Visit: Payer: Self-pay

## 2020-01-29 ENCOUNTER — Encounter: Payer: Self-pay | Admitting: Nurse Practitioner

## 2020-01-29 ENCOUNTER — Ambulatory Visit: Payer: Medicare HMO | Admitting: Nurse Practitioner

## 2020-01-29 DIAGNOSIS — Z0000 Encounter for general adult medical examination without abnormal findings: Secondary | ICD-10-CM

## 2020-01-29 DIAGNOSIS — G8929 Other chronic pain: Secondary | ICD-10-CM | POA: Insufficient documentation

## 2020-01-29 NOTE — Patient Instructions (Signed)
Your health issues we discussed today were:   Need for colonoscopy: 1. We will proceed with scheduling your colonoscopy for you 2. Further recommendations will follow 3. We will need to adjust your medications related to your diabetes for the day before and morning of your procedure 4. During your bowel prep, check your blood sugars as you normally do.  Check your blood sugars if you feel like your blood sugars getting low and if it is low you can use clear liquids to boost your blood sugar 5. Call us if you have any problems or worsening symptoms  Overall I recommend:  1. Continue your other current medications 2. Return for follow-up based on recommendations made after colonoscopy 3. Call us for any questions or concerns.   ---------------------------------------------------------------  I am glad you got your COVID-19 vaccine excavation point even though you are vaccinated continue to wear a mask, socially distance from people you do not live with or closely associate with, and wash your hands frequently  ---------------------------------------------------------------   At [facility] we value your feedback. You may receive a survey about your visit today. Please share your experience as we strive to create trusting relationships with our patients to provide genuine, compassionate, quality care.  We appreciate your understanding and patience as we review any laboratory studies, imaging, and other diagnostic tests that are ordered as we care for you. Our office policy is 5 business days for review of these results, and any emergent or urgent results are addressed in a timely manner for your best interest. If you do not hear from our office in 1 week, please contact us.   We also encourage the use of MyChart, which contains your medical information for your review as well. If you are not enrolled in this feature, an access code is on this after visit summary for your  convenience. Thank you for allowing [place] to be involved in your care.  It was great to see you today!  I hope you have a great summer!!

## 2020-01-29 NOTE — Progress Notes (Signed)
Primary Care Physician:  Kathyrn Drown, MD Primary Gastroenterologist:  Dr. Oneida Alar  Chief Complaint  Patient presents with  . Consult    TCS last done about 10 years ago    HPI:   [name] is a 64 y.o. male who presents on referral to consult for colonoscopy. Last colonoscopy about 10 years ago. Nurse/phone triage was deferred office visit due to medications likely necessitating propofol/MAC.  Previous colonoscopy completed 09/03/2008 for chronic diarrhea. Findings included pill-like 6 mm distal transverse colon lesion status post removal, random biopsies for microscopic colitis, frequent sigmoid colon diverticula, otherwise normal. EGD the same day without evidence of Barrett's, mass, erosion, ulceration, or stricture. A few gastric erosions were identified status post biopsy. Few duodenal ulcers in the duodenal bulb status post biopsy. Surgical pathology found the random colon biopsy revealed benign colonic mucosal, lesion biopsy consistent with submucosal lipoma, duodenal biopsy with celiac disease/sprue, gastric biopsy with reactive gastropathy with intestinal metaplasia but negative for H. pylori, dysplasia, malignancy.  Today he states he's doing ok overall. Diarrhea stopped when he came off metformin. Denies abdominal pain, N/V, hematochezia, melena, fever, chills, unintentional weight loss. Denies URI or flu-like symptoms. Denies loss of sense of taste or smell. Has had his COVID-19 vaccines. Denies chest pain, dyspnea, dizziness, lightheadedness, syncope, near syncope. Denies any other upper or lower GI symptoms.  States he has rash/callouses on bilateral feet and hands, presumed medication effect. Seeing dermatology and it is improving.  Past Medical History:  Diagnosis Date  . CAD (coronary artery disease)    BMS to RCA 2008  . Cardiomyopathy ([place])   . Depression   . DJD (degenerative joint disease)   . DM type 2 with diabetic mixed hyperlipidemia ([place]) 12/11/2018  .  Essential hypertension   . Gout   . History of inferior wall myocardial infarction 2008  . Hypercholesterolemia   . Hyperlipidemia   . Obesity   . Oral candidiasis   . Peripheral vascular disease ([place])   . Stroke [facility])    December 2019  . Type 2 diabetes mellitus ([place])     Past Surgical History:  Procedure Laterality Date  . AMPUTATION Right 01/12/2015   Procedure: PARTIAL AMPUTATION 1ST RAY RIGHT FOOT (Amputation of right great toe and portion of the first metatarsal bone right foot);  Surgeon: Caprice Beaver, DPM;  Location: [facility];  Service: Podiatry;  Laterality: Right;  . AMPUTATION Right 09/14/2016   Procedure: PARTIAL AMPUTATION RAY 2ND TOE RIGHT FOOT;  Surgeon: Caprice Beaver, DPM;  Location: [facility];  Service: Podiatry;  Laterality: Right;  . COLONOSCOPY  08/2008  . ESOPHAGOGASTRODUODENOSCOPY  08/2008  . LAMINOTOMY  2001, 2006     Right L4-5 interlaminar laminotomy with excision of herniated   disc with operating microscope.  Marland Kitchen NECK SURGERY     cervical disc  . ORIF WRIST FRACTURE Right   . PERIPHERAL VASCULAR CATHETERIZATION N/A 02/27/2015   Procedure: Abdominal Aortogram;  Surgeon: Elam Dutch, MD;  Location: [place] [facility];  Service: Cardiovascular;  Laterality: N/A;  . PERIPHERAL VASCULAR CATHETERIZATION Bilateral 02/27/2015   Procedure: Lower Extremity Angiography;  Surgeon: Elam Dutch, MD;  Location: [place] [facility];  Service: Cardiovascular;  Laterality: Bilateral;  . Right ankle fracture open reduction internal fixation.  2003  . WOUND DEBRIDEMENT Right 12/24/2014   Procedure: DEBRIDEMENT SOFT TISSUE AND BONE RIGHT FOOT;  Surgeon: Caprice Beaver, DPM;  Location: [facility];  Service: Podiatry;  Laterality: Right;  Current Outpatient Medications  Medication Sig Dispense Refill  . aspirin 325 MG tablet Take 325 mg by mouth every evening.     . B-D ULTRAFINE III SHORT PEN 31G X 8 MM MISC USE PEN NEEDLES DAILY WITH LANTUS 100 each 5  .  betamethasone valerate ointment (VALISONE) 0.1 % APPLY 1 APPLICATION TOPICALLY 2 (TWO) TIMES DAILY AS NEEDED. 45 g 0  . dapagliflozin propanediol (FARXIGA) 10 MG TABS tablet Take 10 mg by mouth daily. 90 tablet 1  . furosemide (LASIX) 20 MG tablet Take 1 tablet (20 mg total) by mouth in the morning. 30 tablet 5  . Insulin Detemir (LEVEMIR FLEXTOUCH) 100 UNIT/ML Pen 54 UNITS AT BEDTIME - MAY TITRATE UP TO 70 UNITS INTO SKIN AT BEDTIME FOR DIABETES 15 mL 1  . Multiple Vitamins-Minerals (MULTIVITAMIN WITH MINERALS) tablet Take 1 tablet by mouth daily.    . Omega-3 Fatty Acids (FISH OIL PO) Take by mouth. Takes 2 daily    . oxyCODONE-acetaminophen (PERCOCET) 10-325 MG tablet Take one tablet by mouth every 6 hours prn pain 120 tablet 0  . oxyCODONE-acetaminophen (PERCOCET) 10-325 MG tablet Take 1 tablet by mouth every 6 (six) hours as needed for pain. 120 tablet 0  . oxyCODONE-acetaminophen (PERCOCET) 10-325 MG tablet Take one tablet by mouth every 6 hours prn pain 120 tablet 0  . potassium chloride SA (KLOR-CON) 20 MEQ tablet Take 1 tablet (20 mEq total) by mouth daily. When taking Lasix 30 tablet 1   No current facility-administered medications for this visit.    Allergies as of 01/29/2020 - Review Complete 01/29/2020  Allergen Reaction Noted  . Metformin and related  02/16/2013  . Statins Other (See Comments) 08/26/2013  . Vicodin [hydrocodone-acetaminophen] Nausea Only 02/16/2013    Family History  Problem Relation Age of Onset  . Colon cancer Mother   . Diabetes Father   . Lung cancer Father   . Cancer Sister   . Diabetes Sister   . Heart disease Sister        before age 44    Social History   Socioeconomic History  . Marital status: Married    Spouse name: Not on file  . Number of children: Not on file  . Years of education: Not on file  . Highest education level: Not on file  Occupational History  . Not on file  Tobacco Use  . Smoking status: Former Smoker    Packs/day:  1.00    Years: 10.00    Pack years: 10.00    Types: Cigarettes    Quit date: 12/23/1970    Years since quitting: 49.1  . Smokeless tobacco: Never Used  . Tobacco comment: quit in 1979  Substance and Sexual Activity  . Alcohol use: No    Alcohol/week: 0.0 standard drinks  . Drug use: Not Currently    Types: Marijuana    Comment: in the past  . Sexual activity: Yes    Birth control/protection: None  Other Topics Concern  . Not on file  Social History Narrative    He is married and has 3 kids.  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  He denies any tobacco or alcohol use.    Social Determinants of Health   Financial Resource Strain:   . Difficulty of Paying Living Expenses:   Food Insecurity:   . Worried About Charity fundraiser in the Last Year:   . Arboriculturist in  the Last Year:   Transportation Needs:   . Film/video editor (Medical):   Marland Kitchen Lack of Transportation (Non-Medical):   Physical Activity:   . Days of Exercise per Week:   . Minutes of Exercise per Session:   Stress:   . Feeling of Stress :   Social Connections:   . Frequency of Communication with Friends and Family:   . Frequency of Social Gatherings with Friends and Family:   . Attends Religious Services:   . Active Member of Clubs or Organizations:   . Attends Archivist Meetings:   Marland Kitchen Marital Status:   Intimate Partner Violence:   . Fear of Current or Ex-Partner:   . Emotionally Abused:   Marland Kitchen Physically Abused:   . Sexually Abused:     Subjective: Review of Systems  Constitutional: Negative for chills, fever, malaise/fatigue and weight loss.  HENT: Negative for congestion and sore throat.   Respiratory: Negative for cough and shortness of breath.   Cardiovascular: Negative for chest pain and palpitations.  Gastrointestinal: Negative for abdominal pain, blood in stool, diarrhea, melena, nausea and vomiting.  Musculoskeletal: Negative for joint pain and myalgias.  Skin:  Positive for rash (bilateral palmer hand surfaces and plantar foot surfaces).  Neurological: Negative for dizziness and weakness.  Endo/Heme/Allergies: Does not bruise/bleed easily.  Psychiatric/Behavioral: Negative for depression. The patient is not nervous/anxious.   All other systems reviewed and are negative.      Objective: BP (!) 150/75   Pulse 80   Temp (!) 97.1 F (36.2 C) (Oral)   Ht _0  (1.905 m)   Wt 264 lb 9.6 oz (120 kg)   BMI 33.07 kg/m  Physical Exam Vitals and nursing note reviewed.  Constitutional:      General: He is not in acute distress.    Appearance: Normal appearance. He is obese. He is not ill-appearing, toxic-appearing or diaphoretic.  HENT:     Head: Normocephalic and atraumatic.     Nose: No congestion or rhinorrhea.  Eyes:     General: No scleral icterus. Cardiovascular:     Rate and Rhythm: Normal rate and regular rhythm.     Heart sounds: Normal heart sounds.  Pulmonary:     Effort: Pulmonary effort is normal.     Breath sounds: Normal breath sounds.  Abdominal:     General: Bowel sounds are normal. There is no distension.     Palpations: Abdomen is soft. There is no hepatomegaly, splenomegaly or mass.     Tenderness: There is no abdominal tenderness. There is no guarding or rebound.     Hernia: No hernia is present.  Musculoskeletal:     Cervical back: Neck supple.  Skin:    General: Skin is warm and dry.     Coloration: Skin is not jaundiced.     Findings: Rash present. No bruising.          Comments: Also bilateral plantar surfaces  Neurological:     General: No focal deficit present.     Mental Status: He is alert and oriented to person, place, and time. Mental status is at baseline.  Psychiatric:        Mood and Affect: Mood normal.        Behavior: Behavior normal.        Thought Content: Thought content normal.       01/29/2020 9:30 AM   Disclaimer: This note was dictated with voice recognition software. Similar  sounding words can inadvertently be  transcribed and may not be corrected upon review.

## 2020-01-29 NOTE — Assessment & Plan Note (Addendum)
Currently due for colonoscopy.  Generally asymptomatic from GI standpoint.  He will need medication adjustments due to diabetes medicines.  Will need MAC sedation due to chronic pain medicines.  We will proceed with scheduling at this time.  Discussed with him the nuances with the schedule over the summer in anticipation of the departure of Dr. Oneida Alar.  Dr. Gala Romney will perform in Dr. Oneida Alar absence.  Proceed with TCS on propofol/MAC with Dr. Gala Romney in near future: the risks, benefits, and alternatives have been discussed with the patient in detail. The patient states understanding and desires to proceed.  The patient is currently on Farxiga, Levemir, oxycodone. The patient is not on any other anticoagulants, anxiolytics, chronic pain medications, antidepressants, antidiabetics, or iron supplements.  We will plan for the procedure on propofol/MAC to promote adequate sedation.  ASA III/IV (history of CAD and stent >3 mo ago; history of CVA > 3 mo ago; EG 30-35%)

## 2020-02-05 ENCOUNTER — Telehealth: Payer: Self-pay | Admitting: *Deleted

## 2020-02-05 ENCOUNTER — Telehealth (INDEPENDENT_AMBULATORY_CARE_PROVIDER_SITE_OTHER): Payer: Medicare HMO | Admitting: Family Medicine

## 2020-02-05 ENCOUNTER — Encounter: Payer: Self-pay | Admitting: Family Medicine

## 2020-02-05 ENCOUNTER — Other Ambulatory Visit: Payer: Self-pay

## 2020-02-05 DIAGNOSIS — I739 Peripheral vascular disease, unspecified: Secondary | ICD-10-CM | POA: Diagnosis not present

## 2020-02-05 DIAGNOSIS — Z794 Long term (current) use of insulin: Secondary | ICD-10-CM | POA: Diagnosis not present

## 2020-02-05 DIAGNOSIS — G894 Chronic pain syndrome: Secondary | ICD-10-CM

## 2020-02-05 DIAGNOSIS — E1140 Type 2 diabetes mellitus with diabetic neuropathy, unspecified: Secondary | ICD-10-CM

## 2020-02-05 NOTE — Telephone Encounter (Signed)
Mr. shahmeer, veneziale are scheduled for a virtual visit with your provider today.    Just as we do with appointments in the office, we must obtain your consent to participate.  Your consent will be active for this visit and any virtual visit you may have with one of our providers in the next 365 days.    If you have a MyChart account, I can also send a copy of this consent to you electronically.  All virtual visits are billed to your insurance company just like a traditional visit in the office.  As this is a virtual visit, video technology does not allow for your provider to perform a traditional examination.  This may limit your provider's ability to fully assess your condition.  If your provider identifies any concerns that need to be evaluated in person or the need to arrange testing such as labs, EKG, etc, we will make arrangements to do so.    Although advances in technology are sophisticated, we cannot ensure that it will always work on either your end or our end.  If the connection with a video visit is poor, we may have to switch to a telephone visit.  With either a video or telephone visit, we are not always able to ensure that we have a secure connection.   I need to obtain your verbal consent now.   Are you willing to proceed with your visit today?   LAVI NITKA has provided verbal consent on 02/05/2020 for a virtual visit (video or telephone).   Mitzie Na, RN 02/05/2020  8:38 AM

## 2020-02-05 NOTE — Progress Notes (Signed)
   Subjective:    Patient ID: [name], male    DOB: November 07, 1955, 64 y.o.   MRN: [id number]  HPI  Patient calls for a follow up on rash and wound from recent biopsy. Patient states that the rash and the wound are much better and almost completely healed.  Virtual Visit via Video Note  I connected with [name] on 02/05/20 at  8:40 AM EDT by a video enabled telemedicine application and verified that I am speaking with the correct person using two identifiers.  Location: Patient: home Provider: office   I discussed the limitations of evaluation and management by telemedicine and the availability of in person appointments. The patient expressed understanding and agreed to proceed.  History of Present Illness:    Observations/Objective:   Assessment and Plan:   Follow Up Instructions:    I discussed the assessment and treatment plan with the patient. The patient was provided an opportunity to ask questions and all were answered. The patient agreed with the plan and demonstrated an understanding of the instructions.   The patient was advised to call back or seek an in-person evaluation if the symptoms worsen or if the condition fails to improve as anticipated.  I provided 20 minutes of non-face-to-face time during this encounter.    Patient does have peripheral vascular disease that made it harder for this biopsy to heal up   Review of Systems Denies drainage or soreness from his wound that is healing    Objective:   Physical Exam   Today's visit was via telephone Physical exam was not possible for this visit      Assessment & Plan:  The wound on the leg is actually improving no need for antibiotics or other intervention  Diabetes he states his readings seem to be doing all right taking his medication watching his diet  Chronic pain under good control patient does have refills follow-up early summer for in person visit

## 2020-02-19 ENCOUNTER — Other Ambulatory Visit: Payer: Self-pay | Admitting: Family Medicine

## 2020-03-13 ENCOUNTER — Other Ambulatory Visit (HOSPITAL_COMMUNITY): Payer: Medicare HMO

## 2020-03-17 ENCOUNTER — Other Ambulatory Visit (HOSPITAL_COMMUNITY)
Admission: RE | Admit: 2020-03-17 | Discharge: 2020-03-17 | Disposition: A | Discharge status: Home / Self Care | Payer: Medicare HMO | Source: Ambulatory Visit | Attending: Internal Medicine | Admitting: Internal Medicine

## 2020-03-17 ENCOUNTER — Other Ambulatory Visit: Payer: Self-pay

## 2020-03-17 DIAGNOSIS — I739 Peripheral vascular disease, unspecified: Secondary | ICD-10-CM | POA: Diagnosis not present

## 2020-03-17 DIAGNOSIS — Z20822 Contact with and (suspected) exposure to covid-19: Secondary | ICD-10-CM | POA: Insufficient documentation

## 2020-03-17 DIAGNOSIS — Z01812 Encounter for preprocedural laboratory examination: Secondary | ICD-10-CM | POA: Diagnosis not present

## 2020-03-17 DIAGNOSIS — L851 Acquired keratosis [keratoderma] palmaris et plantaris: Secondary | ICD-10-CM | POA: Diagnosis not present

## 2020-03-17 DIAGNOSIS — B351 Tinea unguium: Secondary | ICD-10-CM | POA: Diagnosis not present

## 2020-03-17 DIAGNOSIS — L97512 Non-pressure chronic ulcer of other part of right foot with fat layer exposed: Secondary | ICD-10-CM | POA: Diagnosis not present

## 2020-03-17 DIAGNOSIS — E1142 Type 2 diabetes mellitus with diabetic polyneuropathy: Secondary | ICD-10-CM | POA: Diagnosis not present

## 2020-03-17 LAB — SARS CORONAVIRUS 2 (TAT 6-24 HRS): SARS Coronavirus 2: NEGATIVE

## 2020-03-17 NOTE — Progress Notes (Signed)
Called patient to let him know he missed his appointment this morning. I asked him if he could be here before 2:30, "yes". Called ladies up front to let them know, nothing further needed.

## 2020-03-18 ENCOUNTER — Telehealth: Payer: Self-pay | Admitting: Internal Medicine

## 2020-03-18 ENCOUNTER — Ambulatory Visit (HOSPITAL_COMMUNITY): Admission: RE | Admit: 2020-03-18 | Payer: Medicare HMO | Source: Home / Self Care | Admitting: Internal Medicine

## 2020-03-18 ENCOUNTER — Other Ambulatory Visit: Payer: Self-pay | Admitting: *Deleted

## 2020-03-18 ENCOUNTER — Telehealth: Payer: Self-pay | Admitting: Family Medicine

## 2020-03-18 ENCOUNTER — Encounter (HOSPITAL_COMMUNITY): Admission: RE | Payer: Self-pay | Source: Home / Self Care

## 2020-03-18 ENCOUNTER — Telehealth: Payer: Self-pay

## 2020-03-18 DIAGNOSIS — E1140 Type 2 diabetes mellitus with diabetic neuropathy, unspecified: Secondary | ICD-10-CM

## 2020-03-18 DIAGNOSIS — R600 Localized edema: Secondary | ICD-10-CM

## 2020-03-18 DIAGNOSIS — R5383 Other fatigue: Secondary | ICD-10-CM

## 2020-03-18 DIAGNOSIS — E782 Mixed hyperlipidemia: Secondary | ICD-10-CM

## 2020-03-18 SURGERY — COLONOSCOPY
Anesthesia: Moderate Sedation

## 2020-03-18 NOTE — Telephone Encounter (Signed)
LMOVM

## 2020-03-18 NOTE — OR Nursing (Signed)
Patient was to be here at 0900 for procedure. Called patient and was hung up on. Called back and spoke with a lady who said, "can you call back, we are at a graduation"? Explained that we had patient to be here at 0900 for procedure. She said, "well, we are at a graduation."? Asked her if patient was coming for procedure today and she said no. Notified Tretha Sciara at the office of the above.

## 2020-03-18 NOTE — Telephone Encounter (Signed)
[place] RESCHEDULE TCS, WIFE STATED NO ONE TOLD HIM TO BE AT [facility] AND HE DID NOT RECEIVE HIS INFORMATION IN THE MAIL

## 2020-03-18 NOTE — Telephone Encounter (Signed)
Orders updated

## 2020-03-18 NOTE — Telephone Encounter (Signed)
FYI to EG.  See other phone note for today.

## 2020-03-18 NOTE — Telephone Encounter (Signed)
Patient  would like lab orders for [employer] changed to [employer] .

## 2020-03-18 NOTE — Telephone Encounter (Signed)
[name] at endo called office, pt was no show for TCS this morning. She tried to call him, spoke to wife. Wife told her to call back later because they are at a graduation.

## 2020-03-19 ENCOUNTER — Other Ambulatory Visit: Payer: Self-pay | Admitting: *Deleted

## 2020-03-19 DIAGNOSIS — E1140 Type 2 diabetes mellitus with diabetic neuropathy, unspecified: Secondary | ICD-10-CM

## 2020-03-19 DIAGNOSIS — E782 Mixed hyperlipidemia: Secondary | ICD-10-CM

## 2020-03-19 DIAGNOSIS — R5383 Other fatigue: Secondary | ICD-10-CM

## 2020-03-19 NOTE — Telephone Encounter (Signed)
lmovm

## 2020-03-20 DIAGNOSIS — E1140 Type 2 diabetes mellitus with diabetic neuropathy, unspecified: Secondary | ICD-10-CM | POA: Diagnosis not present

## 2020-03-20 DIAGNOSIS — E782 Mixed hyperlipidemia: Secondary | ICD-10-CM | POA: Diagnosis not present

## 2020-03-20 DIAGNOSIS — Z794 Long term (current) use of insulin: Secondary | ICD-10-CM | POA: Diagnosis not present

## 2020-03-20 DIAGNOSIS — R5383 Other fatigue: Secondary | ICD-10-CM | POA: Diagnosis not present

## 2020-03-21 LAB — HEMOGLOBIN A1C
Hgb A1c MFr Bld: 7.9 % of total Hgb — ABNORMAL HIGH (ref <5.7)
Mean Plasma Glucose: 180 (calc)
eAG (mmol/L): 10 (calc)

## 2020-03-21 LAB — BASIC METABOLIC PANEL
BUN: 19 mg/dL (ref 7–25)
CO2: 28 mmol/L (ref 20–32)
Calcium: 9.2 mg/dL (ref 8.6–10.3)
Chloride: 103 mmol/L (ref 98–110)
Creat: 0.95 mg/dL (ref 0.70–1.25)
Glucose, Bld: 122 mg/dL — ABNORMAL HIGH (ref 65–99)
Potassium: 4.3 mmol/L (ref 3.5–5.3)
Sodium: 139 mmol/L (ref 135–146)

## 2020-03-21 LAB — LIPID PANEL
Cholesterol: 203 mg/dL — ABNORMAL HIGH (ref <200)
HDL: 31 mg/dL — ABNORMAL LOW (ref >=40)
LDL Cholesterol (Calc): 146 mg/dL (calc) — ABNORMAL HIGH
Non-HDL Cholesterol (Calc): 172 mg/dL (calc) — ABNORMAL HIGH (ref <130)
Total CHOL/HDL Ratio: 6.5 (calc) — ABNORMAL HIGH (ref <5.0)
Triglycerides: 137 mg/dL (ref <150)

## 2020-04-03 DIAGNOSIS — L97512 Non-pressure chronic ulcer of other part of right foot with fat layer exposed: Secondary | ICD-10-CM | POA: Diagnosis not present

## 2020-04-03 DIAGNOSIS — E1142 Type 2 diabetes mellitus with diabetic polyneuropathy: Secondary | ICD-10-CM | POA: Diagnosis not present

## 2020-04-07 ENCOUNTER — Encounter: Payer: Self-pay | Admitting: Family Medicine

## 2020-04-07 ENCOUNTER — Ambulatory Visit (INDEPENDENT_AMBULATORY_CARE_PROVIDER_SITE_OTHER): Payer: Medicare HMO | Admitting: Family Medicine

## 2020-04-07 ENCOUNTER — Other Ambulatory Visit: Payer: Self-pay

## 2020-04-07 VITALS — BP 132/70 | Temp 97.3°F | Wt 263.8 lb

## 2020-04-07 DIAGNOSIS — Z23 Encounter for immunization: Secondary | ICD-10-CM

## 2020-04-07 DIAGNOSIS — G8929 Other chronic pain: Secondary | ICD-10-CM

## 2020-04-07 DIAGNOSIS — I739 Peripheral vascular disease, unspecified: Secondary | ICD-10-CM | POA: Diagnosis not present

## 2020-04-07 DIAGNOSIS — Z79891 Long term (current) use of opiate analgesic: Secondary | ICD-10-CM | POA: Diagnosis not present

## 2020-04-07 DIAGNOSIS — E782 Mixed hyperlipidemia: Secondary | ICD-10-CM

## 2020-04-07 DIAGNOSIS — E1142 Type 2 diabetes mellitus with diabetic polyneuropathy: Secondary | ICD-10-CM

## 2020-04-07 DIAGNOSIS — Z1211 Encounter for screening for malignant neoplasm of colon: Secondary | ICD-10-CM

## 2020-04-07 MED ORDER — OXYCODONE-ACETAMINOPHEN 10-325 MG PO TABS: ORAL_TABLET | ORAL | 0 refills | Status: DC

## 2020-04-07 MED ORDER — RYBELSUS 3 MG PO TABS: ORAL_TABLET | ORAL | 4 refills | Status: DC

## 2020-04-07 MED ORDER — SHINGRIX 50 MCG/0.5ML IM SUSR: 0.5000 mL | Freq: Once | INTRAMUSCULAR | 1 refills | Status: AC

## 2020-04-07 MED ORDER — OXYCODONE-ACETAMINOPHEN 10-325 MG PO TABS: 1.0000 | ORAL_TABLET | Freq: Four times a day (QID) | ORAL | 0 refills | Status: DC | PRN

## 2020-04-07 NOTE — Progress Notes (Signed)
Subjective:    Patient ID: [name], male    DOB: 07-Feb-1956, 64 y.o.   MRN: [id number]  HPI This patient was seen today for chronic pain  The medication list was reviewed and updated.   -Compliance with medication: Oxycodone 10-325 mg  - Number patient states they take daily:  3-4  -when was the last dose patient took? This morning about 4:30  The patient was advised the importance of maintaining medication and not using illegal substances with these.  Here for refills and follow up  The patient was educated that we can provide 3 monthly scripts for their medication, it is their responsibility to follow the instructions.  Side effects or complications from medications: none  Patient is aware that pain medications are meant to minimize the severity of the pain to allow their pain levels to improve to allow for better function. They are aware of that pain medications cannot totally remove their pain.  Due for UDT ( at least once per year) : will attempt today  Fall Risk  02/05/2020 01/08/2020 08/24/2017 07/07/2015  Falls in the past year? 0 1 No No  Number falls in past yr: - 0 - -  Injury with Fall? - 0 - -  Follow up Falls evaluation completed Education provided - -     Patient states the pain medicine does allow him to function better without it he did have a very difficult time   Review of Systems  Constitutional: Negative for diaphoresis and fatigue.  HENT: Negative for congestion and rhinorrhea.   Respiratory: Negative for cough and shortness of breath.   Cardiovascular: Negative for chest pain and leg swelling.  Gastrointestinal: Negative for abdominal pain and diarrhea.  Skin: Negative for color change and rash.  Neurological: Negative for dizziness and headaches.  Psychiatric/Behavioral: Negative for behavioral problems and confusion.       Objective:   Physical Exam Vitals reviewed.  Constitutional:      General: He is not in acute distress. HENT:      Head: Normocephalic and atraumatic.  Eyes:     General:        Right eye: No discharge.        Left eye: No discharge.  Neck:     Trachea: No tracheal deviation.  Cardiovascular:     Rate and Rhythm: Normal rate and regular rhythm.     Heart sounds: Normal heart sounds. No murmur heard.   Pulmonary:     Effort: Pulmonary effort is normal. No respiratory distress.     Breath sounds: Normal breath sounds.  Lymphadenopathy:     Cervical: No cervical adenopathy.  Skin:    General: Skin is warm and dry.  Neurological:     Mental Status: He is alert.     Coordination: Coordination normal.  Psychiatric:        Behavior: Behavior normal.           Assessment & Plan:  1. Diabetic peripheral neuropathy (HCC) Caution regarding how to prevent ulcers in the feet were discussed  2. Peripheral vascular disease (HCC) No signs of cyanosis but has diminished blood flow to the feet  3. Mixed hyperlipidemia Cholesterol subpar control but he was told by the dermatologist that the statins were causing his rash I have encouraged him to rethink this.  We will try low-dose pravastatin  4. Encounter for chronic pain management The patient was seen in followup for chronic pain. A review over at their current  pain status was discussed. Drug registry was checked. Prescriptions were given.  Regular follow-up recommended. Discussion was held regarding the importance of compliance with medication as well as pain medication contract.  Patient was informed that medication may cause drowsiness and should not be combined  with other medications/alcohol or street drugs. If the patient feels medication is causing altered alertness then do not drive or operate dangerous equipment.    5. Encounter for screening colonoscopy Referral - Ambulatory referral to Gastroenterology  6. Need for vaccination Today - Pneumococcal polysaccharide vaccine 23-valent greater than or equal to 2yo  subcutaneous/IM  7. Encounter for long-term opiate analgesic use Today - ToxASSURE Select 13 (MW), Urine  Diabetes subpar control Recommend switching to Rybelsus and stop [place].  Side effects were discussed.  Recheck again later this fall

## 2020-04-07 NOTE — Patient Instructions (Signed)
Stop [place] Call us if nausea issues

## 2020-04-10 LAB — TOXASSURE SELECT 13 (MW), URINE

## 2020-04-17 ENCOUNTER — Encounter: Payer: Self-pay | Admitting: Family Medicine

## 2020-04-21 NOTE — Telephone Encounter (Signed)
Please advise ASA type thanks

## 2020-04-22 NOTE — Telephone Encounter (Signed)
ASA III/IV

## 2020-04-27 ENCOUNTER — Telehealth: Payer: Self-pay | Admitting: *Deleted

## 2020-04-27 NOTE — Telephone Encounter (Signed)
LMOVM to schedule TCS with propofol with Dr. Abbey Chatters. ASA 3/4

## 2020-04-28 ENCOUNTER — Encounter: Payer: Self-pay | Admitting: *Deleted

## 2020-04-28 NOTE — Telephone Encounter (Signed)
PATIENT RETURNED CALL. He is scheduled for procedure 8/17 at 7:30am. Pt aware will need pre-op/covid test prior. Advised I will mail is prep instructions with this appt. Confirmed mailing address is correct. I advised pt if he does not receive this in the next week to call us to let us know. He voiced understanding.

## 2020-05-05 ENCOUNTER — Telehealth: Payer: Self-pay | Admitting: Internal Medicine

## 2020-05-05 NOTE — Telephone Encounter (Signed)
Please call patient , he has questions about his prep

## 2020-05-05 NOTE — Telephone Encounter (Signed)
Tried to call pt, no answer, LMOAM for return call. 

## 2020-05-18 ENCOUNTER — Telehealth: Payer: Self-pay | Admitting: Internal Medicine

## 2020-05-18 NOTE — Telephone Encounter (Signed)
Called patient and is aware he is doing miralax prep. Patient states he didn't want to drink any prep. I advised patient he has to drink prep to get cleaned out for procedure. Patient states he will discuss with spouse and call back

## 2020-05-18 NOTE — Telephone Encounter (Signed)
Pt is scheduled colonoscopy with [facility] on 8/17 and has questions about the type of prep he has. He doesn't want to drink a gallon of "chalky stuff" and wanted to know what changed? I told him that some preps were on back order and some preps have changed over the years. Please advise. [phone]

## 2020-05-26 DIAGNOSIS — L851 Acquired keratosis [keratoderma] palmaris et plantaris: Secondary | ICD-10-CM | POA: Diagnosis not present

## 2020-05-26 DIAGNOSIS — B351 Tinea unguium: Secondary | ICD-10-CM | POA: Diagnosis not present

## 2020-05-26 DIAGNOSIS — I739 Peripheral vascular disease, unspecified: Secondary | ICD-10-CM | POA: Diagnosis not present

## 2020-05-26 DIAGNOSIS — E1142 Type 2 diabetes mellitus with diabetic polyneuropathy: Secondary | ICD-10-CM | POA: Diagnosis not present

## 2020-05-29 ENCOUNTER — Other Ambulatory Visit: Payer: Self-pay

## 2020-05-29 ENCOUNTER — Encounter (HOSPITAL_COMMUNITY)
Admission: RE | Admit: 2020-05-29 | Discharge: 2020-05-29 | Disposition: A | Discharge status: Home / Self Care | Payer: Medicare HMO | Source: Ambulatory Visit | Attending: Internal Medicine | Admitting: Internal Medicine

## 2020-05-29 ENCOUNTER — Other Ambulatory Visit (HOSPITAL_COMMUNITY)
Admission: RE | Admit: 2020-05-29 | Discharge: 2020-05-29 | Disposition: A | Discharge status: Home / Self Care | Payer: Medicare HMO | Source: Ambulatory Visit | Attending: Internal Medicine | Admitting: Internal Medicine

## 2020-05-29 DIAGNOSIS — Z01812 Encounter for preprocedural laboratory examination: Secondary | ICD-10-CM | POA: Insufficient documentation

## 2020-05-29 DIAGNOSIS — Z20822 Contact with and (suspected) exposure to covid-19: Secondary | ICD-10-CM | POA: Diagnosis not present

## 2020-05-29 LAB — SARS CORONAVIRUS 2 (TAT 6-24 HRS): SARS Coronavirus 2: NEGATIVE

## 2020-05-29 NOTE — Patient Instructions (Signed)
[name]  05/29/2020     @PREFPERIOPPHARMACY @   Your procedure is scheduled on  06/02/2020  Report to [facility] at  [place].M.  Call this number if you have problems the morning of surgery:  [phone]   Remember:  Follow the diet instructions given to you by the office.                     Take these medicines the morning of surgery with A SIP OF WATER  Oxycodone(if needed). Take 27 units of insulin the night before your procedure. DO NOT take any medications for diabetes the morning of your procedure.    Do not wear jewelry, make-up or nail polish.  Do not wear lotions, powders, or perfume. Please wear deodorant and brush your teeth.  Do not shave 48 hours prior to surgery.  Men may shave face and neck.  Do not bring valuables to the hospital.  [facility] is not responsible for any belongings or valuables.  Contacts, dentures or bridgework may not be worn into surgery.  Leave your suitcase in the car.  After surgery it may be brought to your room.  For patients admitted to the hospital, discharge time will be determined by your treatment team.  Patients discharged the day of surgery will not be allowed to drive home.   Name and phone number of your driver:   family Special instructions:  DO NOT smoke the morning of your procedure.  Please read over the following fact sheets that you were given. Anesthesia Post-op Instructions and Care and Recovery After Surgery       Colonoscopy, Adult, Care After This sheet gives you information about how to care for yourself after your procedure. Your health care provider may also give you more specific instructions. If you have problems or questions, contact your health care provider. What can I expect after the procedure? After the procedure, it is common to have:  A small amount of blood in your stool for 24 hours after the procedure.  Some gas.  Mild cramping or bloating of your abdomen. Follow these  instructions at home: Eating and drinking   Drink enough fluid to keep your urine pale yellow.  Follow instructions from your health care provider about eating or drinking restrictions.  Resume your normal diet as instructed by your health care provider. Avoid heavy or fried foods that are hard to digest. Activity  Rest as told by your health care provider.  Avoid sitting for a long time without moving. Get up to take short walks every 1-2 hours. This is important to improve blood flow and breathing. Ask for help if you feel weak or unsteady.  Return to your normal activities as told by your health care provider. Ask your health care provider what activities are safe for you. Managing cramping and bloating   Try walking around when you have cramps or feel bloated.  Apply heat to your abdomen as told by your health care provider. Use the heat source that your health care provider recommends, such as a moist heat pack or a heating pad. ? Place a towel between your skin and the heat source. ? Leave the heat on for 20-30 minutes. ? Remove the heat if your skin turns bright red. This is especially important if you are unable to feel pain, heat, or cold. You may have a greater risk of getting burned. General instructions  For  the first 24 hours after the procedure: ? Do not drive or use machinery. ? Do not sign important documents. ? Do not drink alcohol. ? Do your regular daily activities at a slower pace than normal. ? Eat soft foods that are easy to digest.  Take over-the-counter and prescription medicines only as told by your health care provider.  Keep all follow-up visits as told by your health care provider. This is important. Contact a health care provider if:  You have blood in your stool 2-3 days after the procedure. Get help right away if you have:  More than a small spotting of blood in your stool.  Large blood clots in your stool.  Swelling of your  abdomen.  Nausea or vomiting.  A fever.  Increasing pain in your abdomen that is not relieved with medicine. Summary  After the procedure, it is common to have a small amount of blood in your stool. You may also have mild cramping and bloating of your abdomen.  For the first 24 hours after the procedure, do not drive or use machinery, sign important documents, or drink alcohol.  Get help right away if you have a lot of blood in your stool, nausea or vomiting, a fever, or increased pain in your abdomen. This information is not intended to replace advice given to you by your health care provider. Make sure you discuss any questions you have with your health care provider. Document Revised: 04/29/2019 Document Reviewed: 04/29/2019 Elsevier Patient Education  [place] After These instructions provide you with information about caring for yourself after your procedure. Your health care provider may also give you more specific instructions. Your treatment has been planned according to current medical practices, but problems sometimes occur. Call your health care provider if you have any problems or questions after your procedure. What can I expect after the procedure? After your procedure, you may:  Feel sleepy for several hours.  Feel clumsy and have poor balance for several hours.  Feel forgetful about what happened after the procedure.  Have poor judgment for several hours.  Feel nauseous or vomit.  Have a sore throat if you had a breathing tube during the procedure. Follow these instructions at home: For at least 24 hours after the procedure:      Have a responsible adult stay with you. It is important to have someone help care for you until you are awake and alert.  Rest as needed.  Do not: ? Participate in activities in which you could fall or become injured. ? Drive. ? Use heavy machinery. ? Drink alcohol. ? Take sleeping  pills or medicines that cause drowsiness. ? Make important decisions or sign legal documents. ? Take care of children on your own. Eating and drinking  Follow the diet that is recommended by your health care provider.  If you vomit, drink water, juice, or soup when you can drink without vomiting.  Make sure you have little or no nausea before eating solid foods. General instructions  Take over-the-counter and prescription medicines only as told by your health care provider.  If you have sleep apnea, surgery and certain medicines can increase your risk for breathing problems. Follow instructions from your health care provider about wearing your sleep device: ? Anytime you are sleeping, including during daytime naps. ? While taking prescription pain medicines, sleeping medicines, or medicines that make you drowsy.  If you smoke, do not smoke without supervision.  Keep all  follow-up visits as told by your health care provider. This is important. Contact a health care provider if:  You keep feeling nauseous or you keep vomiting.  You feel light-headed.  You develop a rash.  You have a fever. Get help right away if:  You have trouble breathing. Summary  For several hours after your procedure, you may feel sleepy and have poor judgment.  Have a responsible adult stay with you for at least 24 hours or until you are awake and alert. This information is not intended to replace advice given to you by your health care provider. Make sure you discuss any questions you have with your health care provider. Document Revised: 01/01/2018 Document Reviewed: 01/24/2016 Elsevier Patient Education  [place].

## 2020-06-01 ENCOUNTER — Telehealth: Payer: Self-pay

## 2020-06-01 NOTE — Telephone Encounter (Signed)
Spoke with pt. Pt is going to continue to follow his prep instructions and understands to push fluids as much as possible. Per pt he is drinking the 4 oz or more as directed after each dose of Miralax. Pt is currently drinking some warm coffee and broth and still hasn't had a BM. Pt will continue to follow prep instructions and will call back in the morning if prep has failed.

## 2020-06-01 NOTE — Telephone Encounter (Signed)
Routing to [employer] as well.

## 2020-06-01 NOTE — Telephone Encounter (Signed)
Noted. Sounds like he is following the pre instructions appropriately. He is about 1/2 way through his prep. Is he drinking an additional 4 oz of water after he drinks the MiraLAX each hour? In addition to completing the rest of the bowel prep. He should focus on increasing the amount of water/clear liquids he is drinking. Hopefully, this will help get his bowels moving. MiraLAX needs a good amount of liquid to work well.   If his bowels are not clear by morning, he will need to call us first thing.

## 2020-06-01 NOTE — Telephone Encounter (Signed)
Spoke with pt. Pt states he took 2 Dulcolax 5 mg at 10:00 AM and 2 Dulcolax 5 mg at 3:00 PM, pt has taken Miralax as directed at 12:00 PM,  Mix 5 teaspoons of Miralax in any 4-6 ounces of CLEAR LIQUIDS (Gatorade) every hour 3 times. Pt has two more doses to take, 1 at 4pm and 1 at 5 pm. Pt is drinking 4 oz of clear liquid 30 mins after each dose of Miralax. Pt is aware of the next steps for the prep as well if his stool isn't clear. Pt is hoping to have a BM soon. If pt doesn't have a BM after following all of the steps for the Miralax, pt will call back.

## 2020-06-01 NOTE — Telephone Encounter (Signed)
Pt is scheduled for procedure tomorrow 06/02/20. Pt has started Miralax and Dulcolax this morning, clear liquid diet Sunday and has had a BM yet. Pt says he usually has a BM every morning and he is concerned that he hasn't had one BM yet.

## 2020-06-02 ENCOUNTER — Other Ambulatory Visit: Payer: Self-pay

## 2020-06-02 ENCOUNTER — Encounter (HOSPITAL_COMMUNITY)
Admission: RE | Disposition: A | Discharge status: Home / Self Care | Payer: Self-pay | Source: Home / Self Care | Attending: Internal Medicine

## 2020-06-02 ENCOUNTER — Encounter (HOSPITAL_COMMUNITY): Payer: Self-pay | Admitting: *Deleted

## 2020-06-02 ENCOUNTER — Ambulatory Visit (HOSPITAL_COMMUNITY): Payer: Medicare HMO | Admitting: Anesthesiology

## 2020-06-02 ENCOUNTER — Ambulatory Visit (HOSPITAL_COMMUNITY)
Admission: RE | Admit: 2020-06-02 | Discharge: 2020-06-02 | Disposition: A | Discharge status: Home / Self Care | Payer: Medicare HMO | Attending: Internal Medicine | Admitting: Internal Medicine

## 2020-06-02 DIAGNOSIS — Z7982 Long term (current) use of aspirin: Secondary | ICD-10-CM | POA: Diagnosis not present

## 2020-06-02 DIAGNOSIS — K648 Other hemorrhoids: Secondary | ICD-10-CM | POA: Insufficient documentation

## 2020-06-02 DIAGNOSIS — E119 Type 2 diabetes mellitus without complications: Secondary | ICD-10-CM | POA: Diagnosis not present

## 2020-06-02 DIAGNOSIS — Z87891 Personal history of nicotine dependence: Secondary | ICD-10-CM | POA: Insufficient documentation

## 2020-06-02 DIAGNOSIS — K5730 Diverticulosis of large intestine without perforation or abscess without bleeding: Secondary | ICD-10-CM | POA: Insufficient documentation

## 2020-06-02 DIAGNOSIS — Z6835 Body mass index (BMI) 35.0-35.9, adult: Secondary | ICD-10-CM | POA: Diagnosis not present

## 2020-06-02 DIAGNOSIS — I429 Cardiomyopathy, unspecified: Secondary | ICD-10-CM | POA: Insufficient documentation

## 2020-06-02 DIAGNOSIS — Z8673 Personal history of transient ischemic attack (TIA), and cerebral infarction without residual deficits: Secondary | ICD-10-CM | POA: Diagnosis not present

## 2020-06-02 DIAGNOSIS — K635 Polyp of colon: Secondary | ICD-10-CM | POA: Insufficient documentation

## 2020-06-02 DIAGNOSIS — E669 Obesity, unspecified: Secondary | ICD-10-CM | POA: Diagnosis not present

## 2020-06-02 DIAGNOSIS — Z1211 Encounter for screening for malignant neoplasm of colon: Secondary | ICD-10-CM

## 2020-06-02 DIAGNOSIS — I2510 Atherosclerotic heart disease of native coronary artery without angina pectoris: Secondary | ICD-10-CM | POA: Insufficient documentation

## 2020-06-02 DIAGNOSIS — D123 Benign neoplasm of transverse colon: Secondary | ICD-10-CM | POA: Insufficient documentation

## 2020-06-02 DIAGNOSIS — I10 Essential (primary) hypertension: Secondary | ICD-10-CM | POA: Insufficient documentation

## 2020-06-02 DIAGNOSIS — Z79899 Other long term (current) drug therapy: Secondary | ICD-10-CM | POA: Diagnosis not present

## 2020-06-02 DIAGNOSIS — Z794 Long term (current) use of insulin: Secondary | ICD-10-CM | POA: Diagnosis not present

## 2020-06-02 DIAGNOSIS — E1151 Type 2 diabetes mellitus with diabetic peripheral angiopathy without gangrene: Secondary | ICD-10-CM | POA: Insufficient documentation

## 2020-06-02 HISTORY — PX: COLONOSCOPY WITH PROPOFOL: SHX5780

## 2020-06-02 HISTORY — PX: POLYPECTOMY: SHX5525

## 2020-06-02 LAB — GLUCOSE, CAPILLARY
Glucose-Capillary: 115 mg/dL — ABNORMAL HIGH (ref 70–99)
Glucose-Capillary: 113 mg/dL — ABNORMAL HIGH (ref 70–99)

## 2020-06-02 SURGERY — COLONOSCOPY WITH PROPOFOL
Anesthesia: General

## 2020-06-02 MED ORDER — PROPOFOL 10 MG/ML IV BOLUS
INTRAVENOUS | Status: AC
  Filled 2020-06-02: qty 160

## 2020-06-02 MED ORDER — LACTATED RINGERS IV SOLN
Freq: Once | INTRAVENOUS | Status: AC
  Administered 2020-06-02: 1000 mL via INTRAVENOUS

## 2020-06-02 MED ORDER — CHLORHEXIDINE GLUCONATE CLOTH 2 % EX PADS: 6.0000 | MEDICATED_PAD | Freq: Once | CUTANEOUS | Status: DC

## 2020-06-02 MED ORDER — LIDOCAINE HCL (CARDIAC) PF 50 MG/5ML IV SOSY
PREFILLED_SYRINGE | INTRAVENOUS | Status: DC | PRN
  Administered 2020-06-02: 100 mg via INTRAVENOUS

## 2020-06-02 MED ORDER — LACTATED RINGERS IV SOLN: INTRAVENOUS | Status: DC | PRN

## 2020-06-02 MED ORDER — PHENYLEPHRINE HCL (PRESSORS) 10 MG/ML IV SOLN
INTRAVENOUS | Status: DC | PRN
  Administered 2020-06-02: 80 ug via INTRAVENOUS

## 2020-06-02 MED ORDER — STERILE WATER FOR IRRIGATION IR SOLN
Status: DC | PRN
  Administered 2020-06-02: 1.5 mL

## 2020-06-02 MED ORDER — PHENYLEPHRINE 40 MCG/ML (10ML) SYRINGE FOR IV PUSH (FOR BLOOD PRESSURE SUPPORT)
PREFILLED_SYRINGE | INTRAVENOUS | Status: AC
  Filled 2020-06-02: qty 10

## 2020-06-02 MED ORDER — PROPOFOL 500 MG/50ML IV EMUL
INTRAVENOUS | Status: DC | PRN
  Administered 2020-06-02: 100 mg via INTRAVENOUS
  Administered 2020-06-02: 150 ug/kg/min via INTRAVENOUS

## 2020-06-02 MED ORDER — LIDOCAINE 2% (20 MG/ML) 5 ML SYRINGE
INTRAMUSCULAR | Status: AC
  Filled 2020-06-02: qty 5

## 2020-06-02 NOTE — H&P (Signed)
Primary Care Physician:  Kathyrn Drown, MD Primary Gastroenterologist:  Dr.   Pre-Procedure History & Physical: HPI:  [name] is a 64 y.o. male is here for a screening colonoscopy.   Past Medical History:  Diagnosis Date  . CAD (coronary artery disease)    BMS to RCA 2008  . Cardiomyopathy ([place])   . Depression   . DJD (degenerative joint disease)   . DM type 2 with diabetic mixed hyperlipidemia ([place]) 12/11/2018  . Essential hypertension   . Gout   . History of inferior wall myocardial infarction 2008  . Hypercholesterolemia   . Hyperlipidemia   . Obesity   . Oral candidiasis   . Peripheral vascular disease ([place])   . Stroke [facility])    December 2019  . Type 2 diabetes mellitus ([place])     Past Surgical History:  Procedure Laterality Date  . AMPUTATION Right 01/12/2015   Procedure: PARTIAL AMPUTATION 1ST RAY RIGHT FOOT (Amputation of right great toe and portion of the first metatarsal bone right foot);  Surgeon: Caprice Beaver, DPM;  Location: [facility];  Service: Podiatry;  Laterality: Right;  . AMPUTATION Right 09/14/2016   Procedure: PARTIAL AMPUTATION RAY 2ND TOE RIGHT FOOT;  Surgeon: Caprice Beaver, DPM;  Location: [facility];  Service: Podiatry;  Laterality: Right;  . COLONOSCOPY  08/2008  . ESOPHAGOGASTRODUODENOSCOPY  08/2008  . LAMINOTOMY  2001, 2006     Right L4-5 interlaminar laminotomy with excision of herniated   disc with operating microscope.  Marland Kitchen NECK SURGERY     cervical disc  . ORIF WRIST FRACTURE Right   . PERIPHERAL VASCULAR CATHETERIZATION N/A 02/27/2015   Procedure: Abdominal Aortogram;  Surgeon: Elam Dutch, MD;  Location: [place] CV [facility];  Service: Cardiovascular;  Laterality: N/A;  . PERIPHERAL VASCULAR CATHETERIZATION Bilateral 02/27/2015   Procedure: Lower Extremity Angiography;  Surgeon: Elam Dutch, MD;  Location: [place] [facility];  Service: Cardiovascular;  Laterality: Bilateral;  . Right ankle fracture open reduction internal  fixation.  2003  . WOUND DEBRIDEMENT Right 12/24/2014   Procedure: DEBRIDEMENT SOFT TISSUE AND BONE RIGHT FOOT;  Surgeon: Caprice Beaver, DPM;  Location: [facility];  Service: Podiatry;  Laterality: Right;    Prior to Admission medications   Medication Sig Start Date End Date Taking? Authorizing Provider  aspirin 325 MG tablet Take 325 mg by mouth every evening.    Yes [provider]  betamethasone valerate ointment (VALISONE) 0.1 % APPLY 1 APPLICATION TOPICALLY 2 (TWO) TIMES DAILY AS NEEDED. Patient taking differently: Apply 1 application topically 2 (two) times daily as needed (Psoriasis).  11/16/19  Yes Kathyrn Drown, MD  furosemide (LASIX) 20 MG tablet Take 1 tablet (20 mg total) by mouth in the morning. Patient taking differently: Take 20 mg by mouth daily as needed for fluid.  01/08/20  Yes Luking, Elayne Snare, MD  Insulin Detemir (LEVEMIR FLEXTOUCH) 100 UNIT/ML Pen 54 UNITS AT BEDTIME - MAY TITRATE UP TO 70 UNITS INTO SKIN AT BEDTIME FOR DIABETES Patient taking differently: Inject 54 Units into the skin at bedtime. MAY TITRATE UP TO 70 UNITS INTO SKIN AT BEDTIME FOR DIABETES as needed 11/11/19  Yes Luking, Scott A, MD  KLOR-CON M20 20 MEQ tablet TAKE 1 TABLET (20 MEQ TOTAL) BY MOUTH DAILY. WHEN TAKING LASIX Patient taking differently: Take 20 mEq by mouth daily as needed (With Furosemide).  02/19/20  Yes Luking, Elayne Snare, MD  lisinopril-hydrochlorothiazide (ZESTORETIC) 20-25 MG tablet Take 1 tablet by mouth  daily. 12/30/19  Yes [provider]  Multiple Vitamins-Minerals (MULTIVITAMIN WITH MINERALS) tablet Take 1 tablet by mouth daily.   Yes [provider]  Omega-3 Fatty Acids (FISH OIL PO) Take 1 tablet by mouth 3 (three) times daily.    Yes [provider]  oxyCODONE-acetaminophen (PERCOCET) 10-325 MG tablet Take one tablet by mouth every 6 hours prn pain 04/07/20  Yes Luking, Elayne Snare, MD  Semaglutide (RYBELSUS) 3 MG TABS Take one tablet po q day Patient taking  differently: Take 3 mg by mouth daily.  04/07/20  Yes Luking, Elayne Snare, MD  B-D ULTRAFINE III SHORT PEN 31G X 8 MM MISC USE PEN NEEDLES DAILY WITH LANTUS 11/04/15   Kathyrn Drown, MD  oxyCODONE-acetaminophen (PERCOCET) 10-325 MG tablet Take one tablet by mouth every 6 hours prn pain Patient taking differently: Take 1 tablet by mouth every 6 (six) hours as needed. Take one tablet by mouth every 6 hours prn pain 04/07/20   Kathyrn Drown, MD  oxyCODONE-acetaminophen (PERCOCET) 10-325 MG tablet Take 1 tablet by mouth every 6 (six) hours as needed for pain. Patient not taking: Reported on 05/18/2020 04/07/20   Kathyrn Drown, MD    Allergies as of 04/28/2020 - Review Complete 04/07/2020  Allergen Reaction Noted  . Metformin and related  02/16/2013  . Statins Other (See Comments) 08/26/2013  . Vicodin [hydrocodone-acetaminophen] Nausea Only 02/16/2013    Family History  Problem Relation Age of Onset  . Colon cancer Mother   . Diabetes Father   . Lung cancer Father   . Cancer Sister   . Diabetes Sister   . Heart disease Sister        before age 3    Social History   Socioeconomic History  . Marital status: Married    Spouse name: Not on file  . Number of children: Not on file  . Years of education: Not on file  . Highest education level: Not on file  Occupational History  . Not on file  Tobacco Use  . Smoking status: Former Smoker    Packs/day: 1.00    Years: 10.00    Pack years: 10.00    Types: Cigarettes    Quit date: 12/23/1970    Years since quitting: 49.4  . Smokeless tobacco: Never Used  . Tobacco comment: quit in 1979  Vaping Use  . Vaping Use: Never used  Substance and Sexual Activity  . Alcohol use: No    Alcohol/week: 0.0 standard drinks  . Drug use: Not Currently    Types: Marijuana    Comment: in the past  . Sexual activity: Yes    Birth control/protection: None  Other Topics Concern  . Not on file  Social History Narrative    He is married and has 3 kids.   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  He denies any tobacco or alcohol use.    Social Determinants of Health   Financial Resource Strain:   . Difficulty of Paying Living Expenses:   Food Insecurity:   . Worried About Charity fundraiser in the Last Year:   . Arboriculturist in the Last Year:   Transportation Needs:   . Film/video editor (Medical):   Marland Kitchen Lack of Transportation (Non-Medical):   Physical Activity:   . Days of Exercise per Week:   . Minutes of Exercise per Session:   Stress:   . Feeling of  Stress :   Social Connections:   . Frequency of Communication with Friends and Family:   . Frequency of Social Gatherings with Friends and Family:   . Attends Religious Services:   . Active Member of Clubs or Organizations:   . Attends Archivist Meetings:   Marland Kitchen Marital Status:   Intimate Partner Violence:   . Fear of Current or Ex-Partner:   . Emotionally Abused:   Marland Kitchen Physically Abused:   . Sexually Abused:     Review of Systems: See HPI, otherwise negative ROS  Impression/Plan: [name] is here for a colonoscopy to be performed for screening  Risks, benefits, limitations, imponderables and alternatives regarding colonoscopy have been reviewed with the patient. Questions have been answered. All parties agreeable.

## 2020-06-02 NOTE — Op Note (Signed)
[facility] Patient Name: [name] Procedure Date: 06/02/2020 7:13 AM MRN: [id number] Date of Birth: April 15, 1956 Attending MD: Elon Alas. Abbey Chatters DO CSN: [id number] Age: 64 Admit Type: Outpatient Procedure:                Colonoscopy Indications:              Screening for colorectal malignant neoplasm Providers:                Elon Alas. Shevette Bess, DO, [place]is [place] B. Sharon Seller, RN,                            Crystal Page, Caprice Kluver, Nelma Rothman, Technician Referring MD:              Medicines:                See the Anesthesia note for documentation of the                            administered medications Complications:            No immediate complications. Estimated Blood Loss:     Estimated blood loss was minimal. Procedure:                Pre-Anesthesia Assessment:                           - The anesthesia plan was to use monitored                            anesthesia care (MAC).                           After obtaining informed consent, the colonoscope                            was passed under direct vision. Throughout the                            procedure, the patient's blood pressure, pulse, and                            oxygen saturations were monitored continuously. The                            [id number]-[id number] ([id number]) scope was introduced through                            the anus and advanced to the the cecum, identified                            by appendiceal orifice and ileocecal valve. The                            colonoscopy was performed without difficulty. The  patient tolerated the procedure well. The quality                            of the bowel preparation was evaluated using the                            BBPS [facility] Bowel Preparation Scale) with scores                            of: Right Colon = 2 (minor amount of residual                            staining, small fragments of stool and/or opaque                             liquid, but mucosa seen well), Transverse Colon = 2                            (minor amount of residual staining, small fragments                            of stool and/or opaque liquid, but mucosa seen                            well) and Left Colon = 2 (minor amount of residual                            staining, small fragments of stool and/or opaque                            liquid, but mucosa seen well). The total BBPS score                            equals 6. The quality of the bowel preparation was                            fair. Scope In: 7:34:08 AM Scope Out: 7:55:43 AM Scope Withdrawal Time: 0 hours 17 minutes 34 seconds  Total Procedure Duration: 0 hours 21 minutes 35 seconds  Findings:      The perianal and digital rectal examinations were normal.      Non-bleeding internal hemorrhoids were found during endoscopy.      Multiple small-mouthed diverticula were found in the sigmoid colon and       transverse colon.      A 6 mm polyp was found in the transverse colon. The polyp was sessile.       The polyp was removed with a cold snare. Resection and retrieval were       complete.      A 4 mm polyp was found in the descending colon. The polyp was sessile.       The polyp was removed with a cold snare. Resection and retrieval were       complete.      The exam was otherwise without abnormality. Impression:               -  Preparation of the colon was fair.                           - Non-bleeding internal hemorrhoids.                           - Diverticulosis in the sigmoid colon and in the                            transverse colon.                           - One 6 mm polyp in the transverse colon, removed                            with a cold snare. Resected and retrieved.                           - One 4 mm polyp in the descending colon, removed                            with a cold snare. Resected and retrieved.                           - The examination  was otherwise normal. Moderate Sedation:      Per Anesthesia Care Recommendation:           - Patient has a contact number available for                            emergencies. The signs and symptoms of potential                            delayed complications were discussed with the                            patient. Return to normal activities tomorrow.                            Written discharge instructions were provided to the                            patient.                           - Resume previous diet.                           - Continue present medications.                           - Await pathology results.                           - Repeat colonoscopy in 5 years for surveillance.                           -  Return to GI clinic PRN. Procedure Code(s):        --- Professional ---                           [phone], Colonoscopy, flexible; with removal of                            tumor(s), polyp(s), or other lesion(s) by snare                            technique Diagnosis Code(s):        --- Professional ---                           Z12.11, Encounter for screening for malignant                            neoplasm of colon                           K64.8, Other hemorrhoids                           K63.5, Polyp of colon                           K57.30, Diverticulosis of large intestine without                            perforation or abscess without bleeding CPT copyright 2019 American Medical Association. All rights reserved. The codes documented in this report are preliminary and upon coder review may  be revised to meet current compliance requirements. Elon Alas. Abbey Chatters, [place] Abbey Chatters, DO 06/02/2020 7:58:41 AM This report has been signed electronically. Number of Addenda: 0

## 2020-06-02 NOTE — Anesthesia Preprocedure Evaluation (Addendum)
Anesthesia Evaluation  Patient identified by MRN, date of birth, ID band Patient awake    Reviewed: Allergy & Precautions, NPO status , Patient's Chart, lab work & pertinent test results  History of Anesthesia Complications Negative for: history of anesthetic complications  Airway Mallampati: II  TM Distance: >3 FB Neck ROM: Full    Dental  (+) Dental Advisory Given, Chipped, Poor Dentition, Missing   Pulmonary former smoker,    Pulmonary exam normal breath sounds clear to auscultation       Cardiovascular Exercise Tolerance: Good METS: 3 - Mets hypertension, Pt. on medications + CAD, + Past MI, + Cardiac Stents and + Peripheral Vascular Disease  Normal cardiovascular exam Rhythm:Regular Rate:Normal  Left ventricle: The cavity size was normal. Wall thickness was  increased in a pattern of mild LVH. Systolic function was  moderately to severely reduced. The estimated ejection fraction  was in the range of 30% to 35%. Diffuse hypokinesis. There is  akinesis of the basalinferior myocardium. There is swirling and  slow flow at the LV apex with Definity contrast, but no formed  thrombus. Features are consistent with a pseudonormal left  ventricular filling pattern, with concomitant abnormal relaxation  and increased filling pressure (grade 2 diastolic dysfunction).  - Aortic valve: Trileaflet; mildly calcified leaflets.  - Mitral valve: Mildly calcified annulus. There was trivial  regurgitation.  - Left atrium: The atrium was moderately dilated.  - Right atrium: Central venous pressure (est): 3 mm Hg.  - Atrial septum: No defect or patent foramen ovale was identified.  - Tricuspid valve: There was trivial regurgitation.  - Pericardium, extracardiac: A prominent pericardial fat pad was  present.    Neuro/Psych PSYCHIATRIC DISORDERS Depression  Neuromuscular disease CVA, Residual Symptoms     GI/Hepatic negative GI ROS, Neg liver ROS,   Endo/Other  diabetes, Well Controlled, Type 2, Insulin Dependent  Renal/GU negative Renal ROS     Musculoskeletal  (+) Arthritis , Neck fx   Abdominal   Peds  Hematology negative hematology ROS (+)   Anesthesia Other Findings   Reproductive/Obstetrics negative OB ROS                           Anesthesia Physical Anesthesia Plan  ASA: III  Anesthesia Plan: General   Post-op Pain Management:    Induction: Intravenous  PONV Risk Score and Plan: TIVA  Airway Management Planned: Nasal Cannula, Natural Airway and Simple Face Mask  Additional Equipment:   Intra-op Plan:   Post-operative Plan:   Informed Consent: I have reviewed the patients History and Physical, chart, labs and discussed the procedure including the risks, benefits and alternatives for the proposed anesthesia with the patient or authorized representative who has indicated his/her understanding and acceptance.     Dental advisory given  Plan Discussed with: CRNA and Surgeon  Anesthesia Plan Comments:       Anesthesia Quick Evaluation

## 2020-06-02 NOTE — Transfer of Care (Signed)
Immediate Anesthesia Transfer of Care Note  Patient: [name]  Procedure(s) Performed: COLONOSCOPY WITH PROPOFOL (N/A ) POLYPECTOMY  Patient Location: PACU  Anesthesia Type:General  Level of Consciousness: awake, alert , oriented and patient cooperative  Airway & Oxygen Therapy: Patient Spontanous Breathing and Patient connected to nasal cannula oxygen  Post-op Assessment: Report given to RN, Post -op Vital signs reviewed and stable and Patient moving all extremities  Post vital signs: Reviewed and stable  Last Vitals:  Vitals Value Taken Time  BP 99/60 06/02/20 0758  Temp    Pulse 66 06/02/20 0759  Resp 17 06/02/20 0759  SpO2 98 % 06/02/20 0759  Vitals shown include unvalidated device data.  Last Pain:  Vitals:   06/02/20 0731  PainSc: 0-No pain      Patients Stated Pain Goal: 6 (71/16/57 9038)  Complications: No complications documented.

## 2020-06-02 NOTE — Discharge Instructions (Addendum)
Colonoscopy Discharge Instructions  Read the instructions outlined below and refer to this sheet in the next few weeks. These discharge instructions provide you with general information on caring for yourself after you leave the hospital. Your doctor may also give you specific instructions. While your treatment has been planned according to the most current medical practices available, unavoidable complications occasionally occur.   ACTIVITY  You may resume your regular activity, but move at a slower pace for the next 24 hours.   Take frequent rest periods for the next 24 hours.   Walking will help get rid of the air and reduce the bloated feeling in your belly (abdomen).   No driving for 24 hours (because of the medicine (anesthesia) used during the test).    Do not sign any important legal documents or operate any machinery for 24 hours (because of the anesthesia used during the test).  NUTRITION  Drink plenty of fluids.   You may resume your normal diet as instructed by your doctor.   Begin with a light meal and progress to your normal diet. Heavy or fried foods are harder to digest and may make you feel sick to your stomach (nauseated).   Avoid alcoholic beverages for 24 hours or as instructed.  MEDICATIONS  You may resume your normal medications unless your doctor tells you otherwise.  WHAT YOU CAN EXPECT TODAY  Some feelings of bloating in the abdomen.   Passage of more gas than usual.   Spotting of blood in your stool or on the toilet paper.  IF YOU HAD POLYPS REMOVED DURING THE COLONOSCOPY:  No aspirin products for 7 days or as instructed.   No alcohol for 7 days or as instructed.   Eat a soft diet for the next 24 hours.  FINDING OUT THE RESULTS OF YOUR TEST Not all test results are available during your visit. If your test results are not back during the visit, make an appointment with your caregiver to find out the results. Do not assume everything is normal if  you have not heard from your caregiver or the medical facility. It is important for you to follow up on all of your test results.  SEEK IMMEDIATE MEDICAL ATTENTION IF:  You have more than a spotting of blood in your stool.   Your belly is swollen (abdominal distention).   You are nauseated or vomiting.   You have a temperature over 101.   You have abdominal pain or discomfort that is severe or gets worse throughout the day.   You had 2 polyps seen on colonoscopy which were successfully removed. Await pathology results (we will contact you). Repeat colonoscopy for surveillance in 5 years. Follow up with GI as needed.   I hope you have a great rest of your week!   Colon Polyps  Polyps are tissue growths inside the body. Polyps can grow in many places, including the large intestine (colon). A polyp may be a round bump or a mushroom-shaped growth. You could have one polyp or several. Most colon polyps are noncancerous (benign). However, some colon polyps can become cancerous over time. Finding and removing the polyps early can help prevent this. What are the causes? The exact cause of colon polyps is not known. What increases the risk? You are more likely to develop this condition if you:  Have a family history of colon cancer or colon polyps.  Are older than 27 or older than 45 if you are African American.  Have  inflammatory bowel disease, such as ulcerative colitis or Crohn's disease.  Have certain hereditary conditions, such as: ? Familial adenomatous polyposis. ? Lynch syndrome. ? Turcot syndrome. ? Peutz-Jeghers syndrome.  Are overweight.  Smoke cigarettes.  Do not get enough exercise.  Drink too much alcohol.  Eat a diet that is high in fat and red meat and low in fiber.  Had childhood cancer that was treated with abdominal radiation. What are the signs or symptoms? Most polyps do not cause symptoms. If you have symptoms, they may include:  Blood coming from  your rectum when having a bowel movement.  Blood in your stool. The stool may look dark red or black.  Abdominal pain.  A change in bowel habits, such as constipation or diarrhea. How is this diagnosed? This condition is diagnosed with a colonoscopy. This is a procedure in which a lighted, flexible scope is inserted into the anus and then passed into the colon to examine the area. Polyps are sometimes found when a colonoscopy is done as part of routine cancer screening tests. How is this treated? Treatment for this condition involves removing any polyps that are found. Most polyps can be removed during a colonoscopy. Those polyps will then be tested for cancer. Additional treatment may be needed depending on the results of testing. Follow these instructions at home: Lifestyle  Maintain a healthy weight, or lose weight if recommended by your health care provider.  Exercise every day or as told by your health care provider.  Do not use any products that contain nicotine or tobacco, such as cigarettes and e-cigarettes. If you need help quitting, ask your health care provider.  If you drink alcohol, limit how much you have: ? 0-1 drink a day for women. ? 0-2 drinks a day for men.  Be aware of how much alcohol is in your drink. In the [place] one drink equals one 12 oz bottle of beer (355 mL), one 5 oz glass of wine (148 mL), or one 1 oz shot of hard liquor (44 mL). Eating and drinking   Eat foods that are high in fiber, such as fruits, vegetables, and whole grains.  Eat foods that are high in calcium and vitamin D, such as milk, cheese, yogurt, eggs, liver, fish, and broccoli.  Limit foods that are high in fat, such as fried foods and desserts.  Limit the amount of red meat and processed meat you eat, such as hot dogs, sausage, bacon, and lunch meats. General instructions  Keep all follow-up visits as told by your health care provider. This is important. ? This includes having  regularly scheduled colonoscopies. ? Talk to your health care provider about when you need a colonoscopy. Contact a health care provider if:  You have new or worsening bleeding during a bowel movement.  You have new or increased blood in your stool.  You have a change in bowel habits.  You lose weight for no known reason. Summary  Polyps are tissue growths inside the body. Polyps can grow in many places, including the colon.  Most colon polyps are noncancerous (benign), but some can become cancerous over time.  This condition is diagnosed with a colonoscopy.  Treatment for this condition involves removing any polyps that are found. Most polyps can be removed during a colonoscopy. This information is not intended to replace advice given to you by your health care provider. Make sure you discuss any questions you have with your health care provider. Document Revised: 01/18/2018 Document  Reviewed: 01/18/2018 Elsevier Patient Education  [employer].

## 2020-06-02 NOTE — Anesthesia Postprocedure Evaluation (Signed)
Anesthesia Post Note  Patient: [name]  Procedure(s) Performed: COLONOSCOPY WITH PROPOFOL (N/A ) POLYPECTOMY  Patient location during evaluation: PACU Anesthesia Type: General Level of consciousness: awake, oriented, patient cooperative and awake and alert Pain management: pain level controlled Vital Signs Assessment: post-procedure vital signs reviewed and stable Respiratory status: spontaneous breathing, respiratory function stable, nonlabored ventilation and patient connected to nasal cannula oxygen Cardiovascular status: blood pressure returned to baseline and stable Postop Assessment: no headache and no backache Anesthetic complications: no   No complications documented.   Last Vitals: There were no vitals filed for this visit.  Last Pain:  Vitals:   06/02/20 0731  PainSc: 0-No pain                 Tacy Learn

## 2020-06-03 LAB — SURGICAL PATHOLOGY

## 2020-06-04 ENCOUNTER — Encounter (HOSPITAL_COMMUNITY): Payer: Self-pay | Admitting: Internal Medicine

## 2020-06-10 ENCOUNTER — Telehealth: Payer: Self-pay | Admitting: Family Medicine

## 2020-06-10 NOTE — Telephone Encounter (Signed)
Pt fell out of the bed on Monday, fell when trying to get up  Since then pain in right knee with swelling, difficult to walk, in pain   Wants to be seen, please advise on when to see patient as only "sameday" appointments available until 8/31 with Santiago Glad & 9/10 with Hoyle Sauer & 9/27 with Dr. Nicki Reaper

## 2020-06-10 NOTE — Telephone Encounter (Signed)
Called pt and discussed we are full today and tomorrow and advised urgent care. Pt states he does not want to go to urgent care and wants to wait til the next available appt. Pt sent to front to schedule appt.

## 2020-06-12 ENCOUNTER — Telehealth: Payer: Self-pay | Admitting: Family Medicine

## 2020-06-12 NOTE — Telephone Encounter (Signed)
Message not seen until around 5, patient notified it is too late and to keep appointment.

## 2020-06-12 NOTE — Telephone Encounter (Signed)
Patient is requesting to get xray of his knee before his appointment on 8/30 at 8:20 so you will have it at his appointment. Please advise

## 2020-06-15 ENCOUNTER — Other Ambulatory Visit: Payer: Self-pay

## 2020-06-15 ENCOUNTER — Ambulatory Visit (INDEPENDENT_AMBULATORY_CARE_PROVIDER_SITE_OTHER): Payer: Medicare HMO | Admitting: Family Medicine

## 2020-06-15 ENCOUNTER — Encounter: Payer: Self-pay | Admitting: Family Medicine

## 2020-06-15 ENCOUNTER — Ambulatory Visit (HOSPITAL_COMMUNITY)
Admission: RE | Admit: 2020-06-15 | Discharge: 2020-06-15 | Disposition: A | Discharge status: Home / Self Care | Payer: Medicare HMO | Source: Ambulatory Visit | Attending: Family Medicine | Admitting: Family Medicine

## 2020-06-15 VITALS — BP 128/80 | Temp 97.6°F | Wt 256.4 lb

## 2020-06-15 DIAGNOSIS — M25461 Effusion, right knee: Secondary | ICD-10-CM | POA: Insufficient documentation

## 2020-06-15 DIAGNOSIS — M25561 Pain in right knee: Secondary | ICD-10-CM

## 2020-06-15 DIAGNOSIS — M1711 Unilateral primary osteoarthritis, right knee: Secondary | ICD-10-CM | POA: Diagnosis not present

## 2020-06-15 DIAGNOSIS — W19XXXA Unspecified fall, initial encounter: Secondary | ICD-10-CM | POA: Insufficient documentation

## 2020-06-15 DIAGNOSIS — Z043 Encounter for examination and observation following other accident: Secondary | ICD-10-CM | POA: Diagnosis not present

## 2020-06-15 MED ORDER — DICLOFENAC SODIUM 75 MG PO TBEC
75.0000 mg | DELAYED_RELEASE_TABLET | Freq: Two times a day (BID) | ORAL | 0 refills | Status: DC
Start: 2020-06-15 — End: 2021-02-25

## 2020-06-15 NOTE — Progress Notes (Signed)
   Subjective:    Patient ID: [name], male    DOB: 08/25/1956, 64 y.o.   MRN: [id number]  HPI Patient comes in today after falling out of bed last Monday. Complains of right lower leg pain and swelling. Patient fell out of bed and injured right knee difficult time standing and walking on it this is been present over the past several days denies any calf pain history of diabetes circulation issues and chronic pain Review of Systems See above    Objective:   Physical Exam Lungs clear respiratory rate normal heart regular no murmurs right knee swollen tender to touch calf no tenderness no pain foot exam stable       Assessment & Plan:  Right knee injury X-rays indicated Pain goes down his lower leg so therefore we will x-ray tibia-fibula and the knee in addition to this if not better in the next 7 to 14 days notify us we will see with orthopedics no sign of infection May use anti-inflammatory for 7 to 14 days in addition to his pain meds

## 2020-06-23 ENCOUNTER — Other Ambulatory Visit: Payer: Self-pay | Admitting: Family Medicine

## 2020-06-30 DIAGNOSIS — L97512 Non-pressure chronic ulcer of other part of right foot with fat layer exposed: Secondary | ICD-10-CM | POA: Diagnosis not present

## 2020-06-30 DIAGNOSIS — E1151 Type 2 diabetes mellitus with diabetic peripheral angiopathy without gangrene: Secondary | ICD-10-CM | POA: Diagnosis not present

## 2020-06-30 DIAGNOSIS — E1142 Type 2 diabetes mellitus with diabetic polyneuropathy: Secondary | ICD-10-CM | POA: Diagnosis not present

## 2020-06-30 DIAGNOSIS — M79671 Pain in right foot: Secondary | ICD-10-CM | POA: Diagnosis not present

## 2020-06-30 DIAGNOSIS — L851 Acquired keratosis [keratoderma] palmaris et plantaris: Secondary | ICD-10-CM | POA: Diagnosis not present

## 2020-07-08 ENCOUNTER — Ambulatory Visit: Payer: Medicare HMO | Admitting: Family Medicine

## 2020-07-14 DIAGNOSIS — E1151 Type 2 diabetes mellitus with diabetic peripheral angiopathy without gangrene: Secondary | ICD-10-CM | POA: Diagnosis not present

## 2020-07-14 DIAGNOSIS — L97512 Non-pressure chronic ulcer of other part of right foot with fat layer exposed: Secondary | ICD-10-CM | POA: Diagnosis not present

## 2020-07-14 DIAGNOSIS — E1142 Type 2 diabetes mellitus with diabetic polyneuropathy: Secondary | ICD-10-CM | POA: Diagnosis not present

## 2020-07-22 ENCOUNTER — Telehealth: Payer: Self-pay

## 2020-07-22 NOTE — Telephone Encounter (Signed)
Patient is requesting refill on percocet 10/325 called into [facility]

## 2020-07-23 ENCOUNTER — Other Ambulatory Visit: Payer: Self-pay | Admitting: Family Medicine

## 2020-07-23 MED ORDER — OXYCODONE-ACETAMINOPHEN 10-325 MG PO TABS: ORAL_TABLET | ORAL | 0 refills | Status: DC

## 2020-07-23 NOTE — Telephone Encounter (Signed)
Called today checking on prescription

## 2020-07-23 NOTE — Telephone Encounter (Signed)
Schedule the patient within 30 days for office visit regarding pain management refill was sent in

## 2020-07-23 NOTE — Telephone Encounter (Signed)
Tried to call both mobile and home number and no answer

## 2020-07-24 ENCOUNTER — Ambulatory Visit: Payer: Medicare HMO | Admitting: Family Medicine

## 2020-07-27 NOTE — Telephone Encounter (Signed)
Please either call or mail pt letter to make appointment.

## 2020-07-28 NOTE — Telephone Encounter (Signed)
Lvm for patient to call our office to schedule refill appt.

## 2020-08-04 DIAGNOSIS — B351 Tinea unguium: Secondary | ICD-10-CM | POA: Diagnosis not present

## 2020-08-04 DIAGNOSIS — E1142 Type 2 diabetes mellitus with diabetic polyneuropathy: Secondary | ICD-10-CM | POA: Diagnosis not present

## 2020-08-04 DIAGNOSIS — I739 Peripheral vascular disease, unspecified: Secondary | ICD-10-CM | POA: Diagnosis not present

## 2020-08-04 DIAGNOSIS — L851 Acquired keratosis [keratoderma] palmaris et plantaris: Secondary | ICD-10-CM | POA: Diagnosis not present

## 2020-08-11 ENCOUNTER — Telehealth: Payer: Self-pay

## 2020-08-11 NOTE — Telephone Encounter (Signed)
Pt dropped off Application for Disability parking placard form placed in Dr [employer] folder

## 2020-08-11 NOTE — Telephone Encounter (Signed)
Form was filled and please finish out the administrative portions and then forward to the patient thank you

## 2020-08-11 NOTE — Telephone Encounter (Signed)
Telephone call-voicemail not set up ?

## 2020-08-11 NOTE — Telephone Encounter (Signed)
Nurse to return call please didn't leave message

## 2020-08-12 NOTE — Telephone Encounter (Signed)
Telephone call-voicemail not set up ?

## 2020-08-14 ENCOUNTER — Other Ambulatory Visit: Payer: Self-pay | Admitting: Family Medicine

## 2020-08-17 ENCOUNTER — Telehealth: Payer: Self-pay

## 2020-08-17 DIAGNOSIS — Z794 Long term (current) use of insulin: Secondary | ICD-10-CM

## 2020-08-17 DIAGNOSIS — E1140 Type 2 diabetes mellitus with diabetic neuropathy, unspecified: Secondary | ICD-10-CM

## 2020-08-17 DIAGNOSIS — E785 Hyperlipidemia, unspecified: Secondary | ICD-10-CM

## 2020-08-17 DIAGNOSIS — Z79899 Other long term (current) drug therapy: Secondary | ICD-10-CM

## 2020-08-17 NOTE — Telephone Encounter (Signed)
Last visit 06/15/20. Last labs 03/20/20: lipid, liver, a1c and met 7.

## 2020-08-17 NOTE — Telephone Encounter (Signed)
Pt is calling for a refill on oxyCODONE-acetaminophen (PERCOCET) 10-325 MG tablet no refills left pt is wanting to know if blood work needs to be done or a follow up appt pt does not want run out can not get fill till 7th Nov. Wanting to speak to Abigail Butts  Pt is also checking on handicap stick form as well turned in last Tuesday.   Pt call [phone]

## 2020-08-17 NOTE — Telephone Encounter (Signed)
S9Q, lipid, metabolic 7

## 2020-08-19 ENCOUNTER — Other Ambulatory Visit: Payer: Self-pay | Admitting: Family Medicine

## 2020-08-19 MED ORDER — OXYCODONE-ACETAMINOPHEN 10-325 MG PO TABS: ORAL_TABLET | ORAL | 0 refills | Status: DC

## 2020-08-19 NOTE — Telephone Encounter (Signed)
Left message to return call. Handicap form up front ready for pickup.

## 2020-08-19 NOTE — Telephone Encounter (Signed)
Pain medication prescription was sent into his pharmacy for 08/22/2020 Patient needs new follow-up visit if he does not have it scheduled already by early December If he needs a handicap form please print and now I will sign it (I have done several forms in the past several days and sent them to the front)

## 2020-08-19 NOTE — Telephone Encounter (Signed)
Pt also checking on handicap sticker, and refill on oxycodone. Pt states he is not due til the 7th but had a hard time getting pain med last time so he is calling early this time. Pt notified labs are put in and he will pick [facility].

## 2020-08-21 NOTE — Telephone Encounter (Signed)
Discussed with pt. Pt verbalized understanding. States he will call back to make appt.

## 2020-08-25 DIAGNOSIS — R600 Localized edema: Secondary | ICD-10-CM | POA: Diagnosis not present

## 2020-08-25 DIAGNOSIS — R5383 Other fatigue: Secondary | ICD-10-CM | POA: Diagnosis not present

## 2020-08-25 DIAGNOSIS — Z794 Long term (current) use of insulin: Secondary | ICD-10-CM | POA: Diagnosis not present

## 2020-08-25 DIAGNOSIS — E782 Mixed hyperlipidemia: Secondary | ICD-10-CM | POA: Diagnosis not present

## 2020-08-25 DIAGNOSIS — E1140 Type 2 diabetes mellitus with diabetic neuropathy, unspecified: Secondary | ICD-10-CM | POA: Diagnosis not present

## 2020-08-26 LAB — BASIC METABOLIC PANEL
BUN: 21 mg/dL (ref 7–25)
CO2: 30 mmol/L (ref 20–32)
Calcium: 9 mg/dL (ref 8.6–10.3)
Chloride: 99 mmol/L (ref 98–110)
Creat: 1.12 mg/dL (ref 0.70–1.25)
Glucose, Bld: 232 mg/dL — ABNORMAL HIGH (ref 65–139)
Potassium: 4.9 mmol/L (ref 3.5–5.3)
Sodium: 135 mmol/L (ref 135–146)

## 2020-08-26 LAB — LIPID PANEL
Cholesterol: 167 mg/dL (ref <200)
HDL: 32 mg/dL — ABNORMAL LOW (ref >=40)
LDL Cholesterol (Calc): 116 mg/dL (calc) — ABNORMAL HIGH
Non-HDL Cholesterol (Calc): 135 mg/dL (calc) — ABNORMAL HIGH (ref <130)
Total CHOL/HDL Ratio: 5.2 (calc) — ABNORMAL HIGH (ref <5.0)
Triglycerides: 89 mg/dL (ref <150)

## 2020-08-26 LAB — HEMOGLOBIN A1C
Hgb A1c MFr Bld: 7.4 % of total Hgb — ABNORMAL HIGH (ref <5.7)
Mean Plasma Glucose: 166 (calc)
eAG (mmol/L): 9.2 (calc)

## 2020-09-08 DIAGNOSIS — E1151 Type 2 diabetes mellitus with diabetic peripheral angiopathy without gangrene: Secondary | ICD-10-CM | POA: Diagnosis not present

## 2020-09-08 DIAGNOSIS — L97519 Non-pressure chronic ulcer of other part of right foot with unspecified severity: Secondary | ICD-10-CM | POA: Diagnosis not present

## 2020-09-08 DIAGNOSIS — E1142 Type 2 diabetes mellitus with diabetic polyneuropathy: Secondary | ICD-10-CM | POA: Diagnosis not present

## 2020-09-15 ENCOUNTER — Ambulatory Visit (INDEPENDENT_AMBULATORY_CARE_PROVIDER_SITE_OTHER): Payer: Medicare HMO | Admitting: Family Medicine

## 2020-09-15 ENCOUNTER — Other Ambulatory Visit: Payer: Self-pay

## 2020-09-15 VITALS — BP 148/82 | Temp 97.0°F | Ht 74.0 in | Wt 264.2 lb

## 2020-09-15 DIAGNOSIS — Z23 Encounter for immunization: Secondary | ICD-10-CM | POA: Diagnosis not present

## 2020-09-15 DIAGNOSIS — E1169 Type 2 diabetes mellitus with other specified complication: Secondary | ICD-10-CM | POA: Diagnosis not present

## 2020-09-15 DIAGNOSIS — G8929 Other chronic pain: Secondary | ICD-10-CM

## 2020-09-15 DIAGNOSIS — E1142 Type 2 diabetes mellitus with diabetic polyneuropathy: Secondary | ICD-10-CM

## 2020-09-15 DIAGNOSIS — E782 Mixed hyperlipidemia: Secondary | ICD-10-CM

## 2020-09-15 DIAGNOSIS — Z89419 Acquired absence of unspecified great toe: Secondary | ICD-10-CM | POA: Diagnosis not present

## 2020-09-15 MED ORDER — OXYCODONE-ACETAMINOPHEN 10-325 MG PO TABS: ORAL_TABLET | ORAL | 0 refills | Status: DC

## 2020-09-15 MED ORDER — METFORMIN HCL ER 500 MG PO TB24: 500.0000 mg | ORAL_TABLET | Freq: Every day | ORAL | 5 refills | Status: DC

## 2020-09-15 MED ORDER — ATORVASTATIN CALCIUM 10 MG PO TABS
ORAL_TABLET | ORAL | 5 refills | Status: DC
Start: 2020-09-15 — End: 2021-03-10

## 2020-09-15 NOTE — Progress Notes (Signed)
Subjective:    Patient ID: [name], male    DOB: [birth year], 64 y.o.   MRN: [id number]  Hypertension This is a chronic problem. The current episode started more than 1 year ago. Pertinent negatives include no chest pain, headaches or shortness of breath. Risk factors for coronary artery disease include diabetes mellitus, dyslipidemia and male gender. Treatments tried: zestoretic, lasix. There are no compliance problems.    Discuss recent labs- discuss cost of meds  This patient was seen today for chronic pain  The medication list was reviewed and updated.   -Compliance with medication: Relates compliance  - Number patient states they take daily: 4-day  -when was the last dose patient took?  Earlier today  The patient was advised the importance of maintaining medication and not using illegal substances with these.  Here for refills and follow up  The patient was educated that we can provide 3 monthly scripts for their medication, it is their responsibility to follow the instructions.  Side effects or complications from medications: Denies side effects  Patient is aware that pain medications are meant to minimize the severity of the pain to allow their pain levels to improve to allow for better function. They are aware of that pain medications cannot totally remove their pain.  Due for UDT ( at least once per year) : Did 1 earlier this year  Scale of 1 to 10 ( 1 is least 10 is most) Your pain level without the medicine: 8 Your pain level with medication 3  Scale 1 to 10 ( 1-helps very little, 10 helps very well) How well does your pain medication reduce your pain so you can function better through out the day?  7  The patient was seen today as part of a comprehensive diabetic check up.the patient does have diabetes.  The patient follows here on a regular basis.  The patient relates medication compliance. No significant side effects to the medications. Denies any low  glucose spells. Relates compliance with diet to a reasonable level. Patient does do labwork intermittently and understands the dangers of diabetes.   [place] cannot afford     Review of Systems  Constitutional: Negative for diaphoresis and fatigue.  HENT: Negative for congestion and rhinorrhea.   Respiratory: Negative for cough and shortness of breath.   Cardiovascular: Negative for chest pain and leg swelling.  Gastrointestinal: Negative for abdominal pain and diarrhea.  Skin: Negative for color change and rash.  Neurological: Negative for dizziness and headaches.  Psychiatric/Behavioral: Negative for behavioral problems and confusion.       Objective:   Physical Exam Vitals reviewed.  Constitutional:      General: He is not in acute distress. HENT:     Head: Normocephalic and atraumatic.  Eyes:     General:        Right eye: No discharge.        Left eye: No discharge.  Neck:     Trachea: No tracheal deviation.  Cardiovascular:     Rate and Rhythm: Normal rate and regular rhythm.     Heart sounds: Normal heart sounds. No murmur heard.   Pulmonary:     Effort: Pulmonary effort is normal. No respiratory distress.     Breath sounds: Normal breath sounds.  Lymphadenopathy:     Cervical: No cervical adenopathy.  Skin:    General: Skin is warm and dry.  Neurological:     Mental Status: He is alert.  Coordination: Coordination normal.  Psychiatric:        Behavior: Behavior normal.           Assessment & Plan:  1. Encounter for chronic pain management The patient was seen in followup for chronic pain. A review over at their current pain status was discussed. Drug registry was checked. Prescriptions were given.  Regular follow-up recommended. Discussion was held regarding the importance of compliance with medication as well as pain medication contract.  Patient was informed that medication may cause drowsiness and should not be combined  with  other medications/alcohol or street drugs. If the patient feels medication is causing altered alertness then do not drive or operate dangerous equipment.  Prescriptions were sent in [place] was checked  2. Diabetic peripheral neuropathy ([place]) Keep diabetes under good control watch diet continue current medication cannot afford high dollar medicine therefore continue insulin will go ahead with long-acting Metformin if he cannot tolerate this he will let us know Also low-dose statin did not tolerate other doses  3. DM type 2 with diabetic mixed hyperlipidemia ([place]) See above patient will do a better job watching diet he will try the Metformin to see if the extended release does better does not have intestinal symptoms we will relook again A1c in 3 to 4 months  4. History of amputation of great toe ([place]) Foot exam looks good today  5. Need for vaccination Flu shot - Flu Vaccine QUAD High Dose(Fluad)

## 2020-09-24 ENCOUNTER — Ambulatory Visit: Payer: Medicare HMO | Attending: Internal Medicine

## 2020-09-24 DIAGNOSIS — Z23 Encounter for immunization: Secondary | ICD-10-CM

## 2020-09-24 NOTE — Progress Notes (Signed)
   Covid-19 Vaccination Clinic  Name:  [name]    MRN: [id number] DOB: 03-25-1956  09/24/2020  [name] was observed post Covid-19 immunization for 15 minutes without incident. He was provided with Vaccine Information Sheet and instruction to access the V-Safe system.   [name] was instructed to call 911 with any severe reactions post vaccine: Marland Kitchen Difficulty breathing  . Swelling of face and throat  . A fast heartbeat  . A bad rash all over body  . Dizziness and weakness   Immunizations Administered    No immunizations on file.

## 2020-10-13 DIAGNOSIS — I739 Peripheral vascular disease, unspecified: Secondary | ICD-10-CM | POA: Diagnosis not present

## 2020-10-13 DIAGNOSIS — L851 Acquired keratosis [keratoderma] palmaris et plantaris: Secondary | ICD-10-CM | POA: Diagnosis not present

## 2020-10-13 DIAGNOSIS — E1142 Type 2 diabetes mellitus with diabetic polyneuropathy: Secondary | ICD-10-CM | POA: Diagnosis not present

## 2020-10-13 DIAGNOSIS — B351 Tinea unguium: Secondary | ICD-10-CM | POA: Diagnosis not present

## 2020-11-20 DIAGNOSIS — E1151 Type 2 diabetes mellitus with diabetic peripheral angiopathy without gangrene: Secondary | ICD-10-CM | POA: Diagnosis not present

## 2020-11-20 DIAGNOSIS — E1142 Type 2 diabetes mellitus with diabetic polyneuropathy: Secondary | ICD-10-CM | POA: Diagnosis not present

## 2020-11-20 DIAGNOSIS — L97519 Non-pressure chronic ulcer of other part of right foot with unspecified severity: Secondary | ICD-10-CM | POA: Diagnosis not present

## 2020-12-14 ENCOUNTER — Telehealth: Payer: Self-pay | Admitting: Family Medicine

## 2020-12-14 ENCOUNTER — Telehealth: Payer: Self-pay

## 2020-12-14 ENCOUNTER — Other Ambulatory Visit: Payer: Self-pay | Admitting: Family Medicine

## 2020-12-14 ENCOUNTER — Ambulatory Visit: Payer: Medicare HMO | Admitting: Family Medicine

## 2020-12-14 MED ORDER — OXYCODONE-ACETAMINOPHEN 10-325 MG PO TABS: ORAL_TABLET | ORAL | 0 refills | Status: DC

## 2020-12-14 NOTE — Telephone Encounter (Signed)
Error

## 2020-12-14 NOTE — Telephone Encounter (Signed)
Tried to call no answer

## 2020-12-14 NOTE — Telephone Encounter (Signed)
Refill was sent in please keep follow-up visit

## 2020-12-14 NOTE — Telephone Encounter (Signed)
Patient is requesting refill oxycodone 10/325 had appointment to 2/28 for pain management but had to reschedule due to provider was out. [facility]

## 2020-12-14 NOTE — Telephone Encounter (Signed)
Pt.notified

## 2020-12-21 DIAGNOSIS — E1151 Type 2 diabetes mellitus with diabetic peripheral angiopathy without gangrene: Secondary | ICD-10-CM | POA: Diagnosis not present

## 2020-12-21 DIAGNOSIS — L97501 Non-pressure chronic ulcer of other part of unspecified foot limited to breakdown of skin: Secondary | ICD-10-CM | POA: Diagnosis not present

## 2020-12-21 DIAGNOSIS — I69351 Hemiplegia and hemiparesis following cerebral infarction affecting right dominant side: Secondary | ICD-10-CM | POA: Diagnosis not present

## 2020-12-21 DIAGNOSIS — E669 Obesity, unspecified: Secondary | ICD-10-CM | POA: Diagnosis not present

## 2020-12-21 DIAGNOSIS — E1165 Type 2 diabetes mellitus with hyperglycemia: Secondary | ICD-10-CM | POA: Diagnosis not present

## 2020-12-21 DIAGNOSIS — Z794 Long term (current) use of insulin: Secondary | ICD-10-CM | POA: Diagnosis not present

## 2020-12-21 DIAGNOSIS — G8929 Other chronic pain: Secondary | ICD-10-CM | POA: Diagnosis not present

## 2020-12-21 DIAGNOSIS — E785 Hyperlipidemia, unspecified: Secondary | ICD-10-CM | POA: Diagnosis not present

## 2020-12-21 DIAGNOSIS — Z89411 Acquired absence of right great toe: Secondary | ICD-10-CM | POA: Diagnosis not present

## 2020-12-21 DIAGNOSIS — Z89421 Acquired absence of other right toe(s): Secondary | ICD-10-CM | POA: Diagnosis not present

## 2020-12-23 ENCOUNTER — Ambulatory Visit: Payer: Medicare HMO | Admitting: Family Medicine

## 2020-12-23 ENCOUNTER — Encounter: Payer: Self-pay | Admitting: Family Medicine

## 2020-12-30 ENCOUNTER — Other Ambulatory Visit: Payer: Self-pay

## 2020-12-30 ENCOUNTER — Ambulatory Visit (INDEPENDENT_AMBULATORY_CARE_PROVIDER_SITE_OTHER): Payer: Medicare HMO | Admitting: Family Medicine

## 2020-12-30 ENCOUNTER — Encounter: Payer: Self-pay | Admitting: Family Medicine

## 2020-12-30 VITALS — BP 118/66 | HR 93 | Temp 97.4°F | Ht 74.0 in | Wt 263.0 lb

## 2020-12-30 DIAGNOSIS — I739 Peripheral vascular disease, unspecified: Secondary | ICD-10-CM | POA: Diagnosis not present

## 2020-12-30 DIAGNOSIS — Z79899 Other long term (current) drug therapy: Secondary | ICD-10-CM | POA: Diagnosis not present

## 2020-12-30 DIAGNOSIS — Z89419 Acquired absence of unspecified great toe: Secondary | ICD-10-CM | POA: Diagnosis not present

## 2020-12-30 DIAGNOSIS — E1142 Type 2 diabetes mellitus with diabetic polyneuropathy: Secondary | ICD-10-CM

## 2020-12-30 DIAGNOSIS — E669 Obesity, unspecified: Secondary | ICD-10-CM | POA: Diagnosis not present

## 2020-12-30 DIAGNOSIS — E782 Mixed hyperlipidemia: Secondary | ICD-10-CM | POA: Diagnosis not present

## 2020-12-30 DIAGNOSIS — G894 Chronic pain syndrome: Secondary | ICD-10-CM | POA: Diagnosis not present

## 2020-12-30 DIAGNOSIS — E1169 Type 2 diabetes mellitus with other specified complication: Secondary | ICD-10-CM | POA: Diagnosis not present

## 2020-12-30 MED ORDER — OXYCODONE-ACETAMINOPHEN 10-325 MG PO TABS: ORAL_TABLET | ORAL | 0 refills | Status: DC

## 2020-12-30 NOTE — Progress Notes (Signed)
Subjective:    Patient ID: [name], male    DOB: 1956-10-14, 65 y.o.   MRN: [id number]  HPI This patient was seen today for chronic pain  The medication list was reviewed and updated.   -Compliance with medication: takes 4 a day  - Number patient states they take daily: 4  -when was the last dose patient took? today  The patient was advised the importance of maintaining medication and not using illegal substances with these.  Here for refills and follow up  The patient was educated that we can provide 3 monthly scripts for their medication, it is their responsibility to follow the instructions.  Side effects or complications from medications: none  Patient is aware that pain medications are meant to minimize the severity of the pain to allow their pain levels to improve to allow for better function. They are aware of that pain medications cannot totally remove their pain.  Due for UDT ( at least once per year) : 04/07/20  Scale of 1 to 10 ( 1 is least 10 is most) Your pain level without the medicine: 8-10 Your pain level with medication 4  Scale 1 to 10 ( 1-helps very little, 10 helps very well) How well does your pain medication reduce your pain so you can function better through out the day? 4  Patient's last A1c was 7.4 We did discuss the importance of checking his sugars on a regular basis and getting his morning sugars closer to 100 in the middle of the day anywhere from 150 to 200 would be permissible.  Patient is to write down his readings and how much insulin he is taking and send that to [place].  He states his wife manages this  Recent LDL elevated at 116 the importance of taking the medicine watching diet staying active was discussed in detail and getting the LDL below 70 Patient has not been able to tolerate a higher dose of statins await the upcoming lab work   Kidney function is surprisingly good given all of his health issues      Review of Systems   Constitutional: Negative for activity change.  HENT: Negative for congestion and rhinorrhea.   Respiratory: Negative for cough and shortness of breath.   Cardiovascular: Negative for chest pain.  Gastrointestinal: Negative for abdominal pain, diarrhea, nausea and vomiting.  Genitourinary: Negative for dysuria and hematuria.  Musculoskeletal: Positive for arthralgias and back pain.  Neurological: Negative for weakness and headaches.  Psychiatric/Behavioral: Negative for behavioral problems and confusion.       Objective:   Physical Exam Vitals reviewed.  Cardiovascular:     Rate and Rhythm: Normal rate and regular rhythm.     Heart sounds: Normal heart sounds. No murmur heard.   Pulmonary:     Effort: Pulmonary effort is normal.     Breath sounds: Normal breath sounds.  Lymphadenopathy:     Cervical: No cervical adenopathy.  Neurological:     Mental Status: He is alert.  Psychiatric:        Behavior: Behavior normal.    Patient has severe osteoarthritis both knees.  In addition to this patient has partial amputation of the right foot from previous infections and surgery  Patient does have peripheral neuropathy bilateral       Assessment & Plan:  1. Peripheral vascular disease ([place]) Patient has history of peripheral vascular disease as well as partial amputation of the foot very important to keep diabetes under control blood  pressure under control cholesterol under control.  This was reviewed with the patient.  2. Diabetic peripheral neuropathy (HCC) Severe peripheral neuropathy.  At risk of having more ulcers soon infections importance of daily foot inspections and proper foot care discussed  3. DM type 2 with diabetic mixed hyperlipidemia (HCC) Very important to keep cholesterol under control to lessen the risk of heart disease and keep A1c under good control portion control dietary choices regular physical activity - Hemoglobin A1c - Microalbumin / creatinine urine  ratio  4. History of amputation of great toe ([place]) History partial amputation of the right foot no current infection noted  5. Chronic pain syndrome Patient has chronic pain from previous neck surgery laminectomy and ongoing impingements plus also severe osteoarthritis.  Patient in the past has tried anti-inflammatories Tylenol without success he has been on pain medicine for several years.  He is responsible with how he takes his medication.  Does not abuse it.  Drug registry has been checked urine drug screen has been looking good  The patient was seen in followup for chronic pain. A review over at their current pain status was discussed. Drug registry was checked. Prescriptions were given.  Regular follow-up recommended. Discussion was held regarding the importance of compliance with medication as well as pain medication contract.  Patient was informed that medication may cause drowsiness and should not be combined  with other medications/alcohol or street drugs. If the patient feels medication is causing altered alertness then do not drive or operate dangerous equipment.    6. High risk medication use Check metabolic 7 - Basic metabolic panel  7. Obesity (BMI 30-39.9) Portion control regular physical activity recommended Follow-up in 3 months

## 2020-12-31 DIAGNOSIS — M1711 Unilateral primary osteoarthritis, right knee: Secondary | ICD-10-CM | POA: Diagnosis not present

## 2020-12-31 DIAGNOSIS — M25562 Pain in left knee: Secondary | ICD-10-CM | POA: Diagnosis not present

## 2021-01-01 ENCOUNTER — Other Ambulatory Visit: Payer: Self-pay | Admitting: *Deleted

## 2021-01-01 ENCOUNTER — Telehealth: Payer: Self-pay | Admitting: Family Medicine

## 2021-01-01 DIAGNOSIS — E1169 Type 2 diabetes mellitus with other specified complication: Secondary | ICD-10-CM

## 2021-01-01 DIAGNOSIS — E785 Hyperlipidemia, unspecified: Secondary | ICD-10-CM

## 2021-01-01 DIAGNOSIS — Z79899 Other long term (current) drug therapy: Secondary | ICD-10-CM

## 2021-01-01 DIAGNOSIS — E782 Mixed hyperlipidemia: Secondary | ICD-10-CM

## 2021-01-01 NOTE — Telephone Encounter (Signed)
[facility] for [facility] and at front window for pickup. Tried to call pt and vm not set up.

## 2021-01-01 NOTE — Telephone Encounter (Signed)
Tried to call no answer

## 2021-01-01 NOTE — Telephone Encounter (Signed)
Patient is wanting labs printed out to carry to [facility] instead of [facility]

## 2021-01-04 NOTE — Telephone Encounter (Signed)
Left message to return call 

## 2021-01-05 ENCOUNTER — Telehealth: Payer: Self-pay

## 2021-01-05 DIAGNOSIS — I739 Peripheral vascular disease, unspecified: Secondary | ICD-10-CM | POA: Diagnosis not present

## 2021-01-05 DIAGNOSIS — B351 Tinea unguium: Secondary | ICD-10-CM | POA: Diagnosis not present

## 2021-01-05 DIAGNOSIS — L84 Corns and callosities: Secondary | ICD-10-CM | POA: Diagnosis not present

## 2021-01-05 DIAGNOSIS — E1142 Type 2 diabetes mellitus with diabetic polyneuropathy: Secondary | ICD-10-CM | POA: Diagnosis not present

## 2021-01-05 NOTE — Telephone Encounter (Signed)
Pt contacted and verbalized understanding.  

## 2021-01-05 NOTE — Telephone Encounter (Signed)
[name] returned [place] phone   Pt call back [phone]

## 2021-01-05 NOTE — Telephone Encounter (Signed)
Returned call to patient. Please see other message.

## 2021-01-20 DIAGNOSIS — E782 Mixed hyperlipidemia: Secondary | ICD-10-CM | POA: Diagnosis not present

## 2021-01-20 DIAGNOSIS — E1169 Type 2 diabetes mellitus with other specified complication: Secondary | ICD-10-CM | POA: Diagnosis not present

## 2021-01-20 DIAGNOSIS — Z79899 Other long term (current) drug therapy: Secondary | ICD-10-CM | POA: Diagnosis not present

## 2021-01-21 LAB — BASIC METABOLIC PANEL
BUN: 19 mg/dL (ref 7–25)
CO2: 28 mmol/L (ref 20–32)
Calcium: 9.4 mg/dL (ref 8.6–10.3)
Chloride: 103 mmol/L (ref 98–110)
Creat: 1.07 mg/dL (ref 0.70–1.25)
Glucose, Bld: 237 mg/dL — ABNORMAL HIGH (ref 65–139)
Potassium: 4.6 mmol/L (ref 3.5–5.3)
Sodium: 139 mmol/L (ref 135–146)

## 2021-01-21 LAB — MICROALBUMIN / CREATININE URINE RATIO
Creatinine, Urine: 58 mg/dL (ref 20–320)
Microalb Creat Ratio: 107 mcg/mg creat — ABNORMAL HIGH (ref <30)
Microalb, Ur: 6.2 mg/dL

## 2021-01-21 LAB — HEMOGLOBIN A1C
Hgb A1c MFr Bld: 8.5 % of total Hgb — ABNORMAL HIGH (ref <5.7)
Mean Plasma Glucose: 197 mg/dL
eAG (mmol/L): 10.9 mmol/L

## 2021-01-22 ENCOUNTER — Telehealth: Payer: Self-pay | Admitting: Family Medicine

## 2021-01-22 ENCOUNTER — Other Ambulatory Visit: Payer: Self-pay

## 2021-01-22 DIAGNOSIS — E1169 Type 2 diabetes mellitus with other specified complication: Secondary | ICD-10-CM

## 2021-01-22 MED ORDER — METFORMIN HCL ER 500 MG PO TB24: 500.0000 mg | ORAL_TABLET | Freq: Two times a day (BID) | ORAL | 5 refills | Status: DC

## 2021-01-22 NOTE — Telephone Encounter (Signed)
Nurses I received a request from [facility] for surgical clearance for this patient  1.  This patient will need cardiology consultation for cardiac clearance before this procedure due to his significant risk factors  2.  Patient will need to do a follow-up office visit in May or early June for further clinical evaluation on our part so that we can complete surgical clearance  Please connect with patient set him up for May Initiate referral to cardiology for the above reasons Please keep form available so when the patient comes in it can be filled out thank you

## 2021-01-22 NOTE — Telephone Encounter (Signed)
Left message to return call 

## 2021-01-25 ENCOUNTER — Other Ambulatory Visit: Payer: Self-pay | Admitting: *Deleted

## 2021-01-25 DIAGNOSIS — Z01818 Encounter for other preprocedural examination: Secondary | ICD-10-CM

## 2021-01-25 NOTE — Telephone Encounter (Signed)
Called and discussed with pt. Pt verbalized understanding. Appt made for may with dr scott and referral put in to cardiology.

## 2021-02-03 ENCOUNTER — Telehealth: Payer: Self-pay | Admitting: Family Medicine

## 2021-02-03 NOTE — Telephone Encounter (Signed)
Patient is having knee surgery on 6/27 and needing to see cardiologist to get clearance and wanting to know who you recommend.

## 2021-02-03 NOTE — Telephone Encounter (Signed)
Pt contacted. Referral was placed on 01/25/21. Cardiology has attempted to get him scheduled but pt wanted Dr.Scott to recommend a cardiologist. Informed patient that provider recommends one of the cardiologist over at [facility] on [facility]. Pt verbalized understanding.

## 2021-02-12 ENCOUNTER — Telehealth: Payer: Self-pay

## 2021-02-12 DIAGNOSIS — E1151 Type 2 diabetes mellitus with diabetic peripheral angiopathy without gangrene: Secondary | ICD-10-CM | POA: Diagnosis not present

## 2021-02-12 DIAGNOSIS — E1142 Type 2 diabetes mellitus with diabetic polyneuropathy: Secondary | ICD-10-CM | POA: Diagnosis not present

## 2021-02-12 NOTE — Telephone Encounter (Signed)
Pt needs a RX for glucose meter that his insurance will cover and send to [facility]  Call back [phone]

## 2021-02-12 NOTE — Telephone Encounter (Signed)
Pt state his meter broke. Script placed on provider door. Please advise. Thank you (pt aware it may be today or Monday and pt verbalized understanding)

## 2021-02-14 NOTE — Telephone Encounter (Signed)
Was signed thank you 

## 2021-02-15 NOTE — Telephone Encounter (Signed)
Script faxed to pharmacy

## 2021-02-16 ENCOUNTER — Telehealth: Payer: Self-pay

## 2021-02-16 DIAGNOSIS — Z01818 Encounter for other preprocedural examination: Secondary | ICD-10-CM

## 2021-02-16 NOTE — Telephone Encounter (Signed)
Pt states he was told first appt with cardiologist would be in July. Has appt with dr Nicki Reaper on 5/12 for surgical clearance. Dr scott's note states he needs cardiology to do clearance. Does pt need to keep appt with dr Nicki Reaper? Should we change referral to cardiology to urgent to see if that helps get him in quicker or should pt push out knee surgery?

## 2021-02-16 NOTE — Telephone Encounter (Signed)
Please make cardiology consult urgently for presurgical clearance  If for some reason they are not able to do this in time for his surgery he will need to reschedule surgery  He should keep the follow-up visit with me on the 12th

## 2021-02-16 NOTE — Telephone Encounter (Signed)
Urgent referral placed and wife is aware. Wife verbalized understanding. Referral coordinator is also aware

## 2021-02-16 NOTE — Telephone Encounter (Signed)
Wife said that Dr. Nicki Reaper wanted husband to be evaluated by cardio prior to his knee replacement surgery on June 27th but they can't get any appts with any of the [facility] prior to surgery.  What should they do?

## 2021-02-25 ENCOUNTER — Encounter: Payer: Self-pay | Admitting: Family Medicine

## 2021-02-25 ENCOUNTER — Ambulatory Visit (INDEPENDENT_AMBULATORY_CARE_PROVIDER_SITE_OTHER): Payer: Medicare HMO | Admitting: Family Medicine

## 2021-02-25 ENCOUNTER — Other Ambulatory Visit: Payer: Self-pay

## 2021-02-25 VITALS — BP 129/80 | HR 72 | Temp 97.9°F | Ht 74.0 in | Wt 245.0 lb

## 2021-02-25 DIAGNOSIS — E782 Mixed hyperlipidemia: Secondary | ICD-10-CM | POA: Diagnosis not present

## 2021-02-25 DIAGNOSIS — E1142 Type 2 diabetes mellitus with diabetic polyneuropathy: Secondary | ICD-10-CM | POA: Diagnosis not present

## 2021-02-25 DIAGNOSIS — Z79899 Other long term (current) drug therapy: Secondary | ICD-10-CM

## 2021-02-25 DIAGNOSIS — M1711 Unilateral primary osteoarthritis, right knee: Secondary | ICD-10-CM | POA: Diagnosis not present

## 2021-02-25 DIAGNOSIS — Z89419 Acquired absence of unspecified great toe: Secondary | ICD-10-CM

## 2021-02-25 DIAGNOSIS — I739 Peripheral vascular disease, unspecified: Secondary | ICD-10-CM

## 2021-02-25 DIAGNOSIS — E1169 Type 2 diabetes mellitus with other specified complication: Secondary | ICD-10-CM

## 2021-02-25 NOTE — Progress Notes (Signed)
   Subjective:    Patient ID: [name], male    DOB: 1956/05/13, 65 y.o.   MRN: [id number]  HPIpt here for surgical clearance for knee replacement.   Pt states he needs to see cardiology but appt scheduled was too far out.  Patient states he will be seeing cardiology in June The closer he gets to surgery the more hesitant he is He states he would like to get a second opinion at [facility] for further evaluation He does have underlying risk factors of obesity peripheral vascular disease uncontrolled diabetes and chronic pain   Review of Systems See above    Objective:   Physical Exam  Lungs clear heart regular pulse normal BP good extremities no edema skin warm dry      Assessment & Plan:  1. Peripheral vascular disease ([place]) Need to do artery screening.  May need further detailed work very important to do before his surgery - US ARTERIAL ABI (SCREENING LOWER EXTREMITY)  2. Primary osteoarthritis of right knee Get second opinion with [facility] per patient request - Ambulatory referral to Orthopedic Surgery Patient specifically prefers Dr.Seyler 3. DM type 2 with diabetic mixed hyperlipidemia ([place]) Poor control previously work harder at diet exercise repeat this again before follow-up visit for pain management - Hemoglobin A1c  4. Diabetic peripheral neuropathy ([place]) Continue to monitor sugars currently his meter is broken he will get it fixed he will monitor he will send [place] some readings he will do a better job of staying active watching his diet and taking his meds - Hemoglobin A1c  5. High risk medication use See above - Basic metabolic panel  6. History of amputation of great toe ([place]) No signs of ulcers today but is at risk for further trouble  Will be going cardiology referral in June for preoperative before having total knee operation  Patient has follow-up later in June

## 2021-03-02 ENCOUNTER — Telehealth: Payer: Self-pay | Admitting: Family Medicine

## 2021-03-08 ENCOUNTER — Ambulatory Visit (HOSPITAL_COMMUNITY)
Admission: RE | Admit: 2021-03-08 | Discharge: 2021-03-08 | Disposition: A | Discharge status: Home / Self Care | Payer: Medicare HMO | Source: Ambulatory Visit | Attending: Family Medicine | Admitting: Family Medicine

## 2021-03-08 ENCOUNTER — Encounter: Payer: Self-pay | Admitting: Family Medicine

## 2021-03-08 ENCOUNTER — Telehealth: Payer: Self-pay | Admitting: Family Medicine

## 2021-03-08 ENCOUNTER — Other Ambulatory Visit: Payer: Self-pay

## 2021-03-08 DIAGNOSIS — I739 Peripheral vascular disease, unspecified: Secondary | ICD-10-CM | POA: Insufficient documentation

## 2021-03-08 NOTE — Telephone Encounter (Signed)
Called and discussed with pt. Pt verbalized understanding and his wife wrote down appt information.

## 2021-03-08 NOTE — Telephone Encounter (Signed)
Nurses- this patient was referred to [facility].  Apparently they have been trying to get a hold of him and unsuccessful.  They went ahead and scheduled him with the following Dr. At [facility]. Nicki Reaper  May schedule it at the time listed below and at the address listed I highly recommend that the patient call that doctor's office to confirm that appointment It is also very important for him to make sure he goes to that appointment because often it can take weeks to get back in and should he missed this appointment   Seyler, Carmin Richmond, MD  [address]  [address]  [place], [place] [address]  Phone: [phone]  Fax: [phone]     Donney Rankins ([place])  Wolfgang Phoenix, Elayne Snare, MD   I tried calling patient at both numbers, unable to leave a message at either number. I pushed for an email to be sent for patient to set up [place]. I went ahead and scheduled the patient for July 13th at 3:30pm.  Thanks  Margreta Journey

## 2021-03-10 ENCOUNTER — Other Ambulatory Visit: Payer: Self-pay | Admitting: Family Medicine

## 2021-03-11 ENCOUNTER — Telehealth: Payer: Medicare HMO | Admitting: Family Medicine

## 2021-03-11 NOTE — Telephone Encounter (Signed)
Patient has decided to put off his surgery. (He is looking at having a second opinion through [facility]) He was instructed to notify [facility] that he was putting this off  [facility] did send a form for his preoperative surgery.  I have put on this form that the patient has decided to put off surgery for now  Please fax form to [facility] thank you

## 2021-03-13 ENCOUNTER — Other Ambulatory Visit: Payer: Self-pay | Admitting: Family Medicine

## 2021-03-13 DIAGNOSIS — E1169 Type 2 diabetes mellitus with other specified complication: Secondary | ICD-10-CM

## 2021-03-16 DIAGNOSIS — B351 Tinea unguium: Secondary | ICD-10-CM | POA: Diagnosis not present

## 2021-03-16 DIAGNOSIS — I739 Peripheral vascular disease, unspecified: Secondary | ICD-10-CM | POA: Diagnosis not present

## 2021-03-16 DIAGNOSIS — L84 Corns and callosities: Secondary | ICD-10-CM | POA: Diagnosis not present

## 2021-03-16 DIAGNOSIS — E1142 Type 2 diabetes mellitus with diabetic polyneuropathy: Secondary | ICD-10-CM | POA: Diagnosis not present

## 2021-03-22 NOTE — Progress Notes (Signed)
Cardiology Office Note   Date:  03/23/2021   ID:  [name], DOB 25-Jul-1956, MRN [id number]  PCP:  Kathyrn Drown, MD  Cardiologist:   Dorris Carnes, MD   Pt presents in consult for preop risk stratification      History of Present Illness: [name] is a 65 y.o. male with a history of DM with neuropathy,  CAD (s/p BMS to RCA in 2008), PVOD (s/p amutation of R first toe 2016); who is followed by Lance Sell.   Being evaluated for knee surgery   Needs cardiac clearance    The pt denies CP   No SOB with usual activity   Activity is limted some by arthritis   No PND  No orthopnea  No dizziness        Current Meds  Medication Sig  . aspirin 325 MG tablet Take 325 mg by mouth every evening.   Marland Kitchen atorvastatin (LIPITOR) 10 MG tablet TAKE ONE TABLET BY MOUTH ON MONDAY AND FRIDAY  . B-D ULTRAFINE III SHORT PEN 31G X 8 MM MISC USE PEN NEEDLES DAILY WITH LANTUS  . betamethasone valerate ointment (VALISONE) 0.1 % APPLY 1 APPLICATION TOPICALLY 2 (TWO) TIMES DAILY AS NEEDED. (Patient taking differently: Apply 1 application topically 2 (two) times daily as needed (Psoriasis).)  . furosemide (LASIX) 20 MG tablet Take 20 mg by mouth daily as needed. For fluid retention  . KLOR-CON M20 20 MEQ tablet TAKE 1 TABLET (20 MEQ TOTAL) BY MOUTH DAILY. WHEN TAKING LASIX (Patient taking differently: Take 20 mEq by mouth daily as needed (With Furosemide).)  . LEVEMIR FLEXTOUCH 100 UNIT/ML FlexPen INJECT 54 UNITS AT BEDTIME - MAY TITRATE UP TO 70 UNITS INTO SKIN AT BEDTIME FOR DIABETES  . lisinopril-hydrochlorothiazide (ZESTORETIC) 20-25 MG tablet Take 1 tablet by mouth daily.  . metFORMIN (GLUCOPHAGE-XR) 500 MG 24 hr tablet TAKE 1 TABLET BY MOUTH EVERY DAY WITH BREAKFAST  . Multiple Vitamins-Minerals (MULTIVITAMIN WITH MINERALS) tablet Take 1 tablet by mouth daily.  . Omega-3 Fatty Acids (FISH OIL PO) Take 1 tablet by mouth 3 (three) times daily.   Marland Kitchen oxyCODONE-acetaminophen (PERCOCET) 10-325 MG tablet Take  one tablet by mouth every 6 hours prn pain  . oxyCODONE-acetaminophen (PERCOCET) 10-325 MG tablet Take one tablet by mouth every 6 hours prn pain  . oxyCODONE-acetaminophen (PERCOCET) 10-325 MG tablet Take one tablet by mouth every 6 hours prn pain     Allergies:   Metformin and related, Statins, and Vicodin [hydrocodone-acetaminophen]   Past Medical History:  Diagnosis Date  . CAD (coronary artery disease)    BMS to RCA 2008  . Cardiomyopathy ([place])   . Depression   . DJD (degenerative joint disease)   . DM type 2 with diabetic mixed hyperlipidemia ([place]) 12/11/2018  . Essential hypertension   . Gout   . History of inferior wall myocardial infarction 2008  . Hypercholesterolemia   . Hyperlipidemia   . Obesity   . Oral candidiasis   . Peripheral vascular disease ([place])   . Stroke [facility])    December 2019  . Type 2 diabetes mellitus ([place])     Past Surgical History:  Procedure Laterality Date  . AMPUTATION Right 01/12/2015   Procedure: PARTIAL AMPUTATION 1ST RAY RIGHT FOOT (Amputation of right great toe and portion of the first metatarsal bone right foot);  Surgeon: Caprice Beaver, DPM;  Location: [facility];  Service: Podiatry;  Laterality: Right;  . AMPUTATION Right 09/14/2016   Procedure: PARTIAL AMPUTATION  RAY 2ND TOE RIGHT FOOT;  Surgeon: Caprice Beaver, DPM;  Location: [facility];  Service: Podiatry;  Laterality: Right;  . COLONOSCOPY  08/2008  . COLONOSCOPY WITH PROPOFOL N/A 06/02/2020   Procedure: COLONOSCOPY WITH PROPOFOL;  Surgeon: [name], DO;  Location: [facility];  Service: Endoscopy;  Laterality: N/A;  7:30am  . ESOPHAGOGASTRODUODENOSCOPY  08/2008  . LAMINOTOMY  2001, 2006     Right L4-5 interlaminar laminotomy with excision of herniated   disc with operating microscope.  Marland Kitchen NECK SURGERY     cervical disc  . ORIF WRIST FRACTURE Right   . PERIPHERAL VASCULAR CATHETERIZATION N/A 02/27/2015   Procedure: Abdominal Aortogram;  Surgeon: Elam Dutch, MD;   Location: [place] CV LAB;  Service: Cardiovascular;  Laterality: N/A;  . PERIPHERAL VASCULAR CATHETERIZATION Bilateral 02/27/2015   Procedure: Lower Extremity Angiography;  Surgeon: Elam Dutch, MD;  Location: [employer] CV LAB;  Service: Cardiovascular;  Laterality: Bilateral;  . POLYPECTOMY  06/02/2020   Procedure: POLYPECTOMY;  Surgeon: [name], DO;  Location: [facility];  Service: Endoscopy;;  . Right ankle fracture open reduction internal fixation.  2003  . WOUND DEBRIDEMENT Right 12/24/2014   Procedure: DEBRIDEMENT SOFT TISSUE AND BONE RIGHT FOOT;  Surgeon: Caprice Beaver, DPM;  Location: [facility];  Service: Podiatry;  Laterality: Right;     Social History:  The patient  reports that he quit smoking about 50 years ago. His smoking use included cigarettes. He has a 10.00 pack-year smoking history. He has never used smokeless tobacco. He reports previous drug use. Drug: Marijuana. He reports that he does not drink alcohol.   Family History:  The patient's family history includes Cancer in his sister; Colon cancer in his mother; Diabetes in his father and sister; Heart disease in his sister; Lung cancer in his father.    ROS:  Please see the history of present illness. All other systems are reviewed and  Negative to the above problem except as noted.    PHYSICAL EXAM: VS:  BP 132/70   Pulse 78   Ht 6\' 3"  (1.905 m)   Wt 241 lb (109.3 kg)   SpO2 98%   BMI 30.12 kg/m   GEN: Well nourished, well developed, in no acute distress  HEENT: normal  Neck: no JVD, carotid bruits Cardiac: RRR; no murmurs  Triv LE  edema   Tr PT pulse   Respiratory:  clear to auscultation bilaterally, GI: soft, nontender, nondistended, + BS No RUQ tenderness MS: Moving extremities     Did not examine feet   Skin: warm and dry  Excor lesions on legs   Neuro:  Strength and sensation are intact Psych: euthymic mood, full affect   EKG:  EKG is ordered today.  SR  69   LBBB   Lipid  Panel    Component Value Date/Time   CHOL 167 08/25/2020 1034   CHOL 131 06/15/2015 0900   TRIG 89 08/25/2020 1034   HDL 32 (L) 08/25/2020 1034   HDL 33 (L) 06/15/2015 0900   CHOLHDL 5.2 (H) 08/25/2020 1034   VLDL 21 09/19/2018 0501   LDLCALC 116 (H) 08/25/2020 1034      Wt Readings from Last 3 Encounters:  03/23/21 241 lb (109.3 kg)  02/25/21 245 lb (111.1 kg)  12/30/20 263 lb (119.3 kg)      ASSESSMENT AND PLAN:  Preop evaluatoin  2  CAD  Pt with remote intervention to RCA   No symptoms  to sugg angina     But he is not that active  And he has DM with neuropathy   WOUld recomm a Lexiscan myovue to r/o inducible ischemai   3  PVOD  S/p amputation Gr toe.  Will get LE dopplers    4  CHF  Chronic systolic    Repeat echo   Overall volume status appears OK   He is not on GDMT    Will review echo    5  HL  Continue lipids     6   Hx CVA  I do not see record     7  Hx DM  Last A1C was 8.5   Admits addiction to sweets   Reviewed dangers   Try to cut back    F/U tentative in 4 months to reassess meds   Current medicines are reviewed at length with the patient today.  The patient does not have concerns regarding medicines.  Signed, Dorris Carnes, MD  03/23/2021 9:10 AM    [facility] [place], [place], [place]  [address] Phone: [phone]; Fax: [phone]

## 2021-03-23 ENCOUNTER — Other Ambulatory Visit: Payer: Self-pay

## 2021-03-23 ENCOUNTER — Ambulatory Visit (INDEPENDENT_AMBULATORY_CARE_PROVIDER_SITE_OTHER): Payer: Medicare HMO | Admitting: Internal Medicine

## 2021-03-23 ENCOUNTER — Encounter: Payer: Self-pay | Admitting: Internal Medicine

## 2021-03-23 VITALS — BP 132/70 | HR 78 | Ht 75.0 in | Wt 241.0 lb

## 2021-03-23 DIAGNOSIS — Z01818 Encounter for other preprocedural examination: Secondary | ICD-10-CM | POA: Diagnosis not present

## 2021-03-23 NOTE — Patient Instructions (Signed)
Medication Instructions:  Your physician recommends that you continue on your current medications as directed. Please refer to the Current Medication list given to you today.  *If you need a refill on your cardiac medications before your next appointment, please call your pharmacy*   Lab Work: None If you have labs (blood work) drawn today and your tests are completely normal, you will receive your results only by: Marland Kitchen MyChart Message (if you have MyChart) OR . A paper copy in the mail If you have any lab test that is abnormal or we need to change your treatment, we will call you to review the results.   Testing/Procedures: ECHO Myoview Carotid US Lower Ex. Dop.  Follow-Up: At [facility], you and your health needs are our priority.  As part of our continuing mission to provide you with exceptional heart care, we have created designated Provider Care Teams.  These Care Teams include your primary Cardiologist (physician) and Advanced Practice Providers (APPs -  Physician Assistants and Nurse Practitioners) who all work together to provide you with the care you need, when you need it.  We recommend signing up for the patient portal called "MyChart".  Sign up information is provided on this After Visit Summary.  MyChart is used to connect with patients for Virtual Visits (Telemedicine).  Patients are able to view lab/test results, encounter notes, upcoming appointments, etc.  Non-urgent messages can be sent to your provider as well.   To learn more about what you can do with MyChart, go to [email or web address].    Your next appointment:   4 month(s)  The format for your next appointment:   In Person  Provider:   Dorris Carnes, MD      Other Instructions

## 2021-04-02 ENCOUNTER — Encounter (HOSPITAL_COMMUNITY)
Admission: RE | Admit: 2021-04-02 | Discharge: 2021-04-02 | Disposition: A | Discharge status: Home / Self Care | Payer: Medicare HMO | Source: Ambulatory Visit | Attending: Internal Medicine | Admitting: Internal Medicine

## 2021-04-02 ENCOUNTER — Encounter (HOSPITAL_COMMUNITY): Admission: RE | Admit: 2021-04-02 | Payer: Medicare HMO | Source: Ambulatory Visit

## 2021-04-02 DIAGNOSIS — Z01818 Encounter for other preprocedural examination: Secondary | ICD-10-CM

## 2021-04-05 ENCOUNTER — Other Ambulatory Visit (HOSPITAL_COMMUNITY): Payer: Medicare HMO

## 2021-04-05 ENCOUNTER — Ambulatory Visit (HOSPITAL_COMMUNITY)
Admission: RE | Admit: 2021-04-05 | Discharge: 2021-04-05 | Disposition: A | Discharge status: Home / Self Care | Payer: Medicare HMO | Source: Ambulatory Visit | Attending: Internal Medicine | Admitting: Internal Medicine

## 2021-04-05 ENCOUNTER — Encounter (HOSPITAL_COMMUNITY)
Admission: RE | Admit: 2021-04-05 | Discharge: 2021-04-05 | Disposition: A | Discharge status: Home / Self Care | Payer: Medicare HMO | Source: Ambulatory Visit | Attending: Internal Medicine | Admitting: Internal Medicine

## 2021-04-05 ENCOUNTER — Other Ambulatory Visit: Payer: Self-pay

## 2021-04-05 DIAGNOSIS — Z01818 Encounter for other preprocedural examination: Secondary | ICD-10-CM | POA: Diagnosis not present

## 2021-04-05 DIAGNOSIS — Z01810 Encounter for preprocedural cardiovascular examination: Secondary | ICD-10-CM

## 2021-04-05 DIAGNOSIS — I2510 Atherosclerotic heart disease of native coronary artery without angina pectoris: Secondary | ICD-10-CM | POA: Diagnosis not present

## 2021-04-05 LAB — NM MYOCAR MULTI W/SPECT W/WALL MOTION / EF
LV dias vol: 269 mL (ref 62–150)
LV sys vol: 193 mL
Peak HR: 76 {beats}/min
RATE: 0.55
Rest HR: 60 {beats}/min
SDS: 5
SRS: 20
SSS: 25
TID: 1.1

## 2021-04-05 MED ORDER — SODIUM CHLORIDE FLUSH 0.9 % IV SOLN
INTRAVENOUS | Status: AC
  Administered 2021-04-05: 10 mL via INTRAVENOUS
  Filled 2021-04-05: qty 10

## 2021-04-05 MED ORDER — REGADENOSON 0.4 MG/5ML IV SOLN
INTRAVENOUS | Status: AC
  Administered 2021-04-05: 0.4 mg via INTRAVENOUS
  Filled 2021-04-05: qty 5

## 2021-04-05 MED ORDER — TECHNETIUM TC 99M TETROFOSMIN IV KIT
30.0000 | PACK | Freq: Once | INTRAVENOUS | Status: AC | PRN
  Administered 2021-04-05: 29.2 via INTRAVENOUS

## 2021-04-05 MED ORDER — TECHNETIUM TC 99M TETROFOSMIN IV KIT
10.0000 | PACK | Freq: Once | INTRAVENOUS | Status: AC | PRN
  Administered 2021-04-05: 10.17 via INTRAVENOUS

## 2021-04-07 ENCOUNTER — Ambulatory Visit (HOSPITAL_COMMUNITY)
Admission: RE | Admit: 2021-04-07 | Discharge: 2021-04-07 | Disposition: A | Discharge status: Home / Self Care | Payer: Medicare HMO | Source: Ambulatory Visit | Attending: Internal Medicine | Admitting: Internal Medicine

## 2021-04-07 ENCOUNTER — Telehealth: Payer: Self-pay | Admitting: Internal Medicine

## 2021-04-07 ENCOUNTER — Other Ambulatory Visit: Payer: Self-pay

## 2021-04-07 DIAGNOSIS — I6523 Occlusion and stenosis of bilateral carotid arteries: Secondary | ICD-10-CM | POA: Diagnosis not present

## 2021-04-07 DIAGNOSIS — E119 Type 2 diabetes mellitus without complications: Secondary | ICD-10-CM | POA: Diagnosis not present

## 2021-04-07 DIAGNOSIS — I509 Heart failure, unspecified: Secondary | ICD-10-CM | POA: Insufficient documentation

## 2021-04-07 DIAGNOSIS — Z01818 Encounter for other preprocedural examination: Secondary | ICD-10-CM

## 2021-04-07 DIAGNOSIS — I371 Nonrheumatic pulmonary valve insufficiency: Secondary | ICD-10-CM

## 2021-04-07 DIAGNOSIS — Z87891 Personal history of nicotine dependence: Secondary | ICD-10-CM | POA: Diagnosis not present

## 2021-04-07 DIAGNOSIS — E785 Hyperlipidemia, unspecified: Secondary | ICD-10-CM | POA: Insufficient documentation

## 2021-04-07 DIAGNOSIS — I252 Old myocardial infarction: Secondary | ICD-10-CM | POA: Insufficient documentation

## 2021-04-07 DIAGNOSIS — Z01810 Encounter for preprocedural cardiovascular examination: Secondary | ICD-10-CM | POA: Diagnosis not present

## 2021-04-07 DIAGNOSIS — I110 Hypertensive heart disease with heart failure: Secondary | ICD-10-CM | POA: Insufficient documentation

## 2021-04-07 LAB — ECHOCARDIOGRAM COMPLETE
Area-P 1/2: 3.85 cm2
Calc EF: 31.6 %
S' Lateral: 5.7 cm
Single Plane A2C EF: 35.9 %
Single Plane A4C EF: 26.6 %

## 2021-04-07 MED ORDER — PERFLUTREN LIPID MICROSPHERE
1.0000 mL | INTRAVENOUS | Status: DC | PRN
  Administered 2021-04-07: 2 mL via INTRAVENOUS
  Filled 2021-04-07: qty 10

## 2021-04-07 NOTE — Telephone Encounter (Signed)
[name] from [facility] is calling again. The patient is at the facility and they need clarification

## 2021-04-07 NOTE — Progress Notes (Signed)
*  PRELIMINARY RESULTS* Echocardiogram 2D Echocardiogram has been performed with [facility].  Samuel Germany 04/07/2021, 10:12 AM

## 2021-04-07 NOTE — Telephone Encounter (Signed)
Spoke with Lexie at [facility]. Pt had normal ABIs last month.  Adv not to repeat.  He is needing pre op cardiac clearance.  He had a nuc stress test and getting carotid US today.  Sees Dr. Harrington Challenger in [place].  Will route to make Dr. Harrington Challenger aware.

## 2021-04-07 NOTE — Telephone Encounter (Signed)
[name] from [facility] states the patient has a [place] Abi scheduled, but he already had this test done with his PCP a month ago. She would like to know if he still needs this again since it is a duplicate test and the results last time were normal. He comes in today at 11:30 am for the test.

## 2021-04-12 ENCOUNTER — Ambulatory Visit: Admit: 2021-04-12 | Payer: Medicare HMO | Admitting: Orthopedic Surgery

## 2021-04-12 SURGERY — ARTHROPLASTY, KNEE, TOTAL
Anesthesia: Choice | Site: Knee | Laterality: Right

## 2021-04-15 ENCOUNTER — Ambulatory Visit (INDEPENDENT_AMBULATORY_CARE_PROVIDER_SITE_OTHER): Payer: Medicare HMO | Admitting: Family Medicine

## 2021-04-15 ENCOUNTER — Other Ambulatory Visit: Payer: Self-pay

## 2021-04-15 ENCOUNTER — Encounter: Payer: Self-pay | Admitting: Family Medicine

## 2021-04-15 VITALS — BP 132/78 | HR 81 | Temp 98.1°F | Ht 75.0 in | Wt 242.0 lb

## 2021-04-15 DIAGNOSIS — I739 Peripheral vascular disease, unspecified: Secondary | ICD-10-CM | POA: Diagnosis not present

## 2021-04-15 DIAGNOSIS — M1711 Unilateral primary osteoarthritis, right knee: Secondary | ICD-10-CM

## 2021-04-15 DIAGNOSIS — E1142 Type 2 diabetes mellitus with diabetic polyneuropathy: Secondary | ICD-10-CM

## 2021-04-15 DIAGNOSIS — E1169 Type 2 diabetes mellitus with other specified complication: Secondary | ICD-10-CM

## 2021-04-15 DIAGNOSIS — Z79891 Long term (current) use of opiate analgesic: Secondary | ICD-10-CM | POA: Diagnosis not present

## 2021-04-15 DIAGNOSIS — Z89419 Acquired absence of unspecified great toe: Secondary | ICD-10-CM

## 2021-04-15 DIAGNOSIS — E782 Mixed hyperlipidemia: Secondary | ICD-10-CM

## 2021-04-15 MED ORDER — OXYCODONE-ACETAMINOPHEN 10-325 MG PO TABS: ORAL_TABLET | ORAL | 0 refills | Status: DC

## 2021-04-15 NOTE — Patient Instructions (Signed)
Do your labs on July 6th

## 2021-04-15 NOTE — Progress Notes (Signed)
Subjective:    Patient ID: [name], male    DOB: [birth year], 65 y.o.   MRN: [id number]  HPI  This patient was seen today for chronic pain  The medication list was reviewed and updated.  Location of Pain for which the patient has been treated with regarding narcotics: r foot   Onset of this pain: years ago  chronic    -Compliance with medication: daily  - Number patient states they take daily: 4 tabs  -when was the last dose patient took? 4 am this am   The patient was advised the importance of maintaining medication and not using illegal substances with these.  Here for refills and follow up  The patient was educated that we can provide 3 monthly scripts for their medication, it is their responsibility to follow the instructions.  Side effects or complications from medications: none  Patient is aware that pain medications are meant to minimize the severity of the pain to allow their pain levels to improve to allow for better function. They are aware of that pain medications cannot totally remove their pain.  Due for UDT ( at least once per year) : 04/07/20  Scale of 1 to 10 ( 1 is least 10 is most Your pain level without the medicine: 10 or more Your pain level with medication 1 to 2   Scale 1 to 10 ( 1-helps very little, 10 helps very well) How well does your pain medication reduce your pain so you can function better through out the day? Helps daily living  Quality of the pain: sharp opain  Persistence of the pain: al the time  Modifying factors: none  The patient was seen today as part of a comprehensive diabetic check up.the patient does have diabetes.  The patient follows here on a regular basis.  The patient relates medication compliance. No significant side effects to the medications. Denies any low glucose spells. Relates compliance with diet to a reasonable level. Patient does do labwork intermittently and understands the dangers of diabetes.  Patient  for blood pressure check up.  The patient does have hypertension.  The patient is on medication.  Patient relates compliance with meds. Todays BP reviewed with the patient. Patient denies issues with medication. Patient relates reasonable diet. Patient tries to minimize salt. Patient aware of BP goals.  We did review over his medicines today we also discussed healthy eating He also has a painful callus on the foot he is seeing the podiatrist tomorrow he has had previous partial amputation    Review of Systems     Objective:   Physical Exam  General-in no acute distress Eyes-no discharge Lungs-respiratory rate normal, CTA CV-no murmurs,RRR Extremities skin warm dry no edema Neuro grossly normal Behavior normal, alert Evidence of previous amputation noted callus noted no sign of infection noted      Assessment & Plan:  The patient was seen in followup for chronic pain. A review over at their current pain status was discussed. Drug registry was checked. Prescriptions were given.  Regular follow-up recommended. Discussion was held regarding the importance of compliance with medication as well as pain medication contract.  Patient was informed that medication may cause drowsiness and should not be combined  with other medications/alcohol or street drugs. If the patient feels medication is causing altered alertness then do not drive or operate dangerous equipment.  Drug registry was checked 3 scripts in   The patient was seen today as part of  a comprehensive visit for diabetes. The importance of keeping her A1c at or below 7 was discussed.  Discussed diet, activity, and medication compliance Standard follow-up visit recommended.  Patient aware lack of control and follow-up increases risk of diabetic complications.  HTN- patient seen for follow-up regarding HTN.  Diet, medication compliance, appropriate labs and refills were completed.  Importance of keeping blood pressure under good  control to lessen the risk of complications discussed  No sign of infection but patient was encouraged to watch for foot infection closely  To do with blood work in early July  Patient had cardiology evaluation which showed ejection fraction of 30-35% along with previous defect.  He was felt safe to go ahead and do surgery per cardiology but he does have some risk for CHF if given too many fluids during surgery or shortly thereafter.  Patient states cardiology was sending this information to his orthopedist at [facility]

## 2021-04-16 DIAGNOSIS — E1142 Type 2 diabetes mellitus with diabetic polyneuropathy: Secondary | ICD-10-CM | POA: Diagnosis not present

## 2021-04-16 DIAGNOSIS — M79671 Pain in right foot: Secondary | ICD-10-CM | POA: Diagnosis not present

## 2021-04-25 ENCOUNTER — Other Ambulatory Visit: Payer: Self-pay | Admitting: Family Medicine

## 2021-05-09 NOTE — Progress Notes (Deleted)
Cardiology Office Note   Date:  05/09/2021   ID:  [name], DOB 1956-02-18, MRN [id number]  PCP:  Kathyrn Drown, MD  Cardiologist:   Dorris Carnes, MD   Pt presents in consult for preop risk stratification      History of Present Illness: [name] is a 65 y.o. male with a history of DM with neuropathy,  CAD (s/p BMS to RCA in 2008), PVOD (s/p amutation of R first toe 2016); who is followed by Lance Sell.   Being evaluated for knee surgery   Needs cardiac clearance    The pt denies CP   No SOB with usual activity   Activity is limted some by arthritis   No PND  No orthopnea  No dizziness    I saw the pt in June 2022    No outpatient medications have been marked as taking for the 05/10/21 encounter (Appointment) with Fay Records, MD.     Allergies:   Metformin and related, Statins, and Vicodin [hydrocodone-acetaminophen]   Past Medical History:  Diagnosis Date   CAD (coronary artery disease)    BMS to RCA 2008   Cardiomyopathy [facility])    Depression    DJD (degenerative joint disease)    DM type 2 with diabetic mixed hyperlipidemia ([place]) 12/11/2018   Essential hypertension    Gout    History of inferior wall myocardial infarction 2008   Hypercholesterolemia    Hyperlipidemia    Obesity    Oral candidiasis    Peripheral vascular disease ([place])    Stroke [facility])    December 2019   Type 2 diabetes mellitus [facility])     Past Surgical History:  Procedure Laterality Date   AMPUTATION Right 01/12/2015   Procedure: PARTIAL AMPUTATION 1ST RAY RIGHT FOOT (Amputation of right great toe and portion of the first metatarsal bone right foot);  Surgeon: Caprice Beaver, DPM;  Location: [facility] OR[facility];  Service: Podiatry;  Laterality: Right;   AMPUTATION Right 09/14/2016   Procedure: PARTIAL AMPUTATION RAY 2ND TOE RIGHT FOOT;  Surgeon: Caprice Beaver, DPM;  Location: [facility];  Service: Podiatry;  Laterality: Right;   COLONOSCOPY  08/2008   COLONOSCOPY WITH PROPOFOL N/A 06/02/2020    Procedure: COLONOSCOPY WITH PROPOFOL;  Surgeon: [name], DO;  Location: [facility];  Service: Endoscopy;  Laterality: N/A;  7:30am   ESOPHAGOGASTRODUODENOSCOPY  08/2008   LAMINOTOMY  2001, 2006     Right L4-5 interlaminar laminotomy with excision of herniated   disc with operating microscope.   NECK SURGERY     cervical disc   ORIF WRIST FRACTURE Right    PERIPHERAL VASCULAR CATHETERIZATION N/A 02/27/2015   Procedure: Abdominal Aortogram;  Surgeon: Elam Dutch, MD;  Location: [place] CV LAB;  Service: Cardiovascular;  Laterality: N/A;   PERIPHERAL VASCULAR CATHETERIZATION Bilateral 02/27/2015   Procedure: Lower Extremity Angiography;  Surgeon: Elam Dutch, MD;  Location: [place] CV LAB;  Service: Cardiovascular;  Laterality: Bilateral;   POLYPECTOMY  06/02/2020   Procedure: POLYPECTOMY;  Surgeon: [name], DO;  Location[facility];  Service: Endoscopy;;   Right ankle fracture open reduction internal fixation.  2003   WOUND DEBRIDEMENT Right 12/24/2014   Procedure: DEBRIDEMENT SOFT TISSUE AND BONE RIGHT FOOT;  Surgeon: Caprice Beaver, DPM;  Location: [facility];  Service: Podiatry;  Laterality: Right;     Social History:  The patient  reports that he quit smoking about 50 years ago. His smoking  use included cigarettes. He has a 10.00 pack-year smoking history. He has never used smokeless tobacco. He reports previous drug use. Drug: Marijuana. He reports that he does not drink alcohol.   Family History:  The patient's family history includes Cancer in his sister; Colon cancer in his mother; Diabetes in his father and sister; Heart disease in his sister; Lung cancer in his father.    ROS:  Please see the history of present illness. All other systems are reviewed and  Negative to the above problem except as noted.    PHYSICAL EXAM: VS:  There were no vitals taken for this visit.  GEN: Well nourished, well developed, in no acute distress  HEENT: normal   Neck: no JVD, carotid bruits Cardiac: RRR; no murmurs  Triv LE  edema   Tr PT pulse   Respiratory:  clear to auscultation bilaterally, GI: soft, nontender, nondistended, + BS No RUQ tenderness MS: Moving extremities     Did not examine feet   Skin: warm and dry  Excor lesions on legs   Neuro:  Strength and sensation are intact Psych: euthymic mood, full affect   EKG:  EKG is ordered today.  SR  69   LBBB   Lipid Panel    Component Value Date/Time   CHOL 167 08/25/2020 1034   CHOL 131 06/15/2015 0900   TRIG 89 08/25/2020 1034   HDL 32 (L) 08/25/2020 1034   HDL 33 (L) 06/15/2015 0900   CHOLHDL 5.2 (H) 08/25/2020 1034   VLDL 21 09/19/2018 0501   LDLCALC 116 (H) 08/25/2020 1034      Wt Readings from Last 3 Encounters:  04/15/21 242 lb (109.8 kg)  03/23/21 241 lb (109.3 kg)  02/25/21 245 lb (111.1 kg)      ASSESSMENT AND PLAN:  Preop evaluatoin  2  CAD  Pt with remote intervention to RCA   No symptoms to sugg angina     But he is not that active  And he has DM with neuropathy   WOUld recomm a Lexiscan myovue to r/o inducible ischemai   3  PVOD  S/p amputation Gr toe.  Will get LE dopplers    4  CHF  Chronic systolic    Repeat echo   Overall volume status appears OK   He is not on GDMT    Will review echo    5  HL  Continue lipids     6   Hx CVA  I do not see record     7  Hx DM  Last A1C was 8.5   Admits addiction to sweets   Reviewed dangers   Try to cut back    F/U tentative in 4 months to reassess meds   Current medicines are reviewed at length with the patient today.  The patient does not have concerns regarding medicines.  Signed, Dorris Carnes, MD  05/09/2021 9:21 PM    [address], [place], [place]  [address] Phone: [phone]; Fax: [phone]

## 2021-05-10 ENCOUNTER — Ambulatory Visit: Payer: Medicare HMO | Admitting: Internal Medicine

## 2021-05-13 DIAGNOSIS — M1711 Unilateral primary osteoarthritis, right knee: Secondary | ICD-10-CM | POA: Diagnosis not present

## 2021-05-13 DIAGNOSIS — Z79891 Long term (current) use of opiate analgesic: Secondary | ICD-10-CM | POA: Diagnosis not present

## 2021-05-13 DIAGNOSIS — E1142 Type 2 diabetes mellitus with diabetic polyneuropathy: Secondary | ICD-10-CM | POA: Diagnosis not present

## 2021-05-13 DIAGNOSIS — E782 Mixed hyperlipidemia: Secondary | ICD-10-CM | POA: Diagnosis not present

## 2021-05-13 DIAGNOSIS — E1169 Type 2 diabetes mellitus with other specified complication: Secondary | ICD-10-CM | POA: Diagnosis not present

## 2021-05-14 LAB — BASIC METABOLIC PANEL
BUN: 18 mg/dL (ref 7–25)
CO2: 28 mmol/L (ref 20–32)
Calcium: 9 mg/dL (ref 8.6–10.3)
Chloride: 102 mmol/L (ref 98–110)
Creat: 0.98 mg/dL (ref 0.70–1.35)
Glucose, Bld: 93 mg/dL (ref 65–99)
Potassium: 5 mmol/L (ref 3.5–5.3)
Sodium: 138 mmol/L (ref 135–146)

## 2021-05-14 LAB — HEPATIC FUNCTION PANEL
AG Ratio: 1.9 (calc) (ref 1.0–2.5)
ALT: 10 U/L (ref 9–46)
AST: 13 U/L (ref 10–35)
Albumin: 4.3 g/dL (ref 3.6–5.1)
Alkaline phosphatase (APISO): 64 U/L (ref 35–144)
Bilirubin, Direct: 0.2 mg/dL (ref 0.0–0.2)
Globulin: 2.3 g/dL (calc) (ref 1.9–3.7)
Indirect Bilirubin: 0.6 mg/dL (calc) (ref 0.2–1.2)
Total Bilirubin: 0.8 mg/dL (ref 0.2–1.2)
Total Protein: 6.6 g/dL (ref 6.1–8.1)

## 2021-05-14 LAB — LIPID PANEL
Cholesterol: 124 mg/dL (ref <200)
HDL: 34 mg/dL — ABNORMAL LOW (ref >=40)
LDL Cholesterol (Calc): 77 mg/dL (calc)
Non-HDL Cholesterol (Calc): 90 mg/dL (calc) (ref <130)
Total CHOL/HDL Ratio: 3.6 (calc) (ref <5.0)
Triglycerides: 58 mg/dL (ref <150)

## 2021-05-14 LAB — HEMOGLOBIN A1C
Hgb A1c MFr Bld: 7.7 % of total Hgb — ABNORMAL HIGH (ref <5.7)
Mean Plasma Glucose: 174 mg/dL
eAG (mmol/L): 9.7 mmol/L

## 2021-05-16 ENCOUNTER — Encounter: Payer: Self-pay | Admitting: Family Medicine

## 2021-05-25 DIAGNOSIS — L84 Corns and callosities: Secondary | ICD-10-CM | POA: Diagnosis not present

## 2021-05-25 DIAGNOSIS — I739 Peripheral vascular disease, unspecified: Secondary | ICD-10-CM | POA: Diagnosis not present

## 2021-05-25 DIAGNOSIS — E1142 Type 2 diabetes mellitus with diabetic polyneuropathy: Secondary | ICD-10-CM | POA: Diagnosis not present

## 2021-05-25 DIAGNOSIS — B351 Tinea unguium: Secondary | ICD-10-CM | POA: Diagnosis not present

## 2021-06-09 DIAGNOSIS — Z794 Long term (current) use of insulin: Secondary | ICD-10-CM | POA: Diagnosis not present

## 2021-06-09 DIAGNOSIS — M21061 Valgus deformity, not elsewhere classified, right knee: Secondary | ICD-10-CM | POA: Diagnosis not present

## 2021-06-09 DIAGNOSIS — G8929 Other chronic pain: Secondary | ICD-10-CM | POA: Diagnosis not present

## 2021-06-09 DIAGNOSIS — M1711 Unilateral primary osteoarthritis, right knee: Secondary | ICD-10-CM | POA: Diagnosis not present

## 2021-06-09 DIAGNOSIS — M1990 Unspecified osteoarthritis, unspecified site: Secondary | ICD-10-CM | POA: Diagnosis not present

## 2021-06-09 DIAGNOSIS — L97509 Non-pressure chronic ulcer of other part of unspecified foot with unspecified severity: Secondary | ICD-10-CM | POA: Diagnosis not present

## 2021-06-09 DIAGNOSIS — L409 Psoriasis, unspecified: Secondary | ICD-10-CM | POA: Diagnosis not present

## 2021-06-09 DIAGNOSIS — I890 Lymphedema, not elsewhere classified: Secondary | ICD-10-CM | POA: Diagnosis not present

## 2021-06-09 DIAGNOSIS — E08621 Diabetes mellitus due to underlying condition with foot ulcer: Secondary | ICD-10-CM | POA: Diagnosis not present

## 2021-06-09 DIAGNOSIS — M21751 Unequal limb length (acquired), right femur: Secondary | ICD-10-CM | POA: Diagnosis not present

## 2021-06-09 DIAGNOSIS — M25561 Pain in right knee: Secondary | ICD-10-CM | POA: Diagnosis not present

## 2021-06-29 DIAGNOSIS — E1142 Type 2 diabetes mellitus with diabetic polyneuropathy: Secondary | ICD-10-CM | POA: Diagnosis not present

## 2021-06-29 DIAGNOSIS — E1151 Type 2 diabetes mellitus with diabetic peripheral angiopathy without gangrene: Secondary | ICD-10-CM | POA: Diagnosis not present

## 2021-07-13 ENCOUNTER — Ambulatory Visit (INDEPENDENT_AMBULATORY_CARE_PROVIDER_SITE_OTHER): Payer: Medicare HMO | Admitting: *Deleted

## 2021-07-13 DIAGNOSIS — Z0000 Encounter for general adult medical examination without abnormal findings: Secondary | ICD-10-CM | POA: Diagnosis not present

## 2021-07-13 NOTE — Progress Notes (Signed)
Called patient and spoke with him. He states he would like to r/s appointment, he just woke up and prefers to do it another day

## 2021-07-13 NOTE — Patient Instructions (Signed)
Health Maintenance, Male Adopting a healthy lifestyle and getting preventive care are important in promoting health and wellness. Ask your health care provider about: The right schedule for you to have regular tests and exams. Things you can do on your own to prevent diseases and keep yourself healthy. What should I know about diet, weight, and exercise? Eat a healthy diet  Eat a diet that includes plenty of vegetables, fruits, low-fat dairy products, and lean protein. Do not eat a lot of foods that are high in solid fats, added sugars, or sodium. Maintain a healthy weight Body mass index (BMI) is a measurement that can be used to identify possible weight problems. It estimates body fat based on height and weight. Your health care provider can help determine your BMI and help you achieve or maintain a healthy weight. Get regular exercise Get regular exercise. This is one of the most important things you can do for your health. Most adults should: Exercise for at least 150 minutes each week. The exercise should increase your heart rate and make you sweat (moderate-intensity exercise). Do strengthening exercises at least twice a week. This is in addition to the moderate-intensity exercise. Spend less time sitting. Even light physical activity can be beneficial. Watch cholesterol and blood lipids Have your blood tested for lipids and cholesterol at 66 years of age, then have this test every 5 years. You may need to have your cholesterol levels checked more often if: Your lipid or cholesterol levels are high. You are older than 66 years of age. You are at high risk for heart disease. What should I know about cancer screening? Many types of cancers can be detected early and may often be prevented. Depending on your health history and family history, you may need to have cancer screening at various ages. This may include screening for: Colorectal cancer. Prostate cancer. Skin cancer. Lung  cancer. What should I know about heart disease, diabetes, and high blood pressure? Blood pressure and heart disease High blood pressure causes heart disease and increases the risk of stroke. This is more likely to develop in people who have high blood pressure readings, are of African descent, or are overweight. Talk with your health care provider about your target blood pressure readings. Have your blood pressure checked: Every 3-5 years if you are 18-39 years of age. Every year if you are 40 years old or older. If you are between the ages of 65 and 75 and are a current or former smoker, ask your health care provider if you should have a one-time screening for abdominal aortic aneurysm (AAA). Diabetes Have regular diabetes screenings. This checks your fasting blood sugar level. Have the screening done: Once every three years after age 45 if you are at a normal weight and have a low risk for diabetes. More often and at a younger age if you are overweight or have a high risk for diabetes. What should I know about preventing infection? Hepatitis B If you have a higher risk for hepatitis B, you should be screened for this virus. Talk with your health care provider to find out if you are at risk for hepatitis B infection. Hepatitis C Blood testing is recommended for: Everyone born from 1945 through 1965. Anyone with known risk factors for hepatitis C. Sexually transmitted infections (STIs) You should be screened each year for STIs, including gonorrhea and chlamydia, if: You are sexually active and are younger than 66 years of age. You are older than 66 years   of age and your health care provider tells you that you are at risk for this type of infection. Your sexual activity has changed since you were last screened, and you are at increased risk for chlamydia or gonorrhea. Ask your health care provider if you are at risk. Ask your health care provider about whether you are at high risk for HIV.  Your health care provider may recommend a prescription medicine to help prevent HIV infection. If you choose to take medicine to prevent HIV, you should first get tested for HIV. You should then be tested every 3 months for as long as you are taking the medicine. Follow these instructions at home: Lifestyle Do not use any products that contain nicotine or tobacco, such as cigarettes, e-cigarettes, and chewing tobacco. If you need help quitting, ask your health care provider. Do not use street drugs. Do not share needles. Ask your health care provider for help if you need support or information about quitting drugs. Alcohol use Do not drink alcohol if your health care provider tells you not to drink. If you drink alcohol: Limit how much you have to 0-2 drinks a day. Be aware of how much alcohol is in your drink. In the [place] one drink equals one 12 oz bottle of beer (355 mL), one 5 oz glass of wine (148 mL), or one 1 oz glass of hard liquor (44 mL). General instructions Schedule regular health, dental, and eye exams. Stay current with your vaccines. Tell your health care provider if: You often feel depressed. You have ever been abused or do not feel safe at home. Summary Adopting a healthy lifestyle and getting preventive care are important in promoting health and wellness. Follow your health care provider's instructions about healthy diet, exercising, and getting tested or screened for diseases. Follow your health care provider's instructions on monitoring your cholesterol and blood pressure. This information is not intended to replace advice given to you by your health care provider. Make sure you discuss any questions you have with your health care provider. Document Revised: 12/11/2020 Document Reviewed: 09/26/2018 Elsevier Patient Education  2022 [employer]  

## 2021-07-13 NOTE — Progress Notes (Deleted)
Subjective:   [name] is a 65 y.o. male who presents for Medicare Annual/Subsequent preventive examination.  I connected with  [name] on 07/13/21 by telephone and verified that I am speaking with the correct person using two identifiers.   I discussed the limitations, risks, security and privacy concerns of performing an evaluation and management service by telephone and the availability of in person appointments. The patient expressed understanding and agreed to proceed  Location of patient: Home Location of Provider: Telephone Persons participating in Virtual Visit: [name](patient), Sara Selvidge (RMA) Review of Systems    N/A       Objective:    There were no vitals filed for this visit. There is no height or weight on file to calculate BMI.  Advanced Directives 06/02/2020 12/17/2018 09/18/2018 09/18/2018 09/13/2016 02/27/2015 01/12/2015  Does Patient Have a Medical Advance Directive? No No No No No No No  Would patient like information on creating a medical advance directive? No - Patient declined No - Patient declined No - Patient declined - No - Patient declined No - patient declined information No - patient declined information    Current Medications (verified) Outpatient Encounter Medications as of 07/13/2021  Medication Sig   aspirin 325 MG tablet Take 325 mg by mouth every evening.    atorvastatin (LIPITOR) 10 MG tablet TAKE ONE TABLET BY MOUTH ON MONDAY AND FRIDAY   B-D ULTRAFINE III SHORT PEN 31G X 8 MM MISC USE PEN NEEDLES DAILY WITH LANTUS   betamethasone valerate ointment (VALISONE) 0.1 % APPLY 1 APPLICATION TOPICALLY 2 (TWO) TIMES DAILY AS NEEDED. (Patient taking differently: Apply 1 application topically 2 (two) times daily as needed (Psoriasis).)   furosemide (LASIX) 20 MG tablet Take 20 mg by mouth daily as needed. For fluid retention   insulin detemir (LEVEMIR FLEXTOUCH) 100 UNIT/ML FlexPen INJECT 54 UNITS AT BEDTIME - MAY TITRATE UP TO 70  UNITS INTO SKIN AT BEDTIME FOR DIABETES   KLOR-CON M20 20 MEQ tablet TAKE 1 TABLET (20 MEQ TOTAL) BY MOUTH DAILY. WHEN TAKING LASIX (Patient taking differently: Take 20 mEq by mouth daily as needed (With Furosemide).)   lisinopril-hydrochlorothiazide (ZESTORETIC) 20-25 MG tablet Take 1 tablet by mouth daily.   metFORMIN (GLUCOPHAGE-XR) 500 MG 24 hr tablet TAKE 1 TABLET BY MOUTH EVERY DAY WITH BREAKFAST   Multiple Vitamins-Minerals (MULTIVITAMIN WITH MINERALS) tablet Take 1 tablet by mouth daily.   Omega-3 Fatty Acids (FISH OIL PO) Take 1 tablet by mouth 3 (three) times daily.    oxyCODONE-acetaminophen (PERCOCET) 10-325 MG tablet Take one tablet by mouth every 6 hours prn pain   oxyCODONE-acetaminophen (PERCOCET) 10-325 MG tablet Take one tablet by mouth every 6 hours prn pain   oxyCODONE-acetaminophen (PERCOCET) 10-325 MG tablet Take one tablet by mouth every 6 hours prn pain   No facility-administered encounter medications on file as of 07/13/2021.    Allergies (verified) Metformin and related, Statins, and Vicodin [hydrocodone-acetaminophen]   History: Past Medical History:  Diagnosis Date   CAD (coronary artery disease)    BMS to RCA 2008   Cardiomyopathy [facility])    Depression    DJD (degenerative joint disease)    DM type 2 with diabetic mixed hyperlipidemia ([place]) 12/11/2018   Essential hypertension    Gout    History of inferior wall myocardial infarction 2008   Hypercholesterolemia    Hyperlipidemia    Obesity    Oral candidiasis    Peripheral vascular disease ([place])  Stroke [facility])    December 2019   Type 2 diabetes mellitus [facility])    Past Surgical History:  Procedure Laterality Date   AMPUTATION Right 01/12/2015   Procedure: PARTIAL AMPUTATION 1ST RAY RIGHT FOOT (Amputation of right great toe and portion of the first metatarsal bone right foot);  Surgeon: Caprice Beaver, DPM;  Location: [facility];  Service: Podiatry;  Laterality: Right;   AMPUTATION Right 09/14/2016    Procedure: PARTIAL AMPUTATION RAY 2ND TOE RIGHT FOOT;  Surgeon: Caprice Beaver, DPM;  Location: [facility];  Service: Podiatry;  Laterality: Right;   COLONOSCOPY  08/2008   COLONOSCOPY WITH PROPOFOL N/A 06/02/2020   Procedure: COLONOSCOPY WITH PROPOFOL;  Surgeon: [name], DO;  Location: [facility];  Service: Endoscopy;  Laterality: N/A;  7:30am   ESOPHAGOGASTRODUODENOSCOPY  08/2008   LAMINOTOMY  2001, 2006     Right L4-5 interlaminar laminotomy with excision of herniated   disc with operating microscope.   NECK SURGERY     cervical disc   ORIF WRIST FRACTURE Right    PERIPHERAL VASCULAR CATHETERIZATION N/A 02/27/2015   Procedure: Abdominal Aortogram;  Surgeon: Elam Dutch, MD;  Location: [place] CV LAB;  Service: Cardiovascular;  Laterality: N/A;   PERIPHERAL VASCULAR CATHETERIZATION Bilateral 02/27/2015   Procedure: Lower Extremity Angiography;  Surgeon: Elam Dutch, MD;  Location: [place] CV LAB;  Service: Cardiovascular;  Laterality: Bilateral;   POLYPECTOMY  06/02/2020   Procedure: POLYPECTOMY;  Surgeon: [name], DO;  Location: [facility];  Service: Endoscopy;;   Right ankle fracture open reduction internal fixation.  2003   WOUND DEBRIDEMENT Right 12/24/2014   Procedure: DEBRIDEMENT SOFT TISSUE AND BONE RIGHT FOOT;  Surgeon: Caprice Beaver, DPM;  Location: [facility];  Service: Podiatry;  Laterality: Right;   Family History  Problem Relation Age of Onset   Colon cancer Mother    Diabetes Father    Lung cancer Father    Cancer Sister    Diabetes Sister    Heart disease Sister        before age 91   Social History   Socioeconomic History   Marital status: Married    Spouse name: Not on file   Number of children: Not on file   Years of education: Not on file   Highest education level: Not on file  Occupational History   Not on file  Tobacco Use   Smoking status: Former    Packs/day: 1.00    Years: 10.00    Pack years: 10.00    Types:  Cigarettes    Quit date: 12/23/1970    Years since quitting: 50.5   Smokeless tobacco: Never   Tobacco comments:    quit in 1979  Vaping Use   Vaping Use: Never used  Substance and Sexual Activity   Alcohol use: No    Alcohol/week: 0.0 standard drinks   Drug use: Not Currently    Types: Marijuana    Comment: in the past   Sexual activity: Yes    Birth control/protection: None  Other Topics Concern   Not on file  Social History Narrative    He is married and has 3 kids.  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  He denies any tobacco or alcohol use.    Social Determinants of Health   Financial Resource Strain: Not on file  Food Insecurity: Not on file  Transportation Needs: Not on file  Physical  Activity: Not on file  Stress: Not on file  Social Connections: Not on file    Tobacco Counseling Counseling given: Not Answered Tobacco comments: quit in 1979   Clinical Intake:                 Diabetic?***         Activities of Daily Living No flowsheet data found.  Patient Care Team: Kathyrn Drown, MD as PCP - General (Family Medicine) Fay Records, MD as PCP - Cardiology (Cardiology) Allene Pyo, DPM as Consulting Physician (Podiatry) Marlou Sa, Tonna Corner, MD as Consulting Physician (Orthopedic Surgery) Karie Chimera, MD as Consulting Physician (Neurosurgery) Caprice Beaver, DPM as Consulting Physician (Podiatry) Phillips Odor, MD as Consulting Physician (Neurology)  Indicate any recent [place] you may have received from other than [facility] providers in the past year (date may be approximate).     Assessment:   This is a routine wellness examination for Ori.  Hearing/Vision screen No results found.  Dietary issues and exercise activities discussed:     Goals Addressed   None   Depression Screen PHQ 2/9 Scores 02/25/2021 12/30/2020 12/30/2020 09/15/2020 08/24/2017 08/24/2017 08/24/2017  PHQ - 2 Score 0 0 0 0 0 2 2   PHQ- 9 Score - - - - - 9 -    Fall Risk Fall Risk  02/25/2021 12/30/2020 09/15/2020 06/15/2020 02/05/2020  Falls in the past year? 0 0 0 1 0  Number falls in past yr: - - - 1 -  Injury with Fall? - - - 1 -  Risk for fall due to : - - Impaired balance/gait - -  Follow up Falls evaluation completed Falls evaluation completed Falls evaluation completed Falls evaluation completed Falls evaluation completed    [place]:  Any stairs in or around the home? {YES/NO:21197} If so, are there any without handrails? {YES/NO:21197} Home free of loose throw rugs in walkways, pet beds, electrical cords, etc? {YES/NO:21197} Adequate lighting in your home to reduce risk of falls? {YES/NO:21197}  ASSISTIVE DEVICES UTILIZED TO PREVENT FALLS:  Life alert? {YES/NO:21197} Use of a cane, walker or w/c? {YES/NO:21197} Grab bars in the bathroom? {YES/NO:21197} Shower chair or bench in shower? {YES/NO:21197} Elevated toilet seat or a handicapped toilet? {YES/NO:21197}  TIMED UP AND GO:  Was the test performed? {YES/NO:21197}.  Length of time to ambulate 10 feet: *** sec.   {Appearance of BJSE:8315176}  Cognitive Function:        Immunizations Immunization History  Administered Date(s) Administered   Fluad Quad(high Dose 65+) 09/15/2020   Influenza Split 08/26/2013   Influenza,inj,Quad PF,6+ Mos 09/16/2014, 08/24/2015, 08/24/2016, 08/24/2017, 09/25/2018, 07/20/2019   Moderna SARS-COV2 Booster Vaccination 09/24/2020   Moderna Sars-Covid-2 Vaccination 12/20/2019, 01/21/2020   Pneumococcal Polysaccharide-23 08/26/2013, 04/07/2020   Td 05/29/2018    {TDAP status:2101805}  {Flu Vaccine status:2101806}  {Pneumococcal vaccine status:2101807}  {Covid-19 vaccine status:2101808}  Qualifies for Shingles Vaccine? {YES/NO:21197}  Zostavax completed {YES/NO:21197}  {Shingrix Completed?:2101804}  Screening Tests Health Maintenance  Topic Date Due   Zoster  Vaccines- Shingrix (1 of 2) Never done   OPHTHALMOLOGY EXAM  01/16/2019   COVID-19 Vaccine (4 - Booster for Moderna series) 12/17/2020   INFLUENZA VACCINE  05/17/2021   HEMOGLOBIN A1C  11/13/2021   FOOT EXAM  02/25/2022   TETANUS/TDAP  05/29/2028   COLONOSCOPY (Pts 45-57yrs Insurance coverage will need to be confirmed)  06/02/2030   Hepatitis C Screening  Completed   HPV VACCINES  Aged Out  Health Maintenance  Health Maintenance Due  Topic Date Due   Zoster Vaccines- Shingrix (1 of 2) Never done   OPHTHALMOLOGY EXAM  01/16/2019   COVID-19 Vaccine (4 - Booster for Moderna series) 12/17/2020   INFLUENZA VACCINE  05/17/2021    {Colorectal cancer screening:2101809}  Lung Cancer Screening: (Low Dose CT Chest recommended if Age 54-80 years, 30 pack-year currently smoking OR have quit w/in 15years.) {DOES NOT does:27190::"does not"} qualify.   Lung Cancer Screening Referral: ***  Additional Screening:  Hepatitis C Screening: {DOES NOT does:27190::"does not"} qualify; Completed ***  Vision Screening: Recommended annual ophthalmology exams for early detection of glaucoma and other disorders of the eye. Is the patient up to date with their annual eye exam?  {YES/NO:21197} Who is the provider or what is the name of the office in which the patient attends annual eye exams? *** If pt is not established with a provider, would they like to be referred to a provider to establish care? {YES/NO:21197}.   Dental Screening: Recommended annual dental exams for proper oral hygiene  Community Resource Referral / Chronic Care Management: CRR required this visit?  {YES/NO:21197}  CCM required this visit?  {YES/NO:21197}     Plan:     I have personally reviewed and noted the following in the patient's chart:   Medical and social history Use of alcohol, tobacco or illicit drugs  Current medications and supplements including opioid prescriptions. {Opioid  Prescriptions:[phone]} Functional ability and status Nutritional status Physical activity Advanced directives List of other physicians Hospitalizations, surgeries, and ER visits in previous 12 months Vitals Screenings to include cognitive, depression, and falls Referrals and appointments  In addition, I have reviewed and discussed with patient certain preventive protocols, quality metrics, and best practice recommendations. A written personalized care plan for preventive services as well as general preventive health recommendations were provided to patient.     Beverlyann Broxterman [place], [place]   07/13/2021   Nurse Notes: non face to face 1 hr visit

## 2021-07-16 ENCOUNTER — Encounter: Payer: Self-pay | Admitting: Family Medicine

## 2021-07-16 ENCOUNTER — Ambulatory Visit: Payer: Medicare HMO | Admitting: Family Medicine

## 2021-07-19 ENCOUNTER — Other Ambulatory Visit: Payer: Self-pay | Admitting: Family Medicine

## 2021-07-19 ENCOUNTER — Telehealth: Payer: Self-pay | Admitting: Family Medicine

## 2021-07-19 DIAGNOSIS — E1142 Type 2 diabetes mellitus with diabetic polyneuropathy: Secondary | ICD-10-CM

## 2021-07-19 DIAGNOSIS — M1711 Unilateral primary osteoarthritis, right knee: Secondary | ICD-10-CM

## 2021-07-19 DIAGNOSIS — Z125 Encounter for screening for malignant neoplasm of prostate: Secondary | ICD-10-CM

## 2021-07-19 DIAGNOSIS — Z79899 Other long term (current) drug therapy: Secondary | ICD-10-CM

## 2021-07-19 DIAGNOSIS — Z79891 Long term (current) use of opiate analgesic: Secondary | ICD-10-CM

## 2021-07-19 DIAGNOSIS — E1169 Type 2 diabetes mellitus with other specified complication: Secondary | ICD-10-CM

## 2021-07-19 MED ORDER — OXYCODONE-ACETAMINOPHEN 10-325 MG PO TABS: ORAL_TABLET | ORAL | 0 refills | Status: DC

## 2021-07-19 NOTE — Telephone Encounter (Signed)
Patient was unable to keep his appointment on 9/30. Stated he was sick, feel asleep and did not wake until after appointment time. He has been reschedule for 10/25 for a medication follow up. Stated he still needed a refill on his pain meds. Also stated he wanted to get established with Dr. Dorris Fetch to get his A1C under control.   [facility]  CB#  [phone]

## 2021-07-19 NOTE — Telephone Encounter (Signed)
1.  I sent in the refill #2 patient to do met 7 and PSA before his follow-up visit we will review all labs at his visit, HTN, screening PSA #3 important to keep follow-up visit #4 referral to Dr. Dorris Fetch because of diabetes

## 2021-07-19 NOTE — Telephone Encounter (Signed)
Blood work and referral ordered in [employer]. Left message to return call

## 2021-07-20 NOTE — Telephone Encounter (Signed)
Patient notified and verbalized understanding. 

## 2021-08-10 ENCOUNTER — Ambulatory Visit (INDEPENDENT_AMBULATORY_CARE_PROVIDER_SITE_OTHER): Payer: Medicare HMO | Admitting: Family Medicine

## 2021-08-10 ENCOUNTER — Other Ambulatory Visit: Payer: Self-pay

## 2021-08-10 ENCOUNTER — Encounter: Payer: Self-pay | Admitting: Family Medicine

## 2021-08-10 VITALS — BP 132/84 | Temp 99.5°F | Wt 239.8 lb

## 2021-08-10 DIAGNOSIS — E782 Mixed hyperlipidemia: Secondary | ICD-10-CM | POA: Diagnosis not present

## 2021-08-10 DIAGNOSIS — E1142 Type 2 diabetes mellitus with diabetic polyneuropathy: Secondary | ICD-10-CM

## 2021-08-10 DIAGNOSIS — E1169 Type 2 diabetes mellitus with other specified complication: Secondary | ICD-10-CM | POA: Diagnosis not present

## 2021-08-10 DIAGNOSIS — Z23 Encounter for immunization: Secondary | ICD-10-CM

## 2021-08-10 DIAGNOSIS — M1711 Unilateral primary osteoarthritis, right knee: Secondary | ICD-10-CM | POA: Diagnosis not present

## 2021-08-10 DIAGNOSIS — Z79891 Long term (current) use of opiate analgesic: Secondary | ICD-10-CM | POA: Diagnosis not present

## 2021-08-10 MED ORDER — METFORMIN HCL ER 500 MG PO TB24: ORAL_TABLET | ORAL | 1 refills | Status: DC

## 2021-08-10 MED ORDER — OXYCODONE-ACETAMINOPHEN 10-325 MG PO TABS: ORAL_TABLET | ORAL | 0 refills | Status: DC

## 2021-08-10 NOTE — Progress Notes (Signed)
Subjective:    Patient ID: [name], male    DOB: Oct 05, 1956, 66 y.o.   MRN: [id number]  HPI This patient was seen today for chronic pain  The medication list was reviewed and updated.  Location of Pain for which the patient has been treated with regarding narcotics:   Onset of this pain: He has had the pain for greater than 10 years   -Compliance with medication: Oxycodone 10-325 mg  - Number patient states they take daily: 4  -when was the last dose patient took? Sometime during the night  The patient was advised the importance of maintaining medication and not using illegal substances with these.  Here for refills and follow up  The patient was educated that we can provide 3 monthly scripts for their medication, it is their responsibility to follow the instructions.  Side effects or complications from medications: none  Patient is aware that pain medications are meant to minimize the severity of the pain to allow their pain levels to improve to allow for better function. They are aware of that pain medications cannot totally remove their pain.  Due for UDT ( at least once per year) : last UDT 04/15/21  Scale of 1 to 10 ( 1 is least 10 is most) Your pain level without the medicine: 8 Your pain level with medication: 4  Scale 1 to 10 ( 1-helps very little, 10 helps very well) How well does your pain medication reduce your pain so you can function better through out the day? 10  Quality of the pain: Throbbing pain in his knees  Persistence of the pain: Present all the time  Modifying factors: Worse with activity        Review of Systems     Objective:   Physical Exam  General-in no acute distress Eyes-no discharge Lungs-respiratory rate normal, CTA CV-no murmurs,RRR Extremities skin warm dry no edema Neuro grossly normal Behavior normal, alert       Assessment & Plan:  1. Need for vaccination Flu shot today - Flu Vaccine QUAD High  Dose(Fluad)  2. DM type 2 with diabetic mixed hyperlipidemia ([place]) Diabetes needs to be under better control bump up dose of metformin patient will be seeing endocrinology soon would like to get A1c around the 7 for his knee surgery - metFORMIN (GLUCOPHAGE-XR) 500 MG 24 hr tablet; Take two tablets po daily  Dispense: 180 tablet; Refill: 1  3. Primary osteoarthritis of right knee Will have knee surgery in the near future - oxyCODONE-acetaminophen (PERCOCET) 10-325 MG tablet; Take one tablet by mouth every 6 hours prn pain  Dispense: 120 tablet; Refill: 0 - oxyCODONE-acetaminophen (PERCOCET) 10-325 MG tablet; Take one tablet by mouth every 6 hours prn pain  Dispense: 120 tablet; Refill: 0 - oxyCODONE-acetaminophen (PERCOCET) 10-325 MG tablet; Take one tablet by mouth every 6 hours prn pain  Dispense: 120 tablet; Refill: 0  4. Diabetic peripheral neuropathy (HCC) No ulcer seen on feet today - oxyCODONE-acetaminophen (PERCOCET) 10-325 MG tablet; Take one tablet by mouth every 6 hours prn pain  Dispense: 120 tablet; Refill: 0 - oxyCODONE-acetaminophen (PERCOCET) 10-325 MG tablet; Take one tablet by mouth every 6 hours prn pain  Dispense: 120 tablet; Refill: 0 - oxyCODONE-acetaminophen (PERCOCET) 10-325 MG tablet; Take one tablet by mouth every 6 hours prn pain  Dispense: 120 tablet; Refill: 0  5. Encounter for long-term opiate analgesic use The patient was seen in followup for chronic pain. A review over at their current  pain status was discussed. Drug registry was checked. Prescriptions were given.  Regular follow-up recommended. Discussion was held regarding the importance of compliance with medication as well as pain medication contract.  Patient was informed that medication may cause drowsiness and should not be combined  with other medications/alcohol or street drugs. If the patient feels medication is causing altered alertness then do not drive or operate dangerous equipment. Patient will  do lab work that has been ordered through [place] [facility] (PERCOCET) 10-325 MG tablet; Take one tablet by mouth every 6 hours prn pain  Dispense: 120 tablet; Refill: 0 - oxyCODONE-acetaminophen (PERCOCET) 10-325 MG tablet; Take one tablet by mouth every 6 hours prn pain  Dispense: 120 tablet; Refill: 0 - oxyCODONE-acetaminophen (PERCOCET) 10-325 MG tablet; Take one tablet by mouth every 6 hours prn pain  Dispense: 120 tablet; Refill: 0

## 2021-08-10 NOTE — Patient Instructions (Signed)

## 2021-08-20 DIAGNOSIS — I739 Peripheral vascular disease, unspecified: Secondary | ICD-10-CM | POA: Diagnosis not present

## 2021-08-20 DIAGNOSIS — E1142 Type 2 diabetes mellitus with diabetic polyneuropathy: Secondary | ICD-10-CM | POA: Diagnosis not present

## 2021-08-20 DIAGNOSIS — B351 Tinea unguium: Secondary | ICD-10-CM | POA: Diagnosis not present

## 2021-08-20 DIAGNOSIS — L84 Corns and callosities: Secondary | ICD-10-CM | POA: Diagnosis not present

## 2021-08-25 ENCOUNTER — Encounter: Payer: Self-pay | Admitting: "Endocrinology

## 2021-08-25 ENCOUNTER — Ambulatory Visit (INDEPENDENT_AMBULATORY_CARE_PROVIDER_SITE_OTHER): Payer: Medicare HMO | Admitting: "Endocrinology

## 2021-08-25 ENCOUNTER — Other Ambulatory Visit: Payer: Self-pay

## 2021-08-25 VITALS — BP 130/78 | HR 72 | Ht 75.0 in | Wt 242.2 lb

## 2021-08-25 DIAGNOSIS — E669 Obesity, unspecified: Secondary | ICD-10-CM | POA: Diagnosis not present

## 2021-08-25 DIAGNOSIS — Z6830 Body mass index (BMI) 30.0-30.9, adult: Secondary | ICD-10-CM

## 2021-08-25 DIAGNOSIS — E1159 Type 2 diabetes mellitus with other circulatory complications: Secondary | ICD-10-CM

## 2021-08-25 DIAGNOSIS — E782 Mixed hyperlipidemia: Secondary | ICD-10-CM

## 2021-08-25 DIAGNOSIS — Z7984 Long term (current) use of oral hypoglycemic drugs: Secondary | ICD-10-CM | POA: Diagnosis not present

## 2021-08-25 DIAGNOSIS — I10 Essential (primary) hypertension: Secondary | ICD-10-CM

## 2021-08-25 DIAGNOSIS — Z89431 Acquired absence of right foot: Secondary | ICD-10-CM | POA: Diagnosis not present

## 2021-08-25 DIAGNOSIS — Z79899 Other long term (current) drug therapy: Secondary | ICD-10-CM | POA: Diagnosis not present

## 2021-08-25 DIAGNOSIS — Z125 Encounter for screening for malignant neoplasm of prostate: Secondary | ICD-10-CM | POA: Diagnosis not present

## 2021-08-25 DIAGNOSIS — E1169 Type 2 diabetes mellitus with other specified complication: Secondary | ICD-10-CM | POA: Diagnosis not present

## 2021-08-25 LAB — POCT GLYCOSYLATED HEMOGLOBIN (HGB A1C): HbA1c, POC (controlled diabetic range): 7.4 % — AB (ref 0.0–7.0)

## 2021-08-25 MED ORDER — LEVEMIR FLEXTOUCH 100 UNIT/ML ~~LOC~~ SOPN
50.0000 [IU] | PEN_INJECTOR | Freq: Every day | SUBCUTANEOUS | 1 refills | Status: DC
Start: 2021-08-25 — End: 2021-12-27

## 2021-08-25 MED ORDER — METFORMIN HCL ER 500 MG PO TB24: 500.0000 mg | ORAL_TABLET | Freq: Every day | ORAL | 1 refills | Status: DC

## 2021-08-25 NOTE — Progress Notes (Signed)
Endocrinology Consult Note       08/25/2021, 5:23 PM   Subjective:    Patient ID: [name], male    DOB: [birth year].  [name] is being seen in consultation for management of currently uncontrolled symptomatic diabetes requested by  Kathyrn Drown, MD.   Past Medical History:  Diagnosis Date   CAD (coronary artery disease)    BMS to RCA 2008   Cardiomyopathy [facility])    Depression    DJD (degenerative joint disease)    DM type 2 with diabetic mixed hyperlipidemia ([place]) 12/11/2018   Essential hypertension    Gout    History of inferior wall myocardial infarction 2008   Hypercholesterolemia    Hyperlipidemia    Obesity    Oral candidiasis    Peripheral vascular disease ([place])    Stroke [facility])    December 2019   Type 2 diabetes mellitus [facility])     Past Surgical History:  Procedure Laterality Date   AMPUTATION Right 01/12/2015   Procedure: PARTIAL AMPUTATION 1ST RAY RIGHT FOOT (Amputation of right great toe and portion of the first metatarsal bone right foot);  Surgeon: Caprice Beaver, DPM;  Location: [facility];  Service: Podiatry;  Laterality: Right;   AMPUTATION Right 09/14/2016   Procedure: PARTIAL AMPUTATION RAY 2ND TOE RIGHT FOOT;  Surgeon: Caprice Beaver, DPM;  Location: [facility];  Service: Podiatry;  Laterality: Right;   COLONOSCOPY  08/2008   COLONOSCOPY WITH PROPOFOL N/A 06/02/2020   Procedure: COLONOSCOPY WITH PROPOFOL;  Surgeon: [name], DO;  Location: [facility];  Service: Endoscopy;  Laterality: N/A;  7:30am   ESOPHAGOGASTRODUODENOSCOPY  08/2008   LAMINOTOMY  2001, 2006     Right L4-5 interlaminar laminotomy with excision of herniated   disc with operating microscope.   NECK SURGERY     cervical disc   ORIF WRIST FRACTURE Right    PERIPHERAL VASCULAR CATHETERIZATION N/A 02/27/2015   Procedure: Abdominal Aortogram;  Surgeon: Elam Dutch, MD;  Location: [place] [facility];  Service: Cardiovascular;  Laterality: N/A;   PERIPHERAL VASCULAR CATHETERIZATION Bilateral 02/27/2015   Procedure: Lower Extremity Angiography;  Surgeon: Elam Dutch, MD;  Location: [place] [facility];  Service: Cardiovascular;  Laterality: Bilateral;   POLYPECTOMY  06/02/2020   Procedure: POLYPECTOMY;  Surgeon: [name], DO;  Location: [facility];  Service: Endoscopy;;   Right ankle fracture open reduction internal fixation.  2003   WOUND DEBRIDEMENT Right 12/24/2014   Procedure: DEBRIDEMENT SOFT TISSUE AND BONE RIGHT FOOT;  Surgeon: Caprice Beaver, DPM;  Location: [facility];  Service: Podiatry;  Laterality: Right;    Social History   Socioeconomic History   Marital status: Married    Spouse name: Not on file   Number of children: Not on file   Years of education: Not on file   Highest education level: Not on file  Occupational History   Not on file  Tobacco Use   Smoking status: Former    Packs/day: 1.00    Years: 10.00    Pack years: 10.00    Types: Cigarettes  Quit date: 12/23/1970    Years since quitting: 50.7   Smokeless tobacco: Never   Tobacco comments:    quit in 1979  Vaping Use   Vaping Use: Never used  Substance and Sexual Activity   Alcohol use: No    Alcohol/week: 0.0 standard drinks   Drug use: Yes    Types: Marijuana    Comment: in the past   Sexual activity: Yes    Birth control/protection: None  Other Topics Concern   Not on file  Social History Narrative    He is married and has 3 kids.  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  He denies any tobacco or alcohol use.    Social Determinants of Health   Financial Resource Strain: Not on file  Food Insecurity: Not on file  Transportation Needs: Not on file  Physical Activity: Not on file  Stress: Not on file  Social Connections: Not on file    Family History  Problem Relation Age of Onset   Diabetes Mother    Colon cancer Mother    Diabetes Father     Lung cancer Father    Cancer Sister    Diabetes Sister    Heart disease Sister        before age 56    Outpatient Encounter Medications as of 08/25/2021  Medication Sig   ergocalciferol (VITAMIN D2) 1.25 MG (50000 UT) capsule Take 1 capsule by mouth once a week.   aspirin 325 MG tablet Take 325 mg by mouth every evening.    atorvastatin (LIPITOR) 10 MG tablet TAKE ONE TABLET BY MOUTH ON MONDAY AND FRIDAY   B-D ULTRAFINE III SHORT PEN 31G X 8 MM MISC USE PEN NEEDLES DAILY WITH LANTUS   betamethasone valerate ointment (VALISONE) 0.1 % APPLY 1 APPLICATION TOPICALLY 2 (TWO) TIMES DAILY AS NEEDED. (Patient taking differently: Apply 1 application topically 2 (two) times daily as needed (Psoriasis).)   furosemide (LASIX) 20 MG tablet Take 20 mg by mouth daily as needed. For fluid retention   insulin detemir (LEVEMIR FLEXTOUCH) 100 UNIT/ML FlexPen Inject 50 Units into the skin at bedtime.   KLOR-CON M20 20 MEQ tablet TAKE 1 TABLET (20 MEQ TOTAL) BY MOUTH DAILY. WHEN TAKING LASIX (Patient taking differently: Take 20 mEq by mouth daily as needed (With Furosemide).)   lisinopril-hydrochlorothiazide (ZESTORETIC) 20-25 MG tablet Take 1 tablet by mouth daily.   meloxicam (MOBIC) 15 MG tablet Take 15 mg by mouth daily.   metFORMIN (GLUCOPHAGE-XR) 500 MG 24 hr tablet Take 1 tablet (500 mg total) by mouth daily with breakfast.   Multiple Vitamins-Minerals (MULTIVITAMIN WITH MINERALS) tablet Take 1 tablet by mouth daily.   Omega-3 Fatty Acids (FISH OIL PO) Take 1 tablet by mouth daily.   oxyCODONE-acetaminophen (PERCOCET) 10-325 MG tablet Take one tablet by mouth every 6 hours prn pain   oxyCODONE-acetaminophen (PERCOCET) 10-325 MG tablet Take one tablet by mouth every 6 hours prn pain   oxyCODONE-acetaminophen (PERCOCET) 10-325 MG tablet Take one tablet by mouth every 6 hours prn pain   [DISCONTINUED] insulin detemir (LEVEMIR FLEXTOUCH) 100 UNIT/ML FlexPen INJECT 54 UNITS AT BEDTIME - MAY TITRATE UP TO 70  UNITS INTO SKIN AT BEDTIME FOR DIABETES   [DISCONTINUED] metFORMIN (GLUCOPHAGE-XR) 500 MG 24 hr tablet Take two tablets po daily (Patient taking differently: 500 mg. Take two tablets po daily)   No facility-administered encounter medications on file as of 08/25/2021.    ALLERGIES: Allergies  Allergen Reactions   Metformin And Related     GI symptoms   Statins Other (See Comments)    Legs ache, he states Crestor cause shoulder pain, states Crestor caused rash on hands   Vicodin [Hydrocodone-Acetaminophen] Nausea Only    VACCINATION STATUS: Immunization History  Administered Date(s) Administered   Fluad Quad(high Dose 65+) 09/15/2020, 08/10/2021   Influenza Split 08/26/2013   Influenza,inj,Quad PF,6+ Mos 09/16/2014, 08/24/2015, 08/24/2016, 08/24/2017, 09/25/2018, 07/20/2019   Moderna SARS-COV2 Booster Vaccination 09/24/2020   Moderna Sars-Covid-2 Vaccination 12/20/2019, 01/21/2020   Pneumococcal Polysaccharide-23 08/26/2013, 04/07/2020   Td 05/29/2018    Diabetes He presents for his initial diabetic visit. He has type 2 diabetes mellitus. Onset time: He was diagnosed at approximate age of 69 years. His disease course has been improving. There are no hypoglycemic associated symptoms. Pertinent negatives for hypoglycemia include no confusion, headaches, pallor or seizures. There are no diabetic associated symptoms. Pertinent negatives for diabetes include no chest pain, no fatigue, no polydipsia, no polyphagia, no polyuria and no weakness. There are no hypoglycemic complications. Symptoms are improving. Diabetic complications include a CVA, peripheral neuropathy and PVD. Risk factors for coronary artery disease include diabetes mellitus, dyslipidemia, hypertension, male sex, sedentary lifestyle, tobacco exposure, obesity and family history. Current diabetic treatments: He was supposed to be on Levemir 54 units nightly, however patient admits that he is not taking this treatment daily,  admittedly skipping a day between his injections.  He is also on metformin 500 mg p.o. daily. His weight is fluctuating minimally. He is following a generally unhealthy diet. When asked about meal planning, he reported none. He has not had a previous visit with a dietitian. He never participates in exercise. (He did not bring any logs nor meter to review.  His point-of-care A1c is 7.4%, progressively improving from his previous readings.  This patient was seen approximately 10 years ago this clinic, did not return for follow-up.  He did have A1c close to 10% at that time.  He is motivated to get hip surgery and reportedly his surgeon wants to see A1c under 7%.) An ACE inhibitor/angiotensin II receptor blocker is being taken.  Hyperlipidemia This is a chronic problem. The current episode started more than 1 year ago. Exacerbating diseases include diabetes and obesity. Pertinent negatives include no chest pain, myalgias or shortness of breath. Current antihyperlipidemic treatment includes statins. Risk factors for coronary artery disease include diabetes mellitus, dyslipidemia, family history, obesity, male sex, hypertension and a sedentary lifestyle.  Hypertension This is a chronic problem. The current episode started more than 1 year ago. Pertinent negatives include no chest pain, headaches, neck pain, palpitations or shortness of breath. Risk factors for coronary artery disease include dyslipidemia, diabetes mellitus, family history, obesity, male gender, sedentary lifestyle and smoking/tobacco exposure. Past treatments include ACE inhibitors. Hypertensive end-organ damage includes CVA and PVD.    Review of Systems  Constitutional:  Negative for chills, fatigue, fever and unexpected weight change.  HENT:  Negative for dental problem, mouth sores and trouble swallowing.   Eyes:  Negative for visual disturbance.  Respiratory:  Negative for cough, choking, chest tightness, shortness of breath and wheezing.    Cardiovascular:  Negative for chest pain, palpitations and leg swelling.  Gastrointestinal:  Negative for abdominal distention, abdominal pain, constipation, diarrhea, nausea and vomiting.  Endocrine: Negative for polydipsia, polyphagia and polyuria.  Genitourinary:  Negative for dysuria, flank pain, hematuria and urgency.  Musculoskeletal:  Positive for gait problem. Negative for back pain, myalgias and neck  pain.  Skin:  Negative for pallor, rash and wound.  Neurological:  Negative for seizures, syncope, weakness, numbness and headaches.  Psychiatric/Behavioral:  Negative for confusion and dysphoric mood.    Objective:    Vitals with BMI 08/25/2021 08/10/2021 04/15/2021  Height 6\' 3"  - 6\' 3"   Weight 242 lbs 3 oz 239 lbs 13 oz 242 lbs  BMI 96.78 - 93.81  Systolic 017 510 258  Diastolic 78 84 78  Pulse 72 - 81    BP 130/78   Pulse 72   Ht 6\' 3"  (1.905 m)   Wt 242 lb 3.2 oz (109.9 kg)   BMI 30.27 kg/m   Wt Readings from Last 3 Encounters:  08/25/21 242 lb 3.2 oz (109.9 kg)  08/10/21 239 lb 12.8 oz (108.8 kg)  04/15/21 242 lb (109.8 kg)     Physical Exam Constitutional:      General: He is not in acute distress.    Appearance: He is well-developed.  HENT:     Head: Normocephalic and atraumatic.  Neck:     Thyroid: No thyromegaly.     Trachea: No tracheal deviation.  Cardiovascular:     Rate and Rhythm: Normal rate.     Pulses:          Dorsalis pedis pulses are 1+ on the right side and 1+ on the left side.       Posterior tibial pulses are 1+ on the right side and 1+ on the left side.     Heart sounds: Normal heart sounds, S1 normal and S2 normal. No murmur heard.   No gallop.  Pulmonary:     Effort: Pulmonary effort is normal. No respiratory distress.     Breath sounds: No wheezing.  Abdominal:     General: There is no distension.     Tenderness: There is no abdominal tenderness. There is no guarding.  Musculoskeletal:     Right shoulder: No swelling or  deformity.     Cervical back: Normal range of motion and neck supple.     Comments: Right foot partial amputation.  Skin:    General: Skin is warm and dry.     Findings: No rash.     Nails: There is no clubbing.  Neurological:     Mental Status: He is alert and oriented to person, place, and time.     Cranial Nerves: No cranial nerve deficit.     Sensory: No sensory deficit.     Gait: Gait normal.     Deep Tendon Reflexes: Reflexes are normal and symmetric.  Psychiatric:        Speech: Speech normal.        Behavior: Behavior normal. Behavior is cooperative.        Thought Content: Thought content normal.        Judgment: Judgment normal.      CMP ( most recent) CMP     Component Value Date/Time   NA 138 05/13/2021 1040   NA 140 06/15/2015 0900   K 5.0 05/13/2021 1040   CL 102 05/13/2021 1040   CO2 28 05/13/2021 1040   GLUCOSE 93 05/13/2021 1040   BUN 18 05/13/2021 1040   BUN 17 06/15/2015 0900   CREATININE 0.98 05/13/2021 1040   CALCIUM 9.0 05/13/2021 1040   PROT 6.6 05/13/2021 1040   PROT 6.9 06/15/2015 0900   ALBUMIN 4.6 09/18/2018 0903   ALBUMIN 4.4 06/15/2015 0900   AST 13 05/13/2021 1040   ALT 10 05/13/2021  6314   HFWYOVZ 85 09/18/2018 0903   BILITOT 0.8 05/13/2021 1040   BILITOT 0.5 06/15/2015 0900   GFRNONAA >60 09/19/2018 0501   GFRNONAA 93 09/10/2018 0750   GFRAA >60 09/19/2018 0501   GFRAA 107 09/10/2018 0750     Diabetic Labs (most recent): Lab Results  Component Value Date   HGBA1C 7.4 (A) 08/25/2021   HGBA1C 7.7 (H) 05/13/2021   HGBA1C 8.5 (H) 01/20/2021     Lipid Panel ( most recent) Lipid Panel     Component Value Date/Time   CHOL 124 05/13/2021 1040   CHOL 131 06/15/2015 0900   TRIG 58 05/13/2021 1040   HDL 34 (L) 05/13/2021 1040   HDL 33 (L) 06/15/2015 0900   CHOLHDL 3.6 05/13/2021 1040   VLDL 21 09/19/2018 0501   LDLCALC 77 05/13/2021 1040   LABVLDL 25 06/15/2015 0900       Assessment & Plan:   1. DM type 2 causing  vascular disease ([place])  - [name] has currently uncontrolled symptomatic type 2 DM since  65 years of age,  with most recent A1c of 7.4 %. Recent labs reviewed. - I had a long discussion with him about the progressive nature of diabetes and the pathology behind its complications. -his diabetes is complicated by CVA, peripheral arterial disease status post right foot partial amputation, obesity/sedentary life and he remains at a high risk for more acute and chronic complications which include CAD, CVA, CKD, retinopathy, and neuropathy. These are all discussed in detail with him.  - I have counseled him on diet  and weight management  by adopting a carbohydrate restricted/protein rich diet. Patient is encouraged to switch to  unprocessed or minimally processed     complex starch and increased protein intake (animal or plant source), fruits, and vegetables. -  he is advised to stick to a routine mealtimes to eat 3 meals  a day and avoid unnecessary snacks ( to snack only to correct hypoglycemia).   - he acknowledges that there is a room for improvement in his food and drink choices. - Suggestion is made for him to avoid simple carbohydrates  from his diet including Cakes, Sweet Desserts, Ice Cream, Soda (diet and regular), Sweet Tea, Candies, Chips, Cookies, Store Bought Juices, Alcohol in Excess of  1-2 drinks a day, Artificial Sweeteners,  Coffee Creamer, and "Sugar-free" Products. This will help patient to have more stable blood glucose profile and potentially avoid unintended weight gain.  - he will be scheduled with Jearld Fenton, RDN, CDE for diabetes education.  - I have approached him with the following individualized plan to manage  his diabetes and patient agrees:   -In light of the fact that he has not been taking Levemir 54 units nightly, I discussed the need for consistency and lowered his Levemir to 50 units nightly, approach.  He agrees to monitor blood glucose twice a day-before  breakfast and at bedtime and return in 2 weeks with his meter and logs for evaluation   -He is advised to continue his metformin at 500 mg p.o. daily at breakfast.    -From diabetes point of view, patient is cleared for the planned knee surgery.   - he is warned not to take insulin without proper monitoring per orders.  - he is encouraged to call clinic for blood glucose levels less than 70 or above 200 mg /dl.  - Specific targets for  A1c;  LDL, HDL,  and Triglycerides were discussed with  the patient.  2) Blood Pressure /Hypertension:  his blood pressure is  controlled to target.   he is advised to continue his current medications including lisinopril/hydrochlorothiazide 20/25 mg p.o. daily with breakfast . 3) Lipids/Hyperlipidemia:   Review of his recent lipid panel showed  controlled  LDL at 77 .  he  is advised to continue    atorvastatin 10 mg daily at bedtime.  Side effects and precautions discussed with him.  4)  Weight/Diet:  Body mass index is 30.27 kg/m.  -   clearly complicating his diabetes care.   he is  a candidate for weight loss. I discussed with him the fact that loss of 5 - 10% of his  current body weight will have the most impact on his diabetes management.  Exercise, and detailed carbohydrates information provided  -  detailed on discharge instructions.  5) Chronic Care/Health Maintenance:  -he  is on ACEI/ARB and Statin medications and  is encouraged to initiate and continue to follow up with Ophthalmology, Dentist,  Podiatrist at least yearly or according to recommendations, and advised to   stay away from smoking. I have recommended yearly flu vaccine and pneumonia vaccine at least every 5 years; moderate intensity exercise for up to 150 minutes weekly; and  sleep for at least 7 hours a day. He will need updated thyroid function test at his subsequent visits.  - he is  advised to maintain close follow up with Kathyrn Drown, MD for primary care needs, as well as his  other providers for optimal and coordinated care.   I spent 65 minutes in the care of the patient today including review of labs from [place], Lipids, Thyroid Function, Hematology (current and previous including abstractions from other facilities); face-to-face time discussing  his blood glucose readings/logs, discussing hypoglycemia and hyperglycemia episodes and symptoms, medications doses, his options of short and long term treatment based on the latest standards of care / guidelines;  discussion about incorporating lifestyle medicine;  and documenting the encounter.     Please refer to Patient Instructions for Blood Glucose Monitoring and Insulin/Medications Dosing Guide"  in media tab for additional information. Please  also refer to " Patient Self Inventory" in the Media  tab for reviewed elements of pertinent patient history.  [name] participated in the discussions, expressed understanding, and voiced agreement with the above plans.  All questions were answered to his satisfaction. he is encouraged to contact clinic should he have any questions or concerns prior to his return visit.   Follow up plan: - Return in about 2 weeks (around 09/08/2021) for F/U with Meter and Logs Only - no Labs.  Glade Lloyd, MD [facility] [address] [place], [place] [address] Phone: [phone]  Fax: [phone]    08/25/2021, 5:23 PM  This note was partially dictated with voice recognition software. Similar sounding words can be transcribed inadequately or may not  be corrected upon review.

## 2021-08-25 NOTE — Patient Instructions (Signed)

## 2021-08-26 LAB — BASIC METABOLIC PANEL
BUN: 19 mg/dL (ref 7–25)
CO2: 22 mmol/L (ref 20–32)
Calcium: 8.9 mg/dL (ref 8.6–10.3)
Chloride: 103 mmol/L (ref 98–110)
Creat: 0.87 mg/dL (ref 0.70–1.35)
Glucose, Bld: 113 mg/dL — ABNORMAL HIGH (ref 65–99)
Potassium: 4.3 mmol/L (ref 3.5–5.3)
Sodium: 137 mmol/L (ref 135–146)

## 2021-08-26 LAB — PSA: PSA: 0.51 ng/mL (ref <=4.00)

## 2021-09-14 ENCOUNTER — Ambulatory Visit: Payer: Medicare HMO | Admitting: "Endocrinology

## 2021-10-13 ENCOUNTER — Other Ambulatory Visit: Payer: Self-pay | Admitting: Family Medicine

## 2021-10-13 DIAGNOSIS — E1169 Type 2 diabetes mellitus with other specified complication: Secondary | ICD-10-CM

## 2021-10-21 DIAGNOSIS — E1142 Type 2 diabetes mellitus with diabetic polyneuropathy: Secondary | ICD-10-CM | POA: Diagnosis not present

## 2021-10-21 DIAGNOSIS — L97512 Non-pressure chronic ulcer of other part of right foot with fat layer exposed: Secondary | ICD-10-CM | POA: Diagnosis not present

## 2021-10-21 DIAGNOSIS — L97522 Non-pressure chronic ulcer of other part of left foot with fat layer exposed: Secondary | ICD-10-CM | POA: Diagnosis not present

## 2021-11-03 DIAGNOSIS — L97522 Non-pressure chronic ulcer of other part of left foot with fat layer exposed: Secondary | ICD-10-CM | POA: Diagnosis not present

## 2021-11-03 DIAGNOSIS — L97512 Non-pressure chronic ulcer of other part of right foot with fat layer exposed: Secondary | ICD-10-CM | POA: Diagnosis not present

## 2021-11-03 DIAGNOSIS — R234 Changes in skin texture: Secondary | ICD-10-CM | POA: Diagnosis not present

## 2021-11-03 DIAGNOSIS — E1142 Type 2 diabetes mellitus with diabetic polyneuropathy: Secondary | ICD-10-CM | POA: Diagnosis not present

## 2021-11-09 ENCOUNTER — Ambulatory Visit (INDEPENDENT_AMBULATORY_CARE_PROVIDER_SITE_OTHER): Payer: Medicare HMO | Admitting: Family Medicine

## 2021-11-09 ENCOUNTER — Other Ambulatory Visit: Payer: Self-pay

## 2021-11-09 VITALS — BP 134/78 | HR 102 | Temp 97.9°F | Wt 261.0 lb

## 2021-11-09 DIAGNOSIS — E1142 Type 2 diabetes mellitus with diabetic polyneuropathy: Secondary | ICD-10-CM | POA: Diagnosis not present

## 2021-11-09 DIAGNOSIS — I739 Peripheral vascular disease, unspecified: Secondary | ICD-10-CM | POA: Diagnosis not present

## 2021-11-09 DIAGNOSIS — Z89419 Acquired absence of unspecified great toe: Secondary | ICD-10-CM | POA: Diagnosis not present

## 2021-11-09 DIAGNOSIS — Z23 Encounter for immunization: Secondary | ICD-10-CM

## 2021-11-09 DIAGNOSIS — E1159 Type 2 diabetes mellitus with other circulatory complications: Secondary | ICD-10-CM

## 2021-11-09 DIAGNOSIS — Z79891 Long term (current) use of opiate analgesic: Secondary | ICD-10-CM | POA: Diagnosis not present

## 2021-11-09 DIAGNOSIS — M1711 Unilateral primary osteoarthritis, right knee: Secondary | ICD-10-CM | POA: Diagnosis not present

## 2021-11-09 MED ORDER — ATORVASTATIN CALCIUM 10 MG PO TABS: ORAL_TABLET | ORAL | 1 refills | Status: DC

## 2021-11-09 MED ORDER — OXYCODONE-ACETAMINOPHEN 10-325 MG PO TABS: ORAL_TABLET | ORAL | 0 refills | Status: DC

## 2021-11-09 NOTE — Progress Notes (Signed)
Subjective:    Patient ID: [name], male    DOB: May 13, 1956, 66 y.o.   MRN: [id number]  Diabetes He presents for his follow-up diabetic visit. He has type 2 diabetes mellitus. His disease course has been stable. There is no change in his home blood glucose trend.  Hypertension The problem is unchanged. The problem is controlled. Risk factors for coronary artery disease include diabetes mellitus.   Patient taking pain medication mostly at night to help with sleep I talked with him about this in detail.  I told him that his pain medicine can be used 1 every 4 hours as needed for pain no more than 4/day and he should never double up on medication.  He states that he will start spreading this out throughout the day   Review of Systems     Objective:   Physical Exam General-in no acute distress Eyes-no discharge Lungs-respiratory rate normal, CTA CV-no murmurs,RRR Extremities skin warm dry no edema Neuro grossly normal Behavior normal, alert        Assessment & Plan:  1. History of amputation of great toe (HCC) No sign of any ulcer currently but he does have 2 areas that the calluses were trimmed down by podiatrist.  No sign of infection currently  2. DM type 2 causing vascular disease ([facility]) No sign of cyanosis in his feet but warning signs discussed.  Keep diabetes under good control.  3. Peripheral vascular disease ([place]) Keep diabetes, blood pressure, cholesterol under good control.  4. Primary osteoarthritis of right knee Patient is supposed to have surgery hopefully later this year he is already been cleared by cardiology but needs to keep A1c closer to 7 is seeing endocrinology currently for his diabetes - oxyCODONE-acetaminophen (PERCOCET) 10-325 MG tablet; Take one tablet by mouth every 6 hours prn pain  Dispense: 120 tablet; Refill: 0 - oxyCODONE-acetaminophen (PERCOCET) 10-325 MG tablet; Take one tablet by mouth every 6 hours prn pain  Dispense: 120 tablet;  Refill: 0 - oxyCODONE-acetaminophen (PERCOCET) 10-325 MG tablet; Take one tablet by mouth every 6 hours prn pain  Dispense: 120 tablet; Refill: 0  5. Diabetic peripheral neuropathy ([place]) The patient was seen in followup for chronic pain. A review over at their current pain status was discussed. Drug registry was checked. Prescriptions were given.  Regular follow-up recommended. Discussion was held regarding the importance of compliance with medication as well as pain medication contract.  Patient was informed that medication may cause drowsiness and should not be combined  with other medications/alcohol or street drugs. If the patient feels medication is causing altered alertness then do not drive or operate dangerous equipment.  Should be noted that the patient appears to be meeting appropriate use of opioids and response.  Evidenced by improved function and decent pain control without significant side effects and no evidence of overt aberrancy issues.  Upon discussion with the patient today they understand that opioid therapy is optional and they feel that the pain has been refractory to reasonable conservative measures and is significant and affecting quality of life enough to warrant ongoing therapy and wishes to continue opioids.  Refills were provided. Patient recently had to use 5/day because of foot pain but he has now gone back to utilizing 4/day he may have a early refill - oxyCODONE-acetaminophen (PERCOCET) 10-325 MG tablet; Take one tablet by mouth every 6 hours prn pain  Dispense: 120 tablet; Refill: 0 - oxyCODONE-acetaminophen (PERCOCET) 10-325 MG tablet; Take one tablet by mouth  every 6 hours prn pain  Dispense: 120 tablet; Refill: 0 - oxyCODONE-acetaminophen (PERCOCET) 10-325 MG tablet; Take one tablet by mouth every 6 hours prn pain  Dispense: 120 tablet; Refill: 0  6. Encounter for long-term opiate analgesic use See above - oxyCODONE-acetaminophen (PERCOCET) 10-325 MG tablet;  Take one tablet by mouth every 6 hours prn pain  Dispense: 120 tablet; Refill: 0 - oxyCODONE-acetaminophen (PERCOCET) 10-325 MG tablet; Take one tablet by mouth every 6 hours prn pain  Dispense: 120 tablet; Refill: 0 - oxyCODONE-acetaminophen (PERCOCET) 10-325 MG tablet; Take one tablet by mouth every 6 hours prn pain  Dispense: 120 tablet; Refill: 0  7. Need for vaccination Today - Pneumococcal conjugate vaccine 20-valent (Prevnar 20)

## 2021-12-08 DIAGNOSIS — L97522 Non-pressure chronic ulcer of other part of left foot with fat layer exposed: Secondary | ICD-10-CM | POA: Diagnosis not present

## 2021-12-08 DIAGNOSIS — E1142 Type 2 diabetes mellitus with diabetic polyneuropathy: Secondary | ICD-10-CM | POA: Diagnosis not present

## 2021-12-27 ENCOUNTER — Other Ambulatory Visit: Payer: Self-pay | Admitting: Family Medicine

## 2021-12-27 DIAGNOSIS — E1169 Type 2 diabetes mellitus with other specified complication: Secondary | ICD-10-CM

## 2022-01-12 ENCOUNTER — Telehealth: Payer: Self-pay | Admitting: Family Medicine

## 2022-01-12 DIAGNOSIS — B351 Tinea unguium: Secondary | ICD-10-CM | POA: Diagnosis not present

## 2022-01-12 DIAGNOSIS — I739 Peripheral vascular disease, unspecified: Secondary | ICD-10-CM | POA: Diagnosis not present

## 2022-01-12 DIAGNOSIS — L84 Corns and callosities: Secondary | ICD-10-CM | POA: Diagnosis not present

## 2022-01-12 DIAGNOSIS — E1142 Type 2 diabetes mellitus with diabetic polyneuropathy: Secondary | ICD-10-CM | POA: Diagnosis not present

## 2022-01-12 NOTE — Telephone Encounter (Signed)
LMTRC

## 2022-01-12 NOTE — Telephone Encounter (Signed)
Patient is requesting a prescription for his insulin needles called into [facility] ?

## 2022-01-13 MED ORDER — BD PEN NEEDLE SHORT U/F 31G X 8 MM MISC: 5 refills | Status: DC

## 2022-01-13 NOTE — Telephone Encounter (Signed)
Prescription sent to pharmacy.

## 2022-02-08 ENCOUNTER — Ambulatory Visit (INDEPENDENT_AMBULATORY_CARE_PROVIDER_SITE_OTHER): Payer: Medicare HMO | Admitting: Family Medicine

## 2022-02-08 ENCOUNTER — Encounter: Payer: Self-pay | Admitting: Family Medicine

## 2022-02-08 VITALS — BP 130/70 | HR 108 | Temp 96.6°F | Wt 251.8 lb

## 2022-02-08 DIAGNOSIS — E1169 Type 2 diabetes mellitus with other specified complication: Secondary | ICD-10-CM

## 2022-02-08 DIAGNOSIS — Z794 Long term (current) use of insulin: Secondary | ICD-10-CM | POA: Diagnosis not present

## 2022-02-08 DIAGNOSIS — Z79891 Long term (current) use of opiate analgesic: Secondary | ICD-10-CM | POA: Diagnosis not present

## 2022-02-08 DIAGNOSIS — R413 Other amnesia: Secondary | ICD-10-CM | POA: Diagnosis not present

## 2022-02-08 DIAGNOSIS — E782 Mixed hyperlipidemia: Secondary | ICD-10-CM

## 2022-02-08 DIAGNOSIS — M1711 Unilateral primary osteoarthritis, right knee: Secondary | ICD-10-CM

## 2022-02-08 DIAGNOSIS — E1142 Type 2 diabetes mellitus with diabetic polyneuropathy: Secondary | ICD-10-CM

## 2022-02-08 DIAGNOSIS — E1140 Type 2 diabetes mellitus with diabetic neuropathy, unspecified: Secondary | ICD-10-CM

## 2022-02-08 DIAGNOSIS — Z79899 Other long term (current) drug therapy: Secondary | ICD-10-CM

## 2022-02-08 MED ORDER — OXYCODONE-ACETAMINOPHEN 10-325 MG PO TABS: ORAL_TABLET | ORAL | 0 refills | Status: DC

## 2022-02-08 NOTE — Progress Notes (Signed)
? ?Subjective:  ? ? Patient ID: [name], male    DOB: July 03, 1956, 66 y.o.   MRN: [id number] ? ?Diabetes ?He presents for his follow-up diabetic visit. He has type 2 diabetes mellitus. There are no hypoglycemic associated symptoms. There are no diabetic associated symptoms. There are no hypoglycemic complications. There are no diabetic complications. Risk factors for coronary artery disease include hypertension and male sex. He sees a podiatrist.Eye exam is current.  ?Pt wife would like to see if insurance will cover CGM. ? ?This patient was seen today for chronic pain ? ?The medication list was reviewed and updated. ? ?Location of Pain for which the patient has been treated with regarding narcotics:  ? ?Onset of this pain:  ? ? -Compliance with medication: Oxycodone 10-325 ? ?- Number patient states they take daily: 3 most day ? ?-when was the last dose patient took? This morning around 6  ? ?The patient was advised the importance of maintaining medication and not using illegal substances with these. ? ?Here for refills and follow up ? ?The patient was educated that we can provide 3 monthly scripts for their medication, it is their responsibility to follow the instructions. ? ?Side effects or complications from medications: none ? ?Patient is aware that pain medications are meant to minimize the severity of the pain to allow their pain levels to improve to allow for better function. They are aware of that pain medications cannot totally remove their pain. ? ?Due for UDT ( at least once per year) : completed June 2022 ? ?Scale of 1 to 10 ( 1 is least 10 is most) ?Your pain level without the medicine: 8 ?Your pain level with medication: 5 ? ?Scale 1 to 10 ( 1-helps very little, 10 helps very well) ?How well does your pain medication reduce your pain so you can function better through out the day? 8 ? ?Quality of the pain:  ? ?Persistence of the pain:  ? ?Modifying factors:  ? ?High risk medication use - Plan:  Basic Metabolic Panel (BMET), Hemoglobin A1c, Hepatic function panel, Lipid Profile, Urine Microalbumin w/creat. ratio, CANCELED: Hemoglobin A1c, CANCELED: Basic Metabolic Panel (BMET), CANCELED: Lipid Profile, CANCELED: Hepatic function panel, CANCELED: Urine Microalbumin w/creat. ratio ? ?DM type 2 with diabetic mixed hyperlipidemia ([place]) - Plan: Basic Metabolic Panel (BMET), Hemoglobin A1c, Hepatic function panel, Lipid Profile, Urine Microalbumin w/creat. ratio, CANCELED: Hemoglobin A1c, CANCELED: Basic Metabolic Panel (BMET), CANCELED: Lipid Profile, CANCELED: Hepatic function panel, CANCELED: Urine Microalbumin w/creat. ratio ? ?Mixed hyperlipidemia - Plan: Basic Metabolic Panel (BMET), Hemoglobin A1c, Hepatic function panel, Lipid Profile, Urine Microalbumin w/creat. ratio, CANCELED: Hemoglobin A1c, CANCELED: Basic Metabolic Panel (BMET), CANCELED: Lipid Profile, CANCELED: Hepatic function panel, CANCELED: Urine Microalbumin w/creat. ratio ? ?Type 2 diabetes mellitus with diabetic neuropathy, with long-term current use of insulin ([place]) - Plan: Basic Metabolic Panel (BMET), Hemoglobin A1c, Hepatic function panel, Lipid Profile, Urine Microalbumin w/creat. ratio, CANCELED: Hemoglobin A1c, CANCELED: Basic Metabolic Panel (BMET), CANCELED: Lipid Profile, CANCELED: Hepatic function panel, CANCELED: Urine Microalbumin w/creat. ratio ? ?Primary osteoarthritis of right knee - Plan: oxyCODONE-acetaminophen (PERCOCET) 10-325 MG tablet, oxyCODONE-acetaminophen (PERCOCET) 10-325 MG tablet, oxyCODONE-acetaminophen (PERCOCET) 10-325 MG tablet ? ?Diabetic peripheral neuropathy ([name]) - Plan: oxyCODONE-acetaminophen (PERCOCET) 10-325 MG tablet, oxyCODONE-acetaminophen (PERCOCET) 10-325 MG tablet, oxyCODONE-acetaminophen (PERCOCET) 10-325 MG tablet ? ?Encounter for long-term opiate analgesic use - Plan: oxyCODONE-acetaminophen (PERCOCET) 10-325 MG tablet, oxyCODONE-acetaminophen (PERCOCET) 10-325 MG tablet,  oxyCODONE-acetaminophen (PERCOCET) 10-325 MG tablet ? ?Short-term memory loss ? ?Patient has significant  short-term memory loss which is progressive ?It is causing him troubles around the house and remembering things.  He is able to do local driving but more complex things he often forgets, this is been progressive since he has had a stroke ?We did discuss this in detail today ?His wife was with him ? ?40 spent with patient today between discussion with testing and further discussion and examination ?   ? ? ?Review of Systems ? ?   ?Objective:  ? Physical Exam ? ?General-in no acute distress ?Eyes-no discharge ?Lungs-respiratory rate normal, CTA ?CV-no murmurs,RRR ?Extremities skin warm dry no edema ?Neuro grossly normal ?Behavior normal, alert ?Has diabetic feet ?Previous amputation ?Preulcerative callus ?Recommend patient have diabetic shoes ? ? ?   ?Assessment & Plan:  ?1. High risk medication use ?Labs ordered for today ?- Basic Metabolic Panel (BMET) ?- Hemoglobin A1c ?- Hepatic function panel ?- Lipid Profile ?- Urine Microalbumin w/creat. ratio ? ?2. DM type 2 with diabetic mixed hyperlipidemia ([place]) ?Very important to get diabetes under control.  Patient would like continuous glucose monitor.  We will order Dexcom ?- Basic Metabolic Panel (BMET) ?- Hemoglobin A1c ?- Hepatic function panel ?- Lipid Profile ?- Urine Microalbumin w/creat. ratio ? ?3. Mixed hyperlipidemia ?Cholesterol patient will do the best he can with watching diet taking medication check labs ?- Basic Metabolic Panel (BMET) ?- Hemoglobin A1c ?- Hepatic function panel ?- Lipid Profile ?- Urine Microalbumin w/creat. ratio ? ?4. Type 2 diabetes mellitus with diabetic neuropathy, with long-term current use of insulin ([place]) ?Minimize starches in diet stay away from sugary foods continue the medication check W7P ?- Basic Metabolic Panel (BMET) ?- Hemoglobin A1c ?- Hepatic function panel ?- Lipid Profile ?- Urine Microalbumin w/creat. ratio ? ?5.  Primary osteoarthritis of right knee ?Patient has ongoing pain and discomfort right knee ?He has been on chronic pain medications for his osteoarthritis and back pain for years ?The patient was seen in followup for chronic pain. ?A review over at their current pain status was discussed. Drug registry was checked. ?Prescriptions were given.  Regular follow-up recommended. ?Discussion was held regarding the importance of compliance with medication as well as pain medication contract. ? ?Patient was informed that medication may cause drowsiness and should not be combined  with other medications/alcohol or street drugs. If the patient feels medication is causing altered alertness then do not drive or operate dangerous equipment. ? ?Should be noted that the patient appears to be meeting appropriate use of opioids and response.  Evidenced by improved function and decent pain control without significant side effects and no evidence of overt aberrancy issues.  Upon discussion with the patient today they understand that opioid therapy is optional and they feel that the pain has been refractory to reasonable conservative measures and is significant and affecting quality of life enough to warrant ongoing therapy and wishes to continue opioids.  Refills were provided. ? ?- oxyCODONE-acetaminophen (PERCOCET) 10-325 MG tablet; Take one tablet by mouth every 6 hours prn pain  Dispense: 120 tablet; Refill: 0 ?- oxyCODONE-acetaminophen (PERCOCET) 10-325 MG tablet; Take one tablet by mouth every 6 hours prn pain  Dispense: 120 tablet; Refill: 0 ?- oxyCODONE-acetaminophen (PERCOCET) 10-325 MG tablet; Take one tablet by mouth every 6 hours prn pain  Dispense: 120 tablet; Refill: 0 ? ?6. Diabetic peripheral neuropathy ([place]) ?See above ?- oxyCODONE-acetaminophen (PERCOCET) 10-325 MG tablet; Take one tablet by mouth every 6 hours prn pain  Dispense: 120 tablet; Refill: 0 ?- oxyCODONE-acetaminophen (PERCOCET)  10-325 MG tablet; Take one  tablet by mouth every 6 hours prn pain  Dispense: 120 tablet; Refill: 0 ?- oxyCODONE-acetaminophen (PERCOCET) 10-325 MG tablet; Take one tablet by mouth every 6 hours prn pain  Dispense: 120 tablet; Refill: 0 ? ?7. Encounter

## 2022-02-16 DIAGNOSIS — E1142 Type 2 diabetes mellitus with diabetic polyneuropathy: Secondary | ICD-10-CM | POA: Diagnosis not present

## 2022-02-16 DIAGNOSIS — L97522 Non-pressure chronic ulcer of other part of left foot with fat layer exposed: Secondary | ICD-10-CM | POA: Diagnosis not present

## 2022-02-25 DIAGNOSIS — Z79899 Other long term (current) drug therapy: Secondary | ICD-10-CM | POA: Diagnosis not present

## 2022-02-25 DIAGNOSIS — E1169 Type 2 diabetes mellitus with other specified complication: Secondary | ICD-10-CM | POA: Diagnosis not present

## 2022-02-25 DIAGNOSIS — E782 Mixed hyperlipidemia: Secondary | ICD-10-CM | POA: Diagnosis not present

## 2022-02-25 DIAGNOSIS — E1140 Type 2 diabetes mellitus with diabetic neuropathy, unspecified: Secondary | ICD-10-CM | POA: Diagnosis not present

## 2022-02-25 DIAGNOSIS — Z794 Long term (current) use of insulin: Secondary | ICD-10-CM | POA: Diagnosis not present

## 2022-02-26 LAB — BASIC METABOLIC PANEL
BUN: 15 mg/dL (ref 7–25)
CO2: 27 mmol/L (ref 20–32)
Calcium: 9.2 mg/dL (ref 8.6–10.3)
Chloride: 103 mmol/L (ref 98–110)
Creat: 1.02 mg/dL (ref 0.70–1.35)
Glucose, Bld: 231 mg/dL — ABNORMAL HIGH (ref 65–139)
Potassium: 4.7 mmol/L (ref 3.5–5.3)
Sodium: 137 mmol/L (ref 135–146)

## 2022-02-26 LAB — LIPID PANEL
Cholesterol: 116 mg/dL (ref <200)
HDL: 44 mg/dL (ref >=40)
LDL Cholesterol (Calc): 59 mg/dL (calc)
Non-HDL Cholesterol (Calc): 72 mg/dL (calc) (ref <130)
Total CHOL/HDL Ratio: 2.6 (calc) (ref <5.0)
Triglycerides: 45 mg/dL (ref <150)

## 2022-02-26 LAB — HEPATIC FUNCTION PANEL
AG Ratio: 2.1 (calc) (ref 1.0–2.5)
ALT: 15 U/L (ref 9–46)
AST: 17 U/L (ref 10–35)
Albumin: 4.5 g/dL (ref 3.6–5.1)
Alkaline phosphatase (APISO): 65 U/L (ref 35–144)
Bilirubin, Direct: 0.4 mg/dL — ABNORMAL HIGH (ref 0.0–0.2)
Globulin: 2.1 g/dL (calc) (ref 1.9–3.7)
Indirect Bilirubin: 0.9 mg/dL (calc) (ref 0.2–1.2)
Total Bilirubin: 1.3 mg/dL — ABNORMAL HIGH (ref 0.2–1.2)
Total Protein: 6.6 g/dL (ref 6.1–8.1)

## 2022-02-26 LAB — HEMOGLOBIN A1C
Hgb A1c MFr Bld: 7.3 % of total Hgb — ABNORMAL HIGH (ref <5.7)
Mean Plasma Glucose: 163 mg/dL
eAG (mmol/L): 9 mmol/L

## 2022-02-26 LAB — MICROALBUMIN / CREATININE URINE RATIO
Creatinine, Urine: 127 mg/dL (ref 20–320)
Microalb Creat Ratio: 128 mcg/mg creat — ABNORMAL HIGH (ref <30)
Microalb, Ur: 16.2 mg/dL

## 2022-03-08 ENCOUNTER — Telehealth: Payer: Self-pay | Admitting: Family Medicine

## 2022-03-08 ENCOUNTER — Telehealth: Payer: Self-pay | Admitting: *Deleted

## 2022-03-08 ENCOUNTER — Ambulatory Visit (HOSPITAL_COMMUNITY)
Admission: RE | Admit: 2022-03-08 | Discharge: 2022-03-08 | Disposition: A | Discharge status: Home / Self Care | Payer: Medicare HMO | Source: Ambulatory Visit | Attending: Family Medicine | Admitting: Family Medicine

## 2022-03-08 DIAGNOSIS — I639 Cerebral infarction, unspecified: Secondary | ICD-10-CM | POA: Diagnosis not present

## 2022-03-08 DIAGNOSIS — G319 Degenerative disease of nervous system, unspecified: Secondary | ICD-10-CM | POA: Diagnosis not present

## 2022-03-08 DIAGNOSIS — R413 Other amnesia: Secondary | ICD-10-CM | POA: Diagnosis not present

## 2022-03-08 NOTE — Telephone Encounter (Signed)
Nurses Please see other message Patient has had mini strokes We need to discuss mini strokes and our approach to preventing these at his follow-up visit.  MRI did not show any tumor. As for discussing it with the daughter via this phone call-that can only be done if she is on the proper release of information If she is not on the proper release of information I would recommend that she talk with her dad regarding the diagnosis (because if she is not on the proper release form we cannot directly discuss this with her-if she is on the proper release form you can discuss all of this with her) I am always open toward family members coming with their family for important medical discussions

## 2022-03-08 NOTE — Telephone Encounter (Signed)
Nurses Please let patient know that MRI showed mini strokes We will discuss this further when he follows up tomorrow Continue aspirin Did not show any tumor

## 2022-03-08 NOTE — Telephone Encounter (Signed)
[facility] calling to give report on pt MRI Head that was completed this morning. Pt did come by office after scan and was told he needed to be seen ASAP by MRI tech at [facility]. Pt has appt for tomorrow. Please advise. Thank you

## 2022-03-08 NOTE — Telephone Encounter (Signed)
Pt contacted and verbalized understanding.  

## 2022-03-08 NOTE — Telephone Encounter (Signed)
Patient's daughter says dad had an MRI done today.  She wants to know if she needs to take off work tomorrow so she can come to the appointment with him tomorrow.  Please advise. Thank you

## 2022-03-08 NOTE — Telephone Encounter (Signed)
Daughter is advised that Dr Nicki Reaper is open for family to come to visit if patient would like this but he can not speak with daughter about father or results without proper authorization on file completed by patient.  Daughter verbalized understanding.

## 2022-03-09 ENCOUNTER — Ambulatory Visit (INDEPENDENT_AMBULATORY_CARE_PROVIDER_SITE_OTHER): Payer: Medicare HMO | Admitting: Family Medicine

## 2022-03-09 VITALS — BP 122/74 | HR 88 | Temp 98.4°F | Wt 245.0 lb

## 2022-03-09 DIAGNOSIS — R413 Other amnesia: Secondary | ICD-10-CM | POA: Diagnosis not present

## 2022-03-09 DIAGNOSIS — G459 Transient cerebral ischemic attack, unspecified: Secondary | ICD-10-CM

## 2022-03-09 NOTE — Progress Notes (Signed)
   Subjective:    Patient ID: [name], male    DOB: November 19, 1955, 66 y.o.   MRN: [id number]  HPI Discuss recent MRI brain scan results  Patient is had increased short-term memory issues as well as repeating himself but at the same time able to do yard work cutting grass and able to safely drive during the day to places locally without getting lost or having problems family has noticed an increase in his problems over the past several months we did a repeat MRI did not show any tumor or growth but did show multiple mini strokes.  Patient is at risk for further strokes Will need significant work-up Significant time spent today discussing the work-up with husband and wife and their daughter  Review of Systems     Objective:   Physical Exam  General-in no acute distress Eyes-no discharge Lungs-respiratory rate normal, CTA CV-no murmurs,RRR Extremities skin warm dry no edema Neuro grossly normal Behavior normal, alert  Finger-to-nose normal  Romberg negative Walks with abnormal gait related to his right knee troubles Balance is fair Patient does have short-term memory issues please see previous note     Assessment & Plan:  Short-term memory dysfunction Cognitive dysfunction Patient showing early signs of dementia or related to multi-infarct dementia. Recent MRI showing ongoing mini strokes Blood pressure under good control Keeping sugar under good control Will do carotid ultrasound Also cardiology consult for telemetry and transesophageal echo Also referral to neurology for further evaluation regarding strokes Will monitor here every 3 months Monitor sooner if any problems Once again local driving okay Long distance driving not okay

## 2022-03-10 ENCOUNTER — Other Ambulatory Visit: Payer: Self-pay | Admitting: Family Medicine

## 2022-03-10 DIAGNOSIS — G459 Transient cerebral ischemic attack, unspecified: Secondary | ICD-10-CM

## 2022-03-13 ENCOUNTER — Other Ambulatory Visit: Payer: Self-pay | Admitting: Family Medicine

## 2022-03-21 ENCOUNTER — Telehealth: Payer: Self-pay | Admitting: Family Medicine

## 2022-03-21 NOTE — Telephone Encounter (Signed)
FYI- wife-Debra just called stating she is carrying patient to ER due to mini -strokes and cant hold his water. She just wanted provider to know.

## 2022-03-23 ENCOUNTER — Encounter (HOSPITAL_COMMUNITY): Payer: Self-pay | Admitting: Emergency Medicine

## 2022-03-23 ENCOUNTER — Emergency Department (HOSPITAL_COMMUNITY): Payer: Medicare HMO

## 2022-03-23 ENCOUNTER — Other Ambulatory Visit: Payer: Self-pay

## 2022-03-23 ENCOUNTER — Inpatient Hospital Stay (HOSPITAL_COMMUNITY)
Admission: EM | Admit: 2022-03-23 | Discharge: 2022-03-25 | DRG: 871 | Disposition: A | Discharge status: Home / Self Care | Payer: Medicare HMO | Attending: Internal Medicine | Admitting: Internal Medicine

## 2022-03-23 DIAGNOSIS — N390 Urinary tract infection, site not specified: Secondary | ICD-10-CM | POA: Diagnosis present

## 2022-03-23 DIAGNOSIS — Z7984 Long term (current) use of oral hypoglycemic drugs: Secondary | ICD-10-CM

## 2022-03-23 DIAGNOSIS — Z800 Family history of malignant neoplasm of digestive organs: Secondary | ICD-10-CM

## 2022-03-23 DIAGNOSIS — M109 Gout, unspecified: Secondary | ICD-10-CM | POA: Diagnosis present

## 2022-03-23 DIAGNOSIS — A4151 Sepsis due to Escherichia coli [E. coli]: Principal | ICD-10-CM | POA: Diagnosis present

## 2022-03-23 DIAGNOSIS — I429 Cardiomyopathy, unspecified: Secondary | ICD-10-CM | POA: Diagnosis present

## 2022-03-23 DIAGNOSIS — Z885 Allergy status to narcotic agent status: Secondary | ICD-10-CM

## 2022-03-23 DIAGNOSIS — Z79899 Other long term (current) drug therapy: Secondary | ICD-10-CM

## 2022-03-23 DIAGNOSIS — N452 Orchitis: Secondary | ICD-10-CM

## 2022-03-23 DIAGNOSIS — E782 Mixed hyperlipidemia: Secondary | ICD-10-CM | POA: Diagnosis present

## 2022-03-23 DIAGNOSIS — Z801 Family history of malignant neoplasm of trachea, bronchus and lung: Secondary | ICD-10-CM

## 2022-03-23 DIAGNOSIS — Z791 Long term (current) use of non-steroidal anti-inflammatories (NSAID): Secondary | ICD-10-CM

## 2022-03-23 DIAGNOSIS — E1169 Type 2 diabetes mellitus with other specified complication: Secondary | ICD-10-CM | POA: Diagnosis present

## 2022-03-23 DIAGNOSIS — Z87891 Personal history of nicotine dependence: Secondary | ICD-10-CM

## 2022-03-23 DIAGNOSIS — N453 Epididymo-orchitis: Secondary | ICD-10-CM | POA: Diagnosis not present

## 2022-03-23 DIAGNOSIS — I252 Old myocardial infarction: Secondary | ICD-10-CM

## 2022-03-23 DIAGNOSIS — R4182 Altered mental status, unspecified: Secondary | ICD-10-CM | POA: Diagnosis not present

## 2022-03-23 DIAGNOSIS — R000 Tachycardia, unspecified: Secondary | ICD-10-CM | POA: Diagnosis not present

## 2022-03-23 DIAGNOSIS — A419 Sepsis, unspecified organism: Secondary | ICD-10-CM

## 2022-03-23 DIAGNOSIS — Z888 Allergy status to other drugs, medicaments and biological substances status: Secondary | ICD-10-CM

## 2022-03-23 DIAGNOSIS — I10 Essential (primary) hypertension: Secondary | ICD-10-CM | POA: Diagnosis present

## 2022-03-23 DIAGNOSIS — E876 Hypokalemia: Secondary | ICD-10-CM

## 2022-03-23 DIAGNOSIS — E1151 Type 2 diabetes mellitus with diabetic peripheral angiopathy without gangrene: Secondary | ICD-10-CM | POA: Diagnosis present

## 2022-03-23 DIAGNOSIS — Z8249 Family history of ischemic heart disease and other diseases of the circulatory system: Secondary | ICD-10-CM

## 2022-03-23 DIAGNOSIS — I2510 Atherosclerotic heart disease of native coronary artery without angina pectoris: Secondary | ICD-10-CM | POA: Diagnosis present

## 2022-03-23 DIAGNOSIS — N454 Abscess of epididymis or testis: Principal | ICD-10-CM

## 2022-03-23 DIAGNOSIS — Z833 Family history of diabetes mellitus: Secondary | ICD-10-CM

## 2022-03-23 DIAGNOSIS — M1990 Unspecified osteoarthritis, unspecified site: Secondary | ICD-10-CM | POA: Diagnosis present

## 2022-03-23 DIAGNOSIS — Z8673 Personal history of transient ischemic attack (TIA), and cerebral infarction without residual deficits: Secondary | ICD-10-CM

## 2022-03-23 DIAGNOSIS — Z794 Long term (current) use of insulin: Secondary | ICD-10-CM

## 2022-03-23 DIAGNOSIS — Z7982 Long term (current) use of aspirin: Secondary | ICD-10-CM

## 2022-03-23 DIAGNOSIS — R410 Disorientation, unspecified: Secondary | ICD-10-CM | POA: Diagnosis not present

## 2022-03-23 DIAGNOSIS — G9341 Metabolic encephalopathy: Secondary | ICD-10-CM

## 2022-03-23 LAB — URINALYSIS, ROUTINE W REFLEX MICROSCOPIC
Bilirubin Urine: NEGATIVE
Glucose, UA: NEGATIVE mg/dL
Ketones, ur: 20 mg/dL — AB
Nitrite: POSITIVE — AB
Protein, ur: 100 mg/dL — AB
Specific Gravity, Urine: 1.021 (ref 1.005–1.030)
pH: 5 (ref 5.0–8.0)

## 2022-03-23 LAB — CBC WITH DIFFERENTIAL/PLATELET
Abs Immature Granulocytes: 0.08 10*3/uL — ABNORMAL HIGH (ref 0.00–0.07)
Basophils Absolute: 0 10*3/uL (ref 0.0–0.1)
Basophils Relative: 0 %
Eosinophils Absolute: 0 10*3/uL (ref 0.0–0.5)
Eosinophils Relative: 0 %
HCT: 35.7 % — ABNORMAL LOW (ref 39.0–52.0)
Hemoglobin: 11.7 g/dL — ABNORMAL LOW (ref 13.0–17.0)
Immature Granulocytes: 1 %
Lymphocytes Relative: 4 %
Lymphs Abs: 0.5 10*3/uL — ABNORMAL LOW (ref 0.7–4.0)
MCH: 27.5 pg (ref 26.0–34.0)
MCHC: 32.8 g/dL (ref 30.0–36.0)
MCV: 84 fL (ref 80.0–100.0)
Monocytes Absolute: 1 10*3/uL (ref 0.1–1.0)
Monocytes Relative: 9 %
Neutro Abs: 9.1 10*3/uL — ABNORMAL HIGH (ref 1.7–7.7)
Neutrophils Relative %: 86 %
Platelets: 202 10*3/uL (ref 150–400)
RBC: 4.25 MIL/uL (ref 4.22–5.81)
RDW: 14.2 % (ref 11.5–15.5)
WBC: 10.7 10*3/uL — ABNORMAL HIGH (ref 4.0–10.5)
nRBC: 0 % (ref 0.0–0.2)

## 2022-03-23 LAB — LACTIC ACID, PLASMA
Lactic Acid, Venous: 1.1 mmol/L (ref 0.5–1.9)
Lactic Acid, Venous: 1.1 mmol/L (ref 0.5–1.9)

## 2022-03-23 LAB — BASIC METABOLIC PANEL
Anion gap: 8 (ref 5–15)
BUN: 21 mg/dL (ref 8–23)
CO2: 25 mmol/L (ref 22–32)
Calcium: 8.2 mg/dL — ABNORMAL LOW (ref 8.9–10.3)
Chloride: 102 mmol/L (ref 98–111)
Creatinine, Ser: 1 mg/dL (ref 0.61–1.24)
GFR, Estimated: >60 mL/min (ref >60)
Glucose, Bld: 184 mg/dL — ABNORMAL HIGH (ref 70–99)
Potassium: 3.4 mmol/L — ABNORMAL LOW (ref 3.5–5.1)
Sodium: 135 mmol/L (ref 135–145)

## 2022-03-23 MED ORDER — SODIUM CHLORIDE 0.9 % IV SOLN
2.0000 g | Freq: Once | INTRAVENOUS | Status: AC
  Administered 2022-03-23: 2 g via INTRAVENOUS
  Filled 2022-03-23: qty 20

## 2022-03-23 MED ORDER — SODIUM CHLORIDE 0.9 % IV SOLN: INTRAVENOUS | Status: DC

## 2022-03-23 NOTE — ED Triage Notes (Addendum)
Pt BIB [facility] for confusion reported by wife; pt reports he has been confused since previous stroke; pt reports he has TIA's, pt alert and oriented to person and place, pt reports concern for decreased and painful urination with swelling to left testicles x 2 days

## 2022-03-23 NOTE — ED Provider Notes (Signed)
[facility] EMERGENCY DEPARTMENT Provider Note   CSN: [id number] Arrival date & time: 03/23/22  1942     History  Chief Complaint  Patient presents with   Altered Mental Status    [name] is a 66 y.o. male.  HPI He presents for evaluation of pain and swelling in his left testicle for 2 days.  The testicle is also very tender.  Initially he was having increased urination but now he feels like he cannot void.  He has felt warm but not taken his temperature at home.  No trauma to the testicle.  History of strokes.    Home Medications Prior to Admission medications   Medication Sig Start Date End Date Taking? Authorizing Provider  aspirin 325 MG tablet Take 325 mg by mouth every evening.     [provider]  atorvastatin (LIPITOR) 10 MG tablet TAKE ONE TABLET BY MOUTH ON MONDAY AND FRIDAY 11/09/21   Kathyrn Drown, MD  betamethasone valerate ointment (VALISONE) 0.1 % APPLY 1 APPLICATION TOPICALLY 2 (TWO) TIMES DAILY AS NEEDED. Patient taking differently: Apply 1 application. topically 2 (two) times daily as needed (Psoriasis). 11/16/19   Kathyrn Drown, MD  ergocalciferol (VITAMIN D2) 1.25 MG (50000 UT) capsule Take 1 capsule by mouth once a week. 06/09/21   [provider]  furosemide (LASIX) 20 MG tablet Take 20 mg by mouth daily as needed. For fluid retention    [provider]  insulin detemir (LEVEMIR FLEXTOUCH) 100 UNIT/ML FlexPen INJECT 54 UNITS AT BEDTIME - MAY TITRATE UP TO 70 UNITS INTO SKIN AT BEDTIME FOR DIABETES 03/15/22   Kathyrn Drown, MD  Insulin Pen Needle (B-D ULTRAFINE III SHORT PEN) 31G X 8 MM MISC USE PEN NEEDLES DAILY WITH LANTUS 01/13/22   Luking, Scott A, MD  KLOR-CON M20 20 MEQ tablet TAKE 1 TABLET (20 MEQ TOTAL) BY MOUTH DAILY. WHEN TAKING LASIX Patient taking differently: Take 20 mEq by mouth daily as needed (With Furosemide). 02/19/20   Kathyrn Drown, MD  lisinopril-hydrochlorothiazide (ZESTORETIC) 20-25 MG tablet Take 1 tablet  by mouth daily. 12/30/19   [provider]  meloxicam (MOBIC) 15 MG tablet Take 15 mg by mouth daily. 08/15/21   [provider]  metFORMIN (GLUCOPHAGE-XR) 500 MG 24 hr tablet TAKE 1 TABLET BY MOUTH EVERY DAY WITH BREAKFAST 12/27/21   Kathyrn Drown, MD  Multiple Vitamins-Minerals (MULTIVITAMIN WITH MINERALS) tablet Take 1 tablet by mouth daily.    [provider]  oxyCODONE-acetaminophen (PERCOCET) 10-325 MG tablet Take one tablet by mouth every 6 hours prn pain 02/08/22   Kathyrn Drown, MD  oxyCODONE-acetaminophen (PERCOCET) 10-325 MG tablet Take one tablet by mouth every 6 hours prn pain 02/08/22   Kathyrn Drown, MD  oxyCODONE-acetaminophen (PERCOCET) 10-325 MG tablet Take one tablet by mouth every 6 hours prn pain 02/08/22   Kathyrn Drown, MD      Allergies    Metformin and related, Statins, and Vicodin [hydrocodone-acetaminophen]    Review of Systems   Review of Systems  Physical Exam Updated Vital Signs BP 128/87   Pulse 89   Temp (!) 101.5 F (38.6 C) (Rectal)   Resp (!) 31   Ht '6\' 3"'$  (1.905 m)   Wt 124.7 kg   SpO2 98%   BMI 34.37 kg/m  Physical Exam Vitals and nursing note reviewed.  Constitutional:      General: He is not in acute distress.    Appearance: He is  well-developed. He is not ill-appearing, toxic-appearing or diaphoretic.  HENT:     Head: Normocephalic and atraumatic.     Right Ear: External ear normal.     Left Ear: External ear normal.     Nose: No congestion or rhinorrhea.  Eyes:     Conjunctiva/sclera: Conjunctivae normal.     Pupils: Pupils are equal, round, and reactive to light.  Neck:     Trachea: Phonation normal.  Cardiovascular:     Rate and Rhythm: Normal rate and regular rhythm.     Heart sounds: Normal heart sounds.  Pulmonary:     Effort: Pulmonary effort is normal.     Breath sounds: Normal breath sounds.  Abdominal:     General: There is no distension.     Palpations: Abdomen is soft.     Tenderness:  There is no abdominal tenderness.  Genitourinary:    Comments: Erythema left hemiscrotum.  Left testicle/epididymis structure is enlarged, tender to palpation.  No urethral discharge.  Mild skin erythema in the left perineum, which appears to be fungal. Musculoskeletal:        General: Normal range of motion.     Cervical back: Normal range of motion and neck supple.  Skin:    General: Skin is warm and dry.  Neurological:     Mental Status: He is alert and oriented to person, place, and time.     Cranial Nerves: No cranial nerve deficit.     Sensory: No sensory deficit.     Motor: No abnormal muscle tone.     Coordination: Coordination normal.  Psychiatric:        Mood and Affect: Mood normal.        Behavior: Behavior normal.        Thought Content: Thought content normal.        Judgment: Judgment normal.    ED Results / Procedures / Treatments   Labs (all labs ordered are listed, but only abnormal results are displayed) Labs Reviewed  BASIC METABOLIC PANEL - Abnormal; Notable for the following components:      Result Value   Potassium 3.4 (*)    Glucose, Bld 184 (*)    Calcium 8.2 (*)    All other components within normal limits  CBC WITH DIFFERENTIAL/PLATELET - Abnormal; Notable for the following components:   WBC 10.7 (*)    Hemoglobin 11.7 (*)    HCT 35.7 (*)    Neutro Abs 9.1 (*)    Lymphs Abs 0.5 (*)    Abs Immature Granulocytes 0.08 (*)    All other components within normal limits  URINALYSIS, ROUTINE W REFLEX MICROSCOPIC - Abnormal; Notable for the following components:   Color, Urine AMBER (*)    APPearance HAZY (*)    Hgb urine dipstick MODERATE (*)    Ketones, ur 20 (*)    Protein, ur 100 (*)    Nitrite POSITIVE (*)    Leukocytes,Ua MODERATE (*)    Bacteria, UA MANY (*)    All other components within normal limits  CULTURE, BLOOD (ROUTINE X 2)  CULTURE, BLOOD (ROUTINE X 2)  URINE CULTURE  LACTIC ACID, PLASMA  LACTIC ACID, PLASMA     EKG None  Radiology DG Chest Port 1 View  Result Date: 03/23/2022 CLINICAL DATA:  ams EXAM: PORTABLE CHEST 1 VIEW COMPARISON:  Chest x-ray 09/18/2018 FINDINGS: The heart and mediastinal contours are unchanged. No focal consolidation. No pulmonary edema. No pleural effusion. No pneumothorax. No acute osseous abnormality.  IMPRESSION: No active disease. Electronically Signed   By: Iven Finn M.D.   On: 03/23/2022 20:53    Procedures Procedures    Medications Ordered in ED Medications  0.9 %  sodium chloride infusion ( Intravenous New Bag/Given 03/23/22 2051)  cefTRIAXone (ROCEPHIN) 2 g in sodium chloride 0.9 % 100 mL IVPB (2 g Intravenous New Bag/Given 03/23/22 2326)    ED Course/ Medical Decision Making/ A&P Clinical Course as of 03/24/22 0002  Wed Mar 23, 2022  2136 Bladder scan, 230 cc.  Foley catheter ordered [EW]    Clinical Course User Index [EW] Daleen Bo, MD                           Medical Decision Making Patient presenting with tender and swollen left epididymis and testicle.  Suspect epididymal orchitis.  Patient with fever.  Urinalysis abnormal.  [place] diagnosis includes sepsis, metabolic disorders, bladder outlet obstruction.  Amount and/or Complexity of Data Reviewed Independent Historian: caregiver    Details: He is a cogent historian.  Family member gives additional history Labs: ordered.    Details: CBC, metabolic panel, urinalysis-normal except potassium low, glucose high, calcium low, white count high, hemoglobin low, urinalysis consistent with infection.  Pending labs include blood and urine cultures. Radiology:     Details: Consider getting ultrasound however not currently available at this facility at this hour.  We will begin empiric treatment for epididymal orchitis. Discussion of management or test interpretation with external provider(s): Case discussed with Dr. Junious Silk, urologist, who agrees with hospitalization for treatment with  Rocephin and ultrasound in the morning to evaluate scrotal abnormality.  He states urology can be consulted as needed at that point.  Case discussed with hospitalist, she will admit.  Risk Prescription drug management. Decision regarding hospitalization. Risk Details: Patient presents for evaluation of difficulty urinating, and left testicle pain.  Findings consistent with epididymal orchitis.  Urine abnormal, consistent with infection.  Urine culture ordered.  Blood cultures ordered.  Doubt severe sepsis.  Patient will require hospitalization for management and further testing.  Critical Care Total time providing critical care: 45 minutes          Final Clinical Impression(s) / ED Diagnoses Final diagnoses:  Orchitis, epididymitis, and epididymo-orchitis, with abscess    Rx / DC Orders ED Discharge Orders     None         Daleen Bo, MD 03/24/22 0002

## 2022-03-24 ENCOUNTER — Inpatient Hospital Stay (HOSPITAL_COMMUNITY): Payer: Medicare HMO

## 2022-03-24 DIAGNOSIS — I10 Essential (primary) hypertension: Secondary | ICD-10-CM | POA: Diagnosis not present

## 2022-03-24 DIAGNOSIS — Z885 Allergy status to narcotic agent status: Secondary | ICD-10-CM | POA: Diagnosis not present

## 2022-03-24 DIAGNOSIS — Z87891 Personal history of nicotine dependence: Secondary | ICD-10-CM | POA: Diagnosis not present

## 2022-03-24 DIAGNOSIS — N433 Hydrocele, unspecified: Secondary | ICD-10-CM | POA: Diagnosis not present

## 2022-03-24 DIAGNOSIS — E876 Hypokalemia: Secondary | ICD-10-CM | POA: Diagnosis not present

## 2022-03-24 DIAGNOSIS — N390 Urinary tract infection, site not specified: Secondary | ICD-10-CM

## 2022-03-24 DIAGNOSIS — Z888 Allergy status to other drugs, medicaments and biological substances status: Secondary | ICD-10-CM | POA: Diagnosis not present

## 2022-03-24 DIAGNOSIS — E782 Mixed hyperlipidemia: Secondary | ICD-10-CM

## 2022-03-24 DIAGNOSIS — A419 Sepsis, unspecified organism: Secondary | ICD-10-CM

## 2022-03-24 DIAGNOSIS — N452 Orchitis: Secondary | ICD-10-CM

## 2022-03-24 DIAGNOSIS — E1169 Type 2 diabetes mellitus with other specified complication: Secondary | ICD-10-CM

## 2022-03-24 DIAGNOSIS — R4182 Altered mental status, unspecified: Secondary | ICD-10-CM | POA: Diagnosis not present

## 2022-03-24 DIAGNOSIS — I429 Cardiomyopathy, unspecified: Secondary | ICD-10-CM | POA: Diagnosis not present

## 2022-03-24 DIAGNOSIS — N503 Cyst of epididymis: Secondary | ICD-10-CM | POA: Diagnosis not present

## 2022-03-24 DIAGNOSIS — Z7984 Long term (current) use of oral hypoglycemic drugs: Secondary | ICD-10-CM | POA: Diagnosis not present

## 2022-03-24 DIAGNOSIS — G9341 Metabolic encephalopathy: Secondary | ICD-10-CM

## 2022-03-24 DIAGNOSIS — Z8673 Personal history of transient ischemic attack (TIA), and cerebral infarction without residual deficits: Secondary | ICD-10-CM | POA: Diagnosis not present

## 2022-03-24 DIAGNOSIS — Z79899 Other long term (current) drug therapy: Secondary | ICD-10-CM | POA: Diagnosis not present

## 2022-03-24 DIAGNOSIS — A4151 Sepsis due to Escherichia coli [E. coli]: Secondary | ICD-10-CM | POA: Diagnosis not present

## 2022-03-24 DIAGNOSIS — Z833 Family history of diabetes mellitus: Secondary | ICD-10-CM | POA: Diagnosis not present

## 2022-03-24 DIAGNOSIS — N453 Epididymo-orchitis: Secondary | ICD-10-CM | POA: Diagnosis not present

## 2022-03-24 DIAGNOSIS — Z794 Long term (current) use of insulin: Secondary | ICD-10-CM | POA: Diagnosis not present

## 2022-03-24 DIAGNOSIS — M1990 Unspecified osteoarthritis, unspecified site: Secondary | ICD-10-CM | POA: Diagnosis not present

## 2022-03-24 DIAGNOSIS — Z7982 Long term (current) use of aspirin: Secondary | ICD-10-CM | POA: Diagnosis not present

## 2022-03-24 DIAGNOSIS — Z791 Long term (current) use of non-steroidal anti-inflammatories (NSAID): Secondary | ICD-10-CM | POA: Diagnosis not present

## 2022-03-24 DIAGNOSIS — N5089 Other specified disorders of the male genital organs: Secondary | ICD-10-CM | POA: Diagnosis not present

## 2022-03-24 DIAGNOSIS — I252 Old myocardial infarction: Secondary | ICD-10-CM | POA: Diagnosis not present

## 2022-03-24 DIAGNOSIS — M109 Gout, unspecified: Secondary | ICD-10-CM | POA: Diagnosis not present

## 2022-03-24 DIAGNOSIS — I2510 Atherosclerotic heart disease of native coronary artery without angina pectoris: Secondary | ICD-10-CM | POA: Diagnosis not present

## 2022-03-24 DIAGNOSIS — E1151 Type 2 diabetes mellitus with diabetic peripheral angiopathy without gangrene: Secondary | ICD-10-CM | POA: Diagnosis not present

## 2022-03-24 LAB — CBC WITH DIFFERENTIAL/PLATELET
Abs Immature Granulocytes: 0.04 10*3/uL (ref 0.00–0.07)
Basophils Absolute: 0 10*3/uL (ref 0.0–0.1)
Basophils Relative: 1 %
Eosinophils Absolute: 0.1 10*3/uL (ref 0.0–0.5)
Eosinophils Relative: 1 %
HCT: 35.1 % — ABNORMAL LOW (ref 39.0–52.0)
Hemoglobin: 11.4 g/dL — ABNORMAL LOW (ref 13.0–17.0)
Immature Granulocytes: 1 %
Lymphocytes Relative: 10 %
Lymphs Abs: 0.8 10*3/uL (ref 0.7–4.0)
MCH: 27.9 pg (ref 26.0–34.0)
MCHC: 32.5 g/dL (ref 30.0–36.0)
MCV: 85.8 fL (ref 80.0–100.0)
Monocytes Absolute: 0.9 10*3/uL (ref 0.1–1.0)
Monocytes Relative: 13 %
Neutro Abs: 5.7 10*3/uL (ref 1.7–7.7)
Neutrophils Relative %: 74 %
Platelets: 187 10*3/uL (ref 150–400)
RBC: 4.09 MIL/uL — ABNORMAL LOW (ref 4.22–5.81)
RDW: 14.2 % (ref 11.5–15.5)
WBC: 7.6 10*3/uL (ref 4.0–10.5)
nRBC: 0 % (ref 0.0–0.2)

## 2022-03-24 LAB — COMPREHENSIVE METABOLIC PANEL
ALT: 24 U/L (ref 0–44)
AST: 25 U/L (ref 15–41)
Albumin: 3.1 g/dL — ABNORMAL LOW (ref 3.5–5.0)
Alkaline Phosphatase: 59 U/L (ref 38–126)
Anion gap: 6 (ref 5–15)
BUN: 19 mg/dL (ref 8–23)
CO2: 27 mmol/L (ref 22–32)
Calcium: 7.9 mg/dL — ABNORMAL LOW (ref 8.9–10.3)
Chloride: 103 mmol/L (ref 98–111)
Creatinine, Ser: 0.95 mg/dL (ref 0.61–1.24)
GFR, Estimated: >60 mL/min (ref >60)
Glucose, Bld: 135 mg/dL — ABNORMAL HIGH (ref 70–99)
Potassium: 3.6 mmol/L (ref 3.5–5.1)
Sodium: 136 mmol/L (ref 135–145)
Total Bilirubin: 1.2 mg/dL (ref 0.3–1.2)
Total Protein: 5.8 g/dL — ABNORMAL LOW (ref 6.5–8.1)

## 2022-03-24 LAB — HIV ANTIBODY (ROUTINE TESTING W REFLEX): HIV Screen 4th Generation wRfx: NONREACTIVE

## 2022-03-24 LAB — GLUCOSE, CAPILLARY
Glucose-Capillary: 116 mg/dL — ABNORMAL HIGH (ref 70–99)
Glucose-Capillary: 104 mg/dL — ABNORMAL HIGH (ref 70–99)
Glucose-Capillary: 98 mg/dL (ref 70–99)
Glucose-Capillary: 80 mg/dL (ref 70–99)

## 2022-03-24 LAB — MAGNESIUM: Magnesium: 1.9 mg/dL (ref 1.7–2.4)

## 2022-03-24 LAB — CBG MONITORING, ED: Glucose-Capillary: 145 mg/dL — ABNORMAL HIGH (ref 70–99)

## 2022-03-24 LAB — MRSA NEXT GEN BY PCR, NASAL: MRSA by PCR Next Gen: NOT DETECTED

## 2022-03-24 MED ORDER — OXYCODONE-ACETAMINOPHEN 10-325 MG PO TABS: 1.0000 | ORAL_TABLET | Freq: Four times a day (QID) | ORAL | Status: DC | PRN

## 2022-03-24 MED ORDER — HEPARIN SODIUM (PORCINE) 5000 UNIT/ML IJ SOLN
5000.0000 [IU] | Freq: Three times a day (TID) | INTRAMUSCULAR | Status: DC
Start: 2022-03-24 — End: 2022-03-25
  Administered 2022-03-24 – 2022-03-25 (×5): 5000 [IU] via SUBCUTANEOUS
  Filled 2022-03-24 (×5): qty 1

## 2022-03-24 MED ORDER — ACETAMINOPHEN 650 MG RE SUPP: 650.0000 mg | Freq: Four times a day (QID) | RECTAL | Status: DC | PRN

## 2022-03-24 MED ORDER — ASPIRIN 325 MG PO TABS
325.0000 mg | ORAL_TABLET | Freq: Every evening | ORAL | Status: DC
  Administered 2022-03-24: 325 mg via ORAL
  Filled 2022-03-24: qty 1

## 2022-03-24 MED ORDER — ONDANSETRON HCL 4 MG PO TABS: 4.0000 mg | ORAL_TABLET | Freq: Four times a day (QID) | ORAL | Status: DC | PRN

## 2022-03-24 MED ORDER — ONDANSETRON HCL 4 MG/2ML IJ SOLN: 4.0000 mg | Freq: Four times a day (QID) | INTRAMUSCULAR | Status: DC | PRN

## 2022-03-24 MED ORDER — HYDROCHLOROTHIAZIDE 25 MG PO TABS
25.0000 mg | ORAL_TABLET | Freq: Every day | ORAL | Status: DC
  Administered 2022-03-24 – 2022-03-25 (×2): 25 mg via ORAL
  Filled 2022-03-24 (×2): qty 1

## 2022-03-24 MED ORDER — SODIUM CHLORIDE 0.9 % IV SOLN
1.0000 g | INTRAVENOUS | Status: DC
  Administered 2022-03-24: 1 g via INTRAVENOUS
  Filled 2022-03-24: qty 10

## 2022-03-24 MED ORDER — LISINOPRIL-HYDROCHLOROTHIAZIDE 20-25 MG PO TABS: 1.0000 | ORAL_TABLET | Freq: Every day | ORAL | Status: DC

## 2022-03-24 MED ORDER — INSULIN DETEMIR 100 UNIT/ML ~~LOC~~ SOLN
50.0000 [IU] | Freq: Every day | SUBCUTANEOUS | Status: DC
  Administered 2022-03-24: 50 [IU] via SUBCUTANEOUS
  Filled 2022-03-24 (×3): qty 0.5

## 2022-03-24 MED ORDER — MORPHINE SULFATE (PF) 2 MG/ML IV SOLN
2.0000 mg | INTRAVENOUS | Status: DC | PRN
  Administered 2022-03-24: 2 mg via INTRAVENOUS

## 2022-03-24 MED ORDER — LISINOPRIL 10 MG PO TABS
20.0000 mg | ORAL_TABLET | Freq: Every day | ORAL | Status: DC
  Administered 2022-03-24 – 2022-03-25 (×2): 20 mg via ORAL
  Filled 2022-03-24 (×2): qty 2

## 2022-03-24 MED ORDER — OXYCODONE-ACETAMINOPHEN 5-325 MG PO TABS
1.0000 | ORAL_TABLET | Freq: Four times a day (QID) | ORAL | Status: DC | PRN
  Administered 2022-03-24: 1 via ORAL
  Filled 2022-03-24: qty 1

## 2022-03-24 MED ORDER — POTASSIUM CHLORIDE 20 MEQ PO PACK
20.0000 meq | PACK | Freq: Once | ORAL | Status: AC
  Administered 2022-03-24: 20 meq via ORAL
  Filled 2022-03-24: qty 1

## 2022-03-24 MED ORDER — ACETAMINOPHEN 325 MG PO TABS: 650.0000 mg | ORAL_TABLET | Freq: Four times a day (QID) | ORAL | Status: DC | PRN

## 2022-03-24 MED ORDER — INSULIN ASPART 100 UNIT/ML IJ SOLN: 0.0000 [IU] | Freq: Every day | INTRAMUSCULAR | Status: DC

## 2022-03-24 MED ORDER — MORPHINE SULFATE (PF) 2 MG/ML IV SOLN
INTRAVENOUS | Status: AC
  Filled 2022-03-24: qty 1

## 2022-03-24 MED ORDER — NYSTATIN 100000 UNIT/GM EX POWD
Freq: Three times a day (TID) | CUTANEOUS | Status: DC
  Filled 2022-03-24: qty 15

## 2022-03-24 MED ORDER — INSULIN ASPART 100 UNIT/ML IJ SOLN
0.0000 [IU] | Freq: Three times a day (TID) | INTRAMUSCULAR | Status: DC
  Administered 2022-03-25: 2 [IU] via SUBCUTANEOUS

## 2022-03-24 MED ORDER — ATORVASTATIN CALCIUM 10 MG PO TABS
10.0000 mg | ORAL_TABLET | Freq: Every day | ORAL | Status: DC
  Administered 2022-03-24 – 2022-03-25 (×2): 10 mg via ORAL
  Filled 2022-03-24 (×2): qty 1

## 2022-03-24 MED ORDER — POLYETHYLENE GLYCOL 3350 17 G PO PACK
17.0000 g | PACK | Freq: Every day | ORAL | Status: DC | PRN
Start: 2022-03-24 — End: 2022-03-25

## 2022-03-24 MED ORDER — OXYCODONE HCL 5 MG PO TABS
5.0000 mg | ORAL_TABLET | Freq: Four times a day (QID) | ORAL | Status: DC | PRN
  Administered 2022-03-24: 5 mg via ORAL
  Filled 2022-03-24: qty 1

## 2022-03-24 MED ORDER — CHLORHEXIDINE GLUCONATE CLOTH 2 % EX PADS
6.0000 | MEDICATED_PAD | Freq: Every day | CUTANEOUS | Status: DC
  Administered 2022-03-24 – 2022-03-25 (×2): 6 via TOPICAL

## 2022-03-24 NOTE — Assessment & Plan Note (Signed)
Patient initially presented with confusion -Confusion seems to have cleared with treatment of sepsis -Likely secondary to sepsis -No further work-up at this time

## 2022-03-24 NOTE — Progress Notes (Signed)
  Transition of Care [facility]) Screening Note   Patient Details  Name: [name] Date of Birth: 1956/07/17   Transition of Care [facility]) CM/SW Contact:    Iona Beard, [place] Phone Number: 03/24/2022, 11:34 AM    Transition of Care Department [facility]) has reviewed patient and no TOC needs have been identified at this time. We will continue to monitor patient advancement through interdisciplinary progression rounds. If new patient transition needs arise, please place a TOC consult.

## 2022-03-24 NOTE — Assessment & Plan Note (Signed)
70 units basal insulin at baseline We will continue with reduced dose of basal insulin and sliding scale coverage Last hemoglobin A1c 7.3 Carb modified diet Continue to monitor

## 2022-03-24 NOTE — Assessment & Plan Note (Signed)
- 

## 2022-03-24 NOTE — H&P (Signed)
History and Physical    Patient: [name] WJX:[id number] DOB: 20-Sep-1956 DOA: 03/23/2022 DOS: the patient was seen and examined on 03/24/2022 PCP: Kathyrn Drown, MD  Patient coming from: Home  Chief Complaint:  Chief Complaint  Patient presents with   Altered Mental Status   HPI: [name] is a 66 y.o. male with medical history significant of coronary artery disease, cardiomyopathy, diabetes mellitus type 2, hyperlipidemia, hypertension, peripheral vascular disease, stroke, and more presents the ED with a chief complaint of left testicular pain.  Initially the complaint was confusion, the patient is no longer confused at the time of my exam and complains of left testicular pain.  He reports that it started 2 days ago.  He had no trauma to the area.  The pain is got worse since it started.  Pain feels sharp and burning.  He had urinary retention and reports that he has not been able to void in 2 days.  He has had no hematuria.  Patient reports subjective fever and chills at home.  He has had a decreased appetite.  His last bowel movement was today.  It was normal for him.  Patient has no other complaints at this time.  Patient does not smoke, does not drink alcohol, smokes marijuana-last use same day's presentation.  He is vaccinated for COVID.  Patient is full code. Review of Systems: As mentioned in the history of present illness. All other systems reviewed and are negative. Past Medical History:  Diagnosis Date   CAD (coronary artery disease)    BMS to RCA 2008   Cardiomyopathy [facility])    Depression    DJD (degenerative joint disease)    DM type 2 with diabetic mixed hyperlipidemia ([place]) 12/11/2018   Essential hypertension    Gout    History of inferior wall myocardial infarction 2008   Hypercholesterolemia    Hyperlipidemia    Obesity    Oral candidiasis    Peripheral vascular disease ([place])    Stroke [facility])    December 2019   Type 2 diabetes mellitus [facility])    Past  Surgical History:  Procedure Laterality Date   AMPUTATION Right 01/12/2015   Procedure: PARTIAL AMPUTATION 1ST RAY RIGHT FOOT (Amputation of right great toe and portion of the first metatarsal bone right foot);  Surgeon: Caprice Beaver, DPM;  Location: [facility] ORS;  Service: Podiatry;  Laterality: Right;   AMPUTATION Right 09/14/2016   Procedure: PARTIAL AMPUTATION RAY 2ND TOE RIGHT FOOT;  Surgeon: Caprice Beaver, DPM;  Location: [facility] ORS;  Service: Podiatry;  Laterality: Right;   COLONOSCOPY  08/2008   COLONOSCOPY WITH PROPOFOL N/A 06/02/2020   Procedure: COLONOSCOPY WITH PROPOFOL;  Surgeon: [name], DO;  Location: [facility] ENDO SUITE;  Service: Endoscopy;  Laterality: N/A;  7:30am   ESOPHAGOGASTRODUODENOSCOPY  08/2008   LAMINOTOMY  2001, 2006     Right L4-5 interlaminar laminotomy with excision of herniated   disc with operating microscope.   NECK SURGERY     cervical disc   ORIF WRIST FRACTURE Right    PERIPHERAL VASCULAR CATHETERIZATION N/A 02/27/2015   Procedure: Abdominal Aortogram;  Surgeon: Elam Dutch, MD;  Location: [place] CV LAB;  Service: Cardiovascular;  Laterality: N/A;   PERIPHERAL VASCULAR CATHETERIZATION Bilateral 02/27/2015   Procedure: Lower Extremity Angiography;  Surgeon: Elam Dutch, MD;  Location: [place] CV LAB;  Service: Cardiovascular;  Laterality: Bilateral;   POLYPECTOMY  06/02/2020   Procedure: POLYPECTOMY;  Surgeon: [name],  DO;  Location: [facility];  Service: Endoscopy;;   Right ankle fracture open reduction internal fixation.  2003   WOUND DEBRIDEMENT Right 12/24/2014   Procedure: DEBRIDEMENT SOFT TISSUE AND BONE RIGHT FOOT;  Surgeon: Caprice Beaver, DPM;  Location: [facility] ORS;  Service: Podiatry;  Laterality: Right;   Social History:  reports that he quit smoking about 51 years ago. His smoking use included cigarettes. He has a 10.00 pack-year smoking history. He has never used smokeless tobacco. He reports current drug use. Drug:  Marijuana. He reports that he does not drink alcohol.  Allergies  Allergen Reactions   Metformin And Related     GI symptoms   Statins Other (See Comments)    Legs ache, he states Crestor cause shoulder pain, states Crestor caused rash on hands   Vicodin [Hydrocodone-Acetaminophen] Nausea Only    Family History  Problem Relation Age of Onset   Diabetes Mother    Colon cancer Mother    Diabetes Father    Lung cancer Father    Cancer Sister    Diabetes Sister    Heart disease Sister        before age 78    Prior to Admission medications   Medication Sig Start Date End Date Taking? Authorizing Provider  aspirin 325 MG tablet Take 325 mg by mouth every evening.     [provider]  atorvastatin (LIPITOR) 10 MG tablet TAKE ONE TABLET BY MOUTH ON MONDAY AND FRIDAY 11/09/21   Kathyrn Drown, MD  betamethasone valerate ointment (VALISONE) 0.1 % APPLY 1 APPLICATION TOPICALLY 2 (TWO) TIMES DAILY AS NEEDED. Patient taking differently: Apply 1 application. topically 2 (two) times daily as needed (Psoriasis). 11/16/19   Kathyrn Drown, MD  ergocalciferol (VITAMIN D2) 1.25 MG (50000 UT) capsule Take 1 capsule by mouth once a week. 06/09/21   [provider]  furosemide (LASIX) 20 MG tablet Take 20 mg by mouth daily as needed. For fluid retention    [provider]  insulin detemir (LEVEMIR FLEXTOUCH) 100 UNIT/ML FlexPen INJECT 54 UNITS AT BEDTIME - MAY TITRATE UP TO 70 UNITS INTO SKIN AT BEDTIME FOR DIABETES 03/15/22   Kathyrn Drown, MD  Insulin Pen Needle (B-D ULTRAFINE III SHORT PEN) 31G X 8 MM MISC USE PEN NEEDLES DAILY WITH LANTUS 01/13/22   Luking, Scott A, MD  KLOR-CON M20 20 MEQ tablet TAKE 1 TABLET (20 MEQ TOTAL) BY MOUTH DAILY. WHEN TAKING LASIX Patient taking differently: Take 20 mEq by mouth daily as needed (With Furosemide). 02/19/20   Kathyrn Drown, MD  lisinopril-hydrochlorothiazide (ZESTORETIC) 20-25 MG tablet Take 1 tablet by mouth daily. 12/30/19    [provider]  meloxicam (MOBIC) 15 MG tablet Take 15 mg by mouth daily. 08/15/21   [provider]  metFORMIN (GLUCOPHAGE-XR) 500 MG 24 hr tablet TAKE 1 TABLET BY MOUTH EVERY DAY WITH BREAKFAST 12/27/21   Kathyrn Drown, MD  Multiple Vitamins-Minerals (MULTIVITAMIN WITH MINERALS) tablet Take 1 tablet by mouth daily.    [provider]  oxyCODONE-acetaminophen (PERCOCET) 10-325 MG tablet Take one tablet by mouth every 6 hours prn pain 02/08/22   Kathyrn Drown, MD  oxyCODONE-acetaminophen (PERCOCET) 10-325 MG tablet Take one tablet by mouth every 6 hours prn pain 02/08/22   Kathyrn Drown, MD  oxyCODONE-acetaminophen (PERCOCET) 10-325 MG tablet Take one tablet by mouth every 6 hours prn pain 02/08/22   Kathyrn Drown, MD    Physical Exam: Vitals:  03/23/22 2059 03/23/22 2130 03/23/22 2200 03/24/22 0230  BP:  123/84 128/87 114/67  Pulse:  91 89 75  Resp:  18 (!) 31 (!) 27  Temp: (!) 101.5 F (38.6 C)     TempSrc: Rectal     SpO2:  98% 98% 96%  Weight:      Height:       1.  General: Patient lying supine in bed,  no acute distress   2. Psychiatric: Alert and oriented x 3, mood and behavior normal for situation, pleasant and cooperative with exam   3. Neurologic: Speech and language are normal, face is symmetric, moves all 4 extremities voluntarily, at baseline without acute deficits on limited exam   4. HEENMT:  Head is atraumatic, normocephalic, pupils reactive to light, neck is supple, trachea is midline, mucous membranes are moist   5. Respiratory : Lungs are clear to auscultation bilaterally without wheezing, rhonchi, rales, no cyanosis, no increase in work of breathing or accessory muscle use   6. Cardiovascular : Heart rate normal, rhythm is regular, no murmurs, rubs or gallops, no peripheral edema, peripheral pulses palpated   7. Gastrointestinal:  Abdomen is soft, nondistended, nontender to palpation bowel sounds active, no masses or  organomegaly palpated  GU: Left testicular swelling and tenderness to palpation, yeast dermatitis in the groin creases.   8. Skin:  Skin is warm, he is dermatitis and then groin, no other acute lesions, or ulcers on limited exam   9.Musculoskeletal:  No acute deformities or trauma, no asymmetry in tone, no peripheral edema, peripheral pulses palpated, no tenderness to palpation in the extremities  Data Reviewed: In the ED Temp 100.5-101.5, heart rate 89-104, respiratory rate 18-31, blood pressure 128/87, satting 98% No leukocytosis with white blood cell count of 10.7, hemoglobin 11.7, platelets 202 Chemistry panel shows a hypokalemia at 3.4 and a hyperglycemia at 184 UA is indicative of UTI Urine culture pending Blood culture pending Chest x-ray is negative for acute findings Urology was consulted regarding epididymal orchitis and advised to continue antibiotics for UTI and get an ultrasound in the a.m.-okay to admit here to [facility] Admission was requested for orchitis Assessment and Plan: * Sepsis secondary to UTI ([place]) - Temp 101.5, heart rate 104, respiratory rate 31 -Source UTI -Urine culture pending, blood culture pending -Patient initially presented with altered mental status -Chest x-ray is negative for acute findings -Patient has associated orchitis and had some urinary retention -Foley placed, urology consulted and advised continuing antibiotics and get an ultrasound in the a.m. -Continue Rocephin -Continue pain control with pain scale -Continue to monitor  Acute metabolic encephalopathy Patient initially presented with confusion -Confusion seems to have cleared with treatment of sepsis -Likely secondary to sepsis -No further work-up at this time  Hypokalemia - Potassium 3.4 -20 mEq p.o. potassium ordered -Recheck in the a.m.  Orchitis Orchitis secondary to UTI See plan for sepsis  Essential hypertension, benign Continue lisinopril and  hydrochlorothiazide  DM type 2 with diabetic mixed hyperlipidemia (HCC) 70 units basal insulin at baseline We will continue with reduced dose of basal insulin and sliding scale coverage Last hemoglobin A1c 7.3 Carb modified diet Continue to monitor  Mixed hyperlipidemia - Continue statin medication      Advance Care Planning:   Code Status: Full Code   Consults: Urology  Family Communication: No family at bedside  Severity of Illness: The appropriate patient status for this patient is INPATIENT. Inpatient status is judged to be reasonable and necessary in  order to provide the required intensity of service to ensure the patient's safety. The patient's presenting symptoms, physical exam findings, and initial radiographic and laboratory data in the context of their chronic comorbidities is felt to place them at high risk for further clinical deterioration. Furthermore, it is not anticipated that the patient will be medically stable for discharge from the hospital within 2 midnights of admission.   * I certify that at the point of admission it is my clinical judgment that the patient will require inpatient hospital care spanning beyond 2 midnights from the point of admission due to high intensity of service, high risk for further deterioration and high frequency of surveillance required.*  Author: Rolla Plate, DO 03/24/2022 3:04 AM  For on call review [email or web address].

## 2022-03-24 NOTE — Assessment & Plan Note (Signed)
-   Temp 101.5, heart rate 104, respiratory rate 31 -Source UTI -Urine culture pending, blood culture pending -Patient initially presented with altered mental status -Chest x-ray is negative for acute findings -Patient has associated orchitis and had some urinary retention -Foley placed, urology consulted and advised continuing antibiotics and get an ultrasound in the a.m. -Continue Rocephin -Continue pain control with pain scale -Continue to monitor

## 2022-03-24 NOTE — Assessment & Plan Note (Signed)
-   Potassium 3.4 -20 mEq p.o. potassium ordered -Recheck in the a.m.

## 2022-03-24 NOTE — Plan of Care (Signed)

## 2022-03-24 NOTE — ED Notes (Signed)
ED TO INPATIENT HANDOFF REPORT  ED Nurse Name and Phone #:   S Name/Age/Gender [name] 66 y.o. male Room/Bed: APA09/APA09  Code Status   Code Status: Full Code  Home/SNF/Other Home Patient oriented to: self and place Is this baseline? Yes   Triage Complete: Triage complete  Chief Complaint Sepsis ([place]) [A41.9]  Triage Note Pt BIB [facility] for confusion reported by wife; pt reports he has been confused since previous stroke; pt reports he has TIA's, pt alert and oriented to person and place, pt reports concern for decreased and painful urination with swelling to left testicles x 2 days   Allergies Allergies  Allergen Reactions   Metformin And Related     GI symptoms   Statins Other (See Comments)    Legs ache, he states Crestor cause shoulder pain, states Crestor caused rash on hands   Vicodin [Hydrocodone-Acetaminophen] Nausea Only    Level of Care/Admitting Diagnosis ED Disposition     ED Disposition  Admit   Condition  --   [place]: [facility] [[id number]]  Level of Care: Stepdown [14]  Covid Evaluation: Asymptomatic - no recent exposure (last 10 days) testing not required  Diagnosis: Sepsis [facility]) [[id number]]  Admitting Physician: Rolla Plate [[id number]]  Attending Physician: Rolla Plate [[id number]]  Estimated length of stay: past midnight tomorrow  Certification:: I certify this patient will need inpatient services for at least 2 midnights          B Medical/Surgery History Past Medical History:  Diagnosis Date   CAD (coronary artery disease)    BMS to RCA 2008   Cardiomyopathy ([place])    Depression    DJD (degenerative joint disease)    DM type 2 with diabetic mixed hyperlipidemia ([place]) 12/11/2018   Essential hypertension    Gout    History of inferior wall myocardial infarction 2008   Hypercholesterolemia    Hyperlipidemia    Obesity    Oral candidiasis    Peripheral vascular disease ([place])    Stroke  [facility])    December 2019   Type 2 diabetes mellitus [facility])    Past Surgical History:  Procedure Laterality Date   AMPUTATION Right 01/12/2015   Procedure: PARTIAL AMPUTATION 1ST RAY RIGHT FOOT (Amputation of right great toe and portion of the first metatarsal bone right foot);  Surgeon: Caprice Beaver, DPM;  Location: [facility] ORS;  Service: Podiatry;  Laterality: Right;   AMPUTATION Right 09/14/2016   Procedure: PARTIAL AMPUTATION RAY 2ND TOE RIGHT FOOT;  Surgeon: Caprice Beaver, DPM;  Location: [facility] ORS;  Service: Podiatry;  Laterality: Right;   COLONOSCOPY  08/2008   COLONOSCOPY WITH PROPOFOL N/A 06/02/2020   Procedure: COLONOSCOPY WITH PROPOFOL;  Surgeon: [name], DO;  Location: [facility];  Service: Endoscopy;  Laterality: N/A;  7:30am   ESOPHAGOGASTRODUODENOSCOPY  08/2008   LAMINOTOMY  2001, 2006     Right L4-5 interlaminar laminotomy with excision of herniated   disc with operating microscope.   NECK SURGERY     cervical disc   ORIF WRIST FRACTURE Right    PERIPHERAL VASCULAR CATHETERIZATION N/A 02/27/2015   Procedure: Abdominal Aortogram;  Surgeon: Elam Dutch, MD;  Location: [place] CV LAB;  Service: Cardiovascular;  Laterality: N/A;   PERIPHERAL VASCULAR CATHETERIZATION Bilateral 02/27/2015   Procedure: Lower Extremity Angiography;  Surgeon: Elam Dutch, MD;  Location: [place] CV LAB;  Service: Cardiovascular;  Laterality: Bilateral;   POLYPECTOMY  06/02/2020   Procedure:  POLYPECTOMY;  Surgeon: [name], DO;  Location: [facility];  Service: Endoscopy;;   Right ankle fracture open reduction internal fixation.  2003   WOUND DEBRIDEMENT Right 12/24/2014   Procedure: DEBRIDEMENT SOFT TISSUE AND BONE RIGHT FOOT;  Surgeon: Caprice Beaver, DPM;  Location: [facility];  Service: Podiatry;  Laterality: Right;     A IV Location/Drains/Wounds Patient Lines/Drains/Airways Status     Active Line/Drains/Airways     Name Placement date Placement time Site  Days   Peripheral IV 03/23/22 22 G Right Antecubital 03/23/22  2046  Antecubital  1   Peripheral IV 03/23/22 20 G Anterior;Right Forearm 03/23/22  2047  Forearm  1   Urethral Catheter AC, RN Latex 16 Fr. 03/23/22  2206  Latex  1   Incision (Closed) 12/24/14 Foot Right 12/24/14  1235  -- 2647   Incision (Closed) 01/12/15 Foot Right 01/12/15  1133  -- 2628   Incision (Closed) 09/14/16 Foot Right 09/14/16  1632  -- 2017            Intake/Output Last 24 hours No intake or output data in the 24 hours ending 03/24/22 0316  Labs/Imaging Results for orders placed or performed during the hospital encounter of 03/23/22 (from the past 48 hour(s))  Basic metabolic panel     Status: Abnormal   Collection Time: 03/23/22  8:46 PM  Result Value Ref Range   Sodium 135 135 - 145 mmol/L   Potassium 3.4 (L) 3.5 - 5.1 mmol/L   Chloride 102 98 - 111 mmol/L   CO2 25 22 - 32 mmol/L   Glucose, Bld 184 (H) 70 - 99 mg/dL    Comment: Glucose reference range applies only to samples taken after fasting for at least 8 hours.   BUN 21 8 - 23 mg/dL   Creatinine, Ser 1.00 0.61 - 1.24 mg/dL   Calcium 8.2 (L) 8.9 - 10.3 mg/dL   GFR, Estimated >60 >60 mL/min    Comment: (NOTE) Calculated using the CKD-EPI Creatinine Equation (2021)    Anion gap 8 5 - 15    Comment: Performed at [facility], [address] [place], [place] [address]  CBC with Differential     Status: Abnormal   Collection Time: 03/23/22  8:46 PM  Result Value Ref Range   WBC 10.7 (H) 4.0 - 10.5 K/uL   RBC 4.25 4.22 - 5.81 MIL/uL   Hemoglobin 11.7 (L) 13.0 - 17.0 g/dL   HCT 35.7 (L) 39.0 - 52.0 %   MCV 84.0 80.0 - 100.0 fL   MCH 27.5 26.0 - 34.0 pg   MCHC 32.8 30.0 - 36.0 g/dL   RDW 14.2 11.5 - 15.5 %   Platelets 202 150 - 400 K/uL   nRBC 0.0 0.0 - 0.2 %   Neutrophils Relative % 86 %   Neutro Abs 9.1 (H) 1.7 - 7.7 K/uL   Lymphocytes Relative 4 %   Lymphs Abs 0.5 (L) 0.7 - 4.0 K/uL   Monocytes Relative 9 %   Monocytes Absolute 1.0  0.1 - 1.0 K/uL   Eosinophils Relative 0 %   Eosinophils Absolute 0.0 0.0 - 0.5 K/uL   Basophils Relative 0 %   Basophils Absolute 0.0 0.0 - 0.1 K/uL   Immature Granulocytes 1 %   Abs Immature Granulocytes 0.08 (H) 0.00 - 0.07 K/uL    Comment: Performed at [facility], [address], [place], [place] [address]  Culture, blood (routine x 2)     Status:  None (Preliminary result)   Collection Time: 03/23/22  8:46 PM   Specimen: Right Antecubital; Blood  Result Value Ref Range   Specimen Description RIGHT ANTECUBITAL    Special Requests      BOTTLES DRAWN AEROBIC AND ANAEROBIC Blood Culture adequate volume Performed at [facility], [address], [place], [place] [address]    Culture PENDING    Report Status PENDING   Culture, blood (routine x 2)     Status: None (Preliminary result)   Collection Time: 03/23/22  8:46 PM   Specimen: Left Antecubital; Blood  Result Value Ref Range   Specimen Description LEFT ANTECUBITAL    Special Requests      BOTTLES DRAWN AEROBIC AND ANAEROBIC Blood Culture adequate volume Performed at [facility], [address] [place], [place] [address]    Culture PENDING    Report Status PENDING   Lactic acid, plasma     Status: None   Collection Time: 03/23/22  8:46 PM  Result Value Ref Range   Lactic Acid, Venous 1.1 0.5 - 1.9 mmol/L    Comment: Performed at [facility], [address], [place], [place] [address]  Urinalysis, Routine w reflex microscopic Urine, Catheterized     Status: Abnormal   Collection Time: 03/23/22 10:06 PM  Result Value Ref Range   Color, Urine AMBER (A) YELLOW    Comment: BIOCHEMICALS MAY BE AFFECTED BY COLOR   APPearance HAZY (A) CLEAR   Specific Gravity, Urine 1.021 1.005 - 1.030   pH 5.0 5.0 - 8.0   Glucose, UA NEGATIVE NEGATIVE mg/dL   Hgb urine dipstick MODERATE (A) NEGATIVE   Bilirubin Urine NEGATIVE NEGATIVE   Ketones, ur 20 (A) NEGATIVE mg/dL   Protein, ur 100 (A) NEGATIVE mg/dL   Nitrite POSITIVE (A) NEGATIVE    Leukocytes,Ua MODERATE (A) NEGATIVE   RBC / HPF 11-20 0 - 5 RBC/hpf   WBC, UA 21-50 0 - 5 WBC/hpf   Bacteria, UA MANY (A) NONE SEEN   WBC Clumps PRESENT    Mucus PRESENT    Hyaline Casts, UA PRESENT     Comment: Performed at [facility], [address], [place], [place] [address]  Lactic acid, plasma     Status: None   Collection Time: 03/23/22 10:35 PM  Result Value Ref Range   Lactic Acid, Venous 1.1 0.5 - 1.9 mmol/L    Comment: Performed at [facility], [address], [place], [place] [address]   DG Chest Port 1 View  Result Date: 03/23/2022 CLINICAL DATA:  ams EXAM: PORTABLE CHEST 1 VIEW COMPARISON:  Chest x-ray 09/18/2018 FINDINGS: The heart and mediastinal contours are unchanged. No focal consolidation. No pulmonary edema. No pleural effusion. No pneumothorax. No acute osseous abnormality. IMPRESSION: No active disease. Electronically Signed   By: Iven Finn M.D.   On: 03/23/2022 20:53    Pending Labs Unresulted Labs (From admission, onward)     Start     Ordered   03/24/22 0500  Comprehensive metabolic panel  Tomorrow morning,   R        03/24/22 0046   03/24/22 0500  Magnesium  Tomorrow morning,   R        03/24/22 0046   03/24/22 0500  CBC with Differential/Platelet  Tomorrow morning,   R        03/24/22 0046   03/24/22 0047  HIV Antibody (routine testing w rflx)  (HIV Antibody (Routine testing w reflex) panel)  Once,   R  03/24/22 0046   03/23/22 2206  Urine Culture  Once,   URGENT       Question:  Indication  Answer:  Dysuria   03/23/22 2205            Vitals/Pain Today's Vitals   03/24/22 0130 03/24/22 0200 03/24/22 0230 03/24/22 0300  BP: 110/66 110/69 114/67 108/68  Pulse: 77 76 75 69  Resp: 16  (!) 27 (!) 23  Temp:      TempSrc:      SpO2: 94% 95% 96% 97%  Weight:      Height:      PainSc:        Isolation Precautions No active isolations  Medications Medications  0.9 %  sodium chloride infusion ( Intravenous New Bag/Given 03/23/22  2051)  aspirin tablet 325 mg (has no administration in time range)  atorvastatin (LIPITOR) tablet 10 mg (has no administration in time range)  lisinopril-hydrochlorothiazide (ZESTORETIC) 20-25 MG per tablet 1 tablet (has no administration in time range)  insulin detemir (LEVEMIR) injection 50 Units (has no administration in time range)  insulin aspart (novoLOG) injection 0-15 Units (has no administration in time range)  insulin aspart (novoLOG) injection 0-5 Units (has no administration in time range)  heparin injection 5,000 Units (has no administration in time range)  acetaminophen (TYLENOL) tablet 650 mg (has no administration in time range)    Or  acetaminophen (TYLENOL) suppository 650 mg (has no administration in time range)  morphine (PF) 2 MG/ML injection 2 mg (2 mg Intravenous Given 03/24/22 0049)  ondansetron (ZOFRAN) tablet 4 mg (has no administration in time range)    Or  ondansetron (ZOFRAN) injection 4 mg (has no administration in time range)  polyethylene glycol (MIRALAX / GLYCOLAX) packet 17 g (has no administration in time range)  potassium chloride (KLOR-CON) packet 20 mEq (has no administration in time range)  cefTRIAXone (ROCEPHIN) 1 g in sodium chloride 0.9 % 100 mL IVPB (has no administration in time range)  oxyCODONE-acetaminophen (PERCOCET/ROXICET) 5-325 MG per tablet 1 tablet (has no administration in time range)    And  oxyCODONE (Oxy IR/ROXICODONE) immediate release tablet 5 mg (has no administration in time range)  nystatin (MYCOSTATIN/NYSTOP) topical powder (has no administration in time range)  cefTRIAXone (ROCEPHIN) 2 g in sodium chloride 0.9 % 100 mL IVPB (0 g Intravenous Stopped 03/24/22 0051)    Mobility walks with person assist Moderate fall risk   Focused Assessments    R Recommendations: See Admitting Provider Note  Report given to:   Additional Notes:

## 2022-03-24 NOTE — Progress Notes (Signed)
Patient admitted to the hospital earlier this morning by Dr. Clearence Ped  Patient seen and examined.  Continues to have pain in his left testicular area.  Noted to have erythema in his scrotum.  He has been admitted to the hospital with orchitis/epididymitis.  This is confirmed on scrotal ultrasound.  He is on IV antibiotics.  Urine cultures in process.  He initially felt as though he had difficulty passing urine and bladder scan showed 250 cc of urine.  Foley catheter was placed on admission, but since he did not have significant bladder volume, will discontinue further catheter for now.  [employer]

## 2022-03-24 NOTE — Assessment & Plan Note (Signed)
Orchitis secondary to UTI See plan for sepsis

## 2022-03-24 NOTE — Progress Notes (Signed)
Blood Sugar 80, Patient is scheduled to receive Levemir 50 Units tonight. On call MD messaged and advised RN to hold Levemir tonight.

## 2022-03-24 NOTE — Assessment & Plan Note (Signed)
-   Continue statin medication  

## 2022-03-25 DIAGNOSIS — G9341 Metabolic encephalopathy: Secondary | ICD-10-CM | POA: Diagnosis not present

## 2022-03-25 DIAGNOSIS — A419 Sepsis, unspecified organism: Secondary | ICD-10-CM | POA: Diagnosis not present

## 2022-03-25 DIAGNOSIS — N390 Urinary tract infection, site not specified: Secondary | ICD-10-CM | POA: Diagnosis not present

## 2022-03-25 DIAGNOSIS — E782 Mixed hyperlipidemia: Secondary | ICD-10-CM | POA: Diagnosis not present

## 2022-03-25 DIAGNOSIS — E1169 Type 2 diabetes mellitus with other specified complication: Secondary | ICD-10-CM | POA: Diagnosis not present

## 2022-03-25 DIAGNOSIS — I10 Essential (primary) hypertension: Secondary | ICD-10-CM | POA: Diagnosis not present

## 2022-03-25 LAB — GLUCOSE, CAPILLARY
Glucose-Capillary: 108 mg/dL — ABNORMAL HIGH (ref 70–99)
Glucose-Capillary: 131 mg/dL — ABNORMAL HIGH (ref 70–99)
Glucose-Capillary: 112 mg/dL — ABNORMAL HIGH (ref 70–99)

## 2022-03-25 MED ORDER — CIPROFLOXACIN HCL 500 MG PO TABS
500.0000 mg | ORAL_TABLET | Freq: Two times a day (BID) | ORAL | 0 refills | Status: AC
Start: 2022-03-25 — End: 2022-04-04

## 2022-03-25 MED ORDER — NYSTATIN 100000 UNIT/GM EX POWD: Freq: Three times a day (TID) | CUTANEOUS | 0 refills | Status: DC

## 2022-03-25 MED ORDER — FUROSEMIDE 20 MG PO TABS: 20.0000 mg | ORAL_TABLET | Freq: Every day | ORAL | 0 refills | Status: DC

## 2022-03-25 NOTE — Discharge Summary (Signed)
Physician Discharge Summary  [name] ZOX:[id number] DOB: [birth year] DOA: 03/23/2022  PCP: Kathyrn Drown, MD  Admit date: 03/23/2022 Discharge date: 03/25/2022  Admitted From: Home Disposition: Home  Recommendations for Outpatient Follow-up:  Follow up with PCP in 1-2 weeks Please obtain BMP/CBC in one week  Home Health: Equipment/Devices:  Discharge Condition: Stable CODE STATUS: Full code Diet recommendation: Heart healthy, carb modified  Brief/Interim Summary: 66 year old male with a history of diabetes, hyperlipidemia, hypertension, admitted to the hospital with left testicular pain.  He had scrotal ultrasound and was found to have epididymitis and orchitis.  Urine culture was checked and noted to be positive for E. coli.  Initially treated with Rocephin with improvement of his erythema/tenderness in his scrotal area.  He has been transitioned to ciprofloxacin for empiric treatment pending sensitivities.  He is noted to have some lower extremity edema and was started on short course of Lasix.  Denies any shortness of breath.  There was initial concern that he may have urinary retention and Foley catheter was placed.  This was discontinued shortly after admission and has been able to pass urine since then.  He can follow-up with urology as needed.  Otherwise he can see his primary care physician in the next 1 to 2 weeks for follow-up.  The remainder of his medical issues remained stable.  Discharge Diagnoses:  Principal Problem:   Sepsis secondary to UTI [facility]) Active Problems:   Mixed hyperlipidemia   DM type 2 with diabetic mixed hyperlipidemia ([place])   Essential hypertension, benign   Orchitis   Hypokalemia   Acute metabolic encephalopathy    Discharge Instructions  Discharge Instructions     Diet - low sodium heart healthy   Complete by: As directed    Increase activity slowly   Complete by: As directed       Allergies as of 03/25/2022       Reactions    Metformin And Related    GI symptoms   Statins Other (See Comments)   Legs ache, he states Crestor cause shoulder pain, states Crestor caused rash on hands   Vicodin [hydrocodone-acetaminophen] Nausea Only        Medication List     TAKE these medications    aspirin 325 MG tablet Take 325 mg by mouth every evening.   atorvastatin 10 MG tablet Commonly known as: LIPITOR TAKE ONE TABLET BY MOUTH ON MONDAY AND FRIDAY What changed:  how much to take how to take this when to take this additional instructions   B-D ULTRAFINE III SHORT PEN 31G X 8 MM Misc Generic drug: Insulin Pen Needle USE PEN NEEDLES DAILY WITH LANTUS   betamethasone valerate ointment 0.1 % Commonly known as: VALISONE APPLY 1 APPLICATION TOPICALLY 2 (TWO) TIMES DAILY AS NEEDED. What changed: reasons to take this   ciprofloxacin 500 MG tablet Commonly known as: Cipro Take 1 tablet (500 mg total) by mouth 2 (two) times daily for 10 days.   ergocalciferol 1.25 MG (50000 UT) capsule Commonly known as: VITAMIN D2 Take 1 capsule by mouth once a week.   furosemide 20 MG tablet Commonly known as: Lasix Take 1 tablet (20 mg total) by mouth daily for 5 days.   Levemir FlexTouch 100 UNIT/ML FlexPen Generic drug: insulin detemir INJECT 54 UNITS AT BEDTIME - MAY TITRATE UP TO 70 UNITS INTO SKIN AT BEDTIME FOR DIABETES What changed: See the new instructions.   meloxicam 15 MG tablet Commonly known as: MOBIC Take 15 mg  by mouth daily.   metFORMIN 500 MG 24 hr tablet Commonly known as: GLUCOPHAGE-XR TAKE 1 TABLET BY MOUTH EVERY DAY WITH BREAKFAST What changed: See the new instructions.   multivitamin with minerals tablet Take 1 tablet by mouth daily.   nystatin powder Commonly known as: MYCOSTATIN/NYSTOP Apply topically 3 (three) times daily.   oxyCODONE-acetaminophen 10-325 MG tablet Commonly known as: PERCOCET Take one tablet by mouth every 6 hours prn pain What changed:  how much to take how to  take this when to take this reasons to take this additional instructions   silver sulfADIAZINE 1 % cream Commonly known as: SILVADENE Apply 1 application  topically daily.        Allergies  Allergen Reactions   Metformin And Related     GI symptoms   Statins Other (See Comments)    Legs ache, he states Crestor cause shoulder pain, states Crestor caused rash on hands   Vicodin [Hydrocodone-Acetaminophen] Nausea Only    Consultations:    Procedures/Studies: US SCROTUM W/DOPPLER  Result Date: 03/24/2022 CLINICAL DATA:  Left scrotal pain and swelling for the past 2 days. EXAM: SCROTAL ULTRASOUND DOPPLER ULTRASOUND OF THE TESTICLES TECHNIQUE: Complete ultrasound examination of the testicles, epididymis, and other scrotal structures was performed. Color and spectral Doppler ultrasound were also utilized to evaluate blood flow to the testicles. COMPARISON:  None Available. FINDINGS: Right testicle Measurements: 4.6 x 2.5 x 2.2 cm. No mass or microlithiasis visualized. Left testicle Measurements: 4.6 x 3.0 x 3.2 cm. Heterogeneous appearance. No mass or microlithiasis visualized. Asymmetrically increased vascularity compared to the right. Right epididymis: Normal in size and appearance. 6 mm epididymal head cyst. Left epididymis: Asymmetrically increased vascularity compared to the right. 9 mm epididymal head cyst. Hydrocele:  Small left greater than right hydroceles. Varicocele:  None visualized. Pulsed Doppler interrogation of both testes demonstrates normal low resistance arterial and venous waveforms bilaterally. IMPRESSION: 1. Findings most consistent with left epididymo-orchitis. Electronically Signed   By: Titus Dubin M.D.   On: 03/24/2022 09:56   DG Chest Port 1 View  Result Date: 03/23/2022 CLINICAL DATA:  ams EXAM: PORTABLE CHEST 1 VIEW COMPARISON:  Chest x-ray 09/18/2018 FINDINGS: The heart and mediastinal contours are unchanged. No focal consolidation. No pulmonary edema. No  pleural effusion. No pneumothorax. No acute osseous abnormality. IMPRESSION: No active disease. Electronically Signed   By: Iven Finn M.D.   On: 03/23/2022 20:53   MR Brain Wo Contrast  Result Date: 03/08/2022 CLINICAL DATA:  Provided history: Short-term memory loss. Memory loss; short-term memory loss. EXAM: MRI HEAD WITHOUT CONTRAST TECHNIQUE: Multiplanar, multiecho pulse sequences of the brain and surrounding structures were obtained without intravenous contrast. COMPARISON:  MRI brain and MRA head 09/18/2018. FINDINGS: Brain: Central predominant cerebral atrophy, most notably affecting the temporal and occipital lobes. Punctate acute infarcts within the high left parietal lobe and posterior left temporal lobe (3 total) (series 5, images 40, 21 and 18). Multiple chronic small-vessel infarcts within the bilateral cerebral hemispheric white matter, progressed in number from the prior brain MRI of 09/18/2018. Background advanced patchy and confluent T2 FLAIR hyperintense signal abnormality within the cerebral white matter, nonspecific but compatible with chronic small vessel ischemic disease. These findings have also significantly progressed from the prior MRI. Numerous chronic parenchymal microhemorrhages with a supratentorial and lobar predominance. As before, there is relative sparing of the deep gray nuclei, brainstem and cerebellum. Redemonstrated somewhat larger (but subcentimeter) chronic hemorrhage within the left parietal lobe. Small arachnoid cyst within the  anterior left middle cranial fossa. No definite cortical encephalomalacia is identified. No evidence of an intracranial mass. No extra-axial fluid collection. No midline shift. Vascular: Maintained flow voids within the proximal large arterial vessels. Skull and upper cervical spine: No focal suspicious marrow lesion. Incompletely assessed cervical spondylosis. Sinuses/Orbits: No mass or acute finding within the imaged orbits. No significant  paranasal sinus disease. Other: Trace fluid within left mastoid air cells. Impression #1 will be called to the ordering clinician or representative by the Radiologist Assistant, and communication documented in the PACS or [employer]. IMPRESSION: Punctate acute infarcts within the high left parietal and posterior left temporal lobes (3 total). Multiple chronic small-vessel infarcts within bilateral cerebral hemispheric white matter, progressed in number from the prior MRI of 09/18/2018. Background severe cerebral white matter chronic small vessel ischemic disease, also significantly progressed. Numerous chronic parenchymal microhemorrhages with a supratentorial and lobar predominance. As before, there is relative sparing of the deep gray nuclei, brainstem and cerebellum. The distribution is suggestive of cerebral amyloid angiopathy. Central predominant atrophy, most notably affecting the temporal and occipital lobes. Electronically Signed   By: Kellie Simmering D.O.   On: 03/08/2022 11:10      Subjective: He is feeling better.  Overall pain in the scrotal area is better.  No fevers.  Discharge Exam: Vitals:   03/24/22 2154 03/25/22 0230 03/25/22 0527 03/25/22 1243  BP: 126/67 124/65 128/75 122/64  Pulse: 71 75 77 72  Resp: '18 18 17 20  '$ Temp: 98.2 F (36.8 C) 98.3 F (36.8 C) (!) 97.5 F (36.4 C) 98.2 F (36.8 C)  TempSrc:   Oral Oral  SpO2: 100% 99% 99% 100%  Weight:      Height:        General: Pt is alert, awake, not in acute distress Cardiovascular: RRR, S1/S2 +, no rubs, no gallops Respiratory: CTA bilaterally, no wheezing, no rhonchi Abdominal: Soft, NT, ND, bowel sounds + GU: Scrotal erythema and swelling is better.  Area is less tender. Extremities: no edema, no cyanosis    The results of significant diagnostics from this hospitalization (including imaging, microbiology, ancillary and laboratory) are listed below for reference.     Microbiology: Recent Results (from the  past 240 hour(s))  Culture, blood (routine x 2)     Status: None (Preliminary result)   Collection Time: 03/23/22  8:46 PM   Specimen: Right Antecubital; Blood  Result Value Ref Range Status   Specimen Description RIGHT ANTECUBITAL  Final   Special Requests   Final    BOTTLES DRAWN AEROBIC AND ANAEROBIC Blood Culture adequate volume   Culture   Final    NO GROWTH 2 DAYS Performed at [facility], [address] [place], [place] [address]    Report Status PENDING  Incomplete  Culture, blood (routine x 2)     Status: None (Preliminary result)   Collection Time: 03/23/22  8:46 PM   Specimen: Left Antecubital; Blood  Result Value Ref Range Status   Specimen Description LEFT ANTECUBITAL  Final   Special Requests   Final    BOTTLES DRAWN AEROBIC AND ANAEROBIC Blood Culture adequate volume   Culture   Final    NO GROWTH 2 DAYS Performed at [facility], [address], [place], [place] [address]    Report Status PENDING  Incomplete  Urine Culture     Status: Abnormal (Preliminary result)   Collection Time: 03/23/22 10:06 PM   Specimen: Urine, Catheterized  Result Value Ref Range Status  Specimen Description   Final    URINE, CATHETERIZED Performed at [facility], [address] [place], [place] [address]    Special Requests   Final    NONE Performed at [facility], [address], [place], [place] [address]    Culture (A)  Final    >=100,000 COLONIES/mL ESCHERICHIA COLI SUSCEPTIBILITIES TO FOLLOW Performed at [facility], [place] [address] [place], [place] [address]    Report Status PENDING  Incomplete  MRSA Next Gen by PCR, Nasal     Status: None   Collection Time: 03/24/22  3:47 AM   Specimen: Nasal Mucosa; Nasal Swab  Result Value Ref Range Status   MRSA by PCR Next Gen NOT DETECTED NOT DETECTED Final    Comment: (NOTE) The GeneXpert MRSA Assay (FDA approved for NASAL specimens only), is one component of a comprehensive MRSA colonization surveillance program. It  is not intended to diagnose MRSA infection nor to guide or monitor treatment for MRSA infections. Test performance is not FDA approved in patients less than 50 years old. Performed at [facility], [address], [place], [place] [address]      Labs: BNP (last 3 results) No results for input(s): "BNP" in the last 8760 hours. Basic Metabolic Panel: Recent Labs  Lab 03/23/22 2046 03/24/22 0427  NA 135 136  K 3.4* 3.6  CL 102 103  CO2 25 27  GLUCOSE 184* 135*  BUN 21 19  CREATININE 1.00 0.95  CALCIUM 8.2* 7.9*  MG  --  1.9   Liver Function Tests: Recent Labs  Lab 03/24/22 0427  AST 25  ALT 24  ALKPHOS 59  BILITOT 1.2  PROT 5.8*  ALBUMIN 3.1*   No results for input(s): "LIPASE", "AMYLASE" in the last 168 hours. No results for input(s): "AMMONIA" in the last 168 hours. CBC: Recent Labs  Lab 03/23/22 2046 03/24/22 0427  WBC 10.7* 7.6  NEUTROABS 9.1* 5.7  HGB 11.7* 11.4*  HCT 35.7* 35.1*  MCV 84.0 85.8  PLT 202 187   Cardiac Enzymes: No results for input(s): "CKTOTAL", "CKMB", "CKMBINDEX", "TROPONINI" in the last 168 hours. BNP: Invalid input(s): "POCBNP" CBG: Recent Labs  Lab 03/24/22 1615 03/24/22 2155 03/25/22 0731 03/25/22 1112 03/25/22 1621  GLUCAP 98 80 108* 131* 112*   D-Dimer No results for input(s): "DDIMER" in the last 72 hours. Hgb A1c No results for input(s): "HGBA1C" in the last 72 hours. Lipid Profile No results for input(s): "CHOL", "HDL", "LDLCALC", "TRIG", "CHOLHDL", "LDLDIRECT" in the last 72 hours. Thyroid function studies No results for input(s): "TSH", "T4TOTAL", "T3FREE", "THYROIDAB" in the last 72 hours.  Invalid input(s): "FREET3" Anemia work up No results for input(s): "VITAMINB12", "FOLATE", "FERRITIN", "TIBC", "IRON", "RETICCTPCT" in the last 72 hours. Urinalysis    Component Value Date/Time   COLORURINE AMBER (A) 03/23/2022 2206   APPEARANCEUR HAZY (A) 03/23/2022 2206   LABSPEC 1.021 03/23/2022 2206   PHURINE 5.0  03/23/2022 2206   GLUCOSEU NEGATIVE 03/23/2022 2206   HGBUR MODERATE (A) 03/23/2022 2206   BILIRUBINUR NEGATIVE 03/23/2022 2206   KETONESUR 20 (A) 03/23/2022 2206   PROTEINUR 100 (A) 03/23/2022 2206   UROBILINOGEN 1.0 03/25/2008 1430   NITRITE POSITIVE (A) 03/23/2022 2206   LEUKOCYTESUR MODERATE (A) 03/23/2022 2206   Sepsis Labs Recent Labs  Lab 03/23/22 2046 03/24/22 0427  WBC 10.7* 7.6   Microbiology Recent Results (from the past 240 hour(s))  Culture, blood (routine x 2)     Status: None (Preliminary result)  Collection Time: 03/23/22  8:46 PM   Specimen: Right Antecubital; Blood  Result Value Ref Range Status   Specimen Description RIGHT ANTECUBITAL  Final   Special Requests   Final    BOTTLES DRAWN AEROBIC AND ANAEROBIC Blood Culture adequate volume   Culture   Final    NO GROWTH 2 DAYS Performed at [facility], [address], [place], [place] [address]    Report Status PENDING  Incomplete  Culture, blood (routine x 2)     Status: None (Preliminary result)   Collection Time: 03/23/22  8:46 PM   Specimen: Left Antecubital; Blood  Result Value Ref Range Status   Specimen Description LEFT ANTECUBITAL  Final   Special Requests   Final    BOTTLES DRAWN AEROBIC AND ANAEROBIC Blood Culture adequate volume   Culture   Final    NO GROWTH 2 DAYS Performed at [facility], [address], [place], [place] [address]    Report Status PENDING  Incomplete  Urine Culture     Status: Abnormal (Preliminary result)   Collection Time: 03/23/22 10:06 PM   Specimen: Urine, Catheterized  Result Value Ref Range Status   Specimen Description   Final    URINE, CATHETERIZED Performed at [facility], [address], [place], [place] [address]    Special Requests   Final    NONE Performed at [facility], [address] [place], [place] [address]    Culture (A)  Final    >=100,000 COLONIES/mL ESCHERICHIA COLI SUSCEPTIBILITIES TO FOLLOW Performed at [facility], [place]  [address], [place], [place] [address]    Report Status PENDING  Incomplete  MRSA Next Gen by PCR, Nasal     Status: None   Collection Time: 03/24/22  3:47 AM   Specimen: Nasal Mucosa; Nasal Swab  Result Value Ref Range Status   MRSA by PCR Next Gen NOT DETECTED NOT DETECTED Final    Comment: (NOTE) The GeneXpert MRSA Assay (FDA approved for NASAL specimens only), is one component of a comprehensive MRSA colonization surveillance program. It is not intended to diagnose MRSA infection nor to guide or monitor treatment for MRSA infections. Test performance is not FDA approved in patients less than 31 years old. Performed at [facility], [address] [place], [place] [address]      Time coordinating discharge: 65mns  SIGNED:   JKathie Dike MD  [facility] 03/25/2022, 7:05 PM   If 7PM-7AM, please contact night-coverage [email or web address]

## 2022-03-25 NOTE — Progress Notes (Signed)
Ng Discharge Note  Admit Date:  03/23/2022 Discharge date: 03/25/2022   [name] to be D/C'd Home per MD order.  AVS completed. Patient/caregiver able to verbalize understanding.  Discharge Medication: Allergies as of 03/25/2022       Reactions   Metformin And Related    GI symptoms   Statins Other (See Comments)   Legs ache, he states Crestor cause shoulder pain, states Crestor caused rash on hands   Vicodin [hydrocodone-acetaminophen] Nausea Only        Medication List     TAKE these medications    aspirin 325 MG tablet Take 325 mg by mouth every evening.   atorvastatin 10 MG tablet Commonly known as: LIPITOR TAKE ONE TABLET BY MOUTH ON MONDAY AND FRIDAY What changed:  how much to take how to take this when to take this additional instructions   B-D ULTRAFINE III SHORT PEN 31G X 8 MM Misc Generic drug: Insulin Pen Needle USE PEN NEEDLES DAILY WITH LANTUS   betamethasone valerate ointment 0.1 % Commonly known as: VALISONE APPLY 1 APPLICATION TOPICALLY 2 (TWO) TIMES DAILY AS NEEDED. What changed: reasons to take this   ciprofloxacin 500 MG tablet Commonly known as: Cipro Take 1 tablet (500 mg total) by mouth 2 (two) times daily for 10 days.   ergocalciferol 1.25 MG (50000 UT) capsule Commonly known as: VITAMIN D2 Take 1 capsule by mouth once a week.   furosemide 20 MG tablet Commonly known as: Lasix Take 1 tablet (20 mg total) by mouth daily for 5 days.   Levemir FlexTouch 100 UNIT/ML FlexPen Generic drug: insulin detemir INJECT 54 UNITS AT BEDTIME - MAY TITRATE UP TO 70 UNITS INTO SKIN AT BEDTIME FOR DIABETES What changed: See the new instructions.   meloxicam 15 MG tablet Commonly known as: MOBIC Take 15 mg by mouth daily.   metFORMIN 500 MG 24 hr tablet Commonly known as: GLUCOPHAGE-XR TAKE 1 TABLET BY MOUTH EVERY DAY WITH BREAKFAST What changed: See the new instructions.   multivitamin with minerals tablet Take 1 tablet by mouth daily.    nystatin powder Commonly known as: MYCOSTATIN/NYSTOP Apply topically 3 (three) times daily.   oxyCODONE-acetaminophen 10-325 MG tablet Commonly known as: PERCOCET Take one tablet by mouth every 6 hours prn pain What changed:  how much to take how to take this when to take this reasons to take this additional instructions   silver sulfADIAZINE 1 % cream Commonly known as: SILVADENE Apply 1 application  topically daily.        Discharge Assessment: Vitals:   03/25/22 0527 03/25/22 1243  BP: 128/75 122/64  Pulse: 77 72  Resp: 17 20  Temp: (!) 97.5 F (36.4 C) 98.2 F (36.8 C)  SpO2: 99% 100%   Skin clean, dry and intact without evidence of skin break down, no evidence of skin tears noted. IV catheter discontinued intact. Site without signs and symptoms of complications - no redness or edema noted at insertion site, patient denies c/o pain - only slight tenderness at site.  Dressing with slight pressure applied.  D/c Instructions-Education: Discharge instructions given to patient/family with verbalized understanding. D/c education completed with patient/family including follow up instructions, medication list, d/c activities limitations if indicated, with other d/c instructions as indicated by MD - patient able to verbalize understanding, all questions fully answered. Patient instructed to return to ED, call 911, or call MD for any changes in condition.  Patient escorted via [place], and D/C home via private auto.  Tsosie Billing, LPN 12/15/5398 8:67 PM

## 2022-03-26 LAB — URINE CULTURE: Culture: >=100000 — AB

## 2022-03-27 ENCOUNTER — Other Ambulatory Visit: Payer: Self-pay | Admitting: Family Medicine

## 2022-03-27 DIAGNOSIS — E1169 Type 2 diabetes mellitus with other specified complication: Secondary | ICD-10-CM

## 2022-03-28 ENCOUNTER — Telehealth: Payer: Self-pay

## 2022-03-28 LAB — CULTURE, BLOOD (ROUTINE X 2)
Culture: NO GROWTH
Culture: NO GROWTH
Special Requests: ADEQUATE
Special Requests: ADEQUATE

## 2022-03-28 NOTE — Telephone Encounter (Signed)
Transition Care Management Unsuccessful Follow-up Telephone Call  Date of discharge and from where:  03/25/22 [facility]  Attempts:  1st Attempt  Reason for unsuccessful TCM follow-up call:  Left voice message

## 2022-03-28 NOTE — Telephone Encounter (Signed)
Transition Care Management Unsuccessful Follow-up Telephone Call  Date of discharge and from where:  03/25/2022 [facility]  Attempts:  2nd Attempt  Reason for unsuccessful TCM follow-up call:  Left voice message. LM ON PT'S WIFE, BRENDA Krass'S NUMBER.

## 2022-03-29 NOTE — Telephone Encounter (Signed)
Left message to return call 

## 2022-03-29 NOTE — Telephone Encounter (Signed)
Patient had serious urinary tract infection along with sepsis Certainly understand patient telling [name] that he does not want to do follow-up right away I highly recommend a follow-up office visit no later than the first week of July to recheck his urine

## 2022-03-29 NOTE — Telephone Encounter (Signed)
Transition Care Management Follow-up Telephone Call Date of discharge and from where: 03/25/2022 [place] How have you been since you were released from the hospital? Pt states he is doing better. Any questions or concerns? No  Items Reviewed: Did the pt receive and understand the discharge instructions provided? Yes  Medications obtained and verified? Yes  Other? No  Any new allergies since your discharge? No  Dietary orders reviewed? Yes Do you have support at home? Yes   Home Care and Equipment/Supplies: Were home health services ordered? not applicable If so, what is the name of the agency? N/A  Has the agency set up a time to come to the patient's home? not applicable Were any new equipment or medical supplies ordered?  No What is the name of the medical supply agency? N/A Were you able to get the supplies/equipment? not applicable Do you have any questions related to the use of the equipment or supplies? No  Functional Questionnaire: (I = Independent and D = Dependent) ADLs: I  Bathing/Dressing- I  Meal Prep- I  Eating- I  Maintaining continence- I  Transferring/Ambulation- I  Managing Meds- I  Follow up appointments reviewed:  PCP Hospital f/u appt confirmed? No  Pt states he wants to wait, "awhile" before scheduling any appointments.  [facility] f/u appt confirmed?  N/A   Are transportation arrangements needed? No  If their condition worsens, is the pt aware to call PCP or go to the Emergency Dept.? Yes Was the patient provided with contact information for the PCP's office or ED? Yes Was to pt encouraged to call back with questions or concerns? Yes

## 2022-03-31 NOTE — Telephone Encounter (Signed)
Left message to return call 

## 2022-04-14 NOTE — Telephone Encounter (Signed)
Left message to return call 

## 2022-04-14 NOTE — Telephone Encounter (Signed)
Pt returned call and has set up appt for 04/25/22

## 2022-04-20 ENCOUNTER — Encounter: Payer: Self-pay | Admitting: Internal Medicine

## 2022-04-20 DIAGNOSIS — B351 Tinea unguium: Secondary | ICD-10-CM | POA: Diagnosis not present

## 2022-04-20 DIAGNOSIS — E1142 Type 2 diabetes mellitus with diabetic polyneuropathy: Secondary | ICD-10-CM | POA: Diagnosis not present

## 2022-04-20 DIAGNOSIS — L84 Corns and callosities: Secondary | ICD-10-CM | POA: Diagnosis not present

## 2022-04-20 DIAGNOSIS — I739 Peripheral vascular disease, unspecified: Secondary | ICD-10-CM | POA: Diagnosis not present

## 2022-04-25 ENCOUNTER — Ambulatory Visit (INDEPENDENT_AMBULATORY_CARE_PROVIDER_SITE_OTHER): Payer: Medicare HMO | Admitting: Family Medicine

## 2022-04-25 ENCOUNTER — Other Ambulatory Visit: Payer: Self-pay

## 2022-04-25 VITALS — BP 116/65 | HR 89 | Temp 98.4°F | Ht 75.0 in | Wt 237.8 lb

## 2022-04-25 DIAGNOSIS — I10 Essential (primary) hypertension: Secondary | ICD-10-CM

## 2022-04-25 DIAGNOSIS — Z8673 Personal history of transient ischemic attack (TIA), and cerebral infarction without residual deficits: Secondary | ICD-10-CM | POA: Diagnosis not present

## 2022-04-25 DIAGNOSIS — R413 Other amnesia: Secondary | ICD-10-CM | POA: Diagnosis not present

## 2022-04-25 DIAGNOSIS — R600 Localized edema: Secondary | ICD-10-CM

## 2022-04-25 DIAGNOSIS — D509 Iron deficiency anemia, unspecified: Secondary | ICD-10-CM

## 2022-04-25 DIAGNOSIS — I779 Disorder of arteries and arterioles, unspecified: Secondary | ICD-10-CM

## 2022-04-25 DIAGNOSIS — R634 Abnormal weight loss: Secondary | ICD-10-CM | POA: Diagnosis not present

## 2022-04-25 DIAGNOSIS — I639 Cerebral infarction, unspecified: Secondary | ICD-10-CM

## 2022-04-25 DIAGNOSIS — M6281 Muscle weakness (generalized): Secondary | ICD-10-CM

## 2022-04-25 DIAGNOSIS — R1084 Generalized abdominal pain: Secondary | ICD-10-CM | POA: Diagnosis not present

## 2022-04-25 MED ORDER — POTASSIUM CHLORIDE CRYS ER 10 MEQ PO TBCR: EXTENDED_RELEASE_TABLET | ORAL | 1 refills | Status: DC

## 2022-04-25 MED ORDER — SILVER SULFADIAZINE 1 % EX CREA: 1.0000 | TOPICAL_CREAM | Freq: Every day | CUTANEOUS | 6 refills | Status: DC

## 2022-04-25 MED ORDER — FUROSEMIDE 20 MG PO TABS: ORAL_TABLET | ORAL | 0 refills | Status: DC

## 2022-04-25 NOTE — Progress Notes (Signed)
Subjective:    Patient ID: [name], male    DOB: [birth year], 66 y.o.   MRN: [id number]  HPI  Pt was admitted to hospital with UTI, sepsis. He was discharged on 03/25/22.   Hospital records reviewed Discussion held with patient regarding avoiding future infections Concerned about his progressive weight loss over the past 6 months He claims that he is eating normal patterns Losing muscle mass and weight Denies fevers sweats coughs denies rectal bleeding hematuria Patient finds himself depressed to a degree about his current health issues but does not want to be on any antidepressants Patient with some intermittent abdominal pain as well as low appetite and losing weight over the past 6 months Patient needs refill on Silvadene cream and Lasix Review of Systems     Objective:   Physical Exam  General-in no acute distress Eyes-no discharge Lungs-respiratory rate normal, CTA CV-no murmurs,RRR Extremities skin warm dry mild edema Neuro grossly normal Behavior normal, alert       Assessment & Plan:  1. Iron deficiency anemia, unspecified iron deficiency anemia type Patient has been losing weight something is going on.  I am concerned about the possibility of underlying process needs further lab work may need CAT scans as well - Cortisol - CBC with Differential - Iron, TIBC and Ferritin Panel - Comprehensive metabolic panel  2. Carotid artery disease, unspecified laterality, unspecified type ([place]) History of strokes carotid artery disease we will repeat the carotids, also set up with neurology for evaluation regarding his strokes - Cortisol - CBC with Differential - Iron, TIBC and Ferritin Panel - Comprehensive metabolic panel - DG Chest 2 View; Future - US Carotid Bilateral - Ambulatory referral to Cardiology  3. Leg edema Check lab work more than likely related to low protein.  But at the same time he is losing muscle mass await lab work - Cortisol - CBC with  Differential - Iron, TIBC and Ferritin Panel - Comprehensive metabolic panel  4. Weight loss Losing muscle mass losing weight concerning for possibility of underlying process, do lab work first, may need it may need CAT scans of chest and abdomen  - Cortisol - CBC with Differential - Iron, TIBC and Ferritin Panel - Comprehensive metabolic panel  5. History of stroke I am concerned that he has some underlying issues going on I believe he would benefit from seeing neurology as well as cardiology.  I believe he would benefit from having a loop recorder to make sure he does not have atrial fibrillation in and out.  Also referral to neurology to make sure there is not other aspects that need to be covered to minimize his risk of repetitive strokes because this is having effect on his cognitive skills - Cortisol - CBC with Differential - Iron, TIBC and Ferritin Panel - Comprehensive metabolic panel - Ambulatory referral to Neurology  6. Muscle weakness (generalized) Lab work await results - Cortisol - CBC with Differential - Iron, TIBC and Ferritin Panel - Comprehensive metabolic panel - Ambulatory referral to Neurology  7. Short-term memory loss This is secondary to the strokes.  Very important for the patient to prevent more strokes Some mild depression but does not want to be on any medication Follow-up again in 4 to 6 weeks  Cardiology evaluation to make sure he does not have underlying atrial fibrillation paroxysmal.  Given some intermittent lower abdominal pain and mid abdominal pain along with weight loss over the past 6 months that is unintentional greater  than 25 pounds I believe it is wise to go ahead with a CT scan abdomen pelvis with contrast to rule out the possibility of tumor or growth.  We will check lab work first

## 2022-04-26 LAB — COMPREHENSIVE METABOLIC PANEL
AG Ratio: 1.7 (calc) (ref 1.0–2.5)
ALT: 10 U/L (ref 9–46)
AST: 14 U/L (ref 10–35)
Albumin: 4.3 g/dL (ref 3.6–5.1)
Alkaline phosphatase (APISO): 68 U/L (ref 35–144)
BUN: 17 mg/dL (ref 7–25)
CO2: 27 mmol/L (ref 20–32)
Calcium: 9.4 mg/dL (ref 8.6–10.3)
Chloride: 106 mmol/L (ref 98–110)
Creat: 1.03 mg/dL (ref 0.70–1.35)
Globulin: 2.6 g/dL (calc) (ref 1.9–3.7)
Glucose, Bld: 102 mg/dL — ABNORMAL HIGH (ref 65–99)
Potassium: 4.8 mmol/L (ref 3.5–5.3)
Sodium: 140 mmol/L (ref 135–146)
Total Bilirubin: 0.7 mg/dL (ref 0.2–1.2)
Total Protein: 6.9 g/dL (ref 6.1–8.1)

## 2022-04-26 LAB — IRON,TIBC AND FERRITIN PANEL
%SAT: 21 % (calc) (ref 20–48)
Ferritin: 133 ng/mL (ref 24–380)
Iron: 61 ug/dL (ref 50–180)
TIBC: 285 mcg/dL (calc) (ref 250–425)

## 2022-04-26 LAB — CBC WITH DIFFERENTIAL/PLATELET
Absolute Monocytes: 634 cells/uL (ref 200–950)
Basophils Absolute: 59 cells/uL (ref 0–200)
Basophils Relative: 0.9 %
Eosinophils Absolute: 158 cells/uL (ref 15–500)
Eosinophils Relative: 2.4 %
HCT: 39.1 % (ref 38.5–50.0)
Hemoglobin: 12.8 g/dL — ABNORMAL LOW (ref 13.2–17.1)
Lymphs Abs: 1082 cells/uL (ref 850–3900)
MCH: 28.3 pg (ref 27.0–33.0)
MCHC: 32.7 g/dL (ref 32.0–36.0)
MCV: 86.3 fL (ref 80.0–100.0)
MPV: 10.2 fL (ref 7.5–12.5)
Monocytes Relative: 9.6 %
Neutro Abs: 4666 cells/uL (ref 1500–7800)
Neutrophils Relative %: 70.7 %
Platelets: 250 10*3/uL (ref 140–400)
RBC: 4.53 10*6/uL (ref 4.20–5.80)
RDW: 14.6 % (ref 11.0–15.0)
Total Lymphocyte: 16.4 %
WBC: 6.6 10*3/uL (ref 3.8–10.8)

## 2022-04-26 LAB — CORTISOL: Cortisol, Plasma: 14.7 ug/dL

## 2022-04-28 ENCOUNTER — Other Ambulatory Visit: Payer: Self-pay | Admitting: Family Medicine

## 2022-04-28 DIAGNOSIS — R109 Unspecified abdominal pain: Secondary | ICD-10-CM

## 2022-04-28 DIAGNOSIS — R634 Abnormal weight loss: Secondary | ICD-10-CM

## 2022-05-17 ENCOUNTER — Ambulatory Visit (HOSPITAL_COMMUNITY)
Admission: RE | Admit: 2022-05-17 | Discharge: 2022-05-17 | Disposition: A | Discharge status: Home / Self Care | Payer: Medicare HMO | Source: Ambulatory Visit | Attending: Family Medicine | Admitting: Family Medicine

## 2022-05-17 ENCOUNTER — Encounter: Payer: Self-pay | Admitting: Family Medicine

## 2022-05-17 DIAGNOSIS — S22080A Wedge compression fracture of T11-T12 vertebra, initial encounter for closed fracture: Secondary | ICD-10-CM | POA: Diagnosis not present

## 2022-05-17 DIAGNOSIS — R109 Unspecified abdominal pain: Secondary | ICD-10-CM | POA: Diagnosis present

## 2022-05-17 DIAGNOSIS — R590 Localized enlarged lymph nodes: Secondary | ICD-10-CM | POA: Diagnosis not present

## 2022-05-17 DIAGNOSIS — I6523 Occlusion and stenosis of bilateral carotid arteries: Secondary | ICD-10-CM | POA: Diagnosis not present

## 2022-05-17 DIAGNOSIS — R634 Abnormal weight loss: Secondary | ICD-10-CM | POA: Diagnosis not present

## 2022-05-17 DIAGNOSIS — M4317 Spondylolisthesis, lumbosacral region: Secondary | ICD-10-CM | POA: Diagnosis not present

## 2022-05-17 DIAGNOSIS — I779 Disorder of arteries and arterioles, unspecified: Secondary | ICD-10-CM | POA: Insufficient documentation

## 2022-05-19 ENCOUNTER — Telehealth: Payer: Self-pay

## 2022-05-19 NOTE — Telephone Encounter (Signed)
See result note.  

## 2022-05-19 NOTE — Telephone Encounter (Signed)
Pt returning [name]

## 2022-05-23 ENCOUNTER — Ambulatory Visit (INDEPENDENT_AMBULATORY_CARE_PROVIDER_SITE_OTHER): Payer: Medicare HMO | Admitting: Family Medicine

## 2022-05-23 VITALS — BP 122/66 | HR 92 | Temp 98.4°F | Ht 75.0 in | Wt 242.0 lb

## 2022-05-23 DIAGNOSIS — Z1211 Encounter for screening for malignant neoplasm of colon: Secondary | ICD-10-CM | POA: Diagnosis not present

## 2022-05-23 DIAGNOSIS — Z79891 Long term (current) use of opiate analgesic: Secondary | ICD-10-CM

## 2022-05-23 DIAGNOSIS — M1711 Unilateral primary osteoarthritis, right knee: Secondary | ICD-10-CM | POA: Diagnosis not present

## 2022-05-23 DIAGNOSIS — E1142 Type 2 diabetes mellitus with diabetic polyneuropathy: Secondary | ICD-10-CM | POA: Diagnosis not present

## 2022-05-23 MED ORDER — POTASSIUM CHLORIDE CRYS ER 10 MEQ PO TBCR
EXTENDED_RELEASE_TABLET | ORAL | 5 refills | Status: DC
Start: 2022-05-23 — End: 2022-10-24

## 2022-05-23 MED ORDER — OXYCODONE-ACETAMINOPHEN 10-325 MG PO TABS: ORAL_TABLET | ORAL | 0 refills | Status: DC

## 2022-05-23 MED ORDER — SILVER SULFADIAZINE 1 % EX CREA: 1.0000 | TOPICAL_CREAM | Freq: Every day | CUTANEOUS | 6 refills | Status: DC

## 2022-05-23 MED ORDER — FUROSEMIDE 20 MG PO TABS: ORAL_TABLET | ORAL | 6 refills | Status: DC

## 2022-05-23 NOTE — Progress Notes (Signed)
Subjective:    Patient ID: [name], male    DOB: 22-Jan-1956, 66 y.o.   MRN: [id number]  HPI  This patient was seen today for chronic pain  The medication list was reviewed and updated.  Location of Pain for which the patient has been treated with regarding narcotics: all over , especially R leg , swelling   Onset of this pain: chronic   -Compliance with medication: daily  - Number patient states they take daily: 4   -when was the last dose patient took? This am 9 am   The patient was advised the importance of maintaining medication and not using illegal substances with these.  Here for refills and follow up  The patient was educated that we can provide 3 monthly scripts for their medication, it is their responsibility to follow the instructions.  Side effects or complications from medications: no  Patient is aware that pain medications are meant to minimize the severity of the pain to allow their pain levels to improve to allow for better function. They are aware of that pain medications cannot totally remove their pain.  Due for UDT ( at least once per year) : 03/2021  Scale of 1 to 10 ( 1 is least 10 is most) Your pain level without the medicine: 10 Your pain level with medication 7  Scale 1 to 10 ( 1-helps very little, 10 helps very well) How well does your pain medication reduce your pain so you can function better through out the day? 7  Quality of the pain: unbearable  Persistence of the pain: ongoing  Modifying factors: none       Review of Systems     Objective:   Physical Exam  Lungs are clear hearts regular pulse normal BP good extremities trace edema  Patient denies sweats or fevers    Assessment & Plan:  Very important to get diabetic retinopathy checked Pedal edema resume diuretic Diabetes good control currently Continue current measures Pain management along with lab work in 3 months  1. Primary osteoarthritis of right knee Pain  management - oxyCODONE-acetaminophen (PERCOCET) 10-325 MG tablet; Take one tablet by mouth every 6 hours prn pain  Dispense: 120 tablet; Refill: 0  2. Diabetic peripheral neuropathy (HCC) Pain management - oxyCODONE-acetaminophen (PERCOCET) 10-325 MG tablet; Take one tablet by mouth every 6 hours prn pain  Dispense: 120 tablet; Refill: 0  3. Encounter for long-term opiate analgesic use Pain management The patient was seen in followup for chronic pain. A review over at their current pain status was discussed. Drug registry was checked. Prescriptions were given.  Regular follow-up recommended. Discussion was held regarding the importance of compliance with medication as well as pain medication contract.  Patient was informed that medication may cause drowsiness and should not be combined  with other medications/alcohol or street drugs. If the patient feels medication is causing altered alertness then do not drive or operate dangerous equipment.  Should be noted that the patient appears to be meeting appropriate use of opioids and response.  Evidenced by improved function and decent pain control without significant side effects and no evidence of overt aberrancy issues.  Upon discussion with the patient today they understand that opioid therapy is optional and they feel that the pain has been refractory to reasonable conservative measures and is significant and affecting quality of life enough to warrant ongoing therapy and wishes to continue opioids.  Refills were provided.  - oxyCODONE-acetaminophen (PERCOCET) 10-325 MG tablet;  Take one tablet by mouth every 6 hours prn pain  Dispense: 120 tablet; Refill: 0  4. Screening for colon cancer Screening - IFOBT POC (occult bld, rslt in office); Future Stool testing is done mainly because his hemoglobin has dropped His weight is stable currently he will do his best to continue eating well and he will follow-up in 3 months recheck weight at that  time

## 2022-05-25 DIAGNOSIS — E1142 Type 2 diabetes mellitus with diabetic polyneuropathy: Secondary | ICD-10-CM | POA: Diagnosis not present

## 2022-05-25 DIAGNOSIS — I739 Peripheral vascular disease, unspecified: Secondary | ICD-10-CM | POA: Diagnosis not present

## 2022-05-25 LAB — HM DIABETES EYE EXAM

## 2022-06-09 ENCOUNTER — Ambulatory Visit: Payer: Medicare HMO | Admitting: Family Medicine

## 2022-06-27 ENCOUNTER — Telehealth: Payer: Self-pay

## 2022-06-27 NOTE — Telephone Encounter (Signed)
Caller name:Burdette Margarita Grizzle   On DPR? :Yes Call back number:[phone]  Provider they see: Wolfgang Phoenix  Reason for call: Hassan Rowan is calling need advice regarding Simona Huh behavior getting out of hand that she can not handle him

## 2022-06-27 NOTE — Telephone Encounter (Signed)
Nurses Please talk with patient Please see what type of behaviors are going on May need a urgent follow-up to discuss situation as well as options

## 2022-06-27 NOTE — Telephone Encounter (Signed)
3:30 PM Wednesday with me family is aware thank you very much Please put him on the schedule

## 2022-06-27 NOTE — Telephone Encounter (Signed)
Wife states the patient is staying agitated and jumping all over the kids and jumping all over her all the time and constantly fussing and she doesn't know what to do anymore- wife states she has been up since 4am with him and is tearful on phone. Wife states to call her cell at [phone] and do not call house with recomendations

## 2022-06-29 ENCOUNTER — Ambulatory Visit (INDEPENDENT_AMBULATORY_CARE_PROVIDER_SITE_OTHER): Payer: Medicare HMO | Admitting: Family Medicine

## 2022-06-29 ENCOUNTER — Encounter: Payer: Self-pay | Admitting: Family Medicine

## 2022-06-29 ENCOUNTER — Ambulatory Visit: Payer: Medicare HMO | Admitting: Family Medicine

## 2022-06-29 VITALS — BP 128/75 | Wt 239.6 lb

## 2022-06-29 DIAGNOSIS — Z8679 Personal history of other diseases of the circulatory system: Secondary | ICD-10-CM

## 2022-06-29 DIAGNOSIS — F411 Generalized anxiety disorder: Secondary | ICD-10-CM | POA: Diagnosis not present

## 2022-06-29 DIAGNOSIS — R413 Other amnesia: Secondary | ICD-10-CM | POA: Diagnosis not present

## 2022-06-29 DIAGNOSIS — F09 Unspecified mental disorder due to known physiological condition: Secondary | ICD-10-CM

## 2022-06-29 DIAGNOSIS — R69 Illness, unspecified: Secondary | ICD-10-CM | POA: Diagnosis not present

## 2022-06-29 MED ORDER — SERTRALINE HCL 50 MG PO TABS: 50.0000 mg | ORAL_TABLET | Freq: Every day | ORAL | 3 refills | Status: DC

## 2022-06-29 NOTE — Progress Notes (Signed)
   Subjective:    Patient ID: [name], male    DOB: [birth year], 66 y.o.   MRN: [id number]  HPI Pt arrives due to callus on right foot that has worsened. Pt states the callus has been there for about 2 years.  Very challenging situation Patient having a difficult time with his lower legs and having a callus on the foot feels like there is a stone in his shoe  Pt wife [name] states pt becomes agitated very easily; sometimes he wants to argue sometimes not.  He gets upset and agitated easily has difficult time thinking things through.  Often needs his wife to help to make phone call or to utilize certain pieces of equipment but other things he seems to still do well.  She does all the driving.  Review of Systems     Objective:   Physical Exam  General-in no acute distress Eyes-no discharge Lungs-respiratory rate normal, CTA CV-no murmurs,RRR Extremities skin warm dry no edema Neuro grossly normal Behavior normal, alert   Greater than 30 minutes spent with the patient discussing multiple options    Assessment & Plan:  I did counsel the patient I did not feel it is wise for him to drive have his wife do the driving  He does have some cognitive dysfunction related to mini strokes.  He would benefit from significant intervention of risk factors to lessen his risk of stroke.  I believe he is also suffering to some degree with anxiety and stress related to the stroke.  I do not feel he is having psychosis or hallucinations.  I would recommend trying sertraline low-dose with a follow-up in 3 to 4 weeks.  If he feels that the medication is causing him problems he may stop the medicine.  He agrees to try the medicine but at the same time he states he does not want to feel like a" zombie"  We did discuss how occupational therapy could be beneficial but patient currently is thinking about it and will let us know

## 2022-06-30 ENCOUNTER — Other Ambulatory Visit: Payer: Self-pay

## 2022-06-30 DIAGNOSIS — Z1211 Encounter for screening for malignant neoplasm of colon: Secondary | ICD-10-CM

## 2022-06-30 LAB — IFOBT (OCCULT BLOOD): IFOBT: NEGATIVE

## 2022-07-05 ENCOUNTER — Telehealth: Payer: Self-pay | Admitting: Family Medicine

## 2022-07-05 ENCOUNTER — Telehealth: Payer: Self-pay

## 2022-07-05 ENCOUNTER — Encounter: Payer: Self-pay | Admitting: Student

## 2022-07-05 ENCOUNTER — Ambulatory Visit: Payer: Medicare HMO | Attending: Student | Admitting: Student

## 2022-07-05 VITALS — BP 124/68 | HR 79 | Ht 74.0 in | Wt 241.0 lb

## 2022-07-05 DIAGNOSIS — E785 Hyperlipidemia, unspecified: Secondary | ICD-10-CM

## 2022-07-05 DIAGNOSIS — I2510 Atherosclerotic heart disease of native coronary artery without angina pectoris: Secondary | ICD-10-CM

## 2022-07-05 DIAGNOSIS — I10 Essential (primary) hypertension: Secondary | ICD-10-CM | POA: Diagnosis not present

## 2022-07-05 DIAGNOSIS — I5020 Unspecified systolic (congestive) heart failure: Secondary | ICD-10-CM | POA: Diagnosis not present

## 2022-07-05 MED ORDER — METOPROLOL SUCCINATE ER 25 MG PO TB24: 25.0000 mg | ORAL_TABLET | Freq: Every day | ORAL | 3 refills | Status: DC

## 2022-07-05 NOTE — Telephone Encounter (Signed)
Stool test is negative for blood, please have the patient bring all of his medications with him to the next visit which is the 29th at 1:10 thank you

## 2022-07-05 NOTE — Telephone Encounter (Signed)
I will send her a message thanks

## 2022-07-05 NOTE — Patient Instructions (Signed)
Medication Instructions:   Start Toprol-XL '25mg'$  daily.   Take Lasix (Furosemide) as needed for weight gain greater than 2 pounds overnight, 5 pounds in 1 week or worsening swelling.   *If you need a refill on your cardiac medications before your next appointment, please call your pharmacy*   Follow-Up: At [facility], you and your health needs are our priority.  As part of our continuing mission to provide you with exceptional heart care, we have created designated Provider Care Teams.  These Care Teams include your primary Cardiologist (physician) and Advanced Practice Providers (APPs -  Physician Assistants and Nurse Practitioners) who all work together to provide you with the care you need, when you need it.  We recommend signing up for the patient portal called "MyChart".  Sign up information is provided on this After Visit Summary.  MyChart is used to connect with patients for Virtual Visits (Telemedicine).  Patients are able to view lab/test results, encounter notes, upcoming appointments, etc.  Non-urgent messages can be sent to your provider as well.   To learn more about what you can do with MyChart, go to [email or web address].    Your next appointment:   2-3 months  The format for your next appointment:   In Person  Provider:   You may see Dorris Carnes, MD or one of the following Advanced Practice Providers on your designated Care Team:   Bernerd Pho, PA-C  Ermalinda Barrios, [place]     Important Information About Sugar

## 2022-07-05 NOTE — Telephone Encounter (Signed)
-  call received from [place] being seen by Stormy Fabian now [phone] call Juliann Pulse to verify if patient is taking lisinopril. Please advise

## 2022-07-05 NOTE — Telephone Encounter (Signed)
Patient would like results of stool sample.

## 2022-07-05 NOTE — Progress Notes (Signed)
Cardiology Office Note    Date:  07/05/2022   ID:  [name], DOB 08/15/1956, MRN [id number]  PCP:  Kathyrn Drown, MD  Cardiologist: Dorris Carnes, MD    Chief Complaint  Patient presents with   Follow-up    Overdue Visit    History of Present Illness:    [name] is a 66 y.o. male with past medical history of CAD (s/p BMS to RCA in 2008, no ischemia by NST in 03/2021), HFrEF (EF 30-35% in 2019 with similar results by repeat echo in 03/2021), PAD, HTN, HLD, Type 2 DM and history of CVA who presents to the office today for overdue follow-up.  He was last examined by Dr. Harrington Challenger in 03/2021 for preoperative cardiac clearance for knee surgery and reported his activity was limited secondary to arthritis but denied any anginal symptoms. A Lexiscan Myoview was obtained and showed evidence of prior infarct with no current ischemia but EF was calculated at 28%.  Follow-up echocardiogram showed his EF was similar to prior imaging at 30 to 35% and follow-up was recommended to optimize CHF medications but he has not been evaluated since.  In talking with the patient and his wife today, he reports his activity is overall limited secondary to knee pain.  Was previously being followed by orthopedics for possible knee replacement but says this was delayed secondary to uncontrolled blood sugar. He does report worsening memory issues since his prior CVA.  Thankfully, he denies any recent chest pain or dyspnea on exertion. No specific orthopnea or PND. He does experience intermittent lower extremity edema and has a prescription for PRN Lasix but does not take this routinely. Does add salt to his food routinely.    Past Medical History:  Diagnosis Date   CAD (coronary artery disease)    a. s/p BMS to RCA in 2008 b. no ischemia by NST in 03/2021   Cardiomyopathy [facility])    CHF (congestive heart failure) ([place])    a. EF 30-35% in 2019 with similar results by repeat echo in 03/2021   Depression    DJD  (degenerative joint disease)    DM type 2 with diabetic mixed hyperlipidemia ([place]) 12/11/2018   Essential hypertension    Gout    History of inferior wall myocardial infarction 2008   Hypercholesterolemia    Hyperlipidemia    Obesity    Oral candidiasis    Peripheral vascular disease ([place])    Stroke [facility])    December 2019   Type 2 diabetes mellitus [facility])     Past Surgical History:  Procedure Laterality Date   AMPUTATION Right 01/12/2015   Procedure: PARTIAL AMPUTATION 1ST RAY RIGHT FOOT (Amputation of right great toe and portion of the first metatarsal bone right foot);  Surgeon: Caprice Beaver, DPM;  Location: [facility] ORS;  Service: Podiatry;  Laterality: Right;   AMPUTATION Right 09/14/2016   Procedure: PARTIAL AMPUTATION RAY 2ND TOE RIGHT FOOT;  Surgeon: Caprice Beaver, DPM;  Location: [facility] ORS;  Service: Podiatry;  Laterality: Right;   COLONOSCOPY  08/2008   COLONOSCOPY WITH PROPOFOL N/A 06/02/2020   Procedure: COLONOSCOPY WITH PROPOFOL;  Surgeon: [name], DO;  Location: [facility] END[facility] SUITE;  Service: Endoscopy;  Laterality: N/A;  7:30am   ESOPHAGOGASTRODUODENOSCOPY  08/2008   LAMINOTOMY  2001, 2006     Right L4-5 interlaminar laminotomy with excision of herniated   disc with operating microscope.   NECK SURGERY     cervical disc   ORIF  WRIST FRACTURE Right    PERIPHERAL VASCULAR CATHETERIZATION N/A 02/27/2015   Procedure: Abdominal Aortogram;  Surgeon: Elam Dutch, MD;  Location: [place] CV LAB;  Service: Cardiovascular;  Laterality: N/A;   PERIPHERAL VASCULAR CATHETERIZATION Bilateral 02/27/2015   Procedure: Lower Extremity Angiography;  Surgeon: Elam Dutch, MD;  Location: [place] [facility];  Service: Cardiovascular;  Laterality: Bilateral;   POLYPECTOMY  06/02/2020   Procedure: POLYPECTOMY;  Surgeon: [name], DO;  Location: [facility];  Service: Endoscopy;;   Right ankle fracture open reduction internal fixation.  2003   WOUND DEBRIDEMENT Right  12/24/2014   Procedure: DEBRIDEMENT SOFT TISSUE AND BONE RIGHT FOOT;  Surgeon: Caprice Beaver, DPM;  Location: [facility] ORS;  Service: Podiatry;  Laterality: Right;    Current Medications: Outpatient Medications Prior to Visit  Medication Sig Dispense Refill   aspirin 325 MG tablet Take 325 mg by mouth every evening.      atorvastatin (LIPITOR) 10 MG tablet TAKE ONE TABLET BY MOUTH ON MONDAY AND FRIDAY (Patient taking differently: Take 10 mg by mouth 2 (two) times a week.) 24 tablet 1   betamethasone valerate ointment (VALISONE) 0.1 % APPLY 1 APPLICATION TOPICALLY 2 (TWO) TIMES DAILY AS NEEDED. (Patient taking differently: Apply 1 application  topically 2 (two) times daily as needed (Psoriasis).) 45 g 0   ergocalciferol (VITAMIN D2) 1.25 MG (50000 UT) capsule Take 1 capsule by mouth once a week.     furosemide (LASIX) 20 MG tablet Take 1 tablet by mouth every morning as needed 30 tablet 6   insulin detemir (LEVEMIR FLEXTOUCH) 100 UNIT/ML FlexPen INJECT 54 UNITS AT BEDTIME - MAY TITRATE UP TO 70 UNITS INTO SKIN AT BEDTIME FOR DIABETES (Patient taking differently: Inject 54 Units into the skin at bedtime. May titrate up to 70u) 15 mL 1   Insulin Pen Needle (B-D ULTRAFINE III SHORT PEN) 31G X 8 MM MISC USE PEN NEEDLES DAILY WITH LANTUS 100 each 5   metFORMIN (GLUCOPHAGE-XR) 500 MG 24 hr tablet TAKE 1 TABLET BY MOUTH EVERY DAY WITH BREAKFAST 90 tablet 0   Multiple Vitamins-Minerals (MULTIVITAMIN WITH MINERALS) tablet Take 1 tablet by mouth daily.     nystatin (MYCOSTATIN/NYSTOP) powder Apply topically 3 (three) times daily. 15 g 0   oxyCODONE-acetaminophen (PERCOCET) 10-325 MG tablet Take one tablet by mouth every 6 hours prn pain 120 tablet 0   oxyCODONE-acetaminophen (PERCOCET) 10-325 MG tablet 1 taken 4 times daily as needed for pain 120 tablet 0   oxyCODONE-acetaminophen (PERCOCET) 10-325 MG tablet 1 taken 4 times daily as needed for pain 120 tablet 0   potassium chloride (KLOR-CON M) 10 MEQ tablet  Take 1 tablet by mouth every morning. 30 tablet 5   sertraline (ZOLOFT) 50 MG tablet Take 1 tablet (50 mg total) by mouth daily. 30 tablet 3   silver sulfADIAZINE (SILVADENE) 1 % cream Apply 1 Application topically daily. 50 g 6   furosemide (LASIX) 20 MG tablet Take 1 tablet (20 mg total) by mouth daily for 5 days. 5 tablet 0   No facility-administered medications prior to visit.     Allergies:   Metformin and related, Statins, and Vicodin [hydrocodone-acetaminophen]   Social History   Socioeconomic History   Marital status: Married    Spouse name: Not on file   Number of children: Not on file   Years of education: Not on file   Highest education level: Not on file  Occupational History   Not on file  Tobacco Use   Smoking status: Former    Packs/day: 1.00    Years: 10.00    Total pack years: 10.00    Types: Cigarettes    Quit date: 12/23/1970    Years since quitting: 51.5   Smokeless tobacco: Never   Tobacco comments:    quit in 1979  Vaping Use   Vaping Use: Never used  Substance and Sexual Activity   Alcohol use: No    Alcohol/week: 0.0 standard drinks of alcohol   Drug use: Yes    Types: Marijuana   Sexual activity: Yes    Birth control/protection: None  Other Topics Concern   Not on file  Social History Narrative    He is married and has 3 kids.  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  He denies any tobacco or alcohol use.    Social Determinants of Health   Financial Resource Strain: Not on file  Food Insecurity: Not on file  Transportation Needs: Not on file  Physical Activity: Not on file  Stress: Not on file  Social Connections: Not on file     Family History:  The patient's family history includes Cancer in his sister; Colon cancer in his mother; Diabetes in his father, mother, and sister; Heart disease in his sister; Lung cancer in his father.   Review of Systems:    Please see the history of present illness.     All other systems  reviewed and are otherwise negative except as noted above.   Physical Exam:    VS:  BP 124/68   Pulse 79   Ht '6\' 2"'$  (1.88 m)   Wt 241 lb (109.3 kg)   SpO2 98%   BMI 30.94 kg/m    General: Well developed, well nourished,male appearing in no acute distress. Head: Normocephalic, atraumatic. Neck: No carotid bruits. JVD not elevated.  Lungs: Respirations regular and unlabored, without wheezes or rales.  Heart: Regular rate and rhythm. No S3 or S4.  No murmur, no rubs, or gallops appreciated. Abdomen: Appears non-distended. No obvious abdominal masses. Msk:  Strength and tone appear normal for age. No obvious joint deformities or effusions. Extremities: No clubbing or cyanosis. Trace lower extremity edema.  Distal pedal pulses are 2+ bilaterally. Neuro: Alert and oriented X 3. Moves all extremities spontaneously. No focal deficits noted. Psych:  Responds to questions appropriately with a normal affect. Skin: No rashes or lesions noted  Wt Readings from Last 3 Encounters:  07/05/22 241 lb (109.3 kg)  06/29/22 239 lb 9.6 oz (108.7 kg)  05/23/22 242 lb (109.8 kg)     Studies/Labs Reviewed:   EKG:  EKG is ordered today. The EKG ordered today demonstrates NSR, HR 79 with 1st degree AV block and known LBBB.   Recent Labs: 03/24/2022: Magnesium 1.9 04/25/2022: ALT 10; BUN 17; Creat 1.03; Hemoglobin 12.8; Platelets 250; Potassium 4.8; Sodium 140   Lipid Panel    Component Value Date/Time   CHOL 116 02/25/2022 1122   CHOL 131 06/15/2015 0900   TRIG 45 02/25/2022 1122   HDL 44 02/25/2022 1122   HDL 33 (L) 06/15/2015 0900   CHOLHDL 2.6 02/25/2022 1122   VLDL 21 09/19/2018 0501   LDLCALC 59 02/25/2022 1122    Additional studies/ records that were reviewed today include:   NST: 03/2021 Lexiscan stress is electrically nondiagnostic due to baseline changes Myoview scan shows a large defect in the inferior and inferolateral walls (base/mid/distal) consistent with  scan Cannot completely  exclude some soft tissue attenuation (diaphragm) No ischemia. LVEF calculated at 28% with hypokinesis worse in the inferior/inferolateral walls Would recomm echo to confirm LVEF and wall motion. This is a high risk study.  Echocardiogram: 03/2021 IMPRESSIONS     1. Mid and basal inferior wall akinesis septal and apica severe  hypokinesis EF similar to echo done 09/18/2018. Left ventricular ejection  fraction, by estimation, is 30 to 35%. The left ventricle has moderately  decreased function. The left ventricle  demonstrates regional wall motion abnormalities (see scoring  diagram/findings for description). The left ventricular internal cavity  size was moderately dilated. There is mild left ventricular hypertrophy.  Left ventricular diastolic parameters were  normal.   2. Right ventricular systolic function is normal. The right ventricular  size is normal.   3. Left atrial size was moderately dilated.   4. The mitral valve is normal in structure. Trivial mitral valve  regurgitation. No evidence of mitral stenosis.   5. The aortic valve is tricuspid. Aortic valve regurgitation is not  visualized. Mild to moderate aortic valve sclerosis/calcification is  present, without any evidence of aortic stenosis.   6. The inferior vena cava is normal in size with greater than 50%  respiratory variability, suggesting right atrial pressure of 3 mmHg.   Carotid Dopplers: 05/2022 IMPRESSION: Color duplex indicates minimal heterogeneous plaque, with no hemodynamically significant stenosis by duplex criteria in the extracranial cerebrovascular circulation.  Assessment:    1. Coronary artery disease involving native coronary artery of native heart without angina pectoris   2. HFrEF (heart failure with reduced ejection fraction) ([place])   3. Essential hypertension   4. Hyperlipidemia LDL goal <70      Plan:   In order of problems listed above:  1. CAD - He is s/p BMS to RCA in 2008 and recent  NST in 03/2021 showed no ischemia. His activity is limited secondary to knee pain but he denies any recent anginal symptoms. He remains on ASA 325 mg daily (dosing per Neurology) and Atorvastatin 10 mg twice weekly. Will add Toprol-XL as discussed below.   2. HFrEF - His EF was at 30-35% in 2019 with similar results by repeat echo in 03/2021. He was previously on Lisinopril but does not believe he is taking this and we will verify with his pharmacy. He does have a prescription for PRN Lasix but does not utilize this routinely due to frequent urination. Reviewed echocardiogram results with the patient and his wife and will add Toprol-XL 25 mg daily. He is very hesitant about the cost of medications as he reports they on a limited budget as they are raising their grandchildren. Pending HR/BP response to Toprol-XL, would favor starting Losartan over Lisinopril if he is not taking this. Can consider Entresto or an SGLT2 inhibitor but would need dose adjustment of his Insulin and to also be approved for patient assistance for either medication. Also recommended limiting his sodium intake.   3. HTN - BP is at 124/68 during today's visit. Will verify with his pharmacy his current medications with plans to add Toprol-XL as outlined above.  4. HLD - FLP in 02/2022 showed his LDL was at 59. He remains on Atorvastatin '10mg'$  twice weekly as he was previously intolerant to more frequent dosing and had myalgias and a rash with Crestor.    Medication Adjustments/Labs and Tests Ordered: Current medicines are reviewed at length with the patient today.  Concerns regarding medicines are outlined above.  Medication  changes, Labs and Tests ordered today are listed in the Patient Instructions below. Patient Instructions  Medication Instructions:   Start Toprol-XL '25mg'$  daily.   Take Lasix (Furosemide) as needed for weight gain greater than 2 pounds overnight, 5 pounds in 1 week or worsening swelling.   *If you need a  refill on your cardiac medications before your next appointment, please call your pharmacy*   Follow-Up: At [facility], you and your health needs are our priority.  As part of our continuing mission to provide you with exceptional heart care, we have created designated Provider Care Teams.  These Care Teams include your primary Cardiologist (physician) and Advanced Practice Providers (APPs -  Physician Assistants and Nurse Practitioners) who all work together to provide you with the care you need, when you need it.  We recommend signing up for the patient portal called "MyChart".  Sign up information is provided on this After Visit Summary.  MyChart is used to connect with patients for Virtual Visits (Telemedicine).  Patients are able to view lab/test results, encounter notes, upcoming appointments, etc.  Non-urgent messages can be sent to your provider as well.   To learn more about what you can do with MyChart, go to [email or web address].    Your next appointment:   2-3 months  The format for your next appointment:   In Person  Provider:   You may see Dorris Carnes, MD or one of the following Advanced Practice Providers on your designated Care Team:   Bernerd Pho, PA-C  Ermalinda Barrios, PA-C     Important Information About Sugar         Signed, Erma Heritage, Hershal Coria  07/05/2022 4:41 PM    [place]. [address] [place], [place] [address] Phone: [phone] Fax: [phone]

## 2022-07-06 NOTE — Telephone Encounter (Signed)
Patient informed of his test results and note per drs notes.

## 2022-07-08 ENCOUNTER — Encounter: Payer: Self-pay | Admitting: *Deleted

## 2022-07-12 DIAGNOSIS — L97522 Non-pressure chronic ulcer of other part of left foot with fat layer exposed: Secondary | ICD-10-CM | POA: Diagnosis not present

## 2022-07-12 DIAGNOSIS — L84 Corns and callosities: Secondary | ICD-10-CM | POA: Diagnosis not present

## 2022-07-12 DIAGNOSIS — I739 Peripheral vascular disease, unspecified: Secondary | ICD-10-CM | POA: Diagnosis not present

## 2022-07-12 DIAGNOSIS — E1142 Type 2 diabetes mellitus with diabetic polyneuropathy: Secondary | ICD-10-CM | POA: Diagnosis not present

## 2022-07-12 DIAGNOSIS — S90869A Insect bite (nonvenomous), unspecified foot, initial encounter: Secondary | ICD-10-CM | POA: Diagnosis not present

## 2022-07-12 DIAGNOSIS — B351 Tinea unguium: Secondary | ICD-10-CM | POA: Diagnosis not present

## 2022-07-15 ENCOUNTER — Encounter: Payer: Self-pay | Admitting: Family Medicine

## 2022-07-15 ENCOUNTER — Ambulatory Visit (INDEPENDENT_AMBULATORY_CARE_PROVIDER_SITE_OTHER): Payer: Medicare HMO | Admitting: Family Medicine

## 2022-07-15 VITALS — BP 122/80 | Wt 249.2 lb

## 2022-07-15 DIAGNOSIS — M1711 Unilateral primary osteoarthritis, right knee: Secondary | ICD-10-CM | POA: Diagnosis not present

## 2022-07-15 DIAGNOSIS — I5022 Chronic systolic (congestive) heart failure: Secondary | ICD-10-CM

## 2022-07-15 DIAGNOSIS — E1142 Type 2 diabetes mellitus with diabetic polyneuropathy: Secondary | ICD-10-CM | POA: Diagnosis not present

## 2022-07-15 DIAGNOSIS — Z79891 Long term (current) use of opiate analgesic: Secondary | ICD-10-CM | POA: Diagnosis not present

## 2022-07-15 DIAGNOSIS — Z23 Encounter for immunization: Secondary | ICD-10-CM

## 2022-07-15 DIAGNOSIS — Z8673 Personal history of transient ischemic attack (TIA), and cerebral infarction without residual deficits: Secondary | ICD-10-CM

## 2022-07-15 MED ORDER — OXYCODONE-ACETAMINOPHEN 10-325 MG PO TABS: ORAL_TABLET | ORAL | 0 refills | Status: DC

## 2022-07-15 MED ORDER — FUROSEMIDE 20 MG PO TABS: ORAL_TABLET | ORAL | 6 refills | Status: DC

## 2022-07-15 MED ORDER — VALSARTAN 40 MG PO TABS: 40.0000 mg | ORAL_TABLET | Freq: Every day | ORAL | 5 refills | Status: DC

## 2022-07-15 NOTE — Progress Notes (Signed)
Subjective:    Patient ID: [name], male    DOB: August 23, 1956, 66 y.o.   MRN: [id number]  HPI This patient was seen today for chronic pain  The medication list was reviewed and updated.  Location of Pain for which the patient has been treated with regarding narcotics:   Onset of this pain:    -Compliance with medication: Oxycodone 10-325 mg  - Number patient states they take daily: 4  -when was the last dose patient took? 2 hours ago  The patient was advised the importance of maintaining medication and not using illegal substances with these.  Here for refills and follow up  The patient was educated that we can provide 3 monthly scripts for their medication, it is their responsibility to follow the instructions.  Side effects or complications from medications: none  Patient is aware that pain medications are meant to minimize the severity of the pain to allow their pain levels to improve to allow for better function. They are aware of that pain medications cannot totally remove their pain.  Due for UDT ( at least once per year) : completed today  Scale of 1 to 10 ( 1 is least 10 is most) Your pain level without the medicine: 10 Your pain level with medication: 5-6  Scale 1 to 10 ( 1-helps very little, 10 helps very well) How well does your pain medication reduce your pain so you can function better through out the day? 10  Quality of the pain:   Persistence of the pain:   Modifying factors:   Pt states pressure in left side of heart is lower, was supposed to be on something to increase it. Also requesting something for anxiety.     Encounter for long-term opiate analgesic use - Plan: ToxASSURE Select 13 (MW), Urine, oxyCODONE-acetaminophen (PERCOCET) 10-325 MG tablet  History of completed stroke - Plan: Basic metabolic panel, Ambulatory referral to Neurology, CANCELED: Basic metabolic panel  Chronic HFrEF (heart failure with reduced ejection fraction) ([place]) -  Plan: Basic metabolic panel, CANCELED: Basic metabolic panel, CANCELED: Basic metabolic panel  Need for vaccination - Plan: Flu Vaccine QUAD High Dose(Fluad)  Primary osteoarthritis of right knee - Plan: oxyCODONE-acetaminophen (PERCOCET) 10-325 MG tablet  Diabetic peripheral neuropathy ([place]) - Plan: oxyCODONE-acetaminophen (PERCOCET) 10-325 MG tablet   Review of Systems     Objective:   Physical Exam General-in no acute distress Eyes-no discharge Lungs-respiratory rate normal, CTA CV-no murmurs,RRR Extremities skin warm dry no edema Neuro grossly normal Behavior normal, alert        Assessment & Plan:   1. Encounter for long-term opiate analgesic use The patient was seen in followup for chronic pain. A review over at their current pain status was discussed. Drug registry was checked. Prescriptions were given.  Regular follow-up recommended. Discussion was held regarding the importance of compliance with medication as well as pain medication contract.  Patient was informed that medication may cause drowsiness and should not be combined  with other medications/alcohol or street drugs. If the patient feels medication is causing altered alertness then do not drive or operate dangerous equipment.  Should be noted that the patient appears to be meeting appropriate use of opioids and response.  Evidenced by improved function and decent pain control without significant side effects and no evidence of overt aberrancy issues.  Upon discussion with the patient today they understand that opioid therapy is optional and they feel that the pain has been refractory to reasonable conservative  measures and is significant and affecting quality of life enough to warrant ongoing therapy and wishes to continue opioids.  Refills were provided. Patient does benefit from pain medicine continue this currently drug registry was checked - ToxASSURE Select 13 (MW), Urine - oxyCODONE-acetaminophen  (PERCOCET) 10-325 MG tablet; Take one tablet by mouth every 6 hours prn pain  Dispense: 120 tablet; Refill: 0  2. History of completed stroke We will be wise for patient be seen by neurology for further evaluation to try to prevent further strokes.  We are doing our best to keep cholesterol, A1c, blood pressure under good control - Basic metabolic panel - Ambulatory referral to Neurology  3. Chronic HFrEF (heart failure with reduced ejection fraction) (HCC) Add valsartan low-dose we will follow-up metabolic 7 within 10 days Patient was educated regarding dietary measures as well as warning signs to watch for with CHF - Basic metabolic panel  4. Need for vaccination Today - Flu Vaccine QUAD High Dose(Fluad)  5. Primary osteoarthritis of right knee Pain medicine is primarily for his back as well as his knee - oxyCODONE-acetaminophen (PERCOCET) 10-325 MG tablet; Take one tablet by mouth every 6 hours prn pain  Dispense: 120 tablet; Refill: 0  6. Diabetic peripheral neuropathy (HCC) Pain medicine does help with his neuropathy as well Keeping A1c under good control is also wise - oxyCODONE-acetaminophen (PERCOCET) 10-325 MG tablet; Take one tablet by mouth every 6 hours prn pain  Dispense: 120 tablet; Refill: 0  Follow up 3 months

## 2022-07-15 NOTE — Patient Instructions (Addendum)
Hi [name]  #1 start valsartan 25 mg 1 per day-you may take this in the morning time.  It is designed to help your heart squeeze better.  #2 furosemide 20 mg you may take 1 each morning as needed for swelling in your lower legs  #3 please do blood work in 10 days-around October 9-Monday  #4 please follow-up here in approximately 2 to 3 weeks it is important to bring all of your medicine bottles with you when you come  #5 start sertraline 1/2 tablet each morning this will help with anxiety as well as your moods.  #6 you may maintain your pain medicine 1 taken 4 times daily as needed for pain  If any problems or setbacks please follow-up sooner.

## 2022-07-19 ENCOUNTER — Telehealth: Payer: Self-pay | Admitting: Family Medicine

## 2022-07-19 NOTE — Telephone Encounter (Signed)
Left message for patient to call back and schedule Medicare Annual Wellness Visit (AWV).  Please offer to do virtually or by telephone.  No hx of AWV eligible for AWVI per [facility] as of 07/13/2021  Please schedule at any time with [facility]-Nurse Health Advisor.      45 minute appointment   Any questions, please call me at [phone]

## 2022-07-20 ENCOUNTER — Encounter: Payer: Self-pay | Admitting: Family Medicine

## 2022-07-20 ENCOUNTER — Other Ambulatory Visit: Payer: Self-pay | Admitting: Family Medicine

## 2022-07-20 LAB — TOXASSURE SELECT 13 (MW), URINE

## 2022-07-26 DIAGNOSIS — L97522 Non-pressure chronic ulcer of other part of left foot with fat layer exposed: Secondary | ICD-10-CM | POA: Diagnosis not present

## 2022-07-26 DIAGNOSIS — E1142 Type 2 diabetes mellitus with diabetic polyneuropathy: Secondary | ICD-10-CM | POA: Diagnosis not present

## 2022-08-23 ENCOUNTER — Ambulatory Visit: Payer: Medicare HMO | Admitting: Family Medicine

## 2022-09-20 DIAGNOSIS — D485 Neoplasm of uncertain behavior of skin: Secondary | ICD-10-CM | POA: Diagnosis not present

## 2022-09-20 DIAGNOSIS — M79673 Pain in unspecified foot: Secondary | ICD-10-CM | POA: Diagnosis not present

## 2022-09-20 DIAGNOSIS — L97511 Non-pressure chronic ulcer of other part of right foot limited to breakdown of skin: Secondary | ICD-10-CM | POA: Diagnosis not present

## 2022-09-20 DIAGNOSIS — L97522 Non-pressure chronic ulcer of other part of left foot with fat layer exposed: Secondary | ICD-10-CM | POA: Diagnosis not present

## 2022-09-20 DIAGNOSIS — E1142 Type 2 diabetes mellitus with diabetic polyneuropathy: Secondary | ICD-10-CM | POA: Diagnosis not present

## 2022-09-20 DIAGNOSIS — L84 Corns and callosities: Secondary | ICD-10-CM | POA: Diagnosis not present

## 2022-09-20 DIAGNOSIS — B351 Tinea unguium: Secondary | ICD-10-CM | POA: Diagnosis not present

## 2022-09-20 DIAGNOSIS — L97319 Non-pressure chronic ulcer of right ankle with unspecified severity: Secondary | ICD-10-CM | POA: Diagnosis not present

## 2022-09-26 ENCOUNTER — Ambulatory Visit: Payer: Medicare HMO | Admitting: Neurology

## 2022-09-27 DIAGNOSIS — E1142 Type 2 diabetes mellitus with diabetic polyneuropathy: Secondary | ICD-10-CM | POA: Diagnosis not present

## 2022-09-27 DIAGNOSIS — L97312 Non-pressure chronic ulcer of right ankle with fat layer exposed: Secondary | ICD-10-CM | POA: Diagnosis not present

## 2022-10-04 DIAGNOSIS — I5022 Chronic systolic (congestive) heart failure: Secondary | ICD-10-CM | POA: Diagnosis not present

## 2022-10-04 DIAGNOSIS — Z8673 Personal history of transient ischemic attack (TIA), and cerebral infarction without residual deficits: Secondary | ICD-10-CM | POA: Diagnosis not present

## 2022-10-05 ENCOUNTER — Other Ambulatory Visit: Payer: Self-pay

## 2022-10-05 DIAGNOSIS — E875 Hyperkalemia: Secondary | ICD-10-CM

## 2022-10-05 LAB — BASIC METABOLIC PANEL
BUN: 20 mg/dL (ref 7–25)
CO2: 33 mmol/L — ABNORMAL HIGH (ref 20–32)
Calcium: 9.6 mg/dL (ref 8.6–10.3)
Chloride: 103 mmol/L (ref 98–110)
Creat: 1.06 mg/dL (ref 0.70–1.35)
Glucose, Bld: 99 mg/dL (ref 65–99)
Potassium: 5.4 mmol/L — ABNORMAL HIGH (ref 3.5–5.3)
Sodium: 141 mmol/L (ref 135–146)

## 2022-10-18 DIAGNOSIS — L97312 Non-pressure chronic ulcer of right ankle with fat layer exposed: Secondary | ICD-10-CM | POA: Diagnosis not present

## 2022-10-18 DIAGNOSIS — E1142 Type 2 diabetes mellitus with diabetic polyneuropathy: Secondary | ICD-10-CM | POA: Diagnosis not present

## 2022-10-18 DIAGNOSIS — I739 Peripheral vascular disease, unspecified: Secondary | ICD-10-CM | POA: Diagnosis not present

## 2022-10-22 ENCOUNTER — Other Ambulatory Visit: Payer: Self-pay | Admitting: Family Medicine

## 2022-10-24 NOTE — Progress Notes (Unsigned)
Cardiology Office Note   Date:  10/25/2022   ID:  [name], DOB 21-Jun-1956, MRN [id number]  PCP:  Kathyrn Drown, MD  Cardiologist:   Dorris Carnes, MD  ' Pt presents for follow up of CAD     History of Present Illness: [name] is a 67 y.o. male with a history of DM with neuropathy,  CAD (s/p BMS to RCA in 2008), PVOD (s/p amutation of R first toe 2016), CVA  who is followed by Lance Sell  I saw the pt in 2022 as a preop assessment   Myoview after  showed infarct but no ischemia  LVEF 28%  Echo showed LVEF 30 to 35%  Repeat echo in Jun 2022 showed no change  He wa seen by B Strader in Fall 2023    The pt denies CP  Breathing is stable, OK   No LE edema   No palpitations   No dizziness  Activity is very limited due to orthopedic problems    Diet: Lunch (11 am)  Bowl soup   Crackers  Veggie   Chicken quessidila   Diet koolaid Snack   Potato chips   Banana  Orange Dinner   Chicken  Ribs   Veggies    Desserts:  Banana mik                    +   Current Meds  Medication Sig   aspirin 325 MG tablet Take 325 mg by mouth every evening.    atorvastatin (LIPITOR) 10 MG tablet TAKE ONE TABLET BY MOUTH ON MONDAY AND FRIDAY   betamethasone valerate ointment (VALISONE) 0.1 % APPLY 1 APPLICATION TOPICALLY 2 (TWO) TIMES DAILY AS NEEDED. (Patient taking differently: Apply 1 application  topically 2 (two) times daily as needed (Psoriasis).)   ergocalciferol (VITAMIN D2) 1.25 MG (50000 UT) capsule Take 1 capsule by mouth once a week.   furosemide (LASIX) 20 MG tablet Take 1 tablet by mouth every morning as needed   insulin detemir (LEVEMIR FLEXPEN) 100 UNIT/ML FlexPen INJECT 54 UNITS AT BEDTIME - MAY TITRATE UP TO 70 UNITS INTO SKIN AT BEDTIME FOR DIABETES   Insulin Pen Needle (B-D ULTRAFINE III SHORT PEN) 31G X 8 MM MISC USE PEN NEEDLES DAILY WITH LANTUS   metFORMIN (GLUCOPHAGE-XR) 500 MG 24 hr tablet TAKE 1 TABLET BY MOUTH EVERY DAY WITH BREAKFAST   metoprolol succinate (TOPROL XL) 25  MG 24 hr tablet Take 1 tablet (25 mg total) by mouth daily.   Multiple Vitamins-Minerals (MULTIVITAMIN WITH MINERALS) tablet Take 1 tablet by mouth daily.   mupirocin ointment (BACTROBAN) 2 % Apply topically daily.   nystatin (MYCOSTATIN/NYSTOP) powder Apply topically 3 (three) times daily.   oxyCODONE-acetaminophen (PERCOCET) 10-325 MG tablet Take one tablet by mouth every 6 hours prn pain   potassium chloride (KLOR-CON M10) 10 MEQ tablet TAKE 1 TABLET BY MOUTH EVERY DAY IN THE MORNING   sertraline (ZOLOFT) 50 MG tablet Take 1 tablet (50 mg total) by mouth daily.   silver sulfADIAZINE (SILVADENE) 1 % cream Apply 1 Application topically daily.   valsartan (DIOVAN) 40 MG tablet Take 1 tablet (40 mg total) by mouth daily.     Allergies:   Metformin and related, Statins, and Vicodin [hydrocodone-acetaminophen]   Past Medical History:  Diagnosis Date   CAD (coronary artery disease)    a. s/p BMS to RCA in 2008 b. no ischemia by NST in 03/2021   Cardiomyopathy ([place])  CHF (congestive heart failure) ([place])    a. EF 30-35% in 2019 with similar results by repeat echo in 03/2021   Depression    DJD (degenerative joint disease)    DM type 2 with diabetic mixed hyperlipidemia ([place]) 12/11/2018   Essential hypertension    Gout    History of inferior wall myocardial infarction 2008   Hypercholesterolemia    Hyperlipidemia    Obesity    Oral candidiasis    Peripheral vascular disease ([place])    Stroke [facility])    December 2019   Type 2 diabetes mellitus [facility])     Past Surgical History:  Procedure Laterality Date   AMPUTATION Right 01/12/2015   Procedure: PARTIAL AMPUTATION 1ST RAY RIGHT FOOT (Amputation of right great toe and portion of the first metatarsal bone right foot);  Surgeon: Caprice Beaver, DPM;  Location: [facility] ORS;  Service: Podiatry;  Laterality: Right;   AMPUTATION Right 09/14/2016   Procedure: PARTIAL AMPUTATION RAY 2ND TOE RIGHT FOOT;  Surgeon: Caprice Beaver, DPM;  Location:  [facility] ORS;  Service: Podiatry;  Laterality: Right;   COLONOSCOPY  08/2008   COLONOSCOPY WITH PROPOFOL N/A 06/02/2020   Procedure: COLONOSCOPY WITH PROPOFOL;  Surgeon: [name], DO;  Location: [facility] SU[facility];  Service: Endoscopy;  Laterality: N/A;  7:30am   ESOPHAGOGASTRODUODENOSCOPY  08/2008   LAMINOTOMY  2001, 2006     Right L4-5 interlaminar laminotomy with excision of herniated   disc with operating microscope.   NECK SURGERY     cervical disc   ORIF WRIST FRACTURE Right    PERIPHERAL VASCULAR CATHETERIZATION N/A 02/27/2015   Procedure: Abdominal Aortogram;  Surgeon: Elam Dutch, MD;  Location: [place] CV LAB;  Service: Cardiovascular;  Laterality: N/A;   PERIPHERAL VASCULAR CATHETERIZATION Bilateral 02/27/2015   Procedure: Lower Extremity Angiography;  Surgeon: Elam Dutch, MD;  Location: [place] CV LAB;  Service: Cardiovascular;  Laterality: Bilateral;   POLYPECTOMY  06/02/2020   Procedure: POLYPECTOMY;  Surgeon: [name], DO;  Location: [facility];  Service: Endoscopy;;   Right ankle fracture open reduction internal fixation.  2003   WOUND DEBRIDEMENT Right 12/24/2014   Procedure: DEBRIDEMENT SOFT TISSUE AND BONE RIGHT FOOT;  Surgeon: Caprice Beaver, DPM;  Location: [facility] ORS;  Service: Podiatry;  Laterality: Right;     Social History:  The patient  reports that he quit smoking about 51 years ago. His smoking use included cigarettes. He has a 10.00 pack-year smoking history. He has never used smokeless tobacco. He reports current drug use. Drug: Marijuana. He reports that he does not drink alcohol.   Family History:  The patient's family history includes Cancer in his sister; Colon cancer in his mother; Diabetes in his father, mother, and sister; Heart disease in his sister; Lung cancer in his father.    ROS:  Please see the history of present illness. All other systems are reviewed and  Negative to the above problem except as noted.    PHYSICAL  EXAM: VS:  BP (!) 146/84   Pulse 84   Ht '6\' 2"'$  (1.88 m)   Wt 252 lb 3.2 oz (114.4 kg)   SpO2 97%   BMI 32.38 kg/m   GEN: Well nourished, well developed, in no acute distress  HEENT: normal  Neck: no JVD, no carotid bruit Cardiac: RRR; no murmur  No LE edema  Tr  PT pulse   Respiratory:  clear to auscultation bilaterally, GI: soft, nontender, nondistended, + BS No RUQ  tenderness MS: Moving extremities     Did not examine feet   Skin: warm and dry  Excor lesions on legs   Neuro:  Strength and sensation are intact Psych: euthymic mood, full affect   EKG:  EKG shows SR 84 bpm  First degree AV block  PR 212 msec  IWMI     NST: 03/2021 Lexiscan stress is electrically nondiagnostic due to baseline changes Myoview scan shows a large defect in the inferior and inferolateral walls (base/mid/distal) consistent with scan Cannot completely exclude some soft tissue attenuation (diaphragm) No ischemia. LVEF calculated at 28% with hypokinesis worse in the inferior/inferolateral walls Would recomm echo to confirm LVEF and wall motion. This is a high risk study.   Echocardiogram: 03/2021 IMPRESSIONS     1. Mid and basal inferior wall akinesis septal and apica severe  hypokinesis EF similar to echo done 09/18/2018. Left ventricular ejection  fraction, by estimation, is 30 to 35%. The left ventricle has moderately  decreased function. The left ventricle  demonstrates regional wall motion abnormalities (see scoring  diagram/findings for description). The left ventricular internal cavity  size was moderately dilated. There is mild left ventricular hypertrophy.  Left ventricular diastolic parameters were  normal.   2. Right ventricular systolic function is normal. The right ventricular  size is normal.   3. Left atrial size was moderately dilated.   4. The mitral valve is normal in structure. Trivial mitral valve  regurgitation. No evidence of mitral stenosis.   5. The aortic valve is tricuspid.  Aortic valve regurgitation is not  visualized. Mild to moderate aortic valve sclerosis/calcification is  present, without any evidence of aortic stenosis.   6. The inferior vena cava is normal in size with greater than 50%  respiratory variability, suggesting right atrial pressure of 3 mmHg.    Carotid Dopplers: 05/2022 IMPRESSION: Color duplex indicates minimal heterogeneous plaque, with no hemodynamically significant stenosis by duplex criteria in the extracranial cerebrovascular circulation Lipid Panel    Component Value Date/Time   CHOL 116 02/25/2022 1122   CHOL 131 06/15/2015 0900   TRIG 45 02/25/2022 1122   HDL 44 02/25/2022 1122   HDL 33 (L) 06/15/2015 0900   CHOLHDL 2.6 02/25/2022 1122   VLDL 21 09/19/2018 0501   LDLCALC 59 02/25/2022 1122      Wt Readings from Last 3 Encounters:  10/25/22 252 lb 3.2 oz (114.4 kg)  07/15/22 249 lb 3.2 oz (113 kg)  07/05/22 241 lb (109.3 kg)      ASSESSMENT AND PLAN:  1  CAD  Pt with remote intervention to RCA Myoview in 2022 showed no ischemia   Scar with depresssed LVEF   No symptoms to sugg angina   Follow   2  Chronic systolic CHF    Pt is probably Class II  Does not do much  Volume status is OK    Need to get toward GDMT   Stop KCL   get BMET and BNP in 2 wks     Keep on other meds for now    I want him to get BP cuff and check BP at home  Will adjust meds based on BP and labs to GDMT Wil  review AICD with EP  3  PVOD    Carotid USN in 2022 minimal CAD   Will get LE dopplers      4  HTN  BP is high today   Has not been high   Follow before changes  5  HL LDL 53  HD   44   6   Hx CVA  I  7  Hx DM  Last A1C was 7.3 on last check   Reviewed  diet with patient    Follow up in 4 to 6 wks   Current medicines are reviewed at length with the patient today.  The patient does not have concerns regarding medicines.  Signed, Dorris Carnes, MD  10/25/2022 10:50 AM    [address],  [place], [place]  [address] Phone: [phone]; Fax: [phone]

## 2022-10-25 ENCOUNTER — Ambulatory Visit: Payer: Medicare HMO | Attending: Internal Medicine | Admitting: Internal Medicine

## 2022-10-25 ENCOUNTER — Encounter: Payer: Self-pay | Admitting: Internal Medicine

## 2022-10-25 VITALS — BP 146/84 | HR 84 | Ht 74.0 in | Wt 252.2 lb

## 2022-10-25 DIAGNOSIS — Z79899 Other long term (current) drug therapy: Secondary | ICD-10-CM

## 2022-10-25 DIAGNOSIS — I10 Essential (primary) hypertension: Secondary | ICD-10-CM

## 2022-10-25 NOTE — Patient Instructions (Signed)
Medication Instructions:   Stop Taking Potassium   *If you need a refill on your cardiac medications before your next appointment, please call your pharmacy*   Lab Work: Your physician recommends that you return for lab work in: 2 Weeks ( BNP, BMET)   If you have labs (blood work) drawn today and your tests are completely normal, you will receive your results only by: [place] (if you have MyChart) OR A paper copy in the mail If you have any lab test that is abnormal or we need to change your treatment, we will call you to review the results.   Testing/Procedures: NONE    Follow-Up: At [facility], you and your health needs are our priority.  As part of our continuing mission to provide you with exceptional heart care, we have created designated Provider Care Teams.  These Care Teams include your primary Cardiologist (physician) and Advanced Practice Providers (APPs -  Physician Assistants and Nurse Practitioners) who all work together to provide you with the care you need, when you need it.  We recommend signing up for the patient portal called "MyChart".  Sign up information is provided on this After Visit Summary.  MyChart is used to connect with patients for Virtual Visits (Telemedicine).  Patients are able to view lab/test results, encounter notes, upcoming appointments, etc.  Non-urgent messages can be sent to your provider as well.   To learn more about what you can do with MyChart, go to [email or web address].    Your next appointment:   4 -6 week(s)  The format for your next appointment:   In Person  Provider:   You may see Dorris Carnes, MD or one of the following Advanced Practice Providers on your designated Care Team:   Bernerd Pho, PA-C  Ermalinda Barrios, PA-C     Other Instructions Thank you for choosing [facility]!    Important Information About Sugar

## 2022-10-26 ENCOUNTER — Telehealth: Payer: Self-pay | Admitting: Family Medicine

## 2022-10-26 ENCOUNTER — Other Ambulatory Visit: Payer: Self-pay | Admitting: Family Medicine

## 2022-10-26 NOTE — Telephone Encounter (Signed)
Nurses Patient's insurance no longer covers Levemir Now covering Lantus Please touch base with family/patient How much lab marrow insulin is using per day? Is patient following with [place] for diabetes or endocrinology?  Also patient is a chronic pain management patient he needs to have a follow-up office visit within the next 30 days in order for [place] to continue his pain meds  We may well need to change his insulin over to Lantus but for signing the above answered  Thank you

## 2022-10-27 ENCOUNTER — Telehealth: Payer: Self-pay | Admitting: Family Medicine

## 2022-10-27 ENCOUNTER — Other Ambulatory Visit: Payer: Self-pay | Admitting: Family Medicine

## 2022-10-27 DIAGNOSIS — M1711 Unilateral primary osteoarthritis, right knee: Secondary | ICD-10-CM

## 2022-10-27 DIAGNOSIS — E1142 Type 2 diabetes mellitus with diabetic polyneuropathy: Secondary | ICD-10-CM

## 2022-10-27 DIAGNOSIS — Z79891 Long term (current) use of opiate analgesic: Secondary | ICD-10-CM

## 2022-10-27 MED ORDER — OXYCODONE-ACETAMINOPHEN 10-325 MG PO TABS: ORAL_TABLET | ORAL | 0 refills | Status: DC

## 2022-10-27 NOTE — Telephone Encounter (Signed)
Spoke with patient's wife she states he is willing to go ahead with change to lantus per insurance requirement. Has been injecting levemir 56 u daily, but is now out of it. has seen endocrinology in the past but not recently . She adds he has wound care appt on 11/07/22. Follow up appt scheduled 10/31/22.

## 2022-10-27 NOTE — Telephone Encounter (Signed)
7 days sent in follow up as planned next week

## 2022-10-27 NOTE — Telephone Encounter (Signed)
May go ahead and send in month supply of Lantus same units.  May titrate up to 60 units if necessary.  May have 1 month with 5 refills keep follow-up visit we will discuss lab work pain medicine at that time Temporary supply pain medicine was sent in

## 2022-10-27 NOTE — Telephone Encounter (Signed)
Patient is requesting   oxyCODONE-acetaminophen (PERCOCET) 10-325 MG table- sent to [facility]

## 2022-10-28 ENCOUNTER — Other Ambulatory Visit: Payer: Self-pay

## 2022-10-28 DIAGNOSIS — E1169 Type 2 diabetes mellitus with other specified complication: Secondary | ICD-10-CM

## 2022-10-28 MED ORDER — INSULIN GLARGINE 100 UNITS/ML SOLOSTAR PEN: PEN_INJECTOR | SUBCUTANEOUS | 0 refills | Status: DC

## 2022-10-28 MED ORDER — INSULIN GLARGINE 100 UNITS/ML SOLOSTAR PEN: PEN_INJECTOR | SUBCUTANEOUS | 1 refills | Status: DC

## 2022-10-28 NOTE — Telephone Encounter (Signed)
Patient has been informed per drs notes and recommendations.

## 2022-10-31 ENCOUNTER — Ambulatory Visit (INDEPENDENT_AMBULATORY_CARE_PROVIDER_SITE_OTHER): Payer: Medicare HMO | Admitting: Family Medicine

## 2022-10-31 VITALS — BP 110/63 | HR 66 | Temp 98.8°F | Ht 74.0 in | Wt 259.0 lb

## 2022-10-31 DIAGNOSIS — E1169 Type 2 diabetes mellitus with other specified complication: Secondary | ICD-10-CM

## 2022-10-31 DIAGNOSIS — G8929 Other chronic pain: Secondary | ICD-10-CM | POA: Diagnosis not present

## 2022-10-31 DIAGNOSIS — Z89419 Acquired absence of unspecified great toe: Secondary | ICD-10-CM

## 2022-10-31 DIAGNOSIS — I10 Essential (primary) hypertension: Secondary | ICD-10-CM

## 2022-10-31 DIAGNOSIS — E1142 Type 2 diabetes mellitus with diabetic polyneuropathy: Secondary | ICD-10-CM

## 2022-10-31 DIAGNOSIS — E11622 Type 2 diabetes mellitus with other skin ulcer: Secondary | ICD-10-CM | POA: Diagnosis not present

## 2022-10-31 DIAGNOSIS — E782 Mixed hyperlipidemia: Secondary | ICD-10-CM | POA: Diagnosis not present

## 2022-10-31 DIAGNOSIS — Z125 Encounter for screening for malignant neoplasm of prostate: Secondary | ICD-10-CM

## 2022-10-31 DIAGNOSIS — L97909 Non-pressure chronic ulcer of unspecified part of unspecified lower leg with unspecified severity: Secondary | ICD-10-CM

## 2022-10-31 DIAGNOSIS — M1711 Unilateral primary osteoarthritis, right knee: Secondary | ICD-10-CM | POA: Diagnosis not present

## 2022-10-31 DIAGNOSIS — I5022 Chronic systolic (congestive) heart failure: Secondary | ICD-10-CM | POA: Diagnosis not present

## 2022-10-31 DIAGNOSIS — Z79891 Long term (current) use of opiate analgesic: Secondary | ICD-10-CM

## 2022-10-31 DIAGNOSIS — I739 Peripheral vascular disease, unspecified: Secondary | ICD-10-CM

## 2022-10-31 MED ORDER — DOXYCYCLINE HYCLATE 100 MG PO CAPS: 100.0000 mg | ORAL_CAPSULE | Freq: Two times a day (BID) | ORAL | 0 refills | Status: DC

## 2022-10-31 MED ORDER — MUPIROCIN 2 % EX OINT: TOPICAL_OINTMENT | Freq: Every day | CUTANEOUS | 1 refills | Status: DC

## 2022-10-31 MED ORDER — OXYCODONE-ACETAMINOPHEN 10-325 MG PO TABS: ORAL_TABLET | ORAL | 0 refills | Status: DC

## 2022-10-31 NOTE — Progress Notes (Signed)
Subjective:    Patient ID: [name], male    DOB: 1956/04/25, 67 y.o.   MRN: [id number]  HPI.This patient was seen today for chronic pain  The medication list was reviewed and updated.  Location of Pain for which the patient has been treated with regarding narcotics: knee pain  Onset of this pain: years   -Compliance with medication: yes  - Number patient states they take daily: 4 x daily  -when was the last dose patient took? 4 am   The patient was advised the importance of maintaining medication and not using illegal substances with these.  Here for refills and follow up  The patient was educated that we can provide 3 monthly scripts for their medication, it is their responsibility to follow the instructions.  Side effects or complications from medications: no  Patient is aware that pain medications are meant to minimize the severity of the pain to allow their pain levels to improve to allow for better function. They are aware of that pain medications cannot totally remove their pain.  Due for UDT ( at least once per year) : 07/20/2022  Scale of 1 to 10 ( 1 is least 10 is most) Your pain level without the medicine: 5 Your pain level with medication 8  Scale 1 to 10 ( 1-helps very little, 10 helps very well) How well does your pain medication reduce your pain so you can function better through out the day? 10  Quality of the pain: stabbing pain, dull pain  Persistence of the pain: constant  Modifying factors: bearable Essential hypertension, benign - Plan: Basic Metabolic Panel  Encounter for long-term opiate analgesic use - Plan: oxyCODONE-acetaminophen (PERCOCET) 10-325 MG tablet  Diabetic peripheral neuropathy (HCC) - Plan: oxyCODONE-acetaminophen (PERCOCET) 10-325 MG tablet  Primary osteoarthritis of right knee - Plan: oxyCODONE-acetaminophen (PERCOCET) 10-325 MG tablet  Diabetic ulcer of lower leg ([place])  Peripheral vascular disease ([place])  Encounter  for chronic pain management  DM type 2 with diabetic mixed hyperlipidemia ([place]) - Plan: Hemoglobin A1c  Mixed hyperlipidemia - Plan: Lipid panel  Screening PSA (prostate specific antigen) - Plan: PSA  Patient has a history of a previous partial amputation of the right foot.  Now he has an ulcer that appeared on the right side of his lower leg.  No particular reason that this is occurred.  He will be seeing the wound center later this month.  He relates some pain and discomfort with this area.  He does have peripheral vascular disease.  He also has difficult to control diabetes and chronic pain In addition to this he also has some significant issues with impaired ejection fraction of the heart as well as mild cognitive issues related to his multi-infarct dementia       Review of Systems     Objective:   Physical Exam General-in no acute distress Eyes-no discharge Lungs-respiratory rate normal, CTA CV-no murmurs,RRR Extremities skin warm dry no edema Neuro grossly normal Behavior normal, alert Partial amputation of right foot noted Ulcer noted on the right side of the ankle and minimal redness       Assessment & Plan:  1. Encounter for long-term opiate analgesic use The patient was seen in followup for chronic pain. A review over at their current pain status was discussed. Drug registry was checked. Prescriptions were given.  Regular follow-up recommended. Discussion was held regarding the importance of compliance with medication as well as pain medication contract.  Patient was informed  that medication may cause drowsiness and should not be combined  with other medications/alcohol or street drugs. If the patient feels medication is causing altered alertness then do not drive or operate dangerous equipment.  Should be noted that the patient appears to be meeting appropriate use of opioids and response.  Evidenced by improved function and decent pain control without significant  side effects and no evidence of overt aberrancy issues.  Upon discussion with the patient today they understand that opioid therapy is optional and they feel that the pain has been refractory to reasonable conservative measures and is significant and affecting quality of life enough to warrant ongoing therapy and wishes to continue opioids.  Refills were provided. Maintain oxycodone no more than 4 pills/day - oxyCODONE-acetaminophen (PERCOCET) 10-325 MG tablet; Take one tablet by mouth every 6 hours prn pain  Dispense: 120 tablet; Refill: 0  2. Diabetic peripheral neuropathy ([place]) Pain medicine as necessary very important to keep A1c looking good check lab work - oxyCODONE-acetaminophen (PERCOCET) 10-325 MG tablet; Take one tablet by mouth every 6 hours prn pain  Dispense: 120 tablet; Refill: 0  3. Primary osteoarthritis of right knee Check lab work await results Pain medicine as necessary Tylenol as needed - oxyCODONE-acetaminophen (PERCOCET) 10-325 MG tablet; Take one tablet by mouth every 6 hours prn pain  Dispense: 120 tablet; Refill: 0  4. Diabetic ulcer of lower leg (HCC) Doxycycline as well as Bactroban ointment has appointment with wound center later this month very important to keep it  5. Essential hypertension, benign Blood pressure good control continue current measures - Basic Metabolic Panel  6. Peripheral vascular disease ([place]) Will see wound center  7. Encounter for chronic pain management The patient was seen in followup for chronic pain. A review over at their current pain status was discussed. Drug registry was checked. Prescriptions were given.  Regular follow-up recommended. Discussion was held regarding the importance of compliance with medication as well as pain medication contract.  Patient was informed that medication may cause drowsiness and should not be combined  with other medications/alcohol or street drugs. If the patient feels medication is causing altered  alertness then do not drive or operate dangerous equipment.  Should be noted that the patient appears to be meeting appropriate use of opioids and response.  Evidenced by improved function and decent pain control without significant side effects and no evidence of overt aberrancy issues.  Upon discussion with the patient today they understand that opioid therapy is optional and they feel that the pain has been refractory to reasonable conservative measures and is significant and affecting quality of life enough to warrant ongoing therapy and wishes to continue opioids.  Refills were provided.   8. DM type 2 with diabetic mixed hyperlipidemia (HCC) Check A1c continue Lantus on a regular basis watch diet - Hemoglobin A1c  9. Mixed hyperlipidemia Check lipid profile await results - Lipid panel  10. Screening PSA (prostate specific antigen) Check PSA - PSA Follow-up in 3 months sooner problems  Underlying heart failure issues.  Stable under cardiology's care History partial amputation with new ulcer on the right side of his leg Follow-up within 2-1/2 months

## 2022-11-04 NOTE — Progress Notes (Addendum)
BRANTLY, KALMAN ([id number]) [id number] Page 1 of 12 Visit Report for 11/07/2022 Allergy List Details Patient Name: Date of Service: [name] 11/07/2022 10:00 A M Medical Record Number: [id number] Patient Account Number: [id number] Date of Birth/Sex: Treating RN: [birth year] (67 y.o. Waldron Session Primary Care Marvin Grabill: Kathyrn Drown Other Clinician: Referring Natasha Burda: Treating Mackson Botz/Extender: Cherene Julian in Treatment: 0 Allergies Active Allergies metformin Statins-HMG-CoA Reductase Inhibitors Vicodin Allergy Notes Electronic Signature(s) Signed: 11/07/2022 4:52:32 PM By: Blanche East RN Previous Signature: 11/04/2022 4:01:30 PM Version By: Blanche East RN Entered By: Blanche East on 11/07/2022 09:53:00 -------------------------------------------------------------------------------- Arrival Information Details Patient Name: Date of Service: [name], [name] 11/07/2022 10:00 A M Medical Record Number: [id number] Patient Account Number: [id number] Date of Birth/Sex: Treating RN: [birth year] (67 y.o. Waldron Session Primary Care Lennell Shanks: Kathyrn Drown Other Clinician: Referring Shanti Eichel: Treating Issaih Kaus/Extender: Cherene Julian in Treatment: 0 Visit Information Patient Arrived: Ambulatory Arrival Time: 10:25 Accompanied By: Wife Transfer Assistance: None Patient Identification Verified: Yes Secondary Verification Process Completed: Yes Patient Requires Transmission-Based Precautions: No Patient Has Alerts: Yes Patient Alerts: ABI RLE [facility] Electronic Signature(s) Signed: 11/07/2022 11:44:43 AM By: Blanche East RN Entered By: Blanche East on 11/07/2022 11:44:42 [name] ([id number]) [id number].pdf Page 2 of 12 -------------------------------------------------------------------------------- Clinic Level of Care Assessment Details Patient Name:  Date of Service: [name], [name] 11/07/2022 10:00 A M Medical Record Number: [id number] Patient Account Number: [id number] Date of Birth/Sex: Treating RN: [birth year] (67 y.o. Waldron Session Primary Care Torren Maffeo: Kathyrn Drown Other Clinician: Referring Brayon Bielefeld: Treating Keeghan Bialy/Extender: Cherene Julian in Treatment: 0 Clinic Level of Care Assessment Items TOOL 1 Quantity Score X- 1 0 Use when EandM and Procedure is performed on INITIAL visit ASSESSMENTS - Nursing Assessment / Reassessment X- 1 20 General Physical Exam (combine w/ comprehensive assessment (listed just below) when performed on new pt. evals) X- 1 25 Comprehensive Assessment (HX, ROS, Risk Assessments, Wounds Hx, etc.) ASSESSMENTS - Wound and Skin Assessment / Reassessment '[]'$  - 0 Dermatologic / Skin Assessment (not related to wound area) ASSESSMENTS - Ostomy and/or Continence Assessment and Care '[]'$  - 0 Incontinence Assessment and Management '[]'$  - 0 Ostomy Care Assessment and Management (repouching, etc.) PROCESS - Coordination of Care '[]'$  - 0 Simple Patient / Family Education for ongoing care X- 1 20 Complex (extensive) Patient / Family Education for ongoing care X- 1 10 Staff obtains Programmer, systems, Records, T Results / Process Orders est X- 1 10 Staff telephones HHA, Nursing Homes / Clarify orders / etc '[]'$  - 0 Routine Transfer to another Facility (non-emergent condition) '[]'$  - 0 Routine Hospital Admission (non-emergent condition) X- 1 15 New Admissions / Biomedical engineer / Ordering NPWT Apligraf, etc. , '[]'$  - 0 Emergency Hospital Admission (emergent condition) PROCESS - Special Needs '[]'$  - 0 Pediatric / Minor Patient Management '[]'$  - 0 Isolation Patient Management '[]'$  - 0 Hearing / Language / Visual special needs '[]'$  - 0 Assessment of Community assistance (transportation, D/C planning, etc.) '[]'$  - 0 Additional assistance / Altered mentation '[]'$  - 0 Support Surface(s)  Assessment (bed, cushion, seat, etc.) INTERVENTIONS - Miscellaneous '[]'$  - 0 External ear exam '[]'$  - 0 Patient Transfer (multiple staff / Civil Service fast streamer / Similar devices) '[]'$  - 0 Simple Staple / Suture removal (25 or less) '[]'$  - 0 Complex Staple / Suture removal (26 or more) '[]'$  - 0 Hypo/Hyperglycemic Management (do not check if billed separately) X-  1 15 Ankle / Brachial Index (ABI) - do not check if billed separately Has the patient been seen at the hospital within the last three years: Yes Total Score: 115 Level Of Care: New/Established - Level 3 Electronic Signature(s) Signed: 11/07/2022 4:52:32 PM By: Blanche East RN Earle Gell 11/07/2022 4:52:32 PM By: Blanche East RN Signed: D ([id number]) [phone].pdf Page 3 of 12 Entered By: Blanche East on 11/07/2022 10:53:21 -------------------------------------------------------------------------------- Compression Therapy Details Patient Name: Date of Service: [name] 11/07/2022 10:00 A M Medical Record Number: [id number] Patient Account Number: [id number] Date of Birth/Sex: Treating RN: [birth year] (67 y.o. Waldron Session Primary Care Hesper Venturella: Kathyrn Drown Other Clinician: Referring Braysen Cloward: Treating Peniel Hass/Extender: Cherene Julian in Treatment: 0 Compression Therapy Performed for Wound Assessment: Wound #1 Right,Lateral [name] Leg Performed By: Clinician Blanche East, RN Compression Type: Three Layer Post Procedure Diagnosis Same as Pre-procedure Electronic Signature(s) Signed: 11/07/2022 12:32:14 PM By: Blanche East RN Entered By: Blanche East on 11/07/2022 12:32:14 -------------------------------------------------------------------------------- Compression Therapy Details Patient Name: Date of Service: [name] Setters D. 11/07/2022 10:00 A M Medical Record Number: [id number] Patient Account Number: [id number] Date of Birth/Sex: Treating RN: 03-Mar-1956 (67 y.o.  Waldron Session Primary Care Tyeson Tanimoto: Kathyrn Drown Other Clinician: Referring Linsy Ehresman: Treating Heiress Williamson/Extender: Cherene Julian in Treatment: 0 Compression Therapy Performed for Wound Assessment: Wound #3 Right Metatarsal head second Performed By: Clinician Blanche East, RN Compression Type: Three Layer Post Procedure Diagnosis Same as Pre-procedure Electronic Signature(s) Signed: 11/07/2022 12:32:14 PM By: Blanche East RN Entered By: Blanche East on 11/07/2022 12:32:14 -------------------------------------------------------------------------------- Compression Therapy Details Patient Name: Date of Service: [name] Setters D. 11/07/2022 10:00 A M Medical Record Number: [id number] Patient Account Number: [id number] Date of Birth/Sex: Treating RN: [birth year] (67 y.o. Waldron Session Primary Care Mamye Bolds: Kathyrn Drown Other Clinician: Referring Semaj Kham: Treating Mihir Flanigan/Extender: Cherene Julian in Treatment: 0 Compression Therapy Performed for Wound Assessment: Wound #2 Right,Lateral Ankle [name], [name] ([id number]) [phone].pdf Page 4 of 12 Performed By: Clinician Blanche East, RN Compression Type: Three Layer Post Procedure Diagnosis Same as Pre-procedure Electronic Signature(s) Signed: 11/07/2022 12:32:15 PM By: Blanche East RN Entered By: Blanche East on 11/07/2022 12:32:14 -------------------------------------------------------------------------------- Encounter Discharge Information Details Patient Name: Date of Service: [name], [name] 11/07/2022 10:00 Sisters Record Number: [id number] Patient Account Number: [id number] Date of Birth/Sex: Treating RN: 1956/07/25 (67 y.o. Waldron Session Primary Care Yevonne Yokum: Kathyrn Drown Other Clinician: Referring Dacari Beckstrand: Treating Ozie Lupe/Extender: Cherene Julian in Treatment: 0 Encounter Discharge  Information Items Post Procedure Vitals Discharge Condition: Stable Temperature (F): 98.1 Ambulatory Status: Ambulatory Pulse (bpm): 91 Discharge Destination: Home Respiratory Rate (breaths/min): 20 Transportation: Private Auto Blood Pressure (mmHg): 172/91 Accompanied By: wife Schedule Follow-up Appointment: Yes Clinical Summary of Care: Electronic Signature(s) Signed: 11/07/2022 11:36:25 AM By: Blanche East RN Entered By: Blanche East on 11/07/2022 11:36:25 -------------------------------------------------------------------------------- [name] Extremity Assessment Details Patient Name: Date of Service: [name] 11/07/2022 10:00 A M Medical Record Number: [id number] Patient Account Number: [id number] Date of Birth/Sex: Treating RN: [birth year] (67 y.o. Waldron Session Primary Care Cherilynn Schomburg: Kathyrn Drown Other Clinician: Referring Glynnis Gavel: Treating Allana Shrestha/Extender: Cherene Julian in Treatment: 0 Edema Assessment Assessed: [Left: No] [Right: No] [Left: Edema] [Right: :] Calf Left: Right: Point of Measurement: From Medial Instep 43 cm Ankle Left: Right: Point of Measurement: From Medial Instep 28.5 cm Vascular Assessment Earle Gell  D ([id number]) [[id number] Page 5 of 12] Pulses: Dorsalis Pedis Palpable: [Right:Yes Yes] Electronic Signature(s) Signed: 11/07/2022 4:52:32 PM By: Blanche East RN Entered By: Blanche East on 11/07/2022 10:12:20 -------------------------------------------------------------------------------- Multi Wound Chart Details Patient Name: Date of Service: [name], [name] 11/07/2022 10:00 A M Medical Record Number: [id number] Patient Account Number: [id number] Date of Birth/Sex: Treating RN: 01/18/1956 (67 y.o. M) Primary Care Modestine Scherzinger: Kathyrn Drown Other Clinician: Referring Pauletta Pickney: Treating Maiya Kates/Extender: Cherene Julian in Treatment:  0 Vital Signs Height(in): 74 Capillary Blood Glucose(mg/dl): 200 Weight(lbs): 252 Pulse(bpm): 54 Body Mass Index(BMI): 32.4 Blood Pressure(mmHg): 172/91 Temperature(F): 98.1 Respiratory Rate(breaths/min): 20 [1:Photos:] Right, Lateral [name] Leg Right, Lateral Ankle Right Metatarsal head second Wound Location: Surgical Injury Pressure Injury Pressure Injury Wounding Event: Diabetic Wound/Ulcer of the [name] Diabetic Wound/Ulcer of the [name] Diabetic Wound/Ulcer of the [name] Primary Etiology: Extremity Extremity Extremity Congestive Heart Failure, Congestive Heart Failure, Congestive Heart Failure, Comorbid History: Hypertension, Peripheral Venous Hypertension, Peripheral Venous Hypertension, Peripheral Venous Disease, Type II Diabetes, Disease, Type II Diabetes, Disease, Type II Diabetes, Osteoarthritis, Neuropathy Osteoarthritis, Neuropathy Osteoarthritis, Neuropathy 09/16/2022 09/16/2022 09/16/2022 Date Acquired: 0 0 0 Weeks of Treatment: Open Open Open Wound Status: No No No Wound Recurrence: 5.4x1x0.3 1.5x0.6x0.2 0.5x0.5x0.1 Measurements L x W x D (cm) 4.241 0.707 0.196 A (cm) : rea 1.272 0.141 0.02 Volume (cm) : Grade 1 Grade 1 Grade 1 Classification: Medium Medium None Present Exudate A mount: Serous Serous N/A Exudate Type: amber amber N/A Exudate Color: Yes Yes No Foul Odor A Cleansing: fter No No N/A Odor A nticipated Due to Product Use: Medium (34-66%) Medium (34-66%) Large (67-100%) Granulation A mount: Pink, Pale Pink, Pale N/A Granulation Quality: Medium (34-66%) Medium (34-66%) Small (1-33%) Necrotic A mount: Eschar, Adherent Slough Adherent Slough N/A Necrotic Tissue: Fat Layer (Subcutaneous Tissue): Yes Fat Layer (Subcutaneous Tissue): Yes N/A Exposed Structures: Fascia: No Fascia: No Tendon: No Tendon: No Muscle: No Muscle: No Joint: No Joint: No Bone: No Bone: No Debridement - Excisional Debridement - Selective/Open Wound  Debridement - Selective/Open Wound Debridement: Pre-procedure Verification/Time Out 10:36 10:36 10:36 JMARI, PELC ([id number]) [id number] Page 6 of 12 Taken: Lidocaine 5% topical ointment Lidocaine 5% topical ointment Lidocaine 5% topical ointment Pain Control: Subcutaneous, Slough Necrotic/Eschar, [employer]s Tissue Debrided: Skin/Subcutaneous Tissue Non-Viable Tissue Skin/Epidermis Level: 5.4 0.9 0.25 Debridement A (sq cm): rea Curette Curette Curette Instrument: Minimum Minimum Minimum Bleeding: Pressure Pressure Pressure Hemostasis A chieved: Procedure was tolerated well Procedure was tolerated well Procedure was tolerated well Debridement Treatment Response: 5.4x1x0.3 1.5x0.6x0.2 0.5x0.5x0.1 Post Debridement Measurements L x W x D (cm) 1.272 0.141 0.02 Post Debridement Volume: (cm) Scarring: Yes Scarring: Yes Callus: Yes Periwound Skin Texture: Dry/Scaly: Yes Dry/Scaly: Yes Dry/Scaly: Yes Periwound Skin Moisture: Maceration: No Maceration: No Rubor: Yes No Abnormalities Noted No Abnormalities Noted Periwound Skin Color: No Abnormality No Abnormality No Abnormality Temperature: Yes Yes N/A Tenderness on Palpation: Debridement Debridement Debridement Procedures Performed: Treatment Notes Electronic Signature(s) Signed: 11/07/2022 11:07:39 AM By: [name] MD FACS Previous Signature: 11/07/2022 10:31:23 AM Version By: [name] MD FACS Entered By: [name] on 11/07/2022 11:07:39 -------------------------------------------------------------------------------- Multi-Disciplinary Care Plan Details Patient Name: Date of Service: [name], [name] 11/07/2022 10:00 A M Medical Record Number: [id number] Patient Account Number: [id number] Date of Birth/Sex: Treating RN: [birth year] (67 y.o. Waldron Session Primary Care Anari Evitt: Kathyrn Drown Other Clinician: Referring Lovel Suazo: Treating Delano Frate/Extender:  Cherene Julian in Treatment: 0 Active Inactive Orientation to  the Wound Care Program Nursing Diagnoses: Knowledge deficit related to the wound healing center program Goals: Patient/caregiver will verbalize understanding of the [place] Program Date Initiated: 11/07/2022 Target Resolution Date: 12/30/2022 Goal Status: Active Interventions: Provide education on orientation to the wound center Notes: Pressure Nursing Diagnoses: Knowledge deficit related to causes and risk factors for pressure ulcer development Knowledge deficit related to management of pressures ulcers Goals: Patient will remain free of pressure ulcers Date Initiated: 11/07/2022 Target Resolution Date: 12/15/2022 Goal Status: Active Interventions: Assess: immobility, friction, shearing, incontinence upon admission and as needed Assess offloading mechanisms upon admission and as needed LIEV, BROCKBANK ([id number]) [phone].pdf Page 7 of 12 Assess potential for pressure ulcer upon admission and as needed Provide education on pressure ulcers Notes: Wound/Skin Impairment Nursing Diagnoses: Impaired tissue integrity Goals: Ulcer/skin breakdown will have a volume reduction of 30% by week 4 Date Initiated: 11/07/2022 Target Resolution Date: 12/15/2022 Goal Status: Active Interventions: Assess patient/caregiver ability to obtain necessary supplies Assess patient/caregiver ability to perform ulcer/skin care regimen upon admission and as needed Assess ulceration(s) every visit Treatment Activities: Skin care regimen initiated : 11/07/2022 Topical wound management initiated : 11/07/2022 Notes: Electronic Signature(s) Signed: 11/07/2022 4:52:32 PM By: Blanche East RN Entered By: Blanche East on 11/07/2022 10:45:07 -------------------------------------------------------------------------------- Pain Assessment Details Patient Name: Date of Service: [name] Setters D. 11/07/2022 10:00 A M Medical Record Number: [id number] Patient Account Number: [id number] Date of Birth/Sex: Treating RN: 02-05-1956 (67 y.o. Waldron Session Primary Care Japleen Tornow: Kathyrn Drown Other Clinician: Referring Umer Harig: Treating Gisel Vipond/Extender: Cherene Julian in Treatment: 0 Active Problems Location of Pain Severity and Description of Pain Patient Has Paino No Site Locations With Dressing Change: Yes Rate the pain. Current Pain Level: 0 Worst Pain Level: 5 Character of Pain Describe the Pain: Burning Pain Management and Medication Current Pain Management: [name], LEPPO ([id number]) [phone].pdf Page 8 of 12 Electronic Signature(s) Signed: 11/07/2022 4:52:32 PM By: Blanche East RN Entered By: Blanche East on 11/07/2022 10:27:48 -------------------------------------------------------------------------------- Patient/Caregiver Education Details Patient Name: Date of Service: [name], [name]. 1/22/2024andnbsp10:00 A M Medical Record Number: [id number] Patient Account Number: [id number] Date of Birth/Gender: Treating RN: Feb 12, 1956 (67 y.o. Waldron Session Primary Care Physician: Kathyrn Drown Other Clinician: Referring Physician: Treating Physician/Extender: Cherene Julian in Treatment: 0 Education Assessment Education Provided To: Patient Education Topics Provided Offloading: Handouts: How Offloading Helps Foot Wounds Heal Methods: Explain/Verbal Responses: Reinforcements needed, State content correctly Welcome T [facility]-New Patient Packet: o Handouts: Nutrition, The Wound Healing Pledge form, Welcome T The [place] o Methods: Explain/Verbal Responses: Reinforcements needed, State content correctly Wound Debridement: Handouts: Wound Debridement Methods: Explain/Verbal Responses: Reinforcements needed, State content  correctly Wound/Skin Impairment: Handouts: Caring for Your Ulcer Methods: Explain/Verbal Responses: Reinforcements needed, State content correctly Electronic Signature(s) Signed: 11/07/2022 4:52:32 PM By: Blanche East RN Entered By: Blanche East on 11/07/2022 10:52:39 -------------------------------------------------------------------------------- Wound Assessment Details Patient Name: Date of Service: [name] Setters D. 11/07/2022 10:00 A M Medical Record Number: [id number] Patient Account Number: [id number] Date of Birth/Sex: Treating RN: 05-01-1956 (67 y.o. Waldron Session Primary Care Merdis Snodgrass: Kathyrn Drown Other Clinician: Referring Gorden Stthomas: Treating Sallie Staron/Extender: Cherene Julian in Treatment: 0 Wound Status [name], [name] ([id number]) [id number] Page 9 of 12 Wound Number: 1 Primary Diabetic Wound/Ulcer of the [name] Extremity Etiology: Wound Location: Right, Lateral [name] Leg Wound Open Wounding Event: Surgical Injury Status: Date Acquired: 09/16/2022  Comorbid Congestive Heart Failure, Hypertension, Peripheral Venous Weeks Of Treatment: 0 History: Disease, Type II Diabetes, Osteoarthritis, Neuropathy Clustered Wound: No Photos Wound Measurements Length: (cm) 5.4 Width: (cm) 1 Depth: (cm) 0.3 Area: (cm) 4.241 Volume: (cm) 1.272 % Reduction in Area: % Reduction in Volume: Tunneling: No Undermining: No Wound Description Classification: Grade 1 Exudate Amount: Medium Exudate Type: Serous Exudate Color: amber Foul Odor After Cleansing: Yes Due to Product Use: No Slough/Fibrino Yes Wound Bed Granulation Amount: Medium (34-66%) Exposed Structure Granulation Quality: Pink, Pale Fascia Exposed: No Necrotic Amount: Medium (34-66%) Fat Layer (Subcutaneous Tissue) Exposed: Yes Necrotic Quality: Eschar, Adherent Slough Tendon Exposed: No Muscle Exposed: No Joint Exposed: No Bone Exposed: No Periwound  Skin Texture Texture Color No Abnormalities Noted: No No Abnormalities Noted: No Scarring: Yes Rubor: Yes Moisture Temperature / Pain No Abnormalities Noted: No Temperature: No Abnormality Dry / Scaly: Yes Tenderness on Palpation: Yes Electronic Signature(s) Signed: 11/07/2022 4:52:32 PM By: Blanche East RN Entered By: Blanche East on 11/07/2022 10:26:35 -------------------------------------------------------------------------------- Wound Assessment Details Patient Name: Date of Service: [name] Setters D. 11/07/2022 10:00 A M Medical Record Number: [id number] Patient Account Number: [id number] Date of Birth/Sex: Treating RN: 1956/09/11 (67 y.o. Waldron Session Primary Care Laurens Matheny: Kathyrn Drown Other Clinician: Referring Hunner Garcon: Treating Janalee Grobe/Extender: Cherene Julian in Treatment: 0 Wound Status [name], [name] ([id number]) [id number] Page 10 of 12 Wound Number: 2 Primary Diabetic Wound/Ulcer of the [name] Extremity Etiology: Wound Location: Right, Lateral Ankle Wound Open Wounding Event: Pressure Injury Status: Date Acquired: 09/16/2022 Comorbid Congestive Heart Failure, Hypertension, Peripheral Venous Weeks Of Treatment: 0 History: Disease, Type II Diabetes, Osteoarthritis, Neuropathy Clustered Wound: No Photos Wound Measurements Length: (cm) 1.5 Width: (cm) 0.6 Depth: (cm) 0.2 Area: (cm) 0.707 Volume: (cm) 0.141 % Reduction in Area: % Reduction in Volume: Tunneling: No Undermining: No Wound Description Classification: Grade 1 Exudate Amount: Medium Exudate Type: Serous Exudate Color: amber Foul Odor After Cleansing: Yes Due to Product Use: No Slough/Fibrino Yes Wound Bed Granulation Amount: Medium (34-66%) Exposed Structure Granulation Quality: Pink, Pale Fascia Exposed: No Necrotic Amount: Medium (34-66%) Fat Layer (Subcutaneous Tissue) Exposed: Yes Necrotic Quality: Adherent  Slough Tendon Exposed: No Muscle Exposed: No Joint Exposed: No Bone Exposed: No Periwound Skin Texture Texture Color No Abnormalities Noted: No No Abnormalities Noted: Yes Scarring: Yes Temperature / Pain Temperature: No Abnormality Moisture No Abnormalities Noted: No Tenderness on Palpation: Yes Dry / Scaly: Yes Maceration: No Electronic Signature(s) Signed: 11/07/2022 4:52:32 PM By: Blanche East RN Entered By: Blanche East on 11/07/2022 10:27:03 -------------------------------------------------------------------------------- Wound Assessment Details Patient Name: Date of Service: [name] Setters D. 11/07/2022 10:00 A M Medical Record Number: [id number] Patient Account Number: [id number] Date of Birth/Sex: Treating RN: [birth year] (67 y.o. Waldron Session Primary Care Damin Salido: Kathyrn Drown Other Clinician: Referring Khalin Royce: Treating Shiva Sahagian/Extender: Cherene Julian in Treatment: [address] VAIDEN, ADAMES ([id number]) [id number] Page 11 of 12 Wound Status Wound Number: 3 Primary Diabetic Wound/Ulcer of the [name] Extremity Etiology: Wound Location: Right Metatarsal head second Wound Open Wounding Event: Pressure Injury Status: Date Acquired: 09/16/2022 Comorbid Congestive Heart Failure, Hypertension, Peripheral Venous Weeks Of Treatment: 0 History: Disease, Type II Diabetes, Osteoarthritis, Neuropathy Clustered Wound: No Photos Wound Measurements Length: (cm) 0.5 Width: (cm) 0.5 Depth: (cm) 0.1 Area: (cm) 0.196 Volume: (cm) 0.02 % Reduction in Area: % Reduction in Volume: Tunneling: No Undermining: No Wound Description Classification: Grade 1 Exudate Amount: None Present Foul Odor After Cleansing: No Slough/Fibrino  No Wound Bed Granulation Amount: Large (67-100%) Necrotic Amount: Small (1-33%) Periwound Skin Texture Texture Color No Abnormalities Noted: No No Abnormalities Noted: Yes Callus: Yes  Temperature / Pain Temperature: No Abnormality Moisture No Abnormalities Noted: No Dry / Scaly: Yes Maceration: No Treatment Notes Wound #3 (Metatarsal head second) Wound Laterality: Right Cleanser Soap and Water Discharge Instruction: May shower and wash wound with dial antibacterial soap and water prior to dressing change. Wound Cleanser Discharge Instruction: Cleanse the wound with wound cleanser prior to applying a clean dressing using gauze sponges, not tissue or cotton balls. Peri-Wound Care Sween Lotion (Moisturizing lotion) Discharge Instruction: Apply moisturizing lotion as directed Topical Primary Dressing Sorbalgon AG Dressing 2x2 (in/in) Discharge Instruction: Apply to wound bed as instructed Secondary Dressing Optifoam Non-Adhesive Dressing, 4x4 in Discharge Instruction: Apply over primary dressing as directed. Zetuvit Plus 4x8 in Discharge Instruction: Apply over primary dressing as directed. ZOLLIE, ELLERY ([id number]) [id number] Page 12 of 12 Secured With Conforming Stretch Gauze Bandage, Sterile 2x75 (in/in) Discharge Instruction: Secure with stretch gauze as directed. Transpore Surgical Tape, 2x10 (in/yd) Discharge Instruction: Secure dressing with tape as directed. Compression Wrap ThreePress (3 layer compression wrap) Discharge Instruction: Apply three layer compression as directed. Compression Stockings Add-Ons Electronic Signature(s) Signed: 11/07/2022 4:52:32 PM By: Blanche East RN Entered By: Blanche East on 11/07/2022 10:27:25 -------------------------------------------------------------------------------- Vitals Details Patient Name: Date of Service: [name], [name] 11/07/2022 10:00 A M Medical Record Number: [id number] Patient Account Number: [id number] Date of Birth/Sex: Treating RN: 07/12/1956 (67 y.o. Waldron Session Primary Care Kellis Mcadam: Kathyrn Drown Other Clinician: Referring Lanitra Battaglini: Treating  Tamora Huneke/Extender: Cherene Julian in Treatment: 0 Vital Signs Time Taken: 09:52 Temperature (F): 98.1 Height (in): 74 Pulse (bpm): 91 Source: Stated Respiratory Rate (breaths/min): 20 Weight (lbs): 252 Blood Pressure (mmHg): 172/91 Source: Stated Capillary Blood Glucose (mg/dl): 200 Body Mass Index (BMI): 32.4 Reference Range: 80 - 120 mg / dl Electronic Signature(s) Signed: 11/07/2022 4:52:32 PM By: Blanche East RN Entered By: Blanche East on 11/07/2022 09:56:43

## 2022-11-07 ENCOUNTER — Encounter (HOSPITAL_BASED_OUTPATIENT_CLINIC_OR_DEPARTMENT_OTHER): Payer: Medicare HMO | Attending: Podiatry | Admitting: General Surgery

## 2022-11-07 DIAGNOSIS — I110 Hypertensive heart disease with heart failure: Secondary | ICD-10-CM | POA: Insufficient documentation

## 2022-11-07 DIAGNOSIS — E11621 Type 2 diabetes mellitus with foot ulcer: Secondary | ICD-10-CM | POA: Insufficient documentation

## 2022-11-07 DIAGNOSIS — L97512 Non-pressure chronic ulcer of other part of right foot with fat layer exposed: Secondary | ICD-10-CM | POA: Diagnosis not present

## 2022-11-07 DIAGNOSIS — I5022 Chronic systolic (congestive) heart failure: Secondary | ICD-10-CM | POA: Diagnosis not present

## 2022-11-07 DIAGNOSIS — I429 Cardiomyopathy, unspecified: Secondary | ICD-10-CM | POA: Diagnosis not present

## 2022-11-07 DIAGNOSIS — E1142 Type 2 diabetes mellitus with diabetic polyneuropathy: Secondary | ICD-10-CM | POA: Diagnosis not present

## 2022-11-07 DIAGNOSIS — E785 Hyperlipidemia, unspecified: Secondary | ICD-10-CM | POA: Diagnosis not present

## 2022-11-07 DIAGNOSIS — E1151 Type 2 diabetes mellitus with diabetic peripheral angiopathy without gangrene: Secondary | ICD-10-CM | POA: Diagnosis not present

## 2022-11-07 DIAGNOSIS — I2510 Atherosclerotic heart disease of native coronary artery without angina pectoris: Secondary | ICD-10-CM | POA: Insufficient documentation

## 2022-11-07 DIAGNOSIS — E11622 Type 2 diabetes mellitus with other skin ulcer: Secondary | ICD-10-CM | POA: Diagnosis not present

## 2022-11-07 DIAGNOSIS — L97312 Non-pressure chronic ulcer of right ankle with fat layer exposed: Secondary | ICD-10-CM | POA: Insufficient documentation

## 2022-11-07 DIAGNOSIS — L97812 Non-pressure chronic ulcer of other part of right lower leg with fat layer exposed: Secondary | ICD-10-CM | POA: Diagnosis not present

## 2022-11-07 NOTE — Progress Notes (Signed)
CHAS, AXEL ([id number]) [id number] Page 1 of 13 Visit Report for 11/07/2022 Chief Complaint Document Details Patient Name: Date of Service: [name] 11/07/2022 10:00 [place] Record Number: [id number] Patient Account Number: [id number] Date of Birth/Sex: Treating RN: 1956/04/26 (67 y.o. M) Primary Care Provider: Kathyrn Drown Other Clinician: Referring Provider: Treating Provider/Extender: Cherene Julian in Treatment: 0 Information Obtained from: Patient Chief Complaint Patients presents for treatment of an open diabetic ulcer on his right foot, as well as ulcers on his right lower leg and ankle Electronic Signature(s) Signed: 11/07/2022 11:07:51 AM By: [name] [name] FACS Previous Signature: 11/07/2022 10:32:14 AM Version By: [name] [name] FACS Entered By: [name] on 11/07/2022 11:07:51 -------------------------------------------------------------------------------- Debridement Details Patient Name: Date of Service: [name], [name] 11/07/2022 10:00 A M Medical Record Number: [id number] Patient Account Number: [id number] Date of Birth/Sex: Treating RN: 1955-12-22 (67 y.o. Waldron Session Primary Care Provider: Kathyrn Drown Other Clinician: Referring Provider: Treating Provider/Extender: Cherene Julian in Treatment: 0 Debridement Performed for Assessment: Wound #2 Right,Lateral Ankle Performed By: Physician [name], [name] Debridement Type: Debridement Severity of Tissue Pre Debridement: Fat layer exposed Level of Consciousness (Pre-procedure): Awake and Alert Pre-procedure Verification/Time Out Yes - 10:36 Taken: Start Time: 10:37 Pain Control: Lidocaine 5% topical ointment T Area Debrided (L x W): otal 1.5 (cm) x 0.6 (cm) = 0.9 (cm) Tissue and other material debrided: Non-Viable, Eschar, Slough, Slough Level: Non-Viable Tissue Debridement  Description: Selective/Open Wound Instrument: Curette Bleeding: Minimum Hemostasis Achieved: Pressure Response to Treatment: Procedure was tolerated well Level of Consciousness (Post- Awake and Alert procedure): Post Debridement Measurements of Total Wound Length: (cm) 1.5 Width: (cm) 0.6 Depth: (cm) 0.2 Volume: (cm) 0.141 Character of Wound/Ulcer Post Debridement: Requires Further Debridement Severity of Tissue Post Debridement: Fat layer exposed Post Procedure Diagnosis GLEN, [name] ([id number]) [id number] Page 2 of 13 Same as Pre-procedure Notes Scribed for Dr. Celine Ahr by Blanche East, RN Electronic Signature(s) Signed: 11/07/2022 11:23:06 AM By: [name] [name] FACS Signed: 11/07/2022 4:52:32 PM By: Blanche East RN Entered By: Blanche East on 11/07/2022 10:38:57 -------------------------------------------------------------------------------- Debridement Details Patient Name: Date of Service: [name], [name] 11/07/2022 10:00 [place] Record Number: [id number] Patient Account Number: [id number] Date of Birth/Sex: Treating RN: May 09, 1956 (67 y.o. Waldron Session Primary Care Provider: Kathyrn Drown Other Clinician: Referring Provider: Treating Provider/Extender: Cherene Julian in Treatment: 0 Debridement Performed for Assessment: Wound #1 Right,Lateral Lower Leg Performed By: Physician [name], [name] Debridement Type: Debridement Severity of Tissue Pre Debridement: Fat layer exposed Level of Consciousness (Pre-procedure): Awake and Alert Pre-procedure Verification/Time Out Yes - 10:36 Taken: Start Time: 10:37 Pain Control: Lidocaine 5% topical ointment T Area Debrided (L x W): otal 5.4 (cm) x 1 (cm) = 5.4 (cm) Tissue and other material debrided: Viable, Non-Viable, Slough, Subcutaneous, Slough Level: Skin/Subcutaneous Tissue Debridement Description: Excisional Instrument: Curette Bleeding:  Minimum Hemostasis Achieved: Pressure Response to Treatment: Procedure was tolerated well Level of Consciousness (Post- Awake and Alert procedure): Post Debridement Measurements of Total Wound Length: (cm) 5.4 Width: (cm) 1 Depth: (cm) 0.3 Volume: (cm) 1.272 Character of Wound/Ulcer Post Debridement: Requires Further Debridement Severity of Tissue Post Debridement: Fat layer exposed Post Procedure Diagnosis Same as Pre-procedure Notes Scribed for Dr. Celine Ahr by Blanche East, RN Electronic Signature(s) Signed: 11/07/2022 11:23:06 AM By: [name] [name] FACS Signed: 11/07/2022 4:52:32 PM By: Blanche East RN Entered By: Blanche East  on 11/07/2022 10:39:40 Debridement Details -------------------------------------------------------------------------------- [name] ([id number]) [id number] Page 3 of 13 Patient Name: Date of Service: [name], [name] 11/07/2022 10:00 A M Medical Record Number: [id number] Patient Account Number: [id number] Date of Birth/Sex: Treating RN: [birth year] (67 y.o. Waldron Session Primary Care Provider: Kathyrn Drown Other Clinician: Referring Provider: Treating Provider/Extender: Cherene Julian in Treatment: 0 Debridement Performed for Assessment: Wound #3 Right Metatarsal head second Performed By: Physician [name], [name] Debridement Type: Debridement Severity of Tissue Pre Debridement: Fat layer exposed Level of Consciousness (Pre-procedure): Awake and Alert Pre-procedure Verification/Time Out Yes - 10:36 Taken: Start Time: 10:37 Pain Control: Lidocaine 5% topical ointment T Area Debrided (L x W): otal 0.5 (cm) x 0.5 (cm) = 0.25 (cm) Tissue and other material debrided: Non-Viable, Callus, Skin: Epidermis Level: Skin/Epidermis Debridement Description: Selective/Open Wound Instrument: Curette Bleeding: Minimum Hemostasis Achieved: Pressure Response to Treatment: Procedure  was tolerated well Level of Consciousness (Post- Awake and Alert procedure): Post Debridement Measurements of Total Wound Length: (cm) 0.5 Width: (cm) 0.5 Depth: (cm) 0.1 Volume: (cm) 0.02 Character of Wound/Ulcer Post Debridement: Requires Further Debridement Severity of Tissue Post Debridement: Fat layer exposed Post Procedure Diagnosis Same as Pre-procedure Notes Scribed for Dr. Celine Ahr by Blanche East, RN Electronic Signature(s) Signed: 11/07/2022 11:23:06 AM By: [name] [name] FACS Signed: 11/07/2022 4:52:32 PM By: Blanche East RN Entered By: Blanche East on 11/07/2022 10:47:37 -------------------------------------------------------------------------------- HPI Details Patient Name: Date of Service: [name], [name] 11/07/2022 10:00 A M Medical Record Number: [id number] Patient Account Number: [id number] Date of Birth/Sex: Treating RN: 19-Sep-1956 (67 y.o. 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 He has a remote history of surgery on his right ankle and apparently as his legs have become increasingly more swollen and he has insisted upon wearing boots, the pressure and friction seem to have opened up wounds along the old scar line. In addition, he has a large callus on the third metatarsal head, related to his acquired foot deformity. He sees a podiatrist who periodically shaves this callus down and he was also recommended orthotics, but the patient has refused them because he likes  his boots. There is an area of discoloration in the lower portion of this callus, suspicious for an open wound. Electronic Signature(s) ISMAR, YABUT ([id number]) [id number] Page 4 of 13 Signed: 11/07/2022 11:12:01 AM By: [name] [name] FACS Entered By: [name] on 11/07/2022 11:12:00 -------------------------------------------------------------------------------- Physical Exam Details Patient Name: Date of Service: [name], [name] 11/07/2022 10:00 A M Medical Record Number: [id number] Patient Account Number: [id number] Date of Birth/Sex: Treating RN: October 17, 1956 (67 y.o. M) Primary Care Provider: Kathyrn Drown Other Clinician: Referring Provider: Treating Provider/Extender: Cherene Julian in Treatment: 0 Constitutional Hypertensive, asymptomatic. . . . No acute distress. Respiratory Normal work of breathing on room air. Cardiovascular . 3+ pitting edema. Notes 11/07/2022: On his right lateral lower leg and right lateral ankle, there are 2 wounds that follows the contour of an old surgical incision. There is slough and necrotic tissue present in the leg wound and  slough and eschar in the ankle wound. There is a narrow slit of a wound on the plantar surface of his third metatarsal head underlying the thick callus that is present. Electronic Signature(s) Signed: 11/07/2022 11:14:38 AM By: [name] [name] FACS Entered By: [name] on 11/07/2022 11:14:38 -------------------------------------------------------------------------------- Physician Orders Details Patient Name: Date of Service: [name], [name] 11/07/2022 10:00 A M Medical Record Number: [id number] Patient Account Number: [id number] Date of Birth/Sex: Treating RN: Oct 30, 1955 (67 y.o. Waldron Session Primary Care Provider: Kathyrn Drown Other Clinician: Referring Provider: Treating Provider/Extender: Cherene Julian in Treatment: 0 Verbal / Phone Orders: No Diagnosis Coding ICD-10 Coding Code Description L97.819 Non-pressure chronic ulcer of other part of right lower leg with unspecified severity L97.512 Non-pressure chronic ulcer of other part of right foot with fat layer exposed L97.312 Non-pressure chronic ulcer of right ankle with fat layer exposed E11.622 Type 2 diabetes mellitus with other skin ulcer E11.621 Type 2 diabetes mellitus with foot ulcer Q03.47 Chronic systolic (congestive) heart failure I73.9 Peripheral vascular disease, unspecified E11.42 Type 2 diabetes mellitus with diabetic polyneuropathy I63.9 Cerebral infarction, unspecified I10 Essential (primary) hypertension G89.4 Chronic pain syndrome IZAIAS, KRUPKA ([id number]) [id number] Page 5 of 13 Follow-up Appointments ppointment in 1 week. - Dr. Celine Ahr Rm 4 Return A Anesthetic Wound #1 Right,Lateral Lower Leg (In clinic) Topical Lidocaine 5% applied to wound bed - Prior to debridement Wound #2 Right,Lateral Ankle (In clinic) Topical Lidocaine 5% applied to wound bed - Prior to debridement Wound #3 Right Metatarsal head second (In clinic) Topical Lidocaine 5% applied to wound bed - Prior to debridement Bathing/ Shower/ Hygiene May shower with protection but do not get wound dressing(s) wet. Protect dressing(s) with water repellant cover (for example, large plastic bag) or a cast cover and may then take shower. - May purchase cast protector at [facility], [facility], or [facility] Edema Control - Lymphedema / SCD / Other Right Lower Extremity Elevate legs to the level of the heart or above for 30 minutes daily and/or when sitting for 3-4 times a day throughout the day. Avoid standing for long periods of time. Wound Treatment Wound #1 - Lower Leg Wound Laterality: Right, Lateral Cleanser: Soap and Water Discharge Instructions: May shower and wash wound with dial antibacterial soap and water  prior to dressing change. Cleanser: Wound Cleanser Discharge Instructions: Cleanse the wound with wound cleanser prior to applying a clean dressing using gauze sponges, not tissue or cotton balls. Peri-Wound Care: Sween Lotion (Moisturizing lotion) Discharge Instructions: Apply moisturizing lotion as directed Prim Dressing: Abram Dressing 2x2 (in/in) ary Discharge Instructions: Apply to wound bed as instructed Secondary Dressing: Zetuvit Plus 4x8 in Discharge Instructions: Apply over primary dressing as directed. Secured With: Child psychotherapist, Sterile 2x75 (in/in) Discharge Instructions: Secure with stretch gauze as directed. Secured With: Transpore Surgical Tape, 2x10 (in/yd) Discharge Instructions: Secure dressing with tape as directed. Compression Wrap: ThreePress (3 layer compression wrap) Discharge Instructions: Apply three layer compression as directed. Wound #2 - Ankle Wound Laterality: Right, Lateral Cleanser: Soap and Water Discharge Instructions: May shower and wash wound with dial antibacterial soap and water prior to dressing change. Cleanser: Wound Cleanser Discharge Instructions: Cleanse the wound with wound cleanser prior to applying a clean dressing using gauze sponges, not tissue or cotton balls. Peri-Wound Care: Sween Lotion (Moisturizing lotion) Discharge Instructions: Apply moisturizing lotion as directed Prim Dressing: [place] Dressing 2x2 (in/in) ary Discharge Instructions: Apply to wound bed as instructed  Secondary Dressing: Zetuvit Plus 4x8 in Discharge Instructions: Apply over primary dressing as directed. Secured With: Child psychotherapist, Sterile 2x75 (in/in) Discharge Instructions: Secure with stretch gauze as directed. Secured With: Transpore Surgical Tape, 2x10 (in/yd) Discharge Instructions: Secure dressing with tape as directed. Compression Wrap: ThreePress (3 layer compression wrap) Discharge Instructions:  Apply three layer compression as directed. Wound #3 - Metatarsal head second Wound Laterality: Right Cleanser: Soap and Water Discharge Instructions: May shower and wash wound with dial antibacterial soap and water prior to dressing change. SHAFT, CORIGLIANO ([id number]) [id number] Page 6 of 13 Cleanser: Wound Cleanser Discharge Instructions: Cleanse the wound with wound cleanser prior to applying a clean dressing using gauze sponges, not tissue or cotton balls. Peri-Wound Care: Sween Lotion (Moisturizing lotion) Discharge Instructions: Apply moisturizing lotion as directed Prim Dressing: [place] Dressing 2x2 (in/in) ary Discharge Instructions: Apply to wound bed as instructed Secondary Dressing: Optifoam Non-Adhesive Dressing, 4x4 in Discharge Instructions: Apply over primary dressing as directed. Secondary Dressing: Zetuvit Plus 4x8 in Discharge Instructions: Apply over primary dressing as directed. Secured With: Child psychotherapist, Sterile 2x75 (in/in) Discharge Instructions: Secure with stretch gauze as directed. Secured With: Transpore Surgical Tape, 2x10 (in/yd) Discharge Instructions: Secure dressing with tape as directed. Compression Wrap: ThreePress (3 layer compression wrap) Discharge Instructions: Apply three layer compression as directed. Electronic Signature(s) Signed: 11/07/2022 12:38:37 PM By: [name] [name] FACS Signed: 11/07/2022 4:52:32 PM By: Blanche East RN Previous Signature: 11/07/2022 11:23:06 AM Version By: [name] [name] FACS Entered By: Blanche East on 11/07/2022 11:34:02 -------------------------------------------------------------------------------- Problem List Details Patient Name: Date of Service: [name], [name] 11/07/2022 10:00 A M Medical Record Number: [id number] Patient Account Number: [id number] Date of Birth/Sex: Treating RN: 1956/07/31 (67 y.o. M) Primary Care Provider: Kathyrn Drown Other  Clinician: Referring Provider: Treating Provider/Extender: Cherene Julian in Treatment: 0 Active Problems ICD-10 Encounter Code Description Active Date MDM Diagnosis L97.819 Non-pressure chronic ulcer of other part of right lower leg with unspecified 11/07/2022 No Yes severity L97.512 Non-pressure chronic ulcer of other part of right foot with fat layer exposed 11/07/2022 No Yes L97.312 Non-pressure chronic ulcer of right ankle with fat layer exposed 11/07/2022 No Yes E11.622 Type 2 diabetes mellitus with other skin ulcer 11/07/2022 No Yes E11.621 Type 2 diabetes mellitus with foot ulcer 11/07/2022 No Yes F09.32 Chronic systolic (congestive) heart failure 11/07/2022 No Yes DOYCE, SALING ([id number]) [id number] Page 7 of 13 I73.9 Peripheral vascular disease, unspecified 11/07/2022 No Yes E11.42 Type 2 diabetes mellitus with diabetic polyneuropathy 11/07/2022 No Yes I63.9 Cerebral infarction, unspecified 11/07/2022 No Yes I10 Essential (primary) hypertension 11/07/2022 No Yes G89.4 Chronic pain syndrome 11/07/2022 No Yes Inactive Problems Resolved Problems Electronic Signature(s) Signed: 11/07/2022 10:30:01 AM By: [name] [name] FACS Previous Signature: 11/07/2022 10:27:47 AM Version By: [name] [name] FACS Entered By: [name] on 11/07/2022 10:30:01 -------------------------------------------------------------------------------- Progress Note Details Patient Name: Date of Service: [name], [name] 11/07/2022 10:00 A M Medical Record Number: [id number] Patient Account Number: [id number] Date of Birth/Sex: Treating RN: 10-21-1955 (67 y.o. M) Primary Care Provider: Kathyrn Drown Other Clinician: Referring Provider: Treating Provider/Extender: Cherene Julian in Treatment: 0 Subjective Chief Complaint Information obtained from Patient Patients presents for treatment of an open diabetic  ulcer on his right foot, as well as ulcers on his right lower leg and ankle History of Present Illness (HPI) ADMISSION 11/07/2022 This is a 67 year old man with a past medical history  significant for type 2 diabetes (last hemoglobin A1c 7.1), cerebrovascular occlusive disease, coronary artery disease, peripheral vascular disease, congestive heart failure, chronic pain syndrome, and prior amputation of his right great and second toes. He has a remote history of surgery on his right ankle and apparently as his legs have become increasingly more swollen and he has insisted upon wearing boots, the pressure and friction seem to have opened up wounds along the old scar line. In addition, he has a large callus on the third metatarsal head, related to his acquired foot deformity. He sees a podiatrist who periodically shaves this callus down and he was also recommended orthotics, but the patient has refused them because he likes his boots. There is an area of discoloration in the lower portion of this callus, suspicious for an open wound. Patient History Allergies metformin, Statins-HMG-CoA Reductase Inhibitors, Vicodin Family History Cancer - Father,Mother,Siblings, Diabetes - Mother,Siblings, Heart Disease, Lung Disease - Father, No family history of Hereditary Spherocytosis, Hypertension, Kidney Disease, Seizures, Stroke, Thyroid Problems, Tuberculosis. Social History Former smoker - ended on 12/23/1970, Marital Status - Married, Alcohol Use - Never, Drug Use - No History, Caffeine Use - Daily. ANAV, LAMMERT ([id number]) [id number] Page 8 of 13 Medical History Eyes Denies history of Cataracts, Glaucoma, Optic Neuritis Ear/Nose/Mouth/Throat Denies history of Chronic sinus problems/congestion, Middle ear problems Respiratory Denies history of Aspiration, Asthma, Chronic Obstructive Pulmonary Disease (COPD), Pneumothorax, Sleep Apnea, Tuberculosis Cardiovascular Patient  has history of Congestive Heart Failure, Hypertension, Peripheral Venous Disease Endocrine Patient has history of Type II Diabetes Musculoskeletal Patient has history of Osteoarthritis Neurologic Patient has history of Neuropathy Oncologic Denies history of Received Chemotherapy, Received Radiation Psychiatric Denies history of Anorexia/bulimia, Confinement Anxiety Patient is treated with Insulin, Oral Agents. Medical A Surgical History Notes nd Cardiovascular cardiomyopathy hyperlipidemia hypokalemia stroke Genitourinary orchitis Review of Systems (ROS) Constitutional Symptoms (General Health) Denies complaints or symptoms of Fatigue, Fever, Chills, Marked Weight Change. Eyes Complains or has symptoms of Glasses / Contacts - glasses-near sighted. Denies complaints or symptoms of Vision Changes. Ear/Nose/Mouth/Throat Denies complaints or symptoms of Chronic sinus problems or rhinitis. Respiratory Denies complaints or symptoms of Chronic or frequent coughs, Shortness of Breath. Cardiovascular Denies complaints or symptoms of Chest pain. Gastrointestinal Denies complaints or symptoms of Frequent diarrhea, Nausea, Vomiting. Genitourinary Complains or has symptoms of Frequent urination. Integumentary (Skin) Denies complaints or symptoms of Wounds. Musculoskeletal Complains or has symptoms of Muscle Weakness. Denies complaints or symptoms of Muscle Pain. Psychiatric Denies complaints or symptoms of Claustrophobia. Objective Constitutional Hypertensive, asymptomatic. No acute distress. Vitals Time Taken: 9:52 AM, Height: 74 in, Source: Stated, Weight: 252 lbs, Source: Stated, BMI: 32.4, Temperature: 98.1 F, Pulse: 91 bpm, Respiratory Rate: 20 breaths/min, Blood Pressure: 172/91 mmHg, Capillary Blood Glucose: 200 mg/dl. Respiratory Normal work of breathing on room air. Cardiovascular 3+ pitting edema. General Notes: 11/07/2022: On his right lateral lower leg and right  lateral ankle, there are 2 wounds that follows the contour of an old surgical incision. There is slough and necrotic tissue present in the leg wound and slough and eschar in the ankle wound. There is a narrow slit of a wound on the plantar surface of his third metatarsal head underlying the thick callus that is present. Integumentary (Hair, Skin) Wound #1 status is Open. Original cause of wound was Surgical Injury. The date acquired was: 09/16/2022. The wound is located on the Right,Lateral Lower Leg. The wound measures 5.4cm length x 1cm width x 0.3cm depth; 4.241cm^2 area and 1.272cm^3  volume. There is Fat Layer (Subcutaneous Tissue) exposed. There is no tunneling or undermining noted. There is a medium amount of serous drainage noted. Foul odor after cleansing was noted. There is medium (34- 66%) pink, pale granulation within the wound bed. There is a medium (34-66%) amount of necrotic tissue within the wound bed including Eschar and Adherent Slough. The periwound skin appearance exhibited: Scarring, Dry/Scaly, Rubor. Periwound temperature was noted as No Abnormality. The periwound has tenderness on palpation. Wound #2 status is Open. Original cause of wound was Pressure Injury. The date acquired was: 09/16/2022. The wound is located on the Right,Lateral Ankle. The wound measures 1.5cm length x 0.6cm width x 0.2cm depth; 0.707cm^2 area and 0.141cm^3 volume. There is Fat Layer (Subcutaneous Tissue) exposed. There is no tunneling or undermining noted. There is a medium amount of serous drainage noted. Foul odor after cleansing was noted. There is medium (34- 66%) pink, pale granulation within the wound bed. There is a medium (34-66%) amount of necrotic tissue within the wound bed including Adherent Slough. The JAWON, DIPIERO ([id number]) [id number] Page 9 of 13 periwound skin appearance had no abnormalities noted for color. The periwound skin appearance exhibited: Scarring,  Dry/Scaly. The periwound skin appearance did not exhibit: Maceration. Periwound temperature was noted as No Abnormality. The periwound has tenderness on palpation. Wound #3 status is Open. Original cause of wound was Pressure Injury. The date acquired was: 09/16/2022. The wound is located on the Right Metatarsal head second. The wound measures 0.5cm length x 0.5cm width x 0.1cm depth; 0.196cm^2 area and 0.02cm^3 volume. There is no tunneling or undermining noted. There is a none present amount of drainage noted. There is large (67-100%) granulation within the wound bed. There is a small (1-33%) amount of necrotic tissue within the wound bed. The periwound skin appearance had no abnormalities noted for color. The periwound skin appearance exhibited: Callus, Dry/Scaly. The periwound skin appearance did not exhibit: Maceration. Periwound temperature was noted as No Abnormality. 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 There was a Excisional Skin/Subcutaneous Tissue Debridement with a total area of 5.4 sq cm performed by [name], [name]. With the following instrument(s): Curette to remove Viable and Non-Viable tissue/material. Material removed includes Subcutaneous  Tissue and Slough and after achieving pain control using Lidocaine 5% topical ointment. A time out was conducted at 10:36, prior to the start of the procedure. A Minimum amount of bleeding was controlled with Pressure. The procedure was tolerated well. Post Debridement Measurements: 5.4cm length x 1cm width x 0.3cm depth; 1.272cm^3 volume. Character of Wound/Ulcer Post Debridement requires further debridement. Severity of Tissue Post Debridement is: Fat layer exposed. Post procedure Diagnosis Wound #1: Same as Pre-Procedure General Notes: Scribed for Dr. Celine Ahr by Blanche East, RN. Pre-procedure diagnosis of Wound #1 is a Diabetic Wound/Ulcer of the Lower Extremity located on the Right,Lateral Lower Leg . There was a Three  Layer Compression Therapy Procedure by Blanche East, RN. Post procedure Diagnosis Wound #1: Same as Pre-Procedure Wound #2 Pre-procedure diagnosis of Wound #2 is a Diabetic Wound/Ulcer of the Lower Extremity located on the Right,Lateral Ankle .Severity of Tissue Pre Debridement is: Fat layer exposed. There was a Selective/Open Wound Non-Viable Tissue Debridement with a total area of 0.9 sq cm performed by [name], [name]. With the following instrument(s): Curette to remove Non-Viable tissue/material. Material removed includes Eschar and Slough and after achieving pain control using Lidocaine 5% topical ointment. A time out was conducted at 10:36, prior to the start of the procedure. A Minimum amount of bleeding was controlled with Pressure. The procedure was tolerated well. Post Debridement Measurements: 1.5cm length x 0.6cm width x 0.2cm depth; 0.141cm^3 volume. Character of Wound/Ulcer Post Debridement requires further debridement. Severity of Tissue Post Debridement is: Fat layer exposed. Post procedure Diagnosis Wound #2: Same as Pre-Procedure General Notes: Scribed for Dr. Celine Ahr by Blanche East, RN. Pre-procedure diagnosis of Wound #2 is a Diabetic Wound/Ulcer of the  Lower Extremity located on the Right,Lateral Ankle . There was a Three Layer Compression Therapy Procedure by Blanche East, RN. Post procedure Diagnosis Wound #2: Same as Pre-Procedure Wound #3 Pre-procedure diagnosis of Wound #3 is a Diabetic Wound/Ulcer of the Lower Extremity located on the Right Metatarsal head second .Severity of Tissue Pre Debridement is: Fat layer exposed. There was a Selective/Open Wound Skin/Epidermis Debridement with a total area of 0.25 sq cm performed by [name], [name]. With the following instrument(s): Curette to remove Non-Viable tissue/material. Material removed includes Callus and Skin: Epidermis and after achieving pain control using Lidocaine 5% topical ointment. A time out was conducted at 10:36, prior to the start of the procedure. A Minimum amount of bleeding was controlled with Pressure. The procedure was tolerated well. Post Debridement Measurements: 0.5cm length x 0.5cm width x 0.1cm depth; 0.02cm^3 volume. Character of Wound/Ulcer Post Debridement requires further debridement. Severity of Tissue Post Debridement is: Fat layer exposed. Post procedure Diagnosis Wound #3: Same as Pre-Procedure General Notes: Scribed for Dr. Celine Ahr by Blanche East, RN. Pre-procedure diagnosis of Wound #3 is a Diabetic Wound/Ulcer of the Lower Extremity located on the Right Metatarsal head second . There was a Three Layer Compression Therapy Procedure by Blanche East, RN. Post procedure Diagnosis Wound #3: Same as Pre-Procedure Plan [name], [name] ([id number]) [id number] Page 10 of 13 Follow-up Appointments: Return Appointment in 1 week. - Dr. Celine Ahr Rm 4 Anesthetic: Wound #1 Right,Lateral Lower Leg: (In clinic) Topical Lidocaine 5% applied to wound bed - Prior to debridement Wound #2 Right,Lateral Ankle: (In clinic) Topical Lidocaine 5% applied to wound bed - Prior to debridement Wound #3 Right Metatarsal head second: (In clinic)  Topical Lidocaine 5% applied to wound bed - Prior to debridement Bathing/ Shower/ Hygiene: May shower with protection but do not get wound dressing(s) wet. Protect dressing(s) with water repellant cover (for example, large plastic bag) or a cast cover and may then take shower. - May purchase cast protector at [facility], [facility], or [facility] Edema Control - Lymphedema / SCD / Other: Elevate legs to the level of the heart or above for 30 minutes daily and/or when sitting for 3-4 times a day throughout the day. Avoid standing for long periods of time. WOUND #1: - Lower Leg Wound Laterality: Right, Lateral Cleanser: Soap and Water Discharge Instructions: May shower and wash wound with dial antibacterial soap and water prior to dressing change. Cleanser: Wound Cleanser Discharge Instructions: Cleanse the  wound with wound cleanser prior to applying a clean dressing using gauze sponges, not tissue or cotton balls. Peri-Wound Care: Sween Lotion (Moisturizing lotion) Discharge Instructions: Apply moisturizing lotion as directed Prim Dressing: [place] Dressing 2x2 (in/in) ary Discharge Instructions: Apply to wound bed as instructed Secondary Dressing: Zetuvit Plus 4x8 in Discharge Instructions: Apply over primary dressing as directed. Secured With: Child psychotherapist, Sterile 2x75 (in/in) Discharge Instructions: Secure with stretch gauze as directed. Secured With: Transpore Surgical T ape, 2x10 (in/yd) Discharge Instructions: Secure dressing with tape as directed. Com pression Wrap: ThreePress (3 layer compression wrap) Discharge Instructions: Apply three layer compression as directed. WOUND #2: - Ankle Wound Laterality: Right, Lateral Cleanser: Soap and Water Discharge Instructions: May shower and wash wound with dial antibacterial soap and water prior to dressing change. Cleanser: Wound Cleanser Discharge Instructions: Cleanse the wound with wound cleanser prior to applying a clean  dressing using gauze sponges, not tissue or cotton balls. Peri-Wound Care: Sween Lotion (Moisturizing lotion) Discharge Instructions: Apply moisturizing lotion as directed Prim Dressing: [place] Dressing 2x2 (in/in) ary Discharge Instructions: Apply to wound bed as instructed Secondary Dressing: Zetuvit Plus 4x8 in Discharge Instructions: Apply over primary dressing as directed. Secured With: Child psychotherapist, Sterile 2x75 (in/in) Discharge Instructions: Secure with stretch gauze as directed. Secured With: Transpore Surgical T ape, 2x10 (in/yd) Discharge Instructions: Secure dressing with tape as directed. Com pression Wrap: ThreePress (3 layer compression wrap) Discharge Instructions: Apply three layer compression as directed. WOUND #3: - Metatarsal head second Wound Laterality: Right Cleanser: Soap and Water Discharge Instructions: May shower and wash wound with dial antibacterial soap and water prior to dressing change. Cleanser: Wound Cleanser Discharge Instructions: Cleanse the wound with wound cleanser prior to applying a clean dressing using gauze sponges, not tissue or cotton balls. Peri-Wound Care: Sween Lotion (Moisturizing lotion) Discharge Instructions: Apply moisturizing lotion as directed Prim Dressing: [place] Dressing 2x2 (in/in) ary Discharge Instructions: Apply to wound bed as instructed Secondary Dressing: Optifoam Non-Adhesive Dressing, 4x4 in Discharge Instructions: Apply over primary dressing as directed. Secondary Dressing: Zetuvit Plus 4x8 in Discharge Instructions: Apply over primary dressing as directed. Secured With: Child psychotherapist, Sterile 2x75 (in/in) Discharge Instructions: Secure with stretch gauze as directed. Secured With: Transpore Surgical T ape, 2x10 (in/yd) Discharge Instructions: Secure dressing with tape as directed. Com pression Wrap: ThreePress (3 layer compression wrap) Discharge Instructions: Apply  three layer compression as directed. 11/07/2022: On his right lateral lower leg and right lateral ankle, there are 2 wounds that follows the contour of an old surgical incision. There is slough and necrotic tissue present in the leg wound and slough and eschar in the ankle wound. There is a narrow slit of a wound on the plantar surface of his third metatarsal head underlying the thick callus that is present. I used a curette to debride slough and eschar from the ankle wound, slough and nonviable subcutaneous tissue from the leg wound and callus and slough from the plantar foot wound. I am going to use silver alginate to all sites and 3 layer compression. I encouraged him to speak with his primary care doctor regarding his Lasix. Apparently he is only taking 20 mg on a as needed basis; I think he could benefit from daily dosing and likely an increase to 40 mg based on the severity of his peripheral edema. Follow-up in 1 week. Electronic Signature(s) Signed: 11/07/2022 12:32:39 PM By: Blanche East RN Signed: 11/07/2022 12:38:37 PM By: Celine Ahr  [name] FACS Previous Signature: 11/07/2022 11:21:43 AM Version By: [name] [name] FACS Entered By: Blanche East on 11/07/2022 12:32:39 [name] ([id number]) [id number] Page 11 of 13 -------------------------------------------------------------------------------- HxROS Details Patient Name: Date of Service: [name], [name] 11/07/2022 10:00 A M Medical Record Number: [id number] Patient Account Number: [id number] Date of Birth/Sex: Treating RN: [birth year] (67 y.o. Waldron Session Primary Care Provider: Kathyrn Drown Other Clinician: Referring Provider: Treating Provider/Extender: Cherene Julian in Treatment: 0 Constitutional Symptoms (General Health) Complaints and Symptoms: Negative for: Fatigue; Fever; Chills; Marked Weight Change Eyes Complaints and Symptoms: Positive for:  Glasses / Contacts - glasses-near sighted Negative for: Vision Changes Medical History: Negative for: Cataracts; Glaucoma; Optic Neuritis Ear/Nose/Mouth/Throat Complaints and Symptoms: Negative for: Chronic sinus problems or rhinitis Medical History: Negative for: Chronic sinus problems/congestion; Middle ear problems Respiratory Complaints and Symptoms: Negative for: Chronic or frequent coughs; Shortness of Breath Medical History: Negative for: Aspiration; Asthma; Chronic Obstructive Pulmonary Disease (COPD); Pneumothorax; Sleep Apnea; Tuberculosis Cardiovascular Complaints and Symptoms: Negative for: Chest pain Medical History: Positive for: Congestive Heart Failure; Hypertension; Peripheral Venous Disease Past Medical History Notes: cardiomyopathy hyperlipidemia hypokalemia stroke Gastrointestinal Complaints and Symptoms: Negative for: Frequent diarrhea; Nausea; Vomiting Genitourinary Complaints and Symptoms: Positive for: Frequent urination Medical History: Past Medical History Notes: orchitis Integumentary (Skin) Complaints and Symptoms: Negative for: Wounds Musculoskeletal JUNE, VACHA ([id number]) [id number] Page 12 of 13 Complaints and Symptoms: Positive for: Muscle Weakness Negative for: Muscle Pain Medical History: Positive for: Osteoarthritis Psychiatric Complaints and Symptoms: Negative for: Claustrophobia Medical History: Negative for: Anorexia/bulimia; Confinement Anxiety Hematologic/Lymphatic Endocrine Medical History: Positive for: Type II Diabetes Time with diabetes: 20 years Treated with: Insulin, Oral agents Immunological Neurologic Medical History: Positive for: Neuropathy Oncologic Medical History: Negative for: Received Chemotherapy; Received Radiation Immunizations Pneumococcal Vaccine: Received Pneumococcal Vaccination: Yes Received Pneumococcal Vaccination On or After 60th Birthday:  Yes Implantable Devices No devices added Family and Social History Cancer: Yes - Father,Mother,Siblings; Diabetes: Yes - Mother,Siblings; Heart Disease: Yes; Hereditary Spherocytosis: No; Hypertension: No; Kidney Disease: No; Lung Disease: Yes - Father; Seizures: No; Stroke: No; Thyroid Problems: No; Tuberculosis: No; Former smoker - ended on 12/23/1970; Marital Status - Married; Alcohol Use: Never; Drug Use: No History; Caffeine Use: Daily; Financial Concerns: No; Food, Clothing or Shelter Needs: No; Support System Lacking: No; Transportation Concerns: No Physician Affirmation I have reviewed and agree with the above information. Electronic Signature(s) Signed: 11/07/2022 11:23:06 AM By: [name] [name] FACS Signed: 11/07/2022 4:52:32 PM By: Blanche East RN Entered By: Blanche East on 11/07/2022 10:02:53 -------------------------------------------------------------------------------- SuperBill Details Patient Name: Date of Service: [name], [name] 11/07/2022 Medical Record Number: [id number] Patient Account Number: [id number] Date of Birth/Sex: Treating RN: Nov 29, 1955 (67 y.o. M) Primary Care Provider: Kathyrn Drown Other Clinician: Referring Provider: Treating Provider/Extender: Cherene Julian in Treatment: [address] [name], [name] ([id number]) [id number] Page 13 of 13 Diagnosis Coding ICD-10 Codes Code Description L97.819 Non-pressure chronic ulcer of other part of right lower leg with unspecified severity L97.512 Non-pressure chronic ulcer of other part of right foot with fat layer exposed L97.312 Non-pressure chronic ulcer of right ankle with fat layer exposed E11.622 Type 2 diabetes mellitus with other skin ulcer E11.621 Type 2 diabetes mellitus with foot ulcer D35.70 Chronic systolic (congestive) heart failure I73.9 Peripheral vascular disease, unspecified E11.42 Type 2 diabetes mellitus with diabetic  polyneuropathy I63.9 Cerebral infarction, unspecified I10 Essential (primary) hypertension G89.4 Chronic pain syndrome Facility Procedures : CPT4  Code: 72820601 Description: 56153 - WOUND CARE VISIT-LEV 3 EST PT Modifier: 25 Quantity: 1 : CPT4 Code: 79432761 Description: 47092 - DEB SUBQ TISSUE 20 SQ CM/< ICD-10 Diagnosis Description L97.819 Non-pressure chronic ulcer of other part of right lower leg with unspecified seve Modifier: rity Quantity: 1 : CPT4 Code: 95747340 Description: 37096 - DEBRIDE WOUND 1ST 20 SQ CM OR < ICD-10 Diagnosis Description L97.512 Non-pressure chronic ulcer of other part of right foot with fat layer exposed L97.312 Non-pressure chronic ulcer of right ankle with fat layer exposed Modifier: Quantity: 1 Physician Procedures : CPT4 Code Description Modifier [id number] 40375 - WC PHYS LEVEL 4 - [name] PT 25 ICD-10 Diagnosis Description L97.819 Non-pressure chronic ulcer of other part of right lower leg with unspecified severity L97.512 Non-pressure chronic ulcer of other part of  right foot with fat layer exposed L97.312 Non-pressure chronic ulcer of right ankle with fat layer exposed E11.622 Type 2 diabetes mellitus with other skin ulcer Quantity: 1 : [id number] 11042 - WC PHYS SUBQ TISS 20 SQ CM ICD-10 Diagnosis Description L97.819 Non-pressure chronic ulcer of other part of right lower leg with unspecified severity Quantity: 1 : 0340352 48185 - WC PHYS DEBR WO ANESTH 20 SQ CM ICD-10 Diagnosis Description L97.512 Non-pressure chronic ulcer of other part of right foot with fat layer exposed L97.312 Non-pressure chronic ulcer of right ankle with fat layer exposed Quantity: 1 Electronic Signature(s) Signed: 11/07/2022 11:40:36 AM By: Blanche East RN Signed: 11/07/2022 12:38:37 PM By: [name] [name] FACS Previous Signature: 11/07/2022 11:22:25 AM Version By: [name] [name] FACS Entered By: Blanche East on 11/07/2022 11:40:35

## 2022-11-07 NOTE — Progress Notes (Signed)
[name], [name] ([id number]) [id number] Page 1 of 4 Visit Report for 11/07/2022 Abuse Risk Screen Details Patient Name: Date of Service: [name] 11/07/2022 10:00 A M Medical Record Number: [id number] Patient Account Number: [id number] Date of Birth/Sex: Treating RN: 04/24/1956 (67 y.o. Waldron Session Primary Care Sherree Shankman: Kathyrn Drown Other Clinician: Referring Kamil Hanigan: Treating Tahir Blank/Extender: Cherene Julian in Treatment: 0 Abuse Risk Screen Items Answer ABUSE RISK SCREEN: Has anyone close to you tried to hurt or harm you recentlyo No Do you feel uncomfortable with anyone in your familyo No Has anyone forced you do things that you didnt want to doo No Electronic Signature(s) Signed: 11/07/2022 4:52:32 PM By: Blanche East RN Entered By: Blanche East on 11/07/2022 10:03:45 -------------------------------------------------------------------------------- Activities of Daily Living Details Patient Name: Date of Service: [name] 11/07/2022 10:00 A M Medical Record Number: [id number] Patient Account Number: [id number] Date of Birth/Sex: Treating RN: Feb 19, 1956 (67 y.o. Waldron Session Primary Care Lovell Roe: Kathyrn Drown Other Clinician: Referring Torra Pala: Treating Eulala Newcombe/Extender: Cherene Julian in Treatment: 0 Activities of Daily Living Items Answer Activities of Daily Living (Please select one for each item) Drive Automobile Need Assistance T Medications ake Need Assistance Use T elephone Completely Able Care for Appearance Completely Able Use T oilet Completely Able Bath / Shower Completely Able Dress Self Completely Able Feed Self Completely Able Walk Completely Able Get In / Out Bed Completely [place] Need Assistance Shop for Self Need Assistance Electronic Signature(s) Signed:  11/07/2022 4:52:32 PM By: Blanche East RN Entered By: Blanche East on 11/07/2022 10:04:54 [name] ([id number]) [id number] Page 2 of 4 -------------------------------------------------------------------------------- Education Screening Details Patient Name: Date of Service: [place] RE, [name] 11/07/2022 10:00 A M Medical Record Number: [id number] Patient Account Number: [id number] Date of Birth/Sex: Treating RN: 02/21/1956 (67 y.o. Waldron Session Primary Care Leonia Heatherly: Kathyrn Drown Other Clinician: Referring Slayton Lubitz: Treating Abdulahi Schor/Extender: Cherene Julian in Treatment: 0 Primary Learner Assessed: Patient Learning Preferences/Education Level/Primary Language Learning Preference: Explanation, Demonstration Highest Education Level: High School Preferred Language: English Cognitive Barrier Language Barrier: No Translator Needed: No Memory Deficit: No Emotional Barrier: No Cultural/Religious Beliefs Affecting Medical Care: No Physical Barrier Impaired Vision: Yes Glasses Impaired Hearing: No Decreased Hand dexterity: No Knowledge/Comprehension Knowledge Level: High Comprehension Level: High Ability to understand written instructions: High Ability to understand verbal instructions: High Motivation Anxiety Level: Calm Cooperation: Cooperative Education Importance: Acknowledges Need Interest in Health Problems: Asks Questions Perception: Coherent Willingness to Engage in Self-Management High Activities: Readiness to Engage in Self-Management High Activities: Electronic Signature(s) Signed: 11/07/2022 4:52:32 PM By: Blanche East RN Entered By: Blanche East on 11/07/2022 10:05:46 -------------------------------------------------------------------------------- Fall Risk Assessment Details Patient Name: Date of Service: [name], [name] [name] D. 11/07/2022 10:00 A M Medical Record Number: [id number] Patient  Account Number: [id number] Date of Birth/Sex: Treating RN: Jun 15, 1956 (67 y.o. Waldron Session Primary Care Undrea Archbold: Kathyrn Drown Other Clinician: Referring Cassady Stanczak: Treating Azaela Caracci/Extender: Cherene Julian in Treatment: 0 Fall Risk Assessment Items Have you had 2 or more falls in the last [address] 0 No MYRLE, DUES ([id number]) [phone] [id number] Page 3 of 4 Have you had any fall that resulted in injury in the last 12 monthso 0 No FALLS RISK SCREEN History of falling - immediate or within 3 months 0 No Secondary diagnosis (Do you have 2 or  more medical diagnoseso) 0 No Ambulatory aid None/bed rest/wheelchair/nurse 0 No Crutches/cane/walker 15 Yes Furniture 0 No Intravenous therapy Access/Saline/Heparin Lock 0 No Gait/Transferring Normal/ bed rest/ wheelchair 0 Yes Weak (short steps with or without shuffle, stooped but able to lift head while walking, may seek 0 No support from furniture) Impaired (short steps with shuffle, may have difficulty arising from chair, head down, impaired 0 No balance) Mental Status Oriented to own ability 0 Yes Electronic Signature(s) Signed: 11/07/2022 4:52:32 PM By: Blanche East RN Entered By: Blanche East on 11/07/2022 10:06:20 -------------------------------------------------------------------------------- Foot Assessment Details Patient Name: Date of Service: [name], [name] [name] D. 11/07/2022 10:00 A M Medical Record Number: [id number] Patient Account Number: [id number] Date of Birth/Sex: Treating RN: Jan 05, 1956 (67 y.o. Waldron Session Primary Care Liisa Picone: Kathyrn Drown Other Clinician: Referring Joson Sapp: Treating Gradie Ohm/Extender: Cherene Julian in Treatment: 0 Foot Assessment Items Site Locations + = Sensation present, - = Sensation absent, C = Callus, U = Ulcer R = Redness, W = Warmth, M = Maceration, PU = Pre-ulcerative lesion F = Fissure, S  = Swelling, D = Dryness Assessment Right: Left: Other Deformity: No No Prior Foot Ulcer: No No Prior Amputation: Yes No Charcot Joint: No No Ambulatory Status: Ambulatory With Help Assistance Device: WILKINS, ELPERS ([id number]) [phone] [id number] Page 4 of 4 Gait: Steady Electronic Signature(s) Signed: 11/07/2022 4:52:32 PM By: Blanche East RN Entered By: Blanche East on 11/07/2022 10:08:47 -------------------------------------------------------------------------------- Nutrition Risk Screening Details Patient Name: Date of Service: [name] 11/07/2022 10:00 A M Medical Record Number: [id number] Patient Account Number: [id number] Date of Birth/Sex: Treating RN: [birth year] (67 y.o. Waldron Session Primary Care Amol Domanski: Kathyrn Drown Other Clinician: Referring Bastien Strawser: Treating Shawnya Mayor/Extender: Cherene Julian in Treatment: 0 Height (in): 74 Weight (lbs): 252 Body Mass Index (BMI): 32.4 Nutrition Risk Screening Items Score Screening NUTRITION RISK SCREEN: I have an illness or condition that made me change the kind and/or amount of food I eat 0 No I eat fewer than two meals per day 0 No I eat few fruits and vegetables, or milk products 2 Yes I have three or more drinks of beer, liquor or wine almost every day 0 No I have tooth or mouth problems that make it hard for me to eat 0 No I don't always have enough money to buy the food I need 0 No I eat alone most of the time 0 No I take three or more different prescribed or over-the-counter drugs a day 0 No Without wanting to, I have lost or gained 10 pounds in the last six months 0 No I am not always physically able to shop, cook and/or feed myself 0 No Nutrition Protocols Good Risk Protocol 0 No interventions needed Moderate Risk Protocol High Risk Proctocol Risk Level: Good Risk Score: 2 Electronic Signature(s) Signed: 11/07/2022 4:52:32 PM By: Blanche East RN Entered By: Blanche East on 11/07/2022 10:06:54

## 2022-11-08 ENCOUNTER — Ambulatory Visit: Payer: Medicare HMO | Admitting: Neurology

## 2022-11-08 ENCOUNTER — Encounter: Payer: Self-pay | Admitting: Neurology

## 2022-11-08 ENCOUNTER — Other Ambulatory Visit: Payer: Self-pay | Admitting: Neurology

## 2022-11-08 ENCOUNTER — Telehealth: Payer: Self-pay | Admitting: Neurology

## 2022-11-08 VITALS — BP 143/81 | HR 84 | Ht 74.0 in | Wt 247.2 lb

## 2022-11-08 DIAGNOSIS — R410 Disorientation, unspecified: Secondary | ICD-10-CM

## 2022-11-08 DIAGNOSIS — R413 Other amnesia: Secondary | ICD-10-CM

## 2022-11-08 DIAGNOSIS — R9431 Abnormal electrocardiogram [ECG] [EKG]: Secondary | ICD-10-CM

## 2022-11-08 DIAGNOSIS — G3184 Mild cognitive impairment, so stated: Secondary | ICD-10-CM

## 2022-11-08 DIAGNOSIS — R69 Illness, unspecified: Secondary | ICD-10-CM | POA: Diagnosis not present

## 2022-11-08 DIAGNOSIS — E7800 Pure hypercholesterolemia, unspecified: Secondary | ICD-10-CM | POA: Diagnosis not present

## 2022-11-08 DIAGNOSIS — F0150 Vascular dementia without behavioral disturbance: Secondary | ICD-10-CM

## 2022-11-08 DIAGNOSIS — Z8673 Personal history of transient ischemic attack (TIA), and cerebral infarction without residual deficits: Secondary | ICD-10-CM | POA: Diagnosis not present

## 2022-11-08 DIAGNOSIS — I440 Atrioventricular block, first degree: Secondary | ICD-10-CM

## 2022-11-08 DIAGNOSIS — I5020 Unspecified systolic (congestive) heart failure: Secondary | ICD-10-CM

## 2022-11-08 DIAGNOSIS — R799 Abnormal finding of blood chemistry, unspecified: Secondary | ICD-10-CM | POA: Diagnosis not present

## 2022-11-08 MED ORDER — CLOPIDOGREL BISULFATE 75 MG PO TABS: 75.0000 mg | ORAL_TABLET | Freq: Every day | ORAL | 11 refills | Status: DC

## 2022-11-08 NOTE — Progress Notes (Signed)
[facility] [address] [place]. [place] [address] [id number]       OFFICE CONSULT NOTE  Mr. [name] Date of Birth:  1956/10/04 Medical Record Number:  [id number]   Referring MD: Sallee Lange  Reason for Referral: History of stroke  HPI: [name] is a 67 year old Caucasian male seen today for initial office consultation visit for history of stroke.  He is accompanied by his wife today.  History is obtained from them and review of electronic medical practice.  I personally reviewed pertinent available imaging films in PACS.  Patient states he has a history of stroke and 2019 when he had right-sided weakness and had memory loss since then which has not recovered.  Review of electronic medical record shows that MRI scan at that time had shown left corona radiator infarct with small vessel disease.  2D echo showed ejection fraction of 30 to 35% with grade 2 diastolic dysfunction.  Carotid ultrasound showed only mild obstruction.  LDL cholesterol was elevated at 100 started on Crestor daily.  Patient states that since then he is multiple other episodes of transient confusion and blurred vision which may have been TIAs never followed up with any neurologist as an outpatient.  He had a recent MRI on 03/08/2022 which showed a small left posterior parietal and temporal infarct with extensive changes of chronic small vessel disease and mild degree of generalized cerebral atrophy.  Patient states open extremely compliant with his medications refilled follow-up with lab work on 02/25/2022 showed elevated hemoglobin A1c cholesterol 77 carotid ultrasound on 05/17/2022 which showed no significant patient has not had any very poor historian and neither him or his wife are able to tell me specific details about his episodes of confusion and slurred speech as to how frequent they are and when was the last one was..  He apparently has not had any recent neurovascular imaging.  He has not had any  lab work for evaluation for causes of memory loss.  He and his wife are raising 2 teenagers and he is noticing increasing difficulty with progressive memory loss and cognitive impairment. ROS:   14 system review of systems is positive for memory loss, knee pain, difficulty walking, confusion, blurred vision, slurred speech, congestive heart failure all other systems negative  PMH:  Past Medical History:  Diagnosis Date   CAD (coronary artery disease)    a. s/p BMS to RCA in 2008 b. no ischemia by NST in 03/2021   Cardiomyopathy [facility])    CHF (congestive heart failure) ([place])    a. EF 30-35% in 2019 with similar results by repeat echo in 03/2021   Depression    DJD (degenerative joint disease)    DM type 2 with diabetic mixed hyperlipidemia ([place]) 12/11/2018   Essential hypertension    Gout    History of inferior wall myocardial infarction 2008   Hypercholesterolemia    Hyperlipidemia    Obesity    Oral candidiasis    Peripheral vascular disease ([place])    Stroke [facility])    December 2019   Type 2 diabetes mellitus ([place])     Social History:  Social History   Socioeconomic History   Marital status: Married    Spouse name: Not on file   Number of children: Not on file   Years of education: Not on file   Highest education level: Not on file  Occupational History   Not on file  Tobacco Use   Smoking status: Former  Packs/day: 1.00    Years: 10.00    Total pack years: 10.00    Types: Cigarettes    Quit date: 12/23/1970    Years since quitting: 51.9   Smokeless tobacco: Never   Tobacco comments:    quit in 1979  Vaping Use   Vaping Use: Never used  Substance and Sexual Activity   Alcohol use: No    Alcohol/week: 0.0 standard drinks of alcohol   Drug use: Yes    Types: Marijuana   Sexual activity: Yes    Birth control/protection: None  Other Topics Concern   Not on file  Social History Narrative    He is married and has 3 kids.  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  He denies any tobacco or alcohol use.    Social Determinants of Health   Financial Resource Strain: Not on file  Food Insecurity: Not on file  Transportation Needs: Not on file  Physical Activity: Not on file  Stress: Not on file  Social Connections: Not on file  Intimate Partner Violence: Not on file    Medications:   Current Outpatient Medications on File Prior to Visit  Medication Sig Dispense Refill   atorvastatin (LIPITOR) 10 MG tablet TAKE ONE TABLET BY MOUTH ON MONDAY AND FRIDAY 24 tablet 1   betamethasone valerate ointment (VALISONE) 0.1 % APPLY 1 APPLICATION TOPICALLY 2 (TWO) TIMES DAILY AS NEEDED. (Patient taking differently: Apply 1 application  topically 2 (two) times daily as needed (Psoriasis).) 45 g 0   doxycycline (VIBRAMYCIN) 100 MG capsule Take 1 capsule (100 mg total) by mouth 2 (two) times daily. 14 capsule 0   ergocalciferol (VITAMIN D2) 1.25 MG (50000 UT) capsule Take 1 capsule by mouth once a week.     furosemide (LASIX) 20 MG tablet Take 1 tablet by mouth every morning as needed 30 tablet 6   insulin glargine (LANTUS) 100 unit/mL SOPN Inject 54 units at bedtime may titrate up to 60 units into the skin at bedtime for diabetes 15 mL 0   Insulin Pen Needle (B-D ULTRAFINE III SHORT PEN) 31G X 8 MM MISC USE PEN NEEDLES DAILY WITH LANTUS 100 each 5   lisinopril (ZESTRIL) 20 MG tablet Take 1 tablet every day by oral route.     metFORMIN (GLUCOPHAGE-XR) 500 MG 24 hr tablet TAKE 1 TABLET BY MOUTH EVERY DAY WITH BREAKFAST 90 tablet 0   metoprolol succinate (TOPROL XL) 25 MG 24 hr tablet Take 1 tablet (25 mg total) by mouth daily. 90 tablet 3   Multiple Vitamins-Minerals (MULTIVITAMIN WITH MINERALS) tablet Take 1 tablet by mouth daily.     mupirocin ointment (BACTROBAN) 2 % Apply topically daily. Apply thin amount bid 22 g 1   oxyCODONE-acetaminophen (PERCOCET) 10-325 MG tablet 1 taken 4 times daily as needed for pain 120 tablet 0   Semaglutide (RYBELSUS) 3 MG  TABS Take 1 tablet by mouth daily.     sertraline (ZOLOFT) 50 MG tablet Take 1 tablet (50 mg total) by mouth daily. 30 tablet 3   valsartan (DIOVAN) 40 MG tablet Take 1 tablet (40 mg total) by mouth daily. 30 tablet 5   silver sulfADIAZINE (SILVADENE) 1 % cream Apply 1 Application topically daily. (Patient not taking: Reported on 11/08/2022) 50 g 6   No current facility-administered medications on file prior to visit.    Allergies:   Allergies  Allergen Reactions   Metformin And Related  GI symptoms   Statins Other (See Comments)    Legs ache, he states Crestor cause shoulder pain, states Crestor caused rash on hands   Vicodin [Hydrocodone-Acetaminophen] Nausea Only    Physical Exam General: well developed, well nourished, middle-aged Caucasian male seated, in no evident distress Head: head normocephalic and atraumatic.   Neck: supple with no carotid or supraclavicular bruits Cardiovascular: regular rate and rhythm, no murmurs Musculoskeletal: Right knee with valgus deformity Skin:  no rash/petichiae 1+ pedal edema Vascular:  Normal pulses all extremities  Neurologic Exam Mental Status: Awake and fully alert. Oriented to place and time. Recent and remote memory poor. Attention span, concentration and fund of knowledge diminished. Mood and affect appropriate.  Recall 0/3.  Able to name only 6 animals which can walk on 4 legs.  Clock drawing 2/4.  Mini-Mental status exam scored 19/30. Cranial Nerves: Fundoscopic exam reveals sharp disc margins. Pupils equal, briskly reactive to light. Extraocular movements full without nystagmus. Visual fields full to confrontation. Hearing mildly diminished bilaterally.. Facial sensation intact. Face, tongue, palate moves normally and symmetrically.  Motor: Normal bulk and tone. Normal strength in all tested extremity muscles. Sensory.: intact to touch , pinprick , position and vibratory sensation.  Coordination: Rapid alternating movements normal in  all extremities. Finger-to-nose and heel-to-shin performed accurately bilaterally. Gait and Station: Arises from chair with mild difficulty. Stance is normal. Gait is slow, cautious and antalgic with favoring the right knee due to pain which has genu valgus deformity  reflexes: 1+ and symmetric. Toes downgoing.   NIHSS  1 Modified Rankin  3     11/08/2022   10:29 AM  MMSE - Mini Mental State Exam  Orientation to time 3  Orientation to Place 5  Registration 3  Attention/ Calculation 1  Recall 2  Language- name 2 objects 2  Language- repeat 0  Language- follow 3 step command 3  Language- read & follow direction 0  Write a sentence 0  Copy design 0  Total score 19    ASSESSMENT: 66 year old Caucasian male with remote history of left hemispheric subcortical infarct in 2019 followed by cognitive impairment with recurrent episodes of transient confusion with neurological exam now consistent with mild cognitive impairment versus early vascular dementia.  Vascular risk factors of diabetes, hypertension, hyperlipidemia, obesity, coronary artery disease, cardiomyopathy history of cerebrovascular disease     PLAN:I had a long d/w patient and his wife about his remote stroke, risk for recurrent stroke/TIAs, personally independently reviewed imaging studies and stroke evaluation results and answered questions .Discontinue aspirin daily and switch to Plavix 75 mg daily. for secondary stroke prevention and maintain strict control of hypertension with blood pressure goal below 130/90, diabetes with hemoglobin A1c goal below 6.5% and lipids with LDL cholesterol goal below 70 mg/dL. I also advised the patient to eat a healthy diet with plenty of whole grains, cereals, fruits and vegetables, exercise regularly and maintain ideal body weight .check memory panel labs, lipid profile, hemoglobin A1c, EEG and MRI scan of the brain with MRI of the brain and neck.  Echocardiogram 30-day heart monitor for  paroxysmal A-fib.  I encouraged the patient to increase participation in cognitively challenging activities like solving crossword puzzles, playing bridge and sudoku.  We may need to consider medications like Aricept at next follow-up visit.  Followup in the future with me in 3 months or call earlier if necessary. Greater than 50% time during this prolonged 60-minute consultation visit was spent on counseling and coordination of care  about his history of strokes, confusion episodes, memory loss and dementia and answering questions. Antony Contras, MD Note: This document was prepared with digital dictation and possible smart phrase technology. Any transcriptional errors that result from this process are unintentional.

## 2022-11-08 NOTE — Patient Instructions (Signed)
I had a long d/w patient and his wife about his remote stroke, risk for recurrent stroke/TIAs, personally independently reviewed imaging studies and stroke evaluation results and answered questions .Discontinue aspirin daily and switch to Plavix 75 mg daily. for secondary stroke prevention and maintain strict control of hypertension with blood pressure goal below 130/90, diabetes with hemoglobin A1c goal below 6.5% and lipids with LDL cholesterol goal below 70 mg/dL. I also advised the patient to eat a healthy diet with plenty of whole grains, cereals, fruits and vegetables, exercise regularly and maintain ideal body weight .check memory panel labs, lipid profile, hemoglobin A1c, EEG and MRI scan of the brain with MRI of the brain and neck.  Echocardiogram 30-day heart monitor for paroxysmal A-fib.  I encouraged the patient to increase participation in cognitively challenging activities like solving crossword puzzles, playing bridge and sudoku.  We may need to consider medications like Aricept at next follow-up visit.  Followup in the future with me in 3 months or call earlier if necessary.  Stroke Prevention Some medical conditions and behaviors can lead to a higher chance of having a stroke. You can help prevent a stroke by eating healthy, exercising, not smoking, and managing any medical conditions you have. Stroke is a leading cause of functional impairment. Primary prevention is particularly important because a majority of strokes are first-time events. Stroke changes the lives of not only those who experience a stroke but also their family and other caregivers. How can this condition affect me? A stroke is a medical emergency and should be treated right away. A stroke can lead to brain damage and can sometimes be life-threatening. If a person gets medical treatment right away, there is a better chance of surviving and recovering from a stroke. What can increase my risk? The following medical conditions  may increase your risk of a stroke: Cardiovascular disease. High blood pressure (hypertension). Diabetes. High cholesterol. Sickle cell disease. Blood clotting disorders (hypercoagulable state). Obesity. Sleep disorders (obstructive sleep apnea). Other risk factors include: Being older than age 67. Having a history of blood clots, stroke, or mini-stroke (transient ischemic attack, TIA). Genetic factors, such as race, ethnicity, or a family history of stroke. Smoking cigarettes or using other tobacco products. Taking birth control pills, especially if you also use tobacco. Heavy use of alcohol or drugs, especially cocaine and methamphetamine. Physical inactivity. What actions can I take to prevent this? Manage your health conditions High cholesterol levels. Eating a healthy diet is important for preventing high cholesterol. If cholesterol cannot be managed through diet alone, you may need to take medicines. Take any prescribed medicines to control your cholesterol as told by your health care provider. Hypertension. To reduce your risk of stroke, try to keep your blood pressure below 130/80. Eating a healthy diet and exercising regularly are important for controlling blood pressure. If these steps are not enough to manage your blood pressure, you may need to take medicines. Take any prescribed medicines to control hypertension as told by your health care provider. Ask your health care provider if you should monitor your blood pressure at home. Have your blood pressure checked every year, even if your blood pressure is normal. Blood pressure increases with age and some medical conditions. Diabetes. Eating a healthy diet and exercising regularly are important parts of managing your blood sugar (glucose). If your blood sugar cannot be managed through diet and exercise, you may need to take medicines. Take any prescribed medicines to control your diabetes as told by  your health care  provider. Get evaluated for obstructive sleep apnea. Talk to your health care provider about getting a sleep evaluation if you snore a lot or have excessive sleepiness. Make sure that any other medical conditions you have, such as atrial fibrillation or atherosclerosis, are managed. Nutrition Follow instructions from your health care provider about what to eat or drink to help manage your health condition. These instructions may include: Reducing your daily calorie intake. Limiting how much salt (sodium) you use to 1,500 milligrams (mg) each day. Using only healthy fats for cooking, such as olive oil, canola oil, or sunflower oil. Eating healthy foods. You can do this by: Choosing foods that are high in fiber, such as whole grains, and fresh fruits and vegetables. Eating at least 5 servings of fruits and vegetables a day. Try to fill one-half of your plate with fruits and vegetables at each meal. Choosing lean protein foods, such as lean cuts of meat, poultry without skin, fish, tofu, beans, and nuts. Eating low-fat dairy products. Avoiding foods that are high in sodium. This can help lower blood pressure. Avoiding foods that have saturated fat, trans fat, and cholesterol. This can help prevent high cholesterol. Avoiding processed and prepared foods. Counting your daily carbohydrate intake.  Lifestyle If you drink alcohol: Limit how much you have to: 0-1 drink a day for women who are not pregnant. 0-2 drinks a day for men. Know how much alcohol is in your drink. In the [place] one drink equals one 12 oz bottle of beer (359m), one 5 oz glass of wine (1465m, or one 1 oz glass of hard liquor (4416m Do not use any products that contain nicotine or tobacco. These products include cigarettes, chewing tobacco, and vaping devices, such as e-cigarettes. If you need help quitting, ask your health care provider. Avoid secondhand smoke. Do not use drugs. Activity  Try to stay at a healthy  weight. Get at least 30 minutes of exercise on most days, such as: Fast walking. Biking. Swimming. Medicines Take over-the-counter and prescription medicines only as told by your health care provider. Aspirin or blood thinners (antiplatelets or anticoagulants) may be recommended to reduce your risk of forming blood clots that can lead to stroke. Avoid taking birth control pills. Talk to your health care provider about the risks of taking birth control pills if: You are over 35 56ars old. You smoke. You get very bad headaches. You have had a blood clot. Where to find more information American Stroke Association: www.strokeassociation.org Get help right away if: You or a loved one has any symptoms of a stroke. "BE FAST" is an easy way to remember the main warning signs of a stroke: B - Balance. Signs are dizziness, sudden trouble walking, or loss of balance. E - Eyes. Signs are trouble seeing or a sudden change in vision. F - Face. Signs are sudden weakness or numbness of the face, or the face or eyelid drooping on one side. A - Arms. Signs are weakness or numbness in an arm. This happens suddenly and usually on one side of the body. S - Speech. Signs are sudden trouble speaking, slurred speech, or trouble understanding what people say. T - Time. Time to call emergency services. Write down what time symptoms started. You or a loved one has other signs of a stroke, such as: A sudden, severe headache with no known cause. Nausea or vomiting. Seizure. These symptoms may represent a serious problem that is an emergency. Do not wait to  see if the symptoms will go away. Get medical help right away. Call your local emergency services (911 in the [place] Do not drive yourself to the hospital. Summary You can help to prevent a stroke by eating healthy, exercising, not smoking, limiting alcohol intake, and managing any medical conditions you may have. Do not use any products that contain nicotine or  tobacco. These include cigarettes, chewing tobacco, and vaping devices, such as e-cigarettes. If you need help quitting, ask your health care provider. Remember "BE FAST" for warning signs of a stroke. Get help right away if you or a loved one has any of these signs. This information is not intended to replace advice given to you by your health care provider. Make sure you discuss any questions you have with your health care provider. Document Revised: 05/04/2020 Document Reviewed: 05/04/2020 Elsevier Patient Education  [employer].

## 2022-11-08 NOTE — Telephone Encounter (Signed)
[employer] medicare sent to [facility] they obtain auth [id number] 

## 2022-11-09 LAB — LIPID PANEL
Chol/HDL Ratio: 2.9 ratio (ref 0.0–5.0)
Cholesterol, Total: 132 mg/dL (ref 100–199)
HDL: 46 mg/dL (ref >39)
LDL Chol Calc (NIH): 76 mg/dL (ref 0–99)
Triglycerides: 43 mg/dL (ref 0–149)
VLDL Cholesterol Cal: 10 mg/dL (ref 5–40)

## 2022-11-09 LAB — HEMOGLOBIN A1C
Est. average glucose Bld gHb Est-mCnc: 163 mg/dL
Hgb A1c MFr Bld: 7.3 % — ABNORMAL HIGH (ref 4.8–5.6)

## 2022-11-09 LAB — DEMENTIA PANEL
Homocysteine: 15.3 umol/L (ref 0.0–17.2)
RPR Ser Ql: NONREACTIVE
TSH: 1.98 u[IU]/mL (ref 0.450–4.500)
Vitamin B-12: 466 pg/mL (ref 232–1245)

## 2022-11-14 ENCOUNTER — Encounter (HOSPITAL_BASED_OUTPATIENT_CLINIC_OR_DEPARTMENT_OTHER): Payer: Medicare HMO | Admitting: General Surgery

## 2022-11-14 DIAGNOSIS — I110 Hypertensive heart disease with heart failure: Secondary | ICD-10-CM | POA: Diagnosis not present

## 2022-11-14 DIAGNOSIS — L97312 Non-pressure chronic ulcer of right ankle with fat layer exposed: Secondary | ICD-10-CM | POA: Diagnosis not present

## 2022-11-14 DIAGNOSIS — E1151 Type 2 diabetes mellitus with diabetic peripheral angiopathy without gangrene: Secondary | ICD-10-CM | POA: Diagnosis not present

## 2022-11-14 DIAGNOSIS — I429 Cardiomyopathy, unspecified: Secondary | ICD-10-CM | POA: Diagnosis not present

## 2022-11-14 DIAGNOSIS — I5022 Chronic systolic (congestive) heart failure: Secondary | ICD-10-CM | POA: Diagnosis not present

## 2022-11-14 DIAGNOSIS — L97812 Non-pressure chronic ulcer of other part of right lower leg with fat layer exposed: Secondary | ICD-10-CM | POA: Diagnosis not present

## 2022-11-14 DIAGNOSIS — E11622 Type 2 diabetes mellitus with other skin ulcer: Secondary | ICD-10-CM | POA: Diagnosis not present

## 2022-11-14 DIAGNOSIS — E1142 Type 2 diabetes mellitus with diabetic polyneuropathy: Secondary | ICD-10-CM | POA: Diagnosis not present

## 2022-11-14 DIAGNOSIS — L97512 Non-pressure chronic ulcer of other part of right foot with fat layer exposed: Secondary | ICD-10-CM | POA: Diagnosis not present

## 2022-11-14 DIAGNOSIS — I2510 Atherosclerotic heart disease of native coronary artery without angina pectoris: Secondary | ICD-10-CM | POA: Diagnosis not present

## 2022-11-14 DIAGNOSIS — E11621 Type 2 diabetes mellitus with foot ulcer: Secondary | ICD-10-CM | POA: Diagnosis not present

## 2022-11-14 DIAGNOSIS — E785 Hyperlipidemia, unspecified: Secondary | ICD-10-CM | POA: Diagnosis not present

## 2022-11-14 NOTE — Progress Notes (Addendum)
JERIS, ROSER ([id number]) [id number] Page 1 of 11 Visit Report for 11/14/2022 Chief Complaint Document Details Patient Name: Date of Service: [name] 11/14/2022 10:15 A M Medical Record Number: [id number] Patient Account Number: [id number] Date of Birth/Sex: Treating RN: 08-09-1956 (67 y.o. M) Primary Care Provider: Kathyrn Drown Other Clinician: Referring Provider: Treating Provider/Extender: Alean Rinne in Treatment: 1 Information Obtained from: Patient Chief Complaint Patients presents for treatment of an open diabetic ulcer on his right foot, as well as ulcers on his right lower leg and ankle Electronic Signature(s) Signed: 11/14/2022 10:52:21 AM By: [name] MD FACS Entered By: [name] on 11/14/2022 10:52:21 -------------------------------------------------------------------------------- Debridement Details Patient Name: Date of Service: [name], [name] 11/14/2022 10:15 A M Medical Record Number: [id number] Patient Account Number: [id number] Date of Birth/Sex: Treating RN: 12-28-1955 (67 y.o. Waldron Session Primary Care Provider: Kathyrn Drown Other Clinician: Referring Provider: Treating Provider/Extender: Alean Rinne in Treatment: 1 Debridement Performed for Assessment: Wound #1 Right,Lateral Lower Leg Performed By: Physician [name], MD Debridement Type: Debridement Severity of Tissue Pre Debridement: Fat layer exposed Level of Consciousness (Pre-procedure): Awake and Alert Pre-procedure Verification/Time Out Yes - 10:46 Taken: Start Time: 10:47 Pain Control: Lidocaine 4% T opical Solution T Area Debrided (L x W): otal 5.5 (cm) x 1.3 (cm) = 7.15 (cm) Tissue and other material debrided: Viable, Non-Viable, Slough, Subcutaneous, Slough Level: Skin/Subcutaneous Tissue Debridement Description: Excisional Instrument: Curette Bleeding:  Minimum Hemostasis Achieved: Pressure Response to Treatment: Procedure was tolerated well Level of Consciousness (Post- Awake and Alert procedure): Post Debridement Measurements of Total Wound Length: (cm) 5.5 Width: (cm) 1.3 Depth: (cm) 0.3 Volume: (cm) 1.685 Character of Wound/Ulcer Post Debridement: Requires Further Debridement Severity of Tissue Post Debridement: Fat layer exposed Post Procedure Diagnosis Same as Pre-procedure [name] ([id number]) [phone].pdf Page 2 of 11 Notes Scribed for Dr. Celine Ahr by Blanche East, RN Electronic Signature(s) Signed: 11/14/2022 12:30:26 PM By: [name] MD FACS Signed: 11/14/2022 4:55:35 PM By: Blanche East RN Entered By: Blanche East on 11/14/2022 11:18:04 -------------------------------------------------------------------------------- Debridement Details Patient Name: Date of Service: [name], [name] 11/14/2022 10:15 A M Medical Record Number: [id number] Patient Account Number: [id number] Date of Birth/Sex: Treating RN: 1956/04/01 (67 y.o. Waldron Session Primary Care Provider: Kathyrn Drown Other Clinician: Referring Provider: Treating Provider/Extender: Alean Rinne in Treatment: 1 Debridement Performed for Assessment: Wound #2 Right,Lateral Ankle Performed By: Physician [name], MD Debridement Type: Debridement Severity of Tissue Pre Debridement: Fat layer exposed Level of Consciousness (Pre-procedure): Awake and Alert Pre-procedure Verification/Time Out Yes - 10:46 Taken: Start Time: 10:47 Pain Control: Lidocaine 4% T opical Solution T Area Debrided (L x W): otal 1.3 (cm) x 0.6 (cm) = 0.78 (cm) Tissue and other material debrided: Viable, Non-Viable, Slough, Subcutaneous, Slough Level: Skin/Subcutaneous Tissue Debridement Description: Excisional Instrument: Curette Bleeding: Minimum Hemostasis Achieved: Pressure Response to Treatment:  Procedure was tolerated well Level of Consciousness (Post- Awake and Alert procedure): Post Debridement Measurements of Total Wound Length: (cm) 1.3 Width: (cm) 0.6 Depth: (cm) 0.2 Volume: (cm) 0.123 Character of Wound/Ulcer Post Debridement: Requires Further Debridement Severity of Tissue Post Debridement: Fat layer exposed Post Procedure Diagnosis Same as Pre-procedure Notes Scribed for Dr. Celine Ahr by Blanche East, RN Electronic Signature(s) Signed: 11/14/2022 12:30:26 PM By: [name] MD FACS Signed: 11/14/2022 4:55:35 PM By: Blanche East RN Entered By: Blanche East on 11/14/2022 11:19:33 HPI Details --------------------------------------------------------------------------------  [name] ([id number]) [id number] Page 3 of 11 Patient Name: Date of Service: [name], [name] 11/14/2022 10:15 A M Medical Record Number: [id number] Patient Account Number: [id number] Date of Birth/Sex: Treating RN: 1956/01/11 (67 y.o. M) Primary Care Provider: Kathyrn Drown Other Clinician: Referring Provider: Treating Provider/Extender: Alean Rinne in Treatment: 1 History of Present Illness HPI Description: ADMISSION 11/07/2022 This is a 67 year old man with a past medical history significant for type 2 diabetes (last hemoglobin A1c 7.1), cerebrovascular occlusive disease, coronary artery disease, peripheral vascular disease, congestive heart failure, chronic pain syndrome, and prior amputation of his right great and second toes. He has a remote history of surgery on his right ankle and apparently as his legs have become increasingly more swollen and he has insisted upon wearing boots, the pressure and friction seem to have opened up wounds along the old scar line. In addition, he has a large callus on the third metatarsal head, related to his acquired foot deformity. He sees a podiatrist who periodically shaves this callus down  and he was also recommended orthotics, but the patient has refused them because he likes his boots. There is an area of discoloration in the lower portion of this callus, suspicious for an open wound. 11/14/2022: The ulcer on his third metatarsal head has closed. The lateral leg ulcers are both about the same size, but he says that they feel better. There is slough accumulation in both sites. Electronic Signature(s) Signed: 11/14/2022 10:53:00 AM By: [name] MD FACS Entered By: [name] on 11/14/2022 10:53:00 -------------------------------------------------------------------------------- Physical Exam Details Patient Name: Date of Service: [name], [name] 11/14/2022 10:15 A M Medical Record Number: [id number] Patient Account Number: [id number] Date of Birth/Sex: Treating RN: 08-11-1956 (67 y.o. M) Primary Care Provider: Kathyrn Drown Other Clinician: Referring Provider: Treating Provider/Extender: Kristine Royal, Scott A Weeks in Treatment: 1 Constitutional Slightly hypertensive. . . . no acute distress. Respiratory Normal work of breathing on room air. Notes 11/14/2022: The ulcer on his third metatarsal head has closed. The lateral leg ulcers are both about the same size, but he says that they feel better. There is slough accumulation in both sites. Electronic Signature(s) Signed: 11/14/2022 10:53:33 AM By: [name] MD FACS Entered By: [name] on 11/14/2022 10:53:33 -------------------------------------------------------------------------------- Physician Orders Details Patient Name: Date of Service: [name], [name] 11/14/2022 10:15 A M Medical Record Number: [id number] Patient Account Number: [id number] Date of Birth/Sex: Treating RN: 1956/02/15 (67 y.o. Waldron Session Primary Care Provider: Kathyrn Drown Other Clinician: Referring Provider: Treating Provider/Extender: Alean Rinne in Treatment:  1 Verbal / Phone Orders: No [name] ([id number]) [id number] Page 4 of 11 Diagnosis Coding ICD-10 Coding Code Description L97.819 Non-pressure chronic ulcer of other part of right lower leg with unspecified severity L97.512 Non-pressure chronic ulcer of other part of right foot with fat layer exposed L97.312 Non-pressure chronic ulcer of right ankle with fat layer exposed E11.622 Type 2 diabetes mellitus with other skin ulcer E11.621 Type 2 diabetes mellitus with foot ulcer E56.31 Chronic systolic (congestive) heart failure I73.9 Peripheral vascular disease, unspecified E11.42 Type 2 diabetes mellitus with diabetic polyneuropathy I63.9 Cerebral infarction, unspecified I10 Essential (primary) hypertension G89.4 Chronic pain syndrome Follow-up Appointments ppointment in 1 week. - Dr. Celine Ahr Rm 4 Return A Anesthetic Wound #1 Right,Lateral Lower Leg (In clinic) Topical Lidocaine 5% applied to wound bed - Prior to debridement Wound #2 Right,Lateral Ankle (  In clinic) Topical Lidocaine 5% applied to wound bed - Prior to debridement Wound #3 Right Metatarsal head second (In clinic) Topical Lidocaine 5% applied to wound bed - Prior to debridement Bathing/ Shower/ Hygiene May shower with protection but do not get wound dressing(s) wet. Protect dressing(s) with water repellant cover (for example, large plastic bag) or a cast cover and may then take shower. - May purchase cast protector at [facility], [facility], or [facility] Edema Control - Lymphedema / SCD / Other Right Lower Extremity Elevate legs to the level of the heart or above for 30 minutes daily and/or when sitting for 3-4 times a day throughout the day. Avoid standing for long periods of time. Wound Treatment Wound #1 - Lower Leg Wound Laterality: Right, Lateral Cleanser: Soap and Water Discharge Instructions: May shower and wash wound with dial antibacterial soap and water prior to dressing  change. Cleanser: Wound Cleanser Discharge Instructions: Cleanse the wound with wound cleanser prior to applying a clean dressing using gauze sponges, not tissue or cotton balls. Peri-Wound Care: Sween Lotion (Moisturizing lotion) Discharge Instructions: Apply moisturizing lotion as directed Prim Dressing: [place] Dressing 2x2 (in/in) ary Discharge Instructions: Apply to wound bed as instructed Secondary Dressing: Zetuvit Plus 4x8 in Discharge Instructions: Apply over primary dressing as directed. Secured With: Child psychotherapist, Sterile 2x75 (in/in) Discharge Instructions: Secure with stretch gauze as directed. Secured With: Transpore Surgical Tape, 2x10 (in/yd) Discharge Instructions: Secure dressing with tape as directed. Compression Wrap: ThreePress (3 layer compression wrap) Discharge Instructions: Apply three layer compression as directed. Wound #2 - Ankle Wound Laterality: Right, Lateral Cleanser: Soap and Water Discharge Instructions: May shower and wash wound with dial antibacterial soap and water prior to dressing change. Cleanser: Wound Cleanser Discharge Instructions: Cleanse the wound with wound cleanser prior to applying a clean dressing using gauze sponges, not tissue or cotton balls. Peri-Wound Care: Sween Lotion (Moisturizing lotion) Discharge Instructions: Apply moisturizing lotion as directed [name] ([id number]) [phone].pdf Page 5 of 11 Prim Dressing: [place] Dressing 2x2 (in/in) ary Discharge Instructions: Apply to wound bed as instructed Secondary Dressing: Zetuvit Plus 4x8 in Discharge Instructions: Apply over primary dressing as directed. Secured With: Child psychotherapist, Sterile 2x75 (in/in) Discharge Instructions: Secure with stretch gauze as directed. Secured With: Transpore Surgical Tape, 2x10 (in/yd) Discharge Instructions: Secure dressing with tape as directed. Compression Wrap:  ThreePress (3 layer compression wrap) Discharge Instructions: Apply three layer compression as directed. Wound #3 - Metatarsal head second Wound Laterality: Right Cleanser: Soap and Water Discharge Instructions: May shower and wash wound with dial antibacterial soap and water prior to dressing change. Cleanser: Wound Cleanser Discharge Instructions: Cleanse the wound with wound cleanser prior to applying a clean dressing using gauze sponges, not tissue or cotton balls. Peri-Wound Care: Sween Lotion (Moisturizing lotion) Discharge Instructions: Apply moisturizing lotion as directed Prim Dressing: [place] Dressing 2x2 (in/in) ary Discharge Instructions: Apply to wound bed as instructed Secondary Dressing: Optifoam Non-Adhesive Dressing, 4x4 in Discharge Instructions: Apply over primary dressing as directed. Secondary Dressing: Zetuvit Plus 4x8 in Discharge Instructions: Apply over primary dressing as directed. Secured With: Child psychotherapist, Sterile 2x75 (in/in) Discharge Instructions: Secure with stretch gauze as directed. Secured With: Transpore Surgical Tape, 2x10 (in/yd) Discharge Instructions: Secure dressing with tape as directed. Compression Wrap: ThreePress (3 layer compression wrap) Discharge Instructions: Apply three layer compression as directed. Electronic Signature(s) Signed: 11/14/2022 12:30:26 PM By: [name] MD FACS Signed: 11/14/2022 4:55:35 PM By: Blanche East RN  Entered By: Blanche East on 11/14/2022 11:19:43 -------------------------------------------------------------------------------- Problem List Details Patient Name: Date of Service: [name] 11/14/2022 10:15 A M Medical Record Number: [id number] Patient Account Number: [id number] Date of Birth/Sex: Treating RN: 01/30/1956 (67 y.o. 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 layer exposed 11/07/2022 No Yes E11.622 Type 2 diabetes mellitus with other skin ulcer 11/07/2022 No Yes E11.621 Type 2 diabetes mellitus with foot ulcer 11/07/2022 No Yes G86.76 Chronic systolic (congestive) heart failure 11/07/2022 No Yes I73.9 Peripheral vascular disease, unspecified 11/07/2022 No Yes E11.42 Type 2 diabetes mellitus with diabetic polyneuropathy 11/07/2022 No Yes I63.9 Cerebral infarction, unspecified 11/07/2022 No Yes I10 Essential (primary) hypertension 11/07/2022 No Yes G89.4 Chronic pain syndrome 11/07/2022 No Yes Inactive Problems Resolved Problems Electronic Signature(s) Signed: 11/14/2022 10:44:20 AM By: [name] MD FACS Entered By: [name] on 11/14/2022 10:44:20 -------------------------------------------------------------------------------- Progress Note Details Patient Name: Date of Service: [name], [name] 11/14/2022 10:15 A M Medical Record Number: [id number] Patient Account Number: [id number] Date of Birth/Sex: Treating RN: February 03, 1956 (67 y.o. M) Primary Care Provider: Kathyrn Drown Other Clinician: Referring Provider: Treating Provider/Extender: Alean Rinne in Treatment: 1 Subjective Chief Complaint Information obtained from Patient Patients presents for treatment of an open diabetic ulcer on his right foot, as well as ulcers on his right lower leg and ankle History of Present Illness (HPI) [name] ([id number]) [phone].pdf Page  7 of 11 ADMISSION 11/07/2022 This is a 67 year old man with a past medical history significant for type 2 diabetes (last hemoglobin A1c 7.1), cerebrovascular occlusive disease, coronary artery disease, peripheral vascular disease, congestive heart failure, chronic pain syndrome, and prior amputation of his right great and second toes. He has a remote history of surgery on his right ankle and apparently as his legs have become increasingly more swollen and he has insisted upon wearing boots, the pressure and friction seem to have opened up wounds along the old scar line. In addition, he has a large callus on the third metatarsal head, related to his acquired foot deformity. He sees a podiatrist who periodically shaves this callus down and he was also recommended orthotics, but the patient has refused them because he likes his boots. There is an area of discoloration in the lower portion of this callus, suspicious for an open wound. 11/14/2022: The ulcer on his third metatarsal head has closed. The lateral leg ulcers are both about the same size, but he says that they feel better. There is slough accumulation in both sites. Patient History Family History Cancer - Father,Mother,Siblings, Diabetes - Mother,Siblings, Heart Disease, Lung Disease - Father, No family history of Hereditary Spherocytosis, Hypertension, Kidney Disease, Seizures, Stroke, Thyroid Problems, Tuberculosis. Social History Former smoker - ended on 12/23/1970, Marital Status - Married, Alcohol Use - Never, Drug Use - No History, Caffeine Use - Daily. Medical History Eyes Denies history of Cataracts, Glaucoma, Optic Neuritis Ear/Nose/Mouth/Throat Denies history of Chronic sinus problems/congestion, Middle ear problems Respiratory Denies history of Aspiration, Asthma, Chronic Obstructive Pulmonary Disease (COPD), Pneumothorax,  Sleep Apnea, Tuberculosis Cardiovascular Patient has history of Congestive Heart Failure, Hypertension,  Peripheral Venous Disease Endocrine Patient has history of Type II Diabetes Musculoskeletal Patient has history of Osteoarthritis Neurologic Patient has history of Neuropathy Oncologic Denies history of Received Chemotherapy, Received Radiation Psychiatric Denies history of Anorexia/bulimia, Confinement Anxiety Medical A Surgical History Notes nd Cardiovascular cardiomyopathy hyperlipidemia hypokalemia stroke Genitourinary orchitis Objective Constitutional Slightly hypertensive. no acute distress. Vitals Time Taken: 10:23 AM, Height: 74 in, Weight: 252 lbs, BMI: 32.4, Temperature: 97.3 F, Pulse: 88 bpm, Respiratory Rate: 22 breaths/min, Blood Pressure: 146/88 mmHg, Capillary Blood Glucose: 210 mg/dl. Respiratory Normal work of breathing on room air. General Notes: 11/14/2022: The ulcer on his third metatarsal head has closed. The lateral leg ulcers are both about the same size, but he says that they feel better. There is slough accumulation in both sites. Integumentary (Hair, Skin) Wound #1 status is Open. Original cause of wound was Shear/Friction. The date acquired was: 09/16/2022. The wound has been in treatment 1 weeks. The wound is located on the Right,Lateral Lower Leg. The wound measures 5.5cm length x 1.3cm width x 0.3cm depth; 5.616cm^2 area and 1.685cm^3 volume. There is Fat Layer (Subcutaneous Tissue) exposed. There is no tunneling or undermining noted. There is a medium amount of serous drainage noted. Foul odor after cleansing was noted. The wound margin is distinct with the outline attached to the wound base. There is medium (34-66%) pink, pale granulation within the wound bed. There is a medium (34-66%) amount of necrotic tissue within the wound bed including Eschar and Adherent Slough. The periwound skin appearance exhibited: Scarring. The periwound skin appearance did not exhibit: Dry/Scaly, Rubor. Periwound temperature was noted as No Abnormality. Wound #2 status is  Open. Original cause of wound was Shear/Friction. The date acquired was: 09/16/2022. The wound has been in treatment 1 weeks. The wound is located on the Right,Lateral Ankle. The wound measures 1.3cm length x 0.6cm width x 0.2cm depth; 0.613cm^2 area and 0.123cm^3 volume. There is Fat Layer (Subcutaneous Tissue) exposed. There is no tunneling or undermining noted. There is a medium amount of serous drainage noted. Foul odor after cleansing was noted. The wound margin is distinct with the outline attached to the wound base. There is medium (34-66%) pink, pale granulation within the wound bed. There is a medium (34-66%) amount of necrotic tissue within the wound bed including Adherent Slough. The periwound skin appearance had no abnormalities noted for color. The periwound skin appearance exhibited: Scarring. The periwound skin appearance did not exhibit: Dry/Scaly, Maceration. Periwound temperature was noted as No Abnormality. Wound #3 status is Healed - Epithelialized. Original cause of wound was Pressure Injury. The date acquired was: 09/16/2022. The wound has been in treatment 1 weeks. The wound is located on the Right Metatarsal head second. The wound measures 0cm length x 0cm width x 0cm depth; 0cm^2 area and 0cm^3 volume. [name] ([id number]) [id number] Page 8 of 11 There is no tunneling or undermining noted. There is a none present amount of drainage noted. The wound margin is distinct with the outline attached to the wound base. There is no granulation within the wound bed. There is no necrotic tissue within the wound bed. The periwound skin appearance had no abnormalities noted for color. The periwound skin appearance exhibited: Callus, Dry/Scaly. The periwound skin appearance did not exhibit: Maceration. Periwound temperature was noted as No Abnormality. Assessment Active Problems ICD-10 Non-pressure chronic ulcer of other part of right lower leg with  unspecified severity  Non-pressure chronic ulcer of other part of right foot with fat layer exposed Non-pressure chronic ulcer of right ankle with fat layer exposed Type 2 diabetes mellitus with other skin ulcer Type 2 diabetes mellitus with foot ulcer Chronic systolic (congestive) heart failure Peripheral vascular disease, unspecified Type 2 diabetes mellitus with diabetic polyneuropathy Cerebral infarction, unspecified Essential (primary) hypertension Chronic pain syndrome Procedures Wound #1 Pre-procedure diagnosis of Wound #1 is a Diabetic Wound/Ulcer of the Lower Extremity located on the Right,Lateral Lower Leg .Severity of Tissue Pre Debridement is: Fat layer exposed. There was a Excisional Skin/Subcutaneous Tissue Debridement with a total area of 7.15 sq cm performed by [name], MD. With the following instrument(s): Curette to remove Viable and Non-Viable tissue/material. Material removed includes Subcutaneous Tissue and Slough and after achieving pain control using Lidocaine 4% T opical Solution. No specimens were taken. A time out was conducted at 10:46, prior to the start of the procedure. A Minimum amount of bleeding was controlled with Pressure. The procedure was tolerated well. Post Debridement Measurements: 5.5cm length x 1.3cm width x 0.3cm depth; 1.685cm^3 volume. Character of Wound/Ulcer Post Debridement requires further debridement. Severity of Tissue Post Debridement is: Fat layer exposed. Post procedure Diagnosis Wound #1: Same as Pre-Procedure General Notes: Scribed for Dr. Celine Ahr by Blanche East, RN. Wound #2 Pre-procedure diagnosis of Wound #2 is a Diabetic Wound/Ulcer of the Lower Extremity located on the Right,Lateral Ankle .Severity of Tissue Pre Debridement is: Fat layer exposed. There was a Excisional Skin/Subcutaneous Tissue Debridement with a total area of 0.78 sq cm performed by [name], MD. With the following instrument(s): Curette to remove  Viable and Non-Viable tissue/material. Material removed includes Subcutaneous Tissue and Slough and after achieving pain control using Lidocaine 4% T opical Solution. No specimens were taken. A time out was conducted at 10:46, prior to the start of the procedure. A Minimum amount of bleeding was controlled with Pressure. The procedure was tolerated well. Post Debridement Measurements: 1.3cm length x 0.6cm width x 0.2cm depth; 0.123cm^3 volume. Character of Wound/Ulcer Post Debridement requires further debridement. Severity of Tissue Post Debridement is: Fat layer exposed. Post procedure Diagnosis Wound #2: Same as Pre-Procedure General Notes: Scribed for Dr. Celine Ahr by Blanche East, RN. Plan Follow-up Appointments: Return Appointment in 1 week. - Dr. Celine Ahr Rm 4 Anesthetic: Wound #1 Right,Lateral Lower Leg: (In clinic) Topical Lidocaine 5% applied to wound bed - Prior to debridement Wound #2 Right,Lateral Ankle: (In clinic) Topical Lidocaine 5% applied to wound bed - Prior to debridement Wound #3 Right Metatarsal head second: (In clinic) Topical Lidocaine 5% applied to wound bed - Prior to debridement Bathing/ Shower/ Hygiene: May shower with protection but do not get wound dressing(s) wet. Protect dressing(s) with water repellant cover (for example, large plastic bag) or a cast cover and may then take shower. - May purchase cast protector at [facility], [facility], or [facility] Edema Control - Lymphedema / SCD / Other: Elevate legs to the level of the heart or above for 30 minutes daily and/or when sitting for 3-4 times a day throughout the day. Avoid standing for long periods of time. WOUND #1: - Lower Leg Wound Laterality: Right, Lateral Cleanser: Soap and Water Discharge Instructions: May shower and wash wound with dial antibacterial soap and water prior to dressing change. Cleanser: Wound Cleanser Discharge Instructions: Cleanse the wound with wound cleanser prior to applying a clean dressing  using gauze sponges, not tissue or cotton balls. Peri-Wound Care: Sween Lotion (Moisturizing lotion) Discharge Instructions: Apply moisturizing  lotion as directed Prim Dressing: [place] Dressing 2x2 (in/in) ary Discharge Instructions: Apply to wound bed as instructed Secondary Dressing: Zetuvit Plus 4x8 in Discharge Instructions: Apply over primary dressing as directed. Secured With: Child psychotherapist, Sterile 2x75 (in/in) Discharge Instructions: Secure with stretch gauze as directed. [name] ([id number]) [id number] Page 9 of 11 Secured With: Transpore Surgical T ape, 2x10 (in/yd) Discharge Instructions: Secure dressing with tape as directed. Com pression Wrap: ThreePress (3 layer compression wrap) Discharge Instructions: Apply three layer compression as directed. WOUND #2: - Ankle Wound Laterality: Right, Lateral Cleanser: Soap and Water Discharge Instructions: May shower and wash wound with dial antibacterial soap and water prior to dressing change. Cleanser: Wound Cleanser Discharge Instructions: Cleanse the wound with wound cleanser prior to applying a clean dressing using gauze sponges, not tissue or cotton balls. Peri-Wound Care: Sween Lotion (Moisturizing lotion) Discharge Instructions: Apply moisturizing lotion as directed Prim Dressing: [place] Dressing 2x2 (in/in) ary Discharge Instructions: Apply to wound bed as instructed Secondary Dressing: Zetuvit Plus 4x8 in Discharge Instructions: Apply over primary dressing as directed. Secured With: Child psychotherapist, Sterile 2x75 (in/in) Discharge Instructions: Secure with stretch gauze as directed. Secured With: Transpore Surgical T ape, 2x10 (in/yd) Discharge Instructions: Secure dressing with tape as directed. Com pression Wrap: ThreePress (3 layer compression wrap) Discharge Instructions: Apply three layer compression as directed. WOUND #3: - Metatarsal  head second Wound Laterality: Right Cleanser: Soap and Water Discharge Instructions: May shower and wash wound with dial antibacterial soap and water prior to dressing change. Cleanser: Wound Cleanser Discharge Instructions: Cleanse the wound with wound cleanser prior to applying a clean dressing using gauze sponges, not tissue or cotton balls. Peri-Wound Care: Sween Lotion (Moisturizing lotion) Discharge Instructions: Apply moisturizing lotion as directed Prim Dressing: [place] Dressing 2x2 (in/in) ary Discharge Instructions: Apply to wound bed as instructed Secondary Dressing: Optifoam Non-Adhesive Dressing, 4x4 in Discharge Instructions: Apply over primary dressing as directed. Secondary Dressing: Zetuvit Plus 4x8 in Discharge Instructions: Apply over primary dressing as directed. Secured With: Child psychotherapist, Sterile 2x75 (in/in) Discharge Instructions: Secure with stretch gauze as directed. Secured With: Transpore Surgical T ape, 2x10 (in/yd) Discharge Instructions: Secure dressing with tape as directed. Com pression Wrap: ThreePress (3 layer compression wrap) Discharge Instructions: Apply three layer compression as directed. 11/14/2022: The ulcer on his third metatarsal head has closed. The lateral leg ulcers are both about the same size, but he says that they feel better. There is slough accumulation in both sites. I used a curette to debride slough and nonviable subcutaneous tissue from the lateral leg wounds. We will continue silver alginate and 3 layer compression. We are also going to continue to use a foam donut on his foot given the acquired deformity at the third metatarsal head that has led to the previous breakdown. Follow-up in 1 week. Electronic Signature(s) Signed: 11/14/2022 12:30:26 PM By: [name] MD FACS Signed: 11/14/2022 4:55:35 PM By: Blanche East RN Previous Signature: 11/14/2022 10:54:26 AM Version By: [name] MD  FACS Entered By: Blanche East on 11/14/2022 11:20:17 -------------------------------------------------------------------------------- HxROS Details Patient Name: Date of Service: [name], [name] 11/14/2022 10:15 A M Medical Record Number: [id number] Patient Account Number: [id number] Date of Birth/Sex: Treating RN: [birth year] (67 y.o. M) Primary Care Provider: Kathyrn Drown Other Clinician: Referring Provider: Treating Provider/Extender: Alean Rinne in Treatment: 1 Eyes Medical History: Negative for: Cataracts; Glaucoma; Optic Neuritis Ear/Nose/Mouth/Throat Medical History: Negative for:  Chronic sinus problems/congestion; Middle ear problems [name] ([id number]) [phone].pdf Page 10 of 11 Respiratory Medical History: Negative for: Aspiration; Asthma; Chronic Obstructive Pulmonary Disease (COPD); Pneumothorax; Sleep Apnea; Tuberculosis Cardiovascular Medical History: Positive for: Congestive Heart Failure; Hypertension; Peripheral Venous Disease Past Medical History Notes: cardiomyopathy hyperlipidemia hypokalemia stroke Endocrine Medical History: Positive for: Type II Diabetes Time with diabetes: 20 years Treated with: Insulin, Oral agents Genitourinary Medical History: Past Medical History Notes: orchitis Musculoskeletal Medical History: Positive for: Osteoarthritis Neurologic Medical History: Positive for: Neuropathy Oncologic Medical History: Negative for: Received Chemotherapy; Received Radiation Psychiatric Medical History: Negative for: Anorexia/bulimia; Confinement Anxiety Immunizations Pneumococcal Vaccine: Received Pneumococcal Vaccination: Yes Received Pneumococcal Vaccination On or After 60th Birthday: Yes Implantable Devices No devices added Family and Social History Cancer: Yes - Father,Mother,Siblings; Diabetes: Yes - Mother,Siblings; Heart Disease: Yes; Hereditary Spherocytosis:  No; Hypertension: No; Kidney Disease: No; Lung Disease: Yes - Father; Seizures: No; Stroke: No; Thyroid Problems: No; Tuberculosis: No; Former smoker - ended on 12/23/1970; Marital Status - Married; Alcohol Use: Never; Drug Use: No History; Caffeine Use: Daily; Financial Concerns: No; Food, Clothing or Shelter Needs: No; Support System Lacking: No; Transportation Concerns: No Electronic Signature(s) Signed: 11/14/2022 12:30:26 PM By: [name] MD FACS Entered By: [name] on 11/14/2022 10:53:07 [name] ([id number]) [phone].pdf Page 11 of 11 -------------------------------------------------------------------------------- SuperBill Details Patient Name: Date of Service: [name] 11/14/2022 Medical Record Number: [id number] Patient Account Number: [id number] Date of Birth/Sex: Treating RN: 1955-11-10 (67 y.o. M) Primary Care Provider: Kathyrn Drown Other Clinician: Referring Provider: Treating Provider/Extender: Alean Rinne in Treatment: 1 Diagnosis Coding ICD-10 Codes Code Description [phone] Non-pressure chronic ulcer of other part of right lower leg with unspecified severity L97.512 Non-pressure chronic ulcer of other part of right foot with fat layer exposed L97.312 Non-pressure chronic ulcer of right ankle with fat layer exposed E11.622 Type 2 diabetes mellitus with other skin ulcer E11.621 Type 2 diabetes mellitus with foot ulcer D42.87 Chronic systolic (congestive) heart failure I73.9 Peripheral vascular disease, unspecified E11.42 Type 2 diabetes mellitus with diabetic polyneuropathy I63.9 Cerebral infarction, unspecified I10 Essential (primary) hypertension G89.4 Chronic pain syndrome Facility Procedures : CPT4 Code: [id number] Description: 20355 - DEB SUBQ TISSUE 20 SQ CM/< ICD-10 Diagnosis Description L97.819 Non-pressure chronic ulcer of other part of right lower leg with unspecified se  L97.312 Non-pressure chronic ulcer of right ankle with fat layer exposed Modifier: verity Quantity: 1 Physician Procedures : CPT4 Code Description Modifier [id number] - WC PHYS LEVEL 3 - EST PT 25 ICD-10 Diagnosis Description L97.819 Non-pressure chronic ulcer of other part of right lower leg with unspecified severity L97.312 Non-pressure chronic ulcer of right ankle with  fat layer exposed E11.622 Type 2 diabetes mellitus with other skin ulcer W80.32 Chronic systolic (congestive) heart failure Quantity: 1 : [id number] 11042 - WC PHYS SUBQ TISS 20 SQ CM ICD-10 Diagnosis Description L97.819 Non-pressure chronic ulcer of other part of right lower leg with unspecified severity L97.312 Non-pressure chronic ulcer of right ankle with fat layer exposed Quantity: 1 Electronic Signature(s) Signed: 11/14/2022 10:55:38 AM By: [name] MD FACS Previous Signature: 11/14/2022 10:55:17 AM Version By: [name] MD FACS Entered By: [name] on 11/14/2022 10:55:38

## 2022-11-14 NOTE — Progress Notes (Addendum)
CAPONE, ZAWISTOWSKI ([id number]) [id number] Page 1 of 8 Visit Report for 11/14/2022 Arrival Information Details Patient Name: Date of Service: [name], [name] 11/14/2022 10:15 A M Medical Record Number: [id number] Patient Account Number: [id number] Date of Birth/Sex: Treating RN: 1956-04-13 (67 y.o. [name] Primary Care [name]: [name] Other Clinician: Referring [name]: Treating [name]/Extender: Alean Rinne in Treatment: 1 Visit Information History Since Last Visit All ordered tests and consults were completed: No Patient Arrived: Ambulatory Added or deleted any medications: No Arrival Time: 10:22 Any new allergies or adverse reactions: No Accompanied By: wife Had a fall or experienced change in No Transfer Assistance: None activities of daily living that may affect Patient Identification Verified: Yes risk of falls: Secondary Verification Process Completed: Yes Signs or symptoms of abuse/neglect since last visito No Patient Requires Transmission-Based Precautions: No Hospitalized since last visit: No Patient Has Alerts: Yes Implantable device outside of the clinic excluding No Patient Alerts: ABI RLE [place] cellular tissue based products placed in the center since last visit: Has Dressing in Place as Prescribed: Yes Has Compression in Place as Prescribed: Yes Pain Present Now: No Electronic Signature(s) Signed: 11/14/2022 4:46:33 PM By: Adline Peals Entered By: Adline Peals on 11/14/2022 10:23:26 -------------------------------------------------------------------------------- Lower Extremity Assessment Details Patient Name: Date of Service: [name] 11/14/2022 10:15 A M Medical Record Number: [id number] Patient Account Number: [id number] Date of Birth/Sex: Treating RN: 03-11-1956 (67 y.o. [name] Primary Care [name]: [name] Other Clinician: Referring  [name]: Treating [name]/Extender: Kristine Royal, Scott A Weeks in Treatment: 1 Edema Assessment Assessed: [Left: No] [Right: No] [Left: Edema] [Right: :] Calf Left: Right: Point of Measurement: From Medial Instep 40 cm Ankle Left: Right: Point of Measurement: From Medial Instep 28.8 cm Vascular Assessment Pulses: Dorsalis Pedis Palpable: [Right:Yes] Electronic Signature(s) Signed: 11/14/2022 4:46:33 PM By: Adline Peals Entered By: Adline Peals on 11/14/2022 10:31:49 [name] ([id number].pdf Page 2 of 8 -------------------------------------------------------------------------------- Multi Wound Chart Details Patient Name: Date of Service: [name], [name] 11/14/2022 10:15 A M Medical Record Number: [id number] Patient Account Number: [id number] Date of Birth/Sex: Treating RN: 1956-05-17 (66 y.o. M) Primary Care [name]: [name] Other Clinician: Referring [name]: Treating [name]/Extender: Kristine Royal, Scott A Weeks in Treatment: 1 Vital Signs Height(in): 74 Capillary Blood Glucose(mg/dl): 210 Weight(lbs): 252 Pulse(bpm): 44 Body Mass Index(BMI): 32.4 Blood Pressure(mmHg): 146/88 Temperature(F): 97.3 Respiratory Rate(breaths/min): 22 [1:Photos:] Right, Lateral Lower Leg Right, Lateral Ankle Right Metatarsal head second Wound Location: Shear/Friction Shear/Friction Pressure Injury Wounding Event: Diabetic Wound/Ulcer of the Lower Diabetic Wound/Ulcer of the Lower Diabetic Wound/Ulcer of the Lower Primary Etiology: Extremity Extremity Extremity Congestive Heart Failure, Congestive Heart Failure, Congestive Heart Failure, Comorbid History: Hypertension, Peripheral Venous Hypertension, Peripheral Venous Hypertension, Peripheral Venous Disease, Type II Diabetes, Disease, Type II Diabetes, Disease, Type II Diabetes, Osteoarthritis, Neuropathy Osteoarthritis, Neuropathy  Osteoarthritis, Neuropathy 09/16/2022 09/16/2022 09/16/2022 Date Acquired: '1 1 1 '$ Weeks of Treatment: Open Open Open Wound Status: No No No Wound Recurrence: 5.5x1.3x0.3 1.3x0.6x0.2 0.1x0.1x0.1 Measurements L x W x D (cm) 5.616 0.613 0.008 A (cm) : rea 1.685 0.123 0.001 Volume (cm) : -32.40% 13.30% 95.90% % Reduction in A rea: -32.50% 12.80% 95.00% % Reduction in Volume: Grade 1 Grade 1 Grade 1 Classification: Medium Medium None Present Exudate A mount: Serous Serous N/A Exudate Type: amber amber N/A Exudate Color: Yes Yes No Foul Odor A Cleansing: fter No No N/A Odor A nticipated Due  to Product Use: Distinct, outline attached Distinct, outline attached Distinct, outline attached Wound Margin: Medium (34-66%) Medium (34-66%) None Present (0%) Granulation A mount: Pink, Pale Pink, Pale N/A Granulation Quality: Medium (34-66%) Medium (34-66%) None Present (0%) Necrotic A mount: Eschar, Adherent Slough Adherent Slough N/A Necrotic Tissue: Fat Layer (Subcutaneous Tissue): Yes Fat Layer (Subcutaneous Tissue): Yes Fascia: No Exposed Structures: Fascia: No Fascia: No Fat Layer (Subcutaneous Tissue): No Tendon: No Tendon: No Tendon: No Muscle: No Muscle: No Muscle: No Joint: No Joint: No Joint: No Bone: No Bone: No Bone: No Small (1-33%) Small (1-33%) Large (67-100%) Epithelialization: Scarring: Yes Scarring: Yes Callus: Yes Periwound Skin Texture: Dry/Scaly: No Maceration: No Dry/Scaly: Yes Periwound Skin Moisture: Dry/Scaly: No Maceration: No Rubor: No No Abnormalities Noted No Abnormalities Noted Periwound Skin Color: No Abnormality No Abnormality No Abnormality Temperature: Treatment Notes Electronic Signature(s) Signed: 11/14/2022 10:51:25 AM By: [name] MD FACS Entered By: [name] on 11/14/2022 10:51:25 [name] ([id number].pdf Page 3 of  8 -------------------------------------------------------------------------------- Multi-Disciplinary Care Plan Details Patient Name: Date of Service: [name], [name] 11/14/2022 10:15 A M Medical Record Number: [id number] Patient Account Number: [id number] Date of Birth/Sex: Treating RN: 1955-11-28 (67 y.o. [name] Primary Care [name]: [name] Other Clinician: Referring Jessenya Berdan: Treating Eulala Newcombe/Extender: Alean Rinne in Treatment: 1 Active Inactive Orientation to the Wound Care Program Nursing Diagnoses: Knowledge deficit related to the wound healing center program Goals: Patient/caregiver will verbalize understanding of the [place] Program Date Initiated: 11/07/2022 Target Resolution Date: 12/30/2022 Goal Status: Active Interventions: Provide education on orientation to the wound center Notes: Pressure Nursing Diagnoses: Knowledge deficit related to causes and risk factors for pressure ulcer development Knowledge deficit related to management of pressures ulcers Goals: Patient will remain free of pressure ulcers Date Initiated: 11/07/2022 Target Resolution Date: 12/15/2022 Goal Status: Active Interventions: Assess: immobility, friction, shearing, incontinence upon admission and as needed Assess offloading mechanisms upon admission and as needed Assess potential for pressure ulcer upon admission and as needed Provide education on pressure ulcers Notes: Wound/Skin Impairment Nursing Diagnoses: Impaired tissue integrity Goals: Ulcer/skin breakdown will have a volume reduction of 30% by week 4 Date Initiated: 11/07/2022 Target Resolution Date: 12/15/2022 Goal Status: Active Interventions: Assess patient/caregiver ability to obtain necessary supplies Assess patient/caregiver ability to perform ulcer/skin care regimen upon admission and as needed Assess ulceration(s) every visit Treatment Activities: Skin care  regimen initiated : 11/07/2022 Topical wound management initiated : 11/07/2022 Notes: Electronic Signature(s) Signed: 11/23/2022 7:34:56 AM By: Blanche East RN Entered By: Blanche East on 11/23/2022 07:34:55 [name] ([id number].pdf Page 4 of 8 -------------------------------------------------------------------------------- Pain Assessment Details Patient Name: Date of Service: [name] 11/14/2022 10:15 A M Medical Record Number: [id number] Patient Account Number: [id number] Date of Birth/Sex: Treating RN: 1955-11-28 (67 y.o. [name] Primary Care Nayelly Laughman: [name] Other Clinician: Referring Susano Cleckler: Treating Zhion Pevehouse/Extender: Alean Rinne in Treatment: 1 Active Problems Location of Pain Severity and Description of Pain Patient Has Paino No Site Locations Rate the pain. Current Pain Level: 0 Pain Management and Medication Current Pain Management: Electronic Signature(s) Signed: 11/14/2022 4:46:33 PM By: Adline Peals Entered By: Adline Peals on 11/14/2022 10:23:37 -------------------------------------------------------------------------------- Patient/Caregiver Education Details Patient Name: Date of Service: [name], [name]. 1/29/2024andnbsp10:15 [place] Record Number: [id number] Patient Account Number: [id number] Date of Birth/Gender: Treating RN: 04-01-1956 (67 y.o. [name] Primary Care Physician: Sallee Lange  A Other Clinician: Referring Physician: Treating Physician/Extender: Alean Rinne in Treatment: 1 Education Assessment Education Provided To: Patient Education Topics Provided Wound Debridement: Methods: Explain/Verbal Responses: Reinforcements needed, State content correctly Wound/Skin Impairment: Methods: Explain/Verbal Responses: Reinforcements needed, State content correctly Electronic Signature(s) Signed:  12/12/2022 4:55:53 PM By: Blanche East RN Entered By: Blanche East on 11/23/2022 07:35:10 [name] ([id number]) [phone].pdf Page 5 of 8 -------------------------------------------------------------------------------- Wound Assessment Details Patient Name: Date of Service: [name] 11/14/2022 10:15 A M Medical Record Number: [id number] Patient Account Number: [id number] Date of Birth/Sex: Treating RN: 1956/05/10 (67 y.o. [name] Primary Care Sherria Riemann: [name] Other Clinician: Referring Lory Galan: Treating Raylei Losurdo/Extender: Hart Robinsons A Weeks in Treatment: 1 Wound Status Wound Number: 1 Primary Diabetic Wound/Ulcer of the Lower Extremity Etiology: Wound Location: Right, Lateral Lower Leg Wound Open Wounding Event: Shear/Friction Status: Date Acquired: 09/16/2022 Comorbid Congestive Heart Failure, Hypertension, Peripheral Venous Weeks Of Treatment: 1 History: Disease, Type II Diabetes, Osteoarthritis, Neuropathy Clustered Wound: No Photos Wound Measurements Length: (cm) 5.5 Width: (cm) 1.3 Depth: (cm) 0.3 Area: (cm) 5.616 Volume: (cm) 1.685 % Reduction in Area: -32.4% % Reduction in Volume: -32.5% Epithelialization: Small (1-33%) Tunneling: No Undermining: No Wound Description Classification: Grade 1 Wound Margin: Distinct, outline attached Exudate Amount: Medium Exudate Type: Serous Exudate Color: amber Foul Odor After Cleansing: Yes Due to Product Use: No Slough/Fibrino Yes Wound Bed Granulation Amount: Medium (34-66%) Exposed Structure Granulation Quality: Pink, Pale Fascia Exposed: No Necrotic Amount: Medium (34-66%) Fat Layer (Subcutaneous Tissue) Exposed: Yes Necrotic Quality: Eschar, Adherent Slough Tendon Exposed: No Muscle Exposed: No Joint Exposed: No Bone Exposed: No Periwound Skin Texture Texture Color No Abnormalities Noted: No No Abnormalities Noted:  No Scarring: Yes Rubor: No Moisture Temperature / Pain No Abnormalities Noted: No Temperature: No Abnormality Dry / Scaly: No Electronic Signature(s) Signed: 11/14/2022 4:46:33 PM By: Adline Peals Entered By: Adline Peals on 11/14/2022 10:35:52 [name] ([id number].pdf Page 6 of 8 -------------------------------------------------------------------------------- Wound Assessment Details Patient Name: Date of Service: [name] 11/14/2022 10:15 A M Medical Record Number: [id number] Patient Account Number: [id number] Date of Birth/Sex: Treating RN: [birth year] (67 y.o. [name] Primary Care Branndon Tuite: [name] Other Clinician: Referring Ajanay Farve: Treating Baby Gieger/Extender: Hart Robinsons A Weeks in Treatment: 1 Wound Status Wound Number: 2 Primary Diabetic Wound/Ulcer of the Lower Extremity Etiology: Wound Location: Right, Lateral Ankle Wound Open Wounding Event: Shear/Friction Status: Date Acquired: 09/16/2022 Comorbid Congestive Heart Failure, Hypertension, Peripheral Venous Weeks Of Treatment: 1 History: Disease, Type II Diabetes, Osteoarthritis, Neuropathy Clustered Wound: No Photos Wound Measurements Length: (cm) 1.3 Width: (cm) 0.6 Depth: (cm) 0.2 Area: (cm) 0.613 Volume: (cm) 0.123 % Reduction in Area: 13.3% % Reduction in Volume: 12.8% Epithelialization: Small (1-33%) Tunneling: No Undermining: No Wound Description Classification: Grade 1 Wound Margin: Distinct, outline attached Exudate Amount: Medium Exudate Type: Serous Exudate Color: amber Foul Odor After Cleansing: Yes Due to Product Use: No Slough/Fibrino Yes Wound Bed Granulation Amount: Medium (34-66%) Exposed Structure Granulation Quality: Pink, Pale Fascia Exposed: No Necrotic Amount: Medium (34-66%) Fat Layer (Subcutaneous Tissue) Exposed: Yes Necrotic Quality: Adherent Slough Tendon Exposed:  No Muscle Exposed: No Joint Exposed: No Bone Exposed: No Periwound Skin Texture Texture Color No Abnormalities Noted: No No Abnormalities Noted: Yes Scarring: Yes Temperature / Pain Temperature: No Abnormality Moisture No Abnormalities Noted: No Dry / Scaly: No Maceration: No Electronic Signature(s) Signed: 11/14/2022 4:46:33 PM By: Adline Peals  Entered By: Adline Peals on 11/14/2022 10:36:10 Wound Assessment Details -------------------------------------------------------------------------------- [name] ([id number]) [id number] Page 7 of 8 Patient Name: Date of Service: [name], [name] 11/14/2022 10:15 A M Medical Record Number: [id number] Patient Account Number: [id number] Date of Birth/Sex: Treating RN: 18-Dec-1955 (67 y.o. [name] Primary Care Kwanza Cancelliere: [name] Other Clinician: Referring Nakshatra Klose: Treating Angelia Hazell/Extender: Kristine Royal, Scott A Weeks in Treatment: 1 Wound Status Wound Number: 3 Primary Diabetic Wound/Ulcer of the Lower Extremity Etiology: Wound Location: Right Metatarsal head second Wound Healed - Epithelialized Wounding Event: Pressure Injury Status: Date Acquired: 09/16/2022 Comorbid Congestive Heart Failure, Hypertension, Peripheral Venous Weeks Of Treatment: 1 History: Disease, Type II Diabetes, Osteoarthritis, Neuropathy Clustered Wound: No Photos Wound Measurements Length: (cm) 0 % Reduction in Area: 95.9% Width: (cm) 0 % Reduction in Volume: 95% Depth: (cm) 0 Epithelialization: Large (67-100%) Area: (cm) 0 Tunneling: No Volume: (cm) 0 Undermining: No Wound Description Classification: Grade 1 Foul Odor After Cleansing: No Wound Margin: Distinct, outline attached Slough/Fibrino No Exudate Amount: None Present Wound Bed Granulation Amount: None Present (0%) Exposed Structure Necrotic Amount: None Present (0%) Fascia Exposed: No Fat Layer (Subcutaneous  Tissue) Exposed: No Tendon Exposed: No Muscle Exposed: No Joint Exposed: No Bone Exposed: No Periwound Skin Texture Texture Color No Abnormalities Noted: No No Abnormalities Noted: Yes Callus: Yes Temperature / Pain Temperature: No Abnormality Moisture No Abnormalities Noted: No Dry / Scaly: Yes Maceration: No Treatment Notes Wound #3 (Metatarsal head second) Wound Laterality: Right Cleanser Peri-Wound Care Topical Primary Dressing Secondary Dressing Secured With RYLEY, BAYERL ([id number]) [phone].pdf Page 8 of 8 Compression Wrap Compression Stockings Add-Ons Electronic Signature(s) Signed: 11/14/2022 4:55:35 PM By: Blanche East RN Entered By: Blanche East on 11/14/2022 11:19:55 -------------------------------------------------------------------------------- Vitals Details Patient Name: Date of Service: [name], [name] 11/14/2022 10:15 A M Medical Record Number: [id number] Patient Account Number: [id number] Date of Birth/Sex: Treating RN: [birth year] (67 y.o. [name] Primary Care Kaleigh Spiegelman: [name] Other Clinician: Referring Mandy Peeks: Treating Voris Tigert/Extender: Monna Fam Weeks in Treatment: 1 Vital Signs Time Taken: 10:23 Temperature (F): 97.3 Height (in): 74 Pulse (bpm): 88 Weight (lbs): 252 Respiratory Rate (breaths/min): 22 Body Mass Index (BMI): 32.4 Blood Pressure (mmHg): 146/88 Capillary Blood Glucose (mg/dl): 210 Reference Range: 80 - 120 mg / dl Electronic Signature(s) Signed: 11/14/2022 4:46:33 PM By: Adline Peals Entered By: Adline Peals on 11/14/2022 10:25:35

## 2022-11-18 ENCOUNTER — Other Ambulatory Visit: Payer: Self-pay | Admitting: Family Medicine

## 2022-11-21 ENCOUNTER — Ambulatory Visit (HOSPITAL_BASED_OUTPATIENT_CLINIC_OR_DEPARTMENT_OTHER): Payer: Medicare HMO | Admitting: General Surgery

## 2022-11-23 ENCOUNTER — Encounter (HOSPITAL_BASED_OUTPATIENT_CLINIC_OR_DEPARTMENT_OTHER): Payer: Medicare HMO | Attending: General Surgery | Admitting: General Surgery

## 2022-11-23 ENCOUNTER — Other Ambulatory Visit: Payer: Medicare HMO | Admitting: *Deleted

## 2022-11-23 DIAGNOSIS — L97312 Non-pressure chronic ulcer of right ankle with fat layer exposed: Secondary | ICD-10-CM | POA: Diagnosis not present

## 2022-11-23 DIAGNOSIS — Z8673 Personal history of transient ischemic attack (TIA), and cerebral infarction without residual deficits: Secondary | ICD-10-CM | POA: Insufficient documentation

## 2022-11-23 DIAGNOSIS — G894 Chronic pain syndrome: Secondary | ICD-10-CM | POA: Diagnosis not present

## 2022-11-23 DIAGNOSIS — I5022 Chronic systolic (congestive) heart failure: Secondary | ICD-10-CM | POA: Diagnosis not present

## 2022-11-23 DIAGNOSIS — E1142 Type 2 diabetes mellitus with diabetic polyneuropathy: Secondary | ICD-10-CM | POA: Diagnosis not present

## 2022-11-23 DIAGNOSIS — I110 Hypertensive heart disease with heart failure: Secondary | ICD-10-CM | POA: Diagnosis not present

## 2022-11-23 DIAGNOSIS — L97812 Non-pressure chronic ulcer of other part of right lower leg with fat layer exposed: Secondary | ICD-10-CM | POA: Diagnosis not present

## 2022-11-23 DIAGNOSIS — E11621 Type 2 diabetes mellitus with foot ulcer: Secondary | ICD-10-CM | POA: Diagnosis present

## 2022-11-23 DIAGNOSIS — E1151 Type 2 diabetes mellitus with diabetic peripheral angiopathy without gangrene: Secondary | ICD-10-CM | POA: Diagnosis not present

## 2022-11-23 DIAGNOSIS — L97512 Non-pressure chronic ulcer of other part of right foot with fat layer exposed: Secondary | ICD-10-CM | POA: Insufficient documentation

## 2022-11-23 DIAGNOSIS — E11622 Type 2 diabetes mellitus with other skin ulcer: Secondary | ICD-10-CM | POA: Diagnosis not present

## 2022-11-24 NOTE — Progress Notes (Signed)
[name], [name] ([id number]) [id number] Page 1 of 11 Visit Report for 11/23/2022 Chief Complaint Document Details Patient Name: Date of Service: [name] 11/23/2022 1:30 PM Medical Record Number: [id number] Patient Account Number: [id number] Date of Birth/Sex: Treating RN: 08-27-1956 (67 y.o. [name] Primary Care Provider: Kathyrn Drown Other Clinician: Referring Provider: Treating Provider/Extender: Alean Rinne in Treatment: 2 Information Obtained from: Patient Chief Complaint Patients presents for treatment of an open diabetic ulcer on his right foot, as well as ulcers on his right lower leg and ankle Electronic Signature(s) Signed: 11/23/2022 2:52:32 PM By: [name] MD FACS Entered By: [name] on 11/23/2022 14:52:32 -------------------------------------------------------------------------------- Debridement Details Patient Name: Date of Service: [name], [name] 11/23/2022 1:30 PM Medical Record Number: [id number] Patient Account Number: [id number] Date of Birth/Sex: Treating RN: 1955/11/28 (67 y.o. [name] Primary Care Provider: Kathyrn Drown Other Clinician: Referring Provider: Treating Provider/Extender: Alean Rinne in Treatment: 2 Debridement Performed for Assessment: Wound #1 Right,Lateral Lower Leg Performed By: Physician [name], MD Debridement Type: Debridement Severity of Tissue Pre Debridement: Fat layer exposed Level of Consciousness (Pre-procedure): Awake and Alert Pre-procedure Verification/Time Out Yes - 14:25 Taken: Start Time: 14:26 Pain Control: Lidocaine 4% T opical Solution T Area Debrided (L x W): otal 5.7 (cm) x 1.2 (cm) = 6.84 (cm) Tissue and other material debrided: Viable, Non-Viable, Slough, Subcutaneous, Slough Level: Skin/Subcutaneous Tissue Debridement Description: Excisional Instrument: Curette Bleeding:  Minimum Hemostasis Achieved: Pressure Procedural Pain: 0 Post Procedural Pain: 0 Response to Treatment: Procedure was tolerated well Level of Consciousness (Post- Awake and Alert procedure): Post Debridement Measurements of Total Wound Length: (cm) 5.7 Width: (cm) 1.2 Depth: (cm) 0.5 Volume: (cm) 2.686 Character of Wound/Ulcer Post Debridement: Improved Severity of Tissue Post Debridement: Fat layer exposed [name] ([id number]) [id number] Page 2 of 11 Post Procedure Diagnosis Same as Pre-procedure Notes Scribed for Dr. Celine Ahr by Baruch Gouty, RN Electronic Signature(s) Signed: 11/23/2022 5:26:12 PM By: [name] MD FACS Signed: 11/23/2022 5:56:04 PM By: Baruch Gouty RN, BSN Entered By: Baruch Gouty on 11/23/2022 14:28:29 -------------------------------------------------------------------------------- Debridement Details Patient Name: Date of Service: [name], [name] 11/23/2022 1:30 PM Medical Record Number: [id number] Patient Account Number: [id number] Date of Birth/Sex: Treating RN: Jul 01, 1956 (67 y.o. [name] Primary Care Provider: Kathyrn Drown Other Clinician: Referring Provider: Treating Provider/Extender: Alean Rinne in Treatment: 2 Debridement Performed for Assessment: Wound #2 Right,Lateral Ankle Performed By: Physician [name], MD Debridement Type: Debridement Severity of Tissue Pre Debridement: Fat layer exposed Level of Consciousness (Pre-procedure): Awake and Alert Pre-procedure Verification/Time Out Yes - 14:25 Taken: Start Time: 14:26 Pain Control: Lidocaine 4% T opical Solution T Area Debrided (L x W): otal 2 (cm) x 0.6 (cm) = 1.2 (cm) Tissue and other material debrided: Viable, Non-Viable, Slough, Subcutaneous, Slough Level: Skin/Subcutaneous Tissue Debridement Description: Excisional Instrument: Curette Bleeding: Minimum Hemostasis Achieved:  Pressure Procedural Pain: 0 Post Procedural Pain: 0 Response to Treatment: Procedure was tolerated well Level of Consciousness (Post- Awake and Alert procedure): Post Debridement Measurements of Total Wound Length: (cm) 2 Width: (cm) 0.6 Depth: (cm) 0.2 Volume: (cm) 0.188 Character of Wound/Ulcer Post Debridement: Improved Severity of Tissue Post Debridement: Fat layer exposed Post Procedure Diagnosis Same as Pre-procedure Notes Scribed for Dr. Celine Ahr by Baruch Gouty, RN Electronic Signature(s) Signed: 11/23/2022 5:26:12 PM By: [name] MD FACS Signed: 11/23/2022 5:56:04 PM By: Baruch Gouty RN,  BSN Entered By: Baruch Gouty on 11/23/2022 14:29:14 [name] ([id number]) [id number] Page 3 of 11 -------------------------------------------------------------------------------- HPI Details Patient Name: Date of Service: [place] RE, [name] 11/23/2022 1:30 PM Medical Record Number: [id number] Patient Account Number: [id number] Date of Birth/Sex: Treating RN: 12-Jul-1956 (67 y.o. [name] Primary Care Provider: Kathyrn Drown Other Clinician: Referring Provider: Treating Provider/Extender: Alean Rinne in Treatment: 2 History of Present Illness HPI Description: ADMISSION 11/07/2022 This is a 67 year old man with a past medical history significant for type 2 diabetes (last hemoglobin A1c 7.1), cerebrovascular occlusive disease, coronary artery disease, peripheral vascular disease, congestive heart failure, chronic pain syndrome, and prior amputation of his right great and second toes. He has a remote history of surgery on his right ankle and apparently as his legs have become increasingly more swollen and he has insisted upon wearing boots, the pressure and friction seem to have opened up wounds along the old scar line. In addition, he has a large callus on the third metatarsal head, related to his acquired  foot deformity. He sees a podiatrist who periodically shaves this callus down and he was also recommended orthotics, but the patient has refused them because he likes his boots. There is an area of discoloration in the lower portion of this callus, suspicious for an open wound. 11/14/2022: The ulcer on his third metatarsal head has closed. The lateral leg ulcers are both about the same size, but he says that they feel better. There is slough accumulation in both sites. 11/23/2022: The ulcer on his foot remains closed. The lateral leg ulcers are still unchanged in size, but are a bit cleaner, just with some slough accumulation. Electronic Signature(s) Signed: 11/23/2022 2:53:06 PM By: [name] MD FACS Entered By: [name] on 11/23/2022 14:53:05 -------------------------------------------------------------------------------- Physical Exam Details Patient Name: Date of Service: [name], [name] 11/23/2022 1:30 PM Medical Record Number: [id number] Patient Account Number: [id number] Date of Birth/Sex: Treating RN: January 22, 1956 (67 y.o. [name] Primary Care Provider: Kathyrn Drown Other Clinician: Referring Provider: Treating Provider/Extender: Kristine Royal, Scott A Weeks in Treatment: 2 Constitutional . . . . no acute distress. Respiratory Normal work of breathing on room air. Notes 11/23/2022: The ulcer on his foot remains closed. The lateral leg ulcers are still unchanged in size, but are a bit cleaner, just with some slough accumulation. Electronic Signature(s) Signed: 11/23/2022 2:53:33 PM By: [name] MD FACS Entered By: [name] on 11/23/2022 14:53:33 Physician Orders Details -------------------------------------------------------------------------------- [name] ([id number]) [id number] Page 4 of 11 Patient Name: Date of Service: [place] RE, [name] 11/23/2022 1:30 PM Medical Record Number:  [id number] Patient Account Number: [id number] Date of Birth/Sex: Treating RN: 1956-06-05 (67 y.o. [name] Primary Care Provider: Kathyrn Drown Other Clinician: Referring Provider: Treating Provider/Extender: Alean Rinne in Treatment: 2 Verbal / Phone Orders: No Diagnosis Coding ICD-10 Coding Code Description L97.819 Non-pressure chronic ulcer of other part of right lower leg with unspecified severity L97.312 Non-pressure chronic ulcer of right ankle with fat layer exposed E11.622 Type 2 diabetes mellitus with other skin ulcer G38.75 Chronic systolic (congestive) heart failure I73.9 Peripheral vascular disease, unspecified E11.42 Type 2 diabetes mellitus with diabetic polyneuropathy I63.9 Cerebral infarction, unspecified I10 Essential (primary) hypertension G89.4 Chronic pain syndrome Follow-up Appointments ppointment in 1 week. - Dr. Celine Ahr Rm 2 Return A Thursday 2/15 @ 2:00 pm Anesthetic Wound #1 Right,Lateral Lower Leg (In clinic) Topical  Lidocaine 4% applied to wound bed Wound #2 Right,Lateral Ankle (In clinic) Topical Lidocaine 4% applied to wound bed Bathing/ Shower/ Hygiene May shower with protection but do not get wound dressing(s) wet. Protect dressing(s) with water repellant cover (for example, large plastic bag) or a cast cover and may then take shower. - May purchase cast protector at [facility], [facility], or [facility] Edema Control - Lymphedema / SCD / Other Right Lower Extremity Elevate legs to the level of the heart or above for 30 minutes daily and/or when sitting for 3-4 times a day throughout the day. Avoid standing for long periods of time. Wound Treatment Wound #1 - Lower Leg Wound Laterality: Right, Lateral Cleanser: Soap and Water 1 x Per Week Discharge Instructions: May shower and wash wound with dial antibacterial soap and water prior to dressing change. Cleanser: Wound Cleanser 1 x Per Week Discharge Instructions: Cleanse  the wound with wound cleanser prior to applying a clean dressing using gauze sponges, not tissue or cotton balls. Peri-Wound Care: Sween Lotion (Moisturizing lotion) 1 x Per Week Discharge Instructions: Apply moisturizing lotion as directed Prim Dressing: Sorbalgon AG Dressing 2x2 (in/in) 1 x Per Week ary Discharge Instructions: Apply to wound bed as instructed Secondary Dressing: Zetuvit Plus 4x8 in 1 x Per Week Discharge Instructions: Apply over primary dressing as directed. Secured With: Transpore Surgical Tape, 2x10 (in/yd) 1 x Per Week Discharge Instructions: Secure dressing with tape as directed. Compression Wrap: ThreePress (3 layer compression wrap) 1 x Per Week Discharge Instructions: Apply three layer compression as directed. Wound #2 - Ankle Wound Laterality: Right, Lateral Cleanser: Soap and Water 1 x Per Week Discharge Instructions: May shower and wash wound with dial antibacterial soap and water prior to dressing change. Cleanser: Wound Cleanser 1 x Per Week Discharge Instructions: Cleanse the wound with wound cleanser prior to applying a clean dressing using gauze sponges, not tissue or cotton balls. Peri-Wound Care: Sween Lotion (Moisturizing lotion) 1 x Per Week Discharge Instructions: Apply moisturizing lotion as directed KASYN, ROLPH ([id number]) [id number] Page 5 of 11 Prim Dressing: Sorbalgon AG Dressing 2x2 (in/in) 1 x Per Week ary Discharge Instructions: Apply to wound bed as instructed Secondary Dressing: Zetuvit Plus 4x8 in 1 x Per Week Discharge Instructions: Apply over primary dressing as directed. Secured With: Transpore Surgical Tape, 2x10 (in/yd) 1 x Per Week Discharge Instructions: Secure dressing with tape as directed. Compression Wrap: ThreePress (3 layer compression wrap) 1 x Per Week Discharge Instructions: Apply three layer compression as directed. Patient Medications llergies: metformin, Statins-HMG-CoA Reductase  Inhibitors, Vicodin A Notifications Medication Indication Start End prior to debridement 11/23/2022 lidocaine DOSE topical 4 % cream - cream topical Electronic Signature(s) Signed: 11/23/2022 5:26:12 PM By: [name] MD FACS Entered By: [name] on 11/23/2022 14:53:47 -------------------------------------------------------------------------------- Problem List Details Patient Name: Date of Service: [name], [name] 11/23/2022 1:30 PM Medical Record Number: [id number] Patient Account Number: [id number] Date of Birth/Sex: Treating RN: 02/02/1956 (67 y.o. [name] Primary Care Provider: Kathyrn Drown Other Clinician: Referring Provider: Treating Provider/Extender: Alean Rinne in Treatment: 2 Active Problems ICD-10 Encounter Code Description Active Date MDM Diagnosis L97.819 Non-pressure chronic ulcer of other part of right lower leg with unspecified 11/07/2022 No Yes severity L97.312 Non-pressure chronic ulcer of right ankle with fat layer exposed 11/07/2022 No Yes E11.622 Type 2 diabetes mellitus with other skin ulcer 11/07/2022 No Yes I96.78 Chronic systolic (congestive) heart failure 11/07/2022 No Yes I73.9 Peripheral vascular disease, unspecified 11/07/2022  No Yes E11.42 Type 2 diabetes mellitus with diabetic polyneuropathy 11/07/2022 No Yes HERSCHELL, VIRANI ([id number]) [id number] Page 6 of 11 I63.9 Cerebral infarction, unspecified 11/07/2022 No Yes I10 Essential (primary) hypertension 11/07/2022 No Yes G89.4 Chronic pain syndrome 11/07/2022 No Yes Inactive Problems ICD-10 Code Description Active Date Inactive Date L97.512 Non-pressure chronic ulcer of other part of right foot with fat layer exposed 11/07/2022 11/07/2022 G25.638 Type 2 diabetes mellitus with foot ulcer 11/07/2022 11/07/2022 Resolved Problems Electronic Signature(s) Signed: 11/23/2022 2:52:08 PM By: [name] MD FACS Entered By: [name] on 11/23/2022 14:52:08 -------------------------------------------------------------------------------- Progress Note Details Patient Name: Date of Service: [name], [name] 11/23/2022 1:30 PM Medical Record Number: [id number] Patient Account Number: [id number] Date of Birth/Sex: Treating RN: [birth year] (67 y.o. [name] Primary Care Provider: Kathyrn Drown Other Clinician: Referring Provider: Treating Provider/Extender: Alean Rinne in Treatment: 2 Subjective Chief Complaint Information obtained from Patient Patients presents for treatment of an open diabetic ulcer on his right foot, as well as ulcers on his right lower leg and ankle History of Present Illness (HPI) ADMISSION 11/07/2022 This is a 67 year old man with a past medical history significant for type 2 diabetes (last hemoglobin A1c 7.1), cerebrovascular occlusive disease, coronary artery disease, peripheral vascular disease, congestive heart failure, chronic pain syndrome, and prior amputation of his right great and second toes. He has a remote history of surgery on his right ankle and apparently as his legs have become increasingly more swollen and he has insisted upon wearing boots, the pressure and friction seem to have opened up wounds along the old scar line. In addition, he has a large callus on the third metatarsal head, related to his acquired foot deformity. He sees a podiatrist who periodically shaves this callus down and he was also recommended orthotics, but the patient has refused them because he likes his boots. There is an area of discoloration in the lower portion of this callus, suspicious for an open wound. 11/14/2022: The ulcer on his third metatarsal head has closed. The lateral leg ulcers are both about the same size, but he says that they feel better. There is slough accumulation in both sites. 11/23/2022: The ulcer on his foot remains closed. The lateral leg ulcers  are still unchanged in size, but are a bit cleaner, just with some slough accumulation. Patient History Family History Cancer - Father,Mother,Siblings, Diabetes - Mother,Siblings, Heart Disease, Lung Disease - Father, No family history of Hereditary Spherocytosis, Hypertension, Kidney Disease, Seizures, Stroke, Thyroid Problems, Tuberculosis. Social History Former smoker - ended on 12/23/1970, Marital Status - Married, Alcohol Use - Never, Drug Use - No History, Caffeine Use - Daily. Medical History IDO, WOLLMAN ([id number]) [id number] Page 7 of 11 Eyes Denies history of Cataracts, Glaucoma, Optic Neuritis Ear/Nose/Mouth/Throat Denies history of Chronic sinus problems/congestion, Middle ear problems Respiratory Denies history of Aspiration, Asthma, Chronic Obstructive Pulmonary Disease (COPD), Pneumothorax, Sleep Apnea, Tuberculosis Cardiovascular Patient has history of Congestive Heart Failure, Hypertension, Peripheral Venous Disease Endocrine Patient has history of Type II Diabetes Musculoskeletal Patient has history of Osteoarthritis Neurologic Patient has history of Neuropathy Oncologic Denies history of Received Chemotherapy, Received Radiation Psychiatric Denies history of Anorexia/bulimia, Confinement Anxiety Medical A Surgical History Notes nd Cardiovascular cardiomyopathy hyperlipidemia hypokalemia stroke Genitourinary orchitis Objective Constitutional no acute distress. Vitals Time Taken: 2:00 PM, Height: 74 in, Weight: 252 lbs, BMI: 32.4, Temperature: 98.3 F, Pulse: 90 bpm, Respiratory Rate: 22 breaths/min, Blood Pressure: 134/70 mmHg, Capillary Blood Glucose:  202 mg/dl. General Notes: glucose per pt report yesterday Respiratory Normal work of breathing on room air. General Notes: 11/23/2022: The ulcer on his foot remains closed. The lateral leg ulcers are still unchanged in size, but are a bit cleaner, just with some  slough accumulation. Integumentary (Hair, Skin) Wound #1 status is Open. Original cause of wound was Shear/Friction. The date acquired was: 09/16/2022. The wound has been in treatment 2 weeks. The wound is located on the Right,Lateral Lower Leg. The wound measures 5.7cm length x 1.2cm width x 0.5cm depth; 5.372cm^2 area and 2.686cm^3 volume. There is Fat Layer (Subcutaneous Tissue) exposed. There is no tunneling or undermining noted. There is a medium amount of serosanguineous drainage noted. The wound margin is distinct with the outline attached to the wound base. There is medium (34-66%) pink, pale granulation within the wound bed. There is a medium (34-66%) amount of necrotic tissue within the wound bed including Adherent Slough. The periwound skin appearance had no abnormalities noted for moisture. The periwound skin appearance exhibited: Scarring, Hemosiderin Staining. The periwound skin appearance did not exhibit: Rubor. Periwound temperature was noted as No Abnormality. Wound #1 status is Open. Original cause of wound was Shear/Friction. The date acquired was: 09/16/2022. The wound has been in treatment 2 weeks. The wound is located on the Right,Lateral Lower Leg. The wound measures 5.7cm length x 1.2cm width x 0.5cm depth; 5.372cm^2 area and 2.686cm^3 volume. There is a medium amount of serous drainage noted. Wound #2 status is Open. Original cause of wound was Shear/Friction. The date acquired was: 09/16/2022. The wound has been in treatment 2 weeks. The wound is located on the Right,Lateral Ankle. The wound measures 2cm length x 0.6cm width x 0.2cm depth; 0.942cm^2 area and 0.188cm^3 volume. There is Fat Layer (Subcutaneous Tissue) exposed. There is no tunneling or undermining noted. There is a medium amount of serosanguineous drainage noted. The wound margin is distinct with the outline attached to the wound base. There is medium (34-66%) pink, pale granulation within the wound bed. There is  a medium (34-66%) amount of necrotic tissue within the wound bed including Adherent Slough. The periwound skin appearance exhibited: Scarring, Hemosiderin Staining. The periwound skin appearance did not exhibit: Dry/Scaly, Maceration. Periwound temperature was noted as No Abnormality. Wound #2 status is Open. Original cause of wound was Shear/Friction. The date acquired was: 09/16/2022. The wound has been in treatment 2 weeks. The wound is located on the Right,Lateral Ankle. The wound measures 2cm length x 0.6cm width x 0.2cm depth; 0.942cm^2 area and 0.188cm^3 volume. There is a medium amount of serous drainage noted. Assessment Active Problems ICD-10 Non-pressure chronic ulcer of other part of right lower leg with unspecified severity Non-pressure chronic ulcer of right ankle with fat layer exposed Type 2 diabetes mellitus with other skin ulcer Chronic systolic (congestive) heart failure Peripheral vascular disease, unspecified Type 2 diabetes mellitus with diabetic polyneuropathy REMO, KIRSCHENMANN ([id number]) [id number] Page 8 of 11 Cerebral infarction, unspecified Essential (primary) hypertension Chronic pain syndrome Procedures Wound #1 Pre-procedure diagnosis of Wound #1 is a Diabetic Wound/Ulcer of the Lower Extremity located on the Right,Lateral Lower Leg .Severity of Tissue Pre Debridement is: Fat layer exposed. There was a Excisional Skin/Subcutaneous Tissue Debridement with a total area of 6.84 sq cm performed by [name], MD. With the following instrument(s): Curette to remove Viable and Non-Viable tissue/material. Material removed includes Subcutaneous Tissue and Slough and after achieving pain control using Lidocaine 4% T opical Solution. No specimens were taken. A  time out was conducted at 14:25, prior to the start of the procedure. A Minimum amount of bleeding was controlled with Pressure. The procedure was tolerated well with a pain level of  0 throughout and a pain level of 0 following the procedure. Post Debridement Measurements: 5.7cm length x 1.2cm width x 0.5cm depth; 2.686cm^3 volume. Character of Wound/Ulcer Post Debridement is improved. Severity of Tissue Post Debridement is: Fat layer exposed. Post procedure Diagnosis Wound #1: Same as Pre-Procedure General Notes: Scribed for Dr. Celine Ahr by Baruch Gouty, RN. Pre-procedure diagnosis of Wound #1 is a Diabetic Wound/Ulcer of the Lower Extremity located on the Right,Lateral Lower Leg . There was a Three Layer Compression Therapy Procedure by Baruch Gouty, RN. Post procedure Diagnosis Wound #1: Same as Pre-Procedure Wound #2 Pre-procedure diagnosis of Wound #2 is a Diabetic Wound/Ulcer of the Lower Extremity located on the Right,Lateral Ankle .Severity of Tissue Pre Debridement is: Fat layer exposed. There was a Excisional Skin/Subcutaneous Tissue Debridement with a total area of 1.2 sq cm performed by [name], MD. With the following instrument(s): Curette to remove Viable and Non-Viable tissue/material. Material removed includes Subcutaneous Tissue and Slough and after achieving pain control using Lidocaine 4% T opical Solution. No specimens were taken. A time out was conducted at 14:25, prior to the start of the procedure. A Minimum amount of bleeding was controlled with Pressure. The procedure was tolerated well with a pain level of 0 throughout and a pain level of 0 following the procedure. Post Debridement Measurements: 2cm length x 0.6cm width x 0.2cm depth; 0.188cm^3 volume. Character of Wound/Ulcer Post Debridement is improved. Severity of Tissue Post Debridement is: Fat layer exposed. Post procedure Diagnosis Wound #2: Same as Pre-Procedure General Notes: Scribed for Dr. Celine Ahr by Baruch Gouty, RN. Plan Follow-up Appointments: Return Appointment in 1 week. - Dr. Celine Ahr Rm 2 Thursday 2/15 @ 2:00 pm Anesthetic: Wound #1 Right,Lateral Lower Leg: (In clinic)  Topical Lidocaine 4% applied to wound bed Wound #2 Right,Lateral Ankle: (In clinic) Topical Lidocaine 4% applied to wound bed Bathing/ Shower/ Hygiene: May shower with protection but do not get wound dressing(s) wet. Protect dressing(s) with water repellant cover (for example, large plastic bag) or a cast cover and may then take shower. - May purchase cast protector at [facility], [facility], or [facility] Edema Control - Lymphedema / SCD / Other: Elevate legs to the level of the heart or above for 30 minutes daily and/or when sitting for 3-4 times a day throughout the day. Avoid standing for long periods of time. The following medication(s) was prescribed: lidocaine topical 4 % cream cream topical for prior to debridement was prescribed at facility WOUND #1: - Lower Leg Wound Laterality: Right, Lateral Cleanser: Soap and Water 1 x Per Week/ Discharge Instructions: May shower and wash wound with dial antibacterial soap and water prior to dressing change. Cleanser: Wound Cleanser 1 x Per Week/ Discharge Instructions: Cleanse the wound with wound cleanser prior to applying a clean dressing using gauze sponges, not tissue or cotton balls. Peri-Wound Care: Sween Lotion (Moisturizing lotion) 1 x Per Week/ Discharge Instructions: Apply moisturizing lotion as directed Prim Dressing: Sorbalgon AG Dressing 2x2 (in/in) 1 x Per Week/ ary Discharge Instructions: Apply to wound bed as instructed Secondary Dressing: Zetuvit Plus 4x8 in 1 x Per Week/ Discharge Instructions: Apply over primary dressing as directed. Secured With: Transpore Surgical T ape, 2x10 (in/yd) 1 x Per Week/ Discharge Instructions: Secure dressing with tape as directed. Com pression Wrap: ThreePress (3 layer compression  wrap) 1 x Per Week/ Discharge Instructions: Apply three layer compression as directed. WOUND #2: - Ankle Wound Laterality: Right, Lateral Cleanser: Soap and Water 1 x Per Week/ Discharge Instructions: May shower and wash wound  with dial antibacterial soap and water prior to dressing change. Cleanser: Wound Cleanser 1 x Per Week/ Discharge Instructions: Cleanse the wound with wound cleanser prior to applying a clean dressing using gauze sponges, not tissue or cotton balls. Peri-Wound Care: Sween Lotion (Moisturizing lotion) 1 x Per Week/ Discharge Instructions: Apply moisturizing lotion as directed Prim Dressing: Sorbalgon AG Dressing 2x2 (in/in) 1 x Per Week/ ary Discharge Instructions: Apply to wound bed as instructed Secondary Dressing: Zetuvit Plus 4x8 in 1 x Per Week/ Discharge Instructions: Apply over primary dressing as directed. Secured With: Transpore Surgical T ape, 2x10 (in/yd) 1 x Per Week/ Discharge Instructions: Secure dressing with tape as directed. Com pression Wrap: ThreePress (3 layer compression wrap) 1 x Per Week/ Discharge Instructions: Apply three layer compression as directed. DEMONTAE, ANTUNES ([id number]) [id number] Page 9 of 11 11/23/2022: The ulcer on his foot remains closed. The lateral leg ulcers are still unchanged in size, but are a bit cleaner, just with some slough accumulation. I used a curette to debride slough and nonviable subcutaneous tissue from both wound surfaces. We will continue silver alginate with 3 layer compression. Eventually, he may be a good candidate for skin substitute. Follow-up in 1 week. Electronic Signature(s) Signed: 11/23/2022 2:54:21 PM By: [name] MD FACS Entered By: [name] on 11/23/2022 14:54:21 -------------------------------------------------------------------------------- HxROS Details Patient Name: Date of Service: [name], [name] 11/23/2022 1:30 PM Medical Record Number: [id number] Patient Account Number: [id number] Date of Birth/Sex: Treating RN: [birth year] (67 y.o. [name] Primary Care Provider: Kathyrn Drown Other Clinician: Referring Provider: Treating Provider/Extender: Alean Rinne in Treatment: 2 Eyes Medical History: Negative for: Cataracts; Glaucoma; Optic Neuritis Ear/Nose/Mouth/Throat Medical History: Negative for: Chronic sinus problems/congestion; Middle ear problems Respiratory Medical History: Negative for: Aspiration; Asthma; Chronic Obstructive Pulmonary Disease (COPD); Pneumothorax; Sleep Apnea; Tuberculosis Cardiovascular Medical History: Positive for: Congestive Heart Failure; Hypertension; Peripheral Venous Disease Past Medical History Notes: cardiomyopathy hyperlipidemia hypokalemia stroke Endocrine Medical History: Positive for: Type II Diabetes Time with diabetes: 20 years Treated with: Insulin, Oral agents Genitourinary Medical History: Past Medical History Notes: orchitis Musculoskeletal Medical History: Positive for: Osteoarthritis Neurologic Medical History: Positive for: Neuropathy TYRELL, SEIFER ([id number]) [id number] Page 10 of 11 Oncologic Medical History: Negative for: Received Chemotherapy; Received Radiation Psychiatric Medical History: Negative for: Anorexia/bulimia; Confinement Anxiety Immunizations Pneumococcal Vaccine: Received Pneumococcal Vaccination: Yes Received Pneumococcal Vaccination On or After 60th Birthday: Yes Implantable Devices No devices added Family and Social History Cancer: Yes - Father,Mother,Siblings; Diabetes: Yes - Mother,Siblings; Heart Disease: Yes; Hereditary Spherocytosis: No; Hypertension: No; Kidney Disease: No; Lung Disease: Yes - Father; Seizures: No; Stroke: No; Thyroid Problems: No; Tuberculosis: No; Former smoker - ended on 12/23/1970; Marital Status - Married; Alcohol Use: Never; Drug Use: No History; Caffeine Use: Daily; Financial Concerns: No; Food, Clothing or Shelter Needs: No; Support System Lacking: No; Transportation Concerns: No Engineer, maintenance) Signed: 11/23/2022 5:26:12 PM By: [name] MD  FACS Signed: 11/23/2022 5:56:04 PM By: Baruch Gouty RN, BSN Entered By: [name] on 11/23/2022 14:53:12 -------------------------------------------------------------------------------- SuperBill Details Patient Name: Date of Service: [name], [name] 11/23/2022 Medical Record Number: [id number] Patient Account Number: [id number] Date of Birth/Sex: Treating RN: 1956/04/07 (67 y.o. [name] Primary Care Provider: Sallee Lange  A Other Clinician: Referring Provider: Treating Provider/Extender: Kristine Royal, Scott A Weeks in Treatment: 2 Diagnosis Coding ICD-10 Codes Code Description L97.819 Non-pressure chronic ulcer of other part of right lower leg with unspecified severity L97.312 Non-pressure chronic ulcer of right ankle with fat layer exposed E11.622 Type 2 diabetes mellitus with other skin ulcer K48.18 Chronic systolic (congestive) heart failure I73.9 Peripheral vascular disease, unspecified E11.42 Type 2 diabetes mellitus with diabetic polyneuropathy I63.9 Cerebral infarction, unspecified I10 Essential (primary) hypertension G89.4 Chronic pain syndrome Facility Procedures : CPT4 Code: [id number] Description: 26378 - DEB SUBQ TISSUE 20 SQ CM/< ICD-10 Diagnosis Description L97.819 Non-pressure chronic ulcer of other part of right lower leg with unspecified se L97.312 Non-pressure chronic ulcer of right ankle with fat layer exposed Modifier: verity Quantity: 1 Physician Procedures EDINSON, DOMEIER ([id number]): CPT4 Code Description [id number] 67209 - WC PHYS LEVEL 3 - EST PT ICD-10 Diagnosis Description L97.819 Non-pressure chronic ulcer of other part of right lower leg with L97.312 Non-pressure chronic ulcer of right ankle with fat  layer exposed E11.622 Type 2 diabetes mellitus with other skin ulcer O70.96 Chronic systolic (congestive) heart failure [id number] Page 11 of 11: Quantity Modifier 25 1 unspecified severity NICOLAI, LABONTE ([id number]): [id number] 11042 - WC PHYS SUBQ TISS 20 SQ CM ICD-10 Diagnosis Description L97.819 Non-pressure chronic ulcer of other part of right lower leg with L97.312 Non-pressure chronic ulcer of right ankle with fat layer exposed [id number] Page 11 of 11: 1 unspecified severity Electronic Signature(s) Signed: 11/23/2022 2:54:43 PM By: [name] MD FACS Entered By: [name] on 11/23/2022 14:54:43

## 2022-11-24 NOTE — Progress Notes (Signed)
[name], [name] ([id number]) [id number] Page 1 of 12 Visit Report for 11/23/2022 Arrival Information Details Patient Name: Date of Service: [name], [name] 11/23/2022 1:30 PM Medical Record Number: [id number] Patient Account Number: [id number] Date of Birth/Sex: Treating RN: 28-Dec-1955 (67 y.o. M) Primary Care [name]: [name] Other Clinician: Referring [name]: Treating [name]/Extender: [name] in Treatment: 2 Visit Information History Since Last Visit Added or deleted any medications: No Patient Arrived: Ambulatory Any new allergies or adverse reactions: No Arrival Time: 13:59 Had a fall or experienced change in No Accompanied By: wife activities of daily living that may affect Transfer Assistance: None risk of falls: Patient Identification Verified: Yes Signs or symptoms of abuse/neglect since last visito No Secondary Verification Process Completed: Yes Hospitalized since last visit: No Patient Requires Transmission-Based Precautions: No Implantable device outside of the clinic excluding No Patient Has Alerts: Yes cellular tissue based products placed in the center Patient Alerts: ABI RLE [name] since last visit: Has Dressing in Place as Prescribed: Yes Has Compression in Place as Prescribed: Yes Pain Present Now: No Electronic Signature(s) Signed: 11/23/2022 5:56:04 PM By: [name] Gouty RN, BSN Entered By: [name] on 11/23/2022 14:05:47 -------------------------------------------------------------------------------- Compression Therapy Details Patient Name: Date of Service: [name], [name] 11/23/2022 1:30 PM Medical Record Number: [id number] Patient Account Number: [id number] Date of Birth/Sex: Treating RN: 02-Nov-1955 (67 y.o. [name] Primary Care [name]: [name] Other Clinician: Referring Krysteena Stalker: Treating [name]/Extender: [name] in  Treatment: 2 Compression Therapy Performed for Wound Assessment: Wound #1 Right,Lateral Lower Leg Performed By: Clinician [name] Gouty, RN Compression Type: Three Layer Post Procedure Diagnosis Same as Pre-procedure Electronic Signature(s) Signed: 11/23/2022 5:56:04 PM By: [name] Gouty RN, BSN Entered By: [name] on 11/23/2022 14:25:53 [name] ([id number]) [id number] Page 2 of 12 -------------------------------------------------------------------------------- Encounter Discharge Information Details Patient Name: Date of Service: [name], [name] 11/23/2022 1:30 PM Medical Record Number: [id number] Patient Account Number: [id number] Date of Birth/Sex: Treating RN: 09-Sep-1956 (67 y.o. [name] Primary Care [name]: [name] Other Clinician: Referring Nawaal Alling: Treating [name]/Extender: [name] in Treatment: 2 Encounter Discharge Information Items Post Procedure Vitals Discharge Condition: Stable Temperature (F): 98.3 Ambulatory Status: Ambulatory Pulse (bpm): 90 Discharge Destination: Home Respiratory Rate (breaths/min): 18 Transportation: Private Auto Blood Pressure (mmHg): 134/70 Accompanied By: spouse Schedule Follow-up Appointment: No Clinical Summary of Care: Electronic Signature(s) Signed: 11/23/2022 5:56:04 PM By: [name] Gouty RN, BSN Entered By: [name] on 11/23/2022 14:48:54 -------------------------------------------------------------------------------- Lower Extremity Assessment Details Patient Name: Date of Service: [name], [name] 11/23/2022 1:30 PM Medical Record Number: [id number] Patient Account Number: [id number] Date of Birth/Sex: Treating RN: 05-Nov-1955 (67 y.o. [name] Primary Care [name]: [name] Other Clinician: Referring [name]: Treating [name]/Extender: [name] in Treatment: 2 Edema  Assessment Assessed: [Left: No] [Right: No] [Left: Edema] [Right: :] Calf Left: Right: Point of Measurement: From Medial Instep 40 cm Ankle Left: Right: Point of Measurement: From Medial Instep 27.5 cm Vascular Assessment Pulses: Dorsalis Pedis Palpable: [Right:Yes] Electronic Signature(s) Signed: 11/23/2022 5:56:04 PM By: [name] Gouty RN, BSN Entered By: [name] on 11/23/2022 14:07:12 Multi Wound Chart Details -------------------------------------------------------------------------------- [name] ([id number]) [id number] Page 3 of 12 Patient Name: Date of Service: [name], [name] 11/23/2022 1:30 PM Medical Record Number: [id number] Patient Account Number: [id number] Date of Birth/Sex: Treating RN: 12/27/1955 (67 y.o.  [name] Primary Care [name]: [name] Other Clinician: Referring [name]: Treating [name]/Extender: [name] in Treatment: 2 Vital Signs Height(in): 74 Capillary Blood Glucose(mg/dl): 202 Weight(lbs): 252 Pulse(bpm): 10 Body Mass Index(BMI): 32.4 Blood Pressure(mmHg): 134/70 Temperature(F): 98.3 Respiratory Rate(breaths/min): 22 Wound Assessments Wound Number: '1 1 2 '$ Photos: No Photos No Photos Right, Lateral Lower Leg Right, Lateral Lower Leg Right, Lateral Ankle Wound Location: Shear/Friction Shear/Friction Shear/Friction Wounding Event: Diabetic Wound/Ulcer of the Lower Diabetic Wound/Ulcer of the Lower Diabetic Wound/Ulcer of the Lower Primary Etiology: Extremity Extremity Extremity Congestive Heart Failure, Congestive Heart Failure, Congestive Heart Failure, Comorbid History: Hypertension, Peripheral Venous Hypertension, Peripheral Venous Hypertension, Peripheral Venous Disease, Type II Diabetes, Disease, Type II Diabetes, Disease, Type II Diabetes, Osteoarthritis, Neuropathy Osteoarthritis, Neuropathy Osteoarthritis, Neuropathy 09/16/2022 09/16/2022  09/16/2022 Date Acquired: '2 2 2 '$ [name] of Treatment: Open Open Open Wound Status: No No No Wound Recurrence: 5.7x1.2x0.5 5.7x1.2x0.5 2x0.6x0.2 Measurements L x W x D (cm) 5.372 5.372 0.942 A (cm) : rea 2.686 2.686 0.188 Volume (cm) : -26.70% -26.70% -33.20% % Reduction in A rea: -111.20% -111.20% -33.30% % Reduction in Volume: Grade 1 Grade 1 Grade 1 Classification: Medium Medium Medium Exudate A mount: Serosanguineous Serous Serosanguineous Exudate Type: red, brown amber red, brown Exudate Color: Distinct, outline attached N/A Distinct, outline attached Wound Margin: Medium (34-66%) N/A Medium (34-66%) Granulation A mount: Pink, Pale N/A Pink, Pale Granulation Quality: Medium (34-66%) N/A Medium (34-66%) Necrotic A mount: Fat Layer (Subcutaneous Tissue): Yes N/A Fat Layer (Subcutaneous Tissue): Yes Exposed Structures: Fascia: No Fascia: No Tendon: No Tendon: No Muscle: No Muscle: No Joint: No Joint: No Bone: No Bone: No None N/A Small (1-33%) Epithelialization: Debridement - Excisional Debridement - Excisional Debridement - Excisional Debridement: Pre-procedure Verification/Time Out 14:25 14:25 14:25 Taken: Lidocaine 4% Topical Solution Lidocaine 4% Topical Solution Lidocaine 4% Topical Solution Pain Control: Subcutaneous, Slough Subcutaneous, Slough Subcutaneous, Slough Tissue Debrided: Skin/Subcutaneous Tissue Skin/Subcutaneous Tissue Skin/Subcutaneous Tissue Level: 6.84 6.84 1.2 Debridement A (sq cm): rea Curette Curette Curette Instrument: Minimum Minimum Minimum Bleeding: Pressure Pressure Pressure Hemostasis A chieved: 0 0 0 Procedural Pain: 0 0 0 Post Procedural Pain: Procedure was tolerated well Procedure was tolerated well Procedure was tolerated well Debridement Treatment Response: 5.7x1.2x0.5 5.7x1.2x0.5 2x0.6x0.2 Post Debridement Measurements L x W x D (cm) 2.686 2.686 0.188 Post Debridement Volume: (cm) Scarring:  Yes Scarring: Yes Periwound Skin Texture: Dry/Scaly: No Maceration: No Periwound Skin Moisture: Dry/Scaly: No Hemosiderin Staining: Yes Hemosiderin Staining: Yes Periwound Skin Color: Rubor: No No Abnormality N/A No Abnormality Temperature: Compression Therapy Compression Therapy Debridement Procedures Performed: Debridement Debridement [name], [name] ([id number]) [id number] Page 4 of 12 Wound Number: 2 N/A N/A Photos: N/A N/A Right, Lateral Ankle N/A N/A Wound Location: Shear/Friction N/A N/A Wounding Event: Diabetic Wound/Ulcer of the Lower N/A N/A Primary Etiology: Extremity Congestive Heart Failure, N/A N/A Comorbid History: Hypertension, Peripheral Venous Disease, Type II Diabetes, Osteoarthritis, Neuropathy 09/16/2022 N/A N/A Date Acquired: 2 N/A N/A [name] of Treatment: Open N/A N/A Wound Status: No N/A N/A Wound Recurrence: 2x0.6x0.2 N/A N/A Measurements L x W x D (cm) 0.942 N/A N/A A (cm) : rea 0.188 N/A N/A Volume (cm) : -33.20% N/A N/A % Reduction in A rea: -33.30% N/A N/A % Reduction in Volume: Grade 1 N/A N/A Classification: Medium N/A N/A Exudate A mount: Serous N/A N/A Exudate Type: amber N/A N/A Exudate Color: N/A N/A N/A Wound Margin: N/A N/A N/A Granulation A mount: N/A N/A N/A Granulation Quality: N/A N/A  N/A Necrotic A mount: N/A N/A N/A Exposed Structures: N/A N/A N/A Epithelialization: Debridement - Excisional N/A N/A Debridement: Pre-procedure Verification/Time Out 14:25 N/A N/A Taken: Lidocaine 4% Topical Solution N/A N/A Pain Control: Subcutaneous, Slough N/A N/A Tissue Debrided: Skin/Subcutaneous Tissue N/A N/A Level: 1.2 N/A N/A Debridement A (sq cm): rea Curette N/A N/A Instrument: Minimum N/A N/A Bleeding: Pressure N/A N/A Hemostasis A chieved: 0 N/A N/A Procedural Pain: 0 N/A N/A Post Procedural Pain: Procedure was tolerated well N/A N/A Debridement Treatment  Response: 2x0.6x0.2 N/A N/A Post Debridement Measurements L x W x D (cm) 0.188 N/A N/A Post Debridement Volume: (cm) N/A N/A N/A Temperature: Debridement N/A N/A Procedures Performed: Treatment Notes Wound #1 (Lower Leg) Wound Laterality: Right, Lateral Cleanser Soap and Water Discharge Instruction: May shower and wash wound with dial antibacterial soap and water prior to dressing change. Wound Cleanser Discharge Instruction: Cleanse the wound with wound cleanser prior to applying a clean dressing using gauze sponges, not tissue or cotton balls. Peri-Wound Care Sween Lotion (Moisturizing lotion) Discharge Instruction: Apply moisturizing lotion as directed Topical Primary Dressing Sorbalgon AG Dressing 2x2 (in/in) Discharge Instruction: Apply to wound bed as instructed Secondary Dressing Zetuvit Plus 4x8 in Discharge Instruction: Apply over primary dressing as directed. [name], [name] ([id number]) [id number] Page 5 of 12 Secured With [employer] Surgical Tape, 2x10 (in/yd) Discharge Instruction: Secure dressing with tape as directed. Compression Wrap ThreePress (3 layer compression wrap) Discharge Instruction: Apply three layer compression as directed. Compression Stockings Add-Ons Wound #2 (Ankle) Wound Laterality: Right, Lateral Cleanser Soap and Water Discharge Instruction: May shower and wash wound with dial antibacterial soap and water prior to dressing change. Wound Cleanser Discharge Instruction: Cleanse the wound with wound cleanser prior to applying a clean dressing using gauze sponges, not tissue or cotton balls. Peri-Wound Care Sween Lotion (Moisturizing lotion) Discharge Instruction: Apply moisturizing lotion as directed Topical Primary Dressing Sorbalgon AG Dressing 2x2 (in/in) Discharge Instruction: Apply to wound bed as instructed Secondary Dressing Zetuvit Plus 4x8 in Discharge Instruction: Apply over primary dressing as  directed. Secured With Transpore Surgical Tape, 2x10 (in/yd) Discharge Instruction: Secure dressing with tape as directed. Compression Wrap ThreePress (3 layer compression wrap) Discharge Instruction: Apply three layer compression as directed. Compression Stockings Add-Ons Electronic Signature(s) Signed: 11/23/2022 2:52:25 PM By: [name] MD FACS Signed: 11/23/2022 5:56:04 PM By: [name] Gouty RN, BSN Entered By: [name] on 11/23/2022 14:52:25 -------------------------------------------------------------------------------- Multi-Disciplinary Care Plan Details Patient Name: Date of Service: [place], [name] 11/23/2022 1:30 PM Medical Record Number: [id number] Patient Account Number: [id number] Date of Birth/Sex: Treating RN: 1955/10/19 (67 y.o. [name] Primary Care Kalik Hoare: [name] Other Clinician: Referring Hetty Linhart: Treating Azell Bill/Extender: [name] in Treatment: 2 Multidisciplinary Care Plan reviewed with physician [address] RALPH, BROUWER ([id number]) [id number] Page 6 of 12 Venous Leg Ulcer Nursing Diagnoses: Knowledge deficit related to disease process and management Potential for venous Insuffiency (use before diagnosis confirmed) Goals: Patient will maintain optimal edema control Date Initiated: 11/23/2022 Target Resolution Date: 12/15/2022 Goal Status: Active Interventions: Assess peripheral edema status every visit. Compression as ordered Treatment Activities: Therapeutic compression applied : 11/23/2022 Notes: Wound/Skin Impairment Nursing Diagnoses: Impaired tissue integrity Goals: Patient/caregiver will verbalize understanding of skin care regimen Date Initiated: 11/23/2022 Target Resolution Date: 12/15/2022 Goal Status: Active Ulcer/skin breakdown will have a volume reduction of 30% by week 4 Date Initiated: 11/07/2022 Target Resolution Date: 12/15/2022 Goal  Status: Active Interventions: Assess patient/caregiver ability to obtain necessary supplies  Assess patient/caregiver ability to perform ulcer/skin care regimen upon admission and as needed Assess ulceration(s) every visit Treatment Activities: Skin care regimen initiated : 11/07/2022 Topical wound management initiated : 11/07/2022 Notes: Electronic Signature(s) Signed: 11/23/2022 5:56:04 PM By: [name] Gouty RN, BSN Entered By: [name] on 11/23/2022 14:10:49 -------------------------------------------------------------------------------- Pain Assessment Details Patient Name: Date of Service: [name], [name] 11/23/2022 1:30 PM Medical Record Number: [id number] Patient Account Number: [id number] Date of Birth/Sex: Treating RN: 12/10/1955 (67 y.o. M) Primary Care Qunisha Bryk: [name] Other Clinician: Referring Edessa Jakubowicz: Treating Gazelle Towe/Extender: [name] in Treatment: 2 Active Problems Location of Pain Severity and Description of Pain Patient Has Paino No Site Locations [name], [name] ([id number]) [phone].pdf Page 7 of 12 Pain Management and Medication Current Pain Management: Electronic Signature(s) Signed: 11/23/2022 3:40:43 PM By: Sandre Kitty Entered By: Sandre Kitty on 11/23/2022 14:01:01 -------------------------------------------------------------------------------- Patient/Caregiver Education Details Patient Name: Date of Service: [name], [name] 2/7/2024andnbsp1:30 PM Medical Record Number: [id number] Patient Account Number: [id number] Date of Birth/Gender: Treating RN: [birth year] (67 y.o. [name] Primary Care Physician: [name] Other Clinician: Referring Physician: Treating Physician/Extender: [name] in Treatment: 2 Education Assessment Education Provided To: Patient Education Topics Provided Venous: Methods: Explain/Verbal Responses:  Reinforcements needed, State content correctly Electronic Signature(s) Signed: 11/23/2022 5:56:04 PM By: [name] Gouty RN, BSN Entered By: [name] on 11/23/2022 14:11:09 -------------------------------------------------------------------------------- Wound Assessment Details Patient Name: Date of Service: [name], [name] 11/23/2022 1:30 PM Medical Record Number: [id number] Patient Account Number: [id number] Date of Birth/Sex: Treating RN: April 29, 1956 (67 y.o. [name] Primary Care Laurabelle Gorczyca: [name] Other Clinician: Referring Bryella Diviney: Treating Lita Flynn/Extender: Coral Else ([id number]) [id number] Page 8 of 12 [name] in Treatment: 2 Wound Status Wound Number: 1 Primary Diabetic Wound/Ulcer of the Lower Extremity Etiology: Wound Location: Right, Lateral Lower Leg Wound Open Wounding Event: Shear/Friction Status: Date Acquired: 09/16/2022 Comorbid Congestive Heart Failure, Hypertension, Peripheral Venous [name] Of Treatment: 2 History: Disease, Type II Diabetes, Osteoarthritis, Neuropathy Clustered Wound: No Wound Measurements Length: (cm) 5.7 Width: (cm) 1.2 Depth: (cm) 0.5 Area: (cm) 5.372 Volume: (cm) 2.686 % Reduction in Area: -26.7% % Reduction in Volume: -111.2% Epithelialization: None Tunneling: No Undermining: No Wound Description Classification: Grade 1 Wound Margin: Distinct, outline attached Exudate Amount: Medium Exudate Type: Serosanguineous Exudate Color: red, brown Foul Odor After Cleansing: No Slough/Fibrino Yes Wound Bed Granulation Amount: Medium (34-66%) Exposed Structure Granulation Quality: Pink, Pale Fascia Exposed: No Necrotic Amount: Medium (34-66%) Fat Layer (Subcutaneous Tissue) Exposed: Yes Necrotic Quality: Adherent Slough Tendon Exposed: No Muscle Exposed: No Joint Exposed: No Bone Exposed: No Periwound Skin Texture Texture Color No  Abnormalities Noted: No No Abnormalities Noted: No Scarring: Yes Hemosiderin Staining: Yes Rubor: No Moisture No Abnormalities Noted: Yes Temperature / Pain Temperature: No Abnormality Electronic Signature(s) Signed: 11/23/2022 5:56:04 PM By: [name] Gouty RN, BSN Entered By: [name] on 11/23/2022 14:08:17 -------------------------------------------------------------------------------- Wound Assessment Details Patient Name: Date of Service: [name], [name] 11/23/2022 1:30 PM Medical Record Number: [id number] Patient Account Number: [id number] Date of Birth/Sex: Treating RN: 08-27-1956 (67 y.o. [name] Primary Care Pranit Owensby: [name] Other Clinician: Referring Whyatt Klinger: Treating Cayli Escajeda/Extender: [name] in Treatment: 2 Wound Status Wound Number: 1 Primary Diabetic Wound/Ulcer of the Lower Extremity Etiology: Wound Location: Right, Lateral Lower Leg Wound Open Wounding Event: Shear/Friction Status: Date Acquired: 09/16/2022 Comorbid Congestive Heart  Failure, Hypertension, Peripheral Venous [name] Of Treatment: 2 History: Disease, Type II Diabetes, Osteoarthritis, Neuropathy Clustered Wound: No Photos [name], [name] ([id number]) [id number] Page 9 of 12 Wound Measurements Length: (cm) 5.7 Width: (cm) 1.2 Depth: (cm) 0.5 Area: (cm) 5.372 Volume: (cm) 2.686 % Reduction in Area: -26.7% % Reduction in Volume: -111.2% Wound Description Classification: Exudate Amount: Exudate Type: Exudate Color: Grade 1 Medium Serous amber Periwound Skin Texture Texture Color No Abnormalities Noted: No No Abnormalities Noted: No Moisture No Abnormalities Noted: No Treatment Notes Wound #1 (Lower Leg) Wound Laterality: Right, Lateral Cleanser Soap and Water Discharge Instruction: May shower and wash wound with dial antibacterial soap and water prior to dressing change. Wound Cleanser Discharge  Instruction: Cleanse the wound with wound cleanser prior to applying a clean dressing using gauze sponges, not tissue or cotton balls. Peri-Wound Care Sween Lotion (Moisturizing lotion) Discharge Instruction: Apply moisturizing lotion as directed Topical Primary Dressing Sorbalgon AG Dressing 2x2 (in/in) Discharge Instruction: Apply to wound bed as instructed Secondary Dressing Zetuvit Plus 4x8 in Discharge Instruction: Apply over primary dressing as directed. Secured With Transpore Surgical Tape, 2x10 (in/yd) Discharge Instruction: Secure dressing with tape as directed. Compression Wrap ThreePress (3 layer compression wrap) Discharge Instruction: Apply three layer compression as directed. Compression Stockings Add-Ons Electronic Signature(s) Signed: 11/23/2022 4:30:54 PM By: Worthy Rancher Signed: 11/23/2022 5:56:04 PM By: [name] Gouty RN, BSN Entered By: Worthy Rancher on 11/23/2022 14:08:54 [name] ([id number]) [id number] Page 10 of 12 -------------------------------------------------------------------------------- Wound Assessment Details Patient Name: Date of Service: [name] 11/23/2022 1:30 PM Medical Record Number: [id number] Patient Account Number: [id number] Date of Birth/Sex: Treating RN: 1956/01/13 (67 y.o. M) Primary Care Javona Bergevin: [name] Other Clinician: Referring Jamarii Banks: Treating Delpha Perko/Extender: [name] in Treatment: 2 Wound Status Wound Number: 2 Primary Diabetic Wound/Ulcer of the Lower Extremity Etiology: Wound Location: Right, Lateral Ankle Wound Open Wounding Event: Shear/Friction Status: Date Acquired: 09/16/2022 Comorbid Congestive Heart Failure, Hypertension, Peripheral Venous [name] Of Treatment: 2 History: Disease, Type II Diabetes, Osteoarthritis, Neuropathy Clustered Wound: No Wound Measurements Length: (cm) 2 Width: (cm) 0.6 Depth: (cm) 0.2 Area: (cm)  0.942 Volume: (cm) 0.188 % Reduction in Area: -33.2% % Reduction in Volume: -33.3% Epithelialization: Small (1-33%) Tunneling: No Undermining: No Wound Description Classification: Grade 1 Wound Margin: Distinct, outline attached Exudate Amount: Medium Exudate Type: Serosanguineous Exudate Color: red, brown Foul Odor After Cleansing: No Slough/Fibrino Yes Wound Bed Granulation Amount: Medium (34-66%) Exposed Structure Granulation Quality: Pink, Pale Fascia Exposed: No Necrotic Amount: Medium (34-66%) Fat Layer (Subcutaneous Tissue) Exposed: Yes Necrotic Quality: Adherent Slough Tendon Exposed: No Muscle Exposed: No Joint Exposed: No Bone Exposed: No Periwound Skin Texture Texture Color No Abnormalities Noted: No No Abnormalities Noted: No Scarring: Yes Hemosiderin Staining: Yes Moisture Temperature / Pain No Abnormalities Noted: No Temperature: No Abnormality Dry / Scaly: No Maceration: No Electronic Signature(s) Signed: 11/23/2022 5:56:04 PM By: [name] Gouty RN, BSN Entered By: [name] on 11/23/2022 14:09:03 -------------------------------------------------------------------------------- Wound Assessment Details Patient Name: Date of Service: [name], [name] 11/23/2022 1:30 PM Medical Record Number: [id number] Patient Account Number: [id number] Date of Birth/Sex: Treating RN: 01-09-1956 (67 y.o. Teresa Coombs ([id number]) [id number] Page 11 of 12 Primary Care Zaraya Delauder: [name] Other Clinician: Referring Saskia Simerson: Treating Dequarius Jeffries/Extender: [name] in Treatment: 2 Wound Status Wound Number: 2 Primary Diabetic Wound/Ulcer of the Lower Extremity Etiology: Wound Location: Right, Lateral Ankle Wound Open Wounding Event: Shear/Friction Status:  Date Acquired: 09/16/2022 Comorbid Congestive Heart Failure, Hypertension, Peripheral Venous [name] Of Treatment: 2 History: Disease, Type II  Diabetes, Osteoarthritis, Neuropathy Clustered Wound: No Photos Wound Measurements Length: (cm) 2 Width: (cm) 0.6 Depth: (cm) 0.2 Area: (cm) 0.942 Volume: (cm) 0.188 % Reduction in Area: -33.2% % Reduction in Volume: -33.3% Wound Description Classification: Exudate Amount: Exudate Type: Exudate Color: Grade 1 Medium Serous amber Periwound Skin Texture Texture Color No Abnormalities Noted: No No Abnormalities Noted: No Moisture No Abnormalities Noted: No Treatment Notes Wound #2 (Ankle) Wound Laterality: Right, Lateral Cleanser Soap and Water Discharge Instruction: May shower and wash wound with dial antibacterial soap and water prior to dressing change. Wound Cleanser Discharge Instruction: Cleanse the wound with wound cleanser prior to applying a clean dressing using gauze sponges, not tissue or cotton balls. Peri-Wound Care Sween Lotion (Moisturizing lotion) Discharge Instruction: Apply moisturizing lotion as directed Topical Primary Dressing Sorbalgon AG Dressing 2x2 (in/in) Discharge Instruction: Apply to wound bed as instructed Secondary Dressing Zetuvit Plus 4x8 in Discharge Instruction: Apply over primary dressing as directed. Secured With Transpore Surgical Tape, 2x10 (in/yd) Discharge Instruction: Secure dressing with tape as directed. [name], [name] ([id number]) [id number] Page 12 of 12 Compression Wrap ThreePress (3 layer compression wrap) Discharge Instruction: Apply three layer compression as directed. Compression Stockings Add-Ons Electronic Signature(s) Signed: 11/23/2022 4:30:54 PM By: Worthy Rancher Entered By: Worthy Rancher on 11/23/2022 14:09:26 -------------------------------------------------------------------------------- Vitals Details Patient Name: Date of Service: [name], [name] 11/23/2022 1:30 PM Medical Record Number: [id number] Patient Account Number: [id number] Date of Birth/Sex: Treating RN: October 27, 1955  (67 y.o. M) Primary Care Hanley Woerner: [name] Other Clinician: Referring Jamirra Curnow: Treating Phoua Hoadley/Extender: [name] in Treatment: 2 Vital Signs Time Taken: 14:00 Temperature (F): 98.3 Height (in): 74 Pulse (bpm): 90 Weight (lbs): 252 Respiratory Rate (breaths/min): 22 Body Mass Index (BMI): 32.4 Blood Pressure (mmHg): 134/70 Capillary Blood Glucose (mg/dl): 202 Reference Range: 80 - 120 mg / dl Notes glucose per pt report yesterday Electronic Signature(s) Signed: 11/23/2022 5:56:04 PM By: [name] Gouty RN, BSN Entered By: [name] on 11/23/2022 14:06:32

## 2022-12-01 ENCOUNTER — Encounter (HOSPITAL_BASED_OUTPATIENT_CLINIC_OR_DEPARTMENT_OTHER): Payer: Medicare HMO | Admitting: General Surgery

## 2022-12-01 DIAGNOSIS — Z8673 Personal history of transient ischemic attack (TIA), and cerebral infarction without residual deficits: Secondary | ICD-10-CM | POA: Diagnosis not present

## 2022-12-01 DIAGNOSIS — L97812 Non-pressure chronic ulcer of other part of right lower leg with fat layer exposed: Secondary | ICD-10-CM | POA: Diagnosis not present

## 2022-12-01 DIAGNOSIS — G894 Chronic pain syndrome: Secondary | ICD-10-CM | POA: Diagnosis not present

## 2022-12-01 DIAGNOSIS — E1142 Type 2 diabetes mellitus with diabetic polyneuropathy: Secondary | ICD-10-CM | POA: Diagnosis not present

## 2022-12-01 DIAGNOSIS — I110 Hypertensive heart disease with heart failure: Secondary | ICD-10-CM | POA: Diagnosis not present

## 2022-12-01 DIAGNOSIS — E1151 Type 2 diabetes mellitus with diabetic peripheral angiopathy without gangrene: Secondary | ICD-10-CM | POA: Diagnosis not present

## 2022-12-01 DIAGNOSIS — I5022 Chronic systolic (congestive) heart failure: Secondary | ICD-10-CM | POA: Diagnosis not present

## 2022-12-01 DIAGNOSIS — L97512 Non-pressure chronic ulcer of other part of right foot with fat layer exposed: Secondary | ICD-10-CM | POA: Diagnosis not present

## 2022-12-01 DIAGNOSIS — E11622 Type 2 diabetes mellitus with other skin ulcer: Secondary | ICD-10-CM | POA: Diagnosis not present

## 2022-12-01 DIAGNOSIS — L97312 Non-pressure chronic ulcer of right ankle with fat layer exposed: Secondary | ICD-10-CM | POA: Diagnosis not present

## 2022-12-01 DIAGNOSIS — E11621 Type 2 diabetes mellitus with foot ulcer: Secondary | ICD-10-CM | POA: Diagnosis not present

## 2022-12-03 NOTE — Progress Notes (Signed)
[name], [name] ([id number]) [id number] Page 1 of 11 Visit Report for 12/01/2022 Chief Complaint Document Details Patient Name: Date of Service: [name] 12/01/2022 2:00 PM Medical Record Number: [id number] Patient Account Number: [id number] Date of Birth/Sex: Treating RN: July 09, 1956 (68 y.o. M) Primary Care Provider: Kathyrn Drown Other Clinician: Referring Provider: Treating Provider/Extender: Alean Rinne in Treatment: 3 Information Obtained from: Patient Chief Complaint Patients presents for treatment of an open diabetic ulcer on his right foot, as well as ulcers on his right lower leg and ankle Electronic Signature(s) Signed: 12/01/2022 3:02:28 PM By: [name] MD FACS Entered By: [name] on 12/01/2022 15:02:28 -------------------------------------------------------------------------------- Debridement Details Patient Name: Date of Service: [name] RE, [name] 12/01/2022 2:00 PM Medical Record Number: [id number] Patient Account Number: [id number] Date of Birth/Sex: Treating RN: 08-28-1956 (67 y.o. Waldron Session Primary Care Provider: Kathyrn Drown Other Clinician: Referring Provider: Treating Provider/Extender: Alean Rinne in Treatment: 3 Debridement Performed for Assessment: Wound #1 Right,Lateral Lower Leg Performed By: Physician [name], MD Debridement Type: Debridement Severity of Tissue Pre Debridement: Fat layer exposed Level of Consciousness (Pre-procedure): Awake and Alert Pre-procedure Verification/Time Out Yes - 14:21 Taken: Start Time: 14:22 Pain Control: Lidocaine 5% topical ointment T Area Debrided (L x W): otal 5.7 (cm) x 1.3 (cm) = 7.41 (cm) Tissue and other material debrided: Non-Viable, Slough, Slough Level: Non-Viable Tissue Debridement Description: Selective/Open Wound Instrument: Curette Bleeding: Minimum Hemostasis Achieved:  Pressure Response to Treatment: Procedure was tolerated well Level of Consciousness (Post- Awake and Alert procedure): Post Debridement Measurements of Total Wound Length: (cm) 5.7 Width: (cm) 1.3 Depth: (cm) 0.5 Volume: (cm) 2.91 Character of Wound/Ulcer Post Debridement: Requires Further Debridement Severity of Tissue Post Debridement: Fat layer exposed Post Procedure Diagnosis Same as Pre-procedure [name] ([id number]) [phone].pdf Page 2 of 11 Notes Scribed for Dr. Celine Ahr by Blanche East, RN Electronic Signature(s) Signed: 12/01/2022 3:44:25 PM By: [name] MD FACS Signed: 12/01/2022 5:14:21 PM By: Blanche East RN Entered By: Blanche East on 12/01/2022 14:24:19 -------------------------------------------------------------------------------- Debridement Details Patient Name: Date of Service: [name] RE, [name] 12/01/2022 2:00 PM Medical Record Number: [id number] Patient Account Number: [id number] Date of Birth/Sex: Treating RN: [birth year] (67 y.o. Waldron Session Primary Care Provider: Kathyrn Drown Other Clinician: Referring Provider: Treating Provider/Extender: Alean Rinne in Treatment: 3 Debridement Performed for Assessment: Wound #2 Right,Lateral Ankle Performed By: Physician [name], MD Debridement Type: Debridement Severity of Tissue Pre Debridement: Fat layer exposed Level of Consciousness (Pre-procedure): Awake and Alert Pre-procedure Verification/Time Out Yes - 14:21 Taken: Start Time: 14:22 Pain Control: Lidocaine 5% topical ointment T Area Debrided (L x W): otal 1.8 (cm) x 0.5 (cm) = 0.9 (cm) Tissue and other material debrided: Viable, Non-Viable, Slough, Subcutaneous, Slough Level: Skin/Subcutaneous Tissue Debridement Description: Excisional Instrument: Curette Bleeding: Minimum Hemostasis Achieved: Pressure Response to Treatment: Procedure was tolerated well Level of  Consciousness (Post- Awake and Alert procedure): Post Debridement Measurements of Total Wound Length: (cm) 1.8 Width: (cm) 0.5 Depth: (cm) 0.2 Volume: (cm) 0.141 Character of Wound/Ulcer Post Debridement: Requires Further Debridement Severity of Tissue Post Debridement: Fat layer exposed Post Procedure Diagnosis Same as Pre-procedure Notes Scribed for Dr. Celine Ahr by Blanche East, RN Electronic Signature(s) Signed: 12/01/2022 3:44:25 PM By: [name] MD FACS Signed: 12/01/2022 5:14:21 PM By: Blanche East RN Entered By: Blanche East on 12/01/2022 14:25:19 HPI Details -------------------------------------------------------------------------------- [name] ([id number].pdf Page  3 of 11 Patient Name: Date of Service: [name], [name] 12/01/2022 2:00 PM Medical Record Number: [id number] Patient Account Number: [id number] Date of Birth/Sex: Treating RN: Feb 12, 1956 (67 y.o. M) Primary Care Provider: Kathyrn Drown Other Clinician: Referring Provider: Treating Provider/Extender: Alean Rinne in Treatment: 3 History of Present Illness HPI Description: ADMISSION 11/07/2022 This is a 67 year old man with a past medical history significant for type 2 diabetes (last hemoglobin A1c 7.1), cerebrovascular occlusive disease, coronary artery disease, peripheral vascular disease, congestive heart failure, chronic pain syndrome, and prior amputation of his right great and second toes. He has a remote history of surgery on his right ankle and apparently as his legs have become increasingly more swollen and he has insisted upon wearing boots, the pressure and friction seem to have opened up wounds along the old scar line. In addition, he has a large callus on the third metatarsal head, related to his acquired foot deformity. He sees a podiatrist who periodically shaves this callus down and he was also recommended orthotics, but  the patient has refused them because he likes his boots. There is an area of discoloration in the lower portion of this callus, suspicious for an open wound. 11/14/2022: The ulcer on his third metatarsal head has closed. The lateral leg ulcers are both about the same size, but he says that they feel better. There is slough accumulation in both sites. 11/23/2022: The ulcer on his foot remains closed. The lateral leg ulcers are still unchanged in size, but are a bit cleaner, just with some slough accumulation. 12/01/2022: No significant change to the size of the wounds on his lateral leg. The larger is quite clean with good granulation tissue present. The smaller still has fairly thick slough buildup. Electronic Signature(s) Signed: 12/01/2022 3:03:04 PM By: [name] MD FACS Entered By: [name] on 12/01/2022 15:03:03 -------------------------------------------------------------------------------- Physical Exam Details Patient Name: Date of Service: [name] RE, [name] 12/01/2022 2:00 PM Medical Record Number: [id number] Patient Account Number: [id number] Date of Birth/Sex: Treating RN: Mar 06, 1956 (67 y.o. M) Primary Care Provider: Kathyrn Drown Other Clinician: Referring Provider: Treating Provider/Extender: Kristine Royal, Scott A Weeks in Treatment: 3 Constitutional Slightly hypertensive. . . . no acute distress. Respiratory Normal work of breathing on room air. Notes 12/01/2022: No significant change to the size of the wounds on his lateral leg. The larger is quite clean with good granulation tissue present. The smaller still has fairly thick slough buildup. Electronic Signature(s) Signed: 12/01/2022 3:03:34 PM By: [name] MD FACS Entered By: [name] on 12/01/2022 15:03:34 -------------------------------------------------------------------------------- Physician Orders Details Patient Name: Date of Service: [name] RE, [name] 12/01/2022 2:00  PM Medical Record Number: [id number] Patient Account Number: [id number] Date of Birth/Sex: Treating RN: 15-Apr-1956 (67 y.o. Waldron Session Primary Care Provider: Kathyrn Drown Other Clinician: Edwyna [name] ([id number]) [id number] Page 4 of 11 Referring Provider: Treating Provider/Extender: Alean Rinne in Treatment: 3 Verbal / Phone Orders: No Diagnosis Coding ICD-10 Coding Code Description L97.819 Non-pressure chronic ulcer of other part of right lower leg with unspecified severity L97.312 Non-pressure chronic ulcer of right ankle with fat layer exposed E11.622 Type 2 diabetes mellitus with other skin ulcer [id number] Chronic systolic (congestive) heart failure I73.9 Peripheral vascular disease, unspecified E11.42 Type 2 diabetes mellitus with diabetic polyneuropathy I63.9 Cerebral infarction, unspecified I10 Essential (primary) hypertension G89.4 Chronic pain syndrome Follow-up Appointments ppointment in 1 week. - Dr. Celine Ahr Rm  2 Return A Anesthetic Wound #1 Right,Lateral Lower Leg (In clinic) Topical Lidocaine 4% applied to wound bed Wound #2 Right,Lateral Ankle (In clinic) Topical Lidocaine 4% applied to wound bed Bathing/ Shower/ Hygiene May shower with protection but do not get wound dressing(s) wet. Protect dressing(s) with water repellant cover (for example, large plastic bag) or a cast cover and may then take shower. - May purchase cast protector at [facility], [facility], or [facility] Edema Control - Lymphedema / SCD / Other Right Lower Extremity Elevate legs to the level of the heart or above for 30 minutes daily and/or when sitting for 3-4 times a day throughout the day. Avoid standing for long periods of time. Wound Treatment Wound #1 - Lower Leg Wound Laterality: Right, Lateral Cleanser: Soap and Water 1 x Per Week Discharge Instructions: May shower and wash wound with dial antibacterial soap and water prior to  dressing change. Cleanser: Wound Cleanser 1 x Per Week Discharge Instructions: Cleanse the wound with wound cleanser prior to applying a clean dressing using gauze sponges, not tissue or cotton balls. Peri-Wound Care: Sween Lotion (Moisturizing lotion) 1 x Per Week Discharge Instructions: Apply moisturizing lotion as directed Prim Dressing: Sorbalgon AG Dressing 2x2 (in/in) 1 x Per Week ary Discharge Instructions: Apply to wound bed as instructed Secondary Dressing: Zetuvit Plus 4x8 in 1 x Per Week Discharge Instructions: Apply over primary dressing as directed. Secured With: Transpore Surgical Tape, 2x10 (in/yd) 1 x Per Week Discharge Instructions: Secure dressing with tape as directed. Compression Wrap: ThreePress (3 layer compression wrap) 1 x Per Week Discharge Instructions: Apply three layer compression as directed. Wound #2 - Ankle Wound Laterality: Right, Lateral Cleanser: Soap and Water 1 x Per Week Discharge Instructions: May shower and wash wound with dial antibacterial soap and water prior to dressing change. Cleanser: Wound Cleanser 1 x Per Week Discharge Instructions: Cleanse the wound with wound cleanser prior to applying a clean dressing using gauze sponges, not tissue or cotton balls. Peri-Wound Care: Sween Lotion (Moisturizing lotion) 1 x Per Week Discharge Instructions: Apply moisturizing lotion as directed Prim Dressing: Sorbalgon AG Dressing 2x2 (in/in) 1 x Per Week ary Discharge Instructions: Apply to wound bed as instructed Secondary Dressing: Zetuvit Plus 4x8 in 1 x Per Week [name] ([id number]) [id number] Page 5 of 11 Discharge Instructions: Apply over primary dressing as directed. Secured With: Transpore Surgical Tape, 2x10 (in/yd) 1 x Per Week Discharge Instructions: Secure dressing with tape as directed. Compression Wrap: ThreePress (3 layer compression wrap) 1 x Per Week Discharge Instructions: Apply three layer compression  as directed. Electronic Signature(s) Signed: 12/01/2022 3:44:25 PM By: [name] MD FACS Entered By: [name] on 12/01/2022 15:03:48 -------------------------------------------------------------------------------- Problem List Details Patient Name: Date of Service: [name] RE, [name] 12/01/2022 2:00 PM Medical Record Number: [id number] Patient Account Number: [id number] Date of Birth/Sex: Treating RN: [birth year] (68 y.o. M) Primary Care Provider: Kathyrn Drown Other Clinician: Referring Provider: Treating Provider/Extender: Alean Rinne in Treatment: 3 Active Problems ICD-10 Encounter Code Description Active Date MDM Diagnosis L97.819 Non-pressure chronic ulcer of other part of right lower leg with unspecified 11/07/2022 No Yes severity L97.312 Non-pressure chronic ulcer of right ankle with fat layer exposed 11/07/2022 No Yes E11.622 Type 2 diabetes mellitus with other skin ulcer 11/07/2022 No Yes [id number] Chronic systolic (congestive) heart failure 11/07/2022 No Yes I73.9 Peripheral vascular disease, unspecified 11/07/2022 No Yes E11.42 Type 2 diabetes mellitus with diabetic polyneuropathy 11/07/2022 No Yes I63.9 Cerebral infarction, unspecified  11/07/2022 No Yes I10 Essential (primary) hypertension 11/07/2022 No Yes G89.4 Chronic pain syndrome 11/07/2022 No Yes Inactive Problems [name], [name] ([id number]) [phone].pdf Page 6 of 11 ICD-10 Code Description Active Date Inactive Date L97.512 Non-pressure chronic ulcer of other part of right foot with fat layer exposed 11/07/2022 11/07/2022 [id number] Type 2 diabetes mellitus with foot ulcer 11/07/2022 11/07/2022 Resolved Problems Electronic Signature(s) Signed: 12/01/2022 3:02:08 PM By: [name] MD FACS Entered By: [name] on 12/01/2022 15:02:08 -------------------------------------------------------------------------------- Progress Note Details Patient Name:  Date of Service: [name] RE, [name] 12/01/2022 2:00 PM Medical Record Number: [id number] Patient Account Number: [id number] Date of Birth/Sex: Treating RN: May 31, 1956 (67 y.o. M) Primary Care Provider: Kathyrn Drown Other Clinician: Referring Provider: Treating Provider/Extender: Alean Rinne in Treatment: 3 Subjective Chief Complaint Information obtained from Patient Patients presents for treatment of an open diabetic ulcer on his right foot, as well as ulcers on his right lower leg and ankle History of Present Illness (HPI) ADMISSION 11/07/2022 This is a 67 year old man with a past medical history significant for type 2 diabetes (last hemoglobin A1c 7.1), cerebrovascular occlusive disease, coronary artery disease, peripheral vascular disease, congestive heart failure, chronic pain syndrome, and prior amputation of his right great and second toes. He has a remote history of surgery on his right ankle and apparently as his legs have become increasingly more swollen and he has insisted upon wearing boots, the pressure and friction seem to have opened up wounds along the old scar line. In addition, he has a large callus on the third metatarsal head, related to his acquired foot deformity. He sees a podiatrist who periodically shaves this callus down and he was also recommended orthotics, but the patient has refused them because he likes his boots. There is an area of discoloration in the lower portion of this callus, suspicious for an open wound. 11/14/2022: The ulcer on his third metatarsal head has closed. The lateral leg ulcers are both about the same size, but he says that they feel better. There is slough accumulation in both sites. 11/23/2022: The ulcer on his foot remains closed. The lateral leg ulcers are still unchanged in size, but are a bit cleaner, just with some slough accumulation. 12/01/2022: No significant change to the size of the wounds on his lateral  leg. The larger is quite clean with good granulation tissue present. The smaller still has fairly thick slough buildup. Patient History Family History Cancer - Father,Mother,Siblings, Diabetes - Mother,Siblings, Heart Disease, Lung Disease - Father, No family history of Hereditary Spherocytosis, Hypertension, Kidney Disease, Seizures, Stroke, Thyroid Problems, Tuberculosis. Social History Former smoker - ended on 12/23/1970, Marital Status - Married, Alcohol Use - Never, Drug Use - No History, Caffeine Use - Daily. Medical History Eyes Denies history of Cataracts, Glaucoma, Optic Neuritis Ear/Nose/Mouth/Throat Denies history of Chronic sinus problems/congestion, Middle ear problems Respiratory Denies history of Aspiration, Asthma, Chronic Obstructive Pulmonary Disease (COPD), Pneumothorax, Sleep Apnea, Tuberculosis Cardiovascular Patient has history of Congestive Heart Failure, Hypertension, Peripheral Venous Disease Endocrine Patient has history of Type II Diabetes Musculoskeletal Patient has history of Osteoarthritis Neurologic Patient has history of Neuropathy Oncologic [name], [name] ([id number]) [phone].pdf Page 7 of 11 Denies history of Received Chemotherapy, Received Radiation Psychiatric Denies history of Anorexia/bulimia, Confinement Anxiety Medical A Surgical History Notes nd Cardiovascular cardiomyopathy hyperlipidemia hypokalemia stroke Genitourinary orchitis Objective Constitutional Slightly hypertensive. no acute distress. Vitals Time Taken: 2:10 PM, Height: 74 in, Weight: 252 lbs, BMI: 32.4, Temperature: 98.2  F, Pulse: 84 bpm, Respiratory Rate: 18 breaths/min, Blood Pressure: 144/75 mmHg. Respiratory Normal work of breathing on room air. General Notes: 12/01/2022: No significant change to the size of the wounds on his lateral leg. The larger is quite clean with good granulation tissue present. The smaller still has fairly thick  slough buildup. Integumentary (Hair, Skin) Wound #1 status is Open. Original cause of wound was Shear/Friction. The date acquired was: 09/16/2022. The wound has been in treatment 3 weeks. The wound is located on the Right,Lateral Lower Leg. The wound measures 5.7cm length x 1.3cm width x 0.5cm depth; 5.82cm^2 area and 2.91cm^3 volume. There is no tunneling or undermining noted. There is a medium amount of serosanguineous drainage noted. There is large (67-100%) red granulation within the wound bed. There is a small (1-33%) amount of necrotic tissue within the wound bed including Eschar and Adherent Slough. The periwound skin appearance had no abnormalities noted for moisture. The periwound skin appearance exhibited: Scarring, Hemosiderin Staining. Periwound temperature was noted as No Abnormality. Wound #2 status is Open. Original cause of wound was Shear/Friction. The date acquired was: 09/16/2022. The wound has been in treatment 3 weeks. The wound is located on the Right,Lateral Ankle. The wound measures 1.8cm length x 0.5cm width x 0.2cm depth; 0.707cm^2 area and 0.141cm^3 volume. There is Fat Layer (Subcutaneous Tissue) exposed. There is no tunneling or undermining noted. There is a medium amount of serous drainage noted. There is large (67- 100%) red granulation within the wound bed. There is a small (1-33%) amount of necrotic tissue within the wound bed including Eschar and Adherent Slough. The periwound skin appearance had no abnormalities noted for moisture. The periwound skin appearance exhibited: Scarring, Hemosiderin Staining. Periwound temperature was noted as No Abnormality. Assessment Active Problems ICD-10 Non-pressure chronic ulcer of other part of right lower leg with unspecified severity Non-pressure chronic ulcer of right ankle with fat layer exposed Type 2 diabetes mellitus with other skin ulcer Chronic systolic (congestive) heart failure Peripheral vascular disease,  unspecified Type 2 diabetes mellitus with diabetic polyneuropathy Cerebral infarction, unspecified Essential (primary) hypertension Chronic pain syndrome Procedures Wound #1 Pre-procedure diagnosis of Wound #1 is a Diabetic Wound/Ulcer of the Lower Extremity located on the Right,Lateral Lower Leg .Severity of Tissue Pre Debridement is: Fat layer exposed. There was a Selective/Open Wound Non-Viable Tissue Debridement with a total area of 7.41 sq cm performed by [name], MD. With the following instrument(s): Curette to remove Non-Viable tissue/material. Material removed includes [facility] after achieving pain control using Lidocaine 5% topical ointment. No specimens were taken. A time out was conducted at 14:21, prior to the start of the procedure. A Minimum amount of bleeding was controlled with Pressure. The procedure was tolerated well. Post Debridement Measurements: 5.7cm length x 1.3cm width x 0.5cm depth; 2.91cm^3 volume. Character of Wound/Ulcer Post Debridement requires further debridement. Severity of Tissue Post Debridement is: Fat layer exposed. Post procedure Diagnosis Wound #1: Same as Pre-Procedure General Notes: Scribed for Dr. Celine Ahr by Blanche East, RN. Pre-procedure diagnosis of Wound #1 is a Diabetic Wound/Ulcer of the Lower Extremity located on the Right,Lateral Lower Leg . There was a [address] [name], [name] ([id number]) [id number] Page 8 of 11 Compression Therapy Procedure by Blanche East, RN. Post procedure Diagnosis Wound #1: Same as Pre-Procedure Wound #2 Pre-procedure diagnosis of Wound #2 is a Diabetic Wound/Ulcer of the Lower Extremity located on the Right,Lateral Ankle .Severity of Tissue Pre Debridement is: Fat layer exposed. There was a Excisional Skin/Subcutaneous Tissue  Debridement with a total area of 0.9 sq cm performed by [name], MD. With the following instrument(s): Curette to remove Viable and Non-Viable  tissue/material. Material removed includes Subcutaneous Tissue and Slough and after achieving pain control using Lidocaine 5% topical ointment. No specimens were taken. A time out was conducted at 14:21, prior to the start of the procedure. A Minimum amount of bleeding was controlled with Pressure. The procedure was tolerated well. Post Debridement Measurements: 1.8cm length x 0.5cm width x 0.2cm depth; 0.141cm^3 volume. Character of Wound/Ulcer Post Debridement requires further debridement. Severity of Tissue Post Debridement is: Fat layer exposed. Post procedure Diagnosis Wound #2: Same as Pre-Procedure General Notes: Scribed for Dr. Celine Ahr by Blanche East, RN. Pre-procedure diagnosis of Wound #2 is a Diabetic Wound/Ulcer of the Lower Extremity located on the Right,Lateral Ankle . There was a Three Layer Compression Therapy Procedure by Blanche East, RN. Post procedure Diagnosis Wound #2: Same as Pre-Procedure Plan Follow-up Appointments: Return Appointment in 1 week. - Dr. Celine Ahr Rm 2 Anesthetic: Wound #1 Right,Lateral Lower Leg: (In clinic) Topical Lidocaine 4% applied to wound bed Wound #2 Right,Lateral Ankle: (In clinic) Topical Lidocaine 4% applied to wound bed Bathing/ Shower/ Hygiene: May shower with protection but do not get wound dressing(s) wet. Protect dressing(s) with water repellant cover (for example, large plastic bag) or a cast cover and may then take shower. - May purchase cast protector at [facility], [facility], or [facility] Edema Control - Lymphedema / SCD / Other: Elevate legs to the level of the heart or above for 30 minutes daily and/or when sitting for 3-4 times a day throughout the day. Avoid standing for long periods of time. WOUND #1: - Lower Leg Wound Laterality: Right, Lateral Cleanser: Soap and Water 1 x Per Week/ Discharge Instructions: May shower and wash wound with dial antibacterial soap and water prior to dressing change. Cleanser: Wound Cleanser 1 x Per  Week/ Discharge Instructions: Cleanse the wound with wound cleanser prior to applying a clean dressing using gauze sponges, not tissue or cotton balls. Peri-Wound Care: Sween Lotion (Moisturizing lotion) 1 x Per Week/ Discharge Instructions: Apply moisturizing lotion as directed Prim Dressing: Sorbalgon AG Dressing 2x2 (in/in) 1 x Per Week/ ary Discharge Instructions: Apply to wound bed as instructed Secondary Dressing: Zetuvit Plus 4x8 in 1 x Per Week/ Discharge Instructions: Apply over primary dressing as directed. Secured With: Transpore Surgical T ape, 2x10 (in/yd) 1 x Per Week/ Discharge Instructions: Secure dressing with tape as directed. Com pression Wrap: ThreePress (3 layer compression wrap) 1 x Per Week/ Discharge Instructions: Apply three layer compression as directed. WOUND #2: - Ankle Wound Laterality: Right, Lateral Cleanser: Soap and Water 1 x Per Week/ Discharge Instructions: May shower and wash wound with dial antibacterial soap and water prior to dressing change. Cleanser: Wound Cleanser 1 x Per Week/ Discharge Instructions: Cleanse the wound with wound cleanser prior to applying a clean dressing using gauze sponges, not tissue or cotton balls. Peri-Wound Care: Sween Lotion (Moisturizing lotion) 1 x Per Week/ Discharge Instructions: Apply moisturizing lotion as directed Prim Dressing: Sorbalgon AG Dressing 2x2 (in/in) 1 x Per Week/ ary Discharge Instructions: Apply to wound bed as instructed Secondary Dressing: Zetuvit Plus 4x8 in 1 x Per Week/ Discharge Instructions: Apply over primary dressing as directed. Secured With: Transpore Surgical T ape, 2x10 (in/yd) 1 x Per Week/ Discharge Instructions: Secure dressing with tape as directed. Com pression Wrap: ThreePress (3 layer compression wrap) 1 x Per Week/ Discharge Instructions: Apply three layer  compression as directed. 12/01/2022: No significant change to the size of the wounds on his lateral leg. The larger is quite  clean with good granulation tissue present. The smaller still has fairly thick slough buildup. I used a curette to debride slough from the large lateral leg wound and slough and nonviable subcutaneous tissue from the ankle wound. We will continue silver alginate to both sites. 3 layer compression. I think that once we get them cleaner, the larger wound might be amenable to a wound VAC, due to its depth. Follow-up in 1 week. Electronic Signature(s) Signed: 12/01/2022 3:04:53 PM By: [name] MD FACS Entered By: [name] on 12/01/2022 15:04:52 [name] ([id number]) [id number].pdf Page 9 of 11 -------------------------------------------------------------------------------- HxROS Details Patient Name: Date of Service: [place] RE, [name] 12/01/2022 2:00 PM Medical Record Number: [id number] Patient Account Number: [id number] Date of Birth/Sex: Treating RN: 1956/08/10 (67 y.o. M) Primary Care Provider: Kathyrn Drown Other Clinician: Referring Provider: Treating Provider/Extender: Alean Rinne in Treatment: 3 Eyes Medical History: Negative for: Cataracts; Glaucoma; Optic Neuritis Ear/Nose/Mouth/Throat Medical History: Negative for: Chronic sinus problems/congestion; Middle ear problems Respiratory Medical History: Negative for: Aspiration; Asthma; Chronic Obstructive Pulmonary Disease (COPD); Pneumothorax; Sleep Apnea; Tuberculosis Cardiovascular Medical History: Positive for: Congestive Heart Failure; Hypertension; Peripheral Venous Disease Past Medical History Notes: cardiomyopathy hyperlipidemia hypokalemia stroke Endocrine Medical History: Positive for: Type II Diabetes Time with diabetes: 20 years Treated with: Insulin, Oral agents Genitourinary Medical History: Past Medical History Notes: orchitis Musculoskeletal Medical History: Positive for: Osteoarthritis Neurologic Medical History: Positive  for: Neuropathy Oncologic Medical History: Negative for: Received Chemotherapy; Received Radiation Psychiatric Medical History: Negative for: Anorexia/bulimia; Confinement Anxiety Immunizations Pneumococcal Vaccine: Received Pneumococcal Vaccination: Yes Received Pneumococcal Vaccination On or After [address][name], [name] ([id number]) [phone].pdf Page 10 of 11 Implantable Devices No devices added Family and Social History Cancer: Yes - Father,Mother,Siblings; Diabetes: Yes - Mother,Siblings; Heart Disease: Yes; Hereditary Spherocytosis: No; Hypertension: No; Kidney Disease: No; Lung Disease: Yes - Father; Seizures: No; Stroke: No; Thyroid Problems: No; Tuberculosis: No; Former smoker - ended on 12/23/1970; Marital Status - Married; Alcohol Use: Never; Drug Use: No History; Caffeine Use: Daily; Financial Concerns: No; Food, Clothing or Shelter Needs: No; Support System Lacking: No; Transportation Concerns: No Electronic Signature(s) Signed: 12/01/2022 3:44:25 PM By: [name] MD FACS Entered By: [name] on 12/01/2022 15:03:11 -------------------------------------------------------------------------------- SuperBill Details Patient Name: Date of Service: [name] RE, [name] 12/01/2022 Medical Record Number: [id number] Patient Account Number: [id number] Date of Birth/Sex: Treating RN: 1956-09-22 (67 y.o. M) Primary Care Provider: Kathyrn Drown Other Clinician: Referring Provider: Treating Provider/Extender: Alean Rinne in Treatment: 3 Diagnosis Coding ICD-10 Codes Code Description [phone] Non-pressure chronic ulcer of other part of right lower leg with unspecified severity L97.312 Non-pressure chronic ulcer of right ankle with fat layer exposed E11.622 Type 2 diabetes mellitus with other skin ulcer [id number] Chronic systolic (congestive) heart failure I73.9 Peripheral vascular disease, unspecified E11.42  Type 2 diabetes mellitus with diabetic polyneuropathy I63.9 Cerebral infarction, unspecified I10 Essential (primary) hypertension G89.4 Chronic pain syndrome Facility Procedures : CPT4 Code: [id number] Description: 11042 - DEB SUBQ TISSUE 20 SQ CM/< ICD-10 Diagnosis Description L97.819 Non-pressure chronic ulcer of other part of right lower leg with unspecified seve Modifier: rity Quantity: 1 : CPT4 Code: [id number] Description: [id number] - DEBRIDE WOUND 1ST 20 SQ CM OR < ICD-10 Diagnosis Description L97.312 Non-pressure chronic ulcer of right ankle with fat layer exposed Modifier:  Quantity: 1 Physician Procedures : CPT4 Code Description Modifier [id number] - WC PHYS LEVEL 3 - EST PT 25 ICD-10 Diagnosis Description L97.819 Non-pressure chronic ulcer of other part of right lower leg with unspecified severity L97.312 Non-pressure chronic ulcer of right ankle with  fat layer exposed E11.622 Type 2 diabetes mellitus with other skin ulcer I73.9 Peripheral vascular disease, unspecified Quantity: 1 : [id number] 11042 - WC PHYS SUBQ TISS 20 SQ CM ICD-10 Diagnosis Description L97.819 Non-pressure chronic ulcer of other part of right lower leg with unspecified severity [name], [name] ([id number]) [phone] [id number] - WC PHYS  DEBR WO ANESTH 20 SQ CM 1 ICD-10 Diagnosis Description L97.312 Non-pressure chronic ulcer of right ankle with fat layer exposed Quantity: 1 27.pdf Page 11 of 11 Electronic Signature(s) Signed: 12/01/2022 3:05:53 PM By: [name] MD FACS Entered By: [name] on 12/01/2022 15:05:53

## 2022-12-03 NOTE — Progress Notes (Signed)
[name] ([id number]) [id number] Page 1 of 10 Visit Report for 12/01/2022 Arrival Information Details Patient Name: Date of Service: [name], [name] 12/01/2022 2:00 PM Medical Record Number: [id number] Patient Account Number: [id number] Date of Birth/Sex: Treating RN: 18-Mar-1956 (67 y.o. Waldron Session Primary Care Zethan Alfieri: Kathyrn Drown Other Clinician: Referring Jackey Housey: Treating Tanicka Bisaillon/Extender: Alean Rinne in Treatment: 3 Visit Information History Since Last Visit Added or deleted any medications: No Patient Arrived: Ambulatory Any new allergies or adverse reactions: No Arrival Time: 14:08 Had a fall or experienced change in No Accompanied By: wife activities of daily living that may affect Transfer Assistance: None risk of falls: Patient Requires Transmission-Based Precautions: No Signs or symptoms of abuse/neglect since last visito No Patient Has Alerts: Yes Hospitalized since last visit: No Patient Alerts: ABI RLE Bridger Implantable device outside of the clinic excluding No cellular tissue based products placed in the center since last visit: Has Dressing in Place as Prescribed: Yes Pain Present Now: No Electronic Signature(s) Signed: 12/01/2022 5:14:21 PM By: Blanche East RN Entered By: Blanche East on 12/01/2022 14:09:27 -------------------------------------------------------------------------------- Compression Therapy Details Patient Name: Date of Service: [name], [name] 12/01/2022 2:00 PM Medical Record Number: [id number] Patient Account Number: [id number] Date of Birth/Sex: Treating RN: 1956/06/20 (67 y.o. Waldron Session Primary Care Crescencio Jozwiak: Kathyrn Drown Other Clinician: Referring Conley Delisle: Treating Vivi Piccirilli/Extender: Alean Rinne in Treatment: 3 Compression Therapy Performed for Wound Assessment: Wound #1 Right,Lateral Lower Leg Performed By: Clinician  Blanche East, RN Compression Type: Three Layer Post Procedure Diagnosis Same as Pre-procedure Electronic Signature(s) Signed: 12/01/2022 5:14:21 PM By: Blanche East RN Entered By: Blanche East on 12/01/2022 14:25:37 [name] ([id number].pdf Page 2 of 10 -------------------------------------------------------------------------------- Compression Therapy Details Patient Name: Date of Service: [name] 12/01/2022 2:00 PM Medical Record Number: [id number] Patient Account Number: [id number] Date of Birth/Sex: Treating RN: 09-22-1956 (67 y.o. Waldron Session Primary Care Alyus Mofield: Kathyrn Drown Other Clinician: Referring Demontrae Gilbert: Treating Jerson Furukawa/Extender: Alean Rinne in Treatment: 3 Compression Therapy Performed for Wound Assessment: Wound #2 Right,Lateral Ankle Performed By: Clinician Blanche East, RN Compression Type: Three Layer Post Procedure Diagnosis Same as Pre-procedure Electronic Signature(s) Signed: 12/01/2022 5:14:21 PM By: Blanche East RN Entered By: Blanche East on 12/01/2022 14:25:37 -------------------------------------------------------------------------------- Encounter Discharge Information Details Patient Name: Date of Service: [place] [name], [name] 12/01/2022 2:00 PM Medical Record Number: [id number] Patient Account Number: [id number] Date of Birth/Sex: Treating RN: Apr 18, 1956 (67 y.o. Waldron Session Primary Care Lavone Weisel: Kathyrn Drown Other Clinician: Referring Dawnette Mione: Treating Lanyiah Brix/Extender: Alean Rinne in Treatment: 3 Encounter Discharge Information Items Post Procedure Vitals Discharge Condition: Stable Temperature (F): 98.2 Ambulatory Status: Ambulatory Pulse (bpm): 84 Discharge Destination: Home Respiratory Rate (breaths/min): 18 Transportation: Private Auto Blood Pressure (mmHg): 144/75 Accompanied By: self Schedule Follow-up  Appointment: Yes Clinical Summary of Care: Electronic Signature(s) Signed: 12/01/2022 4:43:33 PM By: Blanche East RN Entered By: Blanche East on 12/01/2022 16:43:33 -------------------------------------------------------------------------------- Lower Extremity Assessment Details Patient Name: Date of Service: [name] 12/01/2022 2:00 PM Medical Record Number: [id number] Patient Account Number: [id number] Date of Birth/Sex: Treating RN: October 09, 1956 (67 y.o. Waldron Session Primary Care Nadina Fomby: Kathyrn Drown Other Clinician: Referring Trayce Caravello: Treating Wilder Kurowski/Extender: Hart Robinsons A Weeks in Treatment: 3 Edema Assessment Assessed: [Left: No] Patrice Paradise: No] [Left: Edema] Patrice Paradise: Chapman Moss ([id number]) [id number] Page  3 of 10 Left: Right: Point of Measurement: From Medial Instep 40.4 cm Ankle Left: Right: Point of Measurement: From Medial Instep 28 cm Vascular Assessment Pulses: Dorsalis Pedis Palpable: [Right:Yes] Electronic Signature(s) Signed: 12/01/2022 5:14:21 PM By: Blanche East RN Entered By: Blanche East on 12/01/2022 14:12:35 -------------------------------------------------------------------------------- Multi Wound Chart Details Patient Name: Date of Service: [place] [name], [name] 12/01/2022 2:00 PM Medical Record Number: [id number] Patient Account Number: [id number] Date of Birth/Sex: Treating RN: [birth year] (67 y.o. M) Primary Care Coralyn Roselli: Kathyrn Drown Other Clinician: Referring Jabrea Kallstrom: Treating Miqueas Whilden/Extender: Kristine Royal, Scott A Weeks in Treatment: 3 Vital Signs Height(in): 74 Pulse(bpm): 84 Weight(lbs): 252 Blood Pressure(mmHg): 144/75 Body Mass Index(BMI): 32.4 Temperature(F): 98.2 Respiratory Rate(breaths/min): 18 [1:Photos:] [N/A:N/A] Right, Lateral Lower Leg Right, Lateral Ankle N/A Wound Location: Shear/Friction Shear/Friction N/A Wounding  Event: Diabetic Wound/Ulcer of the Lower Diabetic Wound/Ulcer of the Lower N/A Primary Etiology: Extremity Extremity Congestive Heart Failure, Congestive Heart Failure, N/A Comorbid History: Hypertension, Peripheral Venous Hypertension, Peripheral Venous Disease, Type II Diabetes, Disease, Type II Diabetes, Osteoarthritis, Neuropathy Osteoarthritis, Neuropathy 09/16/2022 09/16/2022 N/A Date Acquired: 3 3 N/A Weeks of Treatment: Open Open N/A Wound Status: No No N/A Wound Recurrence: 5.7x1.3x0.5 1.8x0.5x0.2 N/A Measurements L x W x D (cm) 5.82 0.707 N/A A (cm) : rea 2.91 0.141 N/A Volume (cm) : -37.20% 0.00% N/A % Reduction in A rea: -128.80% 0.00% N/A % Reduction in Volume: Grade 1 Grade 1 N/A Classification: Medium Medium N/A Exudate A mount: Serosanguineous Serous N/A Exudate Type: red, brown amber N/A Exudate Color: Large (67-100%) Large (67-100%) N/A Granulation A mount: Red Red N/A Granulation QualityJOVI, ASIS ([id number]) [id number] Page 4 of 10 Small (1-33%) Small (1-33%) N/A Necrotic Amount: Eschar, Adherent Slough Eschar, Adherent Slough N/A Necrotic Tissue: Fascia: No Fat Layer (Subcutaneous Tissue): Yes N/A Exposed Structures: Fat Layer (Subcutaneous Tissue): No Fascia: No Tendon: No Tendon: No Muscle: No Muscle: No Joint: No Joint: No Bone: No Bone: No Small (1-33%) Small (1-33%) N/A Epithelialization: Debridement - Selective/Open Wound Debridement - Excisional N/A Debridement: Pre-procedure Verification/Time Out 14:21 14:21 N/A Taken: Lidocaine 5% topical ointment Lidocaine 5% topical ointment N/A Pain Control: Slough Subcutaneous, Slough N/A Tissue Debrided: Non-Viable Tissue Skin/Subcutaneous Tissue N/A Level: 7.41 0.9 N/A Debridement A (sq cm): rea Curette Curette N/A Instrument: Minimum Minimum N/A Bleeding: Pressure Pressure N/A Hemostasis A chieved: Procedure was tolerated well  Procedure was tolerated well N/A Debridement Treatment Response: 5.7x1.3x0.5 1.8x0.5x0.2 N/A Post Debridement Measurements L x W x D (cm) 2.91 0.141 N/A Post Debridement Volume: (cm) Scarring: Yes Scarring: Yes N/A Periwound Skin Texture: No Abnormalities Noted No Abnormalities Noted N/A Periwound Skin Moisture: Hemosiderin Staining: Yes Hemosiderin Staining: Yes N/A Periwound Skin Color: No Abnormality No Abnormality N/A Temperature: Compression Therapy Compression Therapy N/A Procedures Performed: Debridement Debridement Treatment Notes Electronic Signature(s) Signed: 12/01/2022 3:02:21 PM By: [name] MD FACS Entered By: [name] on 12/01/2022 15:02:21 -------------------------------------------------------------------------------- Multi-Disciplinary Care Plan Details Patient Name: Date of Service: [place] [name], [name] 12/01/2022 2:00 PM Medical Record Number: [id number] Patient Account Number: [id number] Date of Birth/Sex: Treating RN: 1956-02-02 (67 y.o. Waldron Session Primary Care Larosa Rhines: Kathyrn Drown Other Clinician: Referring Raj Landress: Treating Krzysztof Reichelt/Extender: Alean Rinne in Treatment: 3 Multidisciplinary Care Plan reviewed with physician Active Inactive Venous Leg Ulcer Nursing Diagnoses: Knowledge deficit related to disease process and management Potential for venous Insuffiency (use before diagnosis confirmed) Goals: Patient will maintain optimal edema control Date Initiated: 11/23/2022 Target Resolution Date: 12/15/2022  Goal Status: Active Interventions: Assess peripheral edema status every visit. Compression as ordered Treatment Activities: Therapeutic compression applied : 11/23/2022 Notes: REAGAN, SASS ([id number]) [phone].pdf Page 5 of 10 Wound/Skin Impairment Nursing Diagnoses: Impaired tissue integrity Goals: Patient/caregiver will verbalize understanding of skin care  regimen Date Initiated: 11/23/2022 Target Resolution Date: 12/15/2022 Goal Status: Active Ulcer/skin breakdown will have a volume reduction of 30% by week 4 Date Initiated: 11/07/2022 Target Resolution Date: 12/15/2022 Goal Status: Active Interventions: Assess patient/caregiver ability to obtain necessary supplies Assess patient/caregiver ability to perform ulcer/skin care regimen upon admission and as needed Assess ulceration(s) every visit Treatment Activities: Skin care regimen initiated : 11/07/2022 Topical wound management initiated : 11/07/2022 Notes: Electronic Signature(s) Signed: 12/01/2022 5:14:21 PM By: Blanche East RN Entered By: Blanche East on 12/01/2022 14:26:09 -------------------------------------------------------------------------------- Pain Assessment Details Patient Name: Date of Service: Barbera Setters D. 12/01/2022 2:00 PM Medical Record Number: [id number] Patient Account Number: [id number] Date of Birth/Sex: Treating RN: January 13, 1956 (67 y.o. Waldron Session Primary Care Albirda Shiel: Kathyrn Drown Other Clinician: Referring Ronte Parker: Treating Nazaiah Navarrete/Extender: Alean Rinne in Treatment: 3 Active Problems Location of Pain Severity and Description of Pain Patient Has Paino Yes Site Locations Rate the pain. Current Pain Level: 5 Character of Pain Describe the Pain: Burning Pain Management and Medication Current Pain Management: KHRYSTOPHER, MCALPINE ([id number]) [phone].pdf Page 6 of 10 Electronic Signature(s) Signed: 12/01/2022 5:14:21 PM By: Blanche East RN Entered By: Blanche East on 12/01/2022 14:11:17 -------------------------------------------------------------------------------- Patient/Caregiver Education Details Patient Name: Date of Service: [place] [name], [name] 2/15/2024andnbsp2:00 PM Medical Record Number: [id number] Patient Account Number: [id number] Date of Birth/Gender: Treating RN: 13-Sep-1956 (67  y.o. Waldron Session Primary Care Physician: Kathyrn Drown Other Clinician: Referring Physician: Treating Physician/Extender: Alean Rinne in Treatment: 3 Education Assessment Education Provided To: Patient Education Topics Provided Wound/Skin Impairment: Methods: Explain/Verbal Responses: Reinforcements needed, State content correctly Electronic Signature(s) Signed: 12/01/2022 5:14:21 PM By: Blanche East RN Entered By: Blanche East on 12/01/2022 14:26:28 -------------------------------------------------------------------------------- Wound Assessment Details Patient Name: Date of Service: [place] [name], [name] 12/01/2022 2:00 PM Medical Record Number: [id number] Patient Account Number: [id number] Date of Birth/Sex: Treating RN: 1955-12-26 (67 y.o. Waldron Session Primary Care Makenzie Weisner: Kathyrn Drown Other Clinician: Referring Ithzel Fedorchak: Treating Saprina Chuong/Extender: Kristine Royal, Scott A Weeks in Treatment: 3 Wound Status Wound Number: 1 Primary Diabetic Wound/Ulcer of the Lower Extremity Etiology: Wound Location: Right, Lateral Lower Leg Wound Open Wounding Event: Shear/Friction Status: Date Acquired: 09/16/2022 Comorbid Congestive Heart Failure, Hypertension, Peripheral Venous Weeks Of Treatment: 3 History: Disease, Type II Diabetes, Osteoarthritis, Neuropathy Clustered Wound: No Photos ELIUS, CHIPLEY ([id number]) [phone].pdf Page 7 of 10 Wound Measurements Length: (cm) 5.7 Width: (cm) 1.3 Depth: (cm) 0.5 Area: (cm) 5.82 Volume: (cm) 2.91 % Reduction in Area: -37.2% % Reduction in Volume: -128.8% Epithelialization: Small (1-33%) Tunneling: No Undermining: No Wound Description Classification: Grade 1 Exudate Amount: Medium Exudate Type: Serosanguineous Exudate Color: red, brown Wound Bed Granulation Amount: Large (67-100%) Exposed Structure Granulation Quality: Red Fascia Exposed: No Necrotic  Amount: Small (1-33%) Fat Layer (Subcutaneous Tissue) Exposed: No Necrotic Quality: Eschar, Adherent Slough Tendon Exposed: No Muscle Exposed: No Joint Exposed: No Bone Exposed: No Periwound Skin Texture Texture Color No Abnormalities Noted: No No Abnormalities Noted: No Scarring: Yes Hemosiderin Staining: Yes Moisture Temperature / Pain No Abnormalities Noted: Yes Temperature: No Abnormality Treatment Notes Wound #1 (Lower Leg) Wound Laterality: Right, Lateral Cleanser Soap and Water Discharge  Instruction: May shower and wash wound with dial antibacterial soap and water prior to dressing change. Wound Cleanser Discharge Instruction: Cleanse the wound with wound cleanser prior to applying a clean dressing using gauze sponges, not tissue or cotton balls. Peri-Wound Care Sween Lotion (Moisturizing lotion) Discharge Instruction: Apply moisturizing lotion as directed Topical Primary Dressing Sorbalgon AG Dressing 2x2 (in/in) Discharge Instruction: Apply to wound bed as instructed Secondary Dressing Zetuvit Plus 4x8 in Discharge Instruction: Apply over primary dressing as directed. Secured With Transpore Surgical Tape, 2x10 (in/yd) Discharge Instruction: Secure dressing with tape as directed. Compression Wrap ThreePress (3 layer compression wrap) Discharge Instruction: Apply three layer compression as directed. MONDARIUS, LANSON ([id number]) [id number] Page 8 of 10 Compression Stockings Add-Ons Electronic Signature(s) Signed: 12/01/2022 5:14:21 PM By: Blanche East RN Signed: 12/01/2022 5:25:03 PM By: Dellie Catholic RN Entered By: Dellie Catholic on 12/01/2022 14:16:59 -------------------------------------------------------------------------------- Wound Assessment Details Patient Name: Date of Service: [place] [name], [name] 12/01/2022 2:00 PM Medical Record Number: [id number] Patient Account Number: [id number] Date of Birth/Sex: Treating RN: [birth year]  (67 y.o. Waldron Session Primary Care Alexia Dinger: Kathyrn Drown Other Clinician: Referring Acea Yagi: Treating Preciosa Bundrick/Extender: Monna Fam Weeks in Treatment: 3 Wound Status Wound Number: 2 Primary Diabetic Wound/Ulcer of the Lower Extremity Etiology: Wound Location: Right, Lateral Ankle Wound Open Wounding Event: Shear/Friction Status: Date Acquired: 09/16/2022 Comorbid Congestive Heart Failure, Hypertension, Peripheral Venous Weeks Of Treatment: 3 History: Disease, Type II Diabetes, Osteoarthritis, Neuropathy Clustered Wound: No Photos Wound Measurements Length: (cm) 1.8 Width: (cm) 0.5 Depth: (cm) 0.2 Area: (cm) 0.707 Volume: (cm) 0.141 % Reduction in Area: 0% % Reduction in Volume: 0% Epithelialization: Small (1-33%) Tunneling: No Undermining: No Wound Description Classification: Exudate Amount: Exudate Type: Exudate Color: Grade 1 Medium Serous amber Wound Bed Granulation Amount: Large (67-100%) Exposed Structure Granulation Quality: Red Fascia Exposed: No Necrotic Amount: Small (1-33%) Fat Layer (Subcutaneous Tissue) Exposed: Yes Necrotic Quality: Eschar, Adherent Slough Tendon Exposed: No Muscle Exposed: No Joint Exposed: No Bone Exposed: No Periwound Skin Texture Texture Color No Abnormalities Noted: No No Abnormalities Noted: No PAXTON, CARLONE ([id number] Page 9 of 10 Scarring: Yes Hemosiderin Staining: Yes Moisture Temperature / Pain No Abnormalities Noted: Yes Temperature: No Abnormality Treatment Notes Wound #2 (Ankle) Wound Laterality: Right, Lateral Cleanser Soap and Water Discharge Instruction: May shower and wash wound with dial antibacterial soap and water prior to dressing change. Wound Cleanser Discharge Instruction: Cleanse the wound with wound cleanser prior to applying a clean dressing using gauze sponges, not tissue or cotton balls. Peri-Wound Care Sween Lotion  (Moisturizing lotion) Discharge Instruction: Apply moisturizing lotion as directed Topical Primary Dressing Sorbalgon AG Dressing 2x2 (in/in) Discharge Instruction: Apply to wound bed as instructed Secondary Dressing Zetuvit Plus 4x8 in Discharge Instruction: Apply over primary dressing as directed. Secured With Transpore Surgical Tape, 2x10 (in/yd) Discharge Instruction: Secure dressing with tape as directed. Compression Wrap ThreePress (3 layer compression wrap) Discharge Instruction: Apply three layer compression as directed. Compression Stockings Add-Ons Electronic Signature(s) Signed: 12/01/2022 5:14:21 PM By: Blanche East RN Signed: 12/01/2022 5:25:03 PM By: Dellie Catholic RN Entered By: Dellie Catholic on 12/01/2022 14:17:36 -------------------------------------------------------------------------------- Vitals Details Patient Name: Date of Service: [place] [name], [name] 12/01/2022 2:00 PM Medical Record Number: [id number] Patient Account Number: [id number] Date of Birth/Sex: Treating RN: 06-17-1956 (67 y.o. Waldron Session Primary Care Tesneem Dufrane: Kathyrn Drown Other Clinician: Referring Tomica Arseneault: Treating Johneric Mcfadden/Extender: Monna Fam Weeks in Treatment: 3 Vital Signs Time Taken:  14:10 Temperature (F): 98.2 Height (in): 74 Pulse (bpm): 84 Weight (lbs): 252 Respiratory Rate (breaths/min): 18 Body Mass Index (BMI): 32.4 Blood Pressure (mmHg): 144/75 Reference Range: 80 - 120 mg / dl Electronic Signature(s) Signed: 12/01/2022 5:14:21 PM By: Blanche East RN Entered By: Blanche East on 12/01/2022 14:10:58 [name] ([id number]) [phone].pdf Page 10 of 10

## 2022-12-07 ENCOUNTER — Ambulatory Visit: Payer: Medicare HMO | Admitting: Neurology

## 2022-12-07 DIAGNOSIS — R4182 Altered mental status, unspecified: Secondary | ICD-10-CM | POA: Diagnosis not present

## 2022-12-07 DIAGNOSIS — R413 Other amnesia: Secondary | ICD-10-CM

## 2022-12-09 ENCOUNTER — Encounter (HOSPITAL_BASED_OUTPATIENT_CLINIC_OR_DEPARTMENT_OTHER): Payer: Medicare HMO | Admitting: General Surgery

## 2022-12-09 DIAGNOSIS — E11622 Type 2 diabetes mellitus with other skin ulcer: Secondary | ICD-10-CM | POA: Diagnosis not present

## 2022-12-09 DIAGNOSIS — G894 Chronic pain syndrome: Secondary | ICD-10-CM | POA: Diagnosis not present

## 2022-12-09 DIAGNOSIS — Z8673 Personal history of transient ischemic attack (TIA), and cerebral infarction without residual deficits: Secondary | ICD-10-CM | POA: Diagnosis not present

## 2022-12-09 DIAGNOSIS — I5022 Chronic systolic (congestive) heart failure: Secondary | ICD-10-CM | POA: Diagnosis not present

## 2022-12-09 DIAGNOSIS — L97312 Non-pressure chronic ulcer of right ankle with fat layer exposed: Secondary | ICD-10-CM | POA: Diagnosis not present

## 2022-12-09 DIAGNOSIS — L97812 Non-pressure chronic ulcer of other part of right lower leg with fat layer exposed: Secondary | ICD-10-CM | POA: Diagnosis not present

## 2022-12-09 DIAGNOSIS — I110 Hypertensive heart disease with heart failure: Secondary | ICD-10-CM | POA: Diagnosis not present

## 2022-12-09 DIAGNOSIS — E11621 Type 2 diabetes mellitus with foot ulcer: Secondary | ICD-10-CM | POA: Diagnosis not present

## 2022-12-09 DIAGNOSIS — E1142 Type 2 diabetes mellitus with diabetic polyneuropathy: Secondary | ICD-10-CM | POA: Diagnosis not present

## 2022-12-09 DIAGNOSIS — E1151 Type 2 diabetes mellitus with diabetic peripheral angiopathy without gangrene: Secondary | ICD-10-CM | POA: Diagnosis not present

## 2022-12-09 DIAGNOSIS — L97512 Non-pressure chronic ulcer of other part of right foot with fat layer exposed: Secondary | ICD-10-CM | POA: Diagnosis not present

## 2022-12-09 NOTE — Progress Notes (Signed)
Kindly inform the patient that EEG or brainwave study showed moderate slowing of the brain wave activity which is seen often in patients with dementia and memory loss.  No seizure activity noted.

## 2022-12-10 NOTE — Progress Notes (Signed)
[name] ([id number]) [id number] Page 1 of 8 Visit Report for 12/09/2022 Arrival Information Details Patient Name: Date of Service: [name] 12/09/2022 11:00 A M Medical Record Number: [id number] Patient Account Number: [id number] Date of Birth/Sex: Treating RN: 07-23-1956 (67 y.o. M) Primary Care Miriana Gaertner: Kathyrn Drown Other Clinician: Referring Agusta Hackenberg: Treating Hellena Pridgen/Extender: Alean Rinne in Treatment: 4 Visit Information History Since Last Visit All ordered tests and consults were completed: No Patient Arrived: Ambulatory Added or deleted any medications: No Arrival Time: 11:16 Any new allergies or adverse reactions: No Accompanied By: wife Had a fall or experienced change in No Transfer Assistance: None activities of daily living that may affect Patient Identification Verified: Yes risk of falls: Secondary Verification Process Completed: Yes Signs or symptoms of abuse/neglect since last visito No Patient Requires Transmission-Based Precautions: No Hospitalized since last visit: No Patient Has Alerts: Yes Implantable device outside of the clinic excluding No Patient Alerts: ABI RLE [place] cellular tissue based products placed in the center since last visit: Pain Present Now: No Electronic Signature(s) Signed: 12/09/2022 2:18:46 PM By: [name] Entered By: [name] on 12/09/2022 11:16:59 -------------------------------------------------------------------------------- Compression Therapy Details Patient Name: Date of Service: [name] 12/09/2022 11:00 A M Medical Record Number: [id number] Patient Account Number: [id number] Date of Birth/Sex: Treating RN: Jan 10, 1956 (67 y.o. [name] Primary Care Mekiah Cambridge: Kathyrn Drown Other Clinician: Referring Eugene Isadore: Treating Dezra Mandella/Extender: Monna Fam Weeks in Treatment: 4 Compression Therapy Performed for  Wound Assessment: Wound #1 Right,Lateral Lower Leg Performed By: Clinician Dellie Catholic, RN Compression Type: Three Layer Post Procedure Diagnosis Same as Pre-procedure Electronic Signature(s) Signed: 12/09/2022 12:21:52 PM By: Dellie Catholic RN Entered By: Dellie Catholic on 12/09/2022 12:17:20 -------------------------------------------------------------------------------- Encounter Discharge Information Details Patient Name: Date of Service: [name] Hall RE, [name] 12/09/2022 11:00 A M Medical Record Number: [id number] Patient Account Number: [id number] Date of Birth/Sex: Treating RN: [birth year] (67 y.o. [name] Primary Care Alenah Sarria: Kathyrn Drown Other Clinician: Referring Cataldo Cosgriff: Treating Nanetta Wiegman/Extender: Alean Rinne in Treatment: 4 Encounter Discharge Information Items Post Procedure Vitals Discharge Condition: Stable Temperature (F): 98.7 Ambulatory Status: Ambulatory Pulse (bpm): 90 Discharge Destination: Home Respiratory Rate (breaths/min): 20 Transportation: Private Auto Blood Pressure (mmHg): 136/71 Accompanied By: spouse [name] ([id number]) [id number] Page 2 of 8 Schedule Follow-up Appointment: Yes Clinical Summary of Care: Patient Declined Electronic Signature(s) Signed: 12/09/2022 12:21:52 PM By: Dellie Catholic RN Entered By: Dellie Catholic on 12/09/2022 12:20:29 -------------------------------------------------------------------------------- Lower Extremity Assessment Details Patient Name: Date of Service: [name] 12/09/2022 11:00 A M Medical Record Number: [id number] Patient Account Number: [id number] Date of Birth/Sex: Treating RN: 27-Apr-1956 (67 y.o. [name] Primary Care Paisley Grajeda: Kathyrn Drown Other Clinician: Referring Domingue Coltrain: Treating Wenceslaus Gist/Extender: Kristine Royal, Scott A Weeks in Treatment: 4 Edema Assessment Assessed: [Left: No]  [Right: No] [Left: Edema] [Right: :] Calf Left: Right: Point of Measurement: From Medial Instep 40.4 cm Ankle Left: Right: Point of Measurement: From Medial Instep 28 cm Vascular Assessment Pulses: Dorsalis Pedis Palpable: [Right:Yes] Electronic Signature(s) Signed: 12/09/2022 12:21:52 PM By: Dellie Catholic RN Entered By: Dellie Catholic on 12/09/2022 11:34:33 -------------------------------------------------------------------------------- Multi Wound Chart Details Patient Name: Date of Service: [name], [name] 12/09/2022 11:00 A M Medical Record Number: [id number] Patient Account Number: [id number] Date of Birth/Sex: Treating RN: 1956-06-27 (67 y.o. M) Primary Care Aylana Hirschfeld: Kathyrn Drown Other Clinician: Referring Celso Granja: Treating Damarko Stitely/Extender:  [name] Wolfgang Phoenix, Scott A Weeks in Treatment: 4 Vital Signs Height(in): 74 Capillary Blood Glucose(mg/dl): 117 Weight(lbs): 252 Pulse(bpm): 90 Body Mass Index(BMI): 32.4 Blood Pressure(mmHg): 136/71 Temperature(F): 98.7 Respiratory Rate(breaths/min): 20 [1:Photos:] [N/A:N/A [id number] Page 3 of 8] Right, Lateral Lower Leg Right, Lateral Ankle N/A Wound Location: Shear/Friction Shear/Friction N/A Wounding Event: Diabetic Wound/Ulcer of the Lower Diabetic Wound/Ulcer of the Lower N/A Primary Etiology: Extremity Extremity Congestive Heart Failure, Congestive Heart Failure, N/A Comorbid History: Hypertension, Peripheral Venous Hypertension, Peripheral Venous Disease, Type II Diabetes, Disease, Type II Diabetes, Osteoarthritis, Neuropathy Osteoarthritis, Neuropathy 09/16/2022 09/16/2022 N/A Date Acquired: 4 4 N/A Weeks of Treatment: Open Open N/A Wound Status: No No N/A Wound Recurrence: 4.8x1x0.5 1.5x0.5x0.2 N/A Measurements L x W x D (cm) 3.77 0.589 N/A A (cm) : rea 1.885 0.118 N/A Volume (cm) : 11.10% 16.70% N/A % Reduction in A rea: -48.20% 16.30% N/A % Reduction  in Volume: Grade 1 Grade 1 N/A Classification: Medium Medium N/A Exudate A mount: Serosanguineous Serous N/A Exudate Type: red, brown amber N/A Exudate Color: Large (67-100%) Large (67-100%) N/A Granulation A mount: Red Red N/A Granulation Quality: Small (1-33%) Small (1-33%) N/A Necrotic A mount: Eschar, Adherent Slough Eschar, Adherent Slough N/A Necrotic Tissue: Fascia: No Fat Layer (Subcutaneous Tissue): Yes N/A Exposed Structures: Fat Layer (Subcutaneous Tissue): No Fascia: No Tendon: No Tendon: No Muscle: No Muscle: No Joint: No Joint: No Bone: No Bone: No Small (1-33%) Small (1-33%) N/A Epithelialization: Debridement - Excisional Debridement - Excisional N/A Debridement: Pre-procedure Verification/Time Out 11:38 11:38 N/A Taken: Lidocaine 4% Topical Solution Lidocaine 4% Topical Solution N/A Pain Control: Subcutaneous, Slough Subcutaneous, Slough N/A Tissue Debrided: Skin/Subcutaneous Tissue Skin/Subcutaneous Tissue N/A Level: 4.8 0.75 N/A Debridement A (sq cm): rea Curette Curette N/A Instrument: Minimum Minimum N/A Bleeding: Pressure Pressure N/A Hemostasis A chieved: 0 0 N/A Procedural Pain: 0 0 N/A Post Procedural Pain: Procedure was tolerated well Procedure was tolerated well N/A Debridement Treatment Response: 4.8x1x0.5 1.5x0.5x0.2 N/A Post Debridement Measurements L x W x D (cm) 1.885 0.118 N/A Post Debridement Volume: (cm) Scarring: Yes Scarring: Yes N/A Periwound Skin Texture: No Abnormalities Noted No Abnormalities Noted N/A Periwound Skin Moisture: Hemosiderin Staining: Yes Hemosiderin Staining: Yes N/A Periwound Skin Color: No Abnormality No Abnormality N/A Temperature: Debridement Debridement N/A Procedures Performed: Treatment Notes Electronic Signature(s) Signed: 12/09/2022 12:00:51 PM By: [name] MD FACS Entered By: [name] on 12/09/2022  12:00:51 -------------------------------------------------------------------------------- Multi-Disciplinary Care Plan Details Patient Name: Date of Service: [place] RE, [name] 12/09/2022 11:00 A M Medical Record Number: [id number] Patient Account Number: [id number] Date of Birth/Sex: Treating RN: April 28, 1956 (67 y.o. [name] Primary Care Davonne Baby: Kathyrn Drown Other Clinician: Referring Abigail Marsiglia: Treating Kaleo Condrey/Extender: Alean Rinne in Treatment: [place] reviewed with physician Active Inactive Venous Leg Ulcer GRIMM, BASSANI ([id number]) [phone].pdf Page 4 of 8 Nursing Diagnoses: Knowledge deficit related to disease process and management Potential for venous Insuffiency (use before diagnosis confirmed) Goals: Patient will maintain optimal edema control Date Initiated: 11/23/2022 Target Resolution Date: 03/17/2023 Goal Status: Active Interventions: Assess peripheral edema status every visit. Compression as ordered Treatment Activities: Therapeutic compression applied : 11/23/2022 Notes: Wound/Skin Impairment Nursing Diagnoses: Impaired tissue integrity Goals: Patient/caregiver will verbalize understanding of skin care regimen Date Initiated: 11/23/2022 Target Resolution Date: 03/17/2023 Goal Status: Active Ulcer/skin breakdown will have a volume reduction of 30% by week 4 Date Initiated: 11/07/2022 Target Resolution Date: 03/17/2023 Goal Status: Active Interventions: Assess patient/caregiver ability to obtain necessary supplies  Assess patient/caregiver ability to perform ulcer/skin care regimen upon admission and as needed Assess ulceration(s) every visit Treatment Activities: Skin care regimen initiated : 11/07/2022 Topical wound management initiated : 11/07/2022 Notes: Electronic Signature(s) Signed: 12/09/2022 12:21:52 PM By: Dellie Catholic RN Entered By: Dellie Catholic on 12/09/2022  12:19:12 -------------------------------------------------------------------------------- Pain Assessment Details Patient Name: Date of Service: [name]. 12/09/2022 11:00 A M Medical Record Number: [id number] Patient Account Number: [id number] Date of Birth/Sex: Treating RN: 11-01-1955 (67 y.o. M) Primary Care Ahnna Dungan: Kathyrn Drown Other Clinician: Referring Deya Bigos: Treating Jet Armbrust/Extender: Alean Rinne in Treatment: 4 Active Problems Location of Pain Severity and Description of Pain Patient Has Paino No Site Locations [place] D ([id number]) [id number] Page 5 of 8 Pain Management and Medication Current Pain Management: Electronic Signature(s) Signed: 12/09/2022 2:18:46 PM By: [name] Entered By: [name] on 12/09/2022 11:17:42 -------------------------------------------------------------------------------- Patient/Caregiver Education Details Patient Name: Date of Service: [name], [name] 2/23/2024andnbsp11:00 [place] Record Number: [id number] Patient Account Number: [id number] Date of Birth/Gender: Treating RN: 1956/07/26 (67 y.o. [name] Primary Care Physician: Kathyrn Drown Other Clinician: Referring Physician: Treating Physician/Extender: Alean Rinne in Treatment: 4 Education Assessment Education Provided To: Patient Education Topics Provided Wound/Skin Impairment: Methods: Explain/Verbal Responses: Return demonstration correctly Electronic Signature(s) Signed: 12/09/2022 12:21:52 PM By: Dellie Catholic RN Entered By: Dellie Catholic on 12/09/2022 12:19:26 -------------------------------------------------------------------------------- Wound Assessment Details Patient Name: Date of Service: [name] 12/09/2022 11:00 A M Medical Record Number: [id number] Patient Account Number: [id number] Date of Birth/Sex: Treating RN: [birth year] (67  y.o. M) Primary Care Keyara Ent: Kathyrn Drown Other Clinician: Referring Zannie Runkle: Treating Allayah Raineri/Extender: Kristine Royal, Scott A Weeks in Treatment: 4 Wound Status Wound Number: 1 Primary Diabetic Wound/Ulcer of the Lower Extremity Etiology: Wound Location: Right, Lateral Lower Leg Wound Open Wounding Event: Shear/Friction Status: Date Acquired: 09/16/2022 Comorbid Congestive Heart Failure, Hypertension, Peripheral Venous Weeks Of Treatment: 4 History: Disease, Type II Diabetes, Osteoarthritis, Neuropathy Clustered Wound: No HENRIE, DAVTYAN ([id number]) [phone].pdf Page 6 of 8 Photos Wound Measurements Length: (cm) 4.8 Width: (cm) 1 Depth: (cm) 0.5 Area: (cm) 3.77 Volume: (cm) 1.885 % Reduction in Area: 11.1% % Reduction in Volume: -48.2% Epithelialization: Small (1-33%) Tunneling: No Undermining: No Wound Description Classification: Grade 1 Exudate Amount: Medium Exudate Type: Serosanguineous Exudate Color: red, brown Wound Bed Granulation Amount: Large (67-100%) Exposed Structure Granulation Quality: Red Fascia Exposed: No Necrotic Amount: Small (1-33%) Fat Layer (Subcutaneous Tissue) Exposed: No Necrotic Quality: Eschar, Adherent Slough Tendon Exposed: No Muscle Exposed: No Joint Exposed: No Bone Exposed: No Periwound Skin Texture Texture Color No Abnormalities Noted: No No Abnormalities Noted: No Scarring: Yes Hemosiderin Staining: Yes Moisture Temperature / Pain No Abnormalities Noted: Yes Temperature: No Abnormality Treatment Notes Wound #1 (Lower Leg) Wound Laterality: Right, Lateral Cleanser Soap and Water Discharge Instruction: May shower and wash wound with dial antibacterial soap and water prior to dressing change. Wound Cleanser Discharge Instruction: Cleanse the wound with wound cleanser prior to applying a clean dressing using gauze sponges, not tissue or cotton balls. Peri-Wound Care Sween Lotion  (Moisturizing lotion) Discharge Instruction: Apply moisturizing lotion as directed Topical Primary Dressing Sorbalgon AG Dressing 2x2 (in/in) Discharge Instruction: Apply to wound bed as instructed Secondary Dressing Zetuvit Plus 4x8 in Discharge Instruction: Apply over primary dressing as directed. Secured With Transpore Surgical Tape, 2x10 (in/yd) Discharge Instruction: Secure dressing with tape as directed. Compression CALLIN, GRISE ([id number])  [id number] Page 7 of 8 ThreePress (3 layer compression wrap) Discharge Instruction: Apply three layer compression as directed. Compression Stockings Add-Ons Electronic Signature(s) Signed: 12/09/2022 12:21:52 PM By: Dellie Catholic RN Entered By: Dellie Catholic on 12/09/2022 11:34:57 -------------------------------------------------------------------------------- Wound Assessment Details Patient Name: Date of Service: [name] 12/09/2022 11:00 A M Medical Record Number: [id number] Patient Account Number: [id number] Date of Birth/Sex: Treating RN: 1956-08-14 (67 y.o. M) Primary Care Laurie Penado: Kathyrn Drown Other Clinician: Referring Dondrea Clendenin: Treating Yassmine Tamm/Extender: Kristine Royal, Scott A Weeks in Treatment: 4 Wound Status Wound Number: 2 Primary Diabetic Wound/Ulcer of the Lower Extremity Etiology: Wound Location: Right, Lateral Ankle Wound Open Wounding Event: Shear/Friction Status: Date Acquired: 09/16/2022 Comorbid Congestive Heart Failure, Hypertension, Peripheral Venous Weeks Of Treatment: 4 History: Disease, Type II Diabetes, Osteoarthritis, Neuropathy Clustered Wound: No Photos Wound Measurements Length: (cm) 1.5 Width: (cm) 0.5 Depth: (cm) 0.2 Area: (cm) 0.589 Volume: (cm) 0.118 % Reduction in Area: 16.7% % Reduction in Volume: 16.3% Epithelialization: Small (1-33%) Tunneling: No Undermining: No Wound Description Classification: Exudate  Amount: Exudate Type: Exudate Color: Grade 1 Medium Serous amber Wound Bed Granulation Amount: Large (67-100%) Exposed Structure Granulation Quality: Red Fascia Exposed: No Necrotic Amount: Small (1-33%) Fat Layer (Subcutaneous Tissue) Exposed: Yes Necrotic Quality: Eschar, Adherent Slough Tendon Exposed: No Muscle Exposed: No Joint Exposed: No Bone Exposed: No Periwound Skin Texture Texture Color No Abnormalities Noted: No No Abnormalities Noted: No Scarring: Yes Hemosiderin Staining: Yes Moisture Temperature / Pain No Abnormalities Noted: Yes Temperature: No Abnormality LEEANTHONY, MCBANE ([id number]) [phone].pdf Page 8 of 8 Treatment Notes Wound #2 (Ankle) Wound Laterality: Right, Lateral Cleanser Soap and Water Discharge Instruction: May shower and wash wound with dial antibacterial soap and water prior to dressing change. Wound Cleanser Discharge Instruction: Cleanse the wound with wound cleanser prior to applying a clean dressing using gauze sponges, not tissue or cotton balls. Peri-Wound Care Sween Lotion (Moisturizing lotion) Discharge Instruction: Apply moisturizing lotion as directed Topical Primary Dressing Sorbalgon AG Dressing 2x2 (in/in) Discharge Instruction: Apply to wound bed as instructed Secondary Dressing Zetuvit Plus 4x8 in Discharge Instruction: Apply over primary dressing as directed. Secured With Transpore Surgical Tape, 2x10 (in/yd) Discharge Instruction: Secure dressing with tape as directed. Compression Wrap ThreePress (3 layer compression wrap) Discharge Instruction: Apply three layer compression as directed. Compression Stockings Add-Ons Electronic Signature(s) Signed: 12/09/2022 12:21:52 PM By: Dellie Catholic RN Entered By: Dellie Catholic on 12/09/2022 11:35:26 -------------------------------------------------------------------------------- Vitals Details Patient Name: Date of Service: [name], [name]  12/09/2022 11:00 A M Medical Record Number: [id number] Patient Account Number: [id number] Date of Birth/Sex: Treating RN: [birth year] (67 y.o. M) Primary Care Jovanne Riggenbach: Kathyrn Drown Other Clinician: Referring Ikey Omary: Treating Amahia Madonia/Extender: Kristine Royal, Scott A Weeks in Treatment: 4 Vital Signs Time Taken: 11:17 Temperature (F): 98.7 Height (in): 74 Pulse (bpm): 90 Weight (lbs): 252 Respiratory Rate (breaths/min): 20 Body Mass Index (BMI): 32.4 Blood Pressure (mmHg): 136/71 Capillary Blood Glucose (mg/dl): 117 Reference Range: 80 - 120 mg / dl Electronic Signature(s) Signed: 12/09/2022 2:18:46 PM By: [name] Entered By: [name] on 12/09/2022 11:17:31

## 2022-12-10 NOTE — Progress Notes (Signed)
LEXIN, CERDA ([id number]) [id number] Page 1 of 11 Visit Report for 12/09/2022 Chief Complaint Document Details Patient Name: Date of Service: [name] 12/09/2022 11:00 A M Medical Record Number: [id number] Patient Account Number: [id number] Date of Birth/Sex: Treating RN: 1956/05/19 (67 y.o. M) Primary Care Provider: Kathyrn Drown Other Clinician: Referring Provider: Treating Provider/Extender: Alean Rinne in Treatment: 4 Information Obtained from: Patient Chief Complaint Patients presents for treatment of an open diabetic ulcer on his right foot, as well as ulcers on his right lower leg and ankle Electronic Signature(s) Signed: 12/09/2022 12:00:59 PM By: [name] MD FACS Entered By: [name] on 12/09/2022 12:00:59 -------------------------------------------------------------------------------- Debridement Details Patient Name: Date of Service: [name], [name] 12/09/2022 11:00 A M Medical Record Number: [id number] Patient Account Number: [id number] Date of Birth/Sex: Treating RN: Mar 02, 1956 (67 y.o. [name] Primary Care Provider: Kathyrn Drown Other Clinician: Referring Provider: Treating Provider/Extender: Alean Rinne in Treatment: 4 Debridement Performed for Assessment: Wound #1 Right,Lateral Lower Leg Performed By: Physician [name], MD Debridement Type: Debridement Severity of Tissue Pre Debridement: Fat layer exposed Level of Consciousness (Pre-procedure): Awake and Alert Pre-procedure Verification/Time Out Yes - 11:38 Taken: Start Time: 11:38 Pain Control: Lidocaine 4% T opical Solution T Area Debrided (L x W): otal 4.8 (cm) x 1 (cm) = 4.8 (cm) Tissue and other material debrided: Non-Viable, Slough, Subcutaneous, Slough Level: Skin/Subcutaneous Tissue Debridement Description: Excisional Instrument: Curette Bleeding: Minimum Hemostasis  Achieved: Pressure End Time: 11:40 Procedural Pain: 0 Post Procedural Pain: 0 Response to Treatment: Procedure was tolerated well Level of Consciousness (Post- Awake and Alert procedure): Post Debridement Measurements of Total Wound Length: (cm) 4.8 Width: (cm) 1 Depth: (cm) 0.5 Volume: (cm) 1.885 Character of Wound/Ulcer Post Debridement: Improved Severity of Tissue Post Debridement: Fat layer exposed [name] ([id number]) [id number] Page 2 of 11 Post Procedure Diagnosis Same as Pre-procedure Notes Scribed for Dr. Celine Ahr by J.Scotton Electronic Signature(s) Signed: 12/09/2022 12:21:52 PM By: Dellie Catholic RN Signed: 12/09/2022 12:30:01 PM By: [name] MD FACS Entered By: Dellie Catholic on 12/09/2022 11:43:42 -------------------------------------------------------------------------------- Debridement Details Patient Name: Date of Service: [name], [name] 12/09/2022 11:00 A M Medical Record Number: [id number] Patient Account Number: [id number] Date of Birth/Sex: Treating RN: [birth year] (67 y.o. [name] Primary Care Provider: Kathyrn Drown Other Clinician: Referring Provider: Treating Provider/Extender: Alean Rinne in Treatment: 4 Debridement Performed for Assessment: Wound #2 Right,Lateral Ankle Performed By: Physician [name], MD Debridement Type: Debridement Severity of Tissue Pre Debridement: Fat layer exposed Level of Consciousness (Pre-procedure): Awake and Alert Pre-procedure Verification/Time Out Yes - 11:38 Taken: Start Time: 11:38 Pain Control: Lidocaine 4% T opical Solution T Area Debrided (L x W): otal 1.5 (cm) x 0.5 (cm) = 0.75 (cm) Tissue and other material debrided: Non-Viable, Slough, Subcutaneous, Slough Level: Skin/Subcutaneous Tissue Debridement Description: Excisional Instrument: Curette Bleeding: Minimum Hemostasis Achieved: Pressure End Time:  11:40 Procedural Pain: 0 Post Procedural Pain: 0 Response to Treatment: Procedure was tolerated well Level of Consciousness (Post- Awake and Alert procedure): Post Debridement Measurements of Total Wound Length: (cm) 1.5 Width: (cm) 0.5 Depth: (cm) 0.2 Volume: (cm) 0.118 Character of Wound/Ulcer Post Debridement: Improved Severity of Tissue Post Debridement: Fat layer exposed Post Procedure Diagnosis Same as Pre-procedure Notes Scribed for Dr. Celine Ahr by J.S. Electronic Signature(s) Signed: 12/09/2022 12:21:52 PM By: Dellie Catholic RN Signed: 12/09/2022 12:30:01 PM By: [name] MD FACS  Entered By: Dellie Catholic on 12/09/2022 11:44:55 [name] ([id number]) [id number].pdf Page 3 of 11 -------------------------------------------------------------------------------- HPI Details Patient Name: Date of Service: [name], [name] 12/09/2022 11:00 A M Medical Record Number: [id number] Patient Account Number: [id number] Date of Birth/Sex: Treating RN: 1956/05/11 (67 y.o. 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 He has a remote history of surgery on his right ankle and apparently as his legs have become increasingly more swollen and he has insisted upon wearing boots, the pressure and friction seem to have opened up wounds along the old scar line. In addition, he has a large callus on the third metatarsal head, related to his acquired foot deformity. He sees a  podiatrist who periodically shaves this callus down and he was also recommended orthotics, but the patient has refused them because he likes his boots. There is an area of discoloration in the lower portion of this callus, suspicious for an open wound. 11/14/2022: The ulcer on his third metatarsal head has closed. The lateral leg ulcers are both about the same size, but he says that they feel better. There is slough accumulation in both sites. 11/23/2022: The ulcer on his foot remains closed. The lateral leg ulcers are still unchanged in size, but are a bit cleaner, just with some slough accumulation. 12/01/2022: No significant change to the size of the wounds on his lateral leg. The larger is quite clean with good granulation tissue present. The smaller still has fairly thick slough buildup. 12/09/2022: Both wounds are smaller today. They still have slough accumulation, but less so than at his previous visit. The callus on his foot has built up again and is uncomfortable. Electronic Signature(s) Signed: 12/09/2022 12:01:45 PM By: [name] MD FACS Entered By: [name] on 12/09/2022 12:01:45 -------------------------------------------------------------------------------- Paring/cutting 1 benign hyperkeratotic lesion Details Patient Name: Date of Service: [name] 12/09/2022 11:00 A M Medical Record Number: [id number] Patient Account Number: [id number] Date of Birth/Sex: Treating RN: [birth year] (67 y.o. [name] Primary Care Provider: Kathyrn Drown Other Clinician: Referring Provider: Treating Provider/Extender: Alean Rinne in Treatment: 4 Procedure Performed for: Non-Wound Location Performed By: Physician [name], MD Post Procedure Diagnosis Same as Pre-procedure Notes 2nd Metatarsal Head Right Foot Electronic Signature(s) Signed: 12/09/2022 12:21:52 PM By: Dellie Catholic RN Signed: 12/09/2022 12:30:01 PM By: [name] MD FACS Entered By: Dellie Catholic on 12/09/2022 11:45:36 [name] ([id number]) [phone].pdf Page 4 of 11 -------------------------------------------------------------------------------- Physical Exam Details Patient Name: Date of Service: [name] [name] 12/09/2022 11:00 A M Medical Record Number: [id number] Patient Account Number: [id number] Date of Birth/Sex: Treating RN: Sep 26, 1956 (67 y.o. M) Primary Care Provider: Kathyrn Drown Other Clinician: Referring Provider: Treating Provider/Extender: Kristine Royal, Scott A Weeks in Treatment: 4 Constitutional . . . . no acute distress. Respiratory Normal work of breathing on room air. Notes 12/09/2022: Both wounds are smaller today. They still have slough accumulation, but less so than at his previous visit. The callus on his foot has built up again and is uncomfortable. Electronic Signature(s) Signed: 12/09/2022  12:02:21 PM By: [name] MD FACS Entered By: [name] on 12/09/2022 12:02:21 -------------------------------------------------------------------------------- Physician Orders Details Patient Name: Date of Service: [name], [name] 12/09/2022 11:00 A M Medical Record Number: [id number] Patient Account Number: [id number] Date of Birth/Sex: Treating RN: [birth year] (67 y.o. 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 - Dr. Celine Ahr Rm 2 Return A Anesthetic Wound #1 Right,Lateral Lower Leg (In clinic) Topical Lidocaine 4% applied to wound bed Wound #2 Right,Lateral Ankle (In clinic) Topical Lidocaine 4% applied to wound bed [name] ([id number]) [phone].pdf Page 5 of 11 Bathing/ Shower/ Hygiene May shower with protection but do not get wound dressing(s) wet. Protect dressing(s) with water repellant cover (for example, large plastic bag) or a cast cover and may then take shower. - May purchase cast protector at [facility], [facility], or [facility] Negative Presssure Wound Therapy Wound #1 Right,Lateral Lower Leg Wound Vac to wound continuously at 122m/hg pressure - RUN IVR WOUND VAC for Right Lateral leg Black Foam Edema Control - Lymphedema / SCD / Other Right Lower Extremity Elevate legs to the level of the heart or above for 30 minutes daily and/or when sitting for 3-4 times a day throughout the day. Avoid standing for long periods of time. Wound Treatment Wound #1 - Lower Leg Wound Laterality: Right, Lateral Cleanser: Soap and Water 1 x Per Week/30 Days Discharge Instructions: May shower and wash wound with dial antibacterial soap and water prior to dressing change. Cleanser: Wound Cleanser 1 x Per Week/30 Days Discharge Instructions: Cleanse the wound with wound cleanser prior to applying a clean dressing using gauze sponges, not tissue or cotton balls. Peri-Wound Care: Sween Lotion (Moisturizing lotion) 1 x Per Week/30 Days Discharge Instructions: Apply moisturizing lotion as directed Prim Dressing: Sorbalgon AG Dressing 2x2 (in/in) 1 x Per Week/30 Days ary Discharge Instructions: Apply to wound bed as instructed Secondary Dressing: Zetuvit Plus 4x8  in 1 x Per Week/30 Days Discharge Instructions: Apply over primary dressing as directed. Secured With: Transpore Surgical Tape, 2x10 (in/yd) 1 x Per Week/30 Days Discharge Instructions: Secure dressing with tape as directed. Compression Wrap: ThreePress (3 layer compression wrap) 1 x Per Week/30 Days Discharge Instructions: Apply three layer compression as directed. Wound #2 - Ankle Wound Laterality: Right, Lateral Cleanser: Soap and Water 1 x Per Week/30 Days Discharge Instructions: May shower and wash wound with dial antibacterial soap and water prior to dressing change. Cleanser: Wound Cleanser 1 x Per Week/30 Days Discharge Instructions: Cleanse the wound with wound cleanser prior to applying a clean dressing using gauze sponges, not tissue or cotton balls. Peri-Wound Care: Sween Lotion (Moisturizing lotion) 1 x Per Week/30 Days Discharge Instructions: Apply moisturizing lotion as directed Prim Dressing: Sorbalgon AG Dressing 2x2 (in/in) 1 x Per Week/30 Days ary Discharge Instructions: Apply to wound bed as instructed Secondary Dressing: Zetuvit Plus 4x8 in 1 x Per Week/30 Days Discharge Instructions:  Apply over primary dressing as directed. Secured With: Transpore Surgical Tape, 2x10 (in/yd) 1 x Per Week/30 Days Discharge Instructions: Secure dressing with tape as directed. Compression Wrap: ThreePress (3 layer compression wrap) 1 x Per Week/30 Days Discharge Instructions: Apply three layer compression as directed. Electronic Signature(s) Signed: 12/09/2022 12:21:52 PM By: Dellie Catholic RN Signed: 12/09/2022 12:30:01 PM By: [name] MD FACS Entered By: Dellie Catholic on 12/09/2022 12:18:21 Problem List Details -------------------------------------------------------------------------------- [name] ([id number]) [id number] Page 6 of 11 Patient Name: Date of Service: [name], [name] 12/09/2022 11:00 A M Medical Record Number:  [id number] Patient Account Number: [id number] Date of Birth/Sex: Treating RN: 1956/10/15 (67 y.o. M) Primary Care Provider: Kathyrn Drown Other Clinician: Referring Provider: Treating Provider/Extender: Alean Rinne in Treatment: 4 Active Problems ICD-10 Encounter Code Description Active Date MDM Diagnosis L97.819 Non-pressure chronic ulcer of other part of right lower leg with unspecified 11/07/2022 No Yes severity L97.312 Non-pressure chronic ulcer of right ankle with fat layer exposed 11/07/2022 No Yes E11.622 Type 2 diabetes mellitus with other skin ulcer 11/07/2022 No Yes [id number] Chronic systolic (congestive) heart failure 11/07/2022 No Yes I73.9 Peripheral vascular disease, unspecified 11/07/2022 No Yes E11.42 Type 2 diabetes mellitus with diabetic polyneuropathy 11/07/2022 No Yes I63.9 Cerebral infarction, unspecified 11/07/2022 No Yes I10 Essential (primary) hypertension 11/07/2022 No Yes G89.4 Chronic pain syndrome 11/07/2022 No Yes L84 Corns and callosities 12/09/2022 No Yes Inactive Problems ICD-10 Code Description Active Date Inactive Date L97.512 Non-pressure chronic ulcer of other part of right foot with fat layer exposed 11/07/2022 11/07/2022 [id number] Type 2 diabetes mellitus with foot ulcer 11/07/2022 11/07/2022 Resolved Problems Electronic Signature(s) Signed: 12/09/2022 12:06:40 PM By: [name] MD FACS Previous Signature: 12/09/2022 12:00:45 PM Version By: [name] MD FACS Entered By: [name] on 12/09/2022 12:06:40 [name] ([id number]) [phone].pdf Page 7 of 11 -------------------------------------------------------------------------------- Progress Note Details Patient Name: Date of Service: [name], [name] 12/09/2022 11:00 A M Medical Record Number: [id number] Patient Account Number: [id number] Date of Birth/Sex: Treating RN: [birth year] (67 y.o. M) Primary Care Provider: Kathyrn Drown  Other Clinician: Referring Provider: Treating Provider/Extender: Alean Rinne in Treatment: 4 Subjective Chief Complaint Information obtained from Patient Patients presents for treatment of an open diabetic ulcer on his right foot, as well as ulcers on his right lower leg and ankle History of Present Illness (HPI) ADMISSION 11/07/2022 This is a 67 year old man with a past medical history significant for type 2 diabetes (last hemoglobin A1c 7.1), cerebrovascular occlusive disease, coronary artery disease, peripheral vascular disease, congestive heart failure, chronic pain syndrome, and prior amputation of his right great and second toes. He has a remote history of surgery on his right ankle and apparently as his legs have become increasingly more swollen and he has insisted upon wearing boots, the pressure and friction seem to have opened up wounds along the old scar line. In addition, he has a large callus on the third metatarsal head, related to his acquired foot deformity. He sees a podiatrist who periodically shaves this callus down and he was also recommended orthotics, but the patient has refused them because he likes his boots. There is an area of discoloration in the lower portion of this callus, suspicious for an open wound. 11/14/2022: The ulcer on his third metatarsal head has closed. The lateral leg ulcers are both about the same size, but he says that they feel better. There is slough accumulation in both  sites. 11/23/2022: The ulcer on his foot remains closed. The lateral leg ulcers are still unchanged in size, but are a bit cleaner, just with some slough accumulation. 12/01/2022: No significant change to the size of the wounds on his lateral leg. The larger is quite clean with good granulation tissue present. The smaller still has fairly thick slough buildup. 12/09/2022: Both wounds are smaller today. They still have slough accumulation, but less so than at  his previous visit. The callus on his foot has built up again and is uncomfortable. Patient History Family History Cancer - Father,Mother,Siblings, Diabetes - Mother,Siblings, Heart Disease, Lung Disease - Father, No family history of Hereditary Spherocytosis, Hypertension, Kidney Disease, Seizures, Stroke, Thyroid Problems, Tuberculosis. Social History Former smoker - ended on 12/23/1970, Marital Status - Married, Alcohol Use - Never, Drug Use - No History, Caffeine Use - Daily. Medical History Eyes Denies history of Cataracts, Glaucoma, Optic Neuritis Ear/Nose/Mouth/Throat Denies history of Chronic sinus problems/congestion, Middle ear problems Respiratory Denies history of Aspiration, Asthma, Chronic Obstructive Pulmonary Disease (COPD), Pneumothorax, Sleep Apnea, Tuberculosis Cardiovascular Patient has history of Congestive Heart Failure, Hypertension, Peripheral Venous Disease Endocrine Patient has history of Type II Diabetes Musculoskeletal Patient has history of Osteoarthritis Neurologic Patient has history of Neuropathy Oncologic Denies history of Received Chemotherapy, Received Radiation Psychiatric Denies history of Anorexia/bulimia, Confinement Anxiety Medical A Surgical History Notes nd Cardiovascular cardiomyopathy hyperlipidemia hypokalemia stroke Genitourinary orchitis Objective PERLE, PAXSON ([id number]) [id number].pdf Page 8 of 11 Constitutional no acute distress. Vitals Time Taken: 11:17 AM, Height: 74 in, Weight: 252 lbs, BMI: 32.4, Temperature: 98.7 F, Pulse: 90 bpm, Respiratory Rate: 20 breaths/min, Blood Pressure: 136/71 mmHg, Capillary Blood Glucose: 117 mg/dl. Respiratory Normal work of breathing on room air. General Notes: 12/09/2022: Both wounds are smaller today. They still have slough accumulation, but less so than at his previous visit. The callus on his foot has built up again and is uncomfortable. Integumentary (Hair,  Skin) Wound #1 status is Open. Original cause of wound was Shear/Friction. The date acquired was: 09/16/2022. The wound has been in treatment 4 weeks. The wound is located on the Right,Lateral Lower Leg. The wound measures 4.8cm length x 1cm width x 0.5cm depth; 3.77cm^2 area and 1.885cm^3 volume. There is no tunneling or undermining noted. There is a medium amount of serosanguineous drainage noted. There is large (67-100%) red granulation within the wound bed. There is a small (1-33%) amount of necrotic tissue within the wound bed including Eschar and Adherent Slough. The periwound skin appearance had no abnormalities noted for moisture. The periwound skin appearance exhibited: Scarring, Hemosiderin Staining. Periwound temperature was noted as No Abnormality. Wound #2 status is Open. Original cause of wound was Shear/Friction. The date acquired was: 09/16/2022. The wound has been in treatment 4 weeks. The wound is located on the Right,Lateral Ankle. The wound measures 1.5cm length x 0.5cm width x 0.2cm depth; 0.589cm^2 area and 0.118cm^3 volume. There is Fat Layer (Subcutaneous Tissue) exposed. There is no tunneling or undermining noted. There is a medium amount of serous drainage noted. There is large (67- 100%) red granulation within the wound bed. There is a small (1-33%) amount of necrotic tissue within the wound bed including Eschar and Adherent Slough. The periwound skin appearance had no abnormalities noted for moisture. The periwound skin appearance exhibited: Scarring, Hemosiderin Staining. Periwound temperature was noted as No Abnormality. Assessment Active Problems ICD-10 Non-pressure chronic ulcer of other part of right lower leg with unspecified severity Non-pressure chronic ulcer of right ankle with  fat layer exposed Type 2 diabetes mellitus with other skin ulcer Chronic systolic (congestive) heart failure Peripheral vascular disease, unspecified Type 2 diabetes mellitus with  diabetic polyneuropathy Cerebral infarction, unspecified Essential (primary) hypertension Chronic pain syndrome Corns and callosities Procedures Wound #1 Pre-procedure diagnosis of Wound #1 is a Diabetic Wound/Ulcer of the Lower Extremity located on the Right,Lateral Lower Leg .Severity of Tissue Pre Debridement is: Fat layer exposed. There was a Excisional Skin/Subcutaneous Tissue Debridement with a total area of 4.8 sq cm performed by [name], MD. With the following instrument(s): Curette to remove Non-Viable tissue/material. Material removed includes Subcutaneous Tissue and Slough and after achieving pain control using Lidocaine 4% Topical Solution. No specimens were taken. A time out was conducted at 11:38, prior to the start of the procedure. A Minimum amount of bleeding was controlled with Pressure. The procedure was tolerated well with a pain level of 0 throughout and a pain level of 0 following the procedure. Post Debridement Measurements: 4.8cm length x 1cm width x 0.5cm depth; 1.885cm^3 volume. Character of Wound/Ulcer Post Debridement is improved. Severity of Tissue Post Debridement is: Fat layer exposed. Post procedure Diagnosis Wound #1: Same as Pre-Procedure General Notes: Scribed for Dr. Celine Ahr by J.Scotton. Wound #2 Pre-procedure diagnosis of Wound #2 is a Diabetic Wound/Ulcer of the Lower Extremity located on the Right,Lateral Ankle .Severity of Tissue Pre Debridement is: Fat layer exposed. There was a Excisional Skin/Subcutaneous Tissue Debridement with a total area of 0.75 sq cm performed by [name], MD. With the following instrument(s): Curette to remove Non-Viable tissue/material. Material removed includes Subcutaneous Tissue and Slough and after achieving pain control using Lidocaine 4% Topical Solution. No specimens were taken. A time out was conducted at 11:38, prior to the start of the procedure. A Minimum amount of bleeding was controlled with  Pressure. The procedure was tolerated well with a pain level of 0 throughout and a pain level of 0 following the procedure. Post Debridement Measurements: 1.5cm length x 0.5cm width x 0.2cm depth; 0.118cm^3 volume. Character of Wound/Ulcer Post Debridement is improved. Severity of Tissue Post Debridement is: Fat layer exposed. Post procedure Diagnosis Wound #2: Same as Pre-Procedure General Notes: Scribed for Dr. Celine Ahr by Lenna Sciara.S.. A Paring/cutting 1 benign hyperkeratotic lesion procedure was performed. by [name], MD. Post procedure Diagnosis Wound #: Same as Pre-Procedure Notes: 2nd Metatarsal Head Right Foot Plan ADISA, ORILEY ([id number]) [phone].pdf Page 9 of 11 Follow-up Appointments: Return Appointment in 1 week. - Dr. Celine Ahr Rm 2 Anesthetic: Wound #1 Right,Lateral Lower Leg: (In clinic) Topical Lidocaine 4% applied to wound bed Wound #2 Right,Lateral Ankle: (In clinic) Topical Lidocaine 4% applied to wound bed Bathing/ Shower/ Hygiene: May shower with protection but do not get wound dressing(s) wet. Protect dressing(s) with water repellant cover (for example, large plastic bag) or a cast cover and may then take shower. - May purchase cast protector at [facility], [facility], or [facility] Edema Control - Lymphedema / SCD / Other: Elevate legs to the level of the heart or above for 30 minutes daily and/or when sitting for 3-4 times a day throughout the day. Avoid standing for long periods of time. WOUND #1: - Lower Leg Wound Laterality: Right, Lateral Cleanser: Soap and Water 1 x Per Week/ Discharge Instructions: May shower and wash wound with dial antibacterial soap and water prior to dressing change. Cleanser: Wound Cleanser 1 x Per Week/ Discharge Instructions: Cleanse the wound with wound cleanser prior to applying a clean dressing using gauze sponges, not tissue  or cotton balls. Peri-Wound Care: Sween Lotion (Moisturizing lotion) 1 x Per  Week/ Discharge Instructions: Apply moisturizing lotion as directed Prim Dressing: Sorbalgon AG Dressing 2x2 (in/in) 1 x Per Week/ ary Discharge Instructions: Apply to wound bed as instructed Secondary Dressing: Zetuvit Plus 4x8 in 1 x Per Week/ Discharge Instructions: Apply over primary dressing as directed. Secured With: Transpore Surgical T ape, 2x10 (in/yd) 1 x Per Week/ Discharge Instructions: Secure dressing with tape as directed. Com pression Wrap: ThreePress (3 layer compression wrap) 1 x Per Week/ Discharge Instructions: Apply three layer compression as directed. WOUND #2: - Ankle Wound Laterality: Right, Lateral Cleanser: Soap and Water 1 x Per Week/ Discharge Instructions: May shower and wash wound with dial antibacterial soap and water prior to dressing change. Cleanser: Wound Cleanser 1 x Per Week/ Discharge Instructions: Cleanse the wound with wound cleanser prior to applying a clean dressing using gauze sponges, not tissue or cotton balls. Peri-Wound Care: Sween Lotion (Moisturizing lotion) 1 x Per Week/ Discharge Instructions: Apply moisturizing lotion as directed Prim Dressing: Sorbalgon AG Dressing 2x2 (in/in) 1 x Per Week/ ary Discharge Instructions: Apply to wound bed as instructed Secondary Dressing: Zetuvit Plus 4x8 in 1 x Per Week/ Discharge Instructions: Apply over primary dressing as directed. Secured With: Transpore Surgical T ape, 2x10 (in/yd) 1 x Per Week/ Discharge Instructions: Secure dressing with tape as directed. Com pression Wrap: ThreePress (3 layer compression wrap) 1 x Per Week/ Discharge Instructions: Apply three layer compression as directed. 12/09/2022: Both wounds are smaller today. They still have slough accumulation, but less so than at his previous visit. The callus on his foot has built up again and is uncomfortable. I used a curette to debride slough and nonviable subcutaneous tissue from both of his leg/ankle wounds. I then pared back the  callus on his foot, also with a curette. I think the larger of the 2 wounds would benefit from a wound VAC and at this point, I think the wound is clean enough that we can proceed with checking to see if his insurance will cover it. In the meantime, we will continue silver alginate and 3 layer compression. Follow-up in 1 week. Electronic Signature(s) Signed: 12/09/2022 12:07:11 PM By: [name] MD FACS Previous Signature: 12/09/2022 12:05:14 PM Version By: [name] MD FACS Entered By: [name] on 12/09/2022 12:07:11 -------------------------------------------------------------------------------- HxROS Details Patient Name: Date of Service: [name], [name] 12/09/2022 11:00 A M Medical Record Number: [id number] Patient Account Number: [id number] Date of Birth/Sex: Treating RN: [birth year] (67 y.o. M) Primary Care Provider: Kathyrn Drown Other Clinician: Referring Provider: Treating Provider/Extender: Alean Rinne in Treatment: 4 Eyes Medical History: Negative for: Cataracts; Glaucoma; Optic Neuritis [name], [name] ([id number]) [phone].pdf Page 10 of 11 Ear/Nose/Mouth/Throat Medical History: Negative for: Chronic sinus problems/congestion; Middle ear problems Respiratory Medical History: Negative for: Aspiration; Asthma; Chronic Obstructive Pulmonary Disease (COPD); Pneumothorax; Sleep Apnea; Tuberculosis Cardiovascular Medical History: Positive for: Congestive Heart Failure; Hypertension; Peripheral Venous Disease Past Medical History Notes: cardiomyopathy hyperlipidemia hypokalemia stroke Endocrine Medical History: Positive for: Type II Diabetes Time with diabetes: 20 years Treated with: Insulin, Oral agents Genitourinary Medical History: Past Medical History Notes: orchitis Musculoskeletal Medical History: Positive for: Osteoarthritis Neurologic Medical History: Positive for:  Neuropathy Oncologic Medical History: Negative for: Received Chemotherapy; Received Radiation Psychiatric Medical History: Negative for: Anorexia/bulimia; Confinement Anxiety Immunizations Pneumococcal Vaccine: Received Pneumococcal Vaccination: Yes Received Pneumococcal Vaccination On or After 60th Birthday: Yes Implantable Devices No devices added Family  and Social History Cancer: Yes - Father,Mother,Siblings; Diabetes: Yes - Mother,Siblings; Heart Disease: Yes; Hereditary Spherocytosis: No; Hypertension: No; Kidney Disease: No; Lung Disease: Yes - Father; Seizures: No; Stroke: No; Thyroid Problems: No; Tuberculosis: No; Former smoker - ended on 12/23/1970; Marital Status - Married; Alcohol Use: Never; Drug Use: No History; Caffeine Use: Daily; Financial Concerns: No; Food, Clothing or Shelter Needs: No; Support System Lacking: No; Transportation Concerns: No Electronic Signature(s) Signed: 12/09/2022 12:30:01 PM By: [name] MD FACS Entered By: [name] on 12/09/2022 12:01:53 [name] ([id number]) [phone].pdf Page 11 of 11 -------------------------------------------------------------------------------- SuperBill Details Patient Name: Date of Service: [place] [name], [name] 12/09/2022 Medical Record Number: [id number] Patient Account Number: [id number] Date of Birth/Sex: Treating RN: [birth year] (67 y.o. M) Primary Care Provider: Kathyrn Drown Other Clinician: Referring Provider: Treating Provider/Extender: Alean Rinne in Treatment: 4 Diagnosis Coding ICD-10 Codes Code Description [phone] Non-pressure chronic ulcer of other part of right lower leg with unspecified severity L97.312 Non-pressure chronic ulcer of right ankle with fat layer exposed E11.622 Type 2 diabetes mellitus with other skin ulcer [id number] Chronic systolic (congestive) heart failure I73.9 Peripheral vascular disease, unspecified E11.42 Type  2 diabetes mellitus with diabetic polyneuropathy I63.9 Cerebral infarction, unspecified I10 Essential (primary) hypertension G89.4 Chronic pain syndrome L84 Corns and callosities Facility Procedures : CPT4 Code: [id number] Description: [id number] - DEB SUBQ TISSUE 20 SQ CM/< ICD-10 Diagnosis Description L97.819 Non-pressure chronic ulcer of other part of right lower leg with unspecified se L97.312 Non-pressure chronic ulcer of right ankle with fat layer exposed Modifier: verity Quantity: 1 : CPT4 Code: [id number] Description: [id number] - PARE BENIGN LES; SGL ICD-10 Diagnosis Description L84 Corns and callosities Modifier: Quantity: 1 Physician Procedures : CPT4 Code Description Modifier [id number] - WC PHYS LEVEL 3 - EST PT 25 ICD-10 Diagnosis Description L97.819 Non-pressure chronic ulcer of other part of right lower leg with unspecified severity L97.312 Non-pressure chronic ulcer of right ankle with  fat layer exposed E11.622 Type 2 diabetes mellitus with other skin ulcer [id number] Chronic systolic (congestive) heart failure Quantity: 1 : [id number] - WC PHYS SUBQ TISS 20 SQ CM ICD-10 Diagnosis Description L97.819 Non-pressure chronic ulcer of other part of right lower leg with unspecified severity L97.312 Non-pressure chronic ulcer of right ankle with fat layer exposed Quantity: 1 : [id number] - WC PHYS PARE BENIGN LES; SGL ICD-10 Diagnosis Description L84 Corns and callosities Quantity: 1 Electronic Signature(s) Signed: 12/09/2022 12:07:38 PM By: [name] MD FACS Previous Signature: 12/09/2022 12:06:10 PM Version By: [name] MD FACS Entered By: [name] on 12/09/2022 12:07:37

## 2022-12-16 ENCOUNTER — Encounter (HOSPITAL_BASED_OUTPATIENT_CLINIC_OR_DEPARTMENT_OTHER): Payer: Medicare HMO | Attending: General Surgery | Admitting: General Surgery

## 2022-12-16 DIAGNOSIS — E785 Hyperlipidemia, unspecified: Secondary | ICD-10-CM | POA: Insufficient documentation

## 2022-12-16 DIAGNOSIS — I429 Cardiomyopathy, unspecified: Secondary | ICD-10-CM | POA: Insufficient documentation

## 2022-12-16 DIAGNOSIS — I5022 Chronic systolic (congestive) heart failure: Secondary | ICD-10-CM | POA: Diagnosis not present

## 2022-12-16 DIAGNOSIS — I2510 Atherosclerotic heart disease of native coronary artery without angina pectoris: Secondary | ICD-10-CM | POA: Insufficient documentation

## 2022-12-16 DIAGNOSIS — L97312 Non-pressure chronic ulcer of right ankle with fat layer exposed: Secondary | ICD-10-CM | POA: Diagnosis not present

## 2022-12-16 DIAGNOSIS — E1142 Type 2 diabetes mellitus with diabetic polyneuropathy: Secondary | ICD-10-CM | POA: Insufficient documentation

## 2022-12-16 DIAGNOSIS — I110 Hypertensive heart disease with heart failure: Secondary | ICD-10-CM | POA: Diagnosis not present

## 2022-12-16 DIAGNOSIS — E11621 Type 2 diabetes mellitus with foot ulcer: Secondary | ICD-10-CM | POA: Insufficient documentation

## 2022-12-16 DIAGNOSIS — E11622 Type 2 diabetes mellitus with other skin ulcer: Secondary | ICD-10-CM | POA: Diagnosis not present

## 2022-12-16 DIAGNOSIS — E1151 Type 2 diabetes mellitus with diabetic peripheral angiopathy without gangrene: Secondary | ICD-10-CM | POA: Insufficient documentation

## 2022-12-16 DIAGNOSIS — L97512 Non-pressure chronic ulcer of other part of right foot with fat layer exposed: Secondary | ICD-10-CM | POA: Insufficient documentation

## 2022-12-16 DIAGNOSIS — L97812 Non-pressure chronic ulcer of other part of right lower leg with fat layer exposed: Secondary | ICD-10-CM | POA: Diagnosis not present

## 2022-12-19 NOTE — Progress Notes (Signed)
GLENDLE, AUVIL ([id number]) [id number] Page 1 of 10 Visit Report for 12/16/2022 Chief Complaint Document Details Patient Name: Date of Service: [name] 12/16/2022 10:00 A M Medical Record Number: [id number] Patient Account Number: [id number] Date of Birth/Sex: Treating RN: 30-Nov-1955 (67 y.o. M) Primary Care Provider: Kathyrn Drown Other Clinician: Referring Provider: Treating Provider/Extender: Alean Rinne in Treatment: 5 Information Obtained from: Patient Chief Complaint Patients presents for treatment of an open diabetic ulcer on his right foot, as well as ulcers on his right lower leg and ankle Electronic Signature(s) Signed: 12/16/2022 10:32:57 AM By: [name] MD FACS Entered By: [name] on 12/16/2022 10:32:57 -------------------------------------------------------------------------------- Debridement Details Patient Name: Date of Service: [name] RE, [name] 12/16/2022 10:00 A M Medical Record Number: [id number] Patient Account Number: [id number] Date of Birth/Sex: Treating RN: June 17, 1956 (68 y.o. [name] Primary Care Provider: Kathyrn Drown Other Clinician: Referring Provider: Treating Provider/Extender: Alean Rinne in Treatment: 5 Debridement Performed for Assessment: Wound #2 Right,Lateral Ankle Performed By: Physician [name], MD Debridement Type: Debridement Severity of Tissue Pre Debridement: Fat layer exposed Level of Consciousness (Pre-procedure): Awake and Alert Pre-procedure Verification/Time Out Yes - 10:11 Taken: Start Time: 10:12 Pain Control: Lidocaine 5% topical ointment T Area Debrided (L x W): otal 1.5 (cm) x 0.4 (cm) = 0.6 (cm) Tissue and other material debrided: Non-Viable, Slough, Slough Level: Non-Viable Tissue Debridement Description: Selective/Open Wound Instrument: Curette Bleeding: Minimum Hemostasis Achieved:  Pressure Response to Treatment: Procedure was tolerated well Level of Consciousness (Post- Awake and Alert procedure): Post Debridement Measurements of Total Wound Length: (cm) 1.5 Width: (cm) 0.4 Depth: (cm) 0.1 Volume: (cm) 0.047 Character of Wound/Ulcer Post Debridement: Requires Further Debridement Severity of Tissue Post Debridement: Fat layer exposed Post Procedure Diagnosis Same as Pre-procedure Notes Scribed for Dr. Celine Ahr by Blanche East, RN Electronic Signature(s) Signed: 12/16/2022 2:08:45 PM By: [name] MD FACS Signed: 12/16/2022 4:09:38 PM By: Blanche East RN Entered By: Blanche East on 12/16/2022 10:14:12 [name] ([id number]) [id number] Page 2 of 10 -------------------------------------------------------------------------------- Debridement Details Patient Name: Date of Service: [name] 12/16/2022 10:00 A M Medical Record Number: [id number] Patient Account Number: [id number] Date of Birth/Sex: Treating RN: August 31, 1956 (67 y.o. [name] Primary Care Provider: Kathyrn Drown Other Clinician: Referring Provider: Treating Provider/Extender: Alean Rinne in Treatment: 5 Debridement Performed for Assessment: Wound #1 Right,Lateral Lower Leg Performed By: Physician [name], MD Debridement Type: Debridement Severity of Tissue Pre Debridement: Fat layer exposed Level of Consciousness (Pre-procedure): Awake and Alert Pre-procedure Verification/Time Out Yes - 10:11 Taken: Start Time: 10:12 Pain Control: Lidocaine 5% topical ointment T Area Debrided (L x W): otal 5.4 (cm) x 1.2 (cm) = 6.48 (cm) Tissue and other material debrided: Non-Viable, Slough, Slough Level: Non-Viable Tissue Debridement Description: Selective/Open Wound Instrument: Curette Bleeding: Minimum Hemostasis Achieved: Pressure Response to Treatment: Procedure was tolerated well Level of Consciousness  (Post- Awake and Alert procedure): Post Debridement Measurements of Total Wound Length: (cm) 5.4 Width: (cm) 1.2 Depth: (cm) 0.4 Volume: (cm) 2.036 Character of Wound/Ulcer Post Debridement: Requires Further Debridement Severity of Tissue Post Debridement: Fat layer exposed Post Procedure Diagnosis Same as Pre-procedure Notes Scribed for Dr. Celine Ahr by Blanche East, RN Electronic Signature(s) Signed: 12/16/2022 2:08:45 PM By: [name] MD FACS Signed: 12/16/2022 4:09:38 PM By: Blanche East RN Entered By: Blanche East on 12/16/2022 10:14:58 -------------------------------------------------------------------------------- HPI Details Patient Name: Date of  Service: [name] Girt RE, [name] 12/16/2022 10:00 A M Medical Record Number: [id number] Patient Account Number: [id number] Date of Birth/Sex: Treating RN: 08/26/1956 (67 y.o. 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 He has a remote history of surgery on his right ankle and apparently as his legs have become increasingly more swollen and he has insisted upon wearing boots, the pressure and friction seem to have opened up wounds along the old scar line. In addition, he has a large callus on the third metatarsal head, related to his acquired foot deformity. He sees a podiatrist who periodically shaves this callus down and he was also recommended orthotics, but the patient has refused them because he likes his boots. There is an area of discoloration in  the lower portion of this callus, suspicious for an open wound. 11/14/2022: The ulcer on his third metatarsal head has closed. The lateral leg ulcers are both about the same size, but he says that they feel better. There is [name], [name] ([id number]) [id number] Page 3 of 10 slough accumulation in both sites. 11/23/2022: The ulcer on his foot remains closed. The lateral leg ulcers are still unchanged in size, but are a bit cleaner, just with some slough accumulation. 12/01/2022: No significant change to the size of the wounds on his lateral leg. The larger is quite clean with good granulation tissue present. The smaller still has fairly thick slough buildup. 12/09/2022: Both wounds are smaller today. They still have slough accumulation, but less so than at his previous visit. The callus on his foot has built up again and is uncomfortable. 12/16/2022: The wound at his ankle is nearly closed with just a little slough on the remaining open surface. The lateral leg wound is about the same size but has less slough accumulation. We are still working on Biochemist, clinical for wound VAC. Electronic Signature(s) Signed: 12/16/2022 10:33:36 AM By: [name] MD FACS Entered By: [name] on 12/16/2022 10:33:36 -------------------------------------------------------------------------------- Physical Exam Details Patient Name: Date of Service: [name] RE, [name] 12/16/2022 10:00 A M Medical Record Number: [id number] Patient Account Number: [id number] Date of Birth/Sex: Treating RN: 04-04-1956 (67 y.o. M) Primary Care Provider: Kathyrn Drown Other Clinician: Referring Provider: Treating Provider/Extender: Kristine Royal, Scott A Weeks in Treatment: 5 Constitutional Hypertensive, asymptomatic. . . . no acute distress. Respiratory Normal work of breathing on room air. Notes 12/16/2022: The wound at his ankle is nearly closed with just a little slough on the  remaining open surface. The lateral leg wound is about the same size but has less slough accumulation. Electronic Signature(s) Signed: 12/16/2022 10:34:08 AM By: [name] MD FACS Entered By: [name] on 12/16/2022 10:34:08 -------------------------------------------------------------------------------- Physician Orders Details Patient Name: Date of Service: [name] RE, [name] 12/16/2022 10:00 A M Medical Record Number: [id number] Patient Account Number: [id number] Date of Birth/Sex: Treating RN: Nov 15, 1955 (67 y.o. [name] Primary Care Provider: Kathyrn Drown Other Clinician: Referring Provider: Treating Provider/Extender: Alean Rinne in Treatment: 5 Verbal / Phone Orders: No Diagnosis Coding ICD-10 Coding Code Description L97.819 Non-pressure chronic ulcer  of other part of right lower leg with unspecified severity L97.312 Non-pressure chronic ulcer of right ankle with fat layer exposed E11.622 Type 2 diabetes mellitus with other skin ulcer [id number] Chronic systolic (congestive) heart failure I73.9 Peripheral vascular disease, unspecified E11.42 Type 2 diabetes mellitus with diabetic polyneuropathy I63.9 Cerebral infarction, unspecified I10 Essential (primary) hypertension G89.4 Chronic pain syndrome L84 Corns and callosities Follow-up Appointments [name], [name] ([id number]) [id number] Page 4 of 10 ppointment in 1 week. - Dr. Celine Ahr Rm 3 Return A Friday 12/23/22 at 10:15am Anesthetic Wound #1 Right,Lateral Lower Leg (In clinic) Topical Lidocaine 4% applied to wound bed Wound #2 Right,Lateral Ankle (In clinic) Topical Lidocaine 4% applied to wound bed Bathing/ Shower/ Hygiene May shower with protection but do not get wound dressing(s) wet. Protect dressing(s) with water repellant cover (for example, large plastic bag) or a cast cover and may then take shower. - May purchase cast protector at [facility], or [facility] Negative Presssure Wound Therapy Wound #1 Right,Lateral Lower Leg Wound Vac to wound continuously at 165m/hg pressure - RUN IVR WOUND VAC for Right Lateral leg Black Foam Edema Control - Lymphedema / SCD / Other Right Lower Extremity Elevate legs to the level of the heart or above for 30 minutes daily and/or when sitting for 3-4 times a day throughout the day. Avoid standing for long periods of time. Wound Treatment Wound #1 - Lower Leg Wound Laterality: Right, Lateral Cleanser: Soap and Water 1 x Per Week/30 Days Discharge Instructions: May shower and wash wound with dial antibacterial soap and water prior to dressing change. Cleanser: Wound Cleanser 1 x Per Week/30 Days Discharge Instructions: Cleanse the wound with wound cleanser prior to applying a clean dressing using gauze sponges, not tissue or cotton balls. Peri-Wound Care: Sween Lotion (Moisturizing lotion) 1 x Per Week/30 Days Discharge Instructions: Apply moisturizing lotion as directed Prim Dressing: Sorbalgon AG Dressing 2x2 (in/in) 1 x Per Week/30 Days ary Discharge Instructions: Apply to wound bed as instructed Secondary Dressing: Zetuvit Plus 4x8 in 1 x Per Week/30 Days Discharge Instructions: Apply over primary dressing as directed. Secured With: Transpore Surgical Tape, 2x10 (in/yd) 1 x Per Week/30 Days Discharge Instructions: Secure dressing with tape as directed. Compression Wrap: ThreePress (3 layer compression wrap) 1 x Per Week/30 Days Discharge Instructions: Apply three layer compression as directed. Wound #2 - Ankle Wound Laterality: Right, Lateral Cleanser: Soap and Water 1 x Per Week/30 Days Discharge Instructions: May shower and wash wound with dial antibacterial soap and water prior to dressing change. Cleanser: Wound Cleanser 1 x Per Week/30 Days Discharge Instructions: Cleanse the wound with wound cleanser prior to applying a clean dressing using gauze sponges, not tissue or cotton  balls. Peri-Wound Care: Sween Lotion (Moisturizing lotion) 1 x Per Week/30 Days Discharge Instructions: Apply moisturizing lotion as directed Prim Dressing: Sorbalgon AG Dressing 2x2 (in/in) 1 x Per Week/30 Days ary Discharge Instructions: Apply to wound bed as instructed Secondary Dressing: Zetuvit Plus 4x8 in 1 x Per Week/30 Days Discharge Instructions: Apply over primary dressing as directed. Secured With: Transpore Surgical Tape, 2x10 (in/yd) 1 x Per Week/30 Days Discharge Instructions: Secure dressing with tape as directed. Compression Wrap: ThreePress (3 layer compression wrap) 1 x Per Week/30 Days Discharge Instructions: Apply three layer compression as directed. Electronic Signature(s) Signed: 12/16/2022 2:08:45 PM By: CFredirick MaudlinMD FACS Entered By: CFredirick Maudlinon 12/16/2022 10:34:24 WEdwyna Shell([id number] Page 5 of 10 -------------------------------------------------------------------------------- Problem List Details Patient Name: Date of Service: [place]  RE, [name] 12/16/2022 10:00 A M Medical Record Number: [id number] Patient Account Number: [id number] Date of Birth/Sex: Treating RN: Jun 30, 1956 (67 y.o. M) Primary Care Provider: Kathyrn Drown Other Clinician: Referring Provider: Treating Provider/Extender: Alean Rinne in Treatment: 5 Active Problems ICD-10 Encounter Code Description Active Date MDM Diagnosis L97.819 Non-pressure chronic ulcer of other part of right lower leg with unspecified 11/07/2022 No Yes severity L97.312 Non-pressure chronic ulcer of right ankle with fat layer exposed 11/07/2022 No Yes E11.622 Type 2 diabetes mellitus with other skin ulcer 11/07/2022 No Yes [id number] Chronic systolic (congestive) heart failure 11/07/2022 No Yes I73.9 Peripheral vascular disease, unspecified 11/07/2022 No Yes E11.42 Type 2 diabetes mellitus with diabetic polyneuropathy 11/07/2022 No Yes I63.9  Cerebral infarction, unspecified 11/07/2022 No Yes I10 Essential (primary) hypertension 11/07/2022 No Yes G89.4 Chronic pain syndrome 11/07/2022 No Yes L84 Corns and callosities 12/09/2022 No Yes Inactive Problems ICD-10 Code Description Active Date Inactive Date L97.512 Non-pressure chronic ulcer of other part of right foot with fat layer exposed 11/07/2022 11/07/2022 [id number] Type 2 diabetes mellitus with foot ulcer 11/07/2022 11/07/2022 Resolved Problems Electronic Signature(s) Signed: 12/16/2022 10:32:43 AM By: [name] MD FACS Entered By: [name] on 12/16/2022 10:32:43 [name] ([id number]) [id number] Page 6 of 10 -------------------------------------------------------------------------------- Progress Note Details Patient Name: Date of Service: [name] 12/16/2022 10:00 A M Medical Record Number: [id number] Patient Account Number: [id number] Date of Birth/Sex: Treating RN: December 29, 1955 (67 y.o. M) Primary Care Provider: Kathyrn Drown Other Clinician: Referring Provider: Treating Provider/Extender: Alean Rinne in Treatment: 5 Subjective Chief Complaint Information obtained from Patient Patients presents for treatment of an open diabetic ulcer on his right foot, as well as ulcers on his right lower leg and ankle History of Present Illness (HPI) ADMISSION 11/07/2022 This is a 67 year old man with a past medical history significant for type 2 diabetes (last hemoglobin A1c 7.1), cerebrovascular occlusive disease, coronary artery disease, peripheral vascular disease, congestive heart failure, chronic pain syndrome, and prior amputation of his right great and second toes. He has a remote history of surgery on his right ankle and apparently as his legs have become increasingly more swollen and he has insisted upon wearing boots, the pressure and friction seem to have opened up wounds along the old scar line. In  addition, he has a large callus on the third metatarsal head, related to his acquired foot deformity. He sees a podiatrist who periodically shaves this callus down and he was also recommended orthotics, but the patient has refused them because he likes his boots. There is an area of discoloration in the lower portion of this callus, suspicious for an open wound. 11/14/2022: The ulcer on his third metatarsal head has closed. The lateral leg ulcers are both about the same size, but he says that they feel better. There is slough accumulation in both sites. 11/23/2022: The ulcer on his foot remains closed. The lateral leg ulcers are still unchanged in size, but are a bit cleaner, just with some slough accumulation. 12/01/2022: No significant change to the size of the wounds on his lateral leg. The larger is quite clean with good granulation tissue present. The smaller still has fairly thick slough buildup. 12/09/2022: Both wounds are smaller today. They still have slough accumulation, but less so than at his previous visit. The callus on his foot has built up again and is uncomfortable. 12/16/2022: The wound at his ankle is nearly closed with just  a little slough on the remaining open surface. The lateral leg wound is about the same size but has less slough accumulation. We are still working on Biochemist, clinical for wound VAC. Patient History Family History Cancer - Father,Mother,Siblings, Diabetes - Mother,Siblings, Heart Disease, Lung Disease - Father, No family history of Hereditary Spherocytosis, Hypertension, Kidney Disease, Seizures, Stroke, Thyroid Problems, Tuberculosis. Social History Former smoker - ended on 12/23/1970, Marital Status - Married, Alcohol Use - Never, Drug Use - No History, Caffeine Use - Daily. Medical History Eyes Denies history of Cataracts, Glaucoma, Optic Neuritis Ear/Nose/Mouth/Throat Denies history of Chronic sinus problems/congestion, Middle ear  problems Respiratory Denies history of Aspiration, Asthma, Chronic Obstructive Pulmonary Disease (COPD), Pneumothorax, Sleep Apnea, Tuberculosis Cardiovascular Patient has history of Congestive Heart Failure, Hypertension, Peripheral Venous Disease Endocrine Patient has history of Type II Diabetes Musculoskeletal Patient has history of Osteoarthritis Neurologic Patient has history of Neuropathy Oncologic Denies history of Received Chemotherapy, Received Radiation Psychiatric Denies history of Anorexia/bulimia, Confinement Anxiety Medical A Surgical History Notes nd Cardiovascular cardiomyopathy hyperlipidemia hypokalemia stroke Genitourinary orchitis [name], [name] ([id number]) [id number] Page 7 of 10 Objective Constitutional Hypertensive, asymptomatic. no acute distress. Vitals Time Taken: 9:57 AM, Height: 74 in, Weight: 252 lbs, BMI: 32.4, Temperature: 98.4 F, Pulse: 96 bpm, Respiratory Rate: 20 breaths/min, Blood Pressure: 159/91 mmHg, Capillary Blood Glucose: 117 mg/dl. Respiratory Normal work of breathing on room air. General Notes: 12/16/2022: The wound at his ankle is nearly closed with just a little slough on the remaining open surface. The lateral leg wound is about the same size but has less slough accumulation. Integumentary (Hair, Skin) Wound #1 status is Open. Original cause of wound was Shear/Friction. The date acquired was: 09/16/2022. The wound has been in treatment 5 weeks. The wound is located on the Right,Lateral Lower Leg. The wound measures 5.4cm length x 1.2cm width x 0.4cm depth; 5.089cm^2 area and 2.036cm^3 volume. There is Fat Layer (Subcutaneous Tissue) exposed. There is no tunneling or undermining noted. There is a medium amount of serosanguineous drainage noted. There is large (67-100%) red granulation within the wound bed. There is a small (1-33%) amount of necrotic tissue within the wound bed including Eschar and Adherent  Slough. The periwound skin appearance had no abnormalities noted for moisture. The periwound skin appearance exhibited: Scarring, Hemosiderin Staining. Periwound temperature was noted as No Abnormality. Wound #2 status is Open. Original cause of wound was Shear/Friction. The date acquired was: 09/16/2022. The wound has been in treatment 5 weeks. The wound is located on the Right,Lateral Ankle. The wound measures 1.5cm length x 0.4cm width x 0.1cm depth; 0.471cm^2 area and 0.047cm^3 volume. There is Fat Layer (Subcutaneous Tissue) exposed. There is no tunneling or undermining noted. There is a medium amount of serous drainage noted. There is large (67- 100%) red granulation within the wound bed. There is a small (1-33%) amount of necrotic tissue within the wound bed including Eschar and Adherent Slough. The periwound skin appearance had no abnormalities noted for moisture. The periwound skin appearance exhibited: Scarring, Hemosiderin Staining. Periwound temperature was noted as No Abnormality. Assessment Active Problems ICD-10 Non-pressure chronic ulcer of other part of right lower leg with unspecified severity Non-pressure chronic ulcer of right ankle with fat layer exposed Type 2 diabetes mellitus with other skin ulcer Chronic systolic (congestive) heart failure Peripheral vascular disease, unspecified Type 2 diabetes mellitus with diabetic polyneuropathy Cerebral infarction, unspecified Essential (primary) hypertension Chronic pain syndrome Corns and callosities Procedures Wound #1 Pre-procedure diagnosis of Wound #1 is  a Diabetic Wound/Ulcer of the Lower Extremity located on the Right,Lateral Lower Leg .Severity of Tissue Pre Debridement is: Fat layer exposed. There was a Selective/Open Wound Non-Viable Tissue Debridement with a total area of 6.48 sq cm performed by [name], MD. With the following instrument(s): Curette to remove Non-Viable tissue/material. Material removed  includes [facility] after achieving pain control using Lidocaine 5% topical ointment. No specimens were taken. A time out was conducted at 10:11, prior to the start of the procedure. A Minimum amount of bleeding was controlled with Pressure. The procedure was tolerated well. Post Debridement Measurements: 5.4cm length x 1.2cm width x 0.4cm depth; 2.036cm^3 volume. Character of Wound/Ulcer Post Debridement requires further debridement. Severity of Tissue Post Debridement is: Fat layer exposed. Post procedure Diagnosis Wound #1: Same as Pre-Procedure General Notes: Scribed for Dr. Celine Ahr by Blanche East, RN. Pre-procedure diagnosis of Wound #1 is a Diabetic Wound/Ulcer of the Lower Extremity located on the Right,Lateral Lower Leg . There was a Three Layer Compression Therapy Procedure by Blanche East, RN. Post procedure Diagnosis Wound #1: Same as Pre-Procedure Wound #2 Pre-procedure diagnosis of Wound #2 is a Diabetic Wound/Ulcer of the Lower Extremity located on the Right,Lateral Ankle .Severity of Tissue Pre Debridement is: Fat layer exposed. There was a Selective/Open Wound Non-Viable Tissue Debridement with a total area of 0.6 sq cm performed by [name], MD. With the following instrument(s): Curette to remove Non-Viable tissue/material. Material removed includes [facility] after achieving pain control using Lidocaine 5% topical ointment. No specimens were taken. A time out was conducted at 10:11, prior to the start of the procedure. A Minimum amount of bleeding was controlled with Pressure. The procedure was tolerated well. Post Debridement Measurements: 1.5cm length x 0.4cm width x 0.1cm depth; 0.047cm^3 volume. Character of Wound/Ulcer Post Debridement requires further debridement. Severity of Tissue Post Debridement is: Fat layer exposed. Post procedure Diagnosis Wound #2: Same as Pre-Procedure General Notes: Scribed for Dr. Celine Ahr by Blanche East, RN. Pre-procedure diagnosis of Wound #2  is a Diabetic Wound/Ulcer of the Lower Extremity located on the Right,Lateral Ankle . There was a Three Layer Compression Therapy Procedure by Blanche East, RN. Post procedure Diagnosis Wound #2: Same as Pre-Procedure WALLEY, [name] ([id number]) [id number] Page 8 of 10 Plan Follow-up Appointments: Return Appointment in 1 week. - Dr. Celine Ahr Rm 3 Friday 12/23/22 at 10:15am Anesthetic: Wound #1 Right,Lateral Lower Leg: (In clinic) Topical Lidocaine 4% applied to wound bed Wound #2 Right,Lateral Ankle: (In clinic) Topical Lidocaine 4% applied to wound bed Bathing/ Shower/ Hygiene: May shower with protection but do not get wound dressing(s) wet. Protect dressing(s) with water repellant cover (for example, large plastic bag) or a cast cover and may then take shower. - May purchase cast protector at [facility], [facility], or [facility] Negative [facility] Wound Therapy: Wound #1 Right,Lateral Lower Leg: Wound Vac to wound continuously at 133m/hg pressure - RUN IVR WOUND VAC for Right Lateral leg Black Foam Edema Control - Lymphedema / SCD / Other: Elevate legs to the level of the heart or above for 30 minutes daily and/or when sitting for 3-4 times a day throughout the day. Avoid standing for long periods of time. WOUND #1: - Lower Leg Wound Laterality: Right, Lateral Cleanser: Soap and Water 1 x Per Week/30 Days Discharge Instructions: May shower and wash wound with dial antibacterial soap and water prior to dressing change. Cleanser: Wound Cleanser 1 x Per Week/30 Days Discharge Instructions: Cleanse the wound with wound cleanser prior to applying a clean  dressing using gauze sponges, not tissue or cotton balls. Peri-Wound Care: Sween Lotion (Moisturizing lotion) 1 x Per Week/30 Days Discharge Instructions: Apply moisturizing lotion as directed Prim Dressing: Sorbalgon AG Dressing 2x2 (in/in) 1 x Per Week/30 Days ary Discharge Instructions: Apply to wound bed as  instructed Secondary Dressing: Zetuvit Plus 4x8 in 1 x Per Week/30 Days Discharge Instructions: Apply over primary dressing as directed. Secured With: Transpore Surgical T ape, 2x10 (in/yd) 1 x Per Week/30 Days Discharge Instructions: Secure dressing with tape as directed. Com pression Wrap: ThreePress (3 layer compression wrap) 1 x Per Week/30 Days Discharge Instructions: Apply three layer compression as directed. WOUND #2: - Ankle Wound Laterality: Right, Lateral Cleanser: Soap and Water 1 x Per Week/30 Days Discharge Instructions: May shower and wash wound with dial antibacterial soap and water prior to dressing change. Cleanser: Wound Cleanser 1 x Per Week/30 Days Discharge Instructions: Cleanse the wound with wound cleanser prior to applying a clean dressing using gauze sponges, not tissue or cotton balls. Peri-Wound Care: Sween Lotion (Moisturizing lotion) 1 x Per Week/30 Days Discharge Instructions: Apply moisturizing lotion as directed Prim Dressing: Sorbalgon AG Dressing 2x2 (in/in) 1 x Per Week/30 Days ary Discharge Instructions: Apply to wound bed as instructed Secondary Dressing: Zetuvit Plus 4x8 in 1 x Per Week/30 Days Discharge Instructions: Apply over primary dressing as directed. Secured With: Transpore Surgical T ape, 2x10 (in/yd) 1 x Per Week/30 Days Discharge Instructions: Secure dressing with tape as directed. Com pression Wrap: ThreePress (3 layer compression wrap) 1 x Per Week/30 Days Discharge Instructions: Apply three layer compression as directed. 12/16/2022: The wound at his ankle is nearly closed with just a little slough on the remaining open surface. The lateral leg wound is about the same size but has less slough accumulation. I used a curette to debride slough from both wound sites. We will continue to pack the cavity on the lateral leg wound with silver alginate and apply silver alginate to the wound at his ankle. We will continue 3 layer compression. I think  the ankle wound will likely be closed at his next visit. I am hopeful we will be able to obtain approval for wound VAC so that we can speed up the healing of the lateral leg wound. Follow-up in 1 week. Electronic Signature(s) Signed: 12/16/2022 10:35:11 AM By: [name] MD FACS Entered By: [name] on 12/16/2022 10:35:10 -------------------------------------------------------------------------------- HxROS Details Patient Name: Date of Service: [name] RE, [name] 12/16/2022 10:00 A M Medical Record Number: [id number] Patient Account Number: [id number] Date of Birth/Sex: Treating RN: July 13, 1956 (67 y.o. M) Primary Care Provider: Kathyrn Drown Other Clinician: Referring Provider: Treating Provider/Extender: Alean Rinne in Treatment: [address] SANIL, [name] ([id number]) [id number] Page 9 of 10 Medical History: Negative for: Cataracts; Glaucoma; Optic Neuritis Ear/Nose/Mouth/Throat Medical History: Negative for: Chronic sinus problems/congestion; Middle ear problems Respiratory Medical History: Negative for: Aspiration; Asthma; Chronic Obstructive Pulmonary Disease (COPD); Pneumothorax; Sleep Apnea; Tuberculosis Cardiovascular Medical History: Positive for: Congestive Heart Failure; Hypertension; Peripheral Venous Disease Past Medical History Notes: cardiomyopathy hyperlipidemia hypokalemia stroke Endocrine Medical History: Positive for: Type II Diabetes Time with diabetes: 20 years Treated with: Insulin, Oral agents Genitourinary Medical History: Past Medical History Notes: orchitis Musculoskeletal Medical History: Positive for: Osteoarthritis Neurologic Medical History: Positive for: Neuropathy Oncologic Medical History: Negative for: Received Chemotherapy; Received Radiation Psychiatric Medical History: Negative for: Anorexia/bulimia; Confinement Anxiety Immunizations Pneumococcal  Vaccine: Received Pneumococcal Vaccination: Yes Received Pneumococcal Vaccination On or After  60th Birthday: Yes Implantable Devices No devices added Family and Social History Cancer: Yes - Father,Mother,Siblings; Diabetes: Yes - Mother,Siblings; Heart Disease: Yes; Hereditary Spherocytosis: No; Hypertension: No; Kidney Disease: No; Lung Disease: Yes - Father; Seizures: No; Stroke: No; Thyroid Problems: No; Tuberculosis: No; Former smoker - ended on 12/23/1970; Marital Status - Married; Alcohol Use: Never; Drug Use: No History; Caffeine Use: Daily; Financial Concerns: No; Food, Clothing or Shelter Needs: No; Support System Lacking: No; Transportation Concerns: No Electronic Signature(s) Signed: 12/16/2022 2:08:45 PM By: [name] MD FACS Entered By: [name] on 12/16/2022 10:33:44 [name] ([id number]) [id number] Page 10 of 10 -------------------------------------------------------------------------------- SuperBill Details Patient Name: Date of Service: [name] 12/16/2022 Medical Record Number: [id number] Patient Account Number: [id number] Date of Birth/Sex: Treating RN: [birth year] (67 y.o. M) Primary Care Provider: Kathyrn Drown Other Clinician: Referring Provider: Treating Provider/Extender: Alean Rinne in Treatment: 5 Diagnosis Coding ICD-10 Codes Code Description [phone] Non-pressure chronic ulcer of other part of right lower leg with unspecified severity L97.312 Non-pressure chronic ulcer of right ankle with fat layer exposed E11.622 Type 2 diabetes mellitus with other skin ulcer [id number] Chronic systolic (congestive) heart failure I73.9 Peripheral vascular disease, unspecified E11.42 Type 2 diabetes mellitus with diabetic polyneuropathy I63.9 Cerebral infarction, unspecified I10 Essential (primary) hypertension G89.4 Chronic pain syndrome L84 Corns and callosities Facility Procedures : CPT4  Code: [id number] Description: [id number] - DEBRIDE WOUND 1ST 20 SQ CM OR < ICD-10 Diagnosis Description L97.819 Non-pressure chronic ulcer of other part of right lower leg with unspecified seve L97.312 Non-pressure chronic ulcer of right ankle with fat layer exposed Modifier: rity Quantity: 1 Physician Procedures : CPT4 Code Description Modifier [id number] - WC PHYS LEVEL 4 - EST PT 25 ICD-10 Diagnosis Description L97.819 Non-pressure chronic ulcer of other part of right lower leg with unspecified severity L97.312 Non-pressure chronic ulcer of right ankle with  fat layer exposed E11.622 Type 2 diabetes mellitus with other skin ulcer [id number] Chronic systolic (congestive) heart failure Quantity: 1 : [id number] - WC PHYS DEBR WO ANESTH 20 SQ CM ICD-10 Diagnosis Description L97.819 Non-pressure chronic ulcer of other part of right lower leg with unspecified severity L97.312 Non-pressure chronic ulcer of right ankle with fat layer exposed Quantity: 1 Electronic Signature(s) Signed: 12/16/2022 10:35:34 AM By: [name] MD FACS Entered By: [name] on 12/16/2022 10:35:33

## 2022-12-19 NOTE — Progress Notes (Signed)
[name] ([id number]) [id number] Page 1 of 9 Visit Report for 12/16/2022 Arrival Information Details Patient Name: Date of Service: [name], [name] 12/16/2022 10:00 A M Medical Record Number: [id number] Patient Account Number: [id number] Date of Birth/Sex: Treating RN: 05-Dec-1955 (67 y.o. M) Primary Care Jacobie Stamey: Kathyrn Drown Other Clinician: Referring Previn Jian: Treating Keidy Thurgood/Extender: Alean Rinne in Treatment: 5 Visit Information History Since Last Visit All ordered tests and consults were completed: No Patient Arrived: Ambulatory Added or deleted any medications: No Arrival Time: 09:56 Any new allergies or adverse reactions: No Accompanied By: wife Had a fall or experienced change in No Transfer Assistance: None activities of daily living that may affect Patient Identification Verified: Yes risk of falls: Secondary Verification Process Completed: Yes Signs or symptoms of abuse/neglect since last visito No Patient Requires Transmission-Based Precautions: No Hospitalized since last visit: No Patient Has Alerts: Yes Implantable device outside of the clinic excluding No Patient Alerts: ABI RLE [place] cellular tissue based products placed in the center since last visit: Pain Present Now: No Electronic Signature(s) Signed: 12/16/2022 10:56:17 AM By: Worthy Rancher Entered By: Worthy Rancher on 12/16/2022 09:57:28 -------------------------------------------------------------------------------- Compression Therapy Details Patient Name: Date of Service: [name] 12/16/2022 10:00 A M Medical Record Number: [id number] Patient Account Number: [id number] Date of Birth/Sex: Treating RN: 1956/01/17 (67 y.o. Waldron Session Primary Care Mansour Balboa: Kathyrn Drown Other Clinician: Referring Jailin Moomaw: Treating Berry Gallacher/Extender: Alean Rinne in Treatment: 5 Compression Therapy Performed for Wound  Assessment: Wound #1 Right,Lateral Lower Leg Performed By: Clinician Blanche East, RN Compression Type: Three Layer Post Procedure Diagnosis Same as Pre-procedure Electronic Signature(s) Signed: 12/16/2022 4:09:38 PM By: Blanche East RN Entered By: Blanche East on 12/16/2022 10:15:25 -------------------------------------------------------------------------------- Compression Therapy Details Patient Name: Date of Service: [name] Setters D. 12/16/2022 10:00 A M Medical Record Number: [id number] Patient Account Number: [id number] Date of Birth/Sex: Treating RN: May 13, 1956 (67 y.o. Waldron Session Primary Care Tannisha Kennington: Kathyrn Drown Other Clinician: Referring Ramiyah Mcclenahan: Treating Devanny Palecek/Extender: Alean Rinne in Treatment: 5 Compression Therapy Performed for Wound Assessment: Wound #2 Right,Lateral Ankle Performed By: Clinician Blanche East, RN Compression Type: Three Layer Post Procedure Diagnosis Same as Pre-procedure NIXEN, AVALO ([id number]) [id number] Page 2 of 9 Electronic Signature(s) Signed: 12/16/2022 4:09:38 PM By: Blanche East RN Entered By: Blanche East on 12/16/2022 10:15:44 -------------------------------------------------------------------------------- Encounter Discharge Information Details Patient Name: Date of Service: [name], [name] 12/16/2022 10:00 A M Medical Record Number: [id number] Patient Account Number: [id number] Date of Birth/Sex: Treating RN: 12-06-1955 (67 y.o. Waldron Session Primary Care [place]: Kathyrn Drown Other Clinician: Referring Arnice Vanepps: Treating Dexter Sauser/Extender: Alean Rinne in Treatment: 5 Encounter Discharge Information Items Post Procedure Vitals Discharge Condition: Stable Temperature (F): 98.4 Ambulatory Status: Ambulatory Pulse (bpm): 96 Discharge Destination: Home Respiratory Rate (breaths/min): 20 Transportation: Private Auto Blood  Pressure (mmHg): 159/91 Accompanied By: wife Schedule Follow-up Appointment: Yes Clinical Summary of Care: Electronic Signature(s) Signed: 12/16/2022 11:11:06 AM By: Blanche East RN Previous Signature: 12/16/2022 11:10:56 AM Version By: Blanche East RN Entered By: Blanche East on 12/16/2022 11:11:06 -------------------------------------------------------------------------------- Lower Extremity Assessment Details Patient Name: Date of Service: [name] Setters D. 12/16/2022 10:00 A M Medical Record Number: [id number] Patient Account Number: [id number] Date of Birth/Sex: Treating RN: 10-23-1955 (67 y.o. Waldron Session Primary Care Raylon Lamson: Kathyrn Drown Other Clinician: Referring Jakari Jacot: Treating Jamyria Ozanich/Extender: Monna Fam  Weeks in Treatment: 5 Edema Assessment Assessed: [Left: No] [Right: No] [Left: Edema] [Right: :] Calf Left: Right: Point of Measurement: From Medial Instep 38 cm Ankle Left: Right: Point of Measurement: From Medial Instep 27.5 cm Vascular Assessment Pulses: Dorsalis Pedis Palpable: [Right:Yes] Electronic Signature(s) Signed: 12/16/2022 4:09:38 PM By: Blanche East RN Entered By: Blanche East on 12/16/2022 10:01:00 -------------------------------------------------------------------------------- Multi Wound Chart Details Patient Name: Date of Service: [name], [name] 12/16/2022 10:00 A Teresa Coombs ([id number]) [id number] Page 3 of 9 Medical Record Number: [id number] Patient Account Number: [id number] Date of Birth/Sex: Treating RN: Nov 18, 1955 (67 y.o. M) Primary Care Laquida Cotrell: Kathyrn Drown Other Clinician: Referring Orianna Biskup: Treating Karo Rog/Extender: Kristine Royal, Scott A Weeks in Treatment: 5 Vital Signs Height(in): 74 Capillary Blood Glucose(mg/dl): 117 Weight(lbs): 252 Pulse(bpm): 72 Body Mass Index(BMI): 32.4 Blood Pressure(mmHg): 159/91 Temperature(F):  98.4 Respiratory Rate(breaths/min): 20 [1:Photos:] [N/A:N/A] Right, Lateral Lower Leg Right, Lateral Ankle N/A Wound Location: Shear/Friction Shear/Friction N/A Wounding Event: Diabetic Wound/Ulcer of the Lower Diabetic Wound/Ulcer of the Lower N/A Primary Etiology: Extremity Extremity Congestive Heart Failure, Congestive Heart Failure, N/A Comorbid History: Hypertension, Peripheral Venous Hypertension, Peripheral Venous Disease, Type II Diabetes, Disease, Type II Diabetes, Osteoarthritis, Neuropathy Osteoarthritis, Neuropathy 09/16/2022 09/16/2022 N/A Date Acquired: 5 5 N/A Weeks of Treatment: Open Open N/A Wound Status: No No N/A Wound Recurrence: 5.4x1.2x0.4 1.5x0.4x0.1 N/A Measurements L x W x D (cm) 5.089 0.471 N/A A (cm) : rea 2.036 0.047 N/A Volume (cm) : -20.00% 33.40% N/A % Reduction in A rea: -60.10% 66.70% N/A % Reduction in Volume: Grade 1 Grade 1 N/A Classification: Medium Medium N/A Exudate A mount: Serosanguineous Serous N/A Exudate Type: red, brown amber N/A Exudate Color: Large (67-100%) Large (67-100%) N/A Granulation A mount: Red Red N/A Granulation Quality: Small (1-33%) Small (1-33%) N/A Necrotic A mount: Eschar, Adherent Slough Eschar, Adherent Slough N/A Necrotic Tissue: Fat Layer (Subcutaneous Tissue): Yes Fat Layer (Subcutaneous Tissue): Yes N/A Exposed Structures: Fascia: No Fascia: No Tendon: No Tendon: No Muscle: No Muscle: No Joint: No Joint: No Bone: No Bone: No Small (1-33%) Small (1-33%) N/A Epithelialization: Debridement - Selective/Open Wound Debridement - Selective/Open Wound N/A Debridement: Pre-procedure Verification/Time Out 10:11 10:11 N/A Taken: Lidocaine 5% topical ointment Lidocaine 5% topical ointment N/A Pain Control: [employer] N/A Tissue Debrided: Non-Viable Tissue Non-Viable Tissue N/A Level: 6.48 0.6 N/A Debridement A (sq cm): rea Curette Curette N/A Instrument: Minimum Minimum  N/A Bleeding: Pressure Pressure N/A Hemostasis A chieved: Procedure was tolerated well Procedure was tolerated well N/A Debridement Treatment Response: 5.4x1.2x0.4 1.5x0.4x0.1 N/A Post Debridement Measurements L x W x D (cm) 2.036 0.047 N/A Post Debridement Volume: (cm) Scarring: Yes Scarring: Yes N/A Periwound Skin Texture: No Abnormalities Noted No Abnormalities Noted N/A Periwound Skin Moisture: Hemosiderin Staining: Yes Hemosiderin Staining: Yes N/A Periwound Skin Color: No Abnormality No Abnormality N/A Temperature: Compression Therapy Compression Therapy N/A Procedures Performed: Debridement Debridement Treatment Notes [name], [name] ([id number]) [phone].pdf Page 4 of 9 Electronic Signature(s) Signed: 12/16/2022 10:32:50 AM By: [name] MD FACS Entered By: [name] on 12/16/2022 10:32:50 -------------------------------------------------------------------------------- Multi-Disciplinary Care Plan Details Patient Name: Date of Service: [name], [name] 12/16/2022 10:00 A M Medical Record Number: [id number] Patient Account Number: [id number] Date of Birth/Sex: Treating RN: May 14, 1956 (67 y.o. Waldron Session Primary Care Merryn Thaker: Kathyrn Drown Other Clinician: Referring Shyler Hamill: Treating Lareina Espino/Extender: Alean Rinne in Treatment: 5 Multidisciplinary Care Plan reviewed with physician Active Inactive Venous Leg Ulcer Nursing Diagnoses: Knowledge  deficit related to disease process and management Potential for venous Insuffiency (use before diagnosis confirmed) Goals: Patient will maintain optimal edema control Date Initiated: 11/23/2022 Target Resolution Date: 03/17/2023 Goal Status: Active Interventions: Assess peripheral edema status every visit. Compression as ordered Treatment Activities: Therapeutic compression applied : 11/23/2022 Notes: Wound/Skin Impairment Nursing  Diagnoses: Impaired tissue integrity Goals: Patient/caregiver will verbalize understanding of skin care regimen Date Initiated: 11/23/2022 Target Resolution Date: 03/17/2023 Goal Status: Active Ulcer/skin breakdown will have a volume reduction of 30% by week 4 Date Initiated: 11/07/2022 Target Resolution Date: 03/17/2023 Goal Status: Active Interventions: Assess patient/caregiver ability to obtain necessary supplies Assess patient/caregiver ability to perform ulcer/skin care regimen upon admission and as needed Assess ulceration(s) every visit Treatment Activities: Skin care regimen initiated : 11/07/2022 Topical wound management initiated : 11/07/2022 Notes: Electronic Signature(s) Signed: 12/16/2022 4:09:38 PM By: Blanche East RN Entered By: Blanche East on 12/16/2022 10:17:33 -------------------------------------------------------------------------------- Pain Assessment Details Patient Name: Date of Service: [name], [name] 12/16/2022 10:00 A Teresa Coombs ([id number]) [phone].pdf Page 5 of 9 Medical Record Number: [id number] Patient Account Number: [id number] Date of Birth/Sex: Treating RN: [birth year] (68 y.o. M) Primary Care Brityn Mastrogiovanni: Kathyrn Drown Other Clinician: Referring Raiford Fetterman: Treating Alanea Woolridge/Extender: Kristine Royal, Scott A Weeks in Treatment: 5 Active Problems Location of Pain Severity and Description of Pain Patient Has Paino No Site Locations Pain Management and Medication Current Pain Management: Electronic Signature(s) Signed: 12/16/2022 10:56:17 AM By: Worthy Rancher Entered By: Worthy Rancher on 12/16/2022 09:58:56 -------------------------------------------------------------------------------- Patient/Caregiver Education Details Patient Name: Date of Service: [name], [name]. 3/1/2024andnbsp10:00 A M Medical Record Number: [id number] Patient Account Number: [id number] Date of Birth/Gender: Treating RN: 01-25-1956 (67  y.o. Waldron Session Primary Care Physician: Kathyrn Drown Other Clinician: Referring Physician: Treating Physician/Extender: Alean Rinne in Treatment: 5 Education Assessment Education Provided To: Patient Education Topics Provided Wound Debridement: Methods: Explain/Verbal Responses: Reinforcements needed, State content correctly Wound/Skin Impairment: Methods: Explain/Verbal Responses: Reinforcements needed, State content correctly Electronic Signature(s) Signed: 12/16/2022 4:09:38 PM By: Blanche East RN Entered By: Blanche East on 12/16/2022 10:18:01 [name] ([id number]) [id number] Page 6 of 9 -------------------------------------------------------------------------------- Wound Assessment Details Patient Name: Date of Service: [name] 12/16/2022 10:00 A M Medical Record Number: [id number] Patient Account Number: [id number] Date of Birth/Sex: Treating RN: 1955/10/29 (67 y.o. Waldron Session Primary Care Azarias Chiou: Kathyrn Drown Other Clinician: Referring Crytal Pensinger: Treating Floria Brandau/Extender: Kristine Royal, Scott A Weeks in Treatment: 5 Wound Status Wound Number: 1 Primary Diabetic Wound/Ulcer of the Lower Extremity Etiology: Wound Location: Right, Lateral Lower Leg Wound Open Wounding Event: Shear/Friction Status: Date Acquired: 09/16/2022 Comorbid Congestive Heart Failure, Hypertension, Peripheral Venous Weeks Of Treatment: 5 History: Disease, Type II Diabetes, Osteoarthritis, Neuropathy Clustered Wound: No Photos Wound Measurements Length: (cm) 5.4 Width: (cm) 1.2 Depth: (cm) 0.4 Area: (cm) 5.089 Volume: (cm) 2.036 % Reduction in Area: -20% % Reduction in Volume: -60.1% Epithelialization: Small (1-33%) Tunneling: No Undermining: No Wound Description Classification: Grade 1 Exudate Amount: Medium Exudate Type: Serosanguineous Exudate Color: red, brown Wound  Bed Granulation Amount: Large (67-100%) Exposed Structure Granulation Quality: Red Fascia Exposed: No Necrotic Amount: Small (1-33%) Fat Layer (Subcutaneous Tissue) Exposed: Yes Necrotic Quality: Eschar, Adherent Slough Tendon Exposed: No Muscle Exposed: No Joint Exposed: No Bone Exposed: No Periwound Skin Texture Texture Color No Abnormalities Noted: No No Abnormalities Noted: No Scarring: Yes Hemosiderin Staining: Yes Moisture Temperature / Pain No Abnormalities Noted: Yes Temperature: No  Abnormality Treatment Notes Wound #1 (Lower Leg) Wound Laterality: Right, Lateral Cleanser Soap and Water Discharge Instruction: May shower and wash wound with dial antibacterial soap and water prior to dressing change. Wound Cleanser Discharge Instruction: Cleanse the wound with wound cleanser prior to applying a clean dressing using gauze sponges, not tissue or cotton balls. Peri-Wound Care Sween Lotion (Moisturizing lotion) [name], [name] ([id number]) [id number] Page 7 of 9 Discharge Instruction: Apply moisturizing lotion as directed Topical Primary Dressing Sorbalgon AG Dressing 2x2 (in/in) Discharge Instruction: Apply to wound bed as instructed Secondary Dressing Zetuvit Plus 4x8 in Discharge Instruction: Apply over primary dressing as directed. Secured With Transpore Surgical Tape, 2x10 (in/yd) Discharge Instruction: Secure dressing with tape as directed. Compression Wrap ThreePress (3 layer compression wrap) Discharge Instruction: Apply three layer compression as directed. Compression Stockings Add-Ons Electronic Signature(s) Signed: 12/16/2022 4:09:38 PM By: Blanche East RN Entered By: Blanche East on 12/16/2022 10:02:51 -------------------------------------------------------------------------------- Wound Assessment Details Patient Name: Date of Service: [name], [name]. 12/16/2022 10:00 A M Medical Record Number: [id number] Patient Account  Number: [id number] Date of Birth/Sex: Treating RN: 1956/10/07 (67 y.o. Waldron Session Primary Care Breandan People: Kathyrn Drown Other Clinician: Referring Paloma Grange: Treating Xylina Rhoads/Extender: Kristine Royal, Scott A Weeks in Treatment: 5 Wound Status Wound Number: 2 Primary Diabetic Wound/Ulcer of the Lower Extremity Etiology: Wound Location: Right, Lateral Ankle Wound Open Wounding Event: Shear/Friction Status: Date Acquired: 09/16/2022 Comorbid Congestive Heart Failure, Hypertension, Peripheral Venous Weeks Of Treatment: 5 History: Disease, Type II Diabetes, Osteoarthritis, Neuropathy Clustered Wound: No Photos Wound Measurements Length: (cm) 1.5 Width: (cm) 0.4 Depth: (cm) 0.1 Area: (cm) 0.471 Volume: (cm) 0.047 % Reduction in Area: 33.4% % Reduction in Volume: 66.7% Epithelialization: Small (1-33%) Tunneling: No Undermining: No Wound Description Classification: Grade 1 Exudate Amount: Medium Exudate Type: Serous Exudate Color: [address] [name], [name] ([id number]) [id number] Page 8 of 9 Wound Bed Granulation Amount: Large (67-100%) Exposed Structure Granulation Quality: Red Fascia Exposed: No Necrotic Amount: Small (1-33%) Fat Layer (Subcutaneous Tissue) Exposed: Yes Necrotic Quality: Eschar, Adherent Slough Tendon Exposed: No Muscle Exposed: No Joint Exposed: No Bone Exposed: No Periwound Skin Texture Texture Color No Abnormalities Noted: No No Abnormalities Noted: No Scarring: Yes Hemosiderin Staining: Yes Moisture Temperature / Pain No Abnormalities Noted: Yes Temperature: No Abnormality Treatment Notes Wound #2 (Ankle) Wound Laterality: Right, Lateral Cleanser Soap and Water Discharge Instruction: May shower and wash wound with dial antibacterial soap and water prior to dressing change. Wound Cleanser Discharge Instruction: Cleanse the wound with wound cleanser prior to applying a clean dressing using gauze  sponges, not tissue or cotton balls. Peri-Wound Care Sween Lotion (Moisturizing lotion) Discharge Instruction: Apply moisturizing lotion as directed Topical Primary Dressing Sorbalgon AG Dressing 2x2 (in/in) Discharge Instruction: Apply to wound bed as instructed Secondary Dressing Zetuvit Plus 4x8 in Discharge Instruction: Apply over primary dressing as directed. Secured With Transpore Surgical Tape, 2x10 (in/yd) Discharge Instruction: Secure dressing with tape as directed. Compression Wrap ThreePress (3 layer compression wrap) Discharge Instruction: Apply three layer compression as directed. Compression Stockings Add-Ons Electronic Signature(s) Signed: 12/16/2022 4:09:38 PM By: Blanche East RN Entered By: Blanche East on 12/16/2022 10:03:24 -------------------------------------------------------------------------------- Vitals Details Patient Name: Date of Service: [name], [name] 12/16/2022 10:00 A M Medical Record Number: [id number] Patient Account Number: [id number] Date of Birth/Sex: Treating RN: [birth year] (67 y.o. M) Primary Care Sheyli Horwitz: Kathyrn Drown Other Clinician: Referring Edison Nicholson: Treating Cesareo Vickrey/Extender: Kristine Royal, Scott A Weeks in Treatment: 5 Vital Signs Time Taken:  09:57 Temperature (F): 98.4 Height (in): 74 Pulse (bpm): 96 Weight (lbs): 252 Respiratory Rate (breaths/min): 20 [name], [name] ([id number]) [id number] Page 9 of 9 Body Mass Index (BMI): 32.4 Blood Pressure (mmHg): 159/91 Capillary Blood Glucose (mg/dl): 117 Reference Range: 80 - 120 mg / dl Electronic Signature(s) Signed: 12/16/2022 10:56:17 AM By: Worthy Rancher Entered By: Worthy Rancher on 12/16/2022 09:58:43

## 2022-12-20 ENCOUNTER — Ambulatory Visit: Payer: Medicare HMO | Admitting: Internal Medicine

## 2022-12-23 ENCOUNTER — Encounter (HOSPITAL_BASED_OUTPATIENT_CLINIC_OR_DEPARTMENT_OTHER): Payer: Medicare HMO | Admitting: General Surgery

## 2022-12-23 DIAGNOSIS — E11621 Type 2 diabetes mellitus with foot ulcer: Secondary | ICD-10-CM | POA: Diagnosis not present

## 2022-12-23 DIAGNOSIS — I110 Hypertensive heart disease with heart failure: Secondary | ICD-10-CM | POA: Diagnosis not present

## 2022-12-23 DIAGNOSIS — L97312 Non-pressure chronic ulcer of right ankle with fat layer exposed: Secondary | ICD-10-CM | POA: Diagnosis not present

## 2022-12-23 DIAGNOSIS — I2510 Atherosclerotic heart disease of native coronary artery without angina pectoris: Secondary | ICD-10-CM | POA: Diagnosis not present

## 2022-12-23 DIAGNOSIS — L97812 Non-pressure chronic ulcer of other part of right lower leg with fat layer exposed: Secondary | ICD-10-CM | POA: Diagnosis not present

## 2022-12-23 DIAGNOSIS — I429 Cardiomyopathy, unspecified: Secondary | ICD-10-CM | POA: Diagnosis not present

## 2022-12-23 DIAGNOSIS — E11622 Type 2 diabetes mellitus with other skin ulcer: Secondary | ICD-10-CM | POA: Diagnosis not present

## 2022-12-23 DIAGNOSIS — L97512 Non-pressure chronic ulcer of other part of right foot with fat layer exposed: Secondary | ICD-10-CM | POA: Diagnosis not present

## 2022-12-23 DIAGNOSIS — I5022 Chronic systolic (congestive) heart failure: Secondary | ICD-10-CM | POA: Diagnosis not present

## 2022-12-23 DIAGNOSIS — E1151 Type 2 diabetes mellitus with diabetic peripheral angiopathy without gangrene: Secondary | ICD-10-CM | POA: Diagnosis not present

## 2022-12-23 DIAGNOSIS — E785 Hyperlipidemia, unspecified: Secondary | ICD-10-CM | POA: Diagnosis not present

## 2022-12-23 DIAGNOSIS — E1142 Type 2 diabetes mellitus with diabetic polyneuropathy: Secondary | ICD-10-CM | POA: Diagnosis not present

## 2022-12-25 NOTE — Progress Notes (Signed)
[name], [name] [name] ([id number]) [id number] Page 1 of 8 Visit Report for 12/23/2022 Arrival Information Details Patient Name: Date of Service: [name] [name], [name] Langton 12/23/2022 11:00 [name] M Medical Record Number: [id number] Patient Account Number: [id number] Date of Birth/Sex: Treating RN: [birth year] (67 y.o. M) Primary Care [name] [name]: [name] [name] Other Clinician: Referring [name] [name]: Treating [name] [name]/Extender: [name] [name] in Treatment: 6 Visit Information History Since Last Visit All ordered tests and consults were completed: No Patient Arrived: Ambulatory Added or deleted any medications: No Arrival Time: 11:05 Any new allergies or adverse reactions: No Accompanied By: spouse Had [name] fall or experienced change in No Transfer Assistance: None activities of daily living that may affect Patient Identification Verified: Yes risk of falls: Secondary Verification Process Completed: Yes Signs or symptoms of abuse/neglect since last visito No Patient Requires Transmission-Based Precautions: No Hospitalized since last visit: No Patient Has Alerts: Yes Implantable device outside of the clinic excluding No Patient Alerts: ABI RLE [place] cellular tissue based products placed in the center since last visit: Pain Present Now: No Electronic Signature(s) Signed: 12/23/2022 11:24:12 AM By: Worthy [name] Entered By: Worthy [name] on 12/23/2022 11:06:05 -------------------------------------------------------------------------------- Compression Therapy Details Patient Name: Date of Service: [name] [name] 12/23/2022 11:00 [name] M Medical Record Number: [id number] Patient Account Number: [id number] Date of Birth/Sex: Treating RN: 1955-10-25 (67 y.o. [name] Primary Care Myya Meenach: [name] [name] Other Clinician: Referring Shemeka Wardle: Treating [name] [name]/Extender: [name] [name] in Treatment: 6 Compression Therapy Performed for Wound  Assessment: Wound #1 Right,Lateral Lower Leg Performed By: Clinician [name] Catholic, RN Compression Type: Three Layer Post Procedure Diagnosis Same as Pre-procedure Electronic Signature(s) Signed: 12/23/2022 5:21:54 PM By: [name] Catholic RN Entered By: [name] [name] on 12/23/2022 11:41:58 -------------------------------------------------------------------------------- Encounter Discharge Information Details Patient Name: Date of Service: [name] [name], [name] 12/23/2022 11:00 [name] M Medical Record Number: [id number] Patient Account Number: [id number] Date of Birth/Sex: Treating RN: May 05, 1956 (67 y.o. [name] Primary Care [name] [name]: [name] [name] Other Clinician: Referring Tremon Sainvil: Treating [name] [name]/Extender: [name] [name] in Treatment: 6 Encounter Discharge Information Items Post Procedure Vitals Discharge Condition: Stable Temperature (F): 98.7 Ambulatory Status: Ambulatory Pulse (bpm): 99 Discharge Destination: Home Respiratory Rate (breaths/min): 20 Transportation: Private Auto Blood Pressure (mmHg): 117/97 Accompanied By: spouse [name] [name] ([id number]) [id number] Page 2 of 8 Schedule Follow-up Appointment: Yes Clinical Summary of Care: Patient Declined Electronic Signature(s) Signed: 12/23/2022 5:21:54 PM By: [name] Catholic RN Entered By: [name] [name] on 12/23/2022 17:18:54 -------------------------------------------------------------------------------- Lower Extremity Assessment Details Patient Name: Date of Service: [name] [name] 12/23/2022 11:00 [name] M Medical Record Number: [id number] Patient Account Number: [id number] Date of Birth/Sex: Treating RN: 1956-03-13 (67 y.o. [name] Primary Care Jaydalee Bardwell: [name] [name] Other Clinician: Referring [name] [name]: Treating [name] [name]/Extender: [name] [name], [name] [name] in Treatment: 6 Edema Assessment Assessed: [Left: No] [Right:  No] [Left: Edema] [Right: :] Calf Left: Right: Point of Measurement: From Medial Instep 38 cm Ankle Left: Right: Point of Measurement: From Medial Instep 27.5 cm Vascular Assessment Pulses: Dorsalis Pedis Palpable: [Right:Yes] Electronic Signature(s) Signed: 12/23/2022 5:21:54 PM By: [name] Catholic RN Entered By: [name] [name] on 12/23/2022 11:30:30 -------------------------------------------------------------------------------- Multi Wound Chart Details Patient Name: Date of Service: [name] [name], [name] 12/23/2022 11:00 [name] M Medical Record Number: [id number] Patient Account Number: [id number] Date of Birth/Sex: Treating RN: [birth year] (67 y.o. M) Primary Care [name] [name]: [name] [name] Other Clinician: Referring Brandon Scarbrough: Treating [name] [name]/Extender:  [name] [name], [name] [name] in Treatment: 6 Vital Signs Height(in): 74 Capillary Blood Glucose(mg/dl): 156 Weight(lbs): 252 Pulse(bpm): 99 Body Mass Index(BMI): 32.4 Blood Pressure(mmHg): 117/97 Temperature(F): 98.7 Respiratory Rate(breaths/min): 20 [1:Photos:] [N/[name]:N/[name] [id number].pdf Page 3 of 8] Right, Lateral Lower Leg Right, Lateral Ankle N/[name] Wound Location: Shear/Friction Shear/Friction N/[name] Wounding Event: Diabetic Wound/Ulcer of the Lower Diabetic Wound/Ulcer of the Lower N/[name] Primary Etiology: Extremity Extremity Congestive Heart Failure, Congestive Heart Failure, N/[name] Comorbid History: Hypertension, Peripheral Venous Hypertension, Peripheral Venous Disease, Type II Diabetes, Disease, Type II Diabetes, Osteoarthritis, Neuropathy Osteoarthritis, Neuropathy 09/16/2022 09/16/2022 N/[name] Date Acquired: 6 6 N/[name] of Treatment: Open Open N/[name] Wound Status: No No N/[name] Wound Recurrence: 4.8x1.3x0.3 0.3x0.1x0.1 N/[name] Measurements L x W x D (cm) 4.901 0.024 N/[name] [name] (cm) : rea 1.47 0.002 N/[name] Volume (cm) : -15.60% 96.60% N/[name] % Reduction in [name] rea: -15.60% 98.60% N/[name] % Reduction in  Volume: Grade 1 Grade 1 N/[name] Classification: Medium Medium N/[name] Exudate [name] mount: Serosanguineous Serous N/[name] Exudate Type: red, brown amber N/[name] Exudate Color: Large (67-100%) Small (1-33%) N/[name] Granulation [name] mount: Red Red, Pink N/[name] Granulation Quality: Small (1-33%) Large (67-100%) N/[name] Necrotic [name] mount: Eschar, Adherent Slough Adherent Slough N/[name] Necrotic Tissue: Fat Layer (Subcutaneous Tissue): Yes Fat Layer (Subcutaneous Tissue): Yes N/[name] Exposed Structures: Fascia: No Fascia: No Tendon: No Tendon: No Muscle: No Muscle: No Joint: No Joint: No Bone: No Bone: No Small (1-33%) Small (1-33%) N/[name] Epithelialization: Debridement - Selective/Open Wound Debridement - Selective/Open Wound N/[name] Debridement: Pre-procedure Verification/Time Out 11:35 11:35 N/[name] Taken: Lidocaine 4% Topical Solution Lidocaine 4% Topical Solution N/[name] Pain Control: Psychologist, prison and probation services, Slough N/[name] Tissue Debrided: Non-Viable Tissue Non-Viable Tissue N/[name] Level: 6.24 0.03 N/[name] Debridement [name] (sq cm): rea Curette Curette N/[name] Instrument: Minimum Minimum N/[name] Bleeding: Pressure Pressure N/[name] Hemostasis [name] chieved: 0 0 N/[name] Procedural Pain: 0 0 N/[name] Post Procedural Pain: Procedure was tolerated well Procedure was tolerated well N/[name] Debridement Treatment Response: 4.8x1.3x0.3 0.3x0.1x0.1 N/[name] Post Debridement Measurements L x W x D (cm) 1.47 0.002 N/[name] Post Debridement Volume: (cm) Scarring: Yes Scarring: Yes N/[name] Periwound Skin Texture: No Abnormalities Noted No Abnormalities Noted N/[name] Periwound Skin Moisture: Hemosiderin Staining: Yes Hemosiderin Staining: Yes N/[name] Periwound Skin Color: No Abnormality No Abnormality N/[name] Temperature: Compression Therapy Debridement N/[name] Procedures Performed: Debridement Treatment Notes Electronic Signature(s) Signed: 12/23/2022 12:41:15 PM By: [name] Maudlin MD FACS Entered By: [name] Maudlin on 12/23/2022  12:41:15 -------------------------------------------------------------------------------- Multi-Disciplinary Care Plan Details Patient Name: Date of Service: [employer] [name], [name] 12/23/2022 11:00 [name] M Medical Record Number: [id number] Patient Account Number: [id number] Date of Birth/Sex: Treating RN: August 16, 1956 (67 y.o. [name] Primary Care Pearlie Nies: [name] [name] Other Clinician: Referring Kamani Lewter: Treating Aanshi Batchelder/Extender: [name] [name] in Treatment: [place] reviewed with physician [id number] HATCHER, FRITH ([id number]) [id number] Page 4 of 8 Venous Leg Ulcer Nursing Diagnoses: Knowledge deficit related to disease process and management Potential for venous Insuffiency (use before diagnosis confirmed) Goals: Patient will maintain optimal edema control Date Initiated: 11/23/2022 Target Resolution Date: 03/17/2023 Goal Status: Active Interventions: Assess peripheral edema status every visit. Compression as ordered Treatment Activities: Therapeutic compression applied : 11/23/2022 Notes: Wound/Skin Impairment Nursing Diagnoses: Impaired tissue integrity Goals: Patient/caregiver will verbalize understanding of skin care regimen Date Initiated: 11/23/2022 Target Resolution Date: 03/17/2023 Goal Status: Active Ulcer/skin breakdown will have [name] volume reduction of 30% by week 4 Date Initiated: 11/07/2022 Target Resolution Date: 03/17/2023 Goal Status: Active Interventions: Assess patient/caregiver ability to  obtain necessary supplies Assess patient/caregiver ability to perform ulcer/skin care regimen upon admission and as needed Assess ulceration(s) every visit Treatment Activities: Skin care regimen initiated : 11/07/2022 Topical wound management initiated : 11/07/2022 Notes: Electronic Signature(s) Signed: 12/23/2022 5:21:54 PM By: [name] Catholic RN Entered By: [name] [name] on 12/23/2022  17:17:25 -------------------------------------------------------------------------------- Pain Assessment Details Patient Name: Date of Service: Barbera Setters D. 12/23/2022 11:00 [name] M Medical Record Number: [id number] Patient Account Number: [id number] Date of Birth/Sex: Treating RN: May 21, 1956 (67 y.o. M) Primary Care Katelinn Justice: [name] [name] Other Clinician: Referring Sloan Takagi: Treating Rahul Malinak/Extender: [name] [name] in Treatment: 6 Active Problems Location of Pain Severity and Description of Pain Patient Has Paino No Site Locations YAXIEL, [place] ([id number]) [phone].pdf Page 5 of 8 Pain Management and Medication Current Pain Management: Electronic Signature(s) Signed: 12/23/2022 11:24:12 AM By: Worthy [name] Entered By: Worthy [name] on 12/23/2022 11:06:49 -------------------------------------------------------------------------------- Patient/Caregiver Education Details Patient Name: Date of Service: [place] [name], [name] Langton. 3/8/2024andnbsp11:00 [name] M Medical Record Number: [id number] Patient Account Number: [id number] Date of Birth/Gender: Treating RN: October 18, 1955 (67 y.o. [name] Primary Care Physician: [name] [name] Other Clinician: Referring Physician: Treating Physician/Extender: [name] [name] in Treatment: 6 Education Assessment Education Provided To: Patient Education Topics Provided Wound/Skin Impairment: Methods: Explain/Verbal Responses: Return demonstration correctly Electronic Signature(s) Signed: 12/23/2022 5:21:54 PM By: [name] Catholic RN Entered By: [name] [name] on 12/23/2022 17:17:38 -------------------------------------------------------------------------------- Wound Assessment Details Patient Name: Date of Service: Barbera Setters D. 12/23/2022 11:00 [name] M Medical Record Number: [id number] Patient Account Number: [id number] Date of Birth/Sex: Treating RN: [birth year] (67 y.o.  M) Primary Care Yiannis Tulloch: [name] [name] Other Clinician: Referring Sergey Ishler: Treating Phyillis Dascoli/Extender: [name] [name], [name] [name] in Treatment: 6 Wound Status Wound Number: 1 Primary Trauma, Other Etiology: Wound Location: Right, Lateral Lower Leg Wound Open Wounding Event: Shear/Friction Status: Date Acquired: 09/16/2022 Comorbid Congestive Heart Failure, Hypertension, Peripheral Venous [name] Of Treatment: 6 History: Disease, Type II Diabetes, Osteoarthritis, Neuropathy Clustered Wound: No IVAAN, SILERIO ([id number]) [phone].pdf Page 6 of 8 Photos Wound Measurements Length: (cm) 4.8 Width: (cm) 1.3 Depth: (cm) 0.3 Area: (cm) 4.901 Volume: (cm) 1.47 % Reduction in Area: -15.6% % Reduction in Volume: -15.6% Epithelialization: Small (1-33%) Tunneling: No Undermining: No Wound Description Classification: Full Thickness Without Exposed Support Structures Exudate Amount: Medium Exudate Type: Serosanguineous Exudate Color: red, brown Foul Odor After Cleansing: No Slough/Fibrino Yes Wound Bed Granulation Amount: Large (67-100%) Exposed Structure Granulation Quality: Red Fascia Exposed: No Necrotic Amount: Small (1-33%) Fat Layer (Subcutaneous Tissue) Exposed: Yes Necrotic Quality: Eschar, Adherent Slough Tendon Exposed: No Muscle Exposed: No Joint Exposed: No Bone Exposed: No Periwound Skin Texture Texture Color No Abnormalities Noted: No No Abnormalities Noted: No Scarring: Yes Hemosiderin Staining: Yes Moisture Temperature / Pain No Abnormalities Noted: Yes Temperature: No Abnormality Treatment Notes Wound #1 (Lower Leg) Wound Laterality: Right, Lateral Cleanser Soap and Water Discharge Instruction: May shower and wash wound with dial antibacterial soap and water prior to dressing change. Wound Cleanser Discharge Instruction: Cleanse the wound with wound cleanser prior to applying [name] clean dressing using gauze  sponges, not tissue or cotton balls. Peri-Wound Care Sween Lotion (Moisturizing lotion) Discharge Instruction: Apply moisturizing lotion as directed Topical Primary Dressing Sorbalgon AG Dressing 2x2 (in/in) Discharge Instruction: Apply to wound bed as instructed Secondary Dressing Zetuvit Plus 4x8 in Discharge Instruction: Apply over primary dressing as directed. Secured With [employer] Surgical Tape, 2x10 (in/yd) Discharge Instruction: Secure dressing  with tape as directed. Compression ARKHAM, BORROMEO ([id number]) [id number] Page 7 of 8 ThreePress (3 layer compression wrap) Discharge Instruction: Apply three layer compression as directed. Compression Stockings Add-Ons Electronic Signature(s) Signed: 12/26/2022 5:54:15 PM By: [name] Catholic RN Previous Signature: 12/23/2022 5:21:54 PM Version By: [name] Catholic RN Previous Signature: 12/23/2022 11:24:12 AM Version By: Worthy [name] Entered By: [name] [name] on 12/26/2022 09:34:06 -------------------------------------------------------------------------------- Wound Assessment Details Patient Name: Date of Service: [place] [name], [name] 12/23/2022 11:00 [name] M Medical Record Number: [id number] Patient Account Number: [id number] Date of Birth/Sex: Treating RN: 10/15/1956 (67 y.o. M) Primary Care Ionna Avis: [name] [name] Other Clinician: Referring Fordyce Lepak: Treating Xavior Niazi/Extender: [name] [name], [name] [name] in Treatment: 6 Wound Status Wound Number: 2 Primary Trauma, Other Etiology: Wound Location: Right, Lateral Ankle Wound Open Wounding Event: Shear/Friction Status: Date Acquired: 09/16/2022 Comorbid Congestive Heart Failure, Hypertension, Peripheral Venous [name] Of Treatment: 6 History: Disease, Type II Diabetes, Osteoarthritis, Neuropathy Clustered Wound: No Photos Wound Measurements Length: (cm) 0.3 Width: (cm) 0.1 Depth: (cm) 0.1 Area: (cm) 0.024 Volume: (cm) 0.002 %  Reduction in Area: 96.6% % Reduction in Volume: 98.6% Epithelialization: Small (1-33%) Tunneling: No Undermining: No Wound Description Classification: Full Thickness Without Exposed Support Structures Exudate Amount: Medium Exudate Type: Serous Exudate Color: amber Foul Odor After Cleansing: No Slough/Fibrino Yes Wound Bed Granulation Amount: Small (1-33%) Exposed Structure Granulation Quality: Red, Pink Fascia Exposed: No Necrotic Amount: Large (67-100%) Fat Layer (Subcutaneous Tissue) Exposed: Yes Necrotic Quality: Adherent Slough Tendon Exposed: No Muscle Exposed: No Joint Exposed: No Bone Exposed: No Periwound Skin Texture Texture Color No Abnormalities Noted: No No Abnormalities Noted: No Scarring: Yes Hemosiderin Staining: Yes Moisture Temperature / Pain KENDRICK, LEDYARD ([id number]) [id number] Page 8 of 8 No Abnormalities Noted: Yes Temperature: No Abnormality Treatment Notes Wound #2 (Ankle) Wound Laterality: Right, Lateral Cleanser Soap and Water Discharge Instruction: May shower and wash wound with dial antibacterial soap and water prior to dressing change. Wound Cleanser Discharge Instruction: Cleanse the wound with wound cleanser prior to applying [name] clean dressing using gauze sponges, not tissue or cotton balls. Peri-Wound Care Sween Lotion (Moisturizing lotion) Discharge Instruction: Apply moisturizing lotion as directed Topical Primary Dressing Sorbalgon AG Dressing 2x2 (in/in) Discharge Instruction: Apply to wound bed as instructed Secondary Dressing Zetuvit Plus 4x8 in Discharge Instruction: Apply over primary dressing as directed. Secured With [employer] Surgical Tape, 2x10 (in/yd) Discharge Instruction: Secure dressing with tape as directed. Compression Wrap ThreePress (3 layer compression wrap) Discharge Instruction: Apply three layer compression as directed. Compression Stockings Add-Ons Electronic  Signature(s) Signed: 12/26/2022 5:54:15 PM By: [name] Catholic RN Previous Signature: 12/23/2022 5:21:54 PM Version By: [name] Catholic RN Previous Signature: 12/23/2022 11:24:12 AM Version By: Worthy [name] Entered By: [name] [name] on 12/26/2022 09:34:54 -------------------------------------------------------------------------------- Vitals Details Patient Name: Date of Service: [place] [name], [name] 12/23/2022 11:00 [name] M Medical Record Number: [id number] Patient Account Number: [id number] Date of Birth/Sex: Treating RN: 1955-10-31 (67 y.o. M) Primary Care Jarita Raval: [name] [name] Other Clinician: Referring Cherrise Occhipinti: Treating Leoda Smithhart/Extender: [name] [name], [name] [name] in Treatment: 6 Vital Signs Time Taken: 11:06 Temperature (F): 98.7 Height (in): 74 Pulse (bpm): 99 Weight (lbs): 252 Respiratory Rate (breaths/min): 20 Body Mass Index (BMI): 32.4 Blood Pressure (mmHg): 117/97 Capillary Blood Glucose (mg/dl): 156 Reference Range: 80 - 120 mg / dl Electronic Signature(s) Signed: 12/23/2022 11:24:12 AM By: Worthy [name] Entered By: Worthy [name] on 12/23/2022 11:06:38

## 2022-12-25 NOTE — Progress Notes (Signed)
[name], [name] ([id number]) [id number] Page 1 of 10 Visit Report for 12/23/2022 Chief Complaint Document Details Patient Name: Date of Service: [name] 12/23/2022 11:00 A M Medical Record Number: [id number] Patient Account Number: [id number] Date of Birth/Sex: Treating RN: 1955-11-03 (67 y.o. M) Primary Care Provider: Kathyrn Drown Other Clinician: Referring Provider: Treating Provider/Extender: Alean Rinne in Treatment: 6 Information Obtained from: Patient Chief Complaint Patients presents for treatment of an open diabetic ulcer on his right foot, as well as ulcers on his right lower leg and ankle Electronic Signature(s) Signed: 12/23/2022 12:41:28 PM By: [name] MD FACS Entered By: [name] on 12/23/2022 12:41:28 -------------------------------------------------------------------------------- Debridement Details Patient Name: Date of Service: [name], [name] [name] D. 12/23/2022 11:00 A M Medical Record Number: [id number] Patient Account Number: [id number] Date of Birth/Sex: Treating RN: 06/25/1956 (67 y.o. [name] Primary Care Provider: Kathyrn Drown Other Clinician: Referring Provider: Treating Provider/Extender: Alean Rinne in Treatment: 6 Debridement Performed for Assessment: Wound #2 Right,Lateral Ankle Performed By: Physician [name], MD Debridement Type: Debridement Severity of Tissue Pre Debridement: Fat layer exposed Level of Consciousness (Pre-procedure): Awake and Alert Pre-procedure Verification/Time Out Yes - 11:35 Taken: Start Time: 11:35 Pain Control: Lidocaine 4% T opical Solution T Area Debrided (L x W): otal 0.3 (cm) x 0.1 (cm) = 0.03 (cm) Tissue and other material debrided: Non-Viable, Eschar, Slough, Slough Level: Non-Viable Tissue Debridement Description: Selective/Open Wound Instrument: Curette Bleeding: Minimum Hemostasis  Achieved: Pressure End Time: 11:36 Procedural Pain: 0 Post Procedural Pain: 0 Response to Treatment: Procedure was tolerated well Level of Consciousness (Post- Awake and Alert procedure): Post Debridement Measurements of Total Wound Length: (cm) 0.3 Width: (cm) 0.1 Depth: (cm) 0.1 Volume: (cm) 0.002 Character of Wound/Ulcer Post Debridement: Improved Severity of Tissue Post Debridement: Fat layer exposed Post Procedure Diagnosis Same as Pre-procedure Notes Scribed for Dr. Celine Ahr by J.Scotton Electronic Signature(s) [name] ([id number]) [phone].pdf Page 2 of 10 Signed: 12/23/2022 2:25:58 PM By: [name] MD FACS Signed: 12/23/2022 5:21:54 PM By: Dellie Catholic RN Entered By: Dellie Catholic on 12/23/2022 11:40:11 -------------------------------------------------------------------------------- Debridement Details Patient Name: Date of Service: [name], [name] [name] D. 12/23/2022 11:00 A M Medical Record Number: [id number] Patient Account Number: [id number] Date of Birth/Sex: Treating RN: 02-15-1956 (67 y.o. [name] Primary Care Provider: Kathyrn Drown Other Clinician: Referring Provider: Treating Provider/Extender: Alean Rinne in Treatment: 6 Debridement Performed for Assessment: Wound #1 Right,Lateral Lower Leg Performed By: Physician [name], MD Debridement Type: Debridement Severity of Tissue Pre Debridement: Fat layer exposed Level of Consciousness (Pre-procedure): Awake and Alert Pre-procedure Verification/Time Out Yes - 11:35 Taken: Start Time: 11:35 Pain Control: Lidocaine 4% T opical Solution T Area Debrided (L x W): otal 4.8 (cm) x 1.3 (cm) = 6.24 (cm) Tissue and other material debrided: Non-Viable, Slough, Slough Level: Non-Viable Tissue Debridement Description: Selective/Open Wound Instrument: Curette Bleeding: Minimum Hemostasis Achieved: Pressure End Time: 11:36 Procedural  Pain: 0 Post Procedural Pain: 0 Response to Treatment: Procedure was tolerated well Level of Consciousness (Post- Awake and Alert procedure): Post Debridement Measurements of Total Wound Length: (cm) 4.8 Width: (cm) 1.3 Depth: (cm) 0.3 Volume: (cm) 1.47 Character of Wound/Ulcer Post Debridement: Improved Severity of Tissue Post Debridement: Fat layer exposed Post Procedure Diagnosis Same as Pre-procedure Notes Scribed for Dr. Celine Ahr by J.Scotton Electronic Signature(s) Signed: 12/23/2022 2:25:58 PM By: [name] MD FACS Signed: 12/23/2022 5:21:54 PM By: Dellie Catholic  RN Entered By: Dellie Catholic on 12/23/2022 11:41:31 -------------------------------------------------------------------------------- HPI Details Patient Name: Date of Service: [name], [name] 12/23/2022 11:00 A M Medical Record Number: [id number] Patient Account Number: [id number] Date of Birth/Sex: Treating RN: Mar 10, 1956 (67 y.o. 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 He has a remote history of surgery on his right ankle and apparently as his legs have become increasingly more swollen and he has insisted upon wearing boots, the pressure and friction seem to have opened up wounds along the old scar line. In addition, he has a large callus on the third metatarsal head, related to his acquired foot deformity. He sees a podiatrist who  periodically shaves this callus down and he was also recommended orthotics, but the patient has refused them because he likes his boots. There is an area of discoloration in the lower portion of this callus, suspicious for an open wound. 11/14/2022: The ulcer on his third metatarsal head has closed. The lateral leg ulcers are both about the same size, but he says that they feel better. There is slough accumulation in both sites. 11/23/2022: The ulcer on his foot remains closed. The lateral leg ulcers are still unchanged in size, but are a bit cleaner, just with some slough accumulation. 12/01/2022: No significant change to the size of the wounds on his lateral leg. The larger is quite clean with good granulation tissue present. The smaller still has fairly thick slough buildup. 12/09/2022: Both wounds are smaller today. They still have slough accumulation, but less so than at his previous visit. The callus on his foot has built up again and is uncomfortable. 12/16/2022: The wound at his ankle is nearly closed with just a little slough on the remaining open surface. The lateral leg wound is about the same size but has less slough accumulation. We are still working on Biochemist, clinical for wound VAC. 12/23/2022: The wound is ankle is down to just a pinhole with a small amount of slough and eschar present. The lateral leg wound has contracted in length and is also shallower. There is slough on the surface. We still have not secured insurance approval for his wound VAC. Electronic Signature(s) Signed: 12/23/2022 12:57:06 PM By: [name] MD FACS Previous Signature: 12/23/2022 12:42:10 PM Version By: [name] MD FACS Entered By: [name] on 12/23/2022 12:57:06 -------------------------------------------------------------------------------- Physical Exam Details Patient Name: Date of Service: [name], [name] [name] D. 12/23/2022 11:00 A M Medical Record Number: [id number] Patient Account Number:  [id number] Date of Birth/Sex: Treating RN: 1955-12-17 (67 y.o. M) Primary Care Provider: Kathyrn Drown Other Clinician: Referring Provider: Treating Provider/Extender: Kristine Royal, Scott A Weeks in Treatment: 6 Constitutional . . . . no acute distress. Respiratory Normal work of breathing on room air. Notes 12/23/2022: The wound is ankle is down to just a pinhole with a small amount of slough and eschar present. The lateral leg wound has contracted in length and is also shallower. There is slough on the surface. Electronic Signature(s) Signed: 12/23/2022 1:07:42 PM By: [name] MD FACS Previous  Signature: 12/23/2022 12:57:42 PM Version By: [name] MD FACS Previous Signature: 12/23/2022 12:55:36 PM Version By: [name] MD FACS Entered By: [name] on 12/23/2022 13:07:42 -------------------------------------------------------------------------------- Physician Orders Details Patient Name: Date of Service: [name], [name] [name] D. 12/23/2022 11:00 A M Medical Record Number: [id number] Patient Account Number: [id number] Date of Birth/Sex: Treating RN: 1956/01/20 (67 y.o. [name] Primary Care Provider: Kathyrn Drown Other Clinician: Referring Provider: Treating Provider/Extender: Alean Rinne in Treatment: 6 Verbal / Phone Orders: No Diagnosis Coding ICD-10 Coding Code Description L97.819 Non-pressure chronic ulcer of other part of right lower leg with unspecified severity L97.312 Non-pressure chronic ulcer of right ankle with fat layer exposed E11.622 Type 2 diabetes mellitus with other skin ulcer [id number] Chronic systolic (congestive) heart failure KAHLEL, VANNUCCI ([id number]) [id number] Page 4 of 10 I73.9 Peripheral vascular disease, unspecified E11.42 Type 2 diabetes mellitus with diabetic polyneuropathy I63.9 Cerebral infarction, unspecified I10 Essential (primary)  hypertension G89.4 Chronic pain syndrome L84 Corns and callosities Follow-up Appointments ppointment in 2 weeks. - Dr. Celine Ahr Room 3 Return A Nurse Visit: - Next week Anesthetic Wound #1 Right,Lateral Lower Leg (In clinic) Topical Lidocaine 4% applied to wound bed Wound #2 Right,Lateral Ankle (In clinic) Topical Lidocaine 4% applied to wound bed Bathing/ Shower/ Hygiene May shower with protection but do not get wound dressing(s) wet. Protect dressing(s) with water repellant cover (for example, large plastic bag) or a cast cover and may then take shower. - May purchase cast protector at [facility], [facility], or [facility] Negative Presssure Wound Therapy Wound #1 Right,Lateral Lower Leg Wound Vac to wound continuously at 112m/hg pressure - RUN IVR WOUND VAC for Right Lateral leg Black Foam Edema Control - Lymphedema / SCD / Other Right Lower Extremity Elevate legs to the level of the heart or above for 30 minutes daily and/or when sitting for 3-4 times a day throughout the day. Avoid standing for long periods of time. Wound Treatment Wound #1 - Lower Leg Wound Laterality: Right, Lateral Cleanser: Soap and Water 1 x Per Week/30 Days Discharge Instructions: May shower and wash wound with dial antibacterial soap and water prior to dressing change. Cleanser: Wound Cleanser 1 x Per Week/30 Days Discharge Instructions: Cleanse the wound with wound cleanser prior to applying a clean dressing using gauze sponges, not tissue or cotton balls. Peri-Wound Care: Sween Lotion (Moisturizing lotion) 1 x Per Week/30 Days Discharge Instructions: Apply moisturizing lotion as directed Prim Dressing: Sorbalgon AG Dressing 2x2 (in/in) 1 x Per Week/30 Days ary Discharge Instructions: Apply to wound bed as instructed Secondary Dressing: Zetuvit Plus 4x8 in 1 x Per Week/30 Days Discharge Instructions: Apply over primary dressing as directed. Secured With: Transpore Surgical Tape, 2x10 (in/yd) 1 x Per Week/30  Days Discharge Instructions: Secure dressing with tape as directed. Compression Wrap: ThreePress (3 layer compression wrap) 1 x Per Week/30 Days Discharge Instructions: Apply three layer compression as directed. Wound #2 - Ankle Wound Laterality: Right, Lateral Cleanser: Soap and Water 1 x Per Week/30 Days Discharge Instructions: May shower and wash wound with dial antibacterial soap and water prior to dressing change. Cleanser: Wound Cleanser 1 x Per Week/30 Days Discharge Instructions: Cleanse the wound with wound cleanser prior to applying a clean dressing using gauze sponges, not tissue or cotton balls. Peri-Wound Care: Sween Lotion (Moisturizing lotion) 1 x Per Week/30 Days Discharge Instructions: Apply moisturizing lotion as directed Prim Dressing: Sorbalgon AG Dressing 2x2 (in/in) 1 x Per Week/30 Days ary Discharge Instructions:  Apply to wound bed as instructed Secondary Dressing: Zetuvit Plus 4x8 in 1 x Per Week/30 Days Discharge Instructions: Apply over primary dressing as directed. Secured With: Transpore Surgical Tape, 2x10 (in/yd) 1 x Per Week/30 Days Discharge Instructions: Secure dressing with tape as directed. Compression Wrap: ThreePress (3 layer compression wrap) 1 x Per Week/30 Days SHAQUILE, DUSEL ([id number]) [phone].pdf Page 5 of 10 Discharge Instructions: Apply three layer compression as directed. Electronic Signature(s) Signed: 12/23/2022 2:25:58 PM By: [name] MD FACS Entered By: [name] on 12/23/2022 13:08:16 -------------------------------------------------------------------------------- Problem List Details Patient Name: Date of Service: [name], [name] [name] D. 12/23/2022 11:00 A M Medical Record Number: [id number] Patient Account Number: [id number] Date of Birth/Sex: Treating RN: Aug 27, 1956 (67 y.o. M) Primary Care Provider: Kathyrn Drown Other Clinician: Referring Provider: Treating Provider/Extender: Alean Rinne in Treatment: 6 Active Problems ICD-10 Encounter Code Description Active Date MDM Diagnosis L97.819 Non-pressure chronic ulcer of other part of right lower leg with unspecified 11/07/2022 No Yes severity L97.312 Non-pressure chronic ulcer of right ankle with fat layer exposed 11/07/2022 No Yes E11.622 Type 2 diabetes mellitus with other skin ulcer 11/07/2022 No Yes [id number] Chronic systolic (congestive) heart failure 11/07/2022 No Yes I73.9 Peripheral vascular disease, unspecified 11/07/2022 No Yes E11.42 Type 2 diabetes mellitus with diabetic polyneuropathy 11/07/2022 No Yes I63.9 Cerebral infarction, unspecified 11/07/2022 No Yes I10 Essential (primary) hypertension 11/07/2022 No Yes G89.4 Chronic pain syndrome 11/07/2022 No Yes L84 Corns and callosities 12/09/2022 No Yes Inactive Problems ICD-10 Code Description Active Date Inactive Date L97.512 Non-pressure chronic ulcer of other part of right foot with fat layer exposed 11/07/2022 11/07/2022 [name] ([id number]) [phone].pdf Page 6 of 10 E11.621 Type 2 diabetes mellitus with foot ulcer 11/07/2022 11/07/2022 Resolved Problems Electronic Signature(s) Signed: 12/23/2022 12:40:59 PM By: [name] MD FACS Entered By: [name] on 12/23/2022 12:40:59 -------------------------------------------------------------------------------- Progress Note Details Patient Name: Date of Service: [name], [name] [name] D. 12/23/2022 11:00 A M Medical Record Number: [id number] Patient Account Number: [id number] Date of Birth/Sex: Treating RN: 1956/04/03 (67 y.o. M) Primary Care Provider: Kathyrn Drown Other Clinician: Referring Provider: Treating Provider/Extender: Alean Rinne in Treatment: 6 Subjective Chief Complaint Information obtained from Patient Patients presents for treatment of an open diabetic ulcer on his right foot, as well as ulcers on his right  lower leg and ankle History of Present Illness (HPI) ADMISSION 11/07/2022 This is a 67 year old man with a past medical history significant for type 2 diabetes (last hemoglobin A1c 7.1), cerebrovascular occlusive disease, coronary artery disease, peripheral vascular disease, congestive heart failure, chronic pain syndrome, and prior amputation of his right great and second toes. He has a remote history of surgery on his right ankle and apparently as his legs have become increasingly more swollen and he has insisted upon wearing boots, the pressure and friction seem to have opened up wounds along the old scar line. In addition, he has a large callus on the third metatarsal head, related to his acquired foot deformity. He sees a podiatrist who periodically shaves this callus down and he was also recommended orthotics, but the patient has refused them because he likes his boots. There is an area of discoloration in the lower portion of this callus, suspicious for an open wound. 11/14/2022: The ulcer on his third metatarsal head has closed. The lateral leg ulcers are both about the same size, but he says that they feel better. There is slough accumulation in both  sites. 11/23/2022: The ulcer on his foot remains closed. The lateral leg ulcers are still unchanged in size, but are a bit cleaner, just with some slough accumulation. 12/01/2022: No significant change to the size of the wounds on his lateral leg. The larger is quite clean with good granulation tissue present. The smaller still has fairly thick slough buildup. 12/09/2022: Both wounds are smaller today. They still have slough accumulation, but less so than at his previous visit. The callus on his foot has built up again and is uncomfortable. 12/16/2022: The wound at his ankle is nearly closed with just a little slough on the remaining open surface. The lateral leg wound is about the same size but has less slough accumulation. We are still working on  Biochemist, clinical for wound VAC. 12/23/2022: The wound is ankle is down to just a pinhole with a small amount of slough and eschar present. The lateral leg wound has contracted in length and is also shallower. There is slough on the surface. We still have not secured insurance approval for his wound VAC. Patient History Family History Cancer - Father,Mother,Siblings, Diabetes - Mother,Siblings, Heart Disease, Lung Disease - Father, No family history of Hereditary Spherocytosis, Hypertension, Kidney Disease, Seizures, Stroke, Thyroid Problems, Tuberculosis. Social History Former smoker - ended on 12/23/1970, Marital Status - Married, Alcohol Use - Never, Drug Use - No History, Caffeine Use - Daily. Medical History Eyes Denies history of Cataracts, Glaucoma, Optic Neuritis Ear/Nose/Mouth/Throat Denies history of Chronic sinus problems/congestion, Middle ear problems Respiratory Denies history of Aspiration, Asthma, Chronic Obstructive Pulmonary Disease (COPD), Pneumothorax, Sleep Apnea, Tuberculosis Cardiovascular Patient has history of Congestive Heart Failure, Hypertension, Peripheral Venous Disease Endocrine Patient has history of Type II Diabetes Musculoskeletal Patient has history of Osteoarthritis Neurologic Patient has history of Neuropathy Oncologic Denies history of Received Chemotherapy, Received Radiation Psychiatric Denies history of [facility] [place] [address] SOHUM, KLINGERMAN ([id number]) [id number] Page 7 of 10 Medical A Surgical History Notes nd Cardiovascular cardiomyopathy hyperlipidemia hypokalemia stroke Genitourinary orchitis Objective Constitutional no acute distress. Vitals Time Taken: 11:06 AM, Height: 74 in, Weight: 252 lbs, BMI: 32.4, Temperature: 98.7 F, Pulse: 99 bpm, Respiratory Rate: 20 breaths/min, Blood Pressure: 117/97 mmHg, Capillary Blood Glucose: 156 mg/dl. Respiratory Normal work of breathing on room  air. General Notes: 12/23/2022: The wound is ankle is down to just a pinhole with a small amount of slough and eschar present. The lateral leg wound has contracted in length and is also shallower. There is slough on the surface. Integumentary (Hair, Skin) Wound #1 status is Open. Original cause of wound was Shear/Friction. The date acquired was: 09/16/2022. The wound has been in treatment 6 weeks. The wound is located on the Right,Lateral Lower Leg. The wound measures 4.8cm length x 1.3cm width x 0.3cm depth; 4.901cm^2 area and 1.47cm^3 volume. There is Fat Layer (Subcutaneous Tissue) exposed. There is no tunneling or undermining noted. There is a medium amount of serosanguineous drainage noted. There is large (67-100%) red granulation within the wound bed. There is a small (1-33%) amount of necrotic tissue within the wound bed including Eschar and Adherent Slough. The periwound skin appearance had no abnormalities noted for moisture. The periwound skin appearance exhibited: Scarring, Hemosiderin Staining. Periwound temperature was noted as No Abnormality. Wound #2 status is Open. Original cause of wound was Shear/Friction. The date acquired was: 09/16/2022. The wound has been in treatment 6 weeks. The wound is located on the Right,Lateral Ankle. The wound measures 0.3cm length x 0.1cm width x 0.1cm depth;  0.024cm^2 area and 0.002cm^3 volume. There is Fat Layer (Subcutaneous Tissue) exposed. There is no tunneling or undermining noted. There is a medium amount of serous drainage noted. There is small (1- 33%) red, pink granulation within the wound bed. There is a large (67-100%) amount of necrotic tissue within the wound bed including Adherent Slough. The periwound skin appearance had no abnormalities noted for moisture. The periwound skin appearance exhibited: Scarring, Hemosiderin Staining. Periwound temperature was noted as No Abnormality. Assessment Active Problems ICD-10 Non-pressure chronic ulcer  of other part of right lower leg with unspecified severity Non-pressure chronic ulcer of right ankle with fat layer exposed Type 2 diabetes mellitus with other skin ulcer Chronic systolic (congestive) heart failure Peripheral vascular disease, unspecified Type 2 diabetes mellitus with diabetic polyneuropathy Cerebral infarction, unspecified Essential (primary) hypertension Chronic pain syndrome Corns and callosities Procedures Wound #1 Pre-procedure diagnosis of Wound #1 is a Diabetic Wound/Ulcer of the Lower Extremity located on the Right,Lateral Lower Leg .Severity of Tissue Pre Debridement is: Fat layer exposed. There was a Selective/Open Wound Non-Viable Tissue Debridement with a total area of 6.24 sq cm performed by [name], MD. With the following instrument(s): Curette to remove Non-Viable tissue/material. Material removed includes [facility] after achieving pain control using Lidocaine 4% T opical Solution. No specimens were taken. A time out was conducted at 11:35, prior to the start of the procedure. A Minimum amount of bleeding was controlled with Pressure. The procedure was tolerated well with a pain level of 0 throughout and a pain level of 0 following the procedure. Post Debridement Measurements: 4.8cm length x 1.3cm width x 0.3cm depth; 1.47cm^3 volume. Character of Wound/Ulcer Post Debridement is improved. Severity of Tissue Post Debridement is: Fat layer exposed. Post procedure Diagnosis Wound #1: Same as Pre-Procedure General Notes: Scribed for Dr. Celine Ahr by J.Scotton. Pre-procedure diagnosis of Wound #1 is a Diabetic Wound/Ulcer of the Lower Extremity located on the Right,Lateral Lower Leg . There was a Three Layer Compression Therapy Procedure by Dellie Catholic, RN. Post procedure Diagnosis Wound #1: Same as Pre-Procedure JAZARION, MCMANNIS ([id number]) [phone].pdf Page 8 of 10 Wound #2 Pre-procedure diagnosis of Wound #2 is a Diabetic  Wound/Ulcer of the Lower Extremity located on the Right,Lateral Ankle .Severity of Tissue Pre Debridement is: Fat layer exposed. There was a Selective/Open Wound Non-Viable Tissue Debridement with a total area of 0.03 sq cm performed by [name], MD. With the following instrument(s): Curette to remove Non-Viable tissue/material. Material removed includes Eschar and Slough and after achieving pain control using Lidocaine 4% T opical Solution. No specimens were taken. A time out was conducted at 11:35, prior to the start of the procedure. A Minimum amount of bleeding was controlled with Pressure. The procedure was tolerated well with a pain level of 0 throughout and a pain level of 0 following the procedure. Post Debridement Measurements: 0.3cm length x 0.1cm width x 0.1cm depth; 0.002cm^3 volume. Character of Wound/Ulcer Post Debridement is improved. Severity of Tissue Post Debridement is: Fat layer exposed. Post procedure Diagnosis Wound #2: Same as Pre-Procedure General Notes: Scribed for Dr. Celine Ahr by J.Scotton. Plan Follow-up Appointments: Return Appointment in 2 weeks. - Dr. Celine Ahr Room 3 Nurse Visit: - Next week Anesthetic: Wound #1 Right,Lateral Lower Leg: (In clinic) Topical Lidocaine 4% applied to wound bed Wound #2 Right,Lateral Ankle: (In clinic) Topical Lidocaine 4% applied to wound bed Bathing/ Shower/ Hygiene: May shower with protection but do not get wound dressing(s) wet. Protect dressing(s) with water repellant cover (for example,  large plastic bag) or a cast cover and may then take shower. - May purchase cast protector at [facility], [facility], or [facility] Negative Presssure Wound Therapy: Wound #1 Right,Lateral Lower Leg: Wound Vac to wound continuously at 139m/hg pressure - RUN IVR WOUND VAC for Right Lateral leg Black Foam Edema Control - Lymphedema / SCD / Other: Elevate legs to the level of the heart or above for 30 minutes daily and/or when sitting for 3-4 times a  day throughout the day. Avoid standing for long periods of time. WOUND #1: - Lower Leg Wound Laterality: Right, Lateral Cleanser: Soap and Water 1 x Per Week/30 Days Discharge Instructions: May shower and wash wound with dial antibacterial soap and water prior to dressing change. Cleanser: Wound Cleanser 1 x Per Week/30 Days Discharge Instructions: Cleanse the wound with wound cleanser prior to applying a clean dressing using gauze sponges, not tissue or cotton balls. Peri-Wound Care: Sween Lotion (Moisturizing lotion) 1 x Per Week/30 Days Discharge Instructions: Apply moisturizing lotion as directed Prim Dressing: Sorbalgon AG Dressing 2x2 (in/in) 1 x Per Week/30 Days ary Discharge Instructions: Apply to wound bed as instructed Secondary Dressing: Zetuvit Plus 4x8 in 1 x Per Week/30 Days Discharge Instructions: Apply over primary dressing as directed. Secured With: Transpore Surgical T ape, 2x10 (in/yd) 1 x Per Week/30 Days Discharge Instructions: Secure dressing with tape as directed. Com pression Wrap: ThreePress (3 layer compression wrap) 1 x Per Week/30 Days Discharge Instructions: Apply three layer compression as directed. WOUND #2: - Ankle Wound Laterality: Right, Lateral Cleanser: Soap and Water 1 x Per Week/30 Days Discharge Instructions: May shower and wash wound with dial antibacterial soap and water prior to dressing change. Cleanser: Wound Cleanser 1 x Per Week/30 Days Discharge Instructions: Cleanse the wound with wound cleanser prior to applying a clean dressing using gauze sponges, not tissue or cotton balls. Peri-Wound Care: Sween Lotion (Moisturizing lotion) 1 x Per Week/30 Days Discharge Instructions: Apply moisturizing lotion as directed Prim Dressing: Sorbalgon AG Dressing 2x2 (in/in) 1 x Per Week/30 Days ary Discharge Instructions: Apply to wound bed as instructed Secondary Dressing: Zetuvit Plus 4x8 in 1 x Per Week/30 Days Discharge Instructions: Apply over primary  dressing as directed. Secured With: Transpore Surgical T ape, 2x10 (in/yd) 1 x Per Week/30 Days Discharge Instructions: Secure dressing with tape as directed. Com pression Wrap: ThreePress (3 layer compression wrap) 1 x Per Week/30 Days Discharge Instructions: Apply three layer compression as directed. 12/23/2022: The wound is ankle is down to just a pinhole with a small amount of slough and eschar present. The lateral leg wound has contracted in length and is also shallower. There is slough on the surface. I used a curette to debride slough and eschar from the ankle wound and slough from the lateral leg wound. We will continue to use silver alginate and 3 layer compression to both sites. We will continue to work on obtaining a wound VAC for him. Due to limited provider availability next week, he will just have a nurse visit for a dressing change and he will follow-up with me in 2 weeks. Electronic Signature(s) Signed: 12/23/2022 1:09:30 PM By: CFredirick MaudlinMD FACS Entered By: CFredirick Maudlinon 12/23/2022 13:09:30 WEdwyna Shell([id number] Page 9 of 10 -------------------------------------------------------------------------------- HxROS Details Patient Name: Date of Service: WBurnadette Peter3/05/2023 11:00 A M Medical Record Number: [id number] Account Number: [id number] of Birth/Sex: Treating RN: [birth year](67y.o. M) Primary Care Provider: LKathyrn DrownOther  Clinician: Referring Provider: Treating Provider/Extender: Alean Rinne in Treatment: 6 Eyes Medical History: Negative for: Cataracts; Glaucoma; Optic Neuritis Ear/Nose/Mouth/Throat Medical History: Negative for: Chronic sinus problems/congestion; Middle ear problems Respiratory Medical History: Negative for: Aspiration; Asthma; Chronic Obstructive Pulmonary Disease (COPD); Pneumothorax; Sleep Apnea; Tuberculosis Cardiovascular Medical  History: Positive for: Congestive Heart Failure; Hypertension; Peripheral Venous Disease Past Medical History Notes: cardiomyopathy hyperlipidemia hypokalemia stroke Endocrine Medical History: Positive for: Type II Diabetes Time with diabetes: 20 years Treated with: Insulin, Oral agents Genitourinary Medical History: Past Medical History Notes: orchitis Musculoskeletal Medical History: Positive for: Osteoarthritis Neurologic Medical History: Positive for: Neuropathy Oncologic Medical History: Negative for: Received Chemotherapy; Received Radiation Psychiatric Medical History: Negative for: Anorexia/bulimia; Confinement Anxiety Immunizations Pneumococcal Vaccine: Received Pneumococcal Vaccination: Yes Received Pneumococcal Vaccination On or After 60th Birthday: Yes Implantable Devices No devices added Family and Social History MYCHAEL, HOLROYD ([id number]) [phone].pdf Page 10 of 10 Cancer: Yes - Father,Mother,Siblings; Diabetes: Yes - Mother,Siblings; Heart Disease: Yes; Hereditary Spherocytosis: No; Hypertension: No; Kidney Disease: No; Lung Disease: Yes - Father; Seizures: No; Stroke: No; Thyroid Problems: No; Tuberculosis: No; Former smoker - ended on 12/23/1970; Marital Status - Married; Alcohol Use: Never; Drug Use: No History; Caffeine Use: Daily; Financial Concerns: No; Food, Clothing or Shelter Needs: No; Support System Lacking: No; Transportation Concerns: No Electronic Signature(s) Signed: 12/23/2022 2:25:58 PM By: [name] MD FACS Entered By: [name] on 12/23/2022 12:42:18 -------------------------------------------------------------------------------- SuperBill Details Patient Name: Date of Service: [name], [name] [name] D. 12/23/2022 Medical Record Number: [id number] Patient Account Number: [id number] Date of Birth/Sex: Treating RN: 1956-08-23 (67 y.o. M) Primary Care Provider: Kathyrn Drown Other Clinician: Referring  Provider: Treating Provider/Extender: Alean Rinne in Treatment: 6 Diagnosis Coding ICD-10 Codes Code Description [phone] Non-pressure chronic ulcer of other part of right lower leg with unspecified severity L97.312 Non-pressure chronic ulcer of right ankle with fat layer exposed E11.622 Type 2 diabetes mellitus with other skin ulcer [id number] Chronic systolic (congestive) heart failure I73.9 Peripheral vascular disease, unspecified E11.42 Type 2 diabetes mellitus with diabetic polyneuropathy I63.9 Cerebral infarction, unspecified I10 Essential (primary) hypertension G89.4 Chronic pain syndrome L84 Corns and callosities Facility Procedures : CPT4 Code: [id number] Description: [id number] - DEBRIDE WOUND 1ST 20 SQ CM OR < ICD-10 Diagnosis Description L97.819 Non-pressure chronic ulcer of other part of right lower leg with unspecified seve L97.312 Non-pressure chronic ulcer of right ankle with fat layer exposed Modifier: rity Quantity: 1 Physician Procedures : CPT4 Code Description Modifier [id number] - WC PHYS LEVEL 3 - EST PT 25 ICD-10 Diagnosis Description L97.819 Non-pressure chronic ulcer of other part of right lower leg with unspecified severity L97.312 Non-pressure chronic ulcer of right ankle with  fat layer exposed E11.622 Type 2 diabetes mellitus with other skin ulcer I73.9 Peripheral vascular disease, unspecified Quantity: 1 : [id number] - WC PHYS DEBR WO ANESTH 20 SQ CM ICD-10 Diagnosis Description L97.819 Non-pressure chronic ulcer of other part of right lower leg with unspecified severity L97.312 Non-pressure chronic ulcer of right ankle with fat layer exposed Quantity: 1 Electronic Signature(s) Signed: 12/23/2022 1:09:54 PM By: [name] MD FACS Entered By: [name] on 12/23/2022 13:09:54

## 2022-12-28 ENCOUNTER — Ambulatory Visit (HOSPITAL_BASED_OUTPATIENT_CLINIC_OR_DEPARTMENT_OTHER): Payer: Medicare HMO | Admitting: Internal Medicine

## 2023-01-02 DIAGNOSIS — E1169 Type 2 diabetes mellitus with other specified complication: Secondary | ICD-10-CM | POA: Diagnosis not present

## 2023-01-03 ENCOUNTER — Encounter (HOSPITAL_BASED_OUTPATIENT_CLINIC_OR_DEPARTMENT_OTHER): Payer: Medicare HMO | Admitting: Internal Medicine

## 2023-01-03 DIAGNOSIS — E11621 Type 2 diabetes mellitus with foot ulcer: Secondary | ICD-10-CM | POA: Diagnosis not present

## 2023-01-03 DIAGNOSIS — I429 Cardiomyopathy, unspecified: Secondary | ICD-10-CM | POA: Diagnosis not present

## 2023-01-03 DIAGNOSIS — I110 Hypertensive heart disease with heart failure: Secondary | ICD-10-CM | POA: Diagnosis not present

## 2023-01-03 DIAGNOSIS — E1142 Type 2 diabetes mellitus with diabetic polyneuropathy: Secondary | ICD-10-CM | POA: Diagnosis not present

## 2023-01-03 DIAGNOSIS — I5022 Chronic systolic (congestive) heart failure: Secondary | ICD-10-CM | POA: Diagnosis not present

## 2023-01-03 DIAGNOSIS — L97512 Non-pressure chronic ulcer of other part of right foot with fat layer exposed: Secondary | ICD-10-CM | POA: Diagnosis not present

## 2023-01-03 DIAGNOSIS — I2510 Atherosclerotic heart disease of native coronary artery without angina pectoris: Secondary | ICD-10-CM | POA: Diagnosis not present

## 2023-01-03 DIAGNOSIS — E1151 Type 2 diabetes mellitus with diabetic peripheral angiopathy without gangrene: Secondary | ICD-10-CM | POA: Diagnosis not present

## 2023-01-03 DIAGNOSIS — L97312 Non-pressure chronic ulcer of right ankle with fat layer exposed: Secondary | ICD-10-CM | POA: Diagnosis not present

## 2023-01-03 DIAGNOSIS — E785 Hyperlipidemia, unspecified: Secondary | ICD-10-CM | POA: Diagnosis not present

## 2023-01-03 DIAGNOSIS — E11622 Type 2 diabetes mellitus with other skin ulcer: Secondary | ICD-10-CM | POA: Diagnosis not present

## 2023-01-04 NOTE — Progress Notes (Signed)
[place], VANSOMEREN ([id number]) [id number] Page 1 of 1 Visit Report for 01/03/2023 SuperBill Details Patient Name: Date of Service: [place] RE, [name] 01/03/2023 Medical Record Number: [id number] Patient Account Number: [id number] Date of Birth/Sex: Treating RN: May 09, 1956 (67 y.o. Valene Bors Primary Care Provider: Kathyrn Drown Other Clinician: Referring Provider: Treating Provider/Extender: Aletha Halim Weeks in Treatment: 8 Diagnosis Coding ICD-10 Codes Code Description [phone] Non-pressure chronic ulcer of other part of right lower leg with unspecified severity L97.312 Non-pressure chronic ulcer of right ankle with fat layer exposed E11.622 Type 2 diabetes mellitus with other skin ulcer [id number] Chronic systolic (congestive) heart failure I73.9 Peripheral vascular disease, unspecified E11.42 Type 2 diabetes mellitus with diabetic polyneuropathy I63.9 Cerebral infarction, unspecified I10 Essential (primary) hypertension G89.4 Chronic pain syndrome L84 Corns and callosities Facility Procedures CPT4 Code Description Modifier Quantity [id number] (Facility Use Only) [phone] - APPLY MULTLAY COMPRS LWR RT LEG 1 Electronic Signature(s) Signed: 01/03/2023 4:17:28 PM By: Baruch Gouty RN, BSN Signed: 01/03/2023 5:03:25 PM By: Kalman Shan DO Entered By: Baruch Gouty on 01/03/2023 11:52:30

## 2023-01-05 ENCOUNTER — Other Ambulatory Visit: Payer: Self-pay | Admitting: Family Medicine

## 2023-01-05 ENCOUNTER — Encounter (HOSPITAL_BASED_OUTPATIENT_CLINIC_OR_DEPARTMENT_OTHER): Payer: Medicare HMO | Admitting: General Surgery

## 2023-01-05 DIAGNOSIS — E1151 Type 2 diabetes mellitus with diabetic peripheral angiopathy without gangrene: Secondary | ICD-10-CM | POA: Diagnosis not present

## 2023-01-05 DIAGNOSIS — I2510 Atherosclerotic heart disease of native coronary artery without angina pectoris: Secondary | ICD-10-CM | POA: Diagnosis not present

## 2023-01-05 DIAGNOSIS — I5022 Chronic systolic (congestive) heart failure: Secondary | ICD-10-CM | POA: Diagnosis not present

## 2023-01-05 DIAGNOSIS — E1142 Type 2 diabetes mellitus with diabetic polyneuropathy: Secondary | ICD-10-CM | POA: Diagnosis not present

## 2023-01-05 DIAGNOSIS — L97512 Non-pressure chronic ulcer of other part of right foot with fat layer exposed: Secondary | ICD-10-CM | POA: Diagnosis not present

## 2023-01-05 DIAGNOSIS — E11621 Type 2 diabetes mellitus with foot ulcer: Secondary | ICD-10-CM | POA: Diagnosis not present

## 2023-01-05 DIAGNOSIS — I110 Hypertensive heart disease with heart failure: Secondary | ICD-10-CM | POA: Diagnosis not present

## 2023-01-05 DIAGNOSIS — E785 Hyperlipidemia, unspecified: Secondary | ICD-10-CM | POA: Diagnosis not present

## 2023-01-05 DIAGNOSIS — S81801A Unspecified open wound, right lower leg, initial encounter: Secondary | ICD-10-CM | POA: Diagnosis not present

## 2023-01-05 DIAGNOSIS — E11622 Type 2 diabetes mellitus with other skin ulcer: Secondary | ICD-10-CM | POA: Diagnosis not present

## 2023-01-05 DIAGNOSIS — I429 Cardiomyopathy, unspecified: Secondary | ICD-10-CM | POA: Diagnosis not present

## 2023-01-05 DIAGNOSIS — L97312 Non-pressure chronic ulcer of right ankle with fat layer exposed: Secondary | ICD-10-CM | POA: Diagnosis not present

## 2023-01-07 NOTE — Progress Notes (Signed)
DEMITRIUS, [name] ([id number]) [id number] Page 1 of 8 Visit Report for 01/05/2023 Arrival Information Details Patient Name: Date of Service: [name], [name] 01/05/2023 11:00 A M Medical Record Number: [id number] Patient Account Number: [id number] Date of Birth/Sex: Treating RN: 1956-05-12 (67 y.o. M) Primary Care Aryahi Denzler: Kathyrn Drown Other Clinician: Referring Ashlay Altieri: Treating Burnett Lieber/Extender: Alean Rinne in Treatment: 8 Visit Information History Since Last Visit All ordered tests and consults were completed: No Patient Arrived: Ambulatory Added or deleted any medications: No Arrival Time: 10:55 Any new allergies or adverse reactions: No Accompanied By: spouse Had a fall or experienced change in No Transfer Assistance: None activities of daily living that may affect Patient Identification Verified: Yes risk of falls: Secondary Verification Process Completed: Yes Signs or symptoms of abuse/neglect since last visito No Patient Requires Transmission-Based Precautions: No Hospitalized since last visit: No Patient Has Alerts: Yes Implantable device outside of the clinic excluding No Patient Alerts: ABI RLE [place] cellular tissue based products placed in the center since last visit: Pain Present Now: No Electronic Signature(s) Signed: 01/05/2023 11:29:36 AM By: Worthy Rancher Entered By: Worthy Rancher on 01/05/2023 10:56:21 -------------------------------------------------------------------------------- Compression Therapy Details Patient Name: Date of Service: [name] 01/05/2023 11:00 A M Medical Record Number: [id number] Patient Account Number: [id number] Date of Birth/Sex: Treating RN: [birth year] (67 y.o. [name] Primary Care Josafat Enrico: Kathyrn Drown Other Clinician: Referring Sunset Joshi: Treating Zaydrian Batta/Extender: Monna Fam Weeks in Treatment: 8 Compression Therapy Performed for  Wound Assessment: Wound #1 Right,Lateral Lower Leg Performed By: Clinician Dellie Catholic, RN Compression Type: Three Layer Post Procedure Diagnosis Same as Pre-procedure Electronic Signature(s) Signed: 01/05/2023 6:25:31 PM By: Dellie Catholic RN Entered By: Dellie Catholic on 01/05/2023 11:27:24 -------------------------------------------------------------------------------- Encounter Discharge Information Details Patient Name: Date of Service: [place] Foss [name], [name] 01/05/2023 11:00 A M Medical Record Number: [id number] Patient Account Number: [id number] Date of Birth/Sex: Treating RN: 09-25-1956 (67 y.o. [name] Primary Care Evonna Stoltz: Kathyrn Drown Other Clinician: Referring Kathryne Ramella: Treating Deamber Buckhalter/Extender: Alean Rinne in Treatment: 8 Encounter Discharge Information Items Post Procedure Vitals Discharge Condition: Stable Temperature (F): 98.5 Ambulatory Status: Ambulatory Pulse (bpm): 77 Discharge Destination: Home Respiratory Rate (breaths/min): 20 Transportation: Private Auto Blood Pressure (mmHg): 128/76 Accompanied By: spouse [name] ([id number]) [id number] Page 2 of 8 Schedule Follow-up Appointment: Yes Clinical Summary of Care: Patient Declined Electronic Signature(s) Signed: 01/05/2023 6:25:31 PM By: Dellie Catholic RN Entered By: Dellie Catholic on 01/05/2023 12:23:02 -------------------------------------------------------------------------------- Lower Extremity Assessment Details Patient Name: Date of Service: [name] 01/05/2023 11:00 A M Medical Record Number: [id number] Patient Account Number: [id number] Date of Birth/Sex: Treating RN: June 30, 1956 (67 y.o. [name] Primary Care Hanae Waiters: Kathyrn Drown Other Clinician: Referring Armoni Depass: Treating Romar Woodrick/Extender: Kristine Royal, Scott A Weeks in Treatment: 8 Edema Assessment Assessed: [Left: No] [Right:  No] [Left: Edema] [Right: :] Calf Left: Right: Point of Measurement: From Medial Instep 38.1 cm Ankle Left: Right: Point of Measurement: From Medial Instep 26.5 cm Vascular Assessment Pulses: Dorsalis Pedis Palpable: [Right:Yes] Electronic Signature(s) Signed: 01/05/2023 6:25:31 PM By: Dellie Catholic RN Entered By: Dellie Catholic on 01/05/2023 11:09:31 -------------------------------------------------------------------------------- Multi Wound Chart Details Patient Name: Date of Service: [name], [name] 01/05/2023 11:00 A M Medical Record Number: [id number] Patient Account Number: [id number] Date of Birth/Sex: Treating RN: December 18, 1955 (67 y.o. M) Primary Care Tenecia Ignasiak: Kathyrn Drown Other Clinician: Referring Marqueze Ramcharan: Treating Menno Vanbergen/Extender:  [name] Wolfgang Phoenix, Scott A Weeks in Treatment: 8 Vital Signs Height(in): 74 Capillary Blood Glucose(mg/dl): 200 Weight(lbs): 252 Pulse(bpm): 77 Body Mass Index(BMI): 32.4 Blood Pressure(mmHg): 128/76 Temperature(F): 98.5 Respiratory Rate(breaths/min): 20 [1:Photos: No Photos] [N/A:N/A [id number] Page 3 of 8] Right, Lateral Lower Leg Right, Lateral Ankle N/A Wound Location: Shear/Friction Shear/Friction N/A Wounding Event: Trauma, Other Trauma, Other N/A Primary Etiology: Congestive Heart Failure, Congestive Heart Failure, N/A Comorbid History: Hypertension, Peripheral Venous Hypertension, Peripheral Venous Disease, Type II Diabetes, Disease, Type II Diabetes, Osteoarthritis, Neuropathy Osteoarthritis, Neuropathy 09/16/2022 09/16/2022 N/A Date Acquired: 8 8 N/A Weeks of Treatment: Open Healed - Epithelialized N/A Wound Status: No No N/A Wound Recurrence: 5x1.5x0.3 0x0x0 N/A Measurements L x W x D (cm) 5.89 0 N/A A (cm) : rea 1.767 0 N/A Volume (cm) : -38.90% 100.00% N/A % Reduction in A rea: -38.90% 100.00% N/A % Reduction in Volume: Full Thickness Without Exposed Full  Thickness Without Exposed N/A Classification: Support Structures Support Structures Medium None Present N/A Exudate A mount: Serosanguineous N/A N/A Exudate Type: red, brown N/A N/A Exudate Color: Medium (34-66%) None Present (0%) N/A Granulation A mount: Red N/A N/A Granulation Quality: Medium (34-66%) None Present (0%) N/A Necrotic A mount: Fat Layer (Subcutaneous Tissue): Yes Fascia: No N/A Exposed Structures: Fascia: No Fat Layer (Subcutaneous Tissue): No Tendon: No Tendon: No Muscle: No Muscle: No Joint: No Joint: No Bone: No Bone: No Small (1-33%) Large (67-100%) N/A Epithelialization: Debridement - Selective/Open Wound N/A N/A Debridement: Pre-procedure Verification/Time Out 11:20 N/A N/A Taken: Lidocaine 5% topical ointment N/A N/A Pain Control: Slough N/A N/A Tissue Debrided: Non-Viable Tissue N/A N/A Level: 7.5 N/A N/A Debridement A (sq cm): rea Curette N/A N/A Instrument: Minimum N/A N/A Bleeding: Pressure N/A N/A Hemostasis A chieved: 0 N/A N/A Procedural Pain: 0 N/A N/A Post Procedural Pain: Procedure was tolerated well N/A N/A Debridement Treatment Response: 5x1.5x0.1 N/A N/A Post Debridement Measurements L x W x D (cm) 0.589 N/A N/A Post Debridement Volume: (cm) Scarring: Yes Excoriation: No N/A Periwound Skin Texture: Induration: No Callus: No Crepitus: No Rash: No Scarring: No No Abnormalities Noted Maceration: No N/A Periwound Skin Moisture: Dry/Scaly: No Hemosiderin Staining: Yes Atrophie Blanche: No N/A Periwound Skin Color: Cyanosis: No Ecchymosis: No Erythema: No Hemosiderin Staining: No Mottled: No Pallor: No Rubor: No No Abnormality No Abnormality N/A Temperature: Compression Therapy N/A N/A Procedures Performed: Debridement Negative Pressure Wound Therapy Application (NPWT) Paring/cutting 1 benign hyperkeratotic lesion Treatment Notes Electronic Signature(s) Signed: 01/05/2023 12:20:43 PM By: [name] MD FACS Entered By: [name] on 01/05/2023 12:20:43 Multi-Disciplinary Care Plan Details -------------------------------------------------------------------------------- [name] ([id number]) [id number] Page 4 of 8 Patient Name: Date of Service: Dewey [name], [name] 01/05/2023 11:00 A M Medical Record Number: [id number] Patient Account Number: [id number] Date of Birth/Sex: Treating RN: Jan 31, 1956 (67 y.o. [name] Primary Care Ahaana Rochette: Kathyrn Drown Other Clinician: Referring Max Romano: Treating Zebulin Siegel/Extender: Alean Rinne in Treatment: 8 Multidisciplinary Care Plan reviewed with physician Active Inactive Venous Leg Ulcer Nursing Diagnoses: Knowledge deficit related to disease process and management Potential for venous Insuffiency (use before diagnosis confirmed) Goals: Patient will maintain optimal edema control Date Initiated: 11/23/2022 Target Resolution Date: 03/17/2023 Goal Status: Active Interventions: Assess peripheral edema status every visit. Compression as ordered Treatment Activities: Therapeutic compression applied : 11/23/2022 Notes: Wound/Skin Impairment Nursing Diagnoses: Impaired tissue integrity Goals: Patient/caregiver will verbalize understanding of skin care regimen Date Initiated: 11/23/2022 Target Resolution Date: 03/17/2023 Goal Status: Active Ulcer/skin breakdown will  have a volume reduction of 30% by week 4 Date Initiated: 11/07/2022 Target Resolution Date: 03/17/2023 Goal Status: Active Interventions: Assess patient/caregiver ability to obtain necessary supplies Assess patient/caregiver ability to perform ulcer/skin care regimen upon admission and as needed Assess ulceration(s) every visit Treatment Activities: Skin care regimen initiated : 11/07/2022 Topical wound management initiated : 11/07/2022 Notes: Electronic Signature(s) Signed: 01/05/2023 6:25:31 PM By:  Dellie Catholic RN Entered By: Dellie Catholic on 01/05/2023 12:16:57 -------------------------------------------------------------------------------- Negative Pressure Wound Therapy Application (NPWT) Details Patient Name: Date of Service: [name] 01/05/2023 11:00 A M Medical Record Number: [id number] Patient Account Number: [id number] Date of Birth/Sex: Treating RN: 03/08/1956 (67 y.o. M) Primary Care Yonatan Guitron: Kathyrn Drown Other Clinician: Referring Zahlia Deshazer: Treating Baylee Campus/Extender: Kristine Royal, Scott A Weeks in Treatment: 8 NPWT Application Performed for: Wound #1 Right,Lateral Lower Leg Additional Injuries Covered: No Performed By: [name], MD Type: VAC System Coverage Size (sq cm): 7.5 XANDAR, PALAZZOLO ([id number]) [id number] Page 5 of 8 Pressure Type: Constant Pressure Setting: 125 mmHG Drain Type: None Primary Contact: Non-Adherent Quantity of Sponges/Gauze Inserted: 1 Sponge/Dressing Type: Foam- Black Date Initiated: 01/05/2023 Response to Treatment: Tolerated well Post Procedure Diagnosis Same as Pre-procedure Notes Scribed for Dr. Celine Ahr by J.Scotton Electronic Signature(s) Signed: 01/05/2023 12:28:11 PM By: [name] MD FACS Previous Signature: 01/05/2023 12:21:11 PM Version By: [name] MD FACS Entered By: [name] on 01/05/2023 12:28:11 -------------------------------------------------------------------------------- Pain Assessment Details Patient Name: Date of Service: [name], [name] 01/05/2023 11:00 A M Medical Record Number: [id number] Patient Account Number: [id number] Date of Birth/Sex: Treating RN: 10-08-1956 (67 y.o. M) Primary Care Kippy Melena: Kathyrn Drown Other Clinician: Referring Meeka Cartelli: Treating Merita Hawks/Extender: Kristine Royal, Scott A Weeks in Treatment: 8 Active Problems Location of Pain Severity and Description of Pain Patient Has Paino  No Site Locations Pain Management and Medication Current Pain Management: Electronic Signature(s) Signed: 01/05/2023 11:29:36 AM By: Worthy Rancher Entered By: Worthy Rancher on 01/05/2023 10:57:23 -------------------------------------------------------------------------------- Patient/Caregiver Education Details Patient Name: Date of Service: [place] [name], [name]. 3/21/2024andnbsp11:00 [place] Record Number: [id number] Patient Account Number: [id number] Date of Birth/Gender: Treating RN: 09/22/1956 (67 y.o. [name] Primary Care Physician: Kathyrn Drown Other Clinician: Referring Physician: Treating Physician/Extender: Alean Rinne in Treatment: [address] ([id number]) [id number] Page 6 of 8 Education Assessment Education Provided To: Patient Education Topics Provided Wound/Skin Impairment: Methods: Explain/Verbal Responses: Return demonstration correctly Electronic Signature(s) Signed: 01/05/2023 6:25:31 PM By: Dellie Catholic RN Entered By: Dellie Catholic on 01/05/2023 12:17:16 -------------------------------------------------------------------------------- Wound Assessment Details Patient Name: Date of Service: [name] 01/05/2023 11:00 A M Medical Record Number: [id number] Patient Account Number: [id number] Date of Birth/Sex: Treating RN: Apr 20, 1956 (67 y.o. [name] Primary Care Anis Cinelli: Kathyrn Drown Other Clinician: Referring Danniella Robben: Treating Jamichael Knotts/Extender: Monna Fam Weeks in Treatment: 8 Wound Status Wound Number: 1 Primary Trauma, Other Etiology: Wound Location: Right, Lateral Lower Leg Wound Open Wounding Event: Shear/Friction Status: Date Acquired: 09/16/2022 Comorbid Congestive Heart Failure, Hypertension, Peripheral Venous Weeks Of Treatment: 8 History: Disease, Type II Diabetes, Osteoarthritis, Neuropathy Clustered Wound: No Wound  Measurements Length: (cm) 5 Width: (cm) 1.5 Depth: (cm) 0.3 Area: (cm) 5.89 Volume: (cm) 1.767 % Reduction in Area: -38.9% % Reduction in Volume: -38.9% Epithelialization: Small (1-33%) Tunneling: No Undermining: No Wound Description Classification: Full Thickness Without Exposed Support Structures Exudate Amount: Medium Exudate Type: Serosanguineous Exudate Color: red, brown Foul Odor After  Cleansing: No Slough/Fibrino Yes Wound Bed Granulation Amount: Medium (34-66%) Exposed Structure Granulation Quality: Red Fascia Exposed: No Necrotic Amount: Medium (34-66%) Fat Layer (Subcutaneous Tissue) Exposed: Yes Necrotic Quality: Adherent Slough Tendon Exposed: No Muscle Exposed: No Joint Exposed: No Bone Exposed: No Periwound Skin Texture Texture Color No Abnormalities Noted: No No Abnormalities Noted: No Scarring: Yes Hemosiderin Staining: Yes Moisture Temperature / Pain No Abnormalities Noted: Yes Temperature: No Abnormality Treatment Notes Wound #1 (Lower Leg) Wound Laterality: Right, Lateral Cleanser LOSSIE, JURGENSEN ([id number]) [id number] Page 7 of 8 Soap and Water Discharge Instruction: May shower and wash wound with dial antibacterial soap and water prior to dressing change. Wound Cleanser Discharge Instruction: Cleanse the wound with wound cleanser prior to applying a clean dressing using gauze sponges, not tissue or cotton balls. Peri-Wound Care Sween Lotion (Moisturizing lotion) Discharge Instruction: Apply moisturizing lotion as directed Topical Primary Dressing Sorbalgon AG Dressing 2x2 (in/in) Discharge Instruction: Apply to wound bed as instructed WOUND VAC Discharge Instruction: apply to wound in RLLL Secondary Dressing Zetuvit Plus 4x8 in Discharge Instruction: Apply over primary dressing as directed. Secured With Transpore Surgical Tape, 2x10 (in/yd) Discharge Instruction: Secure dressing with tape as  directed. Compression Wrap ThreePress (3 layer compression wrap) Discharge Instruction: Apply three layer compression as directed. Compression Stockings Add-Ons Electronic Signature(s) Signed: 01/05/2023 6:25:31 PM By: Dellie Catholic RN Entered By: Dellie Catholic on 01/05/2023 11:11:02 -------------------------------------------------------------------------------- Wound Assessment Details Patient Name: Date of Service: [name] 01/05/2023 11:00 A M Medical Record Number: [id number] Patient Account Number: [id number] Date of Birth/Sex: Treating RN: 1956-01-09 (67 y.o. [name] Primary Care Cherly Erno: Kathyrn Drown Other Clinician: Referring Arliene Rosenow: Treating Ko Bardon/Extender: Monna Fam Weeks in Treatment: 8 Wound Status Wound Number: 2 Primary Trauma, Other Etiology: Wound Location: Right, Lateral Ankle Wound Healed - Epithelialized Wounding Event: Shear/Friction Status: Date Acquired: 09/16/2022 Comorbid Congestive Heart Failure, Hypertension, Peripheral Venous Weeks Of Treatment: 8 History: Disease, Type II Diabetes, Osteoarthritis, Neuropathy Clustered Wound: No Photos Wound Measurements Length: (cm) Width: (cm) [name] ([id number]) Depth: (cm) Area: (cm) Volume: (cm) 0 % Reduction in Area: 100% 0 % Reduction in Volume: 100% [id number] Page 8 of 8 0 Epithelialization: Large (67-100%) 0 Tunneling: No 0 Undermining: No Wound Description Classification: Full Thickness Without Exposed Support Structures Exudate Amount: None Present Foul Odor After Cleansing: No Slough/Fibrino No Wound Bed Granulation Amount: None Present (0%) Exposed Structure Necrotic Amount: None Present (0%) Fascia Exposed: No Fat Layer (Subcutaneous Tissue) Exposed: No Tendon Exposed: No Muscle Exposed: No Joint Exposed: No Bone Exposed: No Periwound Skin Texture Texture Color No Abnormalities Noted:  Yes No Abnormalities Noted: Yes Moisture Temperature / Pain No Abnormalities Noted: Yes Temperature: No Abnormality Electronic Signature(s) Signed: 01/05/2023 6:25:31 PM By: Dellie Catholic RN Entered By: Dellie Catholic on 01/05/2023 11:22:49 -------------------------------------------------------------------------------- [place] Details Patient Name: Date of Service: [place] [name], [name] 01/05/2023 11:00 A M Medical Record Number: [id number] Patient Account Number: [id number] Date of Birth/Sex: Treating RN: Jul 19, 1956 (67 y.o. M) Primary Care Ashanti Ratti: Kathyrn Drown Other Clinician: Referring Suzann Lazaro: Treating Irineo Gaulin/Extender: Kristine Royal, Scott A Weeks in Treatment: 8 Vital Signs Time Taken: 10:56 Temperature (F): 98.5 Height (in): 74 Pulse (bpm): 77 Weight (lbs): 252 Respiratory Rate (breaths/min): 20 Body Mass Index (BMI): 32.4 Blood Pressure (mmHg): 128/76 Capillary Blood Glucose (mg/dl): 200 Reference Range: 80 - 120 mg / dl Electronic Signature(s) Signed: 01/05/2023 11:29:36 AM By: Worthy Rancher Entered By: Worthy Rancher on 01/05/2023 10:56:58

## 2023-01-07 NOTE — Progress Notes (Signed)
TEGHAN, SCHNEEKLOTH ([id number]) [id number] Page 1 of 10 Visit Report for 01/05/2023 Chief Complaint Document Details Patient Name: Date of Service: [name] 01/05/2023 11:00 A M Medical Record Number: [id number] Patient Account Number: [id number] Date of Birth/Sex: Treating RN: [birth year] (67 y.o. M) Primary Care Provider: Kathyrn Drown Other Clinician: Referring Provider: Treating Provider/Extender: Alean Rinne in Treatment: 8 Information Obtained from: Patient Chief Complaint Patients presents for treatment of an open diabetic ulcer on his right foot, as well as ulcers on his right lower leg and ankle Electronic Signature(s) Signed: 01/05/2023 12:21:37 PM By: [name] MD FACS Entered By: [name] on 01/05/2023 12:21:37 -------------------------------------------------------------------------------- Debridement Details Patient Name: Date of Service: [name], [name] 01/05/2023 11:00 A M Medical Record Number: [id number] Patient Account Number: [id number] Date of Birth/Sex: Treating RN: 09-Dec-1955 (67 y.o. [name] Primary Care Provider: Kathyrn Drown Other Clinician: Referring Provider: Treating Provider/Extender: Alean Rinne in Treatment: 8 Debridement Performed for Assessment: Wound #1 Right,Lateral Lower Leg Performed By: Physician [name], MD Debridement Type: Debridement Level of Consciousness (Pre-procedure): Awake and Alert Pre-procedure Verification/Time Out Yes - 11:20 Taken: Start Time: 11:20 Pain Control: Lidocaine 5% topical ointment T Area Debrided (L x W): otal 5 (cm) x 1.5 (cm) = 7.5 (cm) Tissue and other material debrided: Non-Viable, Slough, Slough Level: Non-Viable Tissue Debridement Description: Selective/Open Wound Instrument: Curette Bleeding: Minimum Hemostasis Achieved: Pressure End Time: 11:22 Procedural Pain: 0 Post  Procedural Pain: 0 Response to Treatment: Procedure was tolerated well Level of Consciousness (Post- Awake and Alert procedure): Post Debridement Measurements of Total Wound Length: (cm) 5 Width: (cm) 1.5 Depth: (cm) 0.1 Volume: (cm) 0.589 Character of Wound/Ulcer Post Debridement: Improved Post Procedure Diagnosis Same as Pre-procedure Notes Scribed for Dr. Celine Ahr by J.Scotton Electronic Signature(s) Signed: 01/05/2023 12:41:48 PM By: [name] MD FACS Signed: 01/05/2023 6:25:31 PM By: Dellie Catholic RN [name] ([id number]) [phone].pdf Page 2 of 10 Entered By: Dellie Catholic on 01/05/2023 11:25:51 -------------------------------------------------------------------------------- HPI Details Patient Name: Date of Service: [name], [name] 01/05/2023 11:00 A M Medical Record Number: [id number] Patient Account Number: [id number] Date of Birth/Sex: Treating RN: Jun 30, 1956 (67 y.o. 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 He has a remote history of surgery on his right ankle and apparently as his legs have become increasingly more swollen and he has insisted upon wearing boots, the pressure and friction seem to have opened up wounds along the old scar line. In addition, he has a large callus on the third metatarsal head, related to his acquired foot deformity. He sees a podiatrist who periodically shaves this callus down and he was also recommended orthotics,  but the patient has refused them because he likes his boots. There is an area of discoloration in the lower portion of this callus, suspicious for an open wound. 11/14/2022: The ulcer on his third metatarsal head has closed. The lateral leg ulcers are both about the same size, but he says that they feel better. There is slough accumulation in both sites. 11/23/2022: The  ulcer on his foot remains closed. The lateral leg ulcers are still unchanged in size, but are a bit cleaner, just with some slough accumulation. 12/01/2022: No significant change to the size of the wounds on his lateral leg. The larger is quite clean with good granulation tissue present. The smaller still has fairly thick slough buildup. 12/09/2022: Both wounds are smaller today. They still have slough accumulation, but less so than at his previous visit. The callus on his foot has built up again and is uncomfortable. 12/16/2022: The wound at his ankle is nearly closed with just a little slough on the remaining open surface. The lateral leg wound is about the same size but has less slough accumulation. We are still working on Biochemist, clinical for wound VAC. 12/23/2022: The wound is ankle is down to just a pinhole with a small amount of slough and eschar present. The lateral leg wound has contracted in length and is also shallower. There is slough on the surface. We still have not secured insurance approval for his wound VAC. 01/05/2023: The ankle wound is healed underneath a layer of eschar. The lateral leg wound is smaller and has some slough accumulation. He was finally approved for a wound VAC and has it with him today. Electronic Signature(s) Signed: 01/05/2023 12:22:43 PM By: [name] MD FACS Entered By: [name] on 01/05/2023 12:22:43 -------------------------------------------------------------------------------- Paring/cutting 1 benign hyperkeratotic lesion Details Patient Name: Date of Service: [name]  01/05/2023 11:00 A M Medical Record Number: [id number] Patient Account Number: [id number] Date of Birth/Sex: Treating RN: 10-06-1956 (67 y.o. M) Primary Care Provider: Kathyrn Drown Other Clinician: Referring Provider: Treating Provider/Extender: Alean Rinne in Treatment: 8 Procedure Performed for: Non-Wound Location Performed By: Physician [name], MD Post Procedure Diagnosis Same as Pre-procedure Electronic Signature(s) Signed: 01/05/2023 12:27:39 PM By: [name] MD FACS Entered By: [name] on 01/05/2023 12:27:39 Physical Exam Details -------------------------------------------------------------------------------- [name] ([id number]) [id number] Page 3 of 10 Patient Name: Date of Service: [facility] [name], [name] 01/05/2023 11:00 A M Medical Record Number: [id number] Patient Account Number: [id number] Date of Birth/Sex: Treating RN: Jun 14, 1956 (67 y.o. M) Primary Care Provider: Kathyrn Drown Other Clinician: Referring Provider: Treating Provider/Extender: Kristine Royal, Scott A Weeks in Treatment: 8 Constitutional . . . . no acute distress. Respiratory Normal work of breathing on room air. Notes 01/05/2023: The ankle wound is healed underneath a layer of eschar. The lateral leg wound is smaller and has some slough accumulation. Electronic Signature(s) Signed: 01/05/2023 12:23:10 PM By: [name] MD FACS Entered By: [name] on 01/05/2023 12:23:09 -------------------------------------------------------------------------------- Physician Orders Details Patient Name: Date of Service: [name], [name] 01/05/2023 11:00 A M Medical Record Number: [id number] Patient Account Number: [id number] Date of Birth/Sex: Treating RN: Apr 21, 1956 (67 y.o. [name] Primary Care Provider: Kathyrn Drown Other Clinician: Referring Provider: Treating Provider/Extender: Alean Rinne in Treatment: 8 Verbal / Phone Orders: No Diagnosis Coding ICD-10 Coding Code Description L97.819 Non-pressure chronic ulcer of other part of right lower leg with unspecified severity E11.622 Type 2 diabetes mellitus with other skin ulcer [id number] Chronic systolic (congestive) heart failure I73.9 Peripheral vascular disease, unspecified E11.42 Type 2 diabetes mellitus with diabetic polyneuropathy I63.9 Cerebral infarction, unspecified I10 Essential (primary) hypertension G89.4 Chronic pain syndrome L84 Corns and callosities Follow-up Appointments ppointment in 1 week. - Dr. Celine Ahr Room 3 Return A Wound Vac change either Thursday or Friday Nurse  Visit: - Wound Vac change either Monday or Tuesday Anesthetic Wound #1 Right,Lateral Lower Leg (In clinic) Topical Lidocaine 4% applied to wound bed Bathing/ Shower/ Hygiene May shower with protection but do not get wound dressing(s) wet. Protect dressing(s) with water repellant cover (for example, large plastic bag) or a cast cover and may then take shower. - May purchase cast protector at [facility], [facility], or [facility] Negative Presssure Wound Therapy Wound #1 Right,Lateral Lower Leg Wound Vac to wound continuously at 169mm/hg pressure - RUN IVR WOUND VAC for Right Lateral leg Black Foam Edema Control - Lymphedema / SCD / Other Right Lower Extremity Elevate legs to the level of the heart or above for 30 minutes daily and/or when sitting for 3-4 times a day throughout the day. Avoid standing for long periods of time. Off-Loading ALAA, OMARI ([id number]) [id number] Page 4 of 10 Other: - Wear prescribed foot wear (Orthotics). Do not wear the boots. Home Health Admit to [facility] for skilled nursing wound care. May utilize formulary equivalent dressing for wound treatment orders unless otherwise specified. - For wound vac change 1 x week (the other wound vac change will be  performed at the [facility] 1 x week) Wound Treatment Wound #1 - Lower Leg Wound Laterality: Right, Lateral Cleanser: Soap and Water 1 x Per Week/30 Days Discharge Instructions: May shower and wash wound with dial antibacterial soap and water prior to dressing change. Cleanser: Wound Cleanser 1 x Per Week/30 Days Discharge Instructions: Cleanse the wound with wound cleanser prior to applying a clean dressing using gauze sponges, not tissue or cotton balls. Peri-Wound Care: Sween Lotion (Moisturizing lotion) 1 x Per Week/30 Days Discharge Instructions: Apply moisturizing lotion as directed Prim Dressing: Sorbalgon AG Dressing 2x2 (in/in) 1 x Per Week/30 Days ary Discharge Instructions: Apply to wound bed as instructed Prim Dressing: WOUND VAC 1 x Per Week/30 Days ary Discharge Instructions: apply to wound in RLLL Secondary Dressing: Zetuvit Plus 4x8 in 1 x Per Week/30 Days Discharge Instructions: Apply over primary dressing as directed. Secured With: Transpore Surgical Tape, 2x10 (in/yd) 1 x Per Week/30 Days Discharge Instructions: Secure dressing with tape as directed. Compression Wrap: ThreePress (3 layer compression wrap) 1 x Per Week/30 Days Discharge Instructions: Apply three layer compression as directed. Electronic Signature(s) Signed: 01/05/2023 12:41:48 PM By: [name] MD FACS Entered By: [name] on 01/05/2023 12:26:29 -------------------------------------------------------------------------------- Problem List Details Patient Name: Date of Service: [name], [name] 01/05/2023 11:00 A M Medical Record Number: [id number] Patient Account Number: [id number] Date of Birth/Sex: Treating RN: [birth year] (67 y.o. M) Primary Care Provider: Kathyrn Drown Other Clinician: Referring Provider: Treating Provider/Extender: Alean Rinne in Treatment: 8 Active Problems ICD-10 Encounter Code Description Active Date MDM Diagnosis L97.819 Non-pressure  chronic ulcer of other part of right lower leg with unspecified 11/07/2022 No Yes severity E11.622 Type 2 diabetes mellitus with other skin ulcer 11/07/2022 No Yes [id number] Chronic systolic (congestive) heart failure 11/07/2022 No Yes I73.9 Peripheral vascular disease, unspecified 11/07/2022 No Yes E11.42 Type 2 diabetes mellitus with diabetic polyneuropathy 11/07/2022 No Yes [name], [name] ([id number]) [id number] Page 5 of 10 I63.9 Cerebral infarction, unspecified 11/07/2022 No Yes I10 Essential (primary) hypertension 11/07/2022 No Yes G89.4 Chronic pain syndrome 11/07/2022 No Yes L84 Corns and callosities 12/09/2022 No Yes Inactive Problems ICD-10 Code Description Active Date Inactive Date L97.512 Non-pressure chronic ulcer of other part of right foot with fat layer exposed 11/07/2022 11/07/2022 E11.621 Type 2 diabetes mellitus with foot  ulcer 11/07/2022 11/07/2022 Resolved Problems ICD-10 Code Description Active Date Resolved Date L97.312 Non-pressure chronic ulcer of right ankle with fat layer exposed 11/07/2022 11/07/2022 Electronic Signature(s) Signed: 01/05/2023 12:20:34 PM By: [name] MD FACS Entered By: [name] on 01/05/2023 12:20:33 -------------------------------------------------------------------------------- Progress Note Details Patient Name: Date of Service: [name], [name] 01/05/2023 11:00 A M Medical Record Number: [id number] Patient Account Number: [id number] Date of Birth/Sex: Treating RN: 1956-01-23 (67 y.o. M) Primary Care Provider: Kathyrn Drown Other Clinician: Referring Provider: Treating Provider/Extender: Alean Rinne in Treatment: 8 Subjective Chief Complaint Information obtained from Patient Patients presents for treatment of an open diabetic ulcer on his right foot, as well as ulcers on his right lower leg and ankle History of Present Illness (HPI) ADMISSION 11/07/2022 This is a 67 year old  man with a past medical history significant for type 2 diabetes (last hemoglobin A1c 7.1), cerebrovascular occlusive disease, coronary artery disease, peripheral vascular disease, congestive heart failure, chronic pain syndrome, and prior amputation of his right great and second toes. He has a remote history of surgery on his right ankle and apparently as his legs have become increasingly more swollen and he has insisted upon wearing boots, the pressure and friction seem to have opened up wounds along the old scar line. In addition, he has a large callus on the third metatarsal head, related to his acquired foot deformity. He sees a podiatrist who periodically shaves this callus down and he was also recommended orthotics, but the patient has refused them because he likes his boots. There is an area of discoloration in the lower portion of this callus, suspicious for an open wound. 11/14/2022: The ulcer on his third metatarsal head has closed. The lateral leg ulcers are both about the same size, but he says that they feel better. There is slough accumulation in both sites. 11/23/2022: The ulcer on his foot remains closed. The lateral leg ulcers are still unchanged in size, but are a bit cleaner, just with some slough accumulation. 12/01/2022: No significant change to the size of the wounds on his lateral leg. The larger is quite clean with good granulation tissue present. The smaller still has fairly thick slough buildup. 12/09/2022: Both wounds are smaller today. They still have slough accumulation, but less so than at his previous visit. The callus on his foot has built up again [name], [name] ([id number]) [phone].pdf Page 6 of 10 and is uncomfortable. 12/16/2022: The wound at his ankle is nearly closed with just a little slough on the remaining open surface. The lateral leg wound is about the same size but has less slough accumulation. We are still working on Biochemist, clinical  for wound VAC. 12/23/2022: The wound is ankle is down to just a pinhole with a small amount of slough and eschar present. The lateral leg wound has contracted in length and is also shallower. There is slough on the surface. We still have not secured insurance approval for his wound VAC. 01/05/2023: The ankle wound is healed underneath a layer of eschar. The lateral leg wound is smaller and has some slough accumulation. He was finally approved for a wound VAC and has it with him today. Patient History Family History Cancer - Father,Mother,Siblings, Diabetes - Mother,Siblings, Heart Disease, Lung Disease - Father, No family history of Hereditary Spherocytosis, Hypertension, Kidney Disease, Seizures, Stroke, Thyroid Problems, Tuberculosis. Social History Former smoker - ended on 12/23/1970, Marital Status - Married, Alcohol Use - Never, Drug Use - No  History, Caffeine Use - Daily. Medical History Eyes Denies history of Cataracts, Glaucoma, Optic Neuritis Ear/Nose/Mouth/Throat Denies history of Chronic sinus problems/congestion, Middle ear problems Respiratory Denies history of Aspiration, Asthma, Chronic Obstructive Pulmonary Disease (COPD), Pneumothorax, Sleep Apnea, Tuberculosis Cardiovascular Patient has history of Congestive Heart Failure, Hypertension, Peripheral Venous Disease Endocrine Patient has history of Type II Diabetes Musculoskeletal Patient has history of Osteoarthritis Neurologic Patient has history of Neuropathy Oncologic Denies history of Received Chemotherapy, Received Radiation Psychiatric Denies history of Anorexia/bulimia, Confinement Anxiety Medical A Surgical History Notes nd Cardiovascular cardiomyopathy hyperlipidemia hypokalemia stroke Genitourinary orchitis Objective Constitutional no acute distress. Vitals Time Taken: 10:56 AM, Height: 74 in, Weight: 252 lbs, BMI: 32.4, Temperature: 98.5 F, Pulse: 77 bpm, Respiratory Rate: 20 breaths/min, Blood Pressure:  128/76 mmHg, Capillary Blood Glucose: 200 mg/dl. Respiratory Normal work of breathing on room air. General Notes: 01/05/2023: The ankle wound is healed underneath a layer of eschar. The lateral leg wound is smaller and has some slough accumulation. Integumentary (Hair, Skin) Wound #1 status is Open. Original cause of wound was Shear/Friction. The date acquired was: 09/16/2022. The wound has been in treatment 8 weeks. The wound is located on the Right,Lateral Lower Leg. The wound measures 5cm length x 1.5cm width x 0.3cm depth; 5.89cm^2 area and 1.767cm^3 volume. There is Fat Layer (Subcutaneous Tissue) exposed. There is no tunneling or undermining noted. There is a medium amount of serosanguineous drainage noted. There is medium (34-66%) red granulation within the wound bed. There is a medium (34-66%) amount of necrotic tissue within the wound bed including Adherent Slough. The periwound skin appearance had no abnormalities noted for moisture. The periwound skin appearance exhibited: Scarring, Hemosiderin Staining. Periwound temperature was noted as No Abnormality. Wound #2 status is Healed - Epithelialized. Original cause of wound was Shear/Friction. The date acquired was: 09/16/2022. The wound has been in treatment 8 weeks. The wound is located on the Right,Lateral Ankle. The wound measures 0cm length x 0cm width x 0cm depth; 0cm^2 area and 0cm^3 volume. There is no tunneling or undermining noted. There is a none present amount of drainage noted. There is no granulation within the wound bed. There is no necrotic tissue within the wound bed. The periwound skin appearance had no abnormalities noted for texture. The periwound skin appearance had no abnormalities noted for moisture. The periwound skin appearance had no abnormalities noted for color. Periwound temperature was noted as No Abnormality. Assessment [name], [name] ([id number]) [id number] Page 7 of 10 Active  Problems ICD-10 Non-pressure chronic ulcer of other part of right lower leg with unspecified severity Type 2 diabetes mellitus with other skin ulcer Chronic systolic (congestive) heart failure Peripheral vascular disease, unspecified Type 2 diabetes mellitus with diabetic polyneuropathy Cerebral infarction, unspecified Essential (primary) hypertension Chronic pain syndrome Corns and callosities Procedures Wound #1 Pre-procedure diagnosis of Wound #1 is a Trauma, Other located on the Right,Lateral Lower Leg . There was a Selective/Open Wound Non-Viable Tissue Debridement with a total area of 7.5 sq cm performed by [name], MD. With the following instrument(s): Curette to remove Non-Viable tissue/material. Material removed includes [facility] after achieving pain control using Lidocaine 5% topical ointment. No specimens were taken. A time out was conducted at 11:20, prior to the start of the procedure. A Minimum amount of bleeding was controlled with Pressure. The procedure was tolerated well with a pain level of 0 throughout and a pain level of 0 following the procedure. Post Debridement Measurements: 5cm length x 1.5cm width x 0.1cm depth;  0.589cm^3 volume. Character of Wound/Ulcer Post Debridement is improved. Post procedure Diagnosis Wound #1: Same as Pre-Procedure General Notes: Scribed for Dr. Celine Ahr by J.Scotton. Pre-procedure diagnosis of Wound #1 is a Trauma, Other located on the Right,Lateral Lower Leg . There was a Three Layer Compression Therapy Procedure by Dellie Catholic, RN. Post procedure Diagnosis Wound #1: Same as Pre-Procedure A Paring/cutting 1 benign hyperkeratotic lesion procedure was performed. by [name], MD. Post procedure Diagnosis Wound #: Same as Pre-Procedure Plan Follow-up Appointments: Return Appointment in 1 week. - Dr. Celine Ahr Room 3 Wound Vac change either Thursday or Friday Nurse Visit: - Wound Vac change either Monday or  Tuesday Anesthetic: Wound #1 Right,Lateral Lower Leg: (In clinic) Topical Lidocaine 4% applied to wound bed Bathing/ Shower/ Hygiene: May shower with protection but do not get wound dressing(s) wet. Protect dressing(s) with water repellant cover (for example, large plastic bag) or a cast cover and may then take shower. - May purchase cast protector at [facility], [facility], or [facility] Negative Presssure Wound Therapy: Wound #1 Right,Lateral Lower Leg: Wound Vac to wound continuously at 173mm/hg pressure - RUN IVR WOUND VAC for Right Lateral leg Black Foam Edema Control - Lymphedema / SCD / Other: Elevate legs to the level of the heart or above for 30 minutes daily and/or when sitting for 3-4 times a day throughout the day. Avoid standing for long periods of time. Off-Loading: Other: - Wear prescribed foot wear (Orthotics). Do not wear the boots. Home Health: Admit to Home Health for skilled nursing wound care. May utilize formulary equivalent dressing for wound treatment orders unless otherwise specified. - For wound vac change 1 x week (the other wound vac change will be performed at the [facility] 1 x week) WOUND #1: - Lower Leg Wound Laterality: Right, Lateral Cleanser: Soap and Water 1 x Per Week/30 Days Discharge Instructions: May shower and wash wound with dial antibacterial soap and water prior to dressing change. Cleanser: Wound Cleanser 1 x Per Week/30 Days Discharge Instructions: Cleanse the wound with wound cleanser prior to applying a clean dressing using gauze sponges, not tissue or cotton balls. Peri-Wound Care: Sween Lotion (Moisturizing lotion) 1 x Per Week/30 Days Discharge Instructions: Apply moisturizing lotion as directed Prim Dressing: Sorbalgon AG Dressing 2x2 (in/in) 1 x Per Week/30 Days ary Discharge Instructions: Apply to wound bed as instructed Prim Dressing: WOUND VAC 1 x Per Week/30 Days ary Discharge Instructions: apply to wound in RLLL Secondary Dressing: Zetuvit Plus  4x8 in 1 x Per Week/30 Days Discharge Instructions: Apply over primary dressing as directed. Secured With: Transpore Surgical T ape, 2x10 (in/yd) 1 x Per Week/30 Days Discharge Instructions: Secure dressing with tape as directed. Com pression Wrap: ThreePress (3 layer compression wrap) 1 x Per Week/30 Days Discharge Instructions: Apply three layer compression as directed. 01/05/2023: The ankle wound is healed underneath a layer of eschar. The lateral leg wound is smaller and has some slough accumulation. [name], [name] ([id number]) [id number] Page 8 of 10 I used a curette to debride the slough from the lateral leg wound. I then personally applied the patient's wound VAC in standard fashion. It was attached to the pump at 125 mmHg continuous suction. 3 layer compression was applied by the nurse. I also pared back the callus on the patient's second metatarsal head as it had become thickened and painful again. As he does not have home health, he will come next week early in the week to have his wound VAC changed and I will  see him on Friday for a wound care visit. Electronic Signature(s) Signed: 01/05/2023 12:30:15 PM By: [name] MD FACS Entered By: [name] on 01/05/2023 12:30:15 -------------------------------------------------------------------------------- HxROS Details Patient Name: Date of Service: [name], [name] 01/05/2023 11:00 A M Medical Record Number: [id number] Patient Account Number: [id number] Date of Birth/Sex: Treating RN: 1955/10/31 (67 y.o. M) Primary Care Provider: Kathyrn Drown Other Clinician: Referring Provider: Treating Provider/Extender: Alean Rinne in Treatment: 8 Eyes Medical History: Negative for: Cataracts; Glaucoma; Optic Neuritis Ear/Nose/Mouth/Throat Medical History: Negative for: Chronic sinus problems/congestion; Middle ear problems Respiratory Medical History: Negative for:  Aspiration; Asthma; Chronic Obstructive Pulmonary Disease (COPD); Pneumothorax; Sleep Apnea; Tuberculosis Cardiovascular Medical History: Positive for: Congestive Heart Failure; Hypertension; Peripheral Venous Disease Past Medical History Notes: cardiomyopathy hyperlipidemia hypokalemia stroke Endocrine Medical History: Positive for: Type II Diabetes Time with diabetes: 20 years Treated with: Insulin, Oral agents Genitourinary Medical History: Past Medical History Notes: orchitis Musculoskeletal Medical History: Positive for: Osteoarthritis Neurologic Medical History: Positive for: Neuropathy Oncologic Medical History: Negative for: Received Chemotherapy; Received Radiation [name], [name] ([id number]) [id number] Page 9 of 10 Psychiatric Medical History: Negative for: Anorexia/bulimia; Confinement Anxiety Immunizations Pneumococcal Vaccine: Received Pneumococcal Vaccination: Yes Received Pneumococcal Vaccination On or After 60th Birthday: Yes Implantable Devices No devices added Family and Social History Cancer: Yes - Father,Mother,Siblings; Diabetes: Yes - Mother,Siblings; Heart Disease: Yes; Hereditary Spherocytosis: No; Hypertension: No; Kidney Disease: No; Lung Disease: Yes - Father; Seizures: No; Stroke: No; Thyroid Problems: No; Tuberculosis: No; Former smoker - ended on 12/23/1970; Marital Status - Married; Alcohol Use: Never; Drug Use: No History; Caffeine Use: Daily; Financial Concerns: No; Food, Clothing or Shelter Needs: No; Support System Lacking: No; Transportation Concerns: No Electronic Signature(s) Signed: 01/05/2023 12:41:48 PM By: [name] MD FACS Entered By: [name] on 01/05/2023 12:22:49 -------------------------------------------------------------------------------- SuperBill Details Patient Name: Date of Service: [name], [name] 01/05/2023 Medical Record Number: [id number] Patient Account Number:  [id number] Date of Birth/Sex: Treating RN: October 19, 1955 (67 y.o. M) Primary Care Provider: Kathyrn Drown Other Clinician: Referring Provider: Treating Provider/Extender: Alean Rinne in Treatment: 8 Diagnosis Coding ICD-10 Codes Code Description [phone] Non-pressure chronic ulcer of other part of right lower leg with unspecified severity E11.622 Type 2 diabetes mellitus with other skin ulcer [id number] Chronic systolic (congestive) heart failure I73.9 Peripheral vascular disease, unspecified E11.42 Type 2 diabetes mellitus with diabetic polyneuropathy I63.9 Cerebral infarction, unspecified I10 Essential (primary) hypertension G89.4 Chronic pain syndrome L84 Corns and callosities Facility Procedures : CPT4 Code: [id number] Description: [id number] - DEBRIDE WOUND 1ST 20 SQ CM OR < ICD-10 Diagnosis Description L97.819 Non-pressure chronic ulcer of other part of right lower leg with unspecified seve Modifier: rity Quantity: 1 : CPT4 Code: [id number] Description: [id number] NEG PRESS WND TX <=50 SQ CM Modifier: Quantity: 1 : CPT4 Code: [id number] Description: [id number] - PARE BENIGN LES; SGL ICD-10 Diagnosis Description L84 Corns and callosities Modifier: Quantity: 1 Physician Procedures : CPT4 Code Description Modifier [id number] - WC PHYS LEVEL 4 - EST PT ICD-10 Diagnosis Description L97.819 Non-pressure chronic ulcer of other part of right lower leg with unspecified severity E11.622 Type 2 diabetes mellitus with other skin ulcer  L84 Corns and callosities [name], [name] ([id number]) [id number] I73.9 Peripheral vascular disease, unspecified Quantity: 1 .pdf Page 10 of 10 : [id number] - WC PHYS DEBR WO ANESTH 20 SQ CM 1 ICD-10 Diagnosis Description L97.819 Non-pressure chronic ulcer of other part of right lower leg with  unspecified severity Quantity: : [id number] - WC PHYS PARE BENIGN LES; SGL 1 ICD-10 Diagnosis Description L84 Corns and  callosities Quantity: Electronic Signature(s) Signed: 01/05/2023 12:34:24 PM By: [name] MD FACS Entered By: [name] on 01/05/2023 12:34:23

## 2023-01-09 DIAGNOSIS — E1151 Type 2 diabetes mellitus with diabetic peripheral angiopathy without gangrene: Secondary | ICD-10-CM | POA: Diagnosis not present

## 2023-01-09 DIAGNOSIS — L97818 Non-pressure chronic ulcer of other part of right lower leg with other specified severity: Secondary | ICD-10-CM | POA: Diagnosis not present

## 2023-01-09 DIAGNOSIS — Z794 Long term (current) use of insulin: Secondary | ICD-10-CM | POA: Diagnosis not present

## 2023-01-09 DIAGNOSIS — Z87440 Personal history of urinary (tract) infections: Secondary | ICD-10-CM | POA: Diagnosis not present

## 2023-01-09 DIAGNOSIS — E1169 Type 2 diabetes mellitus with other specified complication: Secondary | ICD-10-CM | POA: Diagnosis not present

## 2023-01-09 DIAGNOSIS — I2510 Atherosclerotic heart disease of native coronary artery without angina pectoris: Secondary | ICD-10-CM | POA: Diagnosis not present

## 2023-01-09 DIAGNOSIS — I110 Hypertensive heart disease with heart failure: Secondary | ICD-10-CM | POA: Diagnosis not present

## 2023-01-09 DIAGNOSIS — Z7984 Long term (current) use of oral hypoglycemic drugs: Secondary | ICD-10-CM | POA: Diagnosis not present

## 2023-01-09 DIAGNOSIS — Z4800 Encounter for change or removal of nonsurgical wound dressing: Secondary | ICD-10-CM | POA: Diagnosis not present

## 2023-01-09 DIAGNOSIS — I5022 Chronic systolic (congestive) heart failure: Secondary | ICD-10-CM | POA: Diagnosis not present

## 2023-01-09 DIAGNOSIS — E11622 Type 2 diabetes mellitus with other skin ulcer: Secondary | ICD-10-CM | POA: Diagnosis not present

## 2023-01-09 DIAGNOSIS — E1142 Type 2 diabetes mellitus with diabetic polyneuropathy: Secondary | ICD-10-CM | POA: Diagnosis not present

## 2023-01-09 DIAGNOSIS — E669 Obesity, unspecified: Secondary | ICD-10-CM | POA: Diagnosis not present

## 2023-01-09 DIAGNOSIS — E782 Mixed hyperlipidemia: Secondary | ICD-10-CM | POA: Diagnosis not present

## 2023-01-09 DIAGNOSIS — Z8673 Personal history of transient ischemic attack (TIA), and cerebral infarction without residual deficits: Secondary | ICD-10-CM | POA: Diagnosis not present

## 2023-01-09 DIAGNOSIS — Z7902 Long term (current) use of antithrombotics/antiplatelets: Secondary | ICD-10-CM | POA: Diagnosis not present

## 2023-01-10 ENCOUNTER — Ambulatory Visit (INDEPENDENT_AMBULATORY_CARE_PROVIDER_SITE_OTHER): Payer: Medicare HMO | Admitting: Family Medicine

## 2023-01-10 ENCOUNTER — Ambulatory Visit (HOSPITAL_BASED_OUTPATIENT_CLINIC_OR_DEPARTMENT_OTHER): Payer: Medicare HMO | Admitting: General Surgery

## 2023-01-10 VITALS — BP 130/76 | HR 71 | Ht 74.0 in | Wt 246.4 lb

## 2023-01-10 DIAGNOSIS — L97909 Non-pressure chronic ulcer of unspecified part of unspecified lower leg with unspecified severity: Secondary | ICD-10-CM | POA: Diagnosis not present

## 2023-01-10 DIAGNOSIS — E1169 Type 2 diabetes mellitus with other specified complication: Secondary | ICD-10-CM

## 2023-01-10 DIAGNOSIS — Z125 Encounter for screening for malignant neoplasm of prostate: Secondary | ICD-10-CM | POA: Diagnosis not present

## 2023-01-10 DIAGNOSIS — E11622 Type 2 diabetes mellitus with other skin ulcer: Secondary | ICD-10-CM | POA: Diagnosis not present

## 2023-01-10 DIAGNOSIS — E782 Mixed hyperlipidemia: Secondary | ICD-10-CM

## 2023-01-10 DIAGNOSIS — E1142 Type 2 diabetes mellitus with diabetic polyneuropathy: Secondary | ICD-10-CM

## 2023-01-10 MED ORDER — SERTRALINE HCL 50 MG PO TABS: 50.0000 mg | ORAL_TABLET | Freq: Every day | ORAL | 3 refills | Status: DC

## 2023-01-10 MED ORDER — CLOPIDOGREL BISULFATE 75 MG PO TABS: 75.0000 mg | ORAL_TABLET | Freq: Every day | ORAL | 11 refills | Status: DC

## 2023-01-10 MED ORDER — METOPROLOL SUCCINATE ER 25 MG PO TB24: 25.0000 mg | ORAL_TABLET | Freq: Every day | ORAL | 3 refills | Status: DC

## 2023-01-10 MED ORDER — OXYCODONE-ACETAMINOPHEN 10-325 MG PO TABS: ORAL_TABLET | ORAL | 0 refills | Status: DC

## 2023-01-10 NOTE — Progress Notes (Signed)
Subjective:    Patient ID: [name], male    DOB: 02/22/1956, 67 y.o.   MRN: [id number]  HPI This patient was seen today for chronic pain  The medication list was reviewed and updated.  Location of Pain for which the patient has been treated with regarding narcotics: Lumbar pain also neck and arms  Onset of this pain: Present for years   -Compliance with medication: He relates good compliance  - Number patient states they take daily: 3 or 4 every day  -when was the last dose patient took? 0700 this morning  The patient was advised the importance of maintaining medication and not using illegal substances with these.  Here for refills and follow up  The patient was educated that we can provide 3 monthly scripts for their medication, it is their responsibility to follow the instructions.  Side effects or complications from medications: Denies side effects  Patient is aware that pain medications are meant to minimize the severity of the pain to allow their pain levels to improve to allow for better function. They are aware of that pain medications cannot totally remove their pain.  Due for UDT ( at least once per year) : 07/15/2022  Scale of 1 to 10 ( 1 is least 10 is most) Your pain level without the medicine: 8 Your pain level with medication 3  Scale 1 to 10 ( 1-helps very little, 10 helps very well) How well does your pain medication reduce your pain so you can function better through out the day? 7  Quality of the pain: Throbbing aching  Persistence of the pain: Present all the time  Modifying factors: Worse with activity  DM type 2 with diabetic mixed hyperlipidemia ([place]) - Plan: Hemoglobin 123456, Basic Metabolic Panel, CANCELED: Basic Metabolic Panel (7), CANCELED: Hemoglobin A1c, CANCELED: Basic Metabolic Panel (7)  Diabetic peripheral neuropathy (HCC)  Diabetic ulcer of lower leg (HCC)  Screening PSA (prostate specific antigen) - Plan: PSA, CANCELED:  PSA         Review of Systems     Objective:   Physical Exam General-in no acute distress Eyes-no discharge Lungs-respiratory rate normal, CTA CV-no murmurs,RRR Extremities skin warm dry no edema Neuro grossly normal Behavior normal, alert        Assessment & Plan:  1. DM type 2 with diabetic mixed hyperlipidemia (HCC) The patient was seen today as part of a comprehensive visit for diabetes. The importance of keeping her A1c at or below 7 range was discussed.  Discussed diet, activity, and medication compliance Emphasized healthy eating primarily with vegetables fruits and if utilizing meats lean meats such as chicken or fish grilled baked broiled Avoid sugary drinks Minimize and avoid processed foods Fit in regular physical activity preferably 25 to 30 minutes 4 times per week Standard follow-up visit recommended.  Patient aware lack of control and follow-up increases risk of diabetic complications. Regular follow-up visits Yearly ophthalmology Yearly foot exam  - Hemoglobin 123456 - Basic Metabolic Panel  2. Diabetic peripheral neuropathy [facility]) Patient has painful neuropathy Pain medicine does help to some degree   3. Diabetic ulcer of lower leg ([place]) Diabetic ulcer being treated by wound center they have a wound VAC on it currently  4. Screening PSA (prostate specific antigen) Lab work before next visit - PSA  Chronic low back pain and discomfort along with chronic neck pain and arm pain uses oxycodone currently takes 4/day I encouraged him to start considering to be on  less as he gets older He does have a element of cognitive dysfunction he has been told not to do any local driving or driving at all he is to maintain activity around his house as he is capable  The patient was seen in followup for chronic pain. A review over at their current pain status was discussed. Drug registry was checked. Prescriptions were given.  Regular follow-up  recommended. Discussion was held regarding the importance of compliance with medication as well as pain medication contract.  Patient was informed that medication may cause drowsiness and should not be combined  with other medications/alcohol or street drugs. If the patient feels medication is causing altered alertness then do not drive or operate dangerous equipment.  Should be noted that the patient appears to be meeting appropriate use of opioids and response.  Evidenced by improved function and decent pain control without significant side effects and no evidence of overt aberrancy issues.  Upon discussion with the patient today they understand that opioid therapy is optional and they feel that the pain has been refractory to reasonable conservative measures and is significant and affecting quality of life enough to warrant ongoing therapy and wishes to continue opioids.  Refills were provided.  We will discuss further on follow-up visit possibility of reducing the amount of medicine he takes per day in regards of pain medicine currently right now he feels he needs 4/day just to be able to tolerate the pain to be productive

## 2023-01-11 ENCOUNTER — Telehealth: Payer: Self-pay | Admitting: Family Medicine

## 2023-01-11 NOTE — Telephone Encounter (Signed)
Dr Leonie Man I appreciate your care of this patient  I just saw him for a follow-up visit  patient wife relates that they have not heard regarding MRI Patient has significant cognitive impairment When your staff sets up the MRI please have them call his wife Hassan Rowan to help set up the test  Thank you for your time-[facility]

## 2023-01-13 ENCOUNTER — Encounter (HOSPITAL_BASED_OUTPATIENT_CLINIC_OR_DEPARTMENT_OTHER): Payer: Medicare HMO | Admitting: General Surgery

## 2023-01-13 ENCOUNTER — Other Ambulatory Visit: Payer: Self-pay | Admitting: Family Medicine

## 2023-01-13 DIAGNOSIS — E785 Hyperlipidemia, unspecified: Secondary | ICD-10-CM | POA: Diagnosis not present

## 2023-01-13 DIAGNOSIS — I2510 Atherosclerotic heart disease of native coronary artery without angina pectoris: Secondary | ICD-10-CM | POA: Diagnosis not present

## 2023-01-13 DIAGNOSIS — E11621 Type 2 diabetes mellitus with foot ulcer: Secondary | ICD-10-CM | POA: Diagnosis not present

## 2023-01-13 DIAGNOSIS — E1142 Type 2 diabetes mellitus with diabetic polyneuropathy: Secondary | ICD-10-CM | POA: Diagnosis not present

## 2023-01-13 DIAGNOSIS — I110 Hypertensive heart disease with heart failure: Secondary | ICD-10-CM | POA: Diagnosis not present

## 2023-01-13 DIAGNOSIS — E1151 Type 2 diabetes mellitus with diabetic peripheral angiopathy without gangrene: Secondary | ICD-10-CM | POA: Diagnosis not present

## 2023-01-13 DIAGNOSIS — E11622 Type 2 diabetes mellitus with other skin ulcer: Secondary | ICD-10-CM | POA: Diagnosis not present

## 2023-01-13 DIAGNOSIS — L97312 Non-pressure chronic ulcer of right ankle with fat layer exposed: Secondary | ICD-10-CM | POA: Diagnosis not present

## 2023-01-13 DIAGNOSIS — I429 Cardiomyopathy, unspecified: Secondary | ICD-10-CM | POA: Diagnosis not present

## 2023-01-13 DIAGNOSIS — L97512 Non-pressure chronic ulcer of other part of right foot with fat layer exposed: Secondary | ICD-10-CM | POA: Diagnosis not present

## 2023-01-13 DIAGNOSIS — I5022 Chronic systolic (congestive) heart failure: Secondary | ICD-10-CM | POA: Diagnosis not present

## 2023-01-13 DIAGNOSIS — L97812 Non-pressure chronic ulcer of other part of right lower leg with fat layer exposed: Secondary | ICD-10-CM | POA: Diagnosis not present

## 2023-01-13 NOTE — Progress Notes (Signed)
RUSHAWN, POLLEY ([id number]) [id number] Page 1 of 9 Visit Report for 01/13/2023 Chief Complaint Document Details Patient Name: Date of Service: [name] 01/13/2023 12:45 PM Medical Record Number: [id number] Patient Account Number: [id number] Date of Birth/Sex: Treating RN: [birth year] (67 y.o. M) Primary Care Provider: Kathyrn Drown Other Clinician: Referring Provider: Treating Provider/Extender: Alean Rinne in Treatment: 9 Information Obtained from: Patient Chief Complaint Patients presents for treatment of an open diabetic ulcer on his right foot, as well as ulcers on his right lower leg and ankle Electronic Signature(s) Signed: 01/13/2023 1:19:05 PM By: [name] MD FACS Entered By: [name] on 01/13/2023 13:19:05 -------------------------------------------------------------------------------- Debridement Details Patient Name: Date of Service: [name], [name] 01/13/2023 12:45 PM Medical Record Number: [id number] Patient Account Number: [id number] Date of Birth/Sex: Treating RN: 14-May-1956 (67 y.o. [name] Primary Care Provider: Kathyrn Drown Other Clinician: Referring Provider: Treating Provider/Extender: Alean Rinne in Treatment: 9 Debridement Performed for Assessment: Wound #1 Right,Lateral Lower Leg Performed By: Physician [name], MD Debridement Type: Debridement Level of Consciousness (Pre-procedure): Awake and Alert Pre-procedure Verification/Time Out Yes - 13:10 Taken: Start Time: 13:10 Pain Control: Lidocaine 4% T opical Solution T Area Debrided (L x W): otal 5 (cm) x 1.1 (cm) = 5.5 (cm) Tissue and other material debrided: Non-Viable, Slough, Slough Level: Non-Viable Tissue Debridement Description: Selective/Open Wound Instrument: Curette Bleeding: Minimum Hemostasis Achieved: Pressure End Time: 13:11 Procedural Pain: 0 Post  Procedural Pain: 0 Response to Treatment: Procedure was tolerated well Level of Consciousness (Post- Awake and Alert procedure): Post Debridement Measurements of Total Wound Length: (cm) 5 Width: (cm) 1.1 Depth: (cm) 0.2 Volume: (cm) 0.864 Character of Wound/Ulcer Post Debridement: Improved Post Procedure Diagnosis Same as Pre-procedure Notes Scribed for Dr. Celine Ahr by J.Scotton Electronic Signature(s) Signed: 01/13/2023 4:05:29 PM By: [name] MD FACS Signed: 01/13/2023 4:41:04 PM By: Dellie Catholic RN [name] ([id number]) [phone].pdf Page 2 of 9 Entered By: Dellie Catholic on 01/13/2023 16:04:17 -------------------------------------------------------------------------------- HPI Details Patient Name: Date of Service: [name], [name] 01/13/2023 12:45 PM Medical Record Number: [id number] Patient Account Number: [id number] Date of Birth/Sex: Treating RN: 1956/05/23 (67 y.o. M) Primary Care Provider: Kathyrn Drown Other Clinician: Referring Provider: Treating Provider/Extender: Alean Rinne in Treatment: 9 History of Present Illness HPI Description: ADMISSION 11/07/2022 This is a 67 year old man with a past medical history significant for type 2 diabetes (last hemoglobin A1c 7.1), cerebrovascular occlusive disease, coronary artery disease, peripheral vascular disease, congestive heart failure, chronic pain syndrome, and prior amputation of his right great and second toes. He has a remote history of surgery on his right ankle and apparently as his legs have become increasingly more swollen and he has insisted upon wearing boots, the pressure and friction seem to have opened up wounds along the old scar line. In addition, he has a large callus on the third metatarsal head, related to his acquired foot deformity. He sees a podiatrist who periodically shaves this callus down and he was also recommended orthotics, but  the patient has refused them because he likes his boots. There is an area of discoloration in the lower portion of this callus, suspicious for an open wound. 11/14/2022: The ulcer on his third metatarsal head has closed. The lateral leg ulcers are both about the same size, but he says that they feel better. There is slough accumulation in both sites. 11/23/2022: The ulcer on  his foot remains closed. The lateral leg ulcers are still unchanged in size, but are a bit cleaner, just with some slough accumulation. 12/01/2022: No significant change to the size of the wounds on his lateral leg. The larger is quite clean with good granulation tissue present. The smaller still has fairly thick slough buildup. 12/09/2022: Both wounds are smaller today. They still have slough accumulation, but less so than at his previous visit. The callus on his foot has built up again and is uncomfortable. 12/16/2022: The wound at his ankle is nearly closed with just a little slough on the remaining open surface. The lateral leg wound is about the same size but has less slough accumulation. We are still working on Biochemist, clinical for wound VAC. 12/23/2022: The wound is ankle is down to just a pinhole with a small amount of slough and eschar present. The lateral leg wound has contracted in length and is also shallower. There is slough on the surface. We still have not secured insurance approval for his wound VAC. 01/05/2023: The ankle wound is healed underneath a layer of eschar. The lateral leg wound is smaller and has some slough accumulation. He was finally approved for a wound VAC and has it with him today. 01/13/2023: The wound depth has come in considerably and the overall wound size has contracted as well. There is just a little slough on the surface. He is having an excellent response to negative pressure wound therapy. Electronic Signature(s) Signed: 01/13/2023 1:19:40 PM By: [name] MD FACS Entered By: [name] on 01/13/2023 13:19:40 -------------------------------------------------------------------------------- Physical Exam Details Patient Name: Date of Service: [name], [name] 01/13/2023 12:45 PM Medical Record Number: [id number] Patient Account Number: [id number] Date of Birth/Sex: Treating RN: [birth year] (67 y.o. M) Primary Care Provider: Kathyrn Drown Other Clinician: Referring Provider: Treating Provider/Extender: Kristine Royal, Scott A Weeks in Treatment: 9 Constitutional Slightly hypertensive. . . . no acute distress. Respiratory Normal work of breathing on room air. Notes 01/13/2023: The wound depth has come in considerably and the overall wound size has contracted as well. There is just a little slough on the surface. Electronic Signature(s) Signed: 01/13/2023 1:20:23 PM By: [name] MD FACS Entered By: [name] on 01/13/2023 13:20:22 [name] ([id number]) [id number] Page 3 of 9 -------------------------------------------------------------------------------- Physician Orders Details Patient Name: Date of Service: [name] 01/13/2023 12:45 PM Medical Record Number: [id number] Patient Account Number: [id number] Date of Birth/Sex: Treating RN: [birth year] (67 y.o. [name] Primary Care Provider: Kathyrn Drown Other Clinician: Referring Provider: Treating Provider/Extender: Alean Rinne in Treatment: 9 Verbal / Phone Orders: No Diagnosis Coding ICD-10 Coding Code Description L97.819 Non-pressure chronic ulcer of other part of right lower leg with unspecified severity E11.622 Type 2 diabetes mellitus with other skin ulcer [id number] Chronic systolic (congestive) heart failure I73.9 Peripheral vascular disease, unspecified E11.42 Type 2 diabetes mellitus with diabetic polyneuropathy I63.9 Cerebral infarction, unspecified I10 Essential (primary) hypertension G89.4  Chronic pain syndrome L84 Corns and callosities Follow-up Appointments ppointment in 1 week. - Dr. Celine Ahr Room 3 Return A Wound Vac change either Thursday or Friday Anesthetic Wound #1 Right,Lateral Lower Leg (In clinic) Topical Lidocaine 4% applied to wound bed Bathing/ Shower/ Hygiene May shower with protection but do not get wound dressing(s) wet. Protect dressing(s) with water repellant cover (for example, large plastic bag) or a cast cover and may then take shower. - May purchase cast protector at [facility], [place],  or [facility] Negative Presssure Wound Therapy Wound #1 Right,Lateral Lower Leg Wound Vac to wound continuously at 126mm/hg pressure - RUN IVR WOUND VAC for Right Lateral leg-done Black Foam Edema Control - Lymphedema / SCD / Other Right Lower Extremity Elevate legs to the level of the heart or above for 30 minutes daily and/or when sitting for 3-4 times a day throughout the day. Avoid standing for long periods of time. Off-Loading Other: - Wear prescribed foot wear (Orthotics). Do not wear the boots. Home Health Admit to [facility] for skilled nursing wound care. May utilize formulary equivalent dressing for wound treatment orders unless otherwise specified. - For wound vac change 1 x week (the other wound vac change will be performed at the [facility] 1 x week) Wound Treatment Wound #1 - Lower Leg Wound Laterality: Right, Lateral Cleanser: Soap and Water 1 x Per Week/30 Days Discharge Instructions: May shower and wash wound with dial antibacterial soap and water prior to dressing change. Cleanser: Wound Cleanser 1 x Per Week/30 Days Discharge Instructions: Cleanse the wound with wound cleanser prior to applying a clean dressing using gauze sponges, not tissue or cotton balls. Peri-Wound Care: Sween Lotion (Moisturizing lotion) 1 x Per Week/30 Days Discharge Instructions: Apply moisturizing lotion as directed Prim Dressing: Sorbalgon AG Dressing 2x2 (in/in) 1 x Per Week/30  Days ary Discharge Instructions: Apply to wound bed as instructed Prim Dressing: WOUND VAC 1 x Per Week/30 Days ary Discharge Instructions: apply to wound in RLLL Secondary Dressing: Zetuvit Plus 4x8 in 1 x Per Week/30 Days RODRIC, HAMPER ([id number]) [id number] Page 4 of 9 Discharge Instructions: Apply over primary dressing as directed. Secured With: Transpore Surgical Tape, 2x10 (in/yd) 1 x Per Week/30 Days Discharge Instructions: Secure dressing with tape as directed. Compression Wrap: ThreePress (3 layer compression wrap) 1 x Per Week/30 Days Discharge Instructions: Apply three layer compression as directed. Electronic Signature(s) Signed: 01/13/2023 2:31:19 PM By: [name] MD FACS Signed: 01/13/2023 4:41:04 PM By: Dellie Catholic RN Entered By: Dellie Catholic on 01/13/2023 13:45:56 -------------------------------------------------------------------------------- Problem List Details Patient Name: Date of Service: [place], [name] 01/13/2023 12:45 PM Medical Record Number: [id number] Patient Account Number: [id number] Date of Birth/Sex: Treating RN: March 21, 1956 (67 y.o. M) Primary Care Provider: Kathyrn Drown Other Clinician: Referring Provider: Treating Provider/Extender: Alean Rinne in Treatment: 9 Active Problems ICD-10 Encounter Code Description Active Date MDM Diagnosis L97.819 Non-pressure chronic ulcer of other part of right lower leg with unspecified 11/07/2022 No Yes severity E11.622 Type 2 diabetes mellitus with other skin ulcer 11/07/2022 No Yes [id number] Chronic systolic (congestive) heart failure 11/07/2022 No Yes I73.9 Peripheral vascular disease, unspecified 11/07/2022 No Yes E11.42 Type 2 diabetes mellitus with diabetic polyneuropathy 11/07/2022 No Yes I63.9 Cerebral infarction, unspecified 11/07/2022 No Yes I10 Essential (primary) hypertension 11/07/2022 No Yes G89.4 Chronic pain syndrome 11/07/2022 No  Yes L84 Corns and callosities 12/09/2022 No Yes Inactive Problems ICD-10 Code Description Active Date Inactive Date L97.512 Non-pressure chronic ulcer of other part of right foot with fat layer exposed 11/07/2022 11/07/2022 [name] ([id number]) [phone].pdf Page 5 of 9 E11.621 Type 2 diabetes mellitus with foot ulcer 11/07/2022 11/07/2022 Resolved Problems ICD-10 Code Description Active Date Resolved Date L97.312 Non-pressure chronic ulcer of right ankle with fat layer exposed 11/07/2022 11/07/2022 Electronic Signature(s) Signed: 01/13/2023 1:18:51 PM By: [name] MD FACS Entered By: [name] on 01/13/2023 13:18:50 -------------------------------------------------------------------------------- Progress Note Details Patient Name: Date of Service: [name], [name] 01/13/2023 12:45  PM Medical Record Number: [id number] Patient Account Number: [id number] Date of Birth/Sex: Treating RN: 1955-11-25 (67 y.o. M) Primary Care Provider: Kathyrn Drown Other Clinician: Referring Provider: Treating Provider/Extender: Alean Rinne in Treatment: 9 Subjective Chief Complaint Information obtained from Patient Patients presents for treatment of an open diabetic ulcer on his right foot, as well as ulcers on his right lower leg and ankle History of Present Illness (HPI) ADMISSION 11/07/2022 This is a 67 year old man with a past medical history significant for type 2 diabetes (last hemoglobin A1c 7.1), cerebrovascular occlusive disease, coronary artery disease, peripheral vascular disease, congestive heart failure, chronic pain syndrome, and prior amputation of his right great and second toes. He has a remote history of surgery on his right ankle and apparently as his legs have become increasingly more swollen and he has insisted upon wearing boots, the pressure and friction seem to have opened up wounds along the old scar line. In  addition, he has a large callus on the third metatarsal head, related to his acquired foot deformity. He sees a podiatrist who periodically shaves this callus down and he was also recommended orthotics, but the patient has refused them because he likes his boots. There is an area of discoloration in the lower portion of this callus, suspicious for an open wound. 11/14/2022: The ulcer on his third metatarsal head has closed. The lateral leg ulcers are both about the same size, but he says that they feel better. There is slough accumulation in both sites. 11/23/2022: The ulcer on his foot remains closed. The lateral leg ulcers are still unchanged in size, but are a bit cleaner, just with some slough accumulation. 12/01/2022: No significant change to the size of the wounds on his lateral leg. The larger is quite clean with good granulation tissue present. The smaller still has fairly thick slough buildup. 12/09/2022: Both wounds are smaller today. They still have slough accumulation, but less so than at his previous visit. The callus on his foot has built up again and is uncomfortable. 12/16/2022: The wound at his ankle is nearly closed with just a little slough on the remaining open surface. The lateral leg wound is about the same size but has less slough accumulation. We are still working on Biochemist, clinical for wound VAC. 12/23/2022: The wound is ankle is down to just a pinhole with a small amount of slough and eschar present. The lateral leg wound has contracted in length and is also shallower. There is slough on the surface. We still have not secured insurance approval for his wound VAC. 01/05/2023: The ankle wound is healed underneath a layer of eschar. The lateral leg wound is smaller and has some slough accumulation. He was finally approved for a wound VAC and has it with him today. 01/13/2023: The wound depth has come in considerably and the overall wound size has contracted as well. There is just a  little slough on the surface. He is having an excellent response to negative pressure wound therapy. Patient History Family History Cancer - Father,Mother,Siblings, Diabetes - Mother,Siblings, Heart Disease, Lung Disease - Father, No family history of Hereditary Spherocytosis, Hypertension, Kidney Disease, Seizures, Stroke, Thyroid Problems, Tuberculosis. Social History Former smoker - ended on 12/23/1970, Marital Status - Married, Alcohol Use - Never, Drug Use - No History, Caffeine Use - Daily. Medical History Eyes Denies history of Cataracts, Glaucoma, Optic Neuritis Ear/Nose/Mouth/Throat MOSTYN, BUETER ([id number]) [phone].pdf Page 6 of 9 Denies history of Chronic sinus problems/congestion, Middle  ear problems Respiratory Denies history of Aspiration, Asthma, Chronic Obstructive Pulmonary Disease (COPD), Pneumothorax, Sleep Apnea, Tuberculosis Cardiovascular Patient has history of Congestive Heart Failure, Hypertension, Peripheral Venous Disease Endocrine Patient has history of Type II Diabetes Musculoskeletal Patient has history of Osteoarthritis Neurologic Patient has history of Neuropathy Oncologic Denies history of Received Chemotherapy, Received Radiation Psychiatric Denies history of Anorexia/bulimia, Confinement Anxiety Medical A Surgical History Notes nd Cardiovascular cardiomyopathy hyperlipidemia hypokalemia stroke Genitourinary orchitis Objective Constitutional Slightly hypertensive. no acute distress. Vitals Time Taken: 12:50 PM, Height: 74 in, Weight: 252 lbs, BMI: 32.4, Temperature: 98 F, Pulse: 87 bpm, Respiratory Rate: 16 breaths/min, Blood Pressure: 146/83 mmHg. Respiratory Normal work of breathing on room air. General Notes: 01/13/2023: The wound depth has come in considerably and the overall wound size has contracted as well. There is just a little slough on the surface. Integumentary (Hair, Skin) Wound #1 status is Open.  Original cause of wound was Shear/Friction. The date acquired was: 09/16/2022. The wound has been in treatment 9 weeks. The wound is located on the Right,Lateral Lower Leg. The wound measures 5cm length x 1.1cm width x 0.2cm depth; 4.32cm^2 area and 0.864cm^3 volume. There is Fat Layer (Subcutaneous Tissue) exposed. There is no tunneling or undermining noted. There is a medium amount of serosanguineous drainage noted. There is large (67-100%) red, hyper - granulation within the wound bed. There is a small (1-33%) amount of necrotic tissue within the wound bed including Adherent Slough. The periwound skin appearance had no abnormalities noted for moisture. The periwound skin appearance exhibited: Scarring, Hemosiderin Staining. Periwound temperature was noted as No Abnormality. Assessment Active Problems ICD-10 Non-pressure chronic ulcer of other part of right lower leg with unspecified severity Type 2 diabetes mellitus with other skin ulcer Chronic systolic (congestive) heart failure Peripheral vascular disease, unspecified Type 2 diabetes mellitus with diabetic polyneuropathy Cerebral infarction, unspecified Essential (primary) hypertension Chronic pain syndrome Corns and callosities Procedures Wound #1 Pre-procedure diagnosis of Wound #1 is a Trauma, Other located on the Right,Lateral Lower Leg . There was a Selective/Open Wound Non-Viable Tissue Debridement with a total area of 5.5 sq cm performed by [name], MD. With the following instrument(s): Curette to remove Non-Viable tissue/material. Material removed includes [facility] after achieving pain control using Lidocaine 4% Topical Solution. No specimens were taken. A time out was conducted at 13:10, prior to the start of the procedure. A Minimum amount of bleeding was controlled with Pressure. The procedure was tolerated well with a pain level of 0 throughout and a pain level of 0 following the procedure. Post Debridement  Measurements: 5cm length x 1.1cm width x 0.2cm depth; 0.864cm^3 volume. Character of Wound/Ulcer Post Debridement is improved. CYRON, HOAG ([id number]) [id number] Page 7 of 9 Post procedure Diagnosis Wound #1: Same as Pre-Procedure General Notes: Scribed for Dr. Celine Ahr by J.Scotton. Plan Follow-up Appointments: Return Appointment in 1 week. - Dr. Celine Ahr Room 3 Wound Vac change either Thursday or Friday Anesthetic: Wound #1 Right,Lateral Lower Leg: (In clinic) Topical Lidocaine 4% applied to wound bed Bathing/ Shower/ Hygiene: May shower with protection but do not get wound dressing(s) wet. Protect dressing(s) with water repellant cover (for example, large plastic bag) or a cast cover and may then take shower. - May purchase cast protector at [facility], [facility], or [facility] Negative Presssure Wound Therapy: Wound #1 Right,Lateral Lower Leg: Wound Vac to wound continuously at 139mm/hg pressure - RUN IVR WOUND VAC for Right Lateral leg-done Black Foam Edema Control - Lymphedema / SCD / Other:  Elevate legs to the level of the heart or above for 30 minutes daily and/or when sitting for 3-4 times a day throughout the day. Avoid standing for long periods of time. Off-Loading: Other: - Wear prescribed foot wear (Orthotics). Do not wear the boots. Home Health: Admit to Home Health for skilled nursing wound care. May utilize formulary equivalent dressing for wound treatment orders unless otherwise specified. - For wound vac change 1 x week (the other wound vac change will be performed at the [facility] 1 x week) WOUND #1: - Lower Leg Wound Laterality: Right, Lateral Cleanser: Soap and Water 1 x Per Week/30 Days Discharge Instructions: May shower and wash wound with dial antibacterial soap and water prior to dressing change. Cleanser: Wound Cleanser 1 x Per Week/30 Days Discharge Instructions: Cleanse the wound with wound cleanser prior to applying a clean dressing using gauze  sponges, not tissue or cotton balls. Peri-Wound Care: Sween Lotion (Moisturizing lotion) 1 x Per Week/30 Days Discharge Instructions: Apply moisturizing lotion as directed Prim Dressing: Sorbalgon AG Dressing 2x2 (in/in) 1 x Per Week/30 Days ary Discharge Instructions: Apply to wound bed as instructed Prim Dressing: WOUND VAC 1 x Per Week/30 Days ary Discharge Instructions: apply to wound in RLLL Secondary Dressing: Zetuvit Plus 4x8 in 1 x Per Week/30 Days Discharge Instructions: Apply over primary dressing as directed. Secured With: Transpore Surgical T ape, 2x10 (in/yd) 1 x Per Week/30 Days Discharge Instructions: Secure dressing with tape as directed. Com pression Wrap: ThreePress (3 layer compression wrap) 1 x Per Week/30 Days Discharge Instructions: Apply three layer compression as directed. 01/13/2023: The wound depth has come in considerably and the overall wound size has contracted as well. There is just a little slough on the surface. I used a curette to debride the slough from the wound surface. We will continue negative pressure wound therapy. Follow-up in 1 week. Electronic Signature(s) Signed: 01/16/2023 7:42:57 AM By: [name] MD FACS Previous Signature: 01/13/2023 1:21:21 PM Version By: [name] MD FACS Entered By: [name] on 01/16/2023 07:42:57 -------------------------------------------------------------------------------- HxROS Details Patient Name: Date of Service: [name], [name] 01/13/2023 12:45 PM Medical Record Number: [id number] Patient Account Number: [id number] Date of Birth/Sex: Treating RN: 30-Sep-1956 (67 y.o. M) Primary Care Provider: Kathyrn Drown Other Clinician: Referring Provider: Treating Provider/Extender: Alean Rinne in Treatment: 9 Eyes Medical History: Negative for: Cataracts; Glaucoma; Optic Neuritis Ear/Nose/Mouth/Throat Medical History: Negative for: Chronic sinus problems/congestion;  Middle ear problems [name], [name] ([id number]) [phone].pdf Page 8 of 9 Respiratory Medical History: Negative for: Aspiration; Asthma; Chronic Obstructive Pulmonary Disease (COPD); Pneumothorax; Sleep Apnea; Tuberculosis Cardiovascular Medical History: Positive for: Congestive Heart Failure; Hypertension; Peripheral Venous Disease Past Medical History Notes: cardiomyopathy hyperlipidemia hypokalemia stroke Endocrine Medical History: Positive for: Type II Diabetes Time with diabetes: 20 years Treated with: Insulin, Oral agents Genitourinary Medical History: Past Medical History Notes: orchitis Musculoskeletal Medical History: Positive for: Osteoarthritis Neurologic Medical History: Positive for: Neuropathy Oncologic Medical History: Negative for: Received Chemotherapy; Received Radiation Psychiatric Medical History: Negative for: Anorexia/bulimia; Confinement Anxiety Immunizations Pneumococcal Vaccine: Received Pneumococcal Vaccination: Yes Received Pneumococcal Vaccination On or After 60th Birthday: Yes Implantable Devices No devices added Family and Social History Cancer: Yes - Father,Mother,Siblings; Diabetes: Yes - Mother,Siblings; Heart Disease: Yes; Hereditary Spherocytosis: No; Hypertension: No; Kidney Disease: No; Lung Disease: Yes - Father; Seizures: No; Stroke: No; Thyroid Problems: No; Tuberculosis: No; Former smoker - ended on 12/23/1970; Marital Status - Married; Alcohol Use: Never; Drug Use: No  History; Caffeine Use: Daily; Financial Concerns: No; Food, Clothing or Shelter Needs: No; Support System Lacking: No; Transportation Concerns: No Engineer, maintenance) Signed: 01/13/2023 1:32:30 PM By: [name] MD FACS Entered By: [name] on 01/13/2023 13:19:57 -------------------------------------------------------------------------------- SuperBill Details Patient Name: Date of Service: [name], [name]  01/13/2023 Medical Record Number: [id number] Patient Account Number: [id number] Date of Birth/Sex: Treating RN: 09/28/1956 (67 y.o. M) Primary Care Provider: Kathyrn Drown Other Clinician: Referring Provider: Treating Provider/Extender: Coral Else ([id number]) [id number] Page 9 of 9 Weeks in Treatment: 9 Diagnosis Coding ICD-10 Codes Code Description L97.819 Non-pressure chronic ulcer of other part of right lower leg with unspecified severity E11.622 Type 2 diabetes mellitus with other skin ulcer [id number] Chronic systolic (congestive) heart failure I73.9 Peripheral vascular disease, unspecified E11.42 Type 2 diabetes mellitus with diabetic polyneuropathy I63.9 Cerebral infarction, unspecified I10 Essential (primary) hypertension G89.4 Chronic pain syndrome L84 Corns and callosities Facility Procedures : CPT4 Code: [id number] Description: [id number] - DEBRIDE WOUND 1ST 20 SQ CM OR < ICD-10 Diagnosis Description L97.819 Non-pressure chronic ulcer of other part of right lower leg with unspecified seve Modifier: rity Quantity: 1 : CPT4 Code: [id number] Description: [id number] - WOUND VAC-50 SQ CM OR LESS ICD-10 Diagnosis Description L97.819 Non-pressure chronic ulcer of other part of right lower leg with unspecified seve Modifier: rity Quantity: 1 Physician Procedures : CPT4 Code Description Modifier [id number] - WC PHYS LEVEL 4 - EST PT 25 ICD-10 Diagnosis Description L97.819 Non-pressure chronic ulcer of other part of right lower leg with unspecified severity E11.622 Type 2 diabetes mellitus with other skin ulcer  I73.9 Peripheral vascular disease, unspecified [id number] Chronic systolic (congestive) heart failure Quantity: 1 : [id number] - WC PHYS DEBR WO ANESTH 20 SQ CM ICD-10 Diagnosis Description L97.819 Non-pressure chronic ulcer of other part of right lower leg with unspecified severity Quantity: 1 Electronic  Signature(s) Signed: 01/16/2023 7:44:05 AM By: [name] MD FACS Previous Signature: 01/13/2023 1:21:47 PM Version By: [name] MD FACS Entered By: [name] on 01/16/2023 07:44:05

## 2023-01-14 NOTE — Progress Notes (Signed)
[name], [name] ([id number]) [id number] Page 1 of 7 Visit Report for 01/13/2023 Arrival Information Details Patient Name: Date of Service: [name], [name] 01/13/2023 12:45 PM Medical Record Number: [id number] Patient Account Number: [id number] Date of Birth/Sex: Treating RN: [birth year] (67 y.o. [name] Primary Care Rosalin Buster: Kathyrn Drown Other Clinician: Referring Yenifer Saccente: Treating Shyvonne Chastang/Extender: Alean Rinne in Treatment: 9 Visit Information History Since Last Visit Added or deleted any medications: No Patient Arrived: Ambulatory Any new allergies or adverse reactions: No Arrival Time: 12:45 Had a fall or experienced change in No Accompanied By: spouse activities of daily living that may affect Transfer Assistance: None risk of falls: Patient Identification Verified: Yes Signs or symptoms of abuse/neglect since last visito No Patient Requires Transmission-Based Precautions: No Hospitalized since last visit: No Patient Has Alerts: Yes Implantable device outside of the clinic excluding No Patient Alerts: ABI RLE Tensas cellular tissue based products placed in the center since last visit: Has Dressing in Place as Prescribed: Yes Pain Present Now: No Electronic Signature(s) Signed: 01/13/2023 4:41:04 PM By: Dellie Catholic RN Entered By: Dellie Catholic on 01/13/2023 13:16:40 -------------------------------------------------------------------------------- Encounter Discharge Information Details Patient Name: Date of Service: [name], [name] [name] D. 01/13/2023 12:45 PM Medical Record Number: [id number] Patient Account Number: [id number] Date of Birth/Sex: Treating RN: 01-24-1956 (67 y.o. [name] Primary Care Lakota Markgraf: Kathyrn Drown Other Clinician: Referring Dennisse Swader: Treating Logen Fowle/Extender: Alean Rinne in Treatment: 9 Encounter Discharge Information Items Post Procedure  Vitals Discharge Condition: Stable Temperature (F): 98 Ambulatory Status: Walker Pulse (bpm): 87 Discharge Destination: Home Respiratory Rate (breaths/min): 16 Transportation: Private Auto Blood Pressure (mmHg): 146/83 Accompanied By: spouse Schedule Follow-up Appointment: Yes Clinical Summary of Care: Patient Declined Electronic Signature(s) Signed: 01/13/2023 4:41:04 PM By: Dellie Catholic RN Entered By: Dellie Catholic on 01/13/2023 16:12:08 -------------------------------------------------------------------------------- Lower Extremity Assessment Details Patient Name: Date of Service: [name] 01/13/2023 12:45 PM Medical Record Number: [id number] Patient Account Number: [id number] Date of Birth/Sex: Treating RN: [birth year] (67 y.o. [name] Primary Care Hadley Detloff: Kathyrn Drown Other Clinician: Referring Trini Soldo: Treating Colbert Curenton/Extender: Hart Robinsons A Weeks in Treatment: 9 Edema Assessment Assessed: Shirlyn Goltz: No] Patrice Paradise: No] W[LeftADHRITH, WIERMAN [id number] [Right: [id number] Page 2 of 7] [Left: Edema] [Right: :] Calf Left: Right: Point of Measurement: From Medial Instep 38 cm Ankle Left: Right: Point of Measurement: From Medial Instep 26 cm Vascular Assessment Pulses: Dorsalis Pedis Palpable: [Right:Yes] Electronic Signature(s) Signed: 01/13/2023 4:41:04 PM By: Dellie Catholic RN Entered By: Dellie Catholic on 01/13/2023 13:18:16 -------------------------------------------------------------------------------- Multi Wound Chart Details Patient Name: Date of Service: [name], [name] [name] D. 01/13/2023 12:45 PM Medical Record Number: [id number] Patient Account Number: [id number] Date of Birth/Sex: Treating RN: [birth year] (67 y.o. M) Primary Care Legrand Lasser: Kathyrn Drown Other Clinician: Referring Breylin Dom: Treating Fatiha Guzy/Extender: Kristine Royal, Scott A Weeks in Treatment: 9 Vital  Signs Height(in): 74 Pulse(bpm): 87 Weight(lbs): 252 Blood Pressure(mmHg): 146/83 Body Mass Index(BMI): 32.4 Temperature(F): 98 Respiratory Rate(breaths/min): 16 [1:Photos:] [N/A:N/A] Right, Lateral Lower Leg N/A N/A Wound Location: Shear/Friction N/A N/A Wounding Event: Trauma, Other N/A N/A Primary Etiology: Congestive Heart Failure, N/A N/A Comorbid History: Hypertension, Peripheral Venous Disease, Type II Diabetes, Osteoarthritis, Neuropathy 09/16/2022 N/A N/A Date Acquired: 9 N/A N/A Weeks of Treatment: Open N/A N/A Wound Status: No N/A N/A Wound Recurrence: 5x1.1x0.2 N/A N/A Measurements L x W x D (cm) 4.32 N/A N/A A (cm) : rea 0.864 N/A N/A  Volume (cm) : -1.90% N/A N/A % Reduction in Area: 32.10% N/A N/A % Reduction in Volume: Full Thickness Without Exposed N/A N/A Classification: Support Structures Medium N/A N/A Exudate Amount: Serosanguineous N/A N/A Exudate Type: red, brown N/A N/A Exudate Color: Large (67-100%) N/A N/A Granulation Amount: Red, Hyper-granulation N/A N/A Granulation Quality: Small (1-33%) N/A N/A Necrotic Amount: Fat Layer (Subcutaneous Tissue): Yes N/A N/A Exposed Structures: [name] ([id number]) [id number] Page 3 of 7 Fascia: No Tendon: No Muscle: No Joint: No Bone: No Small (1-33%) N/A N/A Epithelialization: Scarring: Yes N/A N/A Periwound Skin Texture: No Abnormalities Noted N/A N/A Periwound Skin Moisture: Hemosiderin Staining: Yes N/A N/A Periwound Skin Color: No Abnormality N/A N/A Temperature: Treatment Notes Electronic Signature(s) Signed: 01/13/2023 1:19:00 PM By: [name] MD FACS Entered By: [name] on 01/13/2023 13:19:00 -------------------------------------------------------------------------------- Multi-Disciplinary Care Plan Details Patient Name: Date of Service: [name], [name] [name] D. 01/13/2023 12:45 PM Medical Record Number: [id number] Patient  Account Number: [id number] Date of Birth/Sex: Treating RN: April 24, 1956 (67 y.o. [name] Primary Care Tameshia Bonneville: Kathyrn Drown Other Clinician: Referring Catie Chiao: Treating Albaro Deviney/Extender: Alean Rinne in Treatment: 9 Multidisciplinary Care Plan reviewed with physician Active Inactive Venous Leg Ulcer Nursing Diagnoses: Knowledge deficit related to disease process and management Potential for venous Insuffiency (use before diagnosis confirmed) Goals: Patient will maintain optimal edema control Date Initiated: 11/23/2022 Target Resolution Date: 03/17/2023 Goal Status: Active Interventions: Assess peripheral edema status every visit. Compression as ordered Treatment Activities: Therapeutic compression applied : 11/23/2022 Notes: Wound/Skin Impairment Nursing Diagnoses: Impaired tissue integrity Goals: Patient/caregiver will verbalize understanding of skin care regimen Date Initiated: 11/23/2022 Target Resolution Date: 03/17/2023 Goal Status: Active Ulcer/skin breakdown will have a volume reduction of 30% by week 4 Date Initiated: 11/07/2022 Target Resolution Date: 03/17/2023 Goal Status: Active Interventions: Assess patient/caregiver ability to obtain necessary supplies Assess patient/caregiver ability to perform ulcer/skin care regimen upon admission and as needed Assess ulceration(s) every visit Treatment Activities: Skin care regimen initiated : 11/07/2022 Topical wound management initiated : 11/07/2022 JODEY, GESKE ([id number]) [id number].pdf Page 4 of 7 Notes: Electronic Signature(s) Signed: 01/13/2023 4:41:04 PM By: Dellie Catholic RN Entered By: Dellie Catholic on 01/13/2023 16:05:29 -------------------------------------------------------------------------------- Negative Pressure Wound Therapy Maintenance (NPWT) Details Patient Name: Date of Service: [name] 01/13/2023 12:45 PM Medical Record Number:  [id number] Patient Account Number: [id number] Date of Birth/Sex: Treating RN: 1956-04-27 (67 y.o. M) Primary Care Ebonee Stober: Kathyrn Drown Other Clinician: Referring Rielly Corlett: Treating Irl Bodie/Extender: Kristine Royal, Scott A Weeks in Treatment: 9 NPWT Maintenance Performed for: Wound #1 Right, Lateral Lower Leg Additional Injuries Covered: No Performed By: Dellie Catholic, RN Coverage Size (sq cm): 5.5 Pressure Type: Constant Pressure Setting: 125 mmHG Drain Type: None Primary Contact: Non-Adherent Sponge/Dressing Type: Foam- Black Date Initiated: 01/05/2023 Dressing Removed: No Quantity of Sponges/Gauze Removed: 1 Canister Changed: Yes Canister Exudate Volume: 10 Dressing Reapplied: Yes Quantity of Sponges/Gauze Inserted: 1 black Respones T Treatment: o tolerated well Days On NPWT : 9 Post Procedure Diagnosis Same as Pre-procedure Electronic Signature(s) Signed: 01/16/2023 7:43:34 AM By: [name] MD FACS Previous Signature: 01/13/2023 4:41:04 PM Version By: Dellie Catholic RN Entered By: [name] on 01/16/2023 07:43:34 -------------------------------------------------------------------------------- Pain Assessment Details Patient Name: Date of Service: Barbera Setters D. 01/13/2023 12:45 PM Medical Record Number: [id number] Patient Account Number: [id number] Date of Birth/Sex: Treating RN: [birth year] (67 y.o. [name] Primary Care Ayslin Kundert: Kathyrn Drown Other Clinician: Referring Tiawanna Luchsinger: Treating Sadrac Zeoli/Extender: Celine Ahr,  Candelaria Stagers, Scott A Weeks in Treatment: 9 Active Problems Location of Pain Severity and Description of Pain Patient Has Paino No Site Locations PRESIDENT, SCURTI ([id number]) [id number].pdf Page 5 of 7 Pain Management and Medication Current Pain Management: Electronic Signature(s) Signed: 01/13/2023 4:41:04 PM By: Dellie Catholic RN Entered By: Dellie Catholic on 01/13/2023  13:17:49 -------------------------------------------------------------------------------- Patient/Caregiver Education Details Patient Name: Date of Service: [name], [name]. 3/29/2024andnbsp12:45 PM Medical Record Number: [id number] Patient Account Number: [id number] Date of Birth/Gender: Treating RN: 11-12-1955 (67 y.o. [name] Primary Care Physician: Kathyrn Drown Other Clinician: Referring Physician: Treating Physician/Extender: Alean Rinne in Treatment: 9 Education Assessment Education Provided To: Patient Education Topics Provided Wound/Skin Impairment: Methods: Explain/Verbal Responses: Return demonstration correctly Electronic Signature(s) Signed: 01/13/2023 4:41:04 PM By: Dellie Catholic RN Entered By: Dellie Catholic on 01/13/2023 16:05:42 -------------------------------------------------------------------------------- Wound Assessment Details Patient Name: Date of Service: [name] 01/13/2023 12:45 PM Medical Record Number: [id number] Patient Account Number: [id number] Date of Birth/Sex: Treating RN: [birth year] (67 y.o. [name] Primary Care Mykell Rawl: Kathyrn Drown Other Clinician: Referring Azzan Butler: Treating Marquia Costello/Extender: Kristine Royal, Scott A Weeks in Treatment: 9 Wound Status Wound Number: 1 Primary Trauma, Other Etiology: Wound Location: Right, Lateral Lower Leg Wound Open Wounding Event: Shear/Friction Status: Date Acquired: 09/16/2022 Comorbid Congestive Heart Failure, Hypertension, Peripheral Venous Weeks Of Treatment: 9 History: Disease, Type II Diabetes, Osteoarthritis, Neuropathy Clustered Wound: No JAIMAR, DEMATTOS ([id number]) [id number] Page 6 of 7 Photos Wound Measurements Length: (cm) 5 Width: (cm) 1.1 Depth: (cm) 0.2 Area: (cm) 4.32 Volume: (cm) 0.864 % Reduction in Area: -1.9% % Reduction in Volume: 32.1% Epithelialization: Small  (1-33%) Tunneling: No Undermining: No Wound Description Classification: Full Thickness Without Exposed Support Structures Exudate Amount: Medium Exudate Type: Serosanguineous Exudate Color: red, brown Foul Odor After Cleansing: No Slough/Fibrino Yes Wound Bed Granulation Amount: Large (67-100%) Exposed Structure Granulation Quality: Red, Hyper-granulation Fascia Exposed: No Necrotic Amount: Small (1-33%) Fat Layer (Subcutaneous Tissue) Exposed: Yes Necrotic Quality: Adherent Slough Tendon Exposed: No Muscle Exposed: No Joint Exposed: No Bone Exposed: No Periwound Skin Texture Texture Color No Abnormalities Noted: No No Abnormalities Noted: No Scarring: Yes Hemosiderin Staining: Yes Moisture Temperature / Pain No Abnormalities Noted: Yes Temperature: No Abnormality Treatment Notes Wound #1 (Lower Leg) Wound Laterality: Right, Lateral Cleanser Soap and Water Discharge Instruction: May shower and wash wound with dial antibacterial soap and water prior to dressing change. Wound Cleanser Discharge Instruction: Cleanse the wound with wound cleanser prior to applying a clean dressing using gauze sponges, not tissue or cotton balls. Peri-Wound Care Sween Lotion (Moisturizing lotion) Discharge Instruction: Apply moisturizing lotion as directed Topical Primary Dressing Sorbalgon AG Dressing 2x2 (in/in) Discharge Instruction: Apply to wound bed as instructed WOUND VAC Discharge Instruction: apply to wound in RLLL Secondary Dressing Zetuvit Plus 4x8 in Discharge Instruction: Apply over primary dressing as directed. Secured With Transpore Surgical Tape, 2x10 (in/yd) Discharge Instruction: Secure dressing with tape as directed. SHANNE, HAMS ([id number]) [id number] Page 7 of 7 Compression Wrap ThreePress (3 layer compression wrap) Discharge Instruction: Apply three layer compression as directed. Compression Stockings Add-Ons Electronic  Signature(s) Signed: 01/13/2023 4:41:04 PM By: Dellie Catholic RN Entered By: Dellie Catholic on 01/13/2023 13:08:01 -------------------------------------------------------------------------------- Vitals Details Patient Name: Date of Service: [place] [name], [name] [name] D. 01/13/2023 12:45 PM Medical Record Number: [id number] Patient Account Number: [id number] Date of Birth/Sex: Treating RN: 02/11/1956 (67 y.o. [name] Primary Care Tykerria Mccubbins: Kathyrn Drown Other  Clinician: Referring Christerpher Clos: Treating Marwah Disbro/Extender: Kristine Royal, Scott A Weeks in Treatment: 9 Vital Signs Time Taken: 12:50 Temperature (F): 98 Height (in): 74 Pulse (bpm): 87 Weight (lbs): 252 Respiratory Rate (breaths/min): 16 Body Mass Index (BMI): 32.4 Blood Pressure (mmHg): 146/83 Reference Range: 80 - 120 mg / dl Electronic Signature(s) Signed: 01/13/2023 4:41:04 PM By: Dellie Catholic RN Entered By: Dellie Catholic on 01/13/2023 13:17:33

## 2023-01-16 NOTE — Telephone Encounter (Signed)
All three scans were sent to [facility] in January and the patient did not return their call to schedule. I sent a message to GI asking them to call the patient again.

## 2023-01-19 ENCOUNTER — Encounter (HOSPITAL_BASED_OUTPATIENT_CLINIC_OR_DEPARTMENT_OTHER): Payer: Medicare HMO | Attending: General Surgery | Admitting: General Surgery

## 2023-01-19 DIAGNOSIS — L84 Corns and callosities: Secondary | ICD-10-CM | POA: Insufficient documentation

## 2023-01-19 DIAGNOSIS — L97919 Non-pressure chronic ulcer of unspecified part of right lower leg with unspecified severity: Secondary | ICD-10-CM | POA: Diagnosis not present

## 2023-01-19 DIAGNOSIS — I2510 Atherosclerotic heart disease of native coronary artery without angina pectoris: Secondary | ICD-10-CM | POA: Insufficient documentation

## 2023-01-19 DIAGNOSIS — L97312 Non-pressure chronic ulcer of right ankle with fat layer exposed: Secondary | ICD-10-CM | POA: Diagnosis not present

## 2023-01-19 DIAGNOSIS — E1142 Type 2 diabetes mellitus with diabetic polyneuropathy: Secondary | ICD-10-CM | POA: Insufficient documentation

## 2023-01-19 DIAGNOSIS — L97512 Non-pressure chronic ulcer of other part of right foot with fat layer exposed: Secondary | ICD-10-CM | POA: Insufficient documentation

## 2023-01-19 DIAGNOSIS — I5022 Chronic systolic (congestive) heart failure: Secondary | ICD-10-CM | POA: Insufficient documentation

## 2023-01-19 DIAGNOSIS — E1151 Type 2 diabetes mellitus with diabetic peripheral angiopathy without gangrene: Secondary | ICD-10-CM | POA: Diagnosis not present

## 2023-01-19 DIAGNOSIS — E11622 Type 2 diabetes mellitus with other skin ulcer: Secondary | ICD-10-CM | POA: Diagnosis not present

## 2023-01-19 DIAGNOSIS — I429 Cardiomyopathy, unspecified: Secondary | ICD-10-CM | POA: Insufficient documentation

## 2023-01-19 DIAGNOSIS — E11621 Type 2 diabetes mellitus with foot ulcer: Secondary | ICD-10-CM | POA: Insufficient documentation

## 2023-01-19 DIAGNOSIS — Z833 Family history of diabetes mellitus: Secondary | ICD-10-CM | POA: Insufficient documentation

## 2023-01-19 DIAGNOSIS — I110 Hypertensive heart disease with heart failure: Secondary | ICD-10-CM | POA: Diagnosis not present

## 2023-01-19 DIAGNOSIS — G894 Chronic pain syndrome: Secondary | ICD-10-CM | POA: Diagnosis not present

## 2023-01-19 DIAGNOSIS — S81801A Unspecified open wound, right lower leg, initial encounter: Secondary | ICD-10-CM | POA: Diagnosis not present

## 2023-01-20 NOTE — Progress Notes (Signed)
[name], [name] ([id number]) [id number] Page 1 of 9 Visit Report for 01/19/2023 Chief Complaint Document Details Patient Name: Date of Service: [place] 01/19/2023 2:15 PM Medical Record Number: [id number] Patient Account Number: [id number] Date of Birth/Sex: Treating RN: 1956/04/15 (67 y.o. M) Primary Care Provider: Babs Sciara Other Clinician: Referring Provider: Treating Provider/Extender: Myriam Jacobson in Treatment: 10 Information Obtained from: Patient Chief Complaint Patients presents for treatment of an open diabetic ulcer on his right foot, as well as ulcers on his right lower leg and ankle Electronic Signature(s) Signed: 01/19/2023 3:13:22 PM By: Duanne Guess MD FACS Entered By: Duanne Guess on 01/19/2023 15:13:22 -------------------------------------------------------------------------------- Debridement Details Patient Name: Date of Service: [name], [name] Niemann [name]. 01/19/2023 2:15 PM Medical Record Number: [id number] Patient Account Number: [id number] Date of Birth/Sex: Treating RN: 1956-02-10 (67 y.o. Dianna Limbo Primary Care Provider: Babs Sciara Other Clinician: Referring Provider: Treating Provider/Extender: Myriam Jacobson in Treatment: 10 Debridement Performed for Assessment: Wound #1 Right,Lateral Lower Leg Performed By: Physician Duanne Guess, MD Debridement Type: Debridement Level of Consciousness (Pre-procedure): Awake and Alert Pre-procedure Verification/Time Out Yes - 15:05 Taken: Start Time: 15:05 Pain Control: Lidocaine 5% topical ointment T Area Debrided (L x W): otal 4.9 (cm) x 0.7 (cm) = 3.43 (cm) Tissue and other material debrided: Non-Viable, Slough, Slough Level: Non-Viable Tissue Debridement Description: Selective/Open Wound Instrument: Curette Bleeding: Minimum Hemostasis Achieved: Pressure End Time: 15:07 Procedural Pain: 0 Post Procedural  Pain: 0 Response to Treatment: Procedure was tolerated well Level of Consciousness (Post- Awake and Alert procedure): Post Debridement Measurements of Total Wound Length: (cm) 4.9 Width: (cm) 0.7 Depth: (cm) 0.2 Volume: (cm) 0.539 Character of Wound/Ulcer Post Debridement: Improved Post Procedure Diagnosis Same as Pre-procedure Notes Scribed for Dr. Lady Gary by J.Scotton Electronic Signature(s) Signed: 01/19/2023 3:30:44 PM By: Duanne Guess MD FACS Signed: 01/19/2023 5:30:29 PM By: Karie Schwalbe RN [name] [place] ([id number]) [phone].pdf Page 2 of 9 Entered By: Karie Schwalbe on 01/19/2023 15:10:16 -------------------------------------------------------------------------------- HPI Details Patient Name: Date of Service: [name], [name] 01/19/2023 2:15 PM Medical Record Number: [id number] Patient Account Number: [id number] Date of Birth/Sex: Treating RN: [birth year] (67 y.o. 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 He has a remote history of surgery on his right ankle and apparently as his legs have become increasingly more swollen and he has insisted upon wearing boots, the pressure and friction seem to have opened up wounds along the old scar line. In addition, he has a large callus on the third metatarsal head, related to his acquired foot deformity. He sees a podiatrist who periodically shaves this callus down and he was also recommended orthotics, but the patient  has refused them because he likes his boots. There is an area of discoloration in the lower portion of this callus, suspicious for an open wound. 11/14/2022: The ulcer on his third metatarsal head has closed. The lateral leg ulcers are both about the same size, but he says that they feel better. There is slough accumulation in both sites. 11/23/2022: The ulcer on his  foot remains closed. The lateral leg ulcers are still unchanged in size, but are a bit cleaner, just with some slough accumulation. 12/01/2022: No significant change to the size of the wounds on his lateral leg. The larger is quite clean with good granulation tissue present. The smaller still has fairly thick slough buildup. 12/09/2022: Both wounds are smaller today. They still have slough accumulation, but less so than at his previous visit. The callus on his foot has built up again and is uncomfortable. 12/16/2022: The wound at his ankle is nearly closed with just a little slough on the remaining open surface. The lateral leg wound is about the same size but has less slough accumulation. We are still working on Therapist, occupational for wound VAC. 12/23/2022: The wound is ankle is down to just a pinhole with a small amount of slough and eschar present. The lateral leg wound has contracted in length and is also shallower. There is slough on the surface. We still have not secured insurance approval for his wound VAC. 01/05/2023: The ankle wound is healed underneath a layer of eschar. The lateral leg wound is smaller and has some slough accumulation. He was finally approved for a wound VAC and has it with him today. 01/13/2023: The wound depth has come in considerably and the overall wound size has contracted as well. There is just a little slough on the surface. He is having an excellent response to negative pressure wound therapy. 01/19/2023: The wound is smaller and shallower again today. There is some slough accumulation on the surface. Electronic  Signature(s) Signed: 01/19/2023 3:13:48 PM By: Duanne Guess MD FACS Entered By: Duanne Guess on 01/19/2023 15:13:47 -------------------------------------------------------------------------------- Physical Exam Details Patient Name: Date of Service: [name], [name] Niemann [name]. 01/19/2023 2:15 PM Medical Record Number: [id number] Patient Account Number: [id number] Date of Birth/Sex: Treating RN: 17-Jun-1956 (67 y.o. M) Primary Care Provider: Babs Sciara Other Clinician: Referring Provider: Treating Provider/Extender: Marchelle Folks, Scott A Weeks in Treatment: 10 Constitutional . . . . no acute distress. Respiratory . Notes 01/19/2023: The wound is smaller and shallower again today. There is some slough accumulation on the surface. Electronic Signature(s) Signed: 01/19/2023 3:14:13 PM By: Duanne Guess MD Marin Shutter ([id number]) [id number] Page 3 of 9 Entered By: Duanne Guess on 01/19/2023 15:14:13 -------------------------------------------------------------------------------- Physician Orders Details Patient Name: Date of Service: [name] 01/19/2023 2:15 PM Medical Record Number: [id number] Patient Account Number: [id number] Date of Birth/Sex: Treating RN: 08/15/1956 (67 y.o. Dianna Limbo Primary Care Provider: Babs Sciara Other Clinician: Referring Provider: Treating Provider/Extender: Myriam Jacobson in Treatment: 10 Verbal / Phone Orders: No Diagnosis Coding ICD-10 Coding Code Description L97.819 Non-pressure chronic ulcer of other part of right lower leg with unspecified severity E11.622 Type 2 diabetes mellitus with other skin ulcer I50.22 Chronic systolic (congestive) heart failure I73.9 Peripheral vascular disease, unspecified E11.42 Type 2 diabetes mellitus with diabetic polyneuropathy I63.9 Cerebral infarction, unspecified I10 Essential (primary) hypertension G89.4 Chronic  pain syndrome L84 Corns and callosities Follow-up Appointments ppointment in 1 week. - Dr. Lady Gary Room 3 Return A Wound Vac change either Thursday or Friday Anesthetic Wound #1 Right,Lateral Lower Leg (In clinic) Topical Lidocaine 4% applied to wound bed Bathing/ Shower/ Hygiene May shower with protection but do not get wound dressing(s) wet. Protect dressing(s) with water repellant cover (for example, large plastic bag) or a cast cover and may then take shower. - May purchase cast protector at [facility], [facility],  or [facility] Negative Presssure Wound Therapy Wound #1 Right,Lateral Lower Leg Wound Vac to wound continuously at 12025mm/hg pressure - RUN IVR WOUND VAC for Right Lateral leg-done Black Foam Edema Control - Lymphedema / SCD / Other Right Lower Extremity Elevate legs to the level of the heart or above for 30 minutes daily and/or when sitting for 3-4 times a day throughout the day. Avoid standing for long periods of time. Off-Loading Other: - Wear prescribed foot wear (Orthotics). Do not wear the boots. Home Health Admit to Home Health for skilled nursing wound care. May utilize formulary equivalent dressing for wound treatment orders unless otherwise specified. - For wound vac change 1 x week ([facility] Other Home Health Orders/Instructions: - [facility]. Please change wound vac 1 x week and the 2nd wound vac change will happen at the Wound Clinic Wound Treatment Wound #1 - Lower Leg Wound Laterality: Right, Lateral Cleanser: Soap and Water 1 x Per Week/30 Days Discharge Instructions: May shower and wash wound with dial antibacterial soap and water prior to dressing change. Cleanser: Wound Cleanser 1 x Per Week/30 Days Discharge Instructions: Cleanse the wound with wound cleanser prior to applying a clean dressing using gauze sponges, not tissue or cotton balls. Peri-Wound Care: Sween Lotion (Moisturizing lotion) 1 x  Per Week/30 Days Discharge Instructions: Apply moisturizing lotion as directed Prim Dressing: Sorbalgon AG Dressing 2x2 (in/in) 1 x Per Week/30 Days ary Discharge Instructions: Apply to wound bed if not using the wound vac [name], [name] ([id number]) [id number] Page 4 of 9 Prim Dressing: WOUND VAC 1 x Per Week/30 Days ary Discharge Instructions: apply to wound in RLLL Secondary Dressing: Zetuvit Plus 4x8 in 1 x Per Week/30 Days Discharge Instructions: Apply over primary dressing as directed. Secured With: Transpore Surgical Tape, 2x10 (in/yd) 1 x Per Week/30 Days Discharge Instructions: Secure dressing with tape as directed. Compression Wrap: ThreePress (3 layer compression wrap) 1 x Per Week/30 Days Discharge Instructions: Apply three layer compression as directed. Electronic Signature(s) Signed: 01/19/2023 3:30:44 PM By: Duanne Guessannon, Narayan Scull MD FACS Entered By: Duanne Guessannon, Afrika Brick on 01/19/2023 15:14:38 -------------------------------------------------------------------------------- Problem List Details Patient Name: Date of Service: [name], [name] NiemannWILLIA M [name]. 01/19/2023 2:15 PM Medical Record Number: [id number] Patient Account Number: [id number] Date of Birth/Sex: Treating RN: [birth year] (67 y.o. M) Primary Care Provider: Babs SciaraLuking, Scott A Other Clinician: Referring Provider: Treating Provider/Extender: Myriam Jacobsonannon, Annaelle Kasel Luking, Scott A Weeks in Treatment: 10 Active Problems ICD-10 Encounter Code Description Active Date MDM Diagnosis L97.819 Non-pressure chronic ulcer of other part of right lower leg with unspecified 11/07/2022 No Yes severity E11.622 Type 2 diabetes mellitus with other skin ulcer 11/07/2022 No Yes I50.22 Chronic systolic (congestive) heart failure 11/07/2022 No Yes I73.9 Peripheral vascular disease, unspecified 11/07/2022 No Yes E11.42 Type 2 diabetes mellitus with diabetic polyneuropathy 11/07/2022 No Yes I63.9 Cerebral infarction, unspecified 11/07/2022  No Yes I10 Essential (primary) hypertension 11/07/2022 No Yes G89.4 Chronic pain syndrome 11/07/2022 No Yes L84 Corns and callosities 12/09/2022 No Yes Inactive Problems [name], [name] ([id number]) [id number] Page 5 of 9 ICD-10 Code Description Active Date Inactive Date L97.512 Non-pressure chronic ulcer of other part of right foot with fat layer exposed 11/07/2022 11/07/2022 Q03.47411.621 Type 2 diabetes mellitus with foot ulcer 11/07/2022 11/07/2022 Resolved Problems ICD-10 Code Description Active Date Resolved Date L97.312 Non-pressure chronic ulcer of right ankle with fat layer exposed 11/07/2022 11/07/2022 Electronic Signature(s) Signed: 01/19/2023 3:13:06 PM By: Duanne Guessannon, Farheen Pfahler MD FACS  Entered By: Duanne Guess on 01/19/2023 15:13:06 -------------------------------------------------------------------------------- Progress Note Details Patient Name: Date of Service: [name] 01/19/2023 2:15 PM Medical Record Number: [id number] Patient Account Number: [id number] Date of Birth/Sex: Treating RN: [birth year] (67 y.o. M) Primary Care Provider: Babs Sciara Other Clinician: Referring Provider: Treating Provider/Extender: Myriam Jacobson in Treatment: 10 Subjective Chief Complaint Information obtained from Patient Patients presents for treatment of an open diabetic ulcer on his right foot, as well as ulcers on his right lower leg and ankle History of Present Illness (HPI) ADMISSION 11/07/2022 This is a 67 year old man with a past medical history significant for type 2 diabetes (last hemoglobin A1c 7.1), cerebrovascular occlusive disease, coronary artery disease, peripheral vascular disease, congestive heart failure, chronic pain syndrome, and prior amputation of his right great and second toes. He has a remote history of surgery on his right ankle and apparently as his legs have become increasingly more swollen and he has insisted upon  wearing boots, the pressure and friction seem to have opened up wounds along the old scar line. In addition, he has a large callus on the third metatarsal head, related to his acquired foot deformity. He sees a podiatrist who periodically shaves this callus down and he was also recommended orthotics, but the patient has refused them because he likes his boots. There is an area of discoloration in the lower portion of this callus, suspicious for an open wound. 11/14/2022: The ulcer on his third metatarsal head has closed. The lateral leg ulcers are both about the same size, but he says that they feel better. There is slough accumulation in both sites. 11/23/2022: The ulcer on his foot remains closed. The lateral leg ulcers are still unchanged in size, but are a bit cleaner, just with some slough accumulation. 12/01/2022: No significant change to the size of the wounds on his lateral leg. The larger is quite clean with good granulation tissue present. The smaller still has fairly thick slough buildup. 12/09/2022: Both wounds are smaller today. They still have slough accumulation, but less so than at his previous visit. The callus on his foot has built up again and is uncomfortable. 12/16/2022: The wound at his ankle is nearly closed with just a little slough on the remaining open surface. The lateral leg wound is about the same size but has less slough accumulation. We are still working on Therapist, occupational for wound VAC. 12/23/2022: The wound is ankle is down to just a pinhole with a small amount of slough and eschar present. The lateral leg wound has contracted in length and is also shallower. There is slough on the surface. We still have not secured insurance approval for his wound VAC. 01/05/2023: The ankle wound is healed underneath a layer of eschar. The lateral leg wound is smaller and has some slough accumulation. He was finally approved for a wound VAC and has it with him today. 01/13/2023: The wound  depth has come in considerably and the overall wound size has contracted as well. There is just a little slough on the surface. He is having an excellent response to negative pressure wound therapy. 01/19/2023: The wound is smaller and shallower again today. There is some slough accumulation on the surface. Patient History Family History Cancer - Father,Mother,Siblings, Diabetes - Mother,Siblings, Heart Disease, Lung Disease - Father, No family history of Hereditary Spherocytosis, Hypertension, Kidney Disease, Seizures, Stroke, Thyroid Problems, Tuberculosis. Social History [name], [name] ([id number]) [id number] Page 6 of 9 Former  smoker - ended on 12/23/1970, Marital Status - Married, Alcohol Use - Never, Drug Use - No History, Caffeine Use - Daily. Medical History Eyes Denies history of Cataracts, Glaucoma, Optic Neuritis Ear/Nose/Mouth/Throat Denies history of Chronic sinus problems/congestion, Middle ear problems Respiratory Denies history of Aspiration, Asthma, Chronic Obstructive Pulmonary Disease (COPD), Pneumothorax, Sleep Apnea, Tuberculosis Cardiovascular Patient has history of Congestive Heart Failure, Hypertension, Peripheral Venous Disease Endocrine Patient has history of Type II Diabetes Musculoskeletal Patient has history of Osteoarthritis Neurologic Patient has history of Neuropathy Oncologic Denies history of Received Chemotherapy, Received Radiation Psychiatric Denies history of Anorexia/bulimia, Confinement Anxiety Medical A Surgical History Notes nd Cardiovascular cardiomyopathy hyperlipidemia hypokalemia stroke Genitourinary orchitis Objective Constitutional no acute distress. Vitals Time Taken: 2:40 PM, Height: 74 in, Weight: 252 lbs, BMI: 32.4, Temperature: 97.9 F, Pulse: 85 bpm, Respiratory Rate: 16 breaths/min, Blood Pressure: 135/77 mmHg. General Notes: 01/19/2023: The wound is smaller and shallower again today. There is some  slough accumulation on the surface. Integumentary (Hair, Skin) Wound #1 status is Open. Original cause of wound was Shear/Friction. The date acquired was: 09/16/2022. The wound has been in treatment 10 weeks. The wound is located on the Right,Lateral Lower Leg. The wound measures 4.9cm length x 0.7cm width x 0.2cm depth; 2.694cm^2 area and 0.539cm^3 volume. There is Fat Layer (Subcutaneous Tissue) exposed. There is no tunneling or undermining noted. There is a medium amount of serosanguineous drainage noted. There is medium (34-66%) red, hyper - granulation within the wound bed. There is a medium (34-66%) amount of necrotic tissue within the wound bed including Adherent Slough. The periwound skin appearance had no abnormalities noted for moisture. The periwound skin appearance exhibited: Scarring, Hemosiderin Staining. Periwound temperature was noted as No Abnormality. Assessment Active Problems ICD-10 Non-pressure chronic ulcer of other part of right lower leg with unspecified severity Type 2 diabetes mellitus with other skin ulcer Chronic systolic (congestive) heart failure Peripheral vascular disease, unspecified Type 2 diabetes mellitus with diabetic polyneuropathy Cerebral infarction, unspecified Essential (primary) hypertension Chronic pain syndrome Corns and callosities Procedures Wound #1 Pre-procedure diagnosis of Wound #1 is a Trauma, Other located on the Right,Lateral Lower Leg . There was a Selective/Open Wound Non-Viable Tissue Debridement with a total area of 3.43 sq cm performed by Duanne Guess, MD. With the following instrument(s): Curette to remove Non-Viable tissue/material. Material removed includes [facility] after achieving pain control using Lidocaine 5% topical ointment. No specimens were taken. A time out was conducted at 15:05, prior to the start of the procedure. A Minimum amount of bleeding was controlled with Pressure. The procedure was tolerated well with a ASHAZ, ROBLING ([id number]) [id number] Page 7 of 9 pain level of 0 throughout and a pain level of 0 following the procedure. Post Debridement Measurements: 4.9cm length x 0.7cm width x 0.2cm depth; 0.539cm^3 volume. Character of Wound/Ulcer Post Debridement is improved. Post procedure Diagnosis Wound #1: Same as Pre-Procedure General Notes: Scribed for Dr. Lady Gary by J.Scotton. Pre-procedure diagnosis of Wound #1 is a Trauma, Other located on the Right,Lateral Lower Leg . There was a Three Layer Compression Therapy Procedure by Karie Schwalbe, RN. Post procedure Diagnosis Wound #1: Same as Pre-Procedure Plan Follow-up Appointments: Return Appointment in 1 week. - Dr. Lady Gary Room 3 Wound Vac change either Thursday or Friday Anesthetic: Wound #1 Right,Lateral Lower Leg: (In clinic) Topical Lidocaine 4% applied to wound bed Bathing/ Shower/ Hygiene: May shower with protection but do not get wound dressing(s) wet. Protect dressing(s) with water repellant cover (for example, large  plastic bag) or a cast cover and may then take shower. - May purchase cast protector at [facility], [facility], or [facility] Negative Presssure Wound Therapy: Wound #1 Right,Lateral Lower Leg: Wound Vac to wound continuously at 117mm/hg pressure - RUN IVR WOUND VAC for Right Lateral leg-done Black Foam Edema Control - Lymphedema / SCD / Other: Elevate legs to the level of the heart or above for 30 minutes daily and/or when sitting for 3-4 times a day throughout the day. Avoid standing for long periods of time. Off-Loading: Other: - Wear prescribed foot wear (Orthotics). Do not wear the boots. Home Health: Admit to Home Health for skilled nursing wound care. May utilize formulary equivalent dressing for wound treatment orders unless otherwise specified. - For wound vac change 1 x week (the other wound vac change will be performed at the [facility] 1 x week) Other Home Health Orders/Instructions: - [facility]. Please change wound vac 1 x week and the 2nd wound vac change will happen at the Wound Clinic WOUND #1: - Lower Leg Wound Laterality: Right, Lateral Cleanser: Soap and Water 1 x Per Week/30 Days Discharge Instructions: May shower and wash wound with dial antibacterial soap and water prior to dressing change. Cleanser: Wound Cleanser 1 x Per Week/30 Days Discharge Instructions: Cleanse the wound with wound cleanser prior to applying a clean dressing using gauze sponges, not tissue or cotton balls. Peri-Wound Care: Sween Lotion (Moisturizing lotion) 1 x Per Week/30 Days Discharge Instructions: Apply moisturizing lotion as directed Prim Dressing: Sorbalgon AG Dressing 2x2 (in/in) 1 x Per Week/30 Days ary Discharge Instructions: Apply to wound bed if not using the wound vac Prim Dressing: WOUND VAC 1 x Per Week/30 Days ary Discharge Instructions: apply to wound in RLLL Secondary Dressing: Zetuvit Plus 4x8 in 1 x Per Week/30 Days Discharge Instructions: Apply over primary dressing as directed. Secured With: Transpore Surgical T ape, 2x10 (in/yd) 1 x Per Week/30 Days Discharge Instructions: Secure dressing with tape as directed. Com pression Wrap: ThreePress (3 layer compression wrap) 1 x Per Week/30 Days Discharge Instructions: Apply three layer compression as directed. 01/19/2023: The wound is smaller and shallower again today. There is some slough accumulation on the surface. I used a curette to debride the slough from the wound. I think there is still enough depth to the wound that we can continue to use negative pressure wound therapy and so the wound VAC will be reapplied. He is certainly healing nicely with its use. Follow-up in 1 week. Electronic Signature(s) Signed: 01/19/2023 3:15:14 PM By: Duanne Guess MD FACS Entered By: Duanne Guess on 01/19/2023 15:15:13 -------------------------------------------------------------------------------- HxROS Details Patient Name:  Date of Service: [name], [name] Niemann [name]. 01/19/2023 2:15 PM Medical Record Number: [id number] Patient Account Number: [id number] Date of Birth/Sex: Treating RN: 05-Aug-1956 (67 y.o. M) Primary Care Provider: Babs Sciara Other Clinician: Referring Provider: Treating Provider/Extender: Myriam Jacobson in Treatment: [address] [name], [name] ([id number]) [id number] Page 8 of 9 Medical History: Negative for: Cataracts; Glaucoma; Optic Neuritis Ear/Nose/Mouth/Throat Medical History: Negative for: Chronic sinus problems/congestion; Middle ear problems Respiratory Medical History: Negative for: Aspiration; Asthma; Chronic Obstructive Pulmonary Disease (COPD); Pneumothorax; Sleep Apnea; Tuberculosis Cardiovascular Medical History: Positive for: Congestive Heart Failure; Hypertension; Peripheral Venous Disease Past Medical History Notes: cardiomyopathy hyperlipidemia hypokalemia stroke Endocrine Medical History: Positive for: Type II Diabetes Time with diabetes: 20 years Treated with: Insulin, Oral agents Genitourinary Medical History: Past Medical History Notes: orchitis Musculoskeletal Medical History: Positive for: Osteoarthritis Neurologic  Medical History: Positive for: Neuropathy Oncologic Medical History: Negative for: Received Chemotherapy; Received Radiation Psychiatric Medical History: Negative for: Anorexia/bulimia; Confinement Anxiety Immunizations Pneumococcal Vaccine: Received Pneumococcal Vaccination: Yes Received Pneumococcal Vaccination On or After 60th Birthday: Yes Implantable Devices No devices added Family and Social History Cancer: Yes - Father,Mother,Siblings; Diabetes: Yes - Mother,Siblings; Heart Disease: Yes; Hereditary Spherocytosis: No; Hypertension: No; Kidney Disease: No; Lung Disease: Yes - Father; Seizures: No; Stroke: No; Thyroid Problems: No; Tuberculosis: No; Former smoker - ended on 12/23/1970;  Marital Status - Married; Alcohol Use: Never; Drug Use: No History; Caffeine Use: Daily; Financial Concerns: No; Food, Clothing or Shelter Needs: No; Support System Lacking: No; Transportation Concerns: No Electronic Signature(s) Signed: 01/19/2023 3:30:44 PM By: Duanne Guessannon, Roosevelt Bisher MD FACS Entered By: Duanne Guessannon, Bellarose Burtt on 01/19/2023 15:13:54 [name], [name] ([id number]) [id number] Page 9 of 9 -------------------------------------------------------------------------------- SuperBill Details Patient Name: [name] [name] ColderWILLIA M [name]. 01/19/2023 Medical Record Number: [id number] Patient Account Number: [id number] Date of Birth/Sex: Treating RN: 07-Jun-1956 (67 y.o. M) Primary Care Provider: Babs SciaraLuking, Scott A Other Clinician: Referring Provider: Treating Provider/Extender: Myriam Jacobsonannon, Paquita Printy Luking, Scott A Weeks in Treatment: 10 Diagnosis Coding ICD-10 Codes Code Description [id number] Non-pressure chronic ulcer of other part of right lower leg with unspecified severity E11.622 Type 2 diabetes mellitus with other skin ulcer I50.22 Chronic systolic (congestive) heart failure I73.9 Peripheral vascular disease, unspecified E11.42 Type 2 diabetes mellitus with diabetic polyneuropathy I63.9 Cerebral infarction, unspecified I10 Essential (primary) hypertension G89.4 Chronic pain syndrome L84 Corns and callosities Facility Procedures : CPT4 Code: [id number] Description: 97597 - DEBRIDE WOUND 1ST 20 SQ CM OR < ICD-10 Diagnosis Description L97.819 Non-pressure chronic ulcer of other part of right lower leg with unspecified seve Modifier: rity Quantity: 1 Physician Procedures : CPT4 Code Description Modifier [id number] 99214 - WC PHYS LEVEL 4 - EST PT 25 ICD-10 Diagnosis Description L97.819 Non-pressure chronic ulcer of other part of right lower leg with unspecified severity E11.622 Type 2 diabetes mellitus with other skin ulcer  I50.22 Chronic systolic (congestive) heart  failure I73.9 Peripheral vascular disease, unspecified Quantity: 1 : [id number] 97597 - WC PHYS DEBR WO ANESTH 20 SQ CM ICD-10 Diagnosis Description L97.819 Non-pressure chronic ulcer of other part of right lower leg with unspecified severity Quantity: 1 Electronic Signature(s) Signed: 01/19/2023 3:15:34 PM By: Duanne Guessannon, Lulabelle Desta MD FACS Entered By: Duanne Guessannon, Kang Ishida on 01/19/2023 15:15:33

## 2023-01-20 NOTE — Progress Notes (Signed)
[name], [name] ([id number]) [id number] Page 1 of 7 Visit Report for 01/19/2023 Arrival Information Details Patient Name: Date of Service: [name], [name]. 01/19/2023 2:15 PM Medical Record Number: [id number] Patient Account Number: [id number] Date of Birth/Sex: Treating RN: August 27, 1956 (67 y.o. [name]) Scotton, Joanne Primary Care Talal Fritchman: Babs SciaraLuking, Scott A Other Clinician: Referring Anil Havard: Treating Kabir Brannock/Extender: Myriam Jacobsonannon, Jennifer Luking, Scott A Weeks in Treatment: 10 Visit Information History Since Last Visit Added or deleted any medications: No Patient Arrived: Ambulatory Any new allergies or adverse reactions: No Arrival Time: 14:38 Had a fall or experienced change in No Accompanied By: spouse activities of daily living that may affect Transfer Assistance: None risk of falls: Patient Identification Verified: Yes Signs or symptoms of abuse/neglect since last visito No Patient Requires Transmission-Based Precautions: No Hospitalized since last visit: No Patient Has Alerts: Yes Implantable device outside of the clinic excluding No Patient Alerts: ABI RLE [facility] cellular tissue based products placed in the center since last visit: Has Dressing in Place as Prescribed: Yes Has Compression in Place as Prescribed: Yes Pain Present Now: Yes Electronic Signature(s) Signed: 01/19/2023 5:30:29 PM By: Karie SchwalbeScotton, Joanne RN Entered By: Karie SchwalbeScotton, Joanne on 01/19/2023 14:39:19 -------------------------------------------------------------------------------- Compression Therapy Details Patient Name: Date of Service: [place] [name], [name]. 01/19/2023 2:15 PM Medical Record Number: [id number] Patient Account Number: [id number] Date of Birth/Sex: Treating RN: August 27, 1956 (67 y.o. [name]) Scotton, Joanne Primary Care Sholonda Jobst: Babs SciaraLuking, Scott A Other Clinician: Referring Kevin Mario: Treating Elizabelle Fite/Extender: Elmer Rampannon, Jennifer Luking, Scott A Weeks in Treatment: 10 Compression Therapy  Performed for Wound Assessment: Wound #1 Right,Lateral Lower Leg Performed By: Clinician Karie SchwalbeScotton, Joanne, RN Compression Type: Three Layer Post Procedure Diagnosis Same as Pre-procedure Electronic Signature(s) Signed: 01/19/2023 5:30:29 PM By: Karie SchwalbeScotton, Joanne RN Entered By: Karie SchwalbeScotton, Joanne on 01/19/2023 15:10:37 -------------------------------------------------------------------------------- Encounter Discharge Information Details Patient Name: Date of Service: [name], [name]. 01/19/2023 2:15 PM Medical Record Number: [id number] Patient Account Number: [id number] Date of Birth/Sex: Treating RN: August 27, 1956 (67 y.o. [name]) Scotton, Joanne Primary Care Onyinyechi Huante: Babs SciaraLuking, Scott A Other Clinician: Referring Izayah Miner: Treating Florita Nitsch/Extender: Myriam Jacobsonannon, Jennifer Luking, Scott A Weeks in Treatment: 10 Encounter Discharge Information Items Post Procedure Vitals Discharge Condition: Stable Temperature (F): 97.9 Ambulatory Status: Ambulatory Pulse (bpm): 85 Discharge Destination: Home Respiratory Rate (breaths/min): 16 Transportation: Private Auto Blood Pressure (mmHg): 135/77 [name], [name] ([id number]) [id number] Page 2 of 7 Accompanied By: spouse Schedule Follow-up Appointment: Yes Clinical Summary of Care: Patient Declined Electronic Signature(s) Signed: 01/19/2023 5:30:29 PM By: Karie SchwalbeScotton, Joanne RN Entered By: Karie SchwalbeScotton, Joanne on 01/19/2023 17:04:20 -------------------------------------------------------------------------------- Lower Extremity Assessment Details Patient Name: Date of Service: [name] [place] [name], [name]. 01/19/2023 2:15 PM Medical Record Number: [id number] Patient Account Number: [id number] Date of Birth/Sex: Treating RN: August 27, 1956 (67 y.o. [name]) Scotton, Joanne Primary Care Adisson Deak: Babs SciaraLuking, Scott A Other Clinician: Referring Danice Dippolito: Treating Shloima Clinch/Extender: Marchelle Folksannon, Jennifer Luking, Scott A Weeks in Treatment: 10 Edema Assessment Assessed: [Left: No]  [Right: No] [Left: Edema] [Right: :] Calf Left: Right: Point of Measurement: From Medial Instep 37.8 cm Ankle Left: Right: Point of Measurement: From Medial Instep 26.2 cm Vascular Assessment Pulses: Dorsalis Pedis Palpable: [Right:Yes] Electronic Signature(s) Signed: 01/19/2023 5:30:29 PM By: Karie SchwalbeScotton, Joanne RN Entered By: Karie SchwalbeScotton, Joanne on 01/19/2023 14:46:23 -------------------------------------------------------------------------------- Multi Wound Chart Details Patient Name: Date of Service: [name], [name]. 01/19/2023 2:15 PM Medical Record Number: [id number] Patient Account Number: [id number] Date of Birth/Sex: Treating RN: August 27, 1956 (67 y.o. M) Primary Care Atanacio Melnyk: Babs SciaraLuking, Scott A Other Clinician: Referring Tywon Niday: Treating Jonette Wassel/Extender: Lady Garyannon,  Jarold SongJennifer Luking, Scott A Weeks in Treatment: 10 Vital Signs Height(in): 74 Pulse(bpm): 85 Weight(lbs): 252 Blood Pressure(mmHg): 135/77 Body Mass Index(BMI): 32.4 Temperature(F): 97.9 Respiratory Rate(breaths/min): 16 [1:Photos:] [N/A:N/A] Right, Lateral Lower Leg N/A N/A Wound Location: Shear/Friction N/A N/A Wounding Event: Trauma, Other N/A N/A Primary Etiology: Congestive Heart Failure, N/A N/A Comorbid History: Hypertension, Peripheral Venous Disease, Type II Diabetes, Osteoarthritis, Neuropathy 09/16/2022 N/A N/A Date Acquired: 10 N/A N/A Weeks of Treatment: Open N/A N/A Wound Status: No N/A N/A Wound Recurrence: 4.9x0.7x0.2 N/A N/A Measurements L x W x [name] (cm) 2.694 N/A N/A A (cm) : rea 0.539 N/A N/A Volume (cm) : 36.50% N/A N/A % Reduction in A rea: 57.60% N/A N/A % Reduction in Volume: Full Thickness Without Exposed N/A N/A Classification: Support Structures Medium N/A N/A Exudate A mount: Serosanguineous N/A N/A Exudate Type: red, brown N/A N/A Exudate Color: Medium (34-66%) N/A N/A Granulation A mount: Red, Hyper-granulation N/A N/A Granulation Quality: Medium (34-66%)  N/A N/A Necrotic A mount: Fat Layer (Subcutaneous Tissue): Yes N/A N/A Exposed Structures: Fascia: No Tendon: No Muscle: No Joint: No Bone: No Small (1-33%) N/A N/A Epithelialization: Debridement - Selective/Open Wound N/A N/A Debridement: Pre-procedure Verification/Time Out 15:05 N/A N/A Taken: Lidocaine 5% topical ointment N/A N/A Pain Control: Slough N/A N/A Tissue Debrided: Non-Viable Tissue N/A N/A Level: 3.43 N/A N/A Debridement A (sq cm): rea Curette N/A N/A Instrument: Minimum N/A N/A Bleeding: Pressure N/A N/A Hemostasis A chieved: 0 N/A N/A Procedural Pain: 0 N/A N/A Post Procedural Pain: Procedure was tolerated well N/A N/A Debridement Treatment Response: 4.9x0.7x0.2 N/A N/A Post Debridement Measurements L x W x [name] (cm) 0.539 N/A N/A Post Debridement Volume: (cm) Scarring: Yes N/A N/A Periwound Skin Texture: No Abnormalities Noted N/A N/A Periwound Skin Moisture: Hemosiderin Staining: Yes N/A N/A Periwound Skin Color: No Abnormality N/A N/A Temperature: Compression Therapy N/A N/A Procedures Performed: Debridement Treatment Notes Electronic Signature(s) Signed: 01/19/2023 3:13:15 PM By: Duanne Guessannon, Jennifer MD FACS Entered By: Duanne Guessannon, Jennifer on 01/19/2023 15:13:15 -------------------------------------------------------------------------------- Multi-Disciplinary Care Plan Details Patient Name: [place] of [place] [name][place] [name]. 01/19/2023 2:15 PM Medical Record Number: [id number] Patient Account Number: [id number] Date of Birth/Sex: Treating RN: [birth year] (67 y.o. [name]) Scotton, Joanne Primary Care Labrittany Wechter: Babs SciaraLuking, Scott A Other Clinician: Referring Nelida Mandarino: Treating Chantilly Linskey/Extender: Myriam Jacobsonannon, Jennifer Luking, Scott A Weeks in Treatment: 10 Multidisciplinary Care Plan reviewed with physician [address] Inactive [name], Omarius [name] ([id number]) [id number] Page 4 of 7 Venous Leg Ulcer Nursing Diagnoses: Knowledge deficit  related to disease process and management Potential for venous Insuffiency (use before diagnosis confirmed) Goals: Patient will maintain optimal edema control Date Initiated: 11/23/2022 Target Resolution Date: 05/16/2023 Goal Status: Active Interventions: Assess peripheral edema status every visit. Compression as ordered Treatment Activities: Therapeutic compression applied : 11/23/2022 Notes: Wound/Skin Impairment Nursing Diagnoses: Impaired tissue integrity Goals: Patient/caregiver will verbalize understanding of skin care regimen Date Initiated: 11/23/2022 Target Resolution Date: 05/16/2023 Goal Status: Active Ulcer/skin breakdown will have a volume reduction of 30% by week 4 Date Initiated: 11/07/2022 Target Resolution Date: 05/16/2023 Goal Status: Active Interventions: Assess patient/caregiver ability to obtain necessary supplies Assess patient/caregiver ability to perform ulcer/skin care regimen upon admission and as needed Assess ulceration(s) every visit Treatment Activities: Skin care regimen initiated : 11/07/2022 Topical wound management initiated : 11/07/2022 Notes: Electronic Signature(s) Signed: 01/19/2023 5:30:29 PM By: Karie SchwalbeScotton, Joanne RN Entered By: Karie SchwalbeScotton, Joanne on 01/19/2023 17:01:29 -------------------------------------------------------------------------------- Negative Pressure Wound Therapy Maintenance (NPWT) Details Patient Name: Date of Service: [place] [name], [name]. 01/19/2023 2:15  PM Medical Record Number: [id number] Patient Account Number: [id number] Date of Birth/Sex: Treating RN: 1956-04-18 (67 y.o. [name] Primary Care Chandell Attridge: Babs Sciara Other Clinician: Referring Maxima Skelton: Treating Camey Edell/Extender: Marchelle Folks, Scott A Weeks in Treatment: 10 NPWT Maintenance Performed for: Wound #1 Right, Lateral Lower Leg Performed By: Karie Schwalbe, RN Coverage Size (sq cm): 3.43 Pressure Type: Constant Pressure Setting: 125  mmHG Drain Type: None Primary Contact: Non-Adherent Sponge/Dressing Type: Foam- Black Date Initiated: 01/05/2023 Quantity of Sponges/Gauze Removed: 1 Canister Exudate Volume: 10 Quantity of Sponges/Gauze Inserted: 1 black Respones T Treatment: o tolerated well Days On NPWT : 15 Post Procedure Diagnosis Same as Pre-procedure [name] [place] ([id number]) [id number] Page 5 of 7 Electronic Signature(s) Signed: 01/19/2023 5:30:29 PM By: Karie Schwalbe RN Entered By: Karie Schwalbe on 01/19/2023 17:03:08 -------------------------------------------------------------------------------- Pain Assessment Details Patient Name: Date of Service: [name] [place] 01/19/2023 2:15 PM Medical Record Number: [id number] Patient Account Number: [id number] Date of Birth/Sex: Treating RN: Dec 07, 1955 (67 y.o. [name] Primary Care Lajada Janes: Babs Sciara Other Clinician: Referring Kano Heckmann: Treating Ninfa Giannelli/Extender: Myriam Jacobson in Treatment: 10 Active Problems Location of Pain Severity and Description of Pain Patient Has Paino No Site Locations Pain Management and Medication Current Pain Management: Electronic Signature(s) Signed: 01/19/2023 5:30:29 PM By: Karie Schwalbe RN Entered By: Karie Schwalbe on 01/19/2023 14:40:50 -------------------------------------------------------------------------------- Patient/Caregiver Education Details Patient Name: Date of Service: [name], [name] 4/4/2024andnbsp2:15 PM Medical Record Number: [id number] Patient Account Number: [id number] Date of Birth/Gender: Treating RN: Jan 02, 1956 (67 y.o. [name] Primary Care Physician: Babs Sciara Other Clinician: Referring Physician: Treating Physician/Extender: Myriam Jacobson in Treatment: 10 Education Assessment Education Provided To: Patient Education Topics Provided Wound/Skin Impairment: Methods:  Explain/Verbal Responses: Return demonstration correctly SUNDAY, [place] ([id number]) [id number].pdf Page 6 of 7 Electronic Signature(s) Signed: 01/19/2023 5:30:29 PM By: Karie Schwalbe RN Entered By: Karie Schwalbe on 01/19/2023 17:01:42 -------------------------------------------------------------------------------- Wound Assessment Details Patient Name: Date of Service: [name] [place] 01/19/2023 2:15 PM Medical Record Number: [id number] Patient Account Number: [id number] Date of Birth/Sex: Treating RN: 1956/04/19 (67 y.o. [name] Primary Care Romir Klimowicz: Babs Sciara Other Clinician: Referring Xylon Croom: Treating Leonarda Leis/Extender: Marchelle Folks, Scott A Weeks in Treatment: 10 Wound Status Wound Number: 1 Primary Trauma, Other Etiology: Wound Location: Right, Lateral Lower Leg Wound Open Wounding Event: Shear/Friction Status: Date Acquired: 09/16/2022 Comorbid Congestive Heart Failure, Hypertension, Peripheral Venous Weeks Of Treatment: 10 History: Disease, Type II Diabetes, Osteoarthritis, Neuropathy Clustered Wound: No Photos Wound Measurements Length: (cm) 4.9 Width: (cm) 0.7 Depth: (cm) 0.2 Area: (cm) 2.694 Volume: (cm) 0.539 % Reduction in Area: 36.5% % Reduction in Volume: 57.6% Epithelialization: Small (1-33%) Tunneling: No Undermining: No Wound Description Classification: Full Thickness Without Exposed Support Structures Exudate Amount: Medium Exudate Type: Serosanguineous Exudate Color: red, brown Foul Odor After Cleansing: No Slough/Fibrino Yes Wound Bed Granulation Amount: Medium (34-66%) Exposed Structure Granulation Quality: Red, Hyper-granulation Fascia Exposed: No Necrotic Amount: Medium (34-66%) Fat Layer (Subcutaneous Tissue) Exposed: Yes Necrotic Quality: Adherent Slough Tendon Exposed: No Muscle Exposed: No Joint Exposed: No Bone Exposed: No Periwound Skin Texture Texture Color No  Abnormalities Noted: No No Abnormalities Noted: No Scarring: Yes Hemosiderin Staining: Yes Moisture Temperature / Pain No Abnormalities Noted: Yes Temperature: No Abnormality Treatment Notes Wound #1 (Lower Leg) Wound Laterality: Right, Lateral Cleanser EDYSON, GILLS ([id number]) [id number] Page 7 of 7 Soap and Water Discharge Instruction: May shower and wash  wound with dial antibacterial soap and water prior to dressing change. Wound Cleanser Discharge Instruction: Cleanse the wound with wound cleanser prior to applying a clean dressing using gauze sponges, not tissue or cotton balls. Peri-Wound Care Sween Lotion (Moisturizing lotion) Discharge Instruction: Apply moisturizing lotion as directed Topical Primary Dressing Sorbalgon AG Dressing 2x2 (in/in) Discharge Instruction: Apply to wound bed if not using the wound vac WOUND VAC Discharge Instruction: apply to wound in RLLL Secondary Dressing Zetuvit Plus 4x8 in Discharge Instruction: Apply over primary dressing as directed. Secured With Transpore Surgical Tape, 2x10 (in/yd) Discharge Instruction: Secure dressing with tape as directed. Compression Wrap ThreePress (3 layer compression wrap) Discharge Instruction: Apply three layer compression as directed. Compression Stockings Add-Ons Electronic Signature(s) Signed: 01/19/2023 5:30:29 PM By: Karie Schwalbe RN Entered By: Karie Schwalbe on 01/19/2023 14:49:41 -------------------------------------------------------------------------------- Vitals Details Patient Name: Date of Service: [name], [name] Niemann [name]. 01/19/2023 2:15 PM Medical Record Number: [id number] Patient Account Number: [id number] Date of Birth/Sex: Treating RN: 27-Aug-1956 (67 y.o. [name] Primary Care Saryah Loper: Babs Sciara Other Clinician: Referring Nashiya Disbrow: Treating Ferry Matthis/Extender: Elmer Ramp Weeks in Treatment: 10 Vital Signs Time Taken:  14:40 Temperature (F): 97.9 Height (in): 74 Pulse (bpm): 85 Weight (lbs): 252 Respiratory Rate (breaths/min): 16 Body Mass Index (BMI): 32.4 Blood Pressure (mmHg): 135/77 Reference Range: 80 - 120 mg / dl Electronic Signature(s) Signed: 01/19/2023 5:30:29 PM By: Karie Schwalbe RN Entered By: Karie Schwalbe on 01/19/2023 14:40:43

## 2023-01-27 ENCOUNTER — Encounter (HOSPITAL_BASED_OUTPATIENT_CLINIC_OR_DEPARTMENT_OTHER): Payer: Medicare HMO | Admitting: General Surgery

## 2023-01-27 DIAGNOSIS — I2510 Atherosclerotic heart disease of native coronary artery without angina pectoris: Secondary | ICD-10-CM | POA: Diagnosis not present

## 2023-01-27 DIAGNOSIS — E1151 Type 2 diabetes mellitus with diabetic peripheral angiopathy without gangrene: Secondary | ICD-10-CM | POA: Diagnosis not present

## 2023-01-27 DIAGNOSIS — E11621 Type 2 diabetes mellitus with foot ulcer: Secondary | ICD-10-CM | POA: Diagnosis not present

## 2023-01-27 DIAGNOSIS — L97919 Non-pressure chronic ulcer of unspecified part of right lower leg with unspecified severity: Secondary | ICD-10-CM | POA: Diagnosis not present

## 2023-01-27 DIAGNOSIS — L97812 Non-pressure chronic ulcer of other part of right lower leg with fat layer exposed: Secondary | ICD-10-CM | POA: Diagnosis not present

## 2023-01-27 DIAGNOSIS — G894 Chronic pain syndrome: Secondary | ICD-10-CM | POA: Diagnosis not present

## 2023-01-27 DIAGNOSIS — L84 Corns and callosities: Secondary | ICD-10-CM | POA: Diagnosis not present

## 2023-01-27 DIAGNOSIS — L97512 Non-pressure chronic ulcer of other part of right foot with fat layer exposed: Secondary | ICD-10-CM | POA: Diagnosis not present

## 2023-01-27 DIAGNOSIS — E11622 Type 2 diabetes mellitus with other skin ulcer: Secondary | ICD-10-CM | POA: Diagnosis not present

## 2023-01-27 DIAGNOSIS — Z833 Family history of diabetes mellitus: Secondary | ICD-10-CM | POA: Diagnosis not present

## 2023-01-27 DIAGNOSIS — I5022 Chronic systolic (congestive) heart failure: Secondary | ICD-10-CM | POA: Diagnosis not present

## 2023-01-27 DIAGNOSIS — I429 Cardiomyopathy, unspecified: Secondary | ICD-10-CM | POA: Diagnosis not present

## 2023-01-27 DIAGNOSIS — L97312 Non-pressure chronic ulcer of right ankle with fat layer exposed: Secondary | ICD-10-CM | POA: Diagnosis not present

## 2023-01-27 DIAGNOSIS — I110 Hypertensive heart disease with heart failure: Secondary | ICD-10-CM | POA: Diagnosis not present

## 2023-01-27 DIAGNOSIS — E1142 Type 2 diabetes mellitus with diabetic polyneuropathy: Secondary | ICD-10-CM | POA: Diagnosis not present

## 2023-01-27 NOTE — Progress Notes (Signed)
RONTAVIOUS, STAS ([id number]) [id number] Page 1 of 11 Visit Report for 01/27/2023 Chief Complaint Document Details Patient Name: Date of Service: [name] 01/27/2023 1:15 PM Medical Record Number: [id number] Patient Account Number: [id number] Date of Birth/Sex: Treating RN: [birth year] (67 y.o. M) Primary Care Provider: Babs Sciara Other Clinician: Referring Provider: Treating Provider/Extender: Myriam Jacobson in Treatment: 11 Information Obtained from: Patient Chief Complaint Patients presents for treatment of an open diabetic ulcer on his right foot, as well as ulcers on his right lower leg and ankle Electronic Signature(s) Signed: 01/27/2023 2:01:11 PM By: Duanne Guess MD FACS Previous Signature: 01/27/2023 1:52:22 PM Version By: Duanne Guess MD FACS Entered By: Duanne Guess on 01/27/2023 14:01:11 -------------------------------------------------------------------------------- Debridement Details Patient Name: Date of Service: [name], [name] D. 01/27/2023 1:15 PM Medical Record Number: [id number] Patient Account Number: [id number] Date of Birth/Sex: Treating RN: 1955/12/24 (67 y.o. Dianna Limbo Primary Care Provider: Babs Sciara Other Clinician: Referring Provider: Treating Provider/Extender: Myriam Jacobson in Treatment: 11 Debridement Performed for Assessment: Wound #1 Right,Lateral Lower Leg Performed By: Physician Duanne Guess, MD Debridement Type: Debridement Level of Consciousness (Pre-procedure): Awake and Alert Pre-procedure Verification/Time Out Yes - 13:38 Taken: Start Time: 13:38 Pain Control: Lidocaine 5% topical ointment T Area Debrided (L x W): otal 3.9 (cm) x 0.7 (cm) = 2.73 (cm) Tissue and other material debrided: Non-Viable, Slough, Subcutaneous, Slough Level: Skin/Subcutaneous Tissue Debridement Description: Excisional Instrument:  Curette Bleeding: Minimum Hemostasis Achieved: Pressure End Time: 13:38 Procedural Pain: 0 Post Procedural Pain: 0 Response to Treatment: Procedure was tolerated well Level of Consciousness (Post- Awake and Alert procedure): Post Debridement Measurements of Total Wound Length: (cm) 3.9 Width: (cm) 0.7 Depth: (cm) 0.2 Volume: (cm) 0.429 Character of Wound/Ulcer Post Debridement: Improved Post Procedure Diagnosis Same as Pre-procedure Notes Scribed for Dr. Lady Gary by J.Scotton Electronic Signature(s) Signed: 01/27/2023 2:25:12 PM By: Duanne Guess MD FACS Signed: 01/27/2023 3:43:45 PM By: Karie Schwalbe RN Esaw Grandchild 01/27/2023 3:43:45 PM By: Karie Schwalbe RN Signed: D ([id number]) [id number] Page 2 of 11 Entered By: Karie Schwalbe on 01/27/2023 13:42:53 -------------------------------------------------------------------------------- Debridement Details Patient Name: Date of Service: [name] 01/27/2023 1:15 PM Medical Record Number: [id number] Patient Account Number: [id number] Date of Birth/Sex: Treating RN: 1956-04-01 (67 y.o. Dianna Limbo Primary Care Provider: Babs Sciara Other Clinician: Referring Provider: Treating Provider/Extender: Myriam Jacobson in Treatment: 11 Debridement Performed for Assessment: Wound #4 Right Metatarsal head second Performed By: Physician Duanne Guess, MD Debridement Type: Debridement Severity of Tissue Pre Debridement: Fat layer exposed Level of Consciousness (Pre-procedure): Awake and Alert Pre-procedure Verification/Time Out Yes - 13:38 Taken: Start Time: 13:38 Pain Control: Lidocaine 5% topical ointment T Area Debrided (L x W): otal 0.3 (cm) x 0.3 (cm) = 0.09 (cm) Tissue and other material debrided: Non-Viable, Callus, Slough, Slough Level: Non-Viable Tissue Debridement Description: Selective/Open Wound Instrument: Curette Bleeding: Minimum Hemostasis  Achieved: Pressure End Time: 13:38 Procedural Pain: 0 Post Procedural Pain: 0 Response to Treatment: Procedure was tolerated well Level of Consciousness (Post- Awake and Alert procedure): Post Debridement Measurements of Total Wound Length: (cm) 0.3 Width: (cm) 0.3 Depth: (cm) 0.2 Volume: (cm) 0.014 Character of Wound/Ulcer Post Debridement: Improved Severity of Tissue Post Debridement: Fat layer exposed Post Procedure Diagnosis Same as Pre-procedure Notes Scribed for Dr. Lady Gary by J.Scotton Electronic Signature(s) Signed: 01/27/2023 2:25:12 PM By: Duanne Guess MD FACS Signed: 01/27/2023 3:43:45 PM By: Karie Schwalbe  RN Entered By: Karie Schwalbe on 01/27/2023 13:54:15 -------------------------------------------------------------------------------- HPI Details Patient Name: Date of Service: [place] RE, [name] 01/27/2023 1:15 PM Medical Record Number: [id number] Patient Account Number: [id number] Date of Birth/Sex: Treating RN: [birth year] (67 y.o. 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 He has a remote history of surgery on his right ankle and apparently as his legs have become increasingly more swollen and he has insisted upon wearing boots, the KALIX, MEINECKE ([id number]) [phone].pdf Page 3 of 11 pressure and friction seem to have opened up wounds along the old scar line. In addition, he has a large callus on the third metatarsal head, related to his acquired  foot deformity. He sees a podiatrist who periodically shaves this callus down and he was also recommended orthotics, but the patient has refused them because he likes his boots. There is an area of discoloration in the lower portion of this callus, suspicious for an open wound. 11/14/2022: The ulcer on his third metatarsal head has closed. The lateral leg ulcers are both about the same size, but he says that they feel better. There is slough accumulation in both sites. 11/23/2022: The ulcer on his foot remains closed. The lateral leg ulcers are still unchanged in size, but are a bit cleaner, just with some slough accumulation. 12/01/2022: No significant change to the size of the wounds on his lateral leg. The larger is quite clean with good granulation tissue present. The smaller still has fairly thick slough buildup. 12/09/2022: Both wounds are smaller today. They still have slough accumulation, but less so than at his previous visit. The callus on his foot has built up again and is uncomfortable. 12/16/2022: The wound at his ankle is nearly closed with just a little slough on the remaining open surface. The lateral leg wound is about the same size but has less slough accumulation. We are still working on Therapist, occupational for wound VAC. 12/23/2022: The wound is ankle is down to just a pinhole with a small amount of slough and eschar present. The lateral leg wound has contracted in length and is also shallower. There is slough on the surface. We still have not secured insurance approval for his wound VAC. 01/05/2023: The ankle wound is healed underneath a layer of eschar. The lateral leg wound is smaller and has some slough accumulation. He was finally approved for a wound VAC and has it with him today. 01/13/2023: The wound depth has come in considerably and the overall wound size has contracted as well. There is just a little slough on the surface. He is having an excellent response to negative pressure  wound therapy. 01/19/2023: The wound is smaller and shallower again today. There is some slough accumulation on the surface. 01/27/2023: The wound is smaller again today and is essentially flush with the surrounding skin. The wound on his second metatarsal head has reopened under a thick layer of callus. Electronic Signature(s) Signed: 01/27/2023 2:01:42 PM By: Duanne Guess MD FACS Entered By: Duanne Guess on 01/27/2023 14:01:42 -------------------------------------------------------------------------------- Physical Exam Details Patient Name: Date of Service: [name], [name] D. 01/27/2023  1:15 PM Medical Record Number: [id number] Patient Account Number: [id number] Date of Birth/Sex: Treating RN: [birth year] (67 y.o. M) Primary Care Provider: Babs Sciara Other Clinician: Referring Provider: Treating Provider/Extender: Marchelle Folks, Scott A Weeks in Treatment: 11 Constitutional . . . . no acute distress. Respiratory Normal work of breathing on room air. Notes 01/27/2023: The wound is smaller again today and is essentially flush with the surrounding skin. The wound on his second metatarsal head has reopened under a thick layer of callus. Electronic Signature(s) Signed: 01/27/2023 2:02:35 PM By: Duanne Guess MD FACS Entered By: Duanne Guess on 01/27/2023 14:02:35 -------------------------------------------------------------------------------- Physician Orders Details Patient Name: Date of Service: [name], [name] D. 01/27/2023 1:15 PM Medical Record Number: [id number] Patient Account Number: [id number] Date of Birth/Sex: Treating RN: 1956/03/10 (67 y.o. Dianna Limbo Primary Care Provider: Babs Sciara Other Clinician: Referring Provider: Treating Provider/Extender: Myriam Jacobson in Treatment: 49 Verbal / Phone Orders: No Diagnosis Coding ICD-10 [address] KARDER, GOODIN ([id number]) [id number] Page 4  of 11 Code Description L97.819 Non-pressure chronic ulcer of other part of right lower leg with unspecified severity L97.512 Non-pressure chronic ulcer of other part of right foot with fat layer exposed E11.622 Type 2 diabetes mellitus with other skin ulcer E11.621 Type 2 diabetes mellitus with foot ulcer I50.22 Chronic systolic (congestive) heart failure I73.9 Peripheral vascular disease, unspecified E11.42 Type 2 diabetes mellitus with diabetic polyneuropathy I63.9 Cerebral infarction, unspecified I10 Essential (primary) hypertension G89.4 Chronic pain syndrome L84 Corns and callosities Follow-up Appointments ppointment in 1 week. - Dr. Lady Gary Room 3 Return A Anesthetic Wound #1 Right,Lateral Lower Leg (In clinic) Topical Lidocaine 4% applied to wound bed Bathing/ Shower/ Hygiene May shower with protection but do not get wound dressing(s) wet. Protect dressing(s) with water repellant cover (for example, large plastic bag) or a cast cover and may then take shower. - May purchase cast protector at [facility], [facility], or [facility] Negative Presssure Wound Therapy Discontinue wound vac. Call the number on the machine for the company to pick up. Edema Control - Lymphedema / SCD / Other Right Lower Extremity Elevate legs to the level of the heart or above for 30 minutes daily and/or when sitting for 3-4 times a day throughout the day. Avoid standing for long periods of time. Off-Loading Other: - Wear prescribed foot wear (Orthotics). Do not wear the boots. Home Health Discontinue home health for wound care. - [facility] -discontinue Wound Treatment Wound #1 - Lower Leg Wound Laterality: Right, Lateral Cleanser: Soap and Water 1 x Per Week/30 Days Discharge Instructions: May shower and wash wound with dial antibacterial soap and water prior to dressing change. Cleanser: Wound Cleanser 1 x Per Week/30 Days Discharge Instructions: Cleanse the wound with wound cleanser prior to applying a clean  dressing using gauze sponges, not tissue or cotton balls. Peri-Wound Care: Zinc Oxide Ointment 30g tube 1 x Per Week/30 Days Discharge Instructions: Apply Zinc Oxide to periwound with each dressing change Peri-Wound Care: Sween Lotion (Moisturizing lotion) 1 x Per Week/30 Days Discharge Instructions: Apply moisturizing lotion as directed Prim Dressing: Promogran Prisma Matrix, 4.34 (sq in) (silver collagen) 1 x Per Week/30 Days ary Discharge Instructions: Moisten collagen with saline or hydrogel Secondary Dressing: Zetuvit Plus 4x8 in 1 x Per Week/30 Days Discharge Instructions: Apply over primary dressing as directed. Secured With: Transpore Surgical Tape, 2x10 (in/yd) 1 x Per Week/30 Days Discharge Instructions: Secure dressing with tape as directed. Compression Wrap: ThreePress (3 layer compression wrap)  1 x Per Week/30 Days Discharge Instructions: Apply three layer compression as directed. Wound #4 - Metatarsal head second Wound Laterality: Right Cleanser: Soap and Water Discharge Instructions: May shower and wash wound with dial antibacterial soap and water prior to dressing change. Cleanser: Wound Cleanser Discharge Instructions: Cleanse the wound with wound cleanser prior to applying a clean dressing using gauze sponges, not tissue or cotton balls. Prim Dressing: Maxorb Extra Ag+ Alginate Dressing, 2x2 (in/in) ary Discharge Instructions: Apply to wound bed as instructed TAVIUS, TURGEON ([id number]) [id number] Page 5 of 11 Secondary Dressing: Optifoam Non-Adhesive Dressing, 4x4 in Discharge Instructions: Apply over primary dressing as directed. Electronic Signature(s) Signed: 01/27/2023 2:25:12 PM By: Duanne Guess MD FACS Signed: 01/27/2023 3:43:45 PM By: Karie Schwalbe RN Previous Signature: 01/27/2023 2:02:50 PM Version By: Duanne Guess MD FACS Entered By: Karie Schwalbe on 01/27/2023  14:06:39 -------------------------------------------------------------------------------- Problem List Details Patient Name: Date of Service: [place], [name] D. 01/27/2023 1:15 PM Medical Record Number: [id number] Patient Account Number: [id number] Date of Birth/Sex: Treating RN: 1956/07/02 (67 y.o. M) Primary Care Provider: Babs Sciara Other Clinician: Referring Provider: Treating Provider/Extender: Myriam Jacobson in Treatment: 11 Active Problems ICD-10 Encounter Code Description Active Date MDM Diagnosis L97.819 Non-pressure chronic ulcer of other part of right lower leg with unspecified 11/07/2022 No Yes severity E11.622 Type 2 diabetes mellitus with other skin ulcer 11/07/2022 No Yes I50.22 Chronic systolic (congestive) heart failure 11/07/2022 No Yes I73.9 Peripheral vascular disease, unspecified 11/07/2022 No Yes E11.42 Type 2 diabetes mellitus with diabetic polyneuropathy 11/07/2022 No Yes I63.9 Cerebral infarction, unspecified 11/07/2022 No Yes I10 Essential (primary) hypertension 11/07/2022 No Yes G89.4 Chronic pain syndrome 11/07/2022 No Yes L84 Corns and callosities 12/09/2022 No Yes L97.512 Non-pressure chronic ulcer of other part of right foot with fat layer exposed 11/07/2022 No Yes E11.621 Type 2 diabetes mellitus with foot ulcer 11/07/2022 No Yes Inactive Problems TEJUAN, GHOLSON ([id number]) [id number] Page 6 of 11 Resolved Problems ICD-10 Code Description Active Date Resolved Date L97.312 Non-pressure chronic ulcer of right ankle with fat layer exposed 11/07/2022 11/07/2022 Electronic Signature(s) Signed: 01/27/2023 1:52:44 PM By: Duanne Guess MD FACS Previous Signature: 01/27/2023 1:51:53 PM Version By: Duanne Guess MD FACS Entered By: Duanne Guess on 01/27/2023 13:52:44 -------------------------------------------------------------------------------- Progress Note Details Patient Name: Date of Service: [place]  RE, [name] D. 01/27/2023 1:15 PM Medical Record Number: [id number] Patient Account Number: [id number] Date of Birth/Sex: Treating RN: 28-Nov-1955 (67 y.o. M) Primary Care Provider: Babs Sciara Other Clinician: Referring Provider: Treating Provider/Extender: Myriam Jacobson in Treatment: 11 Subjective Chief Complaint Information obtained from Patient Patients presents for treatment of an open diabetic ulcer on his right foot, as well as ulcers on his right lower leg and ankle History of Present Illness (HPI) ADMISSION 11/07/2022 This is a 67 year old man with a past medical history significant for type 2 diabetes (last hemoglobin A1c 7.1), cerebrovascular occlusive disease, coronary artery disease, peripheral vascular disease, congestive heart failure, chronic pain syndrome, and prior amputation of his right great and second toes. He has a remote history of surgery on his right ankle and apparently as his legs have become increasingly more swollen and he has insisted upon wearing boots, the pressure and friction seem to have opened up wounds along the old scar line. In addition, he has a large callus on the third metatarsal head, related to his acquired foot deformity. He sees a podiatrist who periodically shaves this callus down and he was  also recommended orthotics, but the patient has refused them because he likes his boots. There is an area of discoloration in the lower portion of this callus, suspicious for an open wound. 11/14/2022: The ulcer on his third metatarsal head has closed. The lateral leg ulcers are both about the same size, but he says that they feel better. There is slough accumulation in both sites. 11/23/2022: The ulcer on his foot remains closed. The lateral leg ulcers are still unchanged in size, but are a bit cleaner, just with some slough accumulation. 12/01/2022: No significant change to the size of the wounds on his lateral leg. The larger is  quite clean with good granulation tissue present. The smaller still has fairly thick slough buildup. 12/09/2022: Both wounds are smaller today. They still have slough accumulation, but less so than at his previous visit. The callus on his foot has built up again and is uncomfortable. 12/16/2022: The wound at his ankle is nearly closed with just a little slough on the remaining open surface. The lateral leg wound is about the same size but has less slough accumulation. We are still working on Therapist, occupational for wound VAC. 12/23/2022: The wound is ankle is down to just a pinhole with a small amount of slough and eschar present. The lateral leg wound has contracted in length and is also shallower. There is slough on the surface. We still have not secured insurance approval for his wound VAC. 01/05/2023: The ankle wound is healed underneath a layer of eschar. The lateral leg wound is smaller and has some slough accumulation. He was finally approved for a wound VAC and has it with him today. 01/13/2023: The wound depth has come in considerably and the overall wound size has contracted as well. There is just a little slough on the surface. He is having an excellent response to negative pressure wound therapy. 01/19/2023: The wound is smaller and shallower again today. There is some slough accumulation on the surface. 01/27/2023: The wound is smaller again today and is essentially flush with the surrounding skin. The wound on his second metatarsal head has reopened under a thick layer of callus. Patient History Family History Cancer - Father,Mother,Siblings, Diabetes - Mother,Siblings, Heart Disease, Lung Disease - Father, No family history of Hereditary Spherocytosis, Hypertension, Kidney Disease, Seizures, Stroke, Thyroid Problems, Tuberculosis. Social History Former smoker - ended on 12/23/1970, Marital Status - Married, Alcohol Use - Never, Drug Use - No History, Caffeine Use - Daily. Medical  History Eyes Denies history of Cataracts, Glaucoma, Optic Neuritis Ear/Nose/Mouth/Throat Denies history of Chronic sinus problems/congestion, Middle ear problems PAOLA, FLYNT ([id number]) [id number] Page 7 of 11 Respiratory Denies history of Aspiration, Asthma, Chronic Obstructive Pulmonary Disease (COPD), Pneumothorax, Sleep Apnea, Tuberculosis Cardiovascular Patient has history of Congestive Heart Failure, Hypertension, Peripheral Venous Disease Endocrine Patient has history of Type II Diabetes Musculoskeletal Patient has history of Osteoarthritis Neurologic Patient has history of Neuropathy Oncologic Denies history of Received Chemotherapy, Received Radiation Psychiatric Denies history of Anorexia/bulimia, Confinement Anxiety Medical A Surgical History Notes nd Cardiovascular cardiomyopathy hyperlipidemia hypokalemia stroke Genitourinary orchitis Objective Constitutional no acute distress. Vitals Time Taken: 1:13 PM, Height: 74 in, Weight: 252 lbs, BMI: 32.4, Temperature: 98.6 F, Pulse: 78 bpm, Respiratory Rate: 18 breaths/min, Blood Pressure: 135/74 mmHg. Respiratory Normal work of breathing on room air. General Notes: 01/27/2023: The wound is smaller again today and is essentially flush with the surrounding skin. The wound on his second metatarsal head has reopened under a thick layer of  callus. Integumentary (Hair, Skin) Wound #1 status is Open. Original cause of wound was Shear/Friction. The date acquired was: 09/16/2022. The wound has been in treatment 11 weeks. The wound is located on the Right,Lateral Lower Leg. The wound measures 3.9cm length x 0.7cm width x 0.2cm depth; 2.144cm^2 area and 0.429cm^3 volume. There is Fat Layer (Subcutaneous Tissue) exposed. There is no tunneling or undermining noted. There is a medium amount of serosanguineous drainage noted. There is large (67-100%) red, hyper - granulation within the wound bed. There  is a small (1-33%) amount of necrotic tissue within the wound bed including Adherent Slough. The periwound skin appearance had no abnormalities noted for moisture. The periwound skin appearance exhibited: Scarring, Hemosiderin Staining. Periwound temperature was noted as No Abnormality. Wound #4 status is Open. Original cause of wound was Footwear Injury. The date acquired was: 01/23/2023. The wound is located on the Right Metatarsal head second. The wound measures 0.3cm length x 0.3cm width x 0.2cm depth; 0.071cm^2 area and 0.014cm^3 volume. There is Fat Layer (Subcutaneous Tissue) exposed. There is no tunneling or undermining noted. There is a medium amount of serosanguineous drainage noted. There is medium (34-66%) red granulation within the wound bed. There is a medium (34-66%) amount of necrotic tissue within the wound bed including Adherent Slough. The periwound skin appearance had no abnormalities noted for moisture. The periwound skin appearance exhibited: Callus, Hemosiderin Staining. The periwound skin appearance did not exhibit: Crepitus, Excoriation, Induration, Rash, Scarring, Atrophie Blanche, Cyanosis, Ecchymosis, Mottled, Pallor, Rubor, Erythema. Periwound temperature was noted as No Abnormality. Assessment Active Problems ICD-10 Non-pressure chronic ulcer of other part of right lower leg with unspecified severity Type 2 diabetes mellitus with other skin ulcer Chronic systolic (congestive) heart failure Peripheral vascular disease, unspecified Type 2 diabetes mellitus with diabetic polyneuropathy Cerebral infarction, unspecified Essential (primary) hypertension Chronic pain syndrome Corns and callosities Non-pressure chronic ulcer of other part of right foot with fat layer exposed Type 2 diabetes mellitus with foot ulcer Procedures TREVAUN, RENDLEMAN ([id number]) [id number] Page 8 of 11 Wound #1 Pre-procedure diagnosis of Wound #1 is a Trauma, Other  located on the Right,Lateral Lower Leg . There was a Excisional Skin/Subcutaneous Tissue Debridement with a total area of 2.73 sq cm performed by Duanne Guess, MD. With the following instrument(s): Curette to remove Non-Viable tissue/material. Material removed includes Subcutaneous Tissue and Slough and after achieving pain control using Lidocaine 5% topical ointment. No specimens were taken. A time out was conducted at 13:38, prior to the start of the procedure. A Minimum amount of bleeding was controlled with Pressure. The procedure was tolerated well with a pain level of 0 throughout and a pain level of 0 following the procedure. Post Debridement Measurements: 3.9cm length x 0.7cm width x 0.2cm depth; 0.429cm^3 volume. Character of Wound/Ulcer Post Debridement is improved. Post procedure Diagnosis Wound #1: Same as Pre-Procedure General Notes: Scribed for Dr. Lady Gary by J.Scotton. Pre-procedure diagnosis of Wound #1 is a Trauma, Other located on the Right,Lateral Lower Leg . There was a Three Layer Compression Therapy Procedure by Karie Schwalbe, RN. Post procedure Diagnosis Wound #1: Same as Pre-Procedure Wound #4 Pre-procedure diagnosis of Wound #4 is a Diabetic Wound/Ulcer of the Lower Extremity located on the Right Metatarsal head second .Severity of Tissue Pre Debridement is: Fat layer exposed. There was a Selective/Open Wound Non-Viable Tissue Debridement with a total area of 0.09 sq cm performed by Duanne Guess, MD. With the following instrument(s): Curette to remove Non-Viable tissue/material. Material removed includes Callus  and Slough and after achieving pain control using Lidocaine 5% topical ointment. No specimens were taken. A time out was conducted at 13:38, prior to the start of the procedure. A Minimum amount of bleeding was controlled with Pressure. The procedure was tolerated well with a pain level of 0 throughout and a pain level of 0 following the procedure. Post  Debridement Measurements: 0.3cm length x 0.3cm width x 0.2cm depth; 0.014cm^3 volume. Character of Wound/Ulcer Post Debridement is improved. Severity of Tissue Post Debridement is: Fat layer exposed. Post procedure Diagnosis Wound #4: Same as Pre-Procedure General Notes: Scribed for Dr. Lady Gary by J.Scotton. Plan Follow-up Appointments: Return Appointment in 1 week. - Dr. Lady Gary Room 3 Anesthetic: Wound #1 Right,Lateral Lower Leg: (In clinic) Topical Lidocaine 4% applied to wound bed Bathing/ Shower/ Hygiene: May shower with protection but do not get wound dressing(s) wet. Protect dressing(s) with water repellant cover (for example, large plastic bag) or a cast cover and may then take shower. - May purchase cast protector at [facility], [facility], or [facility] Negative Presssure Wound Therapy: Discontinue wound vac. Call the number on the machine for the company to pick up. Edema Control - Lymphedema / SCD / Other: Elevate legs to the level of the heart or above for 30 minutes daily and/or when sitting for 3-4 times a day throughout the day. Avoid standing for long periods of time. Off-Loading: Other: - Wear prescribed foot wear (Orthotics). Do not wear the boots. Home Health: Discontinue home health for wound care. - [facility] -discontinue WOUND #1: - Lower Leg Wound Laterality: Right, Lateral Cleanser: Soap and Water 1 x Per Week/30 Days Discharge Instructions: May shower and wash wound with dial antibacterial soap and water prior to dressing change. Cleanser: Wound Cleanser 1 x Per Week/30 Days Discharge Instructions: Cleanse the wound with wound cleanser prior to applying a clean dressing using gauze sponges, not tissue or cotton balls. Peri-Wound Care: Zinc Oxide Ointment 30g tube 1 x Per Week/30 Days Discharge Instructions: Apply Zinc Oxide to periwound with each dressing change Peri-Wound Care: Sween Lotion (Moisturizing lotion) 1 x Per Week/30 Days Discharge Instructions: Apply  moisturizing lotion as directed Prim Dressing: Promogran Prisma Matrix, 4.34 (sq in) (silver collagen) 1 x Per Week/30 Days ary Discharge Instructions: Moisten collagen with saline or hydrogel Secondary Dressing: Zetuvit Plus 4x8 in 1 x Per Week/30 Days Discharge Instructions: Apply over primary dressing as directed. Secured With: Transpore Surgical T ape, 2x10 (in/yd) 1 x Per Week/30 Days Discharge Instructions: Secure dressing with tape as directed. Com pression Wrap: ThreePress (3 layer compression wrap) 1 x Per Week/30 Days Discharge Instructions: Apply three layer compression as directed. WOUND #4: - Metatarsal head second Wound Laterality: Right Cleanser: Soap and Water Discharge Instructions: May shower and wash wound with dial antibacterial soap and water prior to dressing change. Cleanser: Wound Cleanser Discharge Instructions: Cleanse the wound with wound cleanser prior to applying a clean dressing using gauze sponges, not tissue or cotton balls. Prim Dressing: Maxorb Extra Ag+ Alginate Dressing, 2x2 (in/in) ary Discharge Instructions: Apply to wound bed as instructed Secondary Dressing: Optifoam Non-Adhesive Dressing, 4x4 in Discharge Instructions: Apply over primary dressing as directed. 01/27/2023: The wound is smaller again today and is essentially flush with the surrounding skin. The wound on his second metatarsal head has reopened under a thick layer of callus. I used a curette to debride slough and subcutaneous tissue from the lateral leg wound and callus and slough from the metatarsal head wound. We will apply silver alginate  to both and 3 layer compression. We will also use a thick foam donut to offload the plantar foot wound. Follow-up in 1 week. SAMPSON, SELF ([id number]) [id number] Page 9 of 11 Electronic Signature(s) Signed: 01/31/2023 5:13:02 PM By: Shawn Stall RN, BSN Signed: 02/01/2023 7:43:54 AM By: Duanne Guess MD FACS Previous  Signature: 01/27/2023 2:03:42 PM Version By: Duanne Guess MD FACS Entered By: Shawn Stall on 01/31/2023 17:01:07 -------------------------------------------------------------------------------- HxROS Details Patient Name: Date of Service: [name], [name] D. 01/27/2023 1:15 PM Medical Record Number: [id number] Patient Account Number: [id number] Date of Birth/Sex: Treating RN: 1956-07-06 (67 y.o. M) Primary Care Provider: Babs Sciara Other Clinician: Referring Provider: Treating Provider/Extender: Myriam Jacobson in Treatment: 11 Eyes Medical History: Negative for: Cataracts; Glaucoma; Optic Neuritis Ear/Nose/Mouth/Throat Medical History: Negative for: Chronic sinus problems/congestion; Middle ear problems Respiratory Medical History: Negative for: Aspiration; Asthma; Chronic Obstructive Pulmonary Disease (COPD); Pneumothorax; Sleep Apnea; Tuberculosis Cardiovascular Medical History: Positive for: Congestive Heart Failure; Hypertension; Peripheral Venous Disease Past Medical History Notes: cardiomyopathy hyperlipidemia hypokalemia stroke Endocrine Medical History: Positive for: Type II Diabetes Time with diabetes: 20 years Treated with: Insulin, Oral agents Genitourinary Medical History: Past Medical History Notes: orchitis Musculoskeletal Medical History: Positive for: Osteoarthritis Neurologic Medical History: Positive for: Neuropathy Oncologic Medical History: Negative for: Received Chemotherapy; Received Radiation Psychiatric Medical History: Negative for: Anorexia/bulimia; Confinement Anxiety Immunizations AZLAN, HANWAY ([id number]) [id number] Page 10 of 11 Pneumococcal Vaccine: Received Pneumococcal Vaccination: Yes Received Pneumococcal Vaccination On or After 60th Birthday: Yes Implantable Devices No devices added Family and Social History Cancer: Yes - Father,Mother,Siblings; Diabetes: Yes -  Mother,Siblings; Heart Disease: Yes; Hereditary Spherocytosis: No; Hypertension: No; Kidney Disease: No; Lung Disease: Yes - Father; Seizures: No; Stroke: No; Thyroid Problems: No; Tuberculosis: No; Former smoker - ended on 12/23/1970; Marital Status - Married; Alcohol Use: Never; Drug Use: No History; Caffeine Use: Daily; Financial Concerns: No; Food, Clothing or Shelter Needs: No; Support System Lacking: No; Transportation Concerns: No Electronic Signature(s) Signed: 01/27/2023 2:25:12 PM By: Duanne Guess MD FACS Entered By: Duanne Guess on 01/27/2023 14:02:03 -------------------------------------------------------------------------------- SuperBill Details Patient Name: Date of Service: [name], [name] D. 01/27/2023 Medical Record Number: [id number] Patient Account Number: [id number] Date of Birth/Sex: Treating RN: 07-Sep-1956 (67 y.o. M) Primary Care Provider: Babs Sciara Other Clinician: Referring Provider: Treating Provider/Extender: Myriam Jacobson in Treatment: 11 Diagnosis Coding ICD-10 Codes Code Description L97.819 Non-pressure chronic ulcer of other part of right lower leg with unspecified severity L97.512 Non-pressure chronic ulcer of other part of right foot with fat layer exposed E11.622 Type 2 diabetes mellitus with other skin ulcer E11.621 Type 2 diabetes mellitus with foot ulcer I50.22 Chronic systolic (congestive) heart failure I73.9 Peripheral vascular disease, unspecified E11.42 Type 2 diabetes mellitus with diabetic polyneuropathy I63.9 Cerebral infarction, unspecified I10 Essential (primary) hypertension G89.4 Chronic pain syndrome L84 Corns and callosities Facility Procedures : CPT4 Code: 62952841 Description: 11042 - DEB SUBQ TISSUE 20 SQ CM/< ICD-10 Diagnosis Description L97.819 Non-pressure chronic ulcer of other part of right lower leg with unspecified seve Modifier: rity Quantity: 1 : CPT4 Code: 32440102 Description:  97597 - DEBRIDE WOUND 1ST 20 SQ CM OR < ICD-10 Diagnosis Description L97.512 Non-pressure chronic ulcer of other part of right foot with fat layer exposed Modifier: Quantity: 1 Physician Procedures : CPT4 Code Description Modifier [id number] 99213 - WC PHYS LEVEL 3 - EST PT 25 ICD-10 Diagnosis Description L97.819 Non-pressure chronic ulcer of other part of right lower  leg with unspecified severity L97.512 Non-pressure chronic ulcer of other part of  right foot with fat layer exposed E11.622 Type 2 diabetes mellitus with other skin ulcer E11.621 Type 2 diabetes mellitus with foot ulcer Quantity: 1 : [id number] 11042 - WC PHYS SUBQ TISS 20 SQ CM ICD-10 Diagnosis Description L97.819 Non-pressure chronic ulcer of other part of right lower leg with unspecified severity JAILON, SCHAIBLE ([id number]) [phone] Quantity: 1 .pdf Page 11 of 11 : [id number] 97597 - WC PHYS DEBR WO ANESTH 20 SQ CM 1 ICD-10 Diagnosis Description L97.512 Non-pressure chronic ulcer of other part of right foot with fat layer exposed Quantity: Electronic Signature(s) Signed: 01/27/2023 2:04:11 PM By: Duanne Guess MD FACS Entered By: Duanne Guess on 01/27/2023 14:04:11

## 2023-01-27 NOTE — Progress Notes (Signed)
CLINT, [name] ([id number]) [id number] Page 1 of 8 Visit Report for 01/27/2023 Arrival Information Details Patient Name: Date of Service: [name], [name] 01/27/2023 1:15 PM Medical Record Number: [id number] Patient Account Number: [id number] Date of Birth/Sex: Treating RN: 04/04/1956 (67 y.o. [name] Primary Care [name]: [name] Other Clinician: Referring [name]: Treating [name]/Extender: [name] in Treatment: 11 Visit Information History Since Last Visit Added or deleted any medications: No Patient Arrived: Ambulatory Any new allergies or adverse reactions: No Arrival Time: 13:13 Had a fall or experienced change in No Accompanied By: spouse activities of daily living that may affect Transfer Assistance: None risk of falls: Patient Requires Transmission-Based Precautions: No Signs or symptoms of abuse/neglect since last visito No Patient Has Alerts: Yes Hospitalized since last visit: No Patient Alerts: ABI RLE [place] Implantable device outside of the clinic excluding No cellular tissue based products placed in the center since last visit: Has Dressing in Place as Prescribed: Yes Has Compression in Place as Prescribed: Yes Pain Present Now: No Electronic Signature(s) Signed: 01/27/2023 3:43:45 PM By: Karie Schwalbe RN Entered By: Karie [name] on 01/27/2023 13:15:14 -------------------------------------------------------------------------------- Compression Therapy Details Patient Name: Date of Service: [name] 01/27/2023 1:15 PM Medical Record Number: [id number] Patient Account Number: [id number] Date of Birth/Sex: Treating RN: 10-12-1956 (67 y.o. [name] Primary Care [name]: [name] Other Clinician: Referring [name]: Treating [name]/Extender: [name] in Treatment: 11 Compression Therapy Performed for Wound Assessment: Wound  #1 Right,Lateral Lower Leg Performed By: Clinician Karie Schwalbe, RN Compression Type: Three Layer Post Procedure Diagnosis Same as Pre-procedure Electronic Signature(s) Signed: 01/27/2023 3:43:45 PM By: Karie Schwalbe RN Entered By: Karie [name] on 01/27/2023 14:01:17 -------------------------------------------------------------------------------- Encounter Discharge Information Details Patient Name: Date of Service: [name], [name] Niemann D. 01/27/2023 1:15 PM Medical Record Number: [id number] Patient Account Number: [id number] Date of Birth/Sex: Treating RN: [birth year] (67 y.o. [name] Primary Care Braxon Suder: [name] Other Clinician: Referring [name]: Treating [name]/Extender: [name] in Treatment: 11 Encounter Discharge Information Items Post Procedure Vitals Discharge Condition: Stable Temperature (F): 98.6 Ambulatory Status: Ambulatory Pulse (bpm): 78 Discharge Destination: Home Respiratory Rate (breaths/min): 18 Transportation: Private Auto Blood Pressure (mmHg): 135/74 [name] ([id number]) [id number] Page 2 of 8 Accompanied By: spouse Schedule Follow-up Appointment: Yes Clinical Summary of Care: Patient Declined Electronic Signature(s) Signed: 01/27/2023 3:43:45 PM By: Karie Schwalbe RN Entered By: Karie [name] on 01/27/2023 15:40:31 -------------------------------------------------------------------------------- Lower Extremity Assessment Details Patient Name: Date of Service: [name] 01/27/2023 1:15 PM Medical Record Number: [id number] Patient Account Number: [id number] Date of Birth/Sex: Treating RN: 25-Mar-1956 (67 y.o. [name] Primary Care Margaurite Salido: [name] Other Clinician: Referring [name]: Treating [name]: [name] in Treatment: 11 Edema Assessment Assessed: [Left: No] [Right: No] [Left: Edema]  [Right: :] Calf Left: Right: Point of Measurement: From Medial Instep 39.1 cm Ankle Left: Right: Point of Measurement: From Medial Instep 25.8 cm Vascular Assessment Pulses: Dorsalis Pedis Palpable: [Right:Yes] Electronic Signature(s) Signed: 01/27/2023 3:43:45 PM By: Karie Schwalbe RN Entered By: Karie [name] on 01/27/2023 13:22:05 -------------------------------------------------------------------------------- Multi Wound Chart Details Patient Name: Date of Service: [name], [name] Niemann D. 01/27/2023 1:15 PM Medical Record Number: [id number] Patient Account Number: [id number] Date of Birth/Sex: Treating RN: 1956/08/01 (67 y.o. M) Primary Care [place]: [name] Other Clinician: Referring Chevella Pearce: Treating [name]: [name] Ramp  [name] in Treatment: 11 Vital Signs Height(in): 74 Pulse(bpm): 78 Weight(lbs): 252 Blood Pressure(mmHg): 135/74 Body Mass Index(BMI): 32.4 Temperature(F): 98.6 Respiratory Rate(breaths/min): 18 [1:Photos:] [N/A:N/A [id number] Page 3 of 8] Right, Lateral Lower Leg Right Metatarsal head second N/A Wound Location: Shear/Friction Footwear Injury N/A Wounding Event: Trauma, Other Diabetic Wound/Ulcer of the Lower N/A Primary Etiology: Extremity Congestive Heart Failure, Congestive Heart Failure, N/A Comorbid History: Hypertension, Peripheral Venous Hypertension, Peripheral Venous Disease, Type II Diabetes, Disease, Type II Diabetes, Osteoarthritis, Neuropathy Osteoarthritis, Neuropathy 09/16/2022 01/23/2023 N/A Date Acquired: 11 0 N/A [name] of Treatment: Open Open N/A Wound Status: No No N/A Wound Recurrence: 3.9x0.7x0.2 0.3x0.3x0.2 N/A Measurements L x W x D (cm) 2.144 0.071 N/A A (cm) : rea 0.429 0.014 N/A Volume (cm) : 49.40% N/A N/A % Reduction in A rea: 66.30% N/A N/A % Reduction in Volume: Full Thickness Without Exposed Grade 2 N/A Classification: Support  Structures Medium Medium N/A Exudate A mount: Serosanguineous Serosanguineous N/A Exudate Type: red, brown red, brown N/A Exudate Color: Large (67-100%) Medium (34-66%) N/A Granulation A mount: Red, Hyper-granulation Red N/A Granulation Quality: Small (1-33%) Medium (34-66%) N/A Necrotic A mount: Fat Layer (Subcutaneous Tissue): Yes Fat Layer (Subcutaneous Tissue): Yes N/A Exposed Structures: Fascia: No Fascia: No Tendon: No Tendon: No Muscle: No Muscle: No Joint: No Joint: No Bone: No Bone: No Small (1-33%) None N/A Epithelialization: Debridement - Excisional Debridement - Selective/Open Wound N/A Debridement: Pre-procedure Verification/Time Out 13:38 13:38 N/A Taken: Lidocaine 5% topical ointment Lidocaine 5% topical ointment N/A Pain Control: Subcutaneous, Slough Callus, Slough N/A Tissue Debrided: Skin/Subcutaneous Tissue Non-Viable Tissue N/A Level: 2.73 0.09 N/A Debridement A (sq cm): rea Curette Curette N/A Instrument: Minimum Minimum N/A Bleeding: Pressure Pressure N/A Hemostasis A chieved: 0 0 N/A Procedural Pain: 0 0 N/A Post Procedural Pain: Procedure was tolerated well Procedure was tolerated well N/A Debridement Treatment Response: 3.9x0.7x0.2 0.3x0.3x0.2 N/A Post Debridement Measurements L x W x D (cm) 0.429 0.014 N/A Post Debridement Volume: (cm) Scarring: Yes Callus: Yes N/A Periwound Skin Texture: Excoriation: No Induration: No Crepitus: No Rash: No Scarring: No No Abnormalities Noted Maceration: No N/A Periwound Skin Moisture: Dry/Scaly: No Hemosiderin Staining: Yes Hemosiderin Staining: Yes N/A Periwound Skin Color: Atrophie Blanche: No Cyanosis: No Ecchymosis: No Erythema: No Mottled: No Pallor: No Rubor: No No Abnormality No Abnormality N/A Temperature: Debridement Debridement N/A Procedures Performed: Treatment Notes Electronic Signature(s) Signed: 01/27/2023 2:01:01 PM By: Duanne Guess MD FACS Previous  Signature: 01/27/2023 1:52:13 PM Version By: Duanne Guess MD FACS Entered By: Duanne Guess on 01/27/2023 14:01:00 -------------------------------------------------------------------------------- Multi-Disciplinary Care Plan Details Patient Name: Date of Service: [place] [name], [name] Niemann D. 01/27/2023 1:15 PM Medical Record Number: [id number] Patient Account Number: [id number] OPIE, MACLAUGHLIN ([id number]) [id number] Page 4 of 8 Date of Birth/Sex: Treating RN: 01-29-1956 (67 y.o. [name] Primary Care Nezzie Manera: Other Clinician: Babs [name] Referring Sevyn Markham: Treating Ansley Stanwood/Extender: [name] in Treatment: 11 Multidisciplinary Care Plan reviewed with physician Active Inactive Venous Leg Ulcer Nursing Diagnoses: Knowledge deficit related to disease process and management Potential for venous Insuffiency (use before diagnosis confirmed) Goals: Patient will maintain optimal edema control Date Initiated: 11/23/2022 Target Resolution Date: 05/16/2023 Goal Status: Active Interventions: Assess peripheral edema status every visit. Compression as ordered Treatment Activities: Therapeutic compression applied : 11/23/2022 Notes: Wound/Skin Impairment Nursing Diagnoses: Impaired tissue integrity Goals: Patient/caregiver will verbalize understanding of skin care regimen Date Initiated: 11/23/2022 Target Resolution Date: 05/16/2023 Goal Status: Active Ulcer/skin breakdown will have a volume reduction of  30% by week 4 Date Initiated: 11/07/2022 Target Resolution Date: 05/16/2023 Goal Status: Active Interventions: Assess patient/caregiver ability to obtain necessary supplies Assess patient/caregiver ability to perform ulcer/skin care regimen upon admission and as needed Assess ulceration(s) every visit Treatment Activities: Skin care regimen initiated : 11/07/2022 Topical wound management initiated :  11/07/2022 Notes: Electronic Signature(s) Signed: 01/27/2023 3:43:45 PM By: Karie Schwalbe RN Entered By: Karie [name] on 01/27/2023 15:39:00 -------------------------------------------------------------------------------- Pain Assessment Details Patient Name: Date of Service: [name] 01/27/2023 1:15 PM Medical Record Number: [id number] Patient Account Number: [id number] Date of Birth/Sex: Treating RN: June 12, 1956 (67 y.o. [name] Primary Care Kabeer Hoagland: [name] Other Clinician: Referring Onie Kasparek: Treating Sahith Nurse/Extender: [name] in Treatment: 11 Active Problems Location of Pain Severity and Description of Pain Patient Has Paino No Site Locations AKIEM, KOSKIE ([id number]) [phone].pdf Page 5 of 8 Pain Management and Medication Current Pain Management: Electronic Signature(s) Signed: 01/27/2023 3:43:45 PM By: Karie Schwalbe RN Entered By: Karie [name] on 01/27/2023 13:15:54 -------------------------------------------------------------------------------- Patient/Caregiver Education Details Patient Name: Date of Service: [name], [name] 4/12/2024andnbsp1:15 PM Medical Record Number: [id number] Patient Account Number: [id number] Date of Birth/Gender: Treating RN: [birth year] (67 y.o. [name] Primary Care Physician: [name] Other Clinician: Referring Physician: Treating Physician/Extender: [name] in Treatment: 11 Education Assessment Education Provided To: Patient Education Topics Provided Wound/Skin Impairment: Methods: Explain/Verbal Responses: Return demonstration correctly Electronic Signature(s) Signed: 01/27/2023 3:43:45 PM By: Karie Schwalbe RN Entered By: Karie [name] on 01/27/2023 15:39:17 -------------------------------------------------------------------------------- Wound Assessment Details Patient Name: Date of  Service: [name] 01/27/2023 1:15 PM Medical Record Number: [id number] Patient Account Number: [id number] Date of Birth/Sex: Treating RN: Dec 08, 1955 (67 y.o. [name] Primary Care Fifi Schindler: [name] Other Clinician: Referring Milina Pagett: Treating Bruna Dills/Extender: [name] in Treatment: 11 Wound Status Wound Number: 1 Primary Trauma, Other Etiology: Wound Location: Right, Lateral Lower Leg Wound Open Wounding Event: Shear/Friction Status: Date Acquired: 09/16/2022 Comorbid Congestive Heart Failure, Hypertension, Peripheral Venous [name] Of Treatment: 11 History: Disease, Type II Diabetes, Osteoarthritis, Neuropathy Clustered Wound: No AXTYN, BELLIDO ([id number]) [id number] Page 6 of 8 Photos Wound Measurements Length: (cm) 3.9 Width: (cm) 0.7 Depth: (cm) 0.2 Area: (cm) 2.144 Volume: (cm) 0.429 % Reduction in Area: 49.4% % Reduction in Volume: 66.3% Epithelialization: Small (1-33%) Tunneling: No Undermining: No Wound Description Classification: Full Thickness Without Exposed Support Structures Exudate Amount: Medium Exudate Type: Serosanguineous Exudate Color: red, brown Foul Odor After Cleansing: No Slough/Fibrino Yes Wound Bed Granulation Amount: Large (67-100%) Exposed Structure Granulation Quality: Red, Hyper-granulation Fascia Exposed: No Necrotic Amount: Small (1-33%) Fat Layer (Subcutaneous Tissue) Exposed: Yes Necrotic Quality: Adherent Slough Tendon Exposed: No Muscle Exposed: No Joint Exposed: No Bone Exposed: No Periwound Skin Texture Texture Color No Abnormalities Noted: No No Abnormalities Noted: No Scarring: Yes Hemosiderin Staining: Yes Moisture Temperature / Pain No Abnormalities Noted: Yes Temperature: No Abnormality Treatment Notes Wound #1 (Lower Leg) Wound Laterality: Right, Lateral Cleanser Soap and Water Discharge Instruction: May shower and wash wound  with dial antibacterial soap and water prior to dressing change. Wound Cleanser Discharge Instruction: Cleanse the wound with wound cleanser prior to applying a clean dressing using gauze sponges, not tissue or cotton balls. Peri-Wound Care Zinc Oxide Ointment 30g tube Discharge Instruction: Apply Zinc Oxide to periwound with each dressing change Sween Lotion (Moisturizing lotion) Discharge Instruction: Apply moisturizing lotion as directed Topical Primary Dressing Promogran  Prisma Matrix, 4.34 (sq in) (silver collagen) Discharge Instruction: Moisten collagen with saline or hydrogel Secondary Dressing Zetuvit Plus 4x8 in Discharge Instruction: Apply over primary dressing as directed. Secured With Transpore Surgical Tape, 2x10 (in/yd) Discharge Instruction: Secure dressing with tape as directed. [name], [name] ([id number]) [id number] Page 7 of 8 Compression Wrap ThreePress (3 layer compression wrap) Discharge Instruction: Apply three layer compression as directed. Compression Stockings Add-Ons Electronic Signature(s) Signed: 01/27/2023 3:43:45 PM By: Karie Schwalbe RN Entered By: Karie [name] on 01/27/2023 13:20:19 -------------------------------------------------------------------------------- Wound Assessment Details Patient Name: Date of Service: [name] 01/27/2023 1:15 PM Medical Record Number: [id number] Patient Account Number: [id number] Date of Birth/Sex: Treating RN: 1956/10/02 (67 y.o. [name] Primary Care Janat Tabbert: [name] Other Clinician: Referring Niobe Dick: Treating Demeka Sutter/Extender: [name] in Treatment: 11 Wound Status Wound Number: 4 Primary Diabetic Wound/Ulcer of the Lower Extremity Etiology: Wound Location: Right Metatarsal head second Wound Open Wounding Event: Footwear Injury Status: Date Acquired: 01/23/2023 Comorbid Congestive Heart Failure, Hypertension,  Peripheral Venous [name] Of Treatment: 0 History: Disease, Type II Diabetes, Osteoarthritis, Neuropathy Clustered Wound: No Photos Wound Measurements Length: (cm) 0.3 Width: (cm) 0.3 Depth: (cm) 0.2 Area: (cm) 0.071 Volume: (cm) 0.014 % Reduction in Area: % Reduction in Volume: Epithelialization: None Tunneling: No Undermining: No Wound Description Classification: Grade 2 Exudate Amount: Medium Exudate Type: Serosanguineous Exudate Color: red, brown Foul Odor After Cleansing: No Slough/Fibrino Yes Wound Bed Granulation Amount: Medium (34-66%) Exposed Structure Granulation Quality: Red Fascia Exposed: No Necrotic Amount: Medium (34-66%) Fat Layer (Subcutaneous Tissue) Exposed: Yes Necrotic Quality: Adherent Slough Tendon Exposed: No Muscle Exposed: No Joint Exposed: No Bone Exposed: No Periwound Skin Texture Texture Color No Abnormalities Noted: No No Abnormalities Noted: No Callus: Yes [address]OR[address], [name] ([id number]) [id number] Page 8 of 8 Crepitus: No Cyanosis: No Excoriation: No Ecchymosis: No Induration: No Erythema: No Rash: No Hemosiderin Staining: Yes Scarring: No Mottled: No Pallor: No Moisture Rubor: No No Abnormalities Noted: Yes Temperature / Pain Temperature: No Abnormality Treatment Notes Wound #4 (Metatarsal head second) Wound Laterality: Right Cleanser Soap and Water Discharge Instruction: May shower and wash wound with dial antibacterial soap and water prior to dressing change. Wound Cleanser Discharge Instruction: Cleanse the wound with wound cleanser prior to applying a clean dressing using gauze sponges, not tissue or cotton balls. Peri-Wound Care Topical Primary Dressing Maxorb Extra Ag+ Alginate Dressing, 2x2 (in/in) Discharge Instruction: Apply to wound bed as instructed Secondary Dressing Optifoam Non-Adhesive Dressing, 4x4 in Discharge Instruction: Apply over primary dressing as  directed. Secured With Compression Wrap Compression Stockings Facilities manager) Signed: 01/27/2023 3:43:45 PM By: Karie Schwalbe RN Entered By: Karie [name] on 01/27/2023 13:51:58 -------------------------------------------------------------------------------- Vitals Details Patient Name: Date of Service: [name], [name] Niemann D. 01/27/2023 1:15 PM Medical Record Number: [id number] Patient Account Number: [id number] Date of Birth/Sex: Treating RN: 01-22-1956 (67 y.o. [name] Primary Care Eladio [place]rem[place]: [name] Other Clinician: Referring Maribeth Jiles: Treating Carlia Bomkamp/Extender: [name] in Treatment: 11 Vital Signs Time Taken: 13:13 Temperature (F): 98.6 Height (in): 74 Pulse (bpm): 78 Weight (lbs): 252 Respiratory Rate (breaths/min): 18 Body Mass Index (BMI): 32.4 Blood Pressure (mmHg): 135/74 Reference Range: 80 - 120 mg / dl Electronic Signature(s) Signed: 01/27/2023 3:43:45 PM By: Karie Schwalbe RN Entered By: Karie [name] on 01/27/2023 13:15:41

## 2023-02-02 NOTE — Progress Notes (Signed)
SARGENT, MANKEY ([id number]) [id number] Page 1 of 4 Visit Report for 01/03/2023 Arrival Information Details Patient Name: Date of Service: [name], [name] 01/03/2023 11:15 A M Medical Record Number: [id number] Patient Account Number: [id number] Date of Birth/Sex: Treating RN: 05/21/1956 (67 y.o. M) Primary Care Nekisha Mcdiarmid: Babs Sciara Other Clinician: Referring Baeleigh Devincent: Treating Alizabeth Antonio/Extender: Selinda Orion in Treatment: 8 Visit Information History Since Last Visit All ordered tests and consults were completed: No Patient Arrived: Ambulatory Added or deleted any medications: No Arrival Time: 11:20 Any new allergies or adverse reactions: No Accompanied By: wife Had a fall or experienced change in No Transfer Assistance: None activities of daily living that may affect Patient Identification Verified: Yes risk of falls: Secondary Verification Process Completed: Yes Signs or symptoms of abuse/neglect since last visito No Patient Requires Transmission-Based Precautions: No Hospitalized since last visit: No Patient Has Alerts: Yes Implantable device outside of the clinic excluding No Patient Alerts: ABI RLE [place] cellular tissue based products placed in the center since last visit: Pain Present Now: No Electronic Signature(s) Signed: 01/04/2023 9:04:37 AM By: Dayton Scrape Entered By: Dayton Scrape on 01/03/2023 11:20:44 -------------------------------------------------------------------------------- Compression Therapy Details Patient Name: Date of Service: [name] Ellison 01/03/2023 11:15 A M Medical Record Number: [id number] Patient Account Number: [id number] Date of Birth/Sex: Treating RN: 06-06-1956 (67 y.o. Damaris Schooner Primary Care Skya Mccullum: Babs Sciara Other Clinician: Referring Wrigley Winborne: Treating Alyiah Ulloa/Extender: Sandrea Hughs Weeks in Treatment: 8 Compression Therapy Performed for  Wound Assessment: Wound #1 Right,Lateral Lower Leg Performed By: Clinician Brenton Grills, RN Compression Type: Double Layer Electronic Signature(s) Signed: 01/03/2023 4:17:28 PM By: Zenaida Deed RN, BSN Entered By: Zenaida Deed on 01/03/2023 11:51:12 -------------------------------------------------------------------------------- Encounter Discharge Information Details Patient Name: Date of Service: [name], [name] Niemann D. 01/03/2023 11:15 A M Medical Record Number: [id number] Patient Account Number: [id number] Date of Birth/Sex: Treating RN: Nov 19, 1955 (67 y.o. [name] Primary Care Killian Ress: Babs Sciara Other Clinician: Referring Mahrukh Seguin: Treating Preciliano Castell/Extender: Selinda Orion in Treatment: 8 Encounter Discharge Information Items Discharge Condition: Stable Ambulatory Status: Ambulatory Discharge Destination: Home Transportation: Private Auto Accompanied By: spouse Schedule Follow-up Appointment: Yes Clinical Summary of Care: Patient [name], [name] ([id number]) [phone].pdf Page 2 of 4 Electronic Signature(s) Signed: 02/02/2023 1:55:51 PM By: Brenton Grills Entered By: Brenton Grills on 01/03/2023 11:43:24 -------------------------------------------------------------------------------- Patient/Caregiver Education Details Patient Name: Date of Service: [name], [name]. 3/19/2024andnbsp11:15 A M Medical Record Number: [id number] Patient Account Number: [id number] Date of Birth/Gender: Treating RN: 04-Jan-1956 (67 y.o. [name] Primary Care Physician: Babs Sciara Other Clinician: Referring Physician: Treating Physician/Extender: Selinda Orion in Treatment: 8 Education Assessment Education Provided To: Patient Education Topics Provided Wound/Skin Impairment: Methods: Demonstration, Explain/Verbal Responses: Reinforcements needed, State content  correctly Electronic Signature(s) Signed: 02/02/2023 1:55:51 PM By: Brenton Grills Entered By: Brenton Grills on 01/03/2023 11:43:01 -------------------------------------------------------------------------------- Wound Assessment Details Patient Name: Date of Service: [name] Ellison. 01/03/2023 11:15 A M Medical Record Number: [id number] Patient Account Number: [id number] Date of Birth/Sex: Treating RN: [birth year] (67 y.o. [name] Primary Care Duey Liller: Babs Sciara Other Clinician: Referring Ife Vitelli: Treating Sherma Vanmetre/Extender: Glenford Bayley A Weeks in Treatment: 8 Wound Status Wound Number: 1 Primary Trauma, Other Etiology: Wound Location: Right, Lateral Lower Leg Wound Open Wounding Event: Shear/Friction Status: Date Acquired: 09/16/2022 Comorbid Congestive Heart Failure, Hypertension, Peripheral Venous Weeks Of Treatment: 8 History:  Disease, Type II Diabetes, Osteoarthritis, Neuropathy Clustered Wound: No Wound Measurements Length: (cm) 4.8 Width: (cm) 1.3 Depth: (cm) 0.3 Area: (cm) 4.901 Volume: (cm) 1.47 % Reduction in Area: -15.6% % Reduction in Volume: -15.6% Epithelialization: Small (1-33%) Tunneling: No Undermining: No Wound Description Classification: Full Thickness Without Exposed Suppor Exudate Amount: Medium Exudate Type: Serosanguineous Exudate Color: red, brown t Structures Foul Odor After Cleansing: No Slough/Fibrino Yes Wound Bed Granulation Amount: Large (67-100%) Exposed Structure Granulation Quality: Red Fascia Exposed: No Necrotic Amount: Small (1-33%) Fat Layer (Subcutaneous Tissue) Exposed: Yes Necrotic Quality: Eschar, Adherent Slough Tendon Exposed: No Muscle Exposed: No Joint Exposed: No [name], [name] ([id number]) [id number] Page 3 of 4 Bone Exposed: No Periwound Skin Texture Texture Color No Abnormalities Noted: No No Abnormalities Noted: No Scarring: Yes Hemosiderin  Staining: Yes Moisture Temperature / Pain No Abnormalities Noted: Yes Temperature: No Abnormality Electronic Signature(s) Signed: 02/02/2023 1:55:51 PM By: Brenton Grills Entered By: Brenton Grills on 01/03/2023 11:38:44 -------------------------------------------------------------------------------- Wound Assessment Details Patient Name: Date of Service: [name] Kendall D. 01/03/2023 11:15 A M Medical Record Number: [id number] Patient Account Number: [id number] Date of Birth/Sex: Treating RN: 1955-11-18 (67 y.o. [name] Primary Care [place] Zaragoza: Babs Sciara Other Clinician: Referring Yancy Knoble: Treating Kiylee Thoreson/Extender: Glenford Bayley A Weeks in Treatment: 8 Wound Status Wound Number: 2 Primary Trauma, Other Etiology: Wound Location: Right, Lateral Ankle Wound Open Wounding Event: Shear/Friction Status: Date Acquired: 09/16/2022 Comorbid Congestive Heart Failure, Hypertension, Peripheral Venous Weeks Of Treatment: 8 History: Disease, Type II Diabetes, Osteoarthritis, Neuropathy Clustered Wound: No Wound Measurements Length: (cm) 0.3 Width: (cm) 0.1 Depth: (cm) 0.1 Area: (cm) 0.024 Volume: (cm) 0.002 % Reduction in Area: 96.6% % Reduction in Volume: 98.6% Epithelialization: Small (1-33%) Tunneling: No Undermining: No Wound Description Classification: Full Thickness Without Exposed Suppor Exudate Amount: Medium Exudate Type: Serous Exudate Color: amber t Structures Foul Odor After Cleansing: No Slough/Fibrino Yes Wound Bed Granulation Amount: Small (1-33%) Exposed Structure Granulation Quality: Red, Pink Fascia Exposed: No Necrotic Amount: Large (67-100%) Fat Layer (Subcutaneous Tissue) Exposed: Yes Necrotic Quality: Adherent Slough Tendon Exposed: No Muscle Exposed: No Joint Exposed: No Bone Exposed: No Periwound Skin Texture Texture Color No Abnormalities Noted: No No Abnormalities Noted: No Scarring: Yes Hemosiderin Staining:  Yes Moisture Temperature / Pain No Abnormalities Noted: Yes Temperature: No Abnormality Electronic Signature(s) Signed: 02/02/2023 1:55:51 PM By: Brenton Grills Entered By: Brenton Grills on 01/03/2023 11:38:53 Vitals Details -------------------------------------------------------------------------------- [name] ([id number]) [id number] Page 4 of 4 Patient Name: Date of Service: [name], [name] 01/03/2023 11:15 A M Medical Record Number: [id number] Patient Account Number: [id number] Date of Birth/Sex: Treating RN: [birth year] (67 y.o. M) Primary Care Gayle Martinez: Babs Sciara Other Clinician: Referring Zebulen Simonis: Treating Milanna Kozlov/Extender: Illene Bolus, Scott A Weeks in Treatment: 8 Vital Signs Time Taken: 11:05 Temperature (F): 98.6 Height (in): 74 Pulse (bpm): 81 Weight (lbs): 252 Respiratory Rate (breaths/min): 20 Body Mass Index (BMI): 32.4 Blood Pressure (mmHg): 127/81 Capillary Blood Glucose (mg/dl): 629 Reference Range: 80 - 120 mg / dl Electronic Signature(s) Signed: 01/04/2023 9:04:37 AM By: Dayton Scrape Entered By: Dayton Scrape on 01/03/2023 11:21:31

## 2023-02-06 ENCOUNTER — Encounter (HOSPITAL_BASED_OUTPATIENT_CLINIC_OR_DEPARTMENT_OTHER): Payer: Medicare HMO | Admitting: General Surgery

## 2023-02-06 DIAGNOSIS — I429 Cardiomyopathy, unspecified: Secondary | ICD-10-CM | POA: Diagnosis not present

## 2023-02-06 DIAGNOSIS — S81801A Unspecified open wound, right lower leg, initial encounter: Secondary | ICD-10-CM | POA: Diagnosis not present

## 2023-02-06 DIAGNOSIS — L97512 Non-pressure chronic ulcer of other part of right foot with fat layer exposed: Secondary | ICD-10-CM | POA: Diagnosis not present

## 2023-02-06 DIAGNOSIS — E11622 Type 2 diabetes mellitus with other skin ulcer: Secondary | ICD-10-CM | POA: Diagnosis not present

## 2023-02-06 DIAGNOSIS — L84 Corns and callosities: Secondary | ICD-10-CM | POA: Diagnosis not present

## 2023-02-06 DIAGNOSIS — E11621 Type 2 diabetes mellitus with foot ulcer: Secondary | ICD-10-CM | POA: Diagnosis not present

## 2023-02-06 DIAGNOSIS — L97312 Non-pressure chronic ulcer of right ankle with fat layer exposed: Secondary | ICD-10-CM | POA: Diagnosis not present

## 2023-02-06 DIAGNOSIS — I110 Hypertensive heart disease with heart failure: Secondary | ICD-10-CM | POA: Diagnosis not present

## 2023-02-06 DIAGNOSIS — I5022 Chronic systolic (congestive) heart failure: Secondary | ICD-10-CM | POA: Diagnosis not present

## 2023-02-06 DIAGNOSIS — Z833 Family history of diabetes mellitus: Secondary | ICD-10-CM | POA: Diagnosis not present

## 2023-02-06 DIAGNOSIS — L97919 Non-pressure chronic ulcer of unspecified part of right lower leg with unspecified severity: Secondary | ICD-10-CM | POA: Diagnosis not present

## 2023-02-06 DIAGNOSIS — G894 Chronic pain syndrome: Secondary | ICD-10-CM | POA: Diagnosis not present

## 2023-02-06 DIAGNOSIS — E1142 Type 2 diabetes mellitus with diabetic polyneuropathy: Secondary | ICD-10-CM | POA: Diagnosis not present

## 2023-02-06 DIAGNOSIS — I2510 Atherosclerotic heart disease of native coronary artery without angina pectoris: Secondary | ICD-10-CM | POA: Diagnosis not present

## 2023-02-06 DIAGNOSIS — E1151 Type 2 diabetes mellitus with diabetic peripheral angiopathy without gangrene: Secondary | ICD-10-CM | POA: Diagnosis not present

## 2023-02-07 ENCOUNTER — Telehealth: Payer: Self-pay | Admitting: Family Medicine

## 2023-02-07 NOTE — Telephone Encounter (Signed)
Called patient to schedule Medicare Annual Wellness Visit (AWV). Left message for patient to call back and schedule Medicare Annual Wellness Visit (AWV).  Last date of AWV: 07/13/2021   Please schedule an appointment at any time with Toni Amend, [facility] .  If any questions, please contact me at [phone].  Thank you,  Judeth Cornfield,  AMB Clinical Support [facility] AWV Program Direct Dial ??[phone]

## 2023-02-07 NOTE — Progress Notes (Signed)
AHMAR, [name] ([id number]) [id number] Page 1 of 8 Visit Report for 02/06/2023 Arrival Information Details Patient Name: Date of Service: [place] [name], [name] 02/06/2023 2:45 PM Medical Record Number: [id number] Patient Account Number: [id number] Date of Birth/Sex: Treating RN: [birth year] (67 y.o. M) Primary Care [name]: [name] Other Clinician: Referring [name]: Treating [name]/Extender: [name] in Treatment: 13 Visit Information History Since Last Visit All ordered tests and consults were completed: No Patient Arrived: Ambulatory Added or deleted any medications: No Arrival Time: 15:02 Any new allergies or adverse reactions: No Accompanied By: wife Had a fall or experienced change in No Transfer Assistance: None activities of daily living that may affect Patient Identification Verified: Yes risk of falls: Secondary Verification Process Completed: Yes Signs or symptoms of abuse/neglect since last visito No Patient Requires Transmission-Based Precautions: No Hospitalized since last visit: No Patient Has Alerts: Yes Implantable device outside of the clinic excluding No Patient Alerts: ABI RLE [place] cellular tissue based products placed in the center since last visit: Has Dressing in Place as Prescribed: Yes Has Compression in Place as Prescribed: Yes Pain Present Now: No Electronic Signature(s) Signed: 02/06/2023 5:51:03 PM By: [name] Deed RN, BSN Entered By: [name] on 02/06/2023 15:08:23 -------------------------------------------------------------------------------- Compression Therapy Details Patient Name: Date of Service: [name], [name] Niemann D. 02/06/2023 2:45 PM Medical Record Number: [id number] Patient Account Number: [id number] Date of Birth/Sex: Treating RN: November 08, 1955 (67 y.o. [name] Primary Care Terryl Molinelli: [name] Other Clinician: Referring Danel Requena: Treating  [name]/Extender: [name] in Treatment: 13 Compression Therapy Performed for Wound Assessment: Wound #1 Right,Lateral Lower Leg Performed By: Clinician [name] Deed, RN Compression Type: Double Layer Post Procedure Diagnosis Same as Pre-procedure Electronic Signature(s) Signed: 02/06/2023 5:51:03 PM By: [name] Deed RN, BSN Entered By: [name] on 02/06/2023 15:27:29 -------------------------------------------------------------------------------- Encounter Discharge Information Details Patient Name: Date of Service: [name], [name] Niemann D. 02/06/2023 2:45 PM Medical Record Number: [id number] Patient Account Number: [id number] Date of Birth/Sex: Treating RN: December 29, 1955 (67 y.o. [name] Primary Care Danae Oland: [name] Other Clinician: Referring Meah Jiron: Treating Delawrence Fridman/Extender: [name] in Treatment: 13 Encounter Discharge Information Items Post Procedure Vitals Discharge Condition: Stable Temperature (F): 98.3 Ambulatory Status: Ambulatory Pulse (bpm): 68 Discharge Destination: Home Respiratory Rate (breaths/min): [address] ([id number]) [id number] Page 2 of 8 Transportation: Private Auto Blood Pressure (mmHg): 119/68 Accompanied By: spouse Schedule Follow-up Appointment: Yes Clinical Summary of Care: Patient Declined Electronic Signature(s) Signed: 02/06/2023 5:51:03 PM By: [name] Deed RN, BSN Entered By: [name] on 02/06/2023 15:44:38 -------------------------------------------------------------------------------- Lower Extremity Assessment Details Patient Name: Date of Service: [name], [name]. 02/06/2023 2:45 PM Medical Record Number: [id number] Patient Account Number: [id number] Date of Birth/Sex: Treating RN: 11/13/1955 (67 y.o. [name] Primary Care Orvis Stann: [name] Other Clinician: Referring Sedric Guia: Treating  Darol Cush/Extender: [name] in Treatment: 13 Edema Assessment Assessed: [Left: No] [Right: No] [Left: Edema] [Right: :] Calf Left: Right: Point of Measurement: From Medial Instep 43 cm Ankle Left: Right: Point of Measurement: From Medial Instep 26.3 cm Vascular Assessment Pulses: Dorsalis Pedis Palpable: [Right:No] Electronic Signature(s) Signed: 02/06/2023 5:51:03 PM By: [name] Deed RN, BSN Entered By: [name] on 02/06/2023 15:09:24 -------------------------------------------------------------------------------- Multi Wound Chart Details Patient Name: Date of Service: [name], [name] Niemann D. 02/06/2023 2:45 PM Medical Record Number: [id number] Patient Account Number: [id number] Date of Birth/Sex: Treating RN: October 26, 1955 (67  y.o. M) Primary Care Maeby Vankleeck: [name] Other Clinician: Referring Oreatha Fabry: Treating Noelani Harbach/Extender: [name] in Treatment: 13 Vital Signs Height(in): 74 Pulse(bpm): 68 Weight(lbs): 252 Blood Pressure(mmHg): 119/68 Body Mass Index(BMI): 32.4 Temperature(F): 98.3 Respiratory Rate(breaths/min): 18 [1:Photos:] [N/A:N/A [id number] Page 3 of 8] Right, Lateral Lower Leg Right Metatarsal head second N/A Wound Location: Shear/Friction Footwear Injury N/A Wounding Event: Trauma, Other Diabetic Wound/Ulcer of the Lower N/A Primary Etiology: Extremity Congestive Heart Failure, Congestive Heart Failure, N/A Comorbid History: Hypertension, Peripheral Venous Hypertension, Peripheral Venous Disease, Type II Diabetes, Disease, Type II Diabetes, Osteoarthritis, Neuropathy Osteoarthritis, Neuropathy 09/16/2022 01/23/2023 N/A Date Acquired: 13 1 N/A [name] of Treatment: Open Open N/A Wound Status: No No N/A Wound Recurrence: 4x0.9x0.2 0x0x0 N/A Measurements L x W x D (cm) 2.827 0 N/A A (cm) : rea 0.565 0 N/A Volume (cm) : 33.30% 100.00% N/A %  Reduction in A rea: 55.60% 100.00% N/A % Reduction in Volume: Full Thickness Without Exposed Grade 2 N/A Classification: Support Structures Medium None Present N/A Exudate A mount: Serosanguineous N/A N/A Exudate Type: red, brown N/A N/A Exudate Color: Distinct, outline attached N/A N/A Wound Margin: Large (67-100%) None Present (0%) N/A Granulation A mount: Pink N/A N/A Granulation Quality: Small (1-33%) None Present (0%) N/A Necrotic A mount: Fat Layer (Subcutaneous Tissue): Yes Fascia: No N/A Exposed Structures: Fascia: No Fat Layer (Subcutaneous Tissue): No Tendon: No Tendon: No Muscle: No Muscle: No Joint: No Joint: No Bone: No Bone: No Small (1-33%) Large (67-100%) N/A Epithelialization: Debridement - Excisional N/A N/A Debridement: Pre-procedure Verification/Time Out 15:25 N/A N/A Taken: Lidocaine 4% Topical Solution N/A N/A Pain Control: Subcutaneous, Slough N/A N/A Tissue Debrided: Skin/Subcutaneous Tissue N/A N/A Level: 2.83 N/A N/A Debridement A (sq cm): rea Curette N/A N/A Instrument: Minimum N/A N/A Bleeding: Pressure N/A N/A Hemostasis A chieved: 0 N/A N/A Procedural Pain: 0 N/A N/A Post Procedural Pain: Procedure was tolerated well N/A N/A Debridement Treatment Response: 4x0.9x0.2 N/A N/A Post Debridement Measurements L x W x D (cm) 0.565 N/A N/A Post Debridement Volume: (cm) Scarring: Yes Callus: Yes N/A Periwound Skin Texture: Excoriation: No Induration: No Crepitus: No Rash: No Scarring: No No Abnormalities Noted Maceration: No N/A Periwound Skin Moisture: Dry/Scaly: No Hemosiderin Staining: Yes Atrophie Blanche: No N/A Periwound Skin Color: Cyanosis: No Ecchymosis: No Erythema: No Hemosiderin Staining: No Mottled: No Pallor: No Rubor: No No Abnormality No Abnormality N/A Temperature: Compression Therapy N/A N/A Procedures Performed: Debridement Treatment Notes Electronic Signature(s) Signed: 02/06/2023  3:30:18 PM By: Duanne Guess MD FACS Entered By: Duanne Guess on 02/06/2023 15:30:18 Multi-Disciplinary Care Plan Details -------------------------------------------------------------------------------- Benson Norway ([id number]) [id number] Page 4 of 8 Patient Name: Date of Service: [place] [name], [name] 02/06/2023 2:45 PM Medical Record Number: [id number] Patient Account Number: [id number] Date of Birth/Sex: Treating RN: 01/29/1956 (67 y.o. [name] Primary Care Lilias Lorensen: [name] Other Clinician: Referring Rayetta Veith: Treating Abi Shoults/Extender: [name] in Treatment: 13 Multidisciplinary Care Plan reviewed with physician Active Inactive Venous Leg Ulcer Nursing Diagnoses: Knowledge deficit related to disease process and management Potential for venous Insuffiency (use before diagnosis confirmed) Goals: Patient will maintain optimal edema control Date Initiated: 11/23/2022 Target Resolution Date: 05/16/2023 Goal Status: Active Interventions: Assess peripheral edema status every visit. Compression as ordered Treatment Activities: Therapeutic compression applied : 11/23/2022 Notes: Wound/Skin Impairment Nursing Diagnoses: Impaired tissue integrity Goals: Patient/caregiver will verbalize understanding of skin care regimen Date Initiated: 11/23/2022 Target Resolution Date: 05/16/2023 Goal Status: Active  Ulcer/skin breakdown will have a volume reduction of 30% by week 4 Date Initiated: 11/07/2022 Target Resolution Date: 05/16/2023 Goal Status: Active Interventions: Assess patient/caregiver ability to obtain necessary supplies Assess patient/caregiver ability to perform ulcer/skin care regimen upon admission and as needed Assess ulceration(s) every visit Treatment Activities: Skin care regimen initiated : 11/07/2022 Topical wound management initiated : 11/07/2022 Notes: Electronic Signature(s) Signed:  02/06/2023 5:51:03 PM By: [name] Deed RN, BSN Entered By: [name] on 02/06/2023 15:18:07 -------------------------------------------------------------------------------- Pain Assessment Details Patient Name: Date of Service: [name], [name] Niemann D. 02/06/2023 2:45 PM Medical Record Number: [id number] Patient Account Number: [id number] Date of Birth/Sex: Treating RN: 03-11-1956 (67 y.o. M) Primary Care Wynnie Pacetti: [name] Other Clinician: Referring Ace Bergfeld: Treating Maimouna Rondeau/Extender: [name] in Treatment: 13 Active Problems Location of Pain Severity and Description of Pain Patient Has Paino No DENMAN, PICHARDO ([id number]) [id number] Page 5 of 8 Patient Has Paino No Site Locations Pain Management and Medication Current Pain Management: Electronic Signature(s) Signed: 02/06/2023 3:20:08 PM By: Dayton Scrape Entered By: Dayton Scrape on 02/06/2023 15:03:45 -------------------------------------------------------------------------------- Patient/Caregiver Education Details Patient Name: Date of Service: [place] [name], [name] 4/22/2024andnbsp2:45 PM Medical Record Number: [id number] Patient Account Number: [id number] Date of Birth/Gender: Treating RN: 03/22/1956 (67 y.o. [name] Primary Care Physician: [name] Other Clinician: Referring Physician: Treating Physician/Extender: [name] in Treatment: 13 Education Assessment Education Provided To: Patient Education Topics Provided Venous: Methods: Explain/Verbal Responses: Reinforcements needed, State content correctly Wound/Skin Impairment: Methods: Explain/Verbal Responses: Reinforcements needed, State content correctly Electronic Signature(s) Signed: 02/06/2023 5:51:03 PM By: [name] Deed RN, BSN Entered By: [name] on 02/06/2023  15:18:31 -------------------------------------------------------------------------------- Wound Assessment Details Patient Name: Date of Service: [place] [name], [name] Niemann D. 02/06/2023 2:45 PM Medical Record Number: [id number] Patient Account Number: [id number] Date of Birth/Sex: Treating RN: 1956-09-28 (67 y.o. [name] Primary Care Londen Lorge: [name] Other Clinician: Referring Brannen Koppen: Treating Nyal Schachter/Extender: [name] in Treatment: [address] DAMEION, BRILES ([id number]) [id number] Page 6 of 8 Wound Number: 1 Primary Trauma, Other Etiology: Wound Location: Right, Lateral Lower Leg Wound Open Wounding Event: Shear/Friction Status: Date Acquired: 09/16/2022 Comorbid Congestive Heart Failure, Hypertension, Peripheral Venous [name] Of Treatment: 13 History: Disease, Type II Diabetes, Osteoarthritis, Neuropathy Clustered Wound: No Photos Wound Measurements Length: (cm) 4 Width: (cm) 0.9 Depth: (cm) 0.2 Area: (cm) 2.827 Volume: (cm) 0.565 % Reduction in Area: 33.3% % Reduction in Volume: 55.6% Epithelialization: Small (1-33%) Tunneling: No Undermining: No Wound Description Classification: Full Thickness Without Exposed Support Structures Wound Margin: Distinct, outline attached Exudate Amount: Medium Exudate Type: Serosanguineous Exudate Color: red, brown Foul Odor After Cleansing: No Slough/Fibrino Yes Wound Bed Granulation Amount: Large (67-100%) Exposed Structure Granulation Quality: Pink Fascia Exposed: No Necrotic Amount: Small (1-33%) Fat Layer (Subcutaneous Tissue) Exposed: Yes Necrotic Quality: Adherent Slough Tendon Exposed: No Muscle Exposed: No Joint Exposed: No Bone Exposed: No Periwound Skin Texture Texture Color No Abnormalities Noted: No No Abnormalities Noted: No Scarring: Yes Hemosiderin Staining: Yes Moisture Temperature / Pain No Abnormalities Noted: Yes Temperature:  No Abnormality Treatment Notes Wound #1 (Lower Leg) Wound Laterality: Right, Lateral Cleanser Soap and Water Discharge Instruction: May shower and wash wound with dial antibacterial soap and water prior to dressing change. Wound Cleanser Discharge Instruction: Cleanse the wound with wound cleanser prior to applying a clean dressing using gauze sponges, not tissue or cotton balls. Peri-Wound Care Zinc Oxide Ointment 30g tube  Discharge Instruction: Apply Zinc Oxide to periwound with each dressing change Sween Lotion (Moisturizing lotion) Discharge Instruction: Apply moisturizing lotion as directed Topical Primary Dressing Maxorb Extra Ag+ Alginate Dressing, 2x2 (in/in) Discharge Instruction: Apply to wound bed as instructed DELMORE, SEAR ([id number]) [id number] Page 7 of 8 Secondary Dressing ABD Pad, 5x9 Discharge Instruction: Apply over primary dressing as directed. Woven Gauze Sponge, Non-Sterile 4x4 in Discharge Instruction: Apply over primary dressing as directed. Secured With Compression Wrap ThreePress (3 layer compression wrap) Discharge Instruction: Apply three layer compression as directed. Compression Stockings Add-Ons Electronic Signature(s) Signed: 02/06/2023 5:51:03 PM By: [name] Deed RN, BSN Entered By: [name] on 02/06/2023 15:15:24 -------------------------------------------------------------------------------- Wound Assessment Details Patient Name: Date of Service: [place] [name], [name] Niemann D. 02/06/2023 2:45 PM Medical Record Number: [id number] Patient Account Number: [id number] Date of Birth/Sex: Treating RN: 1956-01-03 (67 y.o. [name] Primary Care Kameren Baade: [name] Other Clinician: Referring Teeghan Hammer: Treating Laasya Peyton/Extender: [name] in Treatment: 13 Wound Status Wound Number: 4 Primary Diabetic Wound/Ulcer of the Lower Extremity Etiology: Wound Location: Right Metatarsal  head second Wound Open Wounding Event: Footwear Injury Status: Date Acquired: 01/23/2023 Comorbid Congestive Heart Failure, Hypertension, Peripheral Venous [name] Of Treatment: 1 History: Disease, Type II Diabetes, Osteoarthritis, Neuropathy Clustered Wound: No Photos Wound Measurements Length: (cm) Width: (cm) Depth: (cm) Area: (cm) Volume: (cm) 0 % Reduction in Area: 100% 0 % Reduction in Volume: 100% 0 Epithelialization: Large (67-100%) 0 Tunneling: No 0 Undermining: No Wound Description Classification: Grade 2 Exudate Amount: None Present Foul Odor After Cleansing: No Slough/Fibrino No Wound Bed Granulation Amount: None Present (0%) Exposed Structure Necrotic Amount: None Present (0%) Fascia Exposed: No Fat Layer (Subcutaneous Tissue) Exposed: No Tendon Exposed: No Muscle Exposed: No Joint Exposed: No Bone Exposed: No JANSSEN, ZEE ([id number]) [id number] Page 8 of 8 Periwound Skin Texture Texture Color No Abnormalities Noted: No No Abnormalities Noted: No Callus: Yes Atrophie Blanche: No Crepitus: No Cyanosis: No Excoriation: No Ecchymosis: No Induration: No Erythema: No Rash: No Hemosiderin Staining: No Scarring: No Mottled: No Pallor: No Moisture Rubor: No No Abnormalities Noted: Yes Temperature / Pain Temperature: No Abnormality Electronic Signature(s) Signed: 02/06/2023 5:51:03 PM By: [name] Deed RN, BSN Entered By: [name] on 02/06/2023 15:15:58 -------------------------------------------------------------------------------- Vitals Details Patient Name: Date of Service: [place] [name], [name] Niemann D. 02/06/2023 2:45 PM Medical Record Number: [id number] Patient Account Number: [id number] Date of Birth/Sex: Treating RN: 10-16-1956 (67 y.o. M) Primary Care Charis Juliana: [name] Other Clinician: Referring Hulda Reddix: Treating Ayen Viviano/Extender: [name] in Treatment: 13 Vital  Signs Time Taken: 03:03 Temperature (F): 98.3 Height (in): 74 Pulse (bpm): 68 Weight (lbs): 252 Respiratory Rate (breaths/min): 18 Body Mass Index (BMI): 32.4 Blood Pressure (mmHg): 119/68 Reference Range: 80 - 120 mg / dl Electronic Signature(s) Signed: 02/06/2023 3:20:08 PM By: [place] Scrape Entered By: [place] Scrape on 02/06/2023 15:03:37

## 2023-02-07 NOTE — Progress Notes (Signed)
GAVON, MAJANO ([id number]) [id number] Page 1 of 9 Visit Report for 02/06/2023 Chief Complaint Document Details Patient Name: Date of Service: [name] 02/06/2023 2:45 PM Medical Record Number: [id number] Patient Account Number: [id number] Date of Birth/Sex: Treating RN: 05/06/1956 (67 y.o. M) Primary Care Provider: Babs Sciara Other Clinician: Referring Provider: Treating Provider/Extender: Myriam Jacobson in Treatment: 13 Information Obtained from: Patient Chief Complaint Patients presents for treatment of an open diabetic ulcer on his right foot, as well as ulcers on his right lower leg and ankle Electronic Signature(s) Signed: 02/06/2023 3:30:28 PM By: Duanne Guess MD FACS Entered By: Duanne Guess on 02/06/2023 15:30:28 -------------------------------------------------------------------------------- Debridement Details Patient Name: Date of Service: [name], [name] Niemann D. 02/06/2023 2:45 PM Medical Record Number: [id number] Patient Account Number: [id number] Date of Birth/Sex: Treating RN: 02-Jul-1956 (67 y.o. [name] Primary Care Provider: Babs Sciara Other Clinician: Referring Provider: Treating Provider/Extender: Myriam Jacobson in Treatment: 13 Debridement Performed for Assessment: Wound #1 Right,Lateral Lower Leg Performed By: Physician Duanne Guess, MD Debridement Type: Debridement Level of Consciousness (Pre-procedure): Awake and Alert Pre-procedure Verification/Time Out Yes - 15:25 Taken: Start Time: 15:27 Pain Control: Lidocaine 4% T opical Solution Percent of Wound Bed Debrided: 100% T Area Debrided (cm): otal 2.83 Tissue and other material debrided: Viable, Non-Viable, Slough, Subcutaneous, Slough Level: Skin/Subcutaneous Tissue Debridement Description: Excisional Instrument: Curette Bleeding: Minimum Hemostasis Achieved: Pressure Procedural Pain:  0 Post Procedural Pain: 0 Response to Treatment: Procedure was tolerated well Level of Consciousness (Post- Awake and Alert procedure): Post Debridement Measurements of Total Wound Length: (cm) 4 Width: (cm) 0.9 Depth: (cm) 0.2 Volume: (cm) 0.565 Character of Wound/Ulcer Post Debridement: Improved Post Procedure Diagnosis Same as Pre-procedure Notes Scribed for Dr. Lady Gary by Zenaida Deed, RN Electronic Signature(s) Signed: 02/06/2023 3:55:54 PM By: Duanne Guess MD FACS Signed: 02/06/2023 5:51:03 PM By: Zenaida Deed RN, BSN [place] ([id number]) [id number].pdf Page 2 of 9 Entered By: Zenaida Deed on 02/06/2023 15:29:04 -------------------------------------------------------------------------------- HPI Details Patient Name: Date of Service: [name], [name] 02/06/2023 2:45 PM Medical Record Number: [id number] Patient Account Number: [id number] Date of Birth/Sex: Treating RN: February 11, 1956 (67 y.o. 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 He has a remote history of surgery on his right ankle and apparently as his legs have become increasingly more swollen and he has insisted upon wearing boots, the pressure and friction seem to have opened up wounds along the old scar line. In addition, he has a large callus on the third metatarsal head, related to his acquired foot deformity. He sees a podiatrist who periodically shaves this callus down and he was also  recommended orthotics, but the patient has refused them because he likes his boots. There is an area of discoloration in the lower portion of this callus, suspicious for an open wound. 11/14/2022: The ulcer on his third metatarsal head has closed. The lateral leg ulcers are both about the same size, but he says that they feel better. There is slough accumulation in both sites. 11/23/2022: The ulcer on his foot  remains closed. The lateral leg ulcers are still unchanged in size, but are a bit cleaner, just with some slough accumulation. 12/01/2022: No significant change to the size of the wounds on his lateral leg. The larger is quite clean with good granulation tissue present. The smaller still has fairly thick slough buildup. 12/09/2022: Both wounds are smaller today. They still have slough accumulation, but less so than at his previous visit. The callus on his foot has built up again and is uncomfortable. 12/16/2022: The wound at his ankle is nearly closed with just a little slough on the remaining open surface. The lateral leg wound is about the same size but has less slough accumulation. We are still working on Therapist, occupational for wound VAC. 12/23/2022: The wound is ankle is down to just a pinhole with a small amount of slough and eschar present. The lateral leg wound has contracted in length and is also shallower. There is slough on the surface. We still have not secured insurance approval for his wound VAC. 01/05/2023: The ankle wound is healed underneath a layer of eschar. The lateral leg wound is smaller and has some slough accumulation. He was finally approved for a wound VAC and has it with him today. 01/13/2023: The wound depth has come in considerably and the overall wound size has contracted as well. There is just a little slough on the surface. He is having an excellent response to negative pressure wound therapy. 01/19/2023: The wound is smaller and shallower again today. There is some slough  accumulation on the surface. 01/27/2023: The wound is smaller again today and is essentially flush with the surrounding skin. The wound on his second metatarsal head has reopened under a thick layer of callus. 02/06/2023: The foot wound has healed. The lateral leg wound is smaller today with some slough accumulation. Electronic Signature(s) Signed: 02/06/2023 3:31:55 PM By: Duanne Guess MD FACS Entered By: Duanne Guess on 02/06/2023 15:31:55 -------------------------------------------------------------------------------- Physical Exam Details Patient Name: Date of Service: [name], [name] 02/06/2023 2:45 PM Medical Record Number: [id number] Patient Account Number: [id number] Date of Birth/Sex: Treating RN: Mar 11, 1956 (67 y.o. M) Primary Care Provider: Babs Sciara Other Clinician: Referring Provider: Treating Provider/Extender: Marchelle Folks, Scott A Weeks in Treatment: 13 Constitutional . . . . no acute distress. Respiratory Normal work of breathing on room air. Notes 02/06/2023: The foot wound has healed. The lateral leg wound is smaller today with some slough accumulation. RISHIT, BURKHALTER ([id number]) [id number] Page 3 of 9 Electronic Signature(s) Signed: 02/06/2023 3:32:42 PM By: Duanne Guess MD FACS Entered By: Duanne Guess on 02/06/2023 15:32:42 -------------------------------------------------------------------------------- Physician Orders Details Patient Name: Date of Service: [name], [name] Niemann D. 02/06/2023 2:45 PM Medical Record Number: [id number] Patient Account Number: [id number] Date of Birth/Sex: Treating RN: 10/16/1956 (67 y.o. [name] Primary Care Provider: Babs Sciara Other Clinician: Referring Provider: Treating Provider/Extender: Myriam Jacobson in Treatment: 34 Verbal / Phone Orders: No Diagnosis Coding ICD-10 Coding Code Description L97.819 Non-pressure chronic ulcer  of other part of right lower leg with unspecified severity E11.622 Type 2 diabetes mellitus with other skin ulcer E11.621 Type 2 diabetes mellitus with foot ulcer I50.22 Chronic systolic (congestive) heart failure I73.9 Peripheral vascular disease, unspecified E11.42 Type 2 diabetes mellitus with diabetic polyneuropathy I63.9 Cerebral infarction, unspecified I10 Essential (primary) hypertension G89.4 Chronic pain syndrome L84 Corns and callosities Follow-up Appointments ppointment in 1 week. - Dr. Lady Gary Room 3 Return A Tuesday 4/30 @ 09:45  am Anesthetic Wound #1 Right,Lateral Lower Leg (In clinic) Topical Lidocaine 4% applied to wound bed Bathing/ Shower/ Hygiene May shower with protection but do not get wound dressing(s) wet. Protect dressing(s) with water repellant cover (for example, large plastic bag) or a cast cover and may then take shower. - May purchase cast protector at [facility], [facility], or [facility] Edema Control - Lymphedema / SCD / Other Right Lower Extremity Elevate legs to the level of the heart or above for 30 minutes daily and/or when sitting for 3-4 times a day throughout the day. Avoid standing for long periods of time. Exercise regularly Off-Loading Other: - Wear prescribed foot wear (Orthotics). Do not wear the boots. Wound Treatment Wound #1 - Lower Leg Wound Laterality: Right, Lateral Cleanser: Soap and Water 1 x Per Week/30 Days Discharge Instructions: May shower and wash wound with dial antibacterial soap and water prior to dressing change. Cleanser: Wound Cleanser 1 x Per Week/30 Days Discharge Instructions: Cleanse the wound with wound cleanser prior to applying a clean dressing using gauze sponges, not tissue or cotton balls. Peri-Wound Care: Zinc Oxide Ointment 30g tube 1 x Per Week/30 Days Discharge Instructions: Apply Zinc Oxide to periwound with each dressing change Peri-Wound Care: Sween Lotion (Moisturizing lotion) 1 x Per Week/30 Days Discharge  Instructions: Apply moisturizing lotion as directed Prim Dressing: Maxorb Extra Ag+ Alginate Dressing, 2x2 (in/in) 1 x Per Week/30 Days ary Discharge Instructions: Apply to wound bed as instructed Secondary Dressing: ABD Pad, 5x9 1 x Per Week/30 Days Discharge Instructions: Apply over primary dressing as directed. [name], [name] ([id number]) [id number] Page 4 of 9 Secondary Dressing: Woven Gauze Sponge, Non-Sterile 4x4 in 1 x Per Week/30 Days Discharge Instructions: Apply over primary dressing as directed. Compression Wrap: ThreePress (3 layer compression wrap) 1 x Per Week/30 Days Discharge Instructions: Apply three layer compression as directed. Electronic Signature(s) Signed: 02/06/2023 3:55:54 PM By: Duanne Guess MD FACS Entered By: Duanne Guess on 02/06/2023 15:34:26 -------------------------------------------------------------------------------- Problem List Details Patient Name: Date of Service: [name], [name] Niemann D. 02/06/2023 2:45 PM Medical Record Number: [id number] Patient Account Number: [id number] Date of Birth/Sex: Treating RN: 08/31/1956 (66 y.o. [name] Primary Care Provider: Babs Sciara Other Clinician: Referring Provider: Treating Provider/Extender: Myriam Jacobson in Treatment: 13 Active Problems ICD-10 Encounter Code Description Active Date MDM Diagnosis L97.819 Non-pressure chronic ulcer of other part of right lower leg with unspecified 11/07/2022 No Yes severity E11.622 Type 2 diabetes mellitus with other skin ulcer 11/07/2022 No Yes E11.621 Type 2 diabetes mellitus with foot ulcer 11/07/2022 No Yes I50.22 Chronic systolic (congestive) heart failure 11/07/2022 No Yes I73.9 Peripheral vascular disease, unspecified 11/07/2022 No Yes E11.42 Type 2 diabetes mellitus with diabetic polyneuropathy 11/07/2022 No Yes I63.9 Cerebral infarction, unspecified 11/07/2022 No Yes I10 Essential (primary)  hypertension 11/07/2022 No Yes G89.4 Chronic pain syndrome 11/07/2022 No Yes L84 Corns and callosities 12/09/2022 No Yes Inactive Problems ICD-10 Code Description Active Date Inactive Date [name], [name] ([id number]) [id number] Page 5 of 9 L97.512 Non-pressure chronic ulcer of other part of right foot with fat layer exposed 11/07/2022 11/07/2022 Resolved Problems ICD-10 Code Description Active Date Resolved Date L97.312 Non-pressure chronic ulcer of right ankle with fat layer exposed 11/07/2022 11/07/2022 Electronic Signature(s) Signed: 02/06/2023 3:30:09 PM By: Duanne Guess MD FACS Entered By: Duanne Guess on 02/06/2023 15:30:09 -------------------------------------------------------------------------------- Progress Note Details Patient Name: Date of Service: [name], [name] Niemann D. 02/06/2023 2:45 PM Medical Record Number: [id number] Patient Account Number: [id number] Date  of Birth/Sex: Treating RN: Aug 21, 1956 (67 y.o. M) Primary Care Provider: Babs Sciara Other Clinician: Referring Provider: Treating Provider/Extender: Myriam Jacobson in Treatment: 13 Subjective Chief Complaint Information obtained from Patient Patients presents for treatment of an open diabetic ulcer on his right foot, as well as ulcers on his right lower leg and ankle History of Present Illness (HPI) ADMISSION 11/07/2022 This is a 67 year old man with a past medical history significant for type 2 diabetes (last hemoglobin A1c 7.1), cerebrovascular occlusive disease, coronary artery disease, peripheral vascular disease, congestive heart failure, chronic pain syndrome, and prior amputation of his right great and second toes. He has a remote history of surgery on his right ankle and apparently as his legs have become increasingly more swollen and he has insisted upon wearing boots, the pressure and friction seem to have opened up wounds along the old scar line. In  addition, he has a large callus on the third metatarsal head, related to his acquired foot deformity. He sees a podiatrist who periodically shaves this callus down and he was also recommended orthotics, but the patient has refused them because he likes his boots. There is an area of discoloration in the lower portion of this callus, suspicious for an open wound. 11/14/2022: The ulcer on his third metatarsal head has closed. The lateral leg ulcers are both about the same size, but he says that they feel better. There is slough accumulation in both sites. 11/23/2022: The ulcer on his foot remains closed. The lateral leg ulcers are still unchanged in size, but are a bit cleaner, just with some slough accumulation. 12/01/2022: No significant change to the size of the wounds on his lateral leg. The larger is quite clean with good granulation tissue present. The smaller still has fairly thick slough buildup. 12/09/2022: Both wounds are smaller today. They still have slough accumulation, but less so than at his previous visit. The callus on his foot has built up again and is uncomfortable. 12/16/2022: The wound at his ankle is nearly closed with just a little slough on the remaining open surface. The lateral leg wound is about the same size but has less slough accumulation. We are still working on Therapist, occupational for wound VAC. 12/23/2022: The wound is ankle is down to just a pinhole with a small amount of slough and eschar present. The lateral leg wound has contracted in length and is also shallower. There is slough on the surface. We still have not secured insurance approval for his wound VAC. 01/05/2023: The ankle wound is healed underneath a layer of eschar. The lateral leg wound is smaller and has some slough accumulation. He was finally approved for a wound VAC and has it with him today. 01/13/2023: The wound depth has come in considerably and the overall wound size has contracted as well. There is just a  little slough on the surface. He is having an excellent response to negative pressure wound therapy. 01/19/2023: The wound is smaller and shallower again today. There is some slough accumulation on the surface. 01/27/2023: The wound is smaller again today and is essentially flush with the surrounding skin. The wound on his second metatarsal head has reopened under a thick layer of callus. 02/06/2023: The foot wound has healed. The lateral leg wound is smaller today with some slough accumulation. Patient History Family History Cancer - Father,Mother,Siblings, Diabetes - Mother,Siblings, Heart Disease, Lung Disease - Father, No family history of Hereditary Spherocytosis, Hypertension, Kidney Disease, Seizures, Stroke, Thyroid  Problems, Tuberculosis. Social History Former smoker - ended on 12/23/1970, Marital Status - Married, Alcohol Use - Never, Drug Use - No History, Caffeine Use - Daily. [name], [name] ([id number]) [id number] Page 6 of 9 Medical History Eyes Denies history of Cataracts, Glaucoma, Optic Neuritis Ear/Nose/Mouth/Throat Denies history of Chronic sinus problems/congestion, Middle ear problems Respiratory Denies history of Aspiration, Asthma, Chronic Obstructive Pulmonary Disease (COPD), Pneumothorax, Sleep Apnea, Tuberculosis Cardiovascular Patient has history of Congestive Heart Failure, Hypertension, Peripheral Venous Disease Endocrine Patient has history of Type II Diabetes Musculoskeletal Patient has history of Osteoarthritis Neurologic Patient has history of Neuropathy Oncologic Denies history of Received Chemotherapy, Received Radiation Psychiatric Denies history of Anorexia/bulimia, Confinement Anxiety Medical A Surgical History Notes nd Cardiovascular cardiomyopathy hyperlipidemia hypokalemia stroke Genitourinary orchitis Objective Constitutional no acute distress. Vitals Time Taken: 3:03 AM, Height: 74 in, Weight: 252 lbs, BMI:  32.4, Temperature: 98.3 F, Pulse: 68 bpm, Respiratory Rate: 18 breaths/min, Blood Pressure: 119/68 mmHg. Respiratory Normal work of breathing on room air. General Notes: 02/06/2023: The foot wound has healed. The lateral leg wound is smaller today with some slough accumulation. Integumentary (Hair, Skin) Wound #1 status is Open. Original cause of wound was Shear/Friction. The date acquired was: 09/16/2022. The wound has been in treatment 13 weeks. The wound is located on the Right,Lateral Lower Leg. The wound measures 4cm length x 0.9cm width x 0.2cm depth; 2.827cm^2 area and 0.565cm^3 volume. There is Fat Layer (Subcutaneous Tissue) exposed. There is no tunneling or undermining noted. There is a medium amount of serosanguineous drainage noted. The wound margin is distinct with the outline attached to the wound base. There is large (67-100%) pink granulation within the wound bed. There is a small (1-33%) amount of necrotic tissue within the wound bed including Adherent Slough. The periwound skin appearance had no abnormalities noted for moisture. The periwound skin appearance exhibited: Scarring, Hemosiderin Staining. Periwound temperature was noted as No Abnormality. Wound #4 status is Open. Original cause of wound was Footwear Injury. The date acquired was: 01/23/2023. The wound has been in treatment 1 weeks. The wound is located on the Right Metatarsal head second. The wound measures 0cm length x 0cm width x 0cm depth; 0cm^2 area and 0cm^3 volume. There is no tunneling or undermining noted. There is a none present amount of drainage noted. There is no granulation within the wound bed. There is no necrotic tissue within the wound bed. The periwound skin appearance had no abnormalities noted for moisture. The periwound skin appearance exhibited: Callus. The periwound skin appearance did not exhibit: Crepitus, Excoriation, Induration, Rash, Scarring, Atrophie Blanche, Cyanosis, Ecchymosis, Hemosiderin  Staining, Mottled, Pallor, Rubor, Erythema. Periwound temperature was noted as No Abnormality. Assessment Active Problems ICD-10 Non-pressure chronic ulcer of other part of right lower leg with unspecified severity Type 2 diabetes mellitus with other skin ulcer Type 2 diabetes mellitus with foot ulcer Chronic systolic (congestive) heart failure Peripheral vascular disease, unspecified Type 2 diabetes mellitus with diabetic polyneuropathy Cerebral infarction, unspecified Essential (primary) hypertension Chronic pain syndrome Corns and callosities [name], [name] ([id number]) [id number] Page 7 of 9 Procedures Wound #1 Pre-procedure diagnosis of Wound #1 is a Trauma, Other located on the Right,Lateral Lower Leg . There was a Excisional Skin/Subcutaneous Tissue Debridement with a total area of 2.83 sq cm performed by Duanne Guess, MD. With the following instrument(s): Curette to remove Viable and Non-Viable tissue/material. Material removed includes Subcutaneous Tissue and Slough and after achieving pain control using Lidocaine 4% T opical Solution. No specimens were  taken. A time out was conducted at 15:25, prior to the start of the procedure. A Minimum amount of bleeding was controlled with Pressure. The procedure was tolerated well with a pain level of 0 throughout and a pain level of 0 following the procedure. Post Debridement Measurements: 4cm length x 0.9cm width x 0.2cm depth; 0.565cm^3 volume. Character of Wound/Ulcer Post Debridement is improved. Post procedure Diagnosis Wound #1: Same as Pre-Procedure General Notes: Scribed for Dr. Lady Gary by Zenaida Deed, RN. Pre-procedure diagnosis of Wound #1 is a Trauma, Other located on the Right,Lateral Lower Leg . There was a Double Layer Compression Therapy Procedure by Zenaida Deed, RN. Post procedure Diagnosis Wound #1: Same as Pre-Procedure Plan Follow-up Appointments: Return Appointment in 1 week.  - Dr. Lady Gary Room 3 Tuesday 4/30 @ 09:45 am Anesthetic: Wound #1 Right,Lateral Lower Leg: (In clinic) Topical Lidocaine 4% applied to wound bed Bathing/ Shower/ Hygiene: May shower with protection but do not get wound dressing(s) wet. Protect dressing(s) with water repellant cover (for example, large plastic bag) or a cast cover and may then take shower. - May purchase cast protector at [facility], [facility], or [facility] Edema Control - Lymphedema / SCD / Other: Elevate legs to the level of the heart or above for 30 minutes daily and/or when sitting for 3-4 times a day throughout the day. Avoid standing for long periods of time. Exercise regularly Off-Loading: Other: - Wear prescribed foot wear (Orthotics). Do not wear the boots. WOUND #1: - Lower Leg Wound Laterality: Right, Lateral Cleanser: Soap and Water 1 x Per Week/30 Days Discharge Instructions: May shower and wash wound with dial antibacterial soap and water prior to dressing change. Cleanser: Wound Cleanser 1 x Per Week/30 Days Discharge Instructions: Cleanse the wound with wound cleanser prior to applying a clean dressing using gauze sponges, not tissue or cotton balls. Peri-Wound Care: Zinc Oxide Ointment 30g tube 1 x Per Week/30 Days Discharge Instructions: Apply Zinc Oxide to periwound with each dressing change Peri-Wound Care: Sween Lotion (Moisturizing lotion) 1 x Per Week/30 Days Discharge Instructions: Apply moisturizing lotion as directed Prim Dressing: Maxorb Extra Ag+ Alginate Dressing, 2x2 (in/in) 1 x Per Week/30 Days ary Discharge Instructions: Apply to wound bed as instructed Secondary Dressing: ABD Pad, 5x9 1 x Per Week/30 Days Discharge Instructions: Apply over primary dressing as directed. Secondary Dressing: Woven Gauze Sponge, Non-Sterile 4x4 in 1 x Per Week/30 Days Discharge Instructions: Apply over primary dressing as directed. Com pression Wrap: ThreePress (3 layer compression wrap) 1 x Per Week/30 Days Discharge  Instructions: Apply three layer compression as directed. 02/06/2023: The foot wound has healed. The lateral leg wound is smaller today with some slough accumulation. I used a curette to debride slough and subcutaneous tissue from the wound. We will apply silver alginate to the wound bed and 3 layer compression for edema control. Follow-up in 1 week. Electronic Signature(s) Signed: 02/06/2023 3:34:59 PM By: Duanne Guess MD FACS Entered By: Duanne Guess on 02/06/2023 15:34:58 -------------------------------------------------------------------------------- HxROS Details Patient Name: Date of Service: [name], [name] Niemann D. 02/06/2023 2:45 PM Medical Record Number: [id number] Patient Account Number: [id number] Date of Birth/Sex: Treating RN: Jul 02, 1956 (67 y.o. M) Primary Care Provider: Babs Sciara Other Clinician: Referring Provider: Treating Provider/Extender: Myriam Jacobson in Treatment: [address] ([id number]) [id number] Page 8 of 9 Eyes Medical History: Negative for: Cataracts; Glaucoma; Optic Neuritis Ear/Nose/Mouth/Throat Medical History: Negative for: Chronic sinus problems/congestion; Middle ear problems Respiratory Medical History: Negative for: Aspiration; Asthma;  Chronic Obstructive Pulmonary Disease (COPD); Pneumothorax; Sleep Apnea; Tuberculosis Cardiovascular Medical History: Positive for: Congestive Heart Failure; Hypertension; Peripheral Venous Disease Past Medical History Notes: cardiomyopathy hyperlipidemia hypokalemia stroke Endocrine Medical History: Positive for: Type II Diabetes Time with diabetes: 20 years Treated with: Insulin, Oral agents Genitourinary Medical History: Past Medical History Notes: orchitis Musculoskeletal Medical History: Positive for: Osteoarthritis Neurologic Medical History: Positive for: Neuropathy Oncologic Medical History: Negative for: Received  Chemotherapy; Received Radiation Psychiatric Medical History: Negative for: Anorexia/bulimia; Confinement Anxiety Immunizations Pneumococcal Vaccine: Received Pneumococcal Vaccination: Yes Received Pneumococcal Vaccination On or After 60th Birthday: Yes Implantable Devices No devices added Family and Social History Cancer: Yes - Father,Mother,Siblings; Diabetes: Yes - Mother,Siblings; Heart Disease: Yes; Hereditary Spherocytosis: No; Hypertension: No; Kidney Disease: No; Lung Disease: Yes - Father; Seizures: No; Stroke: No; Thyroid Problems: No; Tuberculosis: No; Former smoker - ended on 12/23/1970; Marital Status - Married; Alcohol Use: Never; Drug Use: No History; Caffeine Use: Daily; Financial Concerns: No; Food, Clothing or Shelter Needs: No; Support System Lacking: No; Transportation Concerns: No Electronic Signature(s) Signed: 02/06/2023 3:55:54 PM By: Duanne Guess MD FACS Entered By: Duanne Guess on 02/06/2023 15:32:12 [place] ([id number]) [id number] Page 9 of 9 -------------------------------------------------------------------------------- SuperBill Details Patient Name: Date of Service: [place] [name], [name] 02/06/2023 Medical Record Number: [id number] Patient Account Number: [id number] Date of Birth/Sex: Treating RN: [birth year] (67 y.o. M) Primary Care Provider: Babs Sciara Other Clinician: Referring Provider: Treating Provider/Extender: Myriam Jacobson in Treatment: 13 Diagnosis Coding ICD-10 Codes Code Description L97.819 Non-pressure chronic ulcer of other part of right lower leg with unspecified severity E11.622 Type 2 diabetes mellitus with other skin ulcer E11.621 Type 2 diabetes mellitus with foot ulcer I50.22 Chronic systolic (congestive) heart failure I73.9 Peripheral vascular disease, unspecified E11.42 Type 2 diabetes mellitus with diabetic polyneuropathy I63.9 Cerebral infarction,  unspecified I10 Essential (primary) hypertension G89.4 Chronic pain syndrome L84 Corns and callosities Facility Procedures : CPT4 Code: [id number] Description: 11042 - DEB SUBQ TISSUE 20 SQ CM/< ICD-10 Diagnosis Description L97.819 Non-pressure chronic ulcer of other part of right lower leg with unspecified se Modifier: verity Quantity: 1 Physician Procedures : CPT4 Code Description Modifier [id number] - WC PHYS LEVEL 3 - EST PT 25 ICD-10 Diagnosis Description L97.819 Non-pressure chronic ulcer of other part of right lower leg with unspecified severity E11.622 Type 2 diabetes mellitus with other skin ulcer  I73.9 Peripheral vascular disease, unspecified I50.22 Chronic systolic (congestive) heart failure Quantity: 1 : [id number] 11042 - WC PHYS SUBQ TISS 20 SQ CM ICD-10 Diagnosis Description L97.819 Non-pressure chronic ulcer of other part of right lower leg with unspecified severity Quantity: 1 Electronic Signature(s) Signed: 02/06/2023 3:37:14 PM By: Duanne Guess MD FACS Entered By: Duanne Guess on 02/06/2023 15:37:14

## 2023-02-08 ENCOUNTER — Encounter: Payer: Self-pay | Admitting: Neurology

## 2023-02-09 ENCOUNTER — Other Ambulatory Visit: Payer: Medicare HMO

## 2023-02-14 ENCOUNTER — Encounter (HOSPITAL_BASED_OUTPATIENT_CLINIC_OR_DEPARTMENT_OTHER): Payer: Medicare HMO | Admitting: General Surgery

## 2023-02-14 DIAGNOSIS — E11621 Type 2 diabetes mellitus with foot ulcer: Secondary | ICD-10-CM | POA: Diagnosis not present

## 2023-02-14 DIAGNOSIS — G894 Chronic pain syndrome: Secondary | ICD-10-CM | POA: Diagnosis not present

## 2023-02-14 DIAGNOSIS — L97512 Non-pressure chronic ulcer of other part of right foot with fat layer exposed: Secondary | ICD-10-CM | POA: Diagnosis not present

## 2023-02-14 DIAGNOSIS — I110 Hypertensive heart disease with heart failure: Secondary | ICD-10-CM | POA: Diagnosis not present

## 2023-02-14 DIAGNOSIS — E1142 Type 2 diabetes mellitus with diabetic polyneuropathy: Secondary | ICD-10-CM | POA: Diagnosis not present

## 2023-02-14 DIAGNOSIS — I2510 Atherosclerotic heart disease of native coronary artery without angina pectoris: Secondary | ICD-10-CM | POA: Diagnosis not present

## 2023-02-14 DIAGNOSIS — S81801A Unspecified open wound, right lower leg, initial encounter: Secondary | ICD-10-CM | POA: Diagnosis not present

## 2023-02-14 DIAGNOSIS — L97312 Non-pressure chronic ulcer of right ankle with fat layer exposed: Secondary | ICD-10-CM | POA: Diagnosis not present

## 2023-02-14 DIAGNOSIS — E1151 Type 2 diabetes mellitus with diabetic peripheral angiopathy without gangrene: Secondary | ICD-10-CM | POA: Diagnosis not present

## 2023-02-14 DIAGNOSIS — L97919 Non-pressure chronic ulcer of unspecified part of right lower leg with unspecified severity: Secondary | ICD-10-CM | POA: Diagnosis not present

## 2023-02-14 DIAGNOSIS — E11622 Type 2 diabetes mellitus with other skin ulcer: Secondary | ICD-10-CM | POA: Diagnosis not present

## 2023-02-14 DIAGNOSIS — I429 Cardiomyopathy, unspecified: Secondary | ICD-10-CM | POA: Diagnosis not present

## 2023-02-14 DIAGNOSIS — L84 Corns and callosities: Secondary | ICD-10-CM | POA: Diagnosis not present

## 2023-02-14 DIAGNOSIS — Z833 Family history of diabetes mellitus: Secondary | ICD-10-CM | POA: Diagnosis not present

## 2023-02-14 DIAGNOSIS — I5022 Chronic systolic (congestive) heart failure: Secondary | ICD-10-CM | POA: Diagnosis not present

## 2023-02-15 NOTE — Progress Notes (Signed)
[name], [name] ([id number]) [id number] Page 1 of 10 Visit Report for 02/14/2023 Chief Complaint Document Details Patient Name: Date of Service: [name] 02/14/2023 9:45 A M Medical Record Number: [id number] Patient Account Number: [id number] Date of Birth/Sex: Treating RN: 1956-01-04 (67 y.o. M) Primary Care Provider: Babs Sciara Other Clinician: Referring Provider: Treating Provider/Extender: Myriam Jacobson in Treatment: 14 Information Obtained from: Patient Chief Complaint Patients presents for treatment of an open diabetic ulcer on his right foot, as well as ulcers on his right lower leg and ankle Electronic Signature(s) Signed: 02/14/2023 11:49:53 AM By: Duanne Guess MD FACS Entered By: Duanne Guess on 02/14/2023 11:49:53 -------------------------------------------------------------------------------- Debridement Details Patient Name: Date of Service: [name], [name] Niemann D. 02/14/2023 9:45 A M Medical Record Number: [id number] Patient Account Number: [id number] Date of Birth/Sex: Treating RN: 1956/09/13 (67 y.o. [name] Primary Care Provider: Babs Sciara Other Clinician: Referring Provider: Treating Provider/Extender: Myriam Jacobson in Treatment: 14 Debridement Performed for Assessment: Wound #1 Right,Lateral Lower Leg Performed By: Physician Duanne Guess, MD Debridement Type: Debridement Level of Consciousness (Pre-procedure): Awake and Alert Pre-procedure Verification/Time Out Yes - 10:20 Taken: Start Time: 10:25 Pain Control: Lidocaine 5% topical ointment Percent of Wound Bed Debrided: 100% T Area Debrided (cm): otal 0.71 Tissue and other material debrided: Non-Viable, Slough, Subcutaneous, Slough Level: Skin/Subcutaneous Tissue Debridement Description: Excisional Instrument: Curette Bleeding: Minimum Hemostasis Achieved: Pressure Procedural Pain: 0 Post  Procedural Pain: 0 Response to Treatment: Procedure was tolerated well Level of Consciousness (Post- Awake and Alert procedure): Post Debridement Measurements of Total Wound Length: (cm) 3 Width: (cm) 0.3 Depth: (cm) 0.1 Volume: (cm) 0.071 Character of Wound/Ulcer Post Debridement: Improved Post Procedure Diagnosis Same as Pre-procedure Notes Scribed for Dr Lady Gary by Redmond Pulling, RN Electronic Signature(s) Signed: 02/14/2023 3:48:02 PM By: Redmond Pulling RN, BSN Signed: 02/14/2023 3:54:11 PM By: Duanne Guess MD FACS [name] ([id number]) [phone].pdf Page 2 of 10 Entered By: Redmond Pulling on 02/14/2023 10:27:04 -------------------------------------------------------------------------------- HPI Details Patient Name: Date of Service: [name], [name] 02/14/2023 9:45 A M Medical Record Number: [id number] Patient Account Number: [id number] Date of Birth/Sex: Treating RN: October 07, 1956 (67 y.o. M) Primary Care Provider: Babs Sciara Other Clinician: Referring Provider: Treating Provider/Extender: Myriam Jacobson in Treatment: 14 History of Present Illness HPI Description: ADMISSION 11/07/2022 This is a 67 year old man with a past medical history significant for type 2 diabetes (last hemoglobin A1c 7.1), cerebrovascular occlusive disease, coronary artery disease, peripheral vascular disease, congestive heart failure, chronic pain syndrome, and prior amputation of his right great and second toes. He has a remote history of surgery on his right ankle and apparently as his legs have become increasingly more swollen and he has insisted upon wearing boots, the pressure and friction seem to have opened up wounds along the old scar line. In addition, he has a large callus on the third metatarsal head, related to his acquired foot deformity. He sees a podiatrist who periodically shaves this callus down and he was also recommended  orthotics, but the patient has refused them because he likes his boots. There is an area of discoloration in the lower portion of this callus, suspicious for an open wound. 11/14/2022: The ulcer on his third metatarsal head has closed. The lateral leg ulcers are both about the same size, but he says that they feel better. There is slough accumulation in both sites. 11/23/2022: The ulcer on his  foot remains closed. The lateral leg ulcers are still unchanged in size, but are a bit cleaner, just with some slough accumulation. 12/01/2022: No significant change to the size of the wounds on his lateral leg. The larger is quite clean with good granulation tissue present. The smaller still has fairly thick slough buildup. 12/09/2022: Both wounds are smaller today. They still have slough accumulation, but less so than at his previous visit. The callus on his foot has built up again and is uncomfortable. 12/16/2022: The wound at his ankle is nearly closed with just a little slough on the remaining open surface. The lateral leg wound is about the same size but has less slough accumulation. We are still working on Therapist, occupational for wound VAC. 12/23/2022: The wound is ankle is down to just a pinhole with a small amount of slough and eschar present. The lateral leg wound has contracted in length and is also shallower. There is slough on the surface. We still have not secured insurance approval for his wound VAC. 01/05/2023: The ankle wound is healed underneath a layer of eschar. The lateral leg wound is smaller and has some slough accumulation. He was finally approved for a wound VAC and has it with him today. 01/13/2023: The wound depth has come in considerably and the overall wound size has contracted as well. There is just a little slough on the surface. He is having an excellent response to negative pressure wound therapy. 01/19/2023: The wound is smaller and shallower again today. There is some slough accumulation  on the surface. 01/27/2023: The wound is smaller again today and is essentially flush with the surrounding skin. The wound on his second metatarsal head has reopened under a thick layer of callus. 02/06/2023: The foot wound has healed. The lateral leg wound is smaller today with some slough accumulation. 02/14/2023: The lateral leg wound is considerably narrower today. There is a little bit of slough buildup. He says the callus on his second metatarsal head has built up to the point that it is bothering him again. Electronic Signature(s) Signed: 02/14/2023 11:50:27 AM By: Duanne Guess MD FACS Entered By: Duanne Guess on 02/14/2023 11:50:27 -------------------------------------------------------------------------------- Paring/cutting 1 benign hyperkeratotic lesion Details Patient Name: Date of Service: [name] 02/14/2023 9:45 A M Medical Record Number: [id number] Patient Account Number: [id number] Date of Birth/Sex: Treating RN: 1956-05-10 (67 y.o. [name] Primary Care Provider: Babs Sciara Other Clinician: Referring Provider: Treating Provider/Extender: Myriam Jacobson in Treatment: 14 Procedure Performed for: Non-Wound Location Performed By: Physician Duanne Guess, MD Post Procedure Diagnosis Same as Pre-procedure [name] ([id number]) [id number] Page 3 of 10 Notes Paring of callus to 2nd met head, pt tolerated well scribed for Dr. Lady Gary by Redmond Pulling, RN Electronic Signature(s) Signed: 02/14/2023 3:48:02 PM By: Redmond Pulling RN, BSN Signed: 02/14/2023 3:54:11 PM By: Duanne Guess MD FACS Entered By: Redmond Pulling on 02/14/2023 10:29:44 -------------------------------------------------------------------------------- Physical Exam Details Patient Name: Date of Service: [name], [name] Niemann D. 02/14/2023 9:45 A M Medical Record Number: [id number] Patient Account Number: [id number] Date of  Birth/Sex: Treating RN: 03/04/1956 (67 y.o. M) Primary Care Provider: Babs Sciara Other Clinician: Referring Provider: Treating Provider/Extender: Marchelle Folks, Scott A Weeks in Treatment: 14 Constitutional . . . . no acute distress. Respiratory Normal work of breathing on room air. Notes 02/14/2023: The lateral leg wound is considerably narrower today. There is a little bit of slough buildup. He says the callus on  his second metatarsal head has built up to the point that it is bothering him again. Electronic Signature(s) Signed: 02/14/2023 11:50:54 AM By: Duanne Guess MD FACS Entered By: Duanne Guess on 02/14/2023 11:50:54 -------------------------------------------------------------------------------- Physician Orders Details Patient Name: Date of Service: [name], [name] Niemann D. 02/14/2023 9:45 A M Medical Record Number: [id number] Patient Account Number: [id number] Date of Birth/Sex: Treating RN: May 18, 1956 (67 y.o. [name] Primary Care Provider: Babs Sciara Other Clinician: Referring Provider: Treating Provider/Extender: Myriam Jacobson in Treatment: 43 Verbal / Phone Orders: No Diagnosis Coding ICD-10 Coding Code Description L97.819 Non-pressure chronic ulcer of other part of right lower leg with unspecified severity E11.622 Type 2 diabetes mellitus with other skin ulcer E11.621 Type 2 diabetes mellitus with foot ulcer I50.22 Chronic systolic (congestive) heart failure I73.9 Peripheral vascular disease, unspecified E11.42 Type 2 diabetes mellitus with diabetic polyneuropathy I63.9 Cerebral infarction, unspecified I10 Essential (primary) hypertension G89.4 Chronic pain syndrome L84 Corns and callosities Follow-up Appointments ppointment in 1 week. - Dr. Lady Gary Room 3 Return A Tuesday 5/7 @ 09:45 am Anesthetic Wound #1 Right,Lateral Lower Leg (In clinic) Topical Lidocaine 4% applied to wound bed [name], [name]  ([id number]) [phone].pdf Page 4 of 10 Bathing/ Shower/ Hygiene May shower with protection but do not get wound dressing(s) wet. Protect dressing(s) with water repellant cover (for example, large plastic bag) [name] a cast cover and may then take shower. - May purchase cast protector at [facility], [facility], [name] [facility] Edema Control - Lymphedema / SCD / Other Right Lower Extremity Elevate legs to the level of the heart [name] above for 30 minutes daily and/[name] when sitting for 3-4 times a day throughout the day. Avoid standing for long periods of time. Exercise regularly Off-Loading Other: - Wear prescribed foot wear (Orthotics). Do not wear the boots. Wound Treatment Wound #1 - Lower Leg Wound Laterality: Right, Lateral Cleanser: Soap and Water 1 x Per Week/30 Days Discharge Instructions: May shower and wash wound with dial antibacterial soap and water prior to dressing change. Cleanser: Wound Cleanser 1 x Per Week/30 Days Discharge Instructions: Cleanse the wound with wound cleanser prior to applying a clean dressing using gauze sponges, not tissue [name] cotton balls. Peri-Wound Care: Zinc Oxide Ointment 30g tube 1 x Per Week/30 Days Discharge Instructions: Apply Zinc Oxide to periwound with each dressing change Peri-Wound Care: Sween Lotion (Moisturizing lotion) 1 x Per Week/30 Days Discharge Instructions: Apply moisturizing lotion as directed Prim Dressing: Maxorb Extra Ag+ Alginate Dressing, 2x2 (in/in) 1 x Per Week/30 Days ary Discharge Instructions: Apply to wound bed as instructed Secondary Dressing: ABD Pad, 5x9 1 x Per Week/30 Days Discharge Instructions: Apply over primary dressing as directed. Secondary Dressing: Woven Gauze Sponge, Non-Sterile 4x4 in 1 x Per Week/30 Days Discharge Instructions: Apply over primary dressing as directed. Compression Wrap: ThreePress (3 layer compression wrap) 1 x Per Week/30 Days Discharge Instructions: Apply three layer compression as  directed. Patient Medications llergies: metformin, Statins-HMG-CoA Reductase Inhibitors, Vicodin A Notifications Medication Indication Start End 02/14/2023 lidocaine DOSE topical 5 % ointment - ointment topical once daily Electronic Signature(s) Signed: 02/14/2023 3:54:11 PM By: Duanne Guess MD FACS Entered By: Duanne Guess on 02/14/2023 11:51:29 -------------------------------------------------------------------------------- Problem List Details Patient Name: Date of Service: [name], [name] Niemann D. 02/14/2023 9:45 A M Medical Record Number: [id number] Patient Account Number: [id number] Date of Birth/Sex: Treating RN: May 10, 1956 (67 y.o. M) Primary Care Provider: Babs Sciara Other Clinician: Referring Provider: Treating Provider/Extender: Creta Levin  A Weeks in Treatment: 14 Active Problems ICD-10 Encounter Code Description Active Date MDM Diagnosis L97.819 Non-pressure chronic ulcer of other part of right lower leg with unspecified 11/07/2022 No Yes severity [name], [name] ([id number]) [phone].pdf Page 5 of 10 E11.622 Type 2 diabetes mellitus with other skin ulcer 11/07/2022 No Yes E11.621 Type 2 diabetes mellitus with foot ulcer 11/07/2022 No Yes I50.22 Chronic systolic (congestive) heart failure 11/07/2022 No Yes I73.9 Peripheral vascular disease, unspecified 11/07/2022 No Yes E11.42 Type 2 diabetes mellitus with diabetic polyneuropathy 11/07/2022 No Yes I63.9 Cerebral infarction, unspecified 11/07/2022 No Yes I10 Essential (primary) hypertension 11/07/2022 No Yes G89.4 Chronic pain syndrome 11/07/2022 No Yes L84 Corns and callosities 12/09/2022 No Yes Inactive Problems ICD-10 Code Description Active Date Inactive Date L97.512 Non-pressure chronic ulcer of other part of right foot with fat layer exposed 11/07/2022 11/07/2022 Resolved Problems ICD-10 Code Description Active Date Resolved Date L97.312 Non-pressure chronic ulcer of  right ankle with fat layer exposed 11/07/2022 11/07/2022 Electronic Signature(s) Signed: 02/14/2023 11:47:29 AM By: Duanne Guess MD FACS Entered By: Duanne Guess on 02/14/2023 11:47:29 -------------------------------------------------------------------------------- Progress Note Details Patient Name: Date of Service: [name], [name] Niemann D. 02/14/2023 9:45 A M Medical Record Number: [id number] Patient Account Number: [id number] Date of Birth/Sex: Treating RN: 12/25/1955 (67 y.o. M) Primary Care Provider: Babs Sciara Other Clinician: Referring Provider: Treating Provider/Extender: Myriam Jacobson in Treatment: 14 Subjective Chief Complaint Information obtained from Patient Patients presents for treatment of an open diabetic ulcer on his right foot, as well as ulcers on his right lower leg and ankle [name], [name] ([id number]) [phone].pdf Page 6 of 10 History of Present Illness (HPI) ADMISSION 11/07/2022 This is a 67 year old man with a past medical history significant for type 2 diabetes (last hemoglobin A1c 7.1), cerebrovascular occlusive disease, coronary artery disease, peripheral vascular disease, congestive heart failure, chronic pain syndrome, and prior amputation of his right great and second toes. He has a remote history of surgery on his right ankle and apparently as his legs have become increasingly more swollen and he has insisted upon wearing boots, the pressure and friction seem to have opened up wounds along the old scar line. In addition, he has a large callus on the third metatarsal head, related to his acquired foot deformity. He sees a podiatrist who periodically shaves this callus down and he was also recommended orthotics, but the patient has refused them because he likes his boots. There is an area of discoloration in the lower portion of this callus, suspicious for an open wound. 11/14/2022: The ulcer on his third  metatarsal head has closed. The lateral leg ulcers are both about the same size, but he says that they feel better. There is slough accumulation in both sites. 11/23/2022: The ulcer on his foot remains closed. The lateral leg ulcers are still unchanged in size, but are a bit cleaner, just with some slough accumulation. 12/01/2022: No significant change to the size of the wounds on his lateral leg. The larger is quite clean with good granulation tissue present. The smaller still has fairly thick slough buildup. 12/09/2022: Both wounds are smaller today. They still have slough accumulation, but less so than at his previous visit. The callus on his foot has built up again and is uncomfortable. 12/16/2022: The wound at his ankle is nearly closed with just a little slough on the remaining open surface. The lateral leg wound is about the same size but has less slough accumulation. We are still  working on Therapist, occupational for wound VAC. 12/23/2022: The wound is ankle is down to just a pinhole with a small amount of slough and eschar present. The lateral leg wound has contracted in length and is also shallower. There is slough on the surface. We still have not secured insurance approval for his wound VAC. 01/05/2023: The ankle wound is healed underneath a layer of eschar. The lateral leg wound is smaller and has some slough accumulation. He was finally approved for a wound VAC and has it with him today. 01/13/2023: The wound depth has come in considerably and the overall wound size has contracted as well. There is just a little slough on the surface. He is having an excellent response to negative pressure wound therapy. 01/19/2023: The wound is smaller and shallower again today. There is some slough accumulation on the surface. 01/27/2023: The wound is smaller again today and is essentially flush with the surrounding skin. The wound on his second metatarsal head has reopened under a thick layer of callus. 02/06/2023:  The foot wound has healed. The lateral leg wound is smaller today with some slough accumulation. 02/14/2023: The lateral leg wound is considerably narrower today. There is a little bit of slough buildup. He says the callus on his second metatarsal head has built up to the point that it is bothering him again. Patient History Family History Cancer - Father,Mother,Siblings, Diabetes - Mother,Siblings, Heart Disease, Lung Disease - Father, No family history of Hereditary Spherocytosis, Hypertension, Kidney Disease, Seizures, Stroke, Thyroid Problems, Tuberculosis. Social History Former smoker - ended on 12/23/1970, Marital Status - Married, Alcohol Use - Never, Drug Use - No History, Caffeine Use - Daily. Medical History Eyes Denies history of Cataracts, Glaucoma, Optic Neuritis Ear/Nose/Mouth/Throat Denies history of Chronic sinus problems/congestion, Middle ear problems Respiratory Denies history of Aspiration, Asthma, Chronic Obstructive Pulmonary Disease (COPD), Pneumothorax, Sleep Apnea, Tuberculosis Cardiovascular Patient has history of Congestive Heart Failure, Hypertension, Peripheral Venous Disease Endocrine Patient has history of Type II Diabetes Musculoskeletal Patient has history of Osteoarthritis Neurologic Patient has history of Neuropathy Oncologic Denies history of Received Chemotherapy, Received Radiation Psychiatric Denies history of Anorexia/bulimia, Confinement Anxiety Medical A Surgical History Notes nd Cardiovascular cardiomyopathy hyperlipidemia hypokalemia stroke Genitourinary orchitis Objective [name], [name] ([id number]) [id number] Page 7 of 10 Constitutional no acute distress. Vitals Time Taken: 9:52 AM, Height: 74 in, Weight: 252 lbs, BMI: 32.4, Temperature: 98.4 F, Pulse: 78 bpm, Respiratory Rate: 20 breaths/min, Blood Pressure: 133/70 mmHg. Respiratory Normal work of breathing on room air. General Notes: 02/14/2023: The  lateral leg wound is considerably narrower today. There is a little bit of slough buildup. He says the callus on his second metatarsal head has built up to the point that it is bothering him again. Integumentary (Hair, Skin) Wound #1 status is Open. Original cause of wound was Shear/Friction. The date acquired was: 09/16/2022. The wound has been in treatment 14 weeks. The wound is located on the Right,Lateral Lower Leg. The wound measures 3.8cm length x 0.8cm width x 0.2cm depth; 2.388cm^2 area and 0.478cm^3 volume. There is Fat Layer (Subcutaneous Tissue) exposed. There is no tunneling [name] undermining noted. There is a medium amount of serosanguineous drainage noted. The wound margin is distinct with the outline attached to the wound base. There is large (67-100%) pink granulation within the wound bed. There is a small (1- 33%) amount of necrotic tissue within the wound bed including Adherent Slough. The periwound skin appearance had no abnormalities noted for moisture. The periwound skin  appearance exhibited: Scarring, Hemosiderin Staining. Periwound temperature was noted as No Abnormality. Assessment Active Problems ICD-10 Non-pressure chronic ulcer of other part of right lower leg with unspecified severity Type 2 diabetes mellitus with other skin ulcer Type 2 diabetes mellitus with foot ulcer Chronic systolic (congestive) heart failure Peripheral vascular disease, unspecified Type 2 diabetes mellitus with diabetic polyneuropathy Cerebral infarction, unspecified Essential (primary) hypertension Chronic pain syndrome Corns and callosities Procedures Wound #1 Pre-procedure diagnosis of Wound #1 is a Trauma, Other located on the Right,Lateral Lower Leg . There was a Excisional Skin/Subcutaneous Tissue Debridement with a total area of 0.71 sq cm performed by Duanne Guess, MD. With the following instrument(s): Curette to remove Non-Viable tissue/material. Material removed includes  Subcutaneous Tissue and Slough and after achieving pain control using Lidocaine 5% topical ointment. No specimens were taken. A time out was conducted at 10:20, prior to the start of the procedure. A Minimum amount of bleeding was controlled with Pressure. The procedure was tolerated well with a pain level of 0 throughout and a pain level of 0 following the procedure. Post Debridement Measurements: 3cm length x 0.3cm width x 0.1cm depth; 0.071cm^3 volume. Character of Wound/Ulcer Post Debridement is improved. Post procedure Diagnosis Wound #1: Same as Pre-Procedure General Notes: Scribed for Dr Lady Gary by Redmond Pulling, RN. Pre-procedure diagnosis of Wound #1 is a Trauma, Other located on the Right,Lateral Lower Leg . There was a Three Layer Compression Therapy Procedure by Redmond Pulling, RN. Post procedure Diagnosis Wound #1: Same as Pre-Procedure A Paring/cutting 1 benign hyperkeratotic lesion procedure was performed. by Duanne Guess, MD. Post procedure Diagnosis Wound #: Same as Pre-Procedure Notes: Paring of callus to 2nd met head, pt tolerated well scribed for Dr. Lady Gary by Redmond Pulling, RN Plan Follow-up Appointments: Return Appointment in 1 week. - Dr. Lady Gary Room 3 Tuesday 5/7 @ 09:45 am Anesthetic: Wound #1 Right,Lateral Lower Leg: (In clinic) Topical Lidocaine 4% applied to wound bed Bathing/ Shower/ Hygiene: May shower with protection but do not get wound dressing(s) wet. Protect dressing(s) with water repellant cover (for example, large plastic bag) [name] a cast cover and may then take shower. - May purchase cast protector at [facility], [facility], [name] [facility] Edema Control - Lymphedema / SCD / Other: Elevate legs to the level of the heart [name] above for 30 minutes daily and/[name] when sitting for 3-4 times a day throughout the day. Avoid standing for long periods of time. Exercise regularly Off-Loading: [name], [name] ([id number]) [id number] Page 8 of  10 Other: - Wear prescribed foot wear (Orthotics). Do not wear the boots. The following medication(s) was prescribed: lidocaine topical 5 % ointment ointment topical once daily was prescribed at facility WOUND #1: - Lower Leg Wound Laterality: Right, Lateral Cleanser: Soap and Water 1 x Per Week/30 Days Discharge Instructions: May shower and wash wound with dial antibacterial soap and water prior to dressing change. Cleanser: Wound Cleanser 1 x Per Week/30 Days Discharge Instructions: Cleanse the wound with wound cleanser prior to applying a clean dressing using gauze sponges, not tissue [name] cotton balls. Peri-Wound Care: Zinc Oxide Ointment 30g tube 1 x Per Week/30 Days Discharge Instructions: Apply Zinc Oxide to periwound with each dressing change Peri-Wound Care: Sween Lotion (Moisturizing lotion) 1 x Per Week/30 Days Discharge Instructions: Apply moisturizing lotion as directed Prim Dressing: Maxorb Extra Ag+ Alginate Dressing, 2x2 (in/in) 1 x Per Week/30 Days ary Discharge Instructions: Apply to wound bed as instructed Secondary Dressing: ABD Pad, 5x9 1 x Per Week/30 Days Discharge  Instructions: Apply over primary dressing as directed. Secondary Dressing: Woven Gauze Sponge, Non-Sterile 4x4 in 1 x Per Week/30 Days Discharge Instructions: Apply over primary dressing as directed. Com pression Wrap: ThreePress (3 layer compression wrap) 1 x Per Week/30 Days Discharge Instructions: Apply three layer compression as directed. 02/14/2023: The lateral leg wound is considerably narrower today. There is a little bit of slough buildup. He says the callus on his second metatarsal head has built up to the point that it is bothering him again. I used a curette to debride slough and subcutaneous tissue from the wound on his leg. I also pared back the callus on his foot to the point of comfort. We will continue silver alginate and 3 layer compression. He will follow-up in 1 week. He may very well be  completely healed at that point. Electronic Signature(s) Signed: 02/14/2023 11:52:18 AM By: Duanne Guess MD FACS Entered By: Duanne Guess on 02/14/2023 11:52:18 -------------------------------------------------------------------------------- HxROS Details Patient Name: Date of Service: [name], [name] Niemann D. 02/14/2023 9:45 A M Medical Record Number: [id number] Patient Account Number: [id number] Date of Birth/Sex: Treating RN: July 11, 1956 (67 y.o. M) Primary Care Provider: Babs Sciara Other Clinician: Referring Provider: Treating Provider/Extender: Myriam Jacobson in Treatment: 14 Eyes Medical History: Negative for: Cataracts; Glaucoma; Optic Neuritis Ear/Nose/Mouth/Throat Medical History: Negative for: Chronic sinus problems/congestion; Middle ear problems Respiratory Medical History: Negative for: Aspiration; Asthma; Chronic Obstructive Pulmonary Disease (COPD); Pneumothorax; Sleep Apnea; Tuberculosis Cardiovascular Medical History: Positive for: Congestive Heart Failure; Hypertension; Peripheral Venous Disease Past Medical History Notes: cardiomyopathy hyperlipidemia hypokalemia stroke Endocrine Medical History: Positive for: Type II Diabetes Time with diabetes: 20 years Treated with: Insulin, Oral agents [name], [name] ([id number]) [phone].pdf Page 9 of 10 Genitourinary Medical History: Past Medical History Notes: orchitis Musculoskeletal Medical History: Positive for: Osteoarthritis Neurologic Medical History: Positive for: Neuropathy Oncologic Medical History: Negative for: Received Chemotherapy; Received Radiation Psychiatric Medical History: Negative for: Anorexia/bulimia; Confinement Anxiety Immunizations Pneumococcal Vaccine: Received Pneumococcal Vaccination: Yes Received Pneumococcal Vaccination On [name] After 60th Birthday: Yes Implantable Devices No devices added Family and Social  History Cancer: Yes - Father,Mother,Siblings; Diabetes: Yes - Mother,Siblings; Heart Disease: Yes; Hereditary Spherocytosis: No; Hypertension: No; Kidney Disease: No; Lung Disease: Yes - Father; Seizures: No; Stroke: No; Thyroid Problems: No; Tuberculosis: No; Former smoker - ended on 12/23/1970; Marital Status - Married; Alcohol Use: Never; Drug Use: No History; Caffeine Use: Daily; Financial Concerns: No; Food, Clothing [name] Shelter Needs: No; Support System Lacking: No; Transportation Concerns: No Psychologist, prison and probation services) Signed: 02/14/2023 3:54:11 PM By: Duanne Guess MD FACS Entered By: Duanne Guess on 02/14/2023 11:50:34 -------------------------------------------------------------------------------- SuperBill Details Patient Name: Date of Service: [name], [name] Niemann D. 02/14/2023 Medical Record Number: [id number] Patient Account Number: [id number] Date of Birth/Sex: Treating RN: 11-19-1955 (67 y.o. M) Primary Care Provider: Babs Sciara Other Clinician: Referring Provider: Treating Provider/Extender: Myriam Jacobson in Treatment: 14 Diagnosis Coding ICD-10 Codes Code Description L97.819 Non-pressure chronic ulcer of other part of right lower leg with unspecified severity E11.622 Type 2 diabetes mellitus with other skin ulcer E11.621 Type 2 diabetes mellitus with foot ulcer I50.22 Chronic systolic (congestive) heart failure I73.9 Peripheral vascular disease, unspecified E11.42 Type 2 diabetes mellitus with diabetic polyneuropathy I63.9 Cerebral infarction, unspecified I10 Essential (primary) hypertension G89.4 Chronic pain syndrome [address] and callosities [name], [name] ([id number]) [id number] Page 10 of 10 Facility Procedures : CPT4 Code: 56387564 Description: 11042 - DEB SUBQ TISSUE 20 SQ CM/< ICD-10 Diagnosis Description  L97.819 Non-pressure chronic ulcer of other part of right lower leg with unspecified se Modifier:  verity Quantity: 1 : CPT4 Code: 16109604 Description: 11055 - PARE BENIGN LES; SGL ICD-10 Diagnosis Description L84 Corns and callosities Modifier: Quantity: 1 Physician Procedures : CPT4 Code Description Modifier [id number] 99214 - WC PHYS LEVEL 4 - EST PT 25 ICD-10 Diagnosis Description L97.819 Non-pressure chronic ulcer of other part of right lower leg with unspecified severity E11.622 Type 2 diabetes mellitus with other skin ulcer  E11.621 Type 2 diabetes mellitus with foot ulcer L84 Corns and callosities Quantity: 1 : [id number] 11042 - WC PHYS SUBQ TISS 20 SQ CM ICD-10 Diagnosis Description L97.819 Non-pressure chronic ulcer of other part of right lower leg with unspecified severity Quantity: 1 : [id number] 11055 - WC PHYS PARE BENIGN LES; SGL ICD-10 Diagnosis Description L84 Corns and callosities Quantity: 1 Electronic Signature(s) Signed: 02/14/2023 11:53:02 AM By: Duanne Guess MD FACS Entered By: Duanne Guess on 02/14/2023 11:53:02

## 2023-02-15 NOTE — Progress Notes (Signed)
[name], [name] ([id number]) [id number] Page 1 of 7 Visit Report for 02/14/2023 Arrival Information Details Patient Name: Date of Service: [name], [name] 02/14/2023 9:45 A M Medical Record Number: [id number] Patient Account Number: [id number] Date of Birth/Sex: Treating RN: 1956/08/04 (67 y.o. M) Primary Care [name]: [name] Other Clinician: Referring [name]: Treating [name]/Extender: [name] in Treatment: 14 Visit Information History Since Last Visit All ordered tests and consults were completed: No Patient Arrived: Ambulatory Added or deleted any medications: No Arrival Time: 09:52 Any new allergies or adverse reactions: No Accompanied By: wife Had a fall or experienced change in No Transfer Assistance: None activities of daily living that may affect Patient Identification Verified: Yes risk of falls: Secondary Verification Process Completed: Yes Signs or symptoms of abuse/neglect since last visito No Patient Requires Transmission-Based Precautions: No Hospitalized since last visit: No Patient Has Alerts: Yes Implantable device outside of the clinic excluding No Patient Alerts: ABI RLE [place] cellular tissue based products placed in the center since last visit: Pain Present Now: No Electronic Signature(s) Signed: 02/14/2023 11:32:44 AM By: [name] Entered By: [name] on 02/14/2023 09:52:47 -------------------------------------------------------------------------------- Compression Therapy Details Patient Name: Date of Service: [name] 02/14/2023 9:45 A M Medical Record Number: [id number] Patient Account Number: [id number] Date of Birth/Sex: Treating RN: [birth year] (67 y.o. [name] Primary Care [name]: [name] Other Clinician: Referring Claudia Alvizo: Treating [name]/Extender: [name] in Treatment: 14 Compression Therapy Performed for  Wound Assessment: Wound #1 Right,Lateral Lower Leg Performed By: Clinician [name] Pulling, RN Compression Type: Three Layer Post Procedure Diagnosis Same as Pre-procedure Electronic Signature(s) Signed: 02/14/2023 3:48:02 PM By: [name] Entered By: [name] on 02/14/2023 11:37:56 -------------------------------------------------------------------------------- Encounter Discharge Information Details Patient Name: Date of Service: [name], [name] Niemann D. 02/14/2023 9:45 A M Medical Record Number: [id number] Patient Account Number: [id number] Date of Birth/Sex: Treating RN: Apr 29, 1956 (67 y.o. [name] Primary Care [name]: [name] Other Clinician: Referring [name]: Treating [name]/Extender: [name] in Treatment: 14 Encounter Discharge Information Items Post Procedure Vitals Discharge Condition: Stable Temperature (F): 98.4 Ambulatory Status: Ambulatory Pulse (bpm): 78 Discharge Destination: Home Respiratory Rate (breaths/min): 18 Transportation: Private Auto Blood Pressure (mmHg): 133/70 Accompanied By: wife [name], [name] ([id number]) [id number] Page 2 of 7 Schedule Follow-up Appointment: Yes Clinical Summary of Care: Patient Declined Electronic Signature(s) Signed: 02/14/2023 3:48:02 PM By: [name] Entered By: [name] on 02/14/2023 11:41:02 -------------------------------------------------------------------------------- Lower Extremity Assessment Details Patient Name: Date of Service: [name], [name]. 02/14/2023 9:45 A M Medical Record Number: [id number] Patient Account Number: [id number] Date of Birth/Sex: Treating RN: 08-03-1956 (67 y.o. [name] Primary Care [name]: [name] Other Clinician: Referring Chia Mowers: Treating [name]/Extender: [name] in Treatment: 14 [name] Assessment Assessed: [Left: No]  [Right: No] [Left: [name]] [Right: :] Calf Left: Right: Point of Measurement: From Medial Instep 38.5 cm Ankle Left: Right: Point of Measurement: From Medial Instep 26 cm Vascular Assessment Pulses: Dorsalis Pedis Palpable: [Right:Yes] Electronic Signature(s) Signed: 02/14/2023 3:48:02 PM By: [name] Entered By: [name] on 02/14/2023 10:20:04 -------------------------------------------------------------------------------- Multi Wound Chart Details Patient Name: Date of Service: [name] Kendall D. 02/14/2023 9:45 A M Medical Record Number: [id number] Patient Account Number: [id number] Date of Birth/Sex: Treating RN: 1956/06/29 (67 y.o. M) Primary Care [name]: [name] Other Clinician: Referring  [name]: Treating [name]/Extender: [name] in Treatment: 14 Vital Signs Height(in): 74 Pulse(bpm): 78 Weight(lbs): 252 Blood Pressure(mmHg): 133/70 Body Mass Index(BMI): 32.4 Temperature(F): 98.4 Respiratory Rate(breaths/min): 20 [1:Photos:] [N/A:N/A] Right, Lateral Lower Leg N/A N/A Wound Location: Shear/Friction N/A N/A Wounding Event: Trauma, Other N/A N/A Primary Etiology: Congestive Heart Failure, N/A N/A Comorbid History: Hypertension, Peripheral Venous Disease, Type II Diabetes, Osteoarthritis, Neuropathy 09/16/2022 N/A N/A Date Acquired: 14 N/A N/A [name] of Treatment: Open N/A N/A Wound Status: No N/A N/A Wound Recurrence: 3.8x0.8x0.2 N/A N/A Measurements L x W x D (cm) 2.388 N/A N/A A (cm) : rea 0.478 N/A N/A Volume (cm) : 43.70% N/A N/A % Reduction in A rea: 62.40% N/A N/A % Reduction in Volume: Full Thickness Without Exposed N/A N/A Classification: Support Structures Medium N/A N/A Exudate A [name]: Serosanguineous N/A N/A Exudate Type: red, brown N/A N/A Exudate Color: Distinct, outline attached N/A N/A Wound Margin: Large (67-100%) N/A N/A Granulation A [name]: Pink N/A  N/A Granulation Quality: Small (1-33%) N/A N/A Necrotic A [name]: Fat Layer (Subcutaneous Tissue): Yes N/A N/A Exposed Structures: Fascia: No Tendon: No Muscle: No Joint: No Bone: No Small (1-33%) N/A N/A Epithelialization: Debridement - Excisional N/A N/A Debridement: Pre-procedure Verification/Time Out 10:20 N/A N/A Taken: Lidocaine 5% topical ointment N/A N/A Pain Control: Subcutaneous, Slough N/A N/A Tissue Debrided: Skin/Subcutaneous Tissue N/A N/A Level: 0.71 N/A N/A Debridement A (sq cm): rea Curette N/A N/A Instrument: Minimum N/A N/A Bleeding: Pressure N/A N/A Hemostasis A chieved: 0 N/A N/A Procedural Pain: 0 N/A N/A Post Procedural Pain: Procedure was tolerated well N/A N/A Debridement Treatment Response: 3x0.3x0.1 N/A N/A Post Debridement Measurements L x W x D (cm) 0.071 N/A N/A Post Debridement Volume: (cm) Scarring: Yes N/A N/A Periwound Skin Texture: No Abnormalities Noted N/A N/A Periwound Skin Moisture: Hemosiderin Staining: Yes N/A N/A Periwound Skin Color: No Abnormality N/A N/A Temperature: Compression Therapy N/A N/A Procedures Performed: Debridement Treatment Notes Wound #1 (Lower Leg) Wound Laterality: Right, Lateral Cleanser Soap and Water Discharge Instruction: May shower and wash wound with dial antibacterial soap and water prior to dressing change. Wound Cleanser Discharge Instruction: Cleanse the wound with wound cleanser prior to applying a clean dressing using gauze sponges, not tissue or cotton balls. Peri-Wound Care Zinc Oxide Ointment 30g tube Discharge Instruction: Apply Zinc Oxide to periwound with each dressing change Sween Lotion (Moisturizing lotion) Discharge Instruction: Apply moisturizing lotion as directed Topical Primary Dressing Maxorb Extra Ag+ Alginate Dressing, 2x2 (in/in) Discharge Instruction: Apply to wound bed as instructed Secondary Dressing ABD Pad, 5x9 Discharge Instruction: Apply over  primary dressing as directed. [name], [name] ([id number]) [id number] Page 4 of 7 Woven Gauze Sponge, Non-Sterile 4x4 in Discharge Instruction: Apply over primary dressing as directed. Secured With Compression Wrap ThreePress (3 layer compression wrap) Discharge Instruction: Apply three layer compression as directed. Compression Stockings Add-Ons Electronic Signature(s) Signed: 02/14/2023 11:49:43 AM By: Duanne Guess MD FACS Entered By: Duanne Guess on 02/14/2023 11:49:43 -------------------------------------------------------------------------------- Multi-Disciplinary Care Plan Details Patient Name: Date of Service: [name], [name] Niemann D. 02/14/2023 9:45 A M Medical Record Number: [id number] Patient Account Number: [id number] Date of Birth/Sex: Treating RN: 05-19-1956 (67 y.o. [name] Primary Care Zackerie Sara: [name] Other Clinician: Referring Roanna Reaves: Treating Josia Cueva/Extender: [name] in Treatment: 14 Multidisciplinary Care Plan reviewed with physician Active Inactive Venous Leg Ulcer Nursing Diagnoses: Knowledge deficit related to disease process and management Potential for venous Insuffiency (use before diagnosis confirmed) Goals: Patient  will maintain optimal [name] control Date Initiated: 11/23/2022 Target Resolution Date: 05/16/2023 Goal Status: Active Interventions: Assess peripheral [name] status every visit. Compression as ordered Treatment Activities: Therapeutic compression applied : 11/23/2022 Notes: Wound/Skin Impairment Nursing Diagnoses: Impaired tissue integrity Goals: Patient/caregiver will verbalize understanding of skin care regimen Date Initiated: 11/23/2022 Target Resolution Date: 05/16/2023 Goal Status: Active Ulcer/skin breakdown will have a volume reduction of 30% by week 4 Date Initiated: 11/07/2022 Target Resolution Date: 05/16/2023 Goal Status:  Active Interventions: Assess patient/caregiver ability to obtain necessary supplies Assess patient/caregiver ability to perform ulcer/skin care regimen upon admission and as needed Assess ulceration(s) every visit Treatment Activities: Skin care regimen initiated : 11/07/2022 [name], [name] ([id number]) [phone].pdf Page 5 of 7 Topical wound management initiated : 11/07/2022 Notes: Electronic Signature(s) Signed: 02/14/2023 3:48:02 PM By: [name] Entered By: [name] on 02/14/2023 10:22:02 -------------------------------------------------------------------------------- Pain Assessment Details Patient Name: Date of Service: [name], [name] Niemann D. 02/14/2023 9:45 A M Medical Record Number: [id number] Patient Account Number: [id number] Date of Birth/Sex: Treating RN: 01/22/1956 (67 y.o. M) Primary Care Aubria Vanecek: [name] Other Clinician: Referring Malai Lady: Treating Vennie Waymire/Extender: [name] in Treatment: 14 Active Problems Location of Pain Severity and Description of Pain Patient Has Paino No Site Locations Pain Management and Medication Current Pain Management: Electronic Signature(s) Signed: 02/14/2023 11:32:44 AM By: [name] Entered By: [name] on 02/14/2023 09:53:23 -------------------------------------------------------------------------------- Patient/Caregiver Education Details Patient Name: Date of Service: [name], [name] 4/30/2024andnbsp9:45 A M Medical Record Number: [id number] Patient Account Number: [id number] Date of Birth/Gender: Treating RN: 1955/10/24 (67 y.o. [name] Primary Care Physician: [name] Other Clinician: Referring Physician: Treating Physician/Extender: [name] in Treatment: 14 Education Assessment Education Provided To: Patient Education Topics Provided Wound/Skin Impairment: Methods:  Explain/Verbal Responses: State content correctly [name], [name] ([id number]) [id number] Page 6 of 7 Electronic Signature(s) Signed: 02/14/2023 3:48:02 PM By: [name] Entered By: [name] on 02/14/2023 10:22:18 -------------------------------------------------------------------------------- Wound Assessment Details Patient Name: Date of Service: [name], [name] Niemann D. 02/14/2023 9:45 A M Medical Record Number: [id number] Patient Account Number: [id number] Date of Birth/Sex: Treating RN: 27-Jun-1956 (67 y.o. M) Primary Care Noella Kipnis: [name] Other Clinician: Referring Jamilee Lafosse: Treating Boyce Keltner/Extender: [name] in Treatment: 14 Wound Status Wound Number: 1 Primary Trauma, Other Etiology: Wound Location: Right, Lateral Lower Leg Wound Open Wounding Event: Shear/Friction Status: Date Acquired: 09/16/2022 Comorbid Congestive Heart Failure, Hypertension, Peripheral Venous [name] Of Treatment: 14 History: Disease, Type II Diabetes, Osteoarthritis, Neuropathy Clustered Wound: No Photos Wound Measurements Length: (cm) 3.8 Width: (cm) 0.8 Depth: (cm) 0.2 Area: (cm) 2.388 Volume: (cm) 0.478 % Reduction in Area: 43.7% % Reduction in Volume: 62.4% Epithelialization: Small (1-33%) Tunneling: No Undermining: No Wound Description Classification: Full Thickness Without Exposed Suppor Wound Margin: Distinct, outline attached Exudate Amount: Medium Exudate Type: Serosanguineous Exudate Color: red, brown t Structures Foul Odor After Cleansing: No Slough/Fibrino Yes Wound Bed Granulation Amount: Large (67-100%) Exposed Structure Granulation Quality: Pink Fascia Exposed: No Necrotic Amount: Small (1-33%) Fat Layer (Subcutaneous Tissue) Exposed: Yes Necrotic Quality: Adherent Slough Tendon Exposed: No Muscle Exposed: No Joint Exposed: No Bone Exposed: No Periwound Skin Texture Texture Color No  Abnormalities Noted: No No Abnormalities Noted: No Scarring: Yes Hemosiderin Staining: Yes Moisture Temperature / Pain No Abnormalities Noted: Yes Temperature: No Abnormality Treatment Notes [name], [name] ([id number]) [id number] Page 7 of 7 Wound #1 (Lower Leg) Wound  Laterality: Right, Lateral Cleanser Soap and Water Discharge Instruction: May shower and wash wound with dial antibacterial soap and water prior to dressing change. Wound Cleanser Discharge Instruction: Cleanse the wound with wound cleanser prior to applying a clean dressing using gauze sponges, not tissue or cotton balls. Peri-Wound Care Zinc Oxide Ointment 30g tube Discharge Instruction: Apply Zinc Oxide to periwound with each dressing change Sween Lotion (Moisturizing lotion) Discharge Instruction: Apply moisturizing lotion as directed Topical Primary Dressing Maxorb Extra Ag+ Alginate Dressing, 2x2 (in/in) Discharge Instruction: Apply to wound bed as instructed Secondary Dressing ABD Pad, 5x9 Discharge Instruction: Apply over primary dressing as directed. Woven Gauze Sponge, Non-Sterile 4x4 in Discharge Instruction: Apply over primary dressing as directed. Secured With Compression Wrap ThreePress (3 layer compression wrap) Discharge Instruction: Apply three layer compression as directed. Compression Stockings Add-Ons Electronic Signature(s) Signed: 02/14/2023 3:48:02 PM By: [name] Entered By: [name] on 02/14/2023 10:21:00 -------------------------------------------------------------------------------- Vitals Details Patient Name: Date of Service: [name], [name] Niemann D. 02/14/2023 9:45 A M Medical Record Number: [id number] Patient Account Number: [id number] Date of Birth/Sex: Treating RN: May 10, 1956 (67 y.o. M) Primary Care Jayle Solarz: [name] Other Clinician: Referring Normon Pettijohn: Treating Janine Reller/Extender: [name] in  Treatment: 14 Vital Signs Time Taken: 09:52 Temperature (F): 98.4 Height (in): 74 Pulse (bpm): 78 Weight (lbs): 252 Respiratory Rate (breaths/min): 20 Body Mass Index (BMI): 32.4 Blood Pressure (mmHg): 133/70 Reference Range: 80 - 120 mg / dl Electronic Signature(s) Signed: 02/14/2023 11:32:44 AM By: [name] Entered By: [name] on 02/14/2023 09:53:16

## 2023-02-21 ENCOUNTER — Encounter (HOSPITAL_BASED_OUTPATIENT_CLINIC_OR_DEPARTMENT_OTHER): Payer: Medicare HMO | Attending: General Surgery | Admitting: General Surgery

## 2023-02-21 DIAGNOSIS — M1990 Unspecified osteoarthritis, unspecified site: Secondary | ICD-10-CM | POA: Insufficient documentation

## 2023-02-21 DIAGNOSIS — I110 Hypertensive heart disease with heart failure: Secondary | ICD-10-CM | POA: Insufficient documentation

## 2023-02-21 DIAGNOSIS — E1142 Type 2 diabetes mellitus with diabetic polyneuropathy: Secondary | ICD-10-CM | POA: Diagnosis not present

## 2023-02-21 DIAGNOSIS — L84 Corns and callosities: Secondary | ICD-10-CM | POA: Diagnosis not present

## 2023-02-21 DIAGNOSIS — Z89421 Acquired absence of other right toe(s): Secondary | ICD-10-CM | POA: Insufficient documentation

## 2023-02-21 DIAGNOSIS — E785 Hyperlipidemia, unspecified: Secondary | ICD-10-CM | POA: Diagnosis not present

## 2023-02-21 DIAGNOSIS — E11621 Type 2 diabetes mellitus with foot ulcer: Secondary | ICD-10-CM | POA: Insufficient documentation

## 2023-02-21 DIAGNOSIS — I429 Cardiomyopathy, unspecified: Secondary | ICD-10-CM | POA: Diagnosis not present

## 2023-02-21 DIAGNOSIS — S81801A Unspecified open wound, right lower leg, initial encounter: Secondary | ICD-10-CM | POA: Diagnosis not present

## 2023-02-21 DIAGNOSIS — Z87891 Personal history of nicotine dependence: Secondary | ICD-10-CM | POA: Diagnosis not present

## 2023-02-21 DIAGNOSIS — L97819 Non-pressure chronic ulcer of other part of right lower leg with unspecified severity: Secondary | ICD-10-CM | POA: Insufficient documentation

## 2023-02-21 DIAGNOSIS — E11622 Type 2 diabetes mellitus with other skin ulcer: Secondary | ICD-10-CM | POA: Diagnosis not present

## 2023-02-21 DIAGNOSIS — I5022 Chronic systolic (congestive) heart failure: Secondary | ICD-10-CM | POA: Diagnosis not present

## 2023-02-21 DIAGNOSIS — G894 Chronic pain syndrome: Secondary | ICD-10-CM | POA: Diagnosis not present

## 2023-02-21 DIAGNOSIS — E1151 Type 2 diabetes mellitus with diabetic peripheral angiopathy without gangrene: Secondary | ICD-10-CM | POA: Diagnosis not present

## 2023-02-21 NOTE — Progress Notes (Addendum)
HARLES, KAUK ([id number]) [id number] Page 1 of 9 Visit Report for 02/21/2023 Chief Complaint Document Details Patient Name: Date of Service: [name], [name] 02/21/2023 9:45 A M Medical Record Number: [id number] Patient Account Number: [id number] Date of Birth/Sex: Treating RN: 1956/09/04 (67 y.o. M) Primary Care Provider: Babs Sciara Other Clinician: Referring Provider: Treating Provider/Extender: Myriam Jacobson in Treatment: 15 Information Obtained from: Patient Chief Complaint Patients presents for treatment of an open diabetic ulcer on his right foot, as well as ulcers on his right lower leg and ankle Electronic Signature(s) Signed: 02/21/2023 10:22:41 AM By: Duanne Guess MD FACS Entered By: Duanne Guess on 02/21/2023 10:22:40 -------------------------------------------------------------------------------- Debridement Details Patient Name: Date of Service: [name], [name] D. 02/21/2023 9:45 A M Medical Record Number: [id number] Patient Account Number: [id number] Date of Birth/Sex: Treating RN: 09-21-1956 (67 y.o. Dianna [name] Primary Care Provider: Babs Sciara Other Clinician: Referring Provider: Treating Provider/Extender: Myriam Jacobson in Treatment: 15 Debridement Performed for Assessment: Wound #1 Right,Lateral Lower Leg Performed By: Physician Duanne Guess, MD Debridement Type: Debridement Level of Consciousness (Pre-procedure): Awake and Alert Pre-procedure Verification/Time Out Yes - 10:18 Taken: Start Time: 10:18 Pain Control: Lidocaine 4% Topical Solution Percent of Wound Bed Debrided: 100% T Area Debrided (cm): otal 0.98 Tissue and other material debrided: Eschar, Slough, Slough Level: Non-Viable Tissue Debridement Description: Selective/Open Wound Instrument: Curette Bleeding: Minimum Hemostasis Achieved: Pressure End Time: 10:19 Procedural Pain: 0 Post  Procedural Pain: 0 Response to Treatment: Procedure was tolerated well Level of Consciousness (Post- Awake and Alert procedure): Post Debridement Measurements of Total Wound Length: (cm) 2.5 Width: (cm) 0.5 Depth: (cm) 0.1 Volume: (cm) 0.098 Character of Wound/Ulcer Post Debridement: Improved Post Procedure Diagnosis Same as Pre-procedure Notes Scribed for Dr. Lady Gary by J.Scotton Electronic Signature(s) Signed: 02/21/2023 10:26:20 AM By: Duanne Guess MD FACS Benson [place] ([id number]) [phone].pdf Page 2 of 9 Signed: 02/21/2023 4:17:14 PM By: Karie Schwalbe RN Entered By: Karie Schwalbe on 02/21/2023 10:22:41 -------------------------------------------------------------------------------- HPI Details Patient Name: Date of Service: [name], [name] D. 02/21/2023 9:45 A M Medical Record Number: [id number] Patient Account Number: [id number] Date of Birth/Sex: Treating RN: 1956-10-03 (67 y.o. M) Primary Care Provider: Babs Sciara Other Clinician: Referring Provider: Treating Provider/Extender: Myriam Jacobson in Treatment: 15 History of Present Illness HPI Description: ADMISSION 11/07/2022 This is a 67 year old man with a past medical history significant for type 2 diabetes (last hemoglobin A1c 7.1), cerebrovascular occlusive disease, coronary artery disease, peripheral vascular disease, congestive heart failure, chronic pain syndrome, and prior amputation of his right great and second toes. He has a remote history of surgery on his right ankle and apparently as his legs have become increasingly more swollen and he has insisted upon wearing boots, the pressure and friction seem to have opened up wounds along the old scar line. In addition, he has a large callus on the third metatarsal head, related to his acquired foot deformity. He sees a podiatrist who periodically shaves this callus down and he was also recommended orthotics,  but the patient has refused them because he likes his boots. There is an area of discoloration in the lower portion of this callus, suspicious for an open wound. 11/14/2022: The ulcer on his third metatarsal head has closed. The lateral leg ulcers are both about the same size, but he says that they feel better. There is slough accumulation in both sites. 11/23/2022: The ulcer on his  foot remains closed. The lateral leg ulcers are still unchanged in size, but are a bit cleaner, just with some slough accumulation. 12/01/2022: No significant change to the size of the wounds on his lateral leg. The larger is quite clean with good granulation tissue present. The smaller still has fairly thick slough buildup. 12/09/2022: Both wounds are smaller today. They still have slough accumulation, but less so than at his previous visit. The callus on his foot has built up again and is uncomfortable. 12/16/2022: The wound at his ankle is nearly closed with just a little slough on the remaining open surface. The lateral leg wound is about the same size but has less slough accumulation. We are still working on Therapist, occupational for wound VAC. 12/23/2022: The wound is ankle is down to just a pinhole with a small amount of slough and eschar present. The lateral leg wound has contracted in length and is also shallower. There is slough on the surface. We still have not secured insurance approval for his wound VAC. 01/05/2023: The ankle wound is healed underneath a layer of eschar. The lateral leg wound is smaller and has some slough accumulation. He was finally approved for a wound VAC and has it with him today. 01/13/2023: The wound depth has come in considerably and the overall wound size has contracted as well. There is just a little slough on the surface. He is having an excellent response to negative pressure wound therapy. 01/19/2023: The wound is smaller and shallower again today. There is some slough accumulation on the  surface. 01/27/2023: The wound is smaller again today and is essentially flush with the surrounding skin. The wound on his second metatarsal head has reopened under a thick layer of callus. 02/06/2023: The foot wound has healed. The lateral leg wound is smaller today with some slough accumulation. 02/14/2023: The lateral leg wound is considerably narrower today. There is a little bit of slough buildup. He says the callus on his second metatarsal head has built up to the point that it is bothering him again. 02/21/2023: His wound is smaller again today. There is epithelium encroaching in from all sides. There is a little bit of eschar at the proximal aspect of the wound with light slough in a couple of pockets on the surface. Edema control is good. Electronic Signature(s) Signed: 02/21/2023 10:23:27 AM By: Duanne Guess MD FACS Entered By: Duanne Guess on 02/21/2023 10:23:27 -------------------------------------------------------------------------------- Physical Exam Details Patient Name: Date of Service: [name], [name] D. 02/21/2023 9:45 A M Medical Record Number: [id number] Patient Account Number: [id number] Date of Birth/Sex: Treating RN: 25-Dec-1955 (67 y.o. M) Primary Care Provider: Babs Sciara Other Clinician: Referring Provider: Treating Provider/Extender: Marchelle Folks, Scott A Weeks in Treatment: 15 Constitutional Hypertensive, asymptomatic. . . . no acute distress. COLON, DELAOSSA ([id number]) [id number] Page 3 of 9 Respiratory Normal work of breathing on room air. Notes 02/21/2023: His wound is smaller again today. There is epithelium encroaching in from all sides. There is a little bit of eschar at the proximal aspect of the wound with light slough in a couple of pockets on the surface. Edema control is good. Electronic Signature(s) Signed: 02/21/2023 10:23:54 AM By: Duanne Guess MD FACS Entered By: Duanne Guess on 02/21/2023  10:23:54 -------------------------------------------------------------------------------- Physician Orders Details Patient Name: Date of Service: [name], [name] D. 02/21/2023 9:45 A M Medical Record Number: [id number] Patient Account Number: [id number] Date of Birth/Sex: Treating RN: 01-Jun-1956 (67 y.o. Dianna [name] Primary Care  Provider: Babs Sciara Other Clinician: Referring Provider: Treating Provider/Extender: Myriam Jacobson in Treatment: 15 Verbal / Phone Orders: No Diagnosis Coding ICD-10 Coding Code Description L97.819 Non-pressure chronic ulcer of other part of right lower leg with unspecified severity E11.622 Type 2 diabetes mellitus with other skin ulcer E11.621 Type 2 diabetes mellitus with foot ulcer I50.22 Chronic systolic (congestive) heart failure I73.9 Peripheral vascular disease, unspecified E11.42 Type 2 diabetes mellitus with diabetic polyneuropathy I63.9 Cerebral infarction, unspecified I10 Essential (primary) hypertension G89.4 Chronic pain syndrome L84 Corns and callosities Follow-up Appointments ppointment in 1 week. - Dr. Lady Gary Room 3 Return A Anesthetic Wound #1 Right,Lateral Lower Leg (In clinic) Topical Lidocaine 4% applied to wound bed Bathing/ Shower/ Hygiene May shower with protection but do not get wound dressing(s) wet. Protect dressing(s) with water repellant cover (for example, large plastic bag) or a cast cover and may then take shower. - May purchase cast protector at [facility], [facility], or [facility] Edema Control - Lymphedema / SCD / Other Right Lower Extremity Elevate legs to the level of the heart or above for 30 minutes daily and/or when sitting for 3-4 times a day throughout the day. Avoid standing for long periods of time. Exercise regularly Off-Loading Other: - Wear prescribed foot wear (Orthotics). Do not wear the boots. Wound Treatment Wound #1 - Lower Leg Wound Laterality: Right, Lateral Cleanser:  Soap and Water 1 x Per Week/30 Days Discharge Instructions: May shower and wash wound with dial antibacterial soap and water prior to dressing change. Cleanser: Wound Cleanser 1 x Per Week/30 Days Discharge Instructions: Cleanse the wound with wound cleanser prior to applying a clean dressing using gauze sponges, not tissue or cotton balls. Peri-Wound Care: Zinc Oxide Ointment 30g tube 1 x Per Week/30 Days Discharge Instructions: Apply Zinc Oxide to periwound with each dressing change Peri-Wound Care: Sween Lotion (Moisturizing lotion) 1 x Per Week/30 Days Discharge Instructions: Apply moisturizing lotion as directed SHANGO, PIPPENGER ([id number]) [phone].pdf Page 4 of 9 Prim Dressing: Maxorb Extra Ag+ Alginate Dressing, 2x2 (in/in) 1 x Per Week/30 Days ary Discharge Instructions: Apply to wound bed as instructed Secondary Dressing: ABD Pad, 5x9 1 x Per Week/30 Days Discharge Instructions: Apply over primary dressing as directed. Secondary Dressing: Woven Gauze Sponge, Non-Sterile 4x4 in 1 x Per Week/30 Days Discharge Instructions: Apply over primary dressing as directed. Compression Wrap: ThreePress (3 layer compression wrap) 1 x Per Week/30 Days Discharge Instructions: Apply three layer compression as directed. Electronic Signature(s) Signed: 02/21/2023 10:26:20 AM By: Duanne Guess MD FACS Entered By: Duanne Guess on 02/21/2023 10:24:34 -------------------------------------------------------------------------------- Problem List Details Patient Name: Date of Service: [name], [name] D. 02/21/2023 9:45 A M Medical Record Number: [id number] Patient Account Number: [id number] Date of Birth/Sex: Treating RN: [birth year] (67 y.o. M) Primary Care Provider: Babs Sciara Other Clinician: Referring Provider: Treating Provider/Extender: Myriam Jacobson in Treatment: 15 Active Problems ICD-10 Encounter Code Description Active Date  MDM Diagnosis L97.819 Non-pressure chronic ulcer of other part of right lower leg with unspecified 11/07/2022 No Yes severity E11.622 Type 2 diabetes mellitus with other skin ulcer 11/07/2022 No Yes E11.621 Type 2 diabetes mellitus with foot ulcer 11/07/2022 No Yes I50.22 Chronic systolic (congestive) heart failure 11/07/2022 No Yes I73.9 Peripheral vascular disease, unspecified 11/07/2022 No Yes E11.42 Type 2 diabetes mellitus with diabetic polyneuropathy 11/07/2022 No Yes I63.9 Cerebral infarction, unspecified 11/07/2022 No Yes I10 Essential (primary) hypertension 11/07/2022 No Yes G89.4 Chronic pain syndrome 11/07/2022 No Yes  L84 Corns and callosities 12/09/2022 No Yes WARNER, BARLING ([id number]) [phone].pdf Page 5 of 9 Inactive Problems ICD-10 Code Description Active Date Inactive Date L97.512 Non-pressure chronic ulcer of other part of right foot with fat layer exposed 11/07/2022 11/07/2022 Resolved Problems ICD-10 Code Description Active Date Resolved Date L97.312 Non-pressure chronic ulcer of right ankle with fat layer exposed 11/07/2022 11/07/2022 Electronic Signature(s) Signed: 02/21/2023 10:22:05 AM By: Duanne Guess MD FACS Entered By: Duanne Guess on 02/21/2023 10:22:04 -------------------------------------------------------------------------------- Progress Note Details Patient Name: Date of Service: [name], [name] D. 02/21/2023 9:45 A M Medical Record Number: [id number] Patient Account Number: [id number] Date of Birth/Sex: Treating RN: 21-Feb-1956 (67 y.o. M) Primary Care Provider: Babs Sciara Other Clinician: Referring Provider: Treating Provider/Extender: Myriam Jacobson in Treatment: 15 Subjective Chief Complaint Information obtained from Patient Patients presents for treatment of an open diabetic ulcer on his right foot, as well as ulcers on his right lower leg and ankle History of Present Illness  (HPI) ADMISSION 11/07/2022 This is a 67 year old man with a past medical history significant for type 2 diabetes (last hemoglobin A1c 7.1), cerebrovascular occlusive disease, coronary artery disease, peripheral vascular disease, congestive heart failure, chronic pain syndrome, and prior amputation of his right great and second toes. He has a remote history of surgery on his right ankle and apparently as his legs have become increasingly more swollen and he has insisted upon wearing boots, the pressure and friction seem to have opened up wounds along the old scar line. In addition, he has a large callus on the third metatarsal head, related to his acquired foot deformity. He sees a podiatrist who periodically shaves this callus down and he was also recommended orthotics, but the patient has refused them because he likes his boots. There is an area of discoloration in the lower portion of this callus, suspicious for an open wound. 11/14/2022: The ulcer on his third metatarsal head has closed. The lateral leg ulcers are both about the same size, but he says that they feel better. There is slough accumulation in both sites. 11/23/2022: The ulcer on his foot remains closed. The lateral leg ulcers are still unchanged in size, but are a bit cleaner, just with some slough accumulation. 12/01/2022: No significant change to the size of the wounds on his lateral leg. The larger is quite clean with good granulation tissue present. The smaller still has fairly thick slough buildup. 12/09/2022: Both wounds are smaller today. They still have slough accumulation, but less so than at his previous visit. The callus on his foot has built up again and is uncomfortable. 12/16/2022: The wound at his ankle is nearly closed with just a little slough on the remaining open surface. The lateral leg wound is about the same size but has less slough accumulation. We are still working on Therapist, occupational for wound VAC. 12/23/2022: The  wound is ankle is down to just a pinhole with a small amount of slough and eschar present. The lateral leg wound has contracted in length and is also shallower. There is slough on the surface. We still have not secured insurance approval for his wound VAC. 01/05/2023: The ankle wound is healed underneath a layer of eschar. The lateral leg wound is smaller and has some slough accumulation. He was finally approved for a wound VAC and has it with him today. 01/13/2023: The wound depth has come in considerably and the overall wound size has contracted as well. There is just a  little slough on the surface. He is having an excellent response to negative pressure wound therapy. 01/19/2023: The wound is smaller and shallower again today. There is some slough accumulation on the surface. 01/27/2023: The wound is smaller again today and is essentially flush with the surrounding skin. The wound on his second metatarsal head has reopened under a thick layer of callus. 02/06/2023: The foot wound has healed. The lateral leg wound is smaller today with some slough accumulation. LAPATRICK, DIMITROFF ([id number]) [id number] Page 6 of 9 02/14/2023: The lateral leg wound is considerably narrower today. There is a little bit of slough buildup. He says the callus on his second metatarsal head has built up to the point that it is bothering him again. 02/21/2023: His wound is smaller again today. There is epithelium encroaching in from all sides. There is a little bit of eschar at the proximal aspect of the wound with light slough in a couple of pockets on the surface. Edema control is good. Patient History Family History Cancer - Father,Mother,Siblings, Diabetes - Mother,Siblings, Heart Disease, Lung Disease - Father, No family history of Hereditary Spherocytosis, Hypertension, Kidney Disease, Seizures, Stroke, Thyroid Problems, Tuberculosis. Social History Former smoker - ended on 12/23/1970, Marital  Status - Married, Alcohol Use - Never, Drug Use - No History, Caffeine Use - Daily. Medical History Eyes Denies history of Cataracts, Glaucoma, Optic Neuritis Ear/Nose/Mouth/Throat Denies history of Chronic sinus problems/congestion, Middle ear problems Respiratory Denies history of Aspiration, Asthma, Chronic Obstructive Pulmonary Disease (COPD), Pneumothorax, Sleep Apnea, Tuberculosis Cardiovascular Patient has history of Congestive Heart Failure, Hypertension, Peripheral Venous Disease Endocrine Patient has history of Type II Diabetes Musculoskeletal Patient has history of Osteoarthritis Neurologic Patient has history of Neuropathy Oncologic Denies history of Received Chemotherapy, Received Radiation Psychiatric Denies history of Anorexia/bulimia, Confinement Anxiety Medical A Surgical History Notes nd Cardiovascular cardiomyopathy hyperlipidemia hypokalemia stroke Genitourinary orchitis Objective Constitutional Hypertensive, asymptomatic. no acute distress. Vitals Time Taken: 10:10 AM, Height: 74 in, Weight: 252 lbs, BMI: 32.4, Temperature: 97.7 F, Pulse: 91 bpm, Respiratory Rate: 16 breaths/min, Blood Pressure: 168/76 mmHg. Respiratory Normal work of breathing on room air. General Notes: 02/21/2023: His wound is smaller again today. There is epithelium encroaching in from all sides. There is a little bit of eschar at the proximal aspect of the wound with light slough in a couple of pockets on the surface. Edema control is good. Integumentary (Hair, Skin) Wound #1 status is Open. Original cause of wound was Shear/Friction. The date acquired was: 09/16/2022. The wound has been in treatment 15 weeks. The wound is located on the Right,Lateral Lower Leg. The wound measures 2.5cm length x 0.5cm width x 0.1cm depth; 0.982cm^2 area and 0.098cm^3 volume. There is Fat Layer (Subcutaneous Tissue) exposed. There is no tunneling or undermining noted. There is a medium amount of  serosanguineous drainage noted. The wound margin is distinct with the outline attached to the wound base. There is large (67-100%) pink granulation within the wound bed. There is a small (1- 33%) amount of necrotic tissue within the wound bed including Adherent Slough. The periwound skin appearance had no abnormalities noted for moisture. The periwound skin appearance exhibited: Scarring, Hemosiderin Staining. Periwound temperature was noted as No Abnormality. Assessment Active Problems ICD-10 Non-pressure chronic ulcer of other part of right lower leg with unspecified severity Type 2 diabetes mellitus with other skin ulcer Type 2 diabetes mellitus with foot ulcer Chronic systolic (congestive) heart failure Peripheral vascular disease, unspecified Type 2 diabetes mellitus with diabetic polyneuropathy Rabadan,  Weber Cooks ([id number]) [id number] Page 7 of 9 Cerebral infarction, unspecified Essential (primary) hypertension Chronic pain syndrome Corns and callosities Procedures Wound #1 Pre-procedure diagnosis of Wound #1 is a Trauma, Other located on the Right,Lateral Lower Leg . There was a Selective/Open Wound Non-Viable Tissue Debridement with a total area of 0.98 sq cm performed by Duanne Guess, MD. With the following instrument(s): Curette Material removed includes Eschar and Slough and after achieving pain control using Lidocaine 4% Topical Solution. No specimens were taken. A time out was conducted at 10:18, prior to the start of the procedure. A Minimum amount of bleeding was controlled with Pressure. The procedure was tolerated well with a pain level of 0 throughout and a pain level of 0 following the procedure. Post Debridement Measurements: 2.5cm length x 0.5cm width x 0.1cm depth; 0.098cm^3 volume. Character of Wound/Ulcer Post Debridement is improved. Post procedure Diagnosis Wound #1: Same as Pre-Procedure General Notes: Scribed for Dr. Lady Gary by  J.Scotton. Plan Follow-up Appointments: Return Appointment in 1 week. - Dr. Lady Gary Room 3 Anesthetic: Wound #1 Right,Lateral Lower Leg: (In clinic) Topical Lidocaine 4% applied to wound bed Bathing/ Shower/ Hygiene: May shower with protection but do not get wound dressing(s) wet. Protect dressing(s) with water repellant cover (for example, large plastic bag) or a cast cover and may then take shower. - May purchase cast protector at [facility], [facility], or [facility] Edema Control - Lymphedema / SCD / Other: Elevate legs to the level of the heart or above for 30 minutes daily and/or when sitting for 3-4 times a day throughout the day. Avoid standing for long periods of time. Exercise regularly Off-Loading: Other: - Wear prescribed foot wear (Orthotics). Do not wear the boots. WOUND #1: - Lower Leg Wound Laterality: Right, Lateral Cleanser: Soap and Water 1 x Per Week/30 Days Discharge Instructions: May shower and wash wound with dial antibacterial soap and water prior to dressing change. Cleanser: Wound Cleanser 1 x Per Week/30 Days Discharge Instructions: Cleanse the wound with wound cleanser prior to applying a clean dressing using gauze sponges, not tissue or cotton balls. Peri-Wound Care: Zinc Oxide Ointment 30g tube 1 x Per Week/30 Days Discharge Instructions: Apply Zinc Oxide to periwound with each dressing change Peri-Wound Care: Sween Lotion (Moisturizing lotion) 1 x Per Week/30 Days Discharge Instructions: Apply moisturizing lotion as directed Prim Dressing: Maxorb Extra Ag+ Alginate Dressing, 2x2 (in/in) 1 x Per Week/30 Days ary Discharge Instructions: Apply to wound bed as instructed Secondary Dressing: ABD Pad, 5x9 1 x Per Week/30 Days Discharge Instructions: Apply over primary dressing as directed. Secondary Dressing: Woven Gauze Sponge, Non-Sterile 4x4 in 1 x Per Week/30 Days Discharge Instructions: Apply over primary dressing as directed. Com pression Wrap: ThreePress (3 layer  compression wrap) 1 x Per Week/30 Days Discharge Instructions: Apply three layer compression as directed. 02/21/2023: His wound is smaller again today. There is epithelium encroaching in from all sides. There is a little bit of eschar at the proximal aspect of the wound with light slough in a couple of pockets on the surface. Edema control is good. I used a curette to debride slough and eschar from the wound. We will continue silver alginate with 3 layer compression equivalent. Follow-up in 1 week. Electronic Signature(s) Signed: 02/21/2023 10:24:59 AM By: Duanne Guess MD FACS Entered By: Duanne Guess on 02/21/2023 10:24:58 -------------------------------------------------------------------------------- HxROS Details Patient Name: Date of Service: [name], [name] D. 02/21/2023 9:45 A M Medical Record Number: [id number] Patient Account Number: [id number] Date of Birth/Sex: Treating  RN: [birth year] (67 y.o. M) Primary Care Provider: Babs Sciara Other Clinician: Referring Provider: Treating Provider/Extender: Myriam Jacobson in Treatment: [address] [place] ([id number]) [id number] Page 8 of 9 Eyes Medical History: Negative for: Cataracts; Glaucoma; Optic Neuritis Ear/Nose/Mouth/Throat Medical History: Negative for: Chronic sinus problems/congestion; Middle ear problems Respiratory Medical History: Negative for: Aspiration; Asthma; Chronic Obstructive Pulmonary Disease (COPD); Pneumothorax; Sleep Apnea; Tuberculosis Cardiovascular Medical History: Positive for: Congestive Heart Failure; Hypertension; Peripheral Venous Disease Past Medical History Notes: cardiomyopathy hyperlipidemia hypokalemia stroke Endocrine Medical History: Positive for: Type II Diabetes Time with diabetes: 20 years Treated with: Insulin, Oral agents Genitourinary Medical History: Past Medical History Notes: orchitis Musculoskeletal Medical  History: Positive for: Osteoarthritis Neurologic Medical History: Positive for: Neuropathy Oncologic Medical History: Negative for: Received Chemotherapy; Received Radiation Psychiatric Medical History: Negative for: Anorexia/bulimia; Confinement Anxiety Immunizations Pneumococcal Vaccine: Received Pneumococcal Vaccination: Yes Received Pneumococcal Vaccination On or After 60th Birthday: Yes Implantable Devices No devices added Family and Social History Cancer: Yes - Father,Mother,Siblings; Diabetes: Yes - Mother,Siblings; Heart Disease: Yes; Hereditary Spherocytosis: No; Hypertension: No; Kidney Disease: No; Lung Disease: Yes - Father; Seizures: No; Stroke: No; Thyroid Problems: No; Tuberculosis: No; Former smoker - ended on 12/23/1970; Marital Status - Married; Alcohol Use: Never; Drug Use: No History; Caffeine Use: Daily; Financial Concerns: No; Food, Clothing or Shelter Needs: No; Support System Lacking: No; Transportation Concerns: No Electronic Signature(s) Signed: 02/21/2023 10:26:20 AM By: Duanne Guess MD FACS [place] ([id number]) [id number] Page 9 of 9 Entered By: Duanne Guess on 02/21/2023 10:23:33 -------------------------------------------------------------------------------- SuperBill Details Patient Name: Date of Service: [place] [name], [name] 02/21/2023 Medical Record Number: [id number] Patient Account Number: [id number] Date of Birth/Sex: Treating RN: [birth year] (67 y.o. M) Primary Care Provider: Babs Sciara Other Clinician: Referring Provider: Treating Provider/Extender: Myriam Jacobson in Treatment: 15 Diagnosis Coding ICD-10 Codes Code Description L97.819 Non-pressure chronic ulcer of other part of right lower leg with unspecified severity E11.622 Type 2 diabetes mellitus with other skin ulcer E11.621 Type 2 diabetes mellitus with foot ulcer I50.22 Chronic systolic (congestive) heart  failure I73.9 Peripheral vascular disease, unspecified E11.42 Type 2 diabetes mellitus with diabetic polyneuropathy I63.9 Cerebral infarction, unspecified I10 Essential (primary) hypertension G89.4 Chronic pain syndrome L84 Corns and callosities Facility Procedures : CPT4 Code: [id number] Description: 97597 - DEBRIDE WOUND 1ST 20 SQ CM OR < ICD-10 Diagnosis Description L97.819 Non-pressure chronic ulcer of other part of right lower leg with unspecified seve Modifier: rity Quantity: 1 Physician Procedures : CPT4 Code Description Modifier [id number] 99213 - WC PHYS LEVEL 3 - EST PT 25 ICD-10 Diagnosis Description L97.819 Non-pressure chronic ulcer of other part of right lower leg with unspecified severity E11.622 Type 2 diabetes mellitus with other skin ulcer  I50.22 Chronic systolic (congestive) heart failure I73.9 Peripheral vascular disease, unspecified Quantity: 1 : [id number] 97597 - WC PHYS DEBR WO ANESTH 20 SQ CM ICD-10 Diagnosis Description L97.819 Non-pressure chronic ulcer of other part of right lower leg with unspecified severity Quantity: 1 Electronic Signature(s) Signed: 02/21/2023 10:25:15 AM By: Duanne Guess MD FACS Entered By: Duanne Guess on 02/21/2023 10:25:15

## 2023-02-21 NOTE — Progress Notes (Signed)
CRISTON, BOKOR ([id number]) [id number] Page 1 of 6 Visit Report for 02/21/2023 Arrival Information Details Patient Name: Date of Service: [name] 02/21/2023 9:45 A M Medical Record Number: [id number] Patient Account Number: [id number] Date of Birth/Sex: Treating RN: Apr 25, 1956 (67 y.o. Marlan Palau Primary Care Elliyah Liszewski: Babs Sciara Other Clinician: Referring Carroll Lingelbach: Treating Jaedin Regina/Extender: Myriam Jacobson in Treatment: 15 Visit Information History Since Last Visit Added or deleted any medications: No Patient Arrived: Ambulatory Any new allergies or adverse reactions: No Arrival Time: 10:10 Had a fall or experienced change in No Accompanied By: wife activities of daily living that may affect Transfer Assistance: None risk of falls: Patient Identification Verified: Yes Signs or symptoms of abuse/neglect since last visito No Secondary Verification Process Completed: Yes Hospitalized since last visit: No Patient Requires Transmission-Based Precautions: No Implantable device outside of the clinic excluding No Patient Has Alerts: Yes cellular tissue based products placed in the center Patient Alerts: ABI RLE [place] since last visit: Has Dressing in Place as Prescribed: Yes Has Compression in Place as Prescribed: Yes Pain Present Now: No Electronic Signature(s) Signed: 02/21/2023 3:53:37 PM By: Samuella Bruin Entered By: Samuella Bruin on 02/21/2023 10:10:28 -------------------------------------------------------------------------------- Encounter Discharge Information Details Patient Name: Date of Service: [name] RE, [name] 02/21/2023 9:45 A M Medical Record Number: [id number] Patient Account Number: [id number] Date of Birth/Sex: Treating RN: 1956-01-11 (67 y.o. Dianna Limbo Primary Care Anthoney Sheppard: Babs Sciara Other Clinician: Referring Landyn Lorincz: Treating Deondrea Aguado/Extender: Myriam Jacobson in Treatment: 15 Encounter Discharge Information Items Post Procedure Vitals Discharge Condition: Stable Temperature (F): 97.7 Ambulatory Status: Ambulatory Pulse (bpm): 91 Discharge Destination: Home Respiratory Rate (breaths/min): 16 Transportation: Private Auto Blood Pressure (mmHg): 168/76 Accompanied By: spouse Schedule Follow-up Appointment: Yes Clinical Summary of Care: Patient Declined Electronic Signature(s) Signed: 02/21/2023 4:17:14 PM By: Karie Schwalbe RN Entered By: Karie Schwalbe on 02/21/2023 11:53:16 -------------------------------------------------------------------------------- Lower Extremity Assessment Details Patient Name: Date of Service: [name] 02/21/2023 9:45 A M Medical Record Number: [id number] Patient Account Number: [id number] Date of Birth/Sex: Treating RN: 1956/01/20 (67 y.o. Marlan Palau Primary Care Donivan Thammavong: Babs Sciara Other Clinician: Referring Taji Sather: Treating Coleston Dirosa/Extender: Elmer Ramp Weeks in Treatment: 15 Edema Assessment W[Left: AREOnie, Muscolino ([id number])] Franne Forts: [id number] Page 2 of 6] Assessed: [Left: No] [Right: No] [Left: Edema] [Right: :] Calf Left: Right: Point of Measurement: From Medial Instep 37.3 cm Ankle Left: Right: Point of Measurement: From Medial Instep 26 cm Vascular Assessment Pulses: Dorsalis Pedis Palpable: [Right:Yes] Electronic Signature(s) Signed: 02/21/2023 3:53:37 PM By: Samuella Bruin Entered By: Samuella Bruin on 02/21/2023 10:13:16 -------------------------------------------------------------------------------- Multi Wound Chart Details Patient Name: Date of Service: [name], [name] 02/21/2023 9:45 A M Medical Record Number: [id number] Patient Account Number: [id number] Date of Birth/Sex: Treating RN: 07-04-1956 (67 y.o. M) Primary Care Marcelle Bebout: Babs Sciara Other  Clinician: Referring Asaad Gulley: Treating Mayli Covington/Extender: Marchelle Folks, Scott A Weeks in Treatment: 15 Vital Signs Height(in): 74 Pulse(bpm): 91 Weight(lbs): 252 Blood Pressure(mmHg): 168/76 Body Mass Index(BMI): 32.4 Temperature(F): 97.7 Respiratory Rate(breaths/min): 16 [1:Photos:] [N/A:N/A] Right, Lateral Lower Leg N/A N/A Wound Location: Shear/Friction N/A N/A Wounding Event: Trauma, Other N/A N/A Primary Etiology: Congestive Heart Failure, N/A N/A Comorbid History: Hypertension, Peripheral Venous Disease, Type II Diabetes, Osteoarthritis, Neuropathy 09/16/2022 N/A N/A Date Acquired: 15 N/A N/A Weeks of Treatment: Open N/A N/A Wound Status: No N/A N/A Wound Recurrence: 2.5x0.5x0.1 N/A N/A Measurements L x  W x D (cm) 0.982 N/A N/A A (cm) : rea 0.098 N/A N/A Volume (cm) : 76.80% N/A N/A % Reduction in Area: 92.30% N/A N/A % Reduction in Volume: Full Thickness Without Exposed N/A N/A Classification: Support Structures Medium N/A N/A Exudate Amount: Serosanguineous N/A N/A Exudate Type: red, brown N/A N/A Exudate Color: Distinct, outline attached N/A N/A Wound Margin: Large (67-100%) N/A N/A Granulation Amount: Pink N/A N/A Granulation QualityJAVARUS, [name] ([id number]) [id number] Page 3 of 6 Small (1-33%) N/A N/A Necrotic Amount: Fat Layer (Subcutaneous Tissue): Yes N/A N/A Exposed Structures: Fascia: No Tendon: No Muscle: No Joint: No Bone: No Medium (34-66%) N/A N/A Epithelialization: Scarring: Yes N/A N/A Periwound Skin Texture: No Abnormalities Noted N/A N/A Periwound Skin Moisture: Hemosiderin Staining: Yes N/A N/A Periwound Skin Color: No Abnormality N/A N/A Temperature: Treatment Notes Electronic Signature(s) Signed: 02/21/2023 10:22:11 AM By: Duanne Guess MD FACS Entered By: Duanne Guess on 02/21/2023  10:22:11 -------------------------------------------------------------------------------- Multi-Disciplinary Care Plan Details Patient Name: Date of Service: [name] RE, [name] 02/21/2023 9:45 A M Medical Record Number: [id number] Patient Account Number: [id number] Date of Birth/Sex: Treating RN: [birth year] (67 y.o. Dianna Limbo Primary Care Manreet Kiernan: Babs Sciara Other Clinician: Referring Laronn Devonshire: Treating Josiel Gahm/Extender: Myriam Jacobson in Treatment: 15 Multidisciplinary Care Plan reviewed with physician Active Inactive Venous Leg Ulcer Nursing Diagnoses: Knowledge deficit related to disease process and management Potential for venous Insuffiency (use before diagnosis confirmed) Goals: Patient will maintain optimal edema control Date Initiated: 11/23/2022 Target Resolution Date: 05/16/2023 Goal Status: Active Interventions: Assess peripheral edema status every visit. Compression as ordered Treatment Activities: Therapeutic compression applied : 11/23/2022 Notes: Wound/Skin Impairment Nursing Diagnoses: Impaired tissue integrity Goals: Patient/caregiver will verbalize understanding of skin care regimen Date Initiated: 11/23/2022 Target Resolution Date: 05/16/2023 Goal Status: Active Ulcer/skin breakdown will have a volume reduction of 30% by week 4 Date Initiated: 11/07/2022 Target Resolution Date: 05/16/2023 Goal Status: Active Interventions: Assess patient/caregiver ability to obtain necessary supplies Assess patient/caregiver ability to perform ulcer/skin care regimen upon admission and as needed Assess ulceration(s) every visit Treatment Activities: Skin care regimen initiated : 11/07/2022 BRITTIN, CEASE ([id number]) [id number] Page 4 of 6 Topical wound management initiated : 11/07/2022 Notes: Electronic Signature(s) Signed: 02/21/2023 4:17:14 PM By: Karie Schwalbe RN Entered By: Karie Schwalbe on 02/21/2023  11:52:02 -------------------------------------------------------------------------------- Pain Assessment Details Patient Name: Date of Service: [name]. 02/21/2023 9:45 A M Medical Record Number: [id number] Patient Account Number: [id number] Date of Birth/Sex: Treating RN: Dec 13, 1955 (67 y.o. Marlan Palau Primary Care Marlenne Ridge: Babs Sciara Other Clinician: Referring Hau Sanor: Treating Ailen Strauch/Extender: Myriam Jacobson in Treatment: 15 Active Problems Location of Pain Severity and Description of Pain Patient Has Paino No Site Locations Rate the pain. Current Pain Level: 0 Pain Management and Medication Current Pain Management: Electronic Signature(s) Signed: 02/21/2023 3:53:37 PM By: Samuella Bruin Entered By: Samuella Bruin on 02/21/2023 10:11:03 -------------------------------------------------------------------------------- Patient/Caregiver Education Details Patient Name: Date of Service: [name] RE, [name]. 5/7/2024andnbsp9:45 A M Medical Record Number: [id number] Patient Account Number: [id number] Date of Birth/Gender: Treating RN: 09/23/1956 (67 y.o. Dianna Limbo Primary Care Physician: Babs Sciara Other Clinician: Referring Physician: Treating Physician/Extender: Myriam Jacobson in Treatment: 15 Education Assessment Education Provided To: Patient Education Topics Provided Wound/Skin Impairment: Methods: Explain/Verbal Responses: Return demonstration correctly ENOCH, CHEVALIER ([id number]) [id number] Page 5 of 6 Electronic Signature(s) Signed: 02/21/2023 4:17:14 PM By: Karie Schwalbe RN Entered By: Baruch Merl,  Randa Evens on 02/21/2023 11:52:17 -------------------------------------------------------------------------------- Wound Assessment Details Patient Name: Date of Service: [place] RE, [name] 02/21/2023 9:45 A M Medical Record Number: [id number] Patient Account  Number: [id number] Date of Birth/Sex: Treating RN: Jun 23, 1956 (67 y.o. Marlan Palau Primary Care Kanon Colunga: Babs Sciara Other Clinician: Referring Starsky Nanna: Treating Kaylon Laroche/Extender: Elmer Ramp Weeks in Treatment: 15 Wound Status Wound Number: 1 Primary Trauma, Other Etiology: Wound Location: Right, Lateral Lower Leg Wound Open Wounding Event: Shear/Friction Status: Date Acquired: 09/16/2022 Comorbid Congestive Heart Failure, Hypertension, Peripheral Venous Weeks Of Treatment: 15 History: Disease, Type II Diabetes, Osteoarthritis, Neuropathy Clustered Wound: No Photos Wound Measurements Length: (cm) 2.5 Width: (cm) 0.5 Depth: (cm) 0.1 Area: (cm) 0.982 Volume: (cm) 0.098 % Reduction in Area: 76.8% % Reduction in Volume: 92.3% Epithelialization: Medium (34-66%) Tunneling: No Undermining: No Wound Description Classification: Full Thickness Without Exposed Support Structures Wound Margin: Distinct, outline attached Exudate Amount: Medium Exudate Type: Serosanguineous Exudate Color: red, brown Foul Odor After Cleansing: No Slough/Fibrino Yes Wound Bed Granulation Amount: Large (67-100%) Exposed Structure Granulation Quality: Pink Fascia Exposed: No Necrotic Amount: Small (1-33%) Fat Layer (Subcutaneous Tissue) Exposed: Yes Necrotic Quality: Adherent Slough Tendon Exposed: No Muscle Exposed: No Joint Exposed: No Bone Exposed: No Periwound Skin Texture Texture Color No Abnormalities Noted: No No Abnormalities Noted: No Scarring: Yes Hemosiderin Staining: Yes Moisture Temperature / Pain No Abnormalities Noted: Yes Temperature: No Abnormality Treatment Notes MICAHI, CARUFEL ([id number]) [id number] Page 6 of 6 Wound #1 (Lower Leg) Wound Laterality: Right, Lateral Cleanser Soap and Water Discharge Instruction: May shower and wash wound with dial antibacterial soap and water prior to dressing  change. Wound Cleanser Discharge Instruction: Cleanse the wound with wound cleanser prior to applying a clean dressing using gauze sponges, not tissue or cotton balls. Peri-Wound Care Zinc Oxide Ointment 30g tube Discharge Instruction: Apply Zinc Oxide to periwound with each dressing change Sween Lotion (Moisturizing lotion) Discharge Instruction: Apply moisturizing lotion as directed Topical Primary Dressing Maxorb Extra Ag+ Alginate Dressing, 2x2 (in/in) Discharge Instruction: Apply to wound bed as instructed Secondary Dressing ABD Pad, 5x9 Discharge Instruction: Apply over primary dressing as directed. Woven Gauze Sponge, Non-Sterile 4x4 in Discharge Instruction: Apply over primary dressing as directed. Secured With Compression Wrap ThreePress (3 layer compression wrap) Discharge Instruction: Apply three layer compression as directed. Compression Stockings Add-Ons Electronic Signature(s) Signed: 02/21/2023 3:53:37 PM By: Samuella Bruin Entered By: Samuella Bruin on 02/21/2023 10:15:16 -------------------------------------------------------------------------------- Vitals Details Patient Name: Date of Service: [name] RE, [name] 02/21/2023 9:45 A M Medical Record Number: [id number] Patient Account Number: [id number] Date of Birth/Sex: Treating RN: Jan 31, 1956 (67 y.o. Marlan Palau Primary Care Pistol Kessenich: Babs Sciara Other Clinician: Referring Demarlo Riojas: Treating Tonna Palazzi/Extender: Myriam Jacobson in Treatment: 15 Vital Signs Time Taken: 10:10 Temperature (F): 97.7 Height (in): 74 Pulse (bpm): 91 Weight (lbs): 252 Respiratory Rate (breaths/min): 16 Body Mass Index (BMI): 32.4 Blood Pressure (mmHg): 168/76 Reference Range: 80 - 120 mg / dl Electronic Signature(s) Signed: 02/21/2023 3:53:37 PM By: Samuella Bruin Entered By: Samuella Bruin on 02/21/2023 10:10:57

## 2023-03-01 ENCOUNTER — Ambulatory Visit (HOSPITAL_BASED_OUTPATIENT_CLINIC_OR_DEPARTMENT_OTHER): Payer: Medicare HMO | Admitting: General Surgery

## 2023-03-04 ENCOUNTER — Other Ambulatory Visit: Payer: Medicare HMO

## 2023-03-06 ENCOUNTER — Ambulatory Visit: Payer: Medicare HMO | Admitting: Internal Medicine

## 2023-03-08 ENCOUNTER — Ambulatory Visit: Payer: Medicare HMO | Attending: Internal Medicine | Admitting: Internal Medicine

## 2023-03-09 ENCOUNTER — Encounter (HOSPITAL_BASED_OUTPATIENT_CLINIC_OR_DEPARTMENT_OTHER): Payer: Medicare HMO | Admitting: General Surgery

## 2023-03-09 DIAGNOSIS — E785 Hyperlipidemia, unspecified: Secondary | ICD-10-CM | POA: Diagnosis not present

## 2023-03-09 DIAGNOSIS — I5022 Chronic systolic (congestive) heart failure: Secondary | ICD-10-CM | POA: Diagnosis not present

## 2023-03-09 DIAGNOSIS — M1990 Unspecified osteoarthritis, unspecified site: Secondary | ICD-10-CM | POA: Diagnosis not present

## 2023-03-09 DIAGNOSIS — G894 Chronic pain syndrome: Secondary | ICD-10-CM | POA: Diagnosis not present

## 2023-03-09 DIAGNOSIS — E1142 Type 2 diabetes mellitus with diabetic polyneuropathy: Secondary | ICD-10-CM | POA: Diagnosis not present

## 2023-03-09 DIAGNOSIS — E11621 Type 2 diabetes mellitus with foot ulcer: Secondary | ICD-10-CM | POA: Diagnosis not present

## 2023-03-09 DIAGNOSIS — Z87891 Personal history of nicotine dependence: Secondary | ICD-10-CM | POA: Diagnosis not present

## 2023-03-09 DIAGNOSIS — I110 Hypertensive heart disease with heart failure: Secondary | ICD-10-CM | POA: Diagnosis not present

## 2023-03-09 DIAGNOSIS — Z89421 Acquired absence of other right toe(s): Secondary | ICD-10-CM | POA: Diagnosis not present

## 2023-03-09 DIAGNOSIS — I429 Cardiomyopathy, unspecified: Secondary | ICD-10-CM | POA: Diagnosis not present

## 2023-03-09 DIAGNOSIS — E1151 Type 2 diabetes mellitus with diabetic peripheral angiopathy without gangrene: Secondary | ICD-10-CM | POA: Diagnosis not present

## 2023-03-09 DIAGNOSIS — E11622 Type 2 diabetes mellitus with other skin ulcer: Secondary | ICD-10-CM | POA: Diagnosis not present

## 2023-03-09 DIAGNOSIS — L84 Corns and callosities: Secondary | ICD-10-CM | POA: Diagnosis not present

## 2023-03-09 DIAGNOSIS — L97819 Non-pressure chronic ulcer of other part of right lower leg with unspecified severity: Secondary | ICD-10-CM | POA: Diagnosis not present

## 2023-03-10 ENCOUNTER — Encounter: Payer: Self-pay | Admitting: Internal Medicine

## 2023-03-14 DIAGNOSIS — E782 Mixed hyperlipidemia: Secondary | ICD-10-CM | POA: Diagnosis not present

## 2023-03-14 DIAGNOSIS — Z125 Encounter for screening for malignant neoplasm of prostate: Secondary | ICD-10-CM | POA: Diagnosis not present

## 2023-03-14 DIAGNOSIS — E1169 Type 2 diabetes mellitus with other specified complication: Secondary | ICD-10-CM | POA: Diagnosis not present

## 2023-03-15 ENCOUNTER — Ambulatory Visit (INDEPENDENT_AMBULATORY_CARE_PROVIDER_SITE_OTHER): Payer: Medicare HMO | Admitting: Family Medicine

## 2023-03-15 VITALS — BP 128/69 | HR 70 | Ht 74.0 in | Wt 241.6 lb

## 2023-03-15 DIAGNOSIS — E1169 Type 2 diabetes mellitus with other specified complication: Secondary | ICD-10-CM | POA: Diagnosis not present

## 2023-03-15 DIAGNOSIS — E782 Mixed hyperlipidemia: Secondary | ICD-10-CM | POA: Diagnosis not present

## 2023-03-15 DIAGNOSIS — Z7984 Long term (current) use of oral hypoglycemic drugs: Secondary | ICD-10-CM

## 2023-03-15 DIAGNOSIS — I739 Peripheral vascular disease, unspecified: Secondary | ICD-10-CM

## 2023-03-15 DIAGNOSIS — Z89419 Acquired absence of unspecified great toe: Secondary | ICD-10-CM

## 2023-03-15 DIAGNOSIS — G8929 Other chronic pain: Secondary | ICD-10-CM | POA: Diagnosis not present

## 2023-03-15 DIAGNOSIS — F09 Unspecified mental disorder due to known physiological condition: Secondary | ICD-10-CM | POA: Diagnosis not present

## 2023-03-15 DIAGNOSIS — R413 Other amnesia: Secondary | ICD-10-CM | POA: Diagnosis not present

## 2023-03-15 LAB — BASIC METABOLIC PANEL
BUN: 19 mg/dL (ref 7–25)
CO2: 27 mmol/L (ref 20–32)
Calcium: 8.9 mg/dL (ref 8.6–10.3)
Chloride: 105 mmol/L (ref 98–110)
Creat: 1.27 mg/dL (ref 0.70–1.35)
Glucose, Bld: 195 mg/dL — ABNORMAL HIGH (ref 65–139)
Potassium: 4.6 mmol/L (ref 3.5–5.3)
Sodium: 140 mmol/L (ref 135–146)

## 2023-03-15 LAB — PSA: PSA: 0.75 ng/mL (ref <=4.00)

## 2023-03-15 LAB — HEMOGLOBIN A1C
Hgb A1c MFr Bld: 8 % of total Hgb — ABNORMAL HIGH (ref <5.7)
Mean Plasma Glucose: 183 mg/dL
eAG (mmol/L): 10.1 mmol/L

## 2023-03-15 MED ORDER — OXYCODONE-ACETAMINOPHEN 10-325 MG PO TABS: ORAL_TABLET | ORAL | 0 refills | Status: DC

## 2023-03-15 MED ORDER — BETAMETHASONE VALERATE 0.1 % EX OINT: 1.0000 | TOPICAL_OINTMENT | Freq: Two times a day (BID) | CUTANEOUS | 5 refills | Status: DC | PRN

## 2023-03-15 MED ORDER — MEMANTINE HCL 5 MG PO TABS: 5.0000 mg | ORAL_TABLET | Freq: Two times a day (BID) | ORAL | 4 refills | Status: DC

## 2023-03-15 NOTE — Progress Notes (Signed)
Subjective:    Patient ID: [name], male    DOB: Nov 03, 1955, 67 y.o.   MRN: [id number]  HPI Patient needs refills on ointments.   This patient was seen today for chronic pain  The medication list was reviewed and updated.   Location of Pain for which the patient has been treated with regarding narcotics: Patient with previous neck surgery has chronic neck pain he also has had a laminotomy in the spine with intermittent low back pain and sciatica plus also painful peripheral neuropathy from diabetes  Onset of this pain: Present for years   -Compliance with medication: Good compliance  - Number patient states they take daily: Takes approximately 4/day  -Reason for ongoing use of opioids please see discussion above  What other measures have been tried outside of opioids he has had surgeries, NSAIDs, Tylenol, has seen specialist as well  In the ongoing specialists regarding this condition intermittently has had to see back specialist, wound care center, endocrinology  -when was the last dose patient took? 0700 this morning  The patient was advised the importance of maintaining medication and not using illegal substances with these.  Here for refills and follow up  The patient was educated that we can provide 3 monthly scripts for their medication, it is their responsibility to follow the instructions.  Side effects or complications from medications: Denies side effects with medicine  Patient is aware that pain medications are meant to minimize the severity of the pain to allow their pain levels to improve to allow for better function. They are aware of that pain medications cannot totally remove their pain.  Due for UDT ( at least once per year) (pain management contract is also completed at the time of the UDT): 07/15/2022  Scale of 1 to 10 ( 1 is least 10 is most) Your pain level without the medicine: 8 Your pain level with medication 5  Scale 1 to 10 ( 1-helps very  little, 10 helps very well) How well does your pain medication reduce your pain so you can function better through out the day? 7  Quality of the pain: Throbbing aching  Persistence of the pain: Present all the time  Modifying factors: Worse with activity  His wife relates that he has poor short-term memory.  Often forgets things has to be redirected frequently has not done anything dangerous has had a couple times where he is trying to take things apart to put it back together      Review of Systems     Objective:   Physical Exam General-in no acute distress Eyes-no discharge Lungs-respiratory rate normal, CTA CV-no murmurs,RRR Extremities skin warm dry no edema Neuro grossly normal Behavior normal, alert        Assessment & Plan:  1. Encounter for chronic pain management The patient was seen in followup for chronic pain. A review over at their current pain status was discussed. Drug registry was checked. Prescriptions were given.  Regular follow-up recommended. Discussion was held regarding the importance of compliance with medication as well as pain medication contract.  Patient was informed that medication may cause drowsiness and should not be combined  with other medications/alcohol or street drugs. If the patient feels medication is causing altered alertness then do not drive or operate dangerous equipment.  Should be noted that the patient appears to be meeting appropriate use of opioids and response.  Evidenced by improved function and decent pain control without significant side effects and  no evidence of overt aberrancy issues.  Upon discussion with the patient today they understand that opioid therapy is optional and they feel that the pain has been refractory to reasonable conservative measures and is significant and affecting quality of life enough to warrant ongoing therapy and wishes to continue opioids.  Refills were provided.   2. History of amputation of  great toe [facility]) He has been under the care of wound care for leg infection along with ulcerative area but this is healed up now diabetic foot exam today he would benefit from seeing podiatry and having diabetic shoes he states he will get in with Dr. Nolen Mu  3. DM type 2 with diabetic mixed hyperlipidemia (HCC) A1c tends to rise in hide not watching diet as well as he should we did discuss Ozempic because of cost he does not want to do this He is open to continuous glucose monitor if given some help We will also do chronic care management referral to clinical pharmacist for their assistance with continuous glucose monitoring as well as trying to fine-tune the approach on his diabetes  4. Peripheral vascular disease (HCC) Patient at risk for future ulcers we did discuss warning signs what to watch for  5. Cognitive dysfunction Because of short-term memory issues and some difficulty with figuring out processes such as when he takes a part in the engine sometimes he cannot put it back together-he was advised not to do any driving let his wife do the driving.  6. Short-term memory loss I would not characterize this as Alzheimer's just yet but he is on the path toward this although it is multifactorial given his previous stroke and vascular issues.  It is reasonable to try Namenda 5 mg twice daily  Follow-up 3 months

## 2023-04-08 ENCOUNTER — Other Ambulatory Visit: Payer: Self-pay | Admitting: Family Medicine

## 2023-04-17 NOTE — Progress Notes (Signed)
LEAL, PASCALL ([id number]) [id number] Page 1 of 8 Visit Report for 03/09/2023 Chief Complaint Document Details Patient Name: Date of Service: [name] 03/09/2023 11:15 A M Medical Record Number: [id number] Patient Account Number: [id number] Date of Birth/Sex: Treating RN: [birth year] (67 y.o. M) Primary Care Provider: Babs Sciara Other Clinician: Referring Provider: Treating Provider/Extender: Myriam Jacobson in Treatment: 17 Information Obtained from: Patient Chief Complaint Patients presents for treatment of an open diabetic ulcer on his right foot, as well as ulcers on his right lower leg and ankle Electronic Signature(s) Signed: 03/09/2023 11:58:42 AM By: Duanne Guess MD FACS Entered By: Duanne Guess on 03/09/2023 11:58:41 -------------------------------------------------------------------------------- HPI Details Patient Name: Date of Service: [name] RE, [name] 03/09/2023 11:15 A M Medical Record Number: [id number] Patient Account Number: [id number] Date of Birth/Sex: Treating RN: 01/17/1956 (67 y.o. M) Primary Care Provider: Babs Sciara Other Clinician: Referring Provider: Treating Provider/Extender: Myriam Jacobson in Treatment: 17 History of Present Illness HPI Description: ADMISSION 11/07/2022 This is a 67 year old man with a past medical history significant for type 2 diabetes (last hemoglobin A1c 7.1), cerebrovascular occlusive disease, coronary artery disease, peripheral vascular disease, congestive heart failure, chronic pain syndrome, and prior amputation of his right great and second toes. He has a remote history of surgery on his right ankle and apparently as his legs have become increasingly more swollen and he has insisted upon wearing boots, the pressure and friction seem to have opened up wounds along the old scar line. In addition, he has a large callus on the third  metatarsal head, related to his acquired foot deformity. He sees a podiatrist who periodically shaves this callus down and he was also recommended orthotics, but the patient has refused them because he likes his boots. There is an area of discoloration in the lower portion of this callus, suspicious for an open wound. 11/14/2022: The ulcer on his third metatarsal head has closed. The lateral leg ulcers are both about the same size, but he says that they feel better. There is slough accumulation in both sites. 11/23/2022: The ulcer on his foot remains closed. The lateral leg ulcers are still unchanged in size, but are a bit cleaner, just with some slough accumulation. 12/01/2022: No significant change to the size of the wounds on his lateral leg. The larger is quite clean with good granulation tissue present. The smaller still has fairly thick slough buildup. 12/09/2022: Both wounds are smaller today. They still have slough accumulation, but less so than at his previous visit. The callus on his foot has built up again and is uncomfortable. 12/16/2022: The wound at his ankle is nearly closed with just a little slough on the remaining open surface. The lateral leg wound is about the same size but has less slough accumulation. We are still working on Therapist, occupational for wound VAC. 12/23/2022: The wound is ankle is down to just a pinhole with a small amount of slough and eschar present. The lateral leg wound has contracted in length and is also shallower. There is slough on the surface. We still have not secured insurance approval for his wound VAC. 01/05/2023: The ankle wound is healed underneath a layer of eschar. The lateral leg wound is smaller and has some slough accumulation. He was finally approved for a wound VAC and has it with him today. 01/13/2023: The wound depth has come in considerably and the overall wound size has contracted as well.  There is just a little slough on the surface. He is having an  excellent response to negative pressure wound therapy. [name] ([id number]) [id number] Page 2 of 8 01/19/2023: The wound is smaller and shallower again today. There is some slough accumulation on the surface. 01/27/2023: The wound is smaller again today and is essentially flush with the surrounding skin. The wound on his second metatarsal head has reopened under a thick layer of callus. 02/06/2023: The foot wound has healed. The lateral leg wound is smaller today with some slough accumulation. 02/14/2023: The lateral leg wound is considerably narrower today. There is a little bit of slough buildup. He says the callus on his second metatarsal head has built up to the point that it is bothering him again. 02/21/2023: His wound is smaller again today. There is epithelium encroaching in from all sides. There is a little bit of eschar at the proximal aspect of the wound with light slough in a couple of pockets on the surface. Edema control is good. 03/09/2023: His wound is healed. Electronic Signature(s) Signed: 03/09/2023 11:59:00 AM By: Duanne Guess MD FACS Entered By: Duanne Guess on 03/09/2023 11:59:00 -------------------------------------------------------------------------------- Physical Exam Details Patient Name: Date of Service: [name] RE, [name] 03/09/2023 11:15 A M Medical Record Number: [id number] Patient Account Number: [id number] Date of Birth/Sex: Treating RN: Dec 22, 1955 (67 y.o. M) Primary Care Provider: Babs Sciara Other Clinician: Referring Provider: Treating Provider/Extender: Marchelle Folks, Scott A Weeks in Treatment: 17 Constitutional Slightly hypertensive. . . . no acute distress. Respiratory Normal work of breathing on room air. Notes 03/09/2023: His wound is healed. Electronic Signature(s) Signed: 03/09/2023 11:59:39 AM By: Duanne Guess MD FACS Entered By: Duanne Guess on 03/09/2023  11:59:38 -------------------------------------------------------------------------------- Physician Orders Details Patient Name: Date of Service: [name] RE, [name] 03/09/2023 11:15 A M Medical Record Number: [id number] Patient Account Number: [id number] Date of Birth/Sex: Treating RN: [birth year] (67 y.o. Yates Decamp Primary Care Provider: Lilyan Punt A Other Clinician: Referring Provider: Treating Provider/Extender: Myriam Jacobson in Treatment: 17 Verbal / Phone Orders: No Diagnosis Coding ICD-10 Coding Code Description L97.819 Non-pressure chronic ulcer of other part of right lower leg with unspecified severity E11.622 Type 2 diabetes mellitus with other skin ulcer E11.621 Type 2 diabetes mellitus with foot ulcer RANSOME, BENEDIX ([id number]) [phone].pdf Page 3 of 8 I50.22 Chronic systolic (congestive) heart failure I73.9 Peripheral vascular disease, unspecified E11.42 Type 2 diabetes mellitus with diabetic polyneuropathy I63.9 Cerebral infarction, unspecified I10 Essential (primary) hypertension G89.4 Chronic pain syndrome L84 Corns and callosities [facility]charge [facility] Discharge from Wound Care Center - Please call us if you need [place]. Congratulations on healing! Bathing/ Shower/ Hygiene May shower and wash wound with soap and water. Edema Control - Lymphedema / SCD / Other Right Lower Extremity Elevate legs to the level of the heart or above for 30 minutes daily and/or when sitting for 3-4 times a day throughout the day. Avoid standing for long periods of time. Exercise regularly Off-Loading Other: - Wear prescribed foot wear (Orthotics). Do not wear the boots. Wound Treatment Electronic Signature(s) Signed: 03/09/2023 12:15:16 PM By: Duanne Guess MD FACS Entered By: Duanne Guess on 03/09/2023 11:59:48 -------------------------------------------------------------------------------- Problem List  Details Patient Name: Date of Service: [name], [name] 03/09/2023 11:15 A M Medical Record Number: [id number] Patient Account Number: [id number] Date of Birth/Sex: Treating RN: 09-May-1956 (67 y.o. Yates Decamp Primary Care Provider: Babs Sciara Other Clinician: Referring Provider: Treating  Provider/Extender: Marchelle Folks, Scott A Weeks in Treatment: 17 Active Problems ICD-10 Encounter Code Description Active Date MDM Diagnosis L97.819 Non-pressure chronic ulcer of other part of right lower leg with unspecified 11/07/2022 No Yes severity E11.622 Type 2 diabetes mellitus with other skin ulcer 11/07/2022 No Yes E11.621 Type 2 diabetes mellitus with foot ulcer 11/07/2022 No Yes I50.22 Chronic systolic (congestive) heart failure 11/07/2022 No Yes I73.9 Peripheral vascular disease, unspecified 11/07/2022 No Yes E11.42 Type 2 diabetes mellitus with diabetic polyneuropathy 11/07/2022 No Yes RONDAL, GRICE ([id number]) [phone] Page 4 of 8 I63.9 Cerebral infarction, unspecified 11/07/2022 No Yes I10 Essential (primary) hypertension 11/07/2022 No Yes G89.4 Chronic pain syndrome 11/07/2022 No Yes L84 Corns and callosities 12/09/2022 No Yes Inactive Problems ICD-10 Code Description Active Date Inactive Date L97.512 Non-pressure chronic ulcer of other part of right foot with fat layer exposed 11/07/2022 11/07/2022 Resolved Problems ICD-10 Code Description Active Date Resolved Date L97.312 Non-pressure chronic ulcer of right ankle with fat layer exposed 11/07/2022 11/07/2022 Electronic Signature(s) Signed: 03/09/2023 11:58:25 AM By: Duanne Guess MD FACS Entered By: Duanne Guess on 03/09/2023 11:58:24 -------------------------------------------------------------------------------- Progress Note Details Patient Name: Date of Service: [name] RE, [name] 03/09/2023 11:15 A M Medical Record Number: [id number] Patient Account Number: [id number] Date  of Birth/Sex: Treating RN: [birth year] (67 y.o. M) Primary Care Provider: Babs Sciara Other Clinician: Referring Provider: Treating Provider/Extender: Myriam Jacobson in Treatment: 17 Subjective Chief Complaint Information obtained from Patient Patients presents for treatment of an open diabetic ulcer on his right foot, as well as ulcers on his right lower leg and ankle History of Present Illness (HPI) ADMISSION 11/07/2022 This is a 67 year old man with a past medical history significant for type 2 diabetes (last hemoglobin A1c 7.1), cerebrovascular occlusive disease, coronary artery disease, peripheral vascular disease, congestive heart failure, chronic pain syndrome, and prior amputation of his right great and second toes. He has a remote history of surgery on his right ankle and apparently as his legs have become increasingly more swollen and he has insisted upon wearing boots, the pressure and friction seem to have opened up wounds along the old scar line. In addition, he has a large callus on the third metatarsal head, related to his acquired foot deformity. He sees a podiatrist who periodically shaves this callus down and he was also recommended orthotics, but the patient has refused them because he likes his boots. There is an area of discoloration in the lower portion of this callus, suspicious for an open wound. 11/14/2022: The ulcer on his third metatarsal head has closed. The lateral leg ulcers are both about the same size, but he says that they feel better. There is slough accumulation in both sites. 11/23/2022: The ulcer on his foot remains closed. The lateral leg ulcers are still unchanged in size, but are a bit cleaner, just with some slough accumulation. YACINE, WESTENHAVER ([id number]) [id number] Page 5 of 8 12/01/2022: No significant change to the size of the wounds on his lateral leg. The larger is quite clean with good  granulation tissue present. The smaller still has fairly thick slough buildup. 12/09/2022: Both wounds are smaller today. They still have slough accumulation, but less so than at his previous visit. The callus on his foot has built up again and is uncomfortable. 12/16/2022: The wound at his ankle is nearly closed with just a little slough on the remaining open surface. The lateral leg wound is about the same size but has less  slough accumulation. We are still working on Therapist, occupational for wound VAC. 12/23/2022: The wound is ankle is down to just a pinhole with a small amount of slough and eschar present. The lateral leg wound has contracted in length and is also shallower. There is slough on the surface. We still have not secured insurance approval for his wound VAC. 01/05/2023: The ankle wound is healed underneath a layer of eschar. The lateral leg wound is smaller and has some slough accumulation. He was finally approved for a wound VAC and has it with him today. 01/13/2023: The wound depth has come in considerably and the overall wound size has contracted as well. There is just a little slough on the surface. He is having an excellent response to negative pressure wound therapy. 01/19/2023: The wound is smaller and shallower again today. There is some slough accumulation on the surface. 01/27/2023: The wound is smaller again today and is essentially flush with the surrounding skin. The wound on his second metatarsal head has reopened under a thick layer of callus. 02/06/2023: The foot wound has healed. The lateral leg wound is smaller today with some slough accumulation. 02/14/2023: The lateral leg wound is considerably narrower today. There is a little bit of slough buildup. He says the callus on his second metatarsal head has built up to the point that it is bothering him again. 02/21/2023: His wound is smaller again today. There is epithelium encroaching in from all sides. There is a little bit of  eschar at the proximal aspect of the wound with light slough in a couple of pockets on the surface. Edema control is good. 03/09/2023: His wound is healed. Patient History Family History Cancer - Father,Mother,Siblings, Diabetes - Mother,Siblings, Heart Disease, Lung Disease - Father, No family history of Hereditary Spherocytosis, Hypertension, Kidney Disease, Seizures, Stroke, Thyroid Problems, Tuberculosis. Social History Former smoker - ended on 12/23/1970, Marital Status - Married, Alcohol Use - Never, Drug Use - No History, Caffeine Use - Daily. Medical History Eyes Denies history of Cataracts, Glaucoma, Optic Neuritis Ear/Nose/Mouth/Throat Denies history of Chronic sinus problems/congestion, Middle ear problems Respiratory Denies history of Aspiration, Asthma, Chronic Obstructive Pulmonary Disease (COPD), Pneumothorax, Sleep Apnea, Tuberculosis Cardiovascular Patient has history of Congestive Heart Failure, Hypertension, Peripheral Venous Disease Endocrine Patient has history of Type II Diabetes Musculoskeletal Patient has history of Osteoarthritis Neurologic Patient has history of Neuropathy Oncologic Denies history of Received Chemotherapy, Received Radiation Psychiatric Denies history of Anorexia/bulimia, Confinement Anxiety Medical A Surgical History Notes nd Cardiovascular cardiomyopathy hyperlipidemia hypokalemia stroke Genitourinary orchitis Objective Constitutional Slightly hypertensive. no acute distress. Vitals Time Taken: 11:23 AM, Height: 74 in, Weight: 252 lbs, BMI: 32.4, Temperature: 98.1 F, Pulse: 83 bpm, Respiratory Rate: 20 breaths/min, Blood Pressure: 145/74 mmHg. Respiratory Normal work of breathing on room air. DAQWON, BURKETTE ([id number]) [id number] Page 6 of 8 General Notes: 03/09/2023: His wound is healed. Integumentary (Hair, Skin) Wound #1 status is Open. Original cause of wound was Shear/Friction. The date acquired  was: 09/16/2022. The wound has been in treatment 17 weeks. The wound is located on the Right,Lateral Lower Leg. The wound measures 0cm length x 0cm width x 0cm depth; 0cm^2 area and 0cm^3 volume. There is Fat Layer (Subcutaneous Tissue) exposed. There is a medium amount of serosanguineous drainage noted. The wound margin is distinct with the outline attached to the wound base. There is large (67-100%) pink granulation within the wound bed. There is a small (1-33%) amount of necrotic tissue within the wound bed. The  periwound skin appearance had no abnormalities noted for moisture. The periwound skin appearance exhibited: Scarring, Hemosiderin Staining. Periwound temperature was noted as No Abnormality. Assessment Active Problems ICD-10 Non-pressure chronic ulcer of other part of right lower leg with unspecified severity Type 2 diabetes mellitus with other skin ulcer Type 2 diabetes mellitus with foot ulcer Chronic systolic (congestive) heart failure Peripheral vascular disease, unspecified Type 2 diabetes mellitus with diabetic polyneuropathy Cerebral infarction, unspecified Essential (primary) hypertension Chronic pain syndrome Corns and callosities Plan Discharge From [facility] Services: Discharge from Wound Care Center - Please call us if you need [place]. Congratulations on healing! Bathing/ Shower/ Hygiene: May shower and wash wound with soap and water. Edema Control - Lymphedema / SCD / Other: Elevate legs to the level of the heart or above for 30 minutes daily and/or when sitting for 3-4 times a day throughout the day. Avoid standing for long periods of time. Exercise regularly Off-Loading: Other: - Wear prescribed foot wear (Orthotics). Do not wear the boots. 03/09/2023: His wound is healed. As this wound opened as a result of pressure and friction from a pair of boots, I recommended that he not wear that pair of boots again in the future. The boots he was wearing came to just about the  height of where his wound developed. I suggested he either get low ankle boots or full calf height boots. He said he would go shopping. We will discharge him from the wound care center and he may follow-up as needed. Electronic Signature(s) Signed: 03/16/2023 9:31:56 AM By: Shawn Stall RN, BSN Signed: 04/17/2023 2:55:18 PM By: Duanne Guess MD FACS Previous Signature: 03/09/2023 12:02:47 PM Version By: Duanne Guess MD FACS Entered By: Shawn Stall on 03/16/2023 09:30:54 -------------------------------------------------------------------------------- HxROS Details Patient Name: Date of Service: [name] RE, [name] 03/09/2023 11:15 A M Medical Record Number: [id number] Patient Account Number: [id number] Date of Birth/Sex: Treating RN: 06/20/1956 (67 y.o. M) Primary Care Provider: Babs Sciara Other Clinician: Referring Provider: Treating Provider/Extender: Myriam Jacobson in Treatment: 17 Eyes Medical History: Negative for: Cataracts; Glaucoma; Optic Neuritis KAMIL, NOLAZCO ([id number]) [id number] Page 7 of 8 Ear/Nose/Mouth/Throat Medical History: Negative for: Chronic sinus problems/congestion; Middle ear problems Respiratory Medical History: Negative for: Aspiration; Asthma; Chronic Obstructive Pulmonary Disease (COPD); Pneumothorax; Sleep Apnea; Tuberculosis Cardiovascular Medical History: Positive for: Congestive Heart Failure; Hypertension; Peripheral Venous Disease Past Medical History Notes: cardiomyopathy hyperlipidemia hypokalemia stroke Endocrine Medical History: Positive for: Type II Diabetes Time with diabetes: 20 years Treated with: Insulin, Oral agents Genitourinary Medical History: Past Medical History Notes: orchitis Musculoskeletal Medical History: Positive for: Osteoarthritis Neurologic Medical History: Positive for: Neuropathy Oncologic Medical History: Negative for: Received Chemotherapy;  Received Radiation Psychiatric Medical History: Negative for: Anorexia/bulimia; Confinement Anxiety Immunizations Pneumococcal Vaccine: Received Pneumococcal Vaccination: Yes Received Pneumococcal Vaccination On or After 60th Birthday: Yes Implantable Devices No devices added Family and Social History Cancer: Yes - Father,Mother,Siblings; Diabetes: Yes - Mother,Siblings; Heart Disease: Yes; Hereditary Spherocytosis: No; Hypertension: No; Kidney Disease: No; Lung Disease: Yes - Father; Seizures: No; Stroke: No; Thyroid Problems: No; Tuberculosis: No; Former smoker - ended on 12/23/1970; Marital Status - Married; Alcohol Use: Never; Drug Use: No History; Caffeine Use: Daily; Financial Concerns: No; Food, Clothing or Shelter Needs: No; Support System Lacking: No; Transportation Concerns: No Electronic Signature(s) Signed: 03/09/2023 12:15:16 PM By: Duanne Guess MD FACS Entered By: Duanne Guess on 03/09/2023 11:59:06 Benson [place] ([id number]) [phone].pdf Page 8 of 8 -------------------------------------------------------------------------------- SuperBill Details Patient Name: Date of  Service: [place] RE, [name] 03/09/2023 Medical Record Number: [id number] Patient Account Number: [id number] Date of Birth/Sex: Treating RN: 07/24/1956 (67 y.o. Yates Decamp Primary Care Provider: Babs Sciara Other Clinician: Referring Provider: Treating Provider/Extender: Myriam Jacobson in Treatment: 17 Diagnosis Coding ICD-10 Codes Code Description [phone] Non-pressure chronic ulcer of other part of right lower leg with unspecified severity E11.622 Type 2 diabetes mellitus with other skin ulcer E11.621 Type 2 diabetes mellitus with foot ulcer I50.22 Chronic systolic (congestive) heart failure I73.9 Peripheral vascular disease, unspecified E11.42 Type 2 diabetes mellitus with diabetic polyneuropathy I63.9 Cerebral infarction,  unspecified I10 Essential (primary) hypertension G89.4 Chronic pain syndrome L84 Corns and callosities Facility Procedures : CPT4 Code: [id number] Description: 99213 - WOUND CARE VISIT-LEV 3 EST PT Modifier: 25 Quantity: 1 Physician Procedures : CPT4 Code Description Modifier [id number] - WC PHYS LEVEL 3 - EST PT ICD-10 Diagnosis Description L97.819 Non-pressure chronic ulcer of other part of right lower leg with unspecified severity E11.622 Type 2 diabetes mellitus with other skin ulcer  I50.22 Chronic systolic (congestive) heart failure I73.9 Peripheral vascular disease, unspecified Quantity: 1 Electronic Signature(s) Signed: 03/16/2023 9:31:56 AM By: Shawn Stall RN, BSN Signed: 04/17/2023 2:55:18 PM By: Duanne Guess MD FACS Previous Signature: 03/09/2023 12:03:05 PM Version By: Duanne Guess MD FACS Entered By: Shawn Stall on 03/16/2023 09:31:05

## 2023-04-28 ENCOUNTER — Other Ambulatory Visit: Payer: Self-pay | Admitting: Family Medicine

## 2023-05-02 NOTE — Telephone Encounter (Signed)
Please confirm with family that they are on this potassium on a regular basis if so may have 90-day with 1 refill

## 2023-05-05 ENCOUNTER — Telehealth: Payer: Self-pay

## 2023-05-05 NOTE — Telephone Encounter (Signed)
Nurses-please find out from his insurance company or pharmacy what insulins are covered then let me know then I can prescribe thanks

## 2023-05-05 NOTE — Telephone Encounter (Signed)
Insulin is not covered that pt was taking and new RX needs to be sent   [facility] [phone]

## 2023-05-06 ENCOUNTER — Other Ambulatory Visit: Payer: Self-pay | Admitting: Family Medicine

## 2023-05-06 DIAGNOSIS — E1169 Type 2 diabetes mellitus with other specified complication: Secondary | ICD-10-CM

## 2023-05-08 NOTE — Telephone Encounter (Signed)
Insurance cover [name] (brand name and generic)

## 2023-05-09 ENCOUNTER — Other Ambulatory Visit: Payer: Self-pay | Admitting: Family Medicine

## 2023-05-09 ENCOUNTER — Telehealth: Payer: Self-pay

## 2023-05-09 DIAGNOSIS — E1169 Type 2 diabetes mellitus with other specified complication: Secondary | ICD-10-CM

## 2023-05-09 NOTE — Telephone Encounter (Signed)
Pt is calling   insulin glargine (LANTUS SOLOSTAR) 100 UNIT/ML Solostar Pen  Is no longer covered by insurance and needs new RX for a different med   [place] [phone]

## 2023-05-09 NOTE — Telephone Encounter (Signed)
See other message

## 2023-05-09 NOTE — Telephone Encounter (Signed)
Patient notified

## 2023-05-09 NOTE — Telephone Encounter (Signed)
May switch directly to [place] it is a one-to-one conversion  Please confirm with him how many units of Lantus he is using then may order the same with [place] 1 month with 5 refills

## 2023-05-09 NOTE — Telephone Encounter (Signed)
Duplicate- see previous message - Insurance will cover [place] or generic Evaristo Bury

## 2023-05-09 NOTE — Telephone Encounter (Signed)
Patient currently doing 54 units- may titrate up to 60 units Prescription called into pharmacy

## 2023-05-15 ENCOUNTER — Telehealth: Payer: Self-pay

## 2023-05-16 NOTE — Telephone Encounter (Signed)
Error

## 2023-05-21 ENCOUNTER — Telehealth: Payer: Self-pay | Admitting: Family Medicine

## 2023-05-21 NOTE — Telephone Encounter (Signed)
Plz touch base with his wife  Have them bring all meds to ov in mid August thanks

## 2023-05-31 ENCOUNTER — Telehealth: Payer: Self-pay

## 2023-05-31 ENCOUNTER — Ambulatory Visit (INDEPENDENT_AMBULATORY_CARE_PROVIDER_SITE_OTHER): Payer: Medicare HMO | Admitting: Family Medicine

## 2023-05-31 ENCOUNTER — Encounter: Payer: Self-pay | Admitting: Family Medicine

## 2023-05-31 VITALS — BP 130/70 | HR 73 | Temp 98.2°F | Ht 74.0 in | Wt 243.0 lb

## 2023-05-31 DIAGNOSIS — G8929 Other chronic pain: Secondary | ICD-10-CM

## 2023-05-31 DIAGNOSIS — Z89419 Acquired absence of unspecified great toe: Secondary | ICD-10-CM

## 2023-05-31 DIAGNOSIS — R413 Other amnesia: Secondary | ICD-10-CM

## 2023-05-31 DIAGNOSIS — E1142 Type 2 diabetes mellitus with diabetic polyneuropathy: Secondary | ICD-10-CM | POA: Diagnosis not present

## 2023-05-31 DIAGNOSIS — Z125 Encounter for screening for malignant neoplasm of prostate: Secondary | ICD-10-CM

## 2023-05-31 DIAGNOSIS — Z79891 Long term (current) use of opiate analgesic: Secondary | ICD-10-CM

## 2023-05-31 DIAGNOSIS — E782 Mixed hyperlipidemia: Secondary | ICD-10-CM | POA: Diagnosis not present

## 2023-05-31 DIAGNOSIS — Z79899 Other long term (current) drug therapy: Secondary | ICD-10-CM

## 2023-05-31 DIAGNOSIS — E1169 Type 2 diabetes mellitus with other specified complication: Secondary | ICD-10-CM

## 2023-05-31 DIAGNOSIS — I739 Peripheral vascular disease, unspecified: Secondary | ICD-10-CM

## 2023-05-31 MED ORDER — OXYCODONE-ACETAMINOPHEN 10-325 MG PO TABS: ORAL_TABLET | ORAL | 0 refills | Status: DC

## 2023-05-31 NOTE — Progress Notes (Signed)
Should  Subjective:    Patient ID: [name], male    DOB: 1955/11/25, 67 y.o.   MRN: [id number]  HPI Patient follow up - concerned with right leg swelling and right foot pain . He is seeing foot doctor and wound care regularly but wife states has missed some appts  Pain management follow up - last dose this morning will do UDT today Nice patient Challenging situation Has diabetes, dementia issues, partial amputation of the right foot, preulcerative callus, underlying heart disease  Patient has difficult time remembering and focusing and staying on task has frequent forgetful spells does not drive anymore works around his house family make sure that he takes his medicines  Patient has severe pain in his knees as well as his neck he has had previous neck surgery.  Cannot take NSAIDs because of his underlying health issues Tylenol alone does not help with the discomfort has been on pain medicine consistently for many years is compliant with the medicine.  Follows recommendations.  Urine drug screen on a yearly basis.  Has PDMP evaluation every 3 months with office visit  This patient was seen today for chronic pain  The medication list was reviewed and updated.   Location of Pain for which the patient has been treated with regarding narcotics: Neck and knees  Onset of this pain: Present for years   -Compliance with medication: Good compliance with medicine  - Number patient states they take daily: 3 or 4 every day  -Reason for ongoing use of opioids cervical disc disease as well as bilateral osteoarthritis issues  What other measures have been tried outside of opioids Tylenol, surgery, injections  In the ongoing specialists regarding this condition none currently  -when was the last dose patient took?  Yesterday  The patient was advised the importance of maintaining medication and not using illegal substances with these.  Here for refills and follow up  The patient was  educated that we can provide 3 monthly scripts for their medication, it is their responsibility to follow the instructions.  Side effects or complications from medications: Denies side effects  Patient is aware that pain medications are meant to minimize the severity of the pain to allow their pain levels to improve to allow for better function. They are aware of that pain medications cannot totally remove their pain.  Due for UDT ( at least once per year) (pain management contract is also completed at the time of the UDT): Due today  Scale of 1 to 10 ( 1 is least 10 is most) Your pain level without the medicine: 8 Your pain level with medication 5  Scale 1 to 10 ( 1-helps very little, 10 helps very well) How well does your pain medication reduce your pain so you can function better through out the day?  7  Quality of the pain: Throbbing aching  Persistence of the pain: Present all the time  Modifying factors: Worse with activity  Encounter for chronic pain management - Plan: ToxASSURE Select 13 (MW), Urine  Peripheral vascular disease (HCC) - Plan: US ARTERIAL ABI (SCREENING LOWER EXTREMITY)  History of amputation of great toe (HCC) - Plan: US ARTERIAL ABI (SCREENING LOWER EXTREMITY)  DM type 2 with diabetic mixed hyperlipidemia (HCC) - Plan: Hemoglobin A1c, Basic Metabolic Panel, Microalbumin/Creatinine Ratio, Urine, US ARTERIAL ABI (SCREENING LOWER EXTREMITY), AMB Referral to Pharmacy Medication Management  Short-term memory loss  Diabetic peripheral neuropathy (HCC) - Plan: AMB Referral to Pharmacy Medication Management  Screening PSA (prostate specific antigen) - Plan: PSA  Mixed hyperlipidemia - Plan: Lipid Panel  High risk medication use - Plan: Hepatic Function Panel      Review of Systems     Objective:   Physical Exam  General-in no acute distress Eyes-no discharge Lungs-respiratory rate normal, CTA CV-no murmurs,RRR Extremities skin warm dry no  edema Neuro grossly normal Behavior normal, alert Diabetic foot exam Has preulcerative callus partial amputation of the foot      Assessment & Plan:  1. Encounter for chronic pain management The patient was seen in followup for chronic pain. A review over at their current pain status was discussed. Drug registry was checked. Prescriptions were given.  Regular follow-up recommended. Discussion was held regarding the importance of compliance with medication as well as pain medication contract.  Patient was informed that medication may cause drowsiness and should not be combined  with other medications/alcohol or street drugs. If the patient feels medication is causing altered alertness then do not drive or operate dangerous equipment.  Should be noted that the patient appears to be meeting appropriate use of opioids and response.  Evidenced by improved function and decent pain control without significant side effects and no evidence of overt aberrancy issues.  Upon discussion with the patient today they understand that opioid therapy is optional and they feel that the pain has been refractory to reasonable conservative measures and is significant and affecting quality of life enough to warrant ongoing therapy and wishes to continue opioids.  Refills were provided.  [facility] medical Board guidelines regarding the pain medicine has been reviewed.  CDC guidelines most updated 2022 has been reviewed by the prescriber.  PDMP is checked on a regular basis yearly urine drug screen and pain management contract  - ToxASSURE Select 13 (MW), Urine  2. Peripheral vascular disease (HCC) Will repeat ultrasound 2 years ago it looked good but with his ulcer on his foot we need to make sure the circulation is doing well - US ARTERIAL ABI (SCREENING LOWER EXTREMITY)  3. History of amputation of great toe (HCC) Very important for him to see his podiatrist he states he will be setting up an  appointment - US ARTERIAL ABI (SCREENING LOWER EXTREMITY)  4. DM type 2 with diabetic mixed hyperlipidemia (HCC) He needs to do up-to-date lab work will also refer him for assistance from the clinical pharmacist for continuous glucose monitoring him also to hopefully improve A1c's - Hemoglobin A1c - Basic Metabolic Panel - Microalbumin/Creatinine Ratio, Urine - US ARTERIAL ABI (SCREENING LOWER EXTREMITY) - AMB Referral to Pharmacy Medication Management  5. Short-term memory loss Continue medication.  No driving  6. Diabetic peripheral neuropathy [facility]) Patient will be seeing podiatry We are doing a circulation test  - AMB Referral to Pharmacy Medication Management  7. Screening PSA (prostate specific antigen) Screening - PSA  8. Mixed hyperlipidemia Keep LDL below 70 continue medication - Lipid Panel  9. High risk medication use Check labs - Hepatic Function Panel Neuropathy in the feet noted Follow-up 3 months

## 2023-05-31 NOTE — Progress Notes (Signed)
   Care Guide Note  05/31/2023 Name: [name] MRN: [id number] DOB: Nov 13, 1955  Referred by: Babs Sciara, MD Reason for referral : Care Management (Outreach to schedule with pharm d )   [name] is a 67 y.o. year old male who is a primary care patient of Luking, Jonna Coup, MD. [name] was referred to the pharmacist for assistance related to DM.    Successful contact was made with the patient to discuss pharmacy services including being ready for the pharmacist to call at least 5 minutes before the scheduled appointment time, to have medication bottles and any blood sugar or blood pressure readings ready for review. The patient agreed to meet with the pharmacist via with the pharmacist via telephone visit on (date/time).  06/07/2023  Penne Lash, RMA Care Guide [facility]  [place], [place] [address] Direct Dial: [phone] [email or web address]

## 2023-06-06 ENCOUNTER — Ambulatory Visit (HOSPITAL_COMMUNITY): Payer: Medicare HMO

## 2023-06-07 ENCOUNTER — Other Ambulatory Visit: Payer: Medicare HMO | Admitting: Pharmacist

## 2023-06-07 NOTE — Progress Notes (Signed)
06/07/2023 Name: [name] MRN: [id number] DOB: July 30, 1956  Chief Complaint  Patient presents with   Diabetes    [name] is a 67 y.o. year old male who presented for a telephone visit.   They were referred to the pharmacist by their PCP for assistance in managing diabetes.    Subjective:  Care Team: Primary Care Provider: Babs Sciara, MD  Medication Access/Adherence  Current Pharmacy:  [facility] [phone] - [place], [place] - [address] AT [facility] [address]  [place] [address] Phone: [phone] Fax: [phone]   Diabetes:  Current medications: Tresiba 50-60 units Medications tried in the past:  metformin (GI intolerance)  Current glucose readings: n/a  -Patient is injecting insulin once daily -He would greatly benefit from a continuous glucose monitoring system (I.e. libre or dexcom)  Patient denies hypoglycemic s/sx including dizziness, shakiness, sweating. Patient denies hyperglycemic symptoms including polyuria, polydipsia, polyphagia, nocturia, neuropathy, blurred vision.  Current meal patterns:  Discussed meal planning options and Plate method for healthy eating Avoid sugary drinks and desserts Incorporate balanced protein, non starchy veggies, 1 serving of carbohydrate with each meal Increase water intake Increase physical activity as able  Current physical activity: encouraged as able  Current medication access support: [employer] medicare   Objective:  Lab Results  Component Value Date   HGBA1C 8.0 (H) 03/14/2023    Lab Results  Component Value Date   CREATININE 1.27 03/14/2023   BUN 19 03/14/2023   NA 140 03/14/2023   K 4.6 03/14/2023   CL 105 03/14/2023   CO2 27 03/14/2023    Lab Results  Component Value Date   CHOL 132 11/08/2022   HDL 46 11/08/2022   LDLCALC 76 11/08/2022   TRIG 43 11/08/2022   CHOLHDL 2.9 11/08/2022    Medications Reviewed Today     Reviewed by Danella Maiers, [facility]  (Pharmacist) on 06/24/23 at 1534  Med List Status: <None>   Medication Order Taking? Sig Documenting Provider Last Dose Status Informant  atorvastatin (LIPITOR) 10 MG tablet [id number] No TAKE ONE TABLET BY MOUTH ON MONDAY AND FRIDAY Babs Sciara, MD Taking Active   betamethasone valerate ointment (VALISONE) 0.1 % [id number] No Apply 1 Application topically 2 (two) times daily as needed (Psoriasis). Babs Sciara, MD Taking Active   clopidogrel (PLAVIX) 75 MG tablet [id number] No Take 1 tablet (75 mg total) by mouth daily. Babs Sciara, MD Taking Active   ergocalciferol (VITAMIN D2) 1.25 MG (50000 UT) capsule [id number] No Take 1 capsule by mouth once a week. [provider] Taking Active Spouse/Significant Other  furosemide (LASIX) 20 MG tablet [id number] No Take 1 tablet by mouth every morning as needed Luking, Jonna Coup, MD Taking Active   Insulin Pen Needle (B-D ULTRAFINE III SHORT PEN) 31G X 8 MM MISC [id number] No USE PEN NEEDLES DAILY WITH LANTUS Babs Sciara, MD Taking Active Spouse/Significant Other  memantine (NAMENDA) 5 MG tablet [id number] No TAKE 1 TABLET BY MOUTH TWICE A DAY Luking, Scott A, MD Taking Active   metFORMIN (GLUCOPHAGE-XR) 500 MG 24 hr tablet [id number] No TAKE 1 TABLET BY MOUTH EVERY DAY WITH BREAKFAST Luking, Jonna Coup, MD Taking Active   metoprolol succinate (TOPROL XL) 25 MG 24 hr tablet [id number] No Take 1 tablet (25 mg total) by mouth daily. Babs Sciara, MD Taking Active   Multiple Vitamins-Minerals (MULTIVITAMIN WITH MINERALS) tablet [id number] No Take 1 tablet by mouth daily. [provider] Taking Active Spouse/Significant  Other  mupirocin ointment (BACTROBAN) 2 % [id number] No Apply topically daily. Apply thin amount bid Babs Sciara, MD Taking Active   oxyCODONE-acetaminophen (PERCOCET) 10-325 MG tablet [id number] No One qid prn pain Babs Sciara, MD Taking Active   oxyCODONE-acetaminophen (PERCOCET) 10-325 MG tablet [id number] No 1 taken 4  times daily as needed for pain Babs Sciara, MD Taking Active   oxyCODONE-acetaminophen (PERCOCET) 10-325 MG tablet [id number] No One qid prn pain Luking, Jonna Coup, MD Taking Active   sertraline (ZOLOFT) 50 MG tablet [id number] No Take 1 tablet (50 mg total) by mouth daily. Babs Sciara, MD Taking Active   silver sulfADIAZINE (SILVADENE) 1 % cream [id number] No Apply 1 Application topically daily. Babs Sciara, MD Taking Active   TRESIBA FLEXTOUCH 100 UNIT/ML FlexTouch Pen [id number] No Inject 56 Units into the skin daily. [provider] Taking Active   valsartan (DIOVAN) 40 MG tablet [id number] No TAKE 1 TABLET BY MOUTH EVERY DAY Luking, Scott A, MD Taking Active              Assessment/Plan:   Diabetes: - Currently uncontrolled--A1c 8.0% - Reviewed long term cardiovascular and renal outcomes of uncontrolled blood - Patient stated he would like his wife to be present for visit & today she was not available during this time.  I will route to CMA for rescheduling   Follow Up Plan: reschedule when wife available   Kieth Brightly, PharmD, BCACP Clinical Pharmacist, [facility]

## 2023-06-09 ENCOUNTER — Ambulatory Visit (INDEPENDENT_AMBULATORY_CARE_PROVIDER_SITE_OTHER): Payer: Medicare HMO

## 2023-06-09 VITALS — Ht 75.0 in | Wt 256.0 lb

## 2023-06-09 DIAGNOSIS — Z0000 Encounter for general adult medical examination without abnormal findings: Secondary | ICD-10-CM

## 2023-06-09 NOTE — Progress Notes (Signed)
Because this visit was a virtual/telehealth visit,  certain criteria was not obtained, such a blood pressure, CBG if patient is a diabetic, and timed get up and go. Any medications not marked as "taking" was not mentioned during the medication reconciliation part of the visit. Any vitals not documented were not able to be obtained due to this being a telehealth visit. Vitals that have been documented are verbally provided by the patient.  Patient was unable to self-report a recent blood pressure reading due to a lack of equipment at home via telehealth.  Patients wife manages patients medications and per his consent she did the medication reconciliation part of the visit.  Subjective:   [name] is a 67 y.o. male who presents for Medicare Annual/Subsequent preventive examination.  Visit Complete: Virtual  I connected with  [name] on 06/09/23 by a audio enabled telemedicine application and verified that I am speaking with the correct person using two identifiers.  Patient Location: Home  Provider Location: Office/Clinic  I discussed the limitations of evaluation and management by telemedicine. The patient expressed understanding and agreed to proceed.  Patient Medicare AWV questionnaire was completed by the patient on na; I have confirmed that all information answered by patient is correct and no changes since this date.  Review of Systems     Cardiac Risk Factors include: advanced age (>37men, >5 women);diabetes mellitus;dyslipidemia;hypertension;obesity (BMI >30kg/m2);sedentary lifestyle;male gender     Objective:    Today's Vitals   06/09/23 0952 06/09/23 0953  Weight: 256 lb (116.1 kg)   Height: 6\' 3"  (1.905 m)   PainSc:  5    Body mass index is 32 kg/m.     06/09/2023    9:58 AM 03/23/2022    8:10 PM 06/02/2020    6:37 AM 12/17/2018   11:45 AM 09/18/2018    1:26 PM 09/18/2018    8:57 AM 09/13/2016   11:55 AM  Advanced Directives  Does Patient Have a Medical  Advance Directive? No No No No No No No  Would patient like information on creating a medical advance directive? No - Patient declined No - Patient declined No - Patient declined No - Patient declined No - Patient declined  No - Patient declined    Current Medications (verified) Outpatient Encounter Medications as of 06/09/2023  Medication Sig   atorvastatin (LIPITOR) 10 MG tablet TAKE ONE TABLET BY MOUTH ON MONDAY AND FRIDAY   betamethasone valerate ointment (VALISONE) 0.1 % Apply 1 Application topically 2 (two) times daily as needed (Psoriasis).   clopidogrel (PLAVIX) 75 MG tablet Take 1 tablet (75 mg total) by mouth daily.   ergocalciferol (VITAMIN D2) 1.25 MG (50000 UT) capsule Take 1 capsule by mouth once a week.   furosemide (LASIX) 20 MG tablet Take 1 tablet by mouth every morning as needed   Insulin Pen Needle (B-D ULTRAFINE III SHORT PEN) 31G X 8 MM MISC USE PEN NEEDLES DAILY WITH LANTUS   memantine (NAMENDA) 5 MG tablet TAKE 1 TABLET BY MOUTH TWICE A DAY   metFORMIN (GLUCOPHAGE-XR) 500 MG 24 hr tablet TAKE 1 TABLET BY MOUTH EVERY DAY WITH BREAKFAST   metoprolol succinate (TOPROL XL) 25 MG 24 hr tablet Take 1 tablet (25 mg total) by mouth daily.   Multiple Vitamins-Minerals (MULTIVITAMIN WITH MINERALS) tablet Take 1 tablet by mouth daily.   mupirocin ointment (BACTROBAN) 2 % Apply topically daily. Apply thin amount bid   oxyCODONE-acetaminophen (PERCOCET) 10-325 MG tablet One qid prn pain  oxyCODONE-acetaminophen (PERCOCET) 10-325 MG tablet 1 taken 4 times daily as needed for pain   oxyCODONE-acetaminophen (PERCOCET) 10-325 MG tablet One qid prn pain   sertraline (ZOLOFT) 50 MG tablet Take 1 tablet (50 mg total) by mouth daily.   silver sulfADIAZINE (SILVADENE) 1 % cream Apply 1 Application topically daily.   TRESIBA FLEXTOUCH 100 UNIT/ML FlexTouch Pen Inject 56 Units into the skin daily.   valsartan (DIOVAN) 40 MG tablet TAKE 1 TABLET BY MOUTH EVERY DAY   [DISCONTINUED] insulin  glargine (LANTUS SOLOSTAR) 100 UNIT/ML Solostar Pen INJECT 54 UNITS AT BEDTIME  MAY TITRATE UP TO 60 UNITS UNTO THE SKIN AT BEDTIME FOR DIABETES (Patient not taking: Reported on 06/09/2023)   No facility-administered encounter medications on file as of 06/09/2023.    Allergies (verified) Metformin and related, Statins, and Vicodin [hydrocodone-acetaminophen]   History: Past Medical History:  Diagnosis Date   CAD (coronary artery disease)    a. s/p BMS to RCA in 2008 b. no ischemia by NST in 03/2021   Cardiomyopathy [facility])    CHF (congestive heart failure) (HCC)    a. EF 30-35% in 2019 with similar results by repeat echo in 03/2021   Depression    DJD (degenerative joint disease)    DM type 2 with diabetic mixed hyperlipidemia (HCC) 12/11/2018   Essential hypertension    Gout    History of inferior wall myocardial infarction 2008   Hypercholesterolemia    Hyperlipidemia    Obesity    Oral candidiasis    Peripheral vascular disease (HCC)    Stroke [facility])    December 2019   Type 2 diabetes mellitus [facility])    Past Surgical History:  Procedure Laterality Date   AMPUTATION Right 01/12/2015   Procedure: PARTIAL AMPUTATION 1ST RAY RIGHT FOOT (Amputation of right great toe and portion of the first metatarsal bone right foot);  Surgeon: Ferman Hamming, DPM;  Location: [facility] ORS;  Service: Podiatry;  Laterality: Right;   AMPUTATION Right 09/14/2016   Procedure: PARTIAL AMPUTATION RAY 2ND TOE RIGHT FOOT;  Surgeon: Ferman Hamming, DPM;  Location: [facility];  Service: Podiatry;  Laterality: Right;   COLONOSCOPY  08/2008   COLONOSCOPY WITH PROPOFOL N/A 06/02/2020   Procedure: COLONOSCOPY WITH PROPOFOL;  Surgeon: Lanelle Bal, DO;  Location: [facility] ENDO SUITE;  Service: Endoscopy;  Laterality: N/A;  7:30am   ESOPHAGOGASTRODUODENOSCOPY  08/2008   LAMINOTOMY  2001, 2006     Right L4-5 interlaminar laminotomy with excision of herniated   disc with operating microscope.   NECK SURGERY     cervical  disc   ORIF WRIST FRACTURE Right    PERIPHERAL VASCULAR CATHETERIZATION N/A 02/27/2015   Procedure: Abdominal Aortogram;  Surgeon: Sherren Kerns, MD;  Location: [facility] INVASIVE CV LAB;  Service: Cardiovascular;  Laterality: N/A;   PERIPHERAL VASCULAR CATHETERIZATION Bilateral 02/27/2015   Procedure: Lower Extremity Angiography;  Surgeon: Sherren Kerns, MD;  Location: [facility] INVASIVE CV LAB;  Service: Cardiovascular;  Laterality: Bilateral;   POLYPECTOMY  06/02/2020   Procedure: POLYPECTOMY;  Surgeon: Lanelle Bal, DO;  Location: [facility];  Service: Endoscopy;;   Right ankle fracture open reduction internal fixation.  2003   WOUND DEBRIDEMENT Right 12/24/2014   Procedure: DEBRIDEMENT SOFT TISSUE AND BONE RIGHT FOOT;  Surgeon: Ferman Hamming, DPM;  Location: [facility];  Service: Podiatry;  Laterality: Right;   Family History  Problem Relation Age of Onset   Diabetes Mother    Colon cancer Mother    Diabetes  Father    Lung cancer Father    Cancer Sister    Diabetes Sister    Heart disease Sister        before age 85   Social History   Socioeconomic History   Marital status: Married    Spouse name: Not on file   Number of children: Not on file   Years of education: Not on file   Highest education level: Not on file  Occupational History   Not on file  Tobacco Use   Smoking status: Former    Current packs/day: 0.00    Average packs/day: 1 pack/day for 10.0 years (10.0 ttl pk-yrs)    Types: Cigarettes    Start date: 12/22/1960    Quit date: 12/23/1970    Years since quitting: 52.4   Smokeless tobacco: Never   Tobacco comments:    quit in 1979  Vaping Use   Vaping status: Never Used  Substance and Sexual Activity   Alcohol use: No    Alcohol/week: 0.0 standard drinks of alcohol   Drug use: Yes    Types: Marijuana   Sexual activity: Yes    Birth control/protection: None  Other Topics Concern   Not on file  Social History Narrative    He is married and has 3 kids.  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  He denies any tobacco or alcohol use.    Social Determinants of Health   Financial Resource Strain: Low Risk  (06/09/2023)   Overall Financial Resource Strain (CARDIA)    Difficulty of Paying Living Expenses: Not hard at all  Food Insecurity: No Food Insecurity (06/09/2023)   Hunger Vital Sign    Worried About Running Out of Food in the Last Year: Never true    Ran Out of Food in the Last Year: Never true  Transportation Needs: No Transportation Needs (06/09/2023)   PRAPARE - Administrator, Civil Service (Medical): No    Lack of Transportation (Non-Medical): No  Physical Activity: Insufficiently Active (06/09/2023)   Exercise Vital Sign    Days of Exercise per Week: 7 days    Minutes of Exercise per Session: 20 min  Stress: No Stress Concern Present (06/09/2023)   [employer] of Occupational Health - Occupational Stress Questionnaire    Feeling of Stress : Not at all  Social Connections: Moderately Isolated (06/09/2023)   Social Connection and Isolation Panel [NHANES]    Frequency of Communication with Friends and Family: More than three times a week    Frequency of Social Gatherings with Friends and Family: More than three times a week    Attends Religious Services: Never    Database administrator or Organizations: No    Attends Engineer, structural: Never    Marital Status: Married    Tobacco Counseling Counseling given: Yes Tobacco comments: quit in 1979   Clinical Intake:  Pre-visit preparation completed: Yes  Pain : 0-10 Pain Score: 5  Pain Type: Chronic pain Pain Location: Leg Pain Orientation: Right, Left Pain Descriptors / Indicators: Aching, Constant Pain Onset: More than a month ago Pain Frequency: Constant     BMI - recorded: 32 Nutritional Risks: None Diabetes: Yes CBG done?: No (telehealth visit. unable to obtain cbg at the visit.) Did pt. bring in CBG monitor from home?:  No  How often do you need to have someone help you when you read instructions, pamphlets, or other written  materials from your doctor or pharmacy?: 1 - Never  Interpreter Needed?: No  Information entered by :: Abby Mariangela Heldt, CMA   Activities of Daily Living    06/09/2023    9:56 AM  In your present state of health, do you have any difficulty performing the following activities:  Hearing? 0  Vision? 0  Difficulty concentrating or making decisions? 1  Walking or climbing stairs? 1  Dressing or bathing? 0  Doing errands, shopping? 1  Comment can no longer drive but wife takes him where he needs to go  Preparing Food and eating ? N  Using the Toilet? N  In the past six months, have you accidently leaked urine? N  Do you have problems with loss of bowel control? N  Managing your Medications? Y  Comment wife manages medications  Managing your Finances? N  Housekeeping or managing your Housekeeping? Y  Comment patients wife does this.    Patient Care Team: Babs Sciara, MD as PCP - General (Family Medicine) Pricilla Riffle, MD as PCP - Cardiology (Cardiology) Emilio Math, DPM as Consulting Physician (Podiatry) August Saucer, Corrie Mckusick, MD as Consulting Physician (Orthopedic Surgery) Aliene Beams, MD as Consulting Physician (Neurosurgery) Ferman Hamming, DPM as Consulting Physician (Podiatry) Beryle Beams, MD as Consulting Physician (Neurology)  Indicate any recent Medical Services you may have received from other than [facility] providers in the past year (date may be approximate).     Assessment:   This is a routine wellness examination for [name].  Hearing/Vision screen Hearing Screening - Comments:: Patient denies any hearing difficulties.   Vision Screening - Comments:: Wears rx glasses - up to date with routine eye exams at [facility]   Dietary issues and exercise activities discussed:     Goals Addressed             This Visit's Progress     Patient Stated       Remain active and eat healthier and increase water intake        Depression Screen    06/09/2023   10:01 AM 05/31/2023    9:30 AM 03/15/2023    1:42 PM 01/10/2023    9:36 AM 02/25/2021    9:12 AM 12/30/2020    1:37 PM 12/30/2020    1:10 PM  PHQ 2/9 Scores  PHQ - 2 Score 0 4 2 2  0 0 0  PHQ- 9 Score 0 13 10 10        Fall Risk    06/09/2023    9:59 AM 05/31/2023    9:30 AM 03/15/2023    1:42 PM 01/10/2023    9:36 AM 03/09/2022    9:15 AM  Fall Risk   Falls in the past year? 1 1 1 1  0  Number falls in past yr: 1 1 1  0 0  Injury with Fall? 1 0 0 0 0  Risk for fall due to : History of fall(s);Impaired balance/gait;Orthopedic patient;Impaired mobility History of fall(s)   No Fall Risks  Follow up Education provided;Falls prevention discussed;Falls evaluation completed Falls evaluation completed   Falls evaluation completed    MEDICARE RISK AT HOME: Medicare Risk at Home Any stairs in or around the home?: No If so, are there any without handrails?: No Home free of loose throw rugs in walkways, pet beds, electrical cords, etc?: Yes Adequate lighting in your home to reduce risk of falls?: Yes Life alert?: No Use of a cane, walker or w/c?: No Grab bars in  the bathroom?: No Shower chair or bench in shower?: No Elevated toilet seat or a handicapped toilet?: No  TIMED UP AND GO:  Was the test performed?  No    Cognitive Function:    11/08/2022   10:29 AM  MMSE - Mini Mental State Exam  Orientation to time 3  Orientation to Place 5  Registration 3  Attention/ Calculation 1  Recall 2  Language- name 2 objects 2  Language- repeat 0  Language- follow 3 step command 3  Language- read & follow direction 0  Write a sentence 0  Copy design 0  Total score 19        06/09/2023   10:00 AM  6CIT Screen  What Year? 0 points  What month? 0 points  What time? 0 points  Count back from 20 0 points  Months in reverse 0 points  Repeat phrase 0 points   Total Score 0 points    Immunizations Immunization History  Administered Date(s) Administered   Fluad Quad(high Dose 65+) 09/15/2020, 08/10/2021, 07/15/2022   Influenza Split 08/26/2013   Influenza,inj,Quad PF,6+ Mos 09/16/2014, 08/24/2015, 08/24/2016, 08/24/2017, 09/25/2018, 07/20/2019   Moderna SARS-COV2 Booster Vaccination 09/24/2020   Moderna Sars-Covid-2 Vaccination 12/20/2019, 01/21/2020   PNEUMOCOCCAL CONJUGATE-20 11/09/2021   Pneumococcal Polysaccharide-23 08/26/2013, 04/07/2020   Td 05/29/2018    TDAP status: Up to date  Flu Vaccine status: Declined, Education has been provided regarding the importance of this vaccine but patient still declined. Advised may receive this vaccine at local pharmacy or Health Dept. Aware to provide a copy of the vaccination record if obtained from local pharmacy or Health Dept. Verbalized acceptance and understanding.  Pneumococcal vaccine status: Declined,  Education has been provided regarding the importance of this vaccine but patient still declined. Advised may receive this vaccine at local pharmacy or Health Dept. Aware to provide a copy of the vaccination record if obtained from local pharmacy or Health Dept. Verbalized acceptance and understanding.   Covid-19 vaccine status: Declined, Education has been provided regarding the importance of this vaccine but patient still declined. Advised may receive this vaccine at local pharmacy or Health Dept.or vaccine clinic. Aware to provide a copy of the vaccination record if obtained from local pharmacy or Health Dept. Verbalized acceptance and understanding.  Qualifies for Shingles Vaccine? Yes   Zostavax completed No   Shingrix Completed?: No.    Education has been provided regarding the importance of this vaccine. Patient has been advised to call insurance company to determine out of pocket expense if they have not yet received this vaccine. Advised may also receive vaccine at local pharmacy or  Health Dept. Verbalized acceptance and understanding.  Screening Tests Health Maintenance  Topic Date Due   Medicare Annual Wellness (AWV)  07/13/2022   Diabetic kidney evaluation - Urine ACR  02/26/2023   INFLUENZA VACCINE  01/15/2024 (Originally 05/18/2023)   COVID-19 Vaccine (3 - Moderna risk series) 05/16/2024 (Originally 10/22/2020)   Zoster Vaccines- Shingrix (1 of 2) 05/23/2024 (Originally 12/29/1974)   HEMOGLOBIN A1C  09/14/2023   OPHTHALMOLOGY EXAM  01/19/2024   Diabetic kidney evaluation - eGFR measurement  03/13/2024   FOOT EXAM  05/30/2024   DTaP/Tdap/Td (2 - Tdap) 05/29/2028   Colonoscopy  06/02/2030   Pneumonia Vaccine 35+ Years old  Completed   Hepatitis C Screening  Completed   HPV VACCINES  Aged Out    Health Maintenance  Health Maintenance Due  Topic Date Due   Medicare Annual Wellness (AWV)  07/13/2022  Diabetic kidney evaluation - Urine ACR  02/26/2023    Colorectal cancer screening: Type of screening: Colonoscopy. Completed 06/02/2020. Repeat every 5 years  Lung Cancer Screening: (Low Dose CT Chest recommended if Age 7-80 years, 20 pack-year currently smoking OR have quit w/in 15years.) does not qualify.   Lung Cancer Screening Referral: na  Additional Screening:  Hepatitis C Screening: does not qualify; Completed 10/05/2017  Vision Screening: Recommended annual ophthalmology exams for early detection of glaucoma and other disorders of the eye. Is the patient up to date with their annual eye exam?  Yes  Who is the provider or what is the name of the office in which the patient attends annual eye exams? [facility] If pt is not established with a provider, would they like to be referred to a provider to establish care? No .   Dental Screening: Recommended annual dental exams for proper oral hygiene  Diabetic Foot Exam: Diabetic Foot Exam: Completed 05/31/2023  Community Resource Referral / Chronic Care Management: CRR required this visit?  No    CCM required this visit?  No     Plan:     I have personally reviewed and noted the following in the patient's chart:   Medical and social history Use of alcohol, tobacco or illicit drugs  Current medications and supplements including opioid prescriptions. Patient is currently taking opioid prescriptions. Information provided to patient regarding non-opioid alternatives. Patient advised to discuss non-opioid treatment plan with their provider. Functional ability and status Nutritional status Physical activity Advanced directives List of other physicians Hospitalizations, surgeries, and ER visits in previous 12 months Vitals Screenings to include cognitive, depression, and falls Referrals and appointments  In addition, I have reviewed and discussed with patient certain preventive protocols, quality metrics, and best practice recommendations. A written personalized care plan for preventive services as well as general preventive health recommendations were provided to patient.     Jordan Hawks Oyinkansola Truax, CMA   06/09/2023   After Visit Summary: (MyChart) Due to this being a telephonic visit, the after visit summary with patients personalized plan was offered to patient via MyChart   Nurse Notes:

## 2023-06-09 NOTE — Patient Instructions (Addendum)
[name] , Thank you for taking time to come for your Medicare Wellness Visit. I appreciate your ongoing commitment to your health goals. Please review the following plan we discussed and let me know if I can assist you in the future.   Referrals/Orders/Follow-Ups/Clinician Recommendations:   This is a list of the screening recommended for you and due dates:  Health Maintenance  Topic Date Due   Yearly kidney health urinalysis for diabetes  02/26/2023   Flu Shot  01/15/2024*   COVID-19 Vaccine (3 - Moderna risk series) 05/16/2024*   Zoster (Shingles) Vaccine (1 of 2) 05/23/2024*   Hemoglobin A1C  09/14/2023   Eye exam for diabetics  01/19/2024   Yearly kidney function blood test for diabetes  03/13/2024   Complete foot exam   05/30/2024   Medicare Annual Wellness Visit  06/08/2024   Colon Cancer Screening  06/02/2025   DTaP/Tdap/Td vaccine (2 - Tdap) 05/29/2028   Pneumonia Vaccine  Completed   Hepatitis C Screening  Completed   HPV Vaccine  Aged Out  *Topic was postponed. The date shown is not the original due date.    Advanced directives: (Declined) Advance directive discussed with you today. Even though you declined this today, please call our office should you change your mind, and we can give you the proper paperwork for you to fill out.  Next Medicare Annual Wellness Visit scheduled for next year: Yes  Preventive Care 84 Years and Older, Male Preventive care refers to lifestyle choices and visits with your health care provider that can promote health and wellness. Preventive care visits are also called wellness exams. What can I expect for my preventive care visit? Counseling During your preventive care visit, your health care provider may ask about your: Medical history, including: Past medical problems. Family medical history. History of falls. Current health, including: Emotional well-being. Home life and relationship well-being. Sexual activity. Memory and ability  to understand (cognition). Lifestyle, including: Alcohol, nicotine or tobacco, and drug use. Access to firearms. Diet, exercise, and sleep habits. Work and work Astronomer. Sunscreen use. Safety issues such as seatbelt and bike helmet use. Physical exam Your health care provider will check your: Height and weight. These may be used to calculate your BMI (body mass index). BMI is a measurement that tells if you are at a healthy weight. Waist circumference. This measures the distance around your waistline. This measurement also tells if you are at a healthy weight and may help predict your risk of certain diseases, such as type 2 diabetes and high blood pressure. Heart rate and blood pressure. Body temperature. Skin for abnormal spots. What immunizations do I need?  Vaccines are usually given at various ages, according to a schedule. Your health care provider will recommend vaccines for you based on your age, medical history, and lifestyle or other factors, such as travel or where you work. What tests do I need? Screening Your health care provider may recommend screening tests for certain conditions. This may include: Lipid and cholesterol levels. Diabetes screening. This is done by checking your blood sugar (glucose) after you have not eaten for a while (fasting). Hepatitis C test. Hepatitis B test. HIV (human immunodeficiency virus) test. STI (sexually transmitted infection) testing, if you are at risk. Lung cancer screening. Colorectal cancer screening. Prostate cancer screening. Abdominal aortic aneurysm (AAA) screening. You may need this if you are a current or former smoker. Talk with your health care provider about your test results, treatment options, and if necessary,  the need for more tests. Follow these instructions at home: Eating and drinking  Eat a diet that includes fresh fruits and vegetables, whole grains, lean protein, and low-fat dairy products. Limit your intake  of foods with high amounts of sugar, saturated fats, and salt. Take vitamin and mineral supplements as recommended by your health care provider. Do not drink alcohol if your health care provider tells you not to drink. If you drink alcohol: Limit how much you have to 0-2 drinks a day. Know how much alcohol is in your drink. In the [place] one drink equals one 12 oz bottle of beer (355 mL), one 5 oz glass of wine (148 mL), or one 1 oz glass of hard liquor (44 mL). Lifestyle Brush your teeth every morning and night with fluoride toothpaste. Floss one time each day. Exercise for at least 30 minutes 5 or more days each week. Do not use any products that contain nicotine or tobacco. These products include cigarettes, chewing tobacco, and vaping devices, such as e-cigarettes. If you need help quitting, ask your health care provider. Do not use drugs. If you are sexually active, practice safe sex. Use a condom or other form of protection to prevent STIs. Take aspirin only as told by your health care provider. Make sure that you understand how much to take and what form to take. Work with your health care provider to find out whether it is safe and beneficial for you to take aspirin daily. Ask your health care provider if you need to take a cholesterol-lowering medicine (statin). Find healthy ways to manage stress, such as: Meditation, yoga, or listening to music. Journaling. Talking to a trusted person. Spending time with friends and family. Safety Always wear your seat belt while driving or riding in a vehicle. Do not drive: If you have been drinking alcohol. Do not ride with someone who has been drinking. When you are tired or distracted. While texting. If you have been using any mind-altering substances or drugs. Wear a helmet and other protective equipment during sports activities. If you have firearms in your house, make sure you follow all gun safety procedures. Minimize exposure to UV  radiation to reduce your risk of skin cancer. What's next? Visit your health care provider once a year for an annual wellness visit. Ask your health care provider how often you should have your eyes and teeth checked. Stay up to date on all vaccines. This information is not intended to replace advice given to you by your health care provider. Make sure you discuss any questions you have with your health care provider. Document Revised: 03/31/2021 Document Reviewed: 03/31/2021 Elsevier Patient Education  2024 [employer]. Understanding Your Risk for Falls Millions of people have serious injuries from falls each year. It is important to understand your risk of falling. Talk with your health care provider about your risk and what you can do to lower it. If you do have a serious fall, make sure to tell your provider. Falling once raises your risk of falling again. How can falls affect me? Serious injuries from falls are common. These include: Broken bones, such as hip fractures. Head injuries, such as traumatic brain injuries (TBI) or concussions. A fear of falling can cause you to avoid activities and stay at home. This can make your muscles weaker and raise your risk for a fall. What can increase my risk? There are a number of risk factors that increase your risk for falling. The more risk factors  you have, the higher your risk of falling. Serious injuries from a fall happen most often to people who are older than 68 years old. Teenagers and young adults ages 14-29 are also at higher risk. Common risk factors include: Weakness in the lower body. Being generally weak or confused due to long-term (chronic) illness. Dizziness or balance problems. Poor vision. Medicines that cause dizziness or drowsiness. These may include: Medicines for your blood pressure, heart, anxiety, insomnia, or swelling (edema). Pain medicines. Muscle relaxants. Other risk factors include: Drinking alcohol. Having  had a fall in the past. Having foot pain or wearing improper footwear. Working at a dangerous job. Having any of the following in your home: Tripping hazards, such as floor clutter or loose rugs. Poor lighting. Pets. Having dementia or memory loss. What actions can I take to lower my risk of falling?     Physical activity Stay physically fit. Do strength and balance exercises. Consider taking a regular class to build strength and balance. Yoga and tai chi are good options. Vision Have your eyes checked every year and your prescription for glasses or contacts updated as needed. Shoes and walking aids Wear non-skid shoes. Wear shoes that have rubber soles and low heels. Do not wear high heels. Do not walk around the house in socks or slippers. Use a cane or walker as told by your provider. Home safety Attach secure railings on both sides of your stairs. Install grab bars for your bathtub, shower, and toilet. Use a non-skid mat in your bathtub or shower. Attach bath mats securely with double-sided, non-slip rug tape. Use good lighting in all rooms. Keep a flashlight near your bed. Make sure there is a clear path from your bed to the bathroom. Use night-lights. Do not use throw rugs. Make sure all carpeting is taped or tacked down securely. Remove all clutter from walkways and stairways, including extension cords. Repair uneven or broken steps and floors. Avoid walking on icy or slippery surfaces. Walk on the grass instead of on icy or slick sidewalks. Use ice melter to get rid of ice on walkways in the winter. Use a cordless phone. Questions to ask your health care provider Can you help me check my risk for a fall? Do any of my medicines make me more likely to fall? Should I take a vitamin D supplement? What exercises can I do to improve my strength and balance? Should I make an appointment to have my vision checked? Do I need a bone density test to check for weak bones  (osteoporosis)? Would it help to use a cane or a walker? Where to find more information Centers for Disease Control and Prevention, STEADI: TonerPromos.no Community-Based Fall Prevention Programs: TonerPromos.no [employer] on Aging: BaseRingTones.pl Contact a health care provider if: You fall at home. You are afraid of falling at home. You feel weak, drowsy, or dizzy. This information is not intended to replace advice given to you by your health care provider. Make sure you discuss any questions you have with your health care provider. Document Revised: 06/06/2022 Document Reviewed: 06/06/2022 Elsevier Patient Education  2024 Elsevier Inc. Preventing Diabetes Mellitus Complications You can help to prevent or slow down problems that are caused by diabetes (diabetes mellitus). If you follow your diabetes plan and take care of yourself, you can lower your chances of having severe problems. What actions can I take to prevent problems caused by diabetes? Managing your diabetes  Follow instructions from your health care providers about how  to manage your diabetes. You may have a team of health care providers. They can teach you how to care for yourself and can answer any questions you have. Learn about your condition. This can help you make healthy choices when it comes to eating and physical activity. Know your target range for your blood sugar (glucose). Check your blood glucose levels. Your health care provider will help you decide how often to check your levels. How often you check may depend on your goals for treatment and how well you are meeting them. Ask your health care provider if you should take a low dose of aspirin every day. Ask what dose is best for you. Taking a low dose of aspirin can help prevent heart disease. Controlling your blood pressure and cholesterol Your target blood pressure is based on: Your age. Your medicines. How long you have had diabetes. Other conditions you  have. Controlling your cholesterol may: Help prevent heart disease and stroke. Improve your blood flow. To control your blood pressure and cholesterol: Follow instructions from your health care provider about meal planning, exercise, and medicines. Make sure your health care provider checks your blood pressure at every visit. Monitor your blood pressure at home as told by your health care provider. Have your cholesterol checked at least once a year. A medicine called statin can help to lower your cholesterol. Ask your health care provider if you are or should be taking a statin.  Medical appointments and vaccines Have yearly physical exams and eye exams. Your health care providers will tell you how often you need to see them. It may depend on your diabetes plan. Every visit with a health care provider should include a measure of: Your weight. Your blood pressure. Your blood glucose. Your A1C level should be checked: At least 2 times a year, if you are meeting your treatment goals. 4 times a year, if you are not meeting treatment goals or if your goals have changed. Your blood lipids (lipid profile) should be checked once a year. You should also be checked once a year for protein in your urine (urine microalbumin). If you have type 1 diabetes, get an eye exam within 5 years after you are diagnosed. Get an exam once a year after that first exam. If you have type 2 diabetes, get an eye exam as soon as you are diagnosed. Get an exam once a year after that first exam. Keep your vaccines current. You should get: A flu vaccine every year. A pneumonia vaccine and a hepatitis B vaccine. If you are 65 years or older, you may get the pneumonia vaccine as a series of two shots. Ask your health care provider what other vaccines you may need to get. Keep all follow-up visits. This can help ensure that problems can be avoided or found early and treated. Lifestyle Do not use any products that contain  nicotine or tobacco. These products include cigarettes, chewing tobacco, and vaping devices, such as e-cigarettes. If you need help quitting, ask your health care provider. If you quit smoking, you may: Lower your risk for heart attack, stroke, nerve disease, and kidney disease. Help your blood move through your body better. Help your blood pressure and cholesterol levels. Do not drink alcohol if: Your health care provider tells you not to drink. You are pregnant, may be pregnant, or are planning to become pregnant. If you drink alcohol: Limit how much you have to: 0-1 drink a day for women. 0-2 drinks a day  for men. Know how much alcohol is in your drink. In the [place] one drink equals one 12 oz bottle of beer (355 mL), one 5 oz glass of wine (148 mL), or one 1 oz glass of hard liquor (44 mL). Taking care of your feet Diabetes may cause you to have poor blood flow to your legs and feet. It can also cause: The skin on your feet to get thinner, break more easily, and heal more slowly. Nerve damage in your legs and feet. This can result in less feeling. You may not notice small injuries. This could lead to bigger problems. To avoid foot problems: Check your skin and feet every day for cuts, bruises, redness, blisters, or sores. Have a foot exam with your health care provider once a year. During the exam, your health care provider will: Look at the structure and skin of your feet. Check your pulses and amount of feeling in your feet. Make sure that your health care provider does a visual foot exam at every visit.  Taking care of your teeth People who have diabetes that is not controlled well are more likely to have gum disease (periodontal disease). Diabetes can make gum disease harder to control. If not treated, it can lead to tooth loss. To prevent this: Brush your teeth twice a day. Floss at least once a day. Visit your dentist 2 times a year. Managing stress Living with diabetes can be  stressful. When you are stressed, you may: Have higher blood glucose because of stress hormones. Be distracted from taking good care of yourself. Be aware of your stress level and make changes to help you manage tough times. To lower your stress levels: Think about joining a support group. Do planned relaxation or meditation. Do a hobby that you enjoy. Maintain healthy relationships. Try to exercise every day. Work with your health care provider or a mental health professional. Where to find more information American Diabetes Association: diabetes.org Association of Diabetes Care & Education Specialists: diabeteseducator.org This information is not intended to replace advice given to you by your health care provider. Make sure you discuss any questions you have with your health care provider. Document Revised: 04/06/2022 Document Reviewed: 04/06/2022 Elsevier Patient Education  2024 Elsevier Inc. Managing Pain Without Opioids Opioids are strong medicines used to treat moderate to severe pain. For some people, especially those who have long-term (chronic) pain, opioids may not be the best choice for pain management due to: Side effects like nausea, constipation, and sleepiness. The risk of addiction (opioid use disorder). The longer you take opioids, the greater your risk of addiction. Pain that lasts for more than 3 months is called chronic pain. Managing chronic pain usually requires more than one approach and is often provided by a team of health care providers working together (multidisciplinary approach). Pain management may be done at a pain management center or pain clinic. How to manage pain without the use of opioids Use non-opioid medicines Non-opioid medicines for pain may include: Over-the-counter or prescription non-steroidal anti-inflammatory drugs (NSAIDs). These may be the first medicines used for pain. They work well for muscle and bone pain, and they reduce  swelling. Acetaminophen. This over-the-counter medicine may work well for milder pain but not swelling. Antidepressants. These may be used to treat chronic pain. A certain type of antidepressant (tricyclics) is often used. These medicines are given in lower doses for pain than when used for depression. Anticonvulsants. These are usually used to treat seizures but may also  reduce nerve (neuropathic) pain. Muscle relaxants. These relieve pain caused by sudden muscle tightening (spasms). You may also use a pain medicine that is applied to the skin as a patch, cream, or gel (topical analgesic), such as a numbing medicine. These may cause fewer side effects than medicines taken by mouth. Do certain therapies as directed Some therapies can help with pain management. They include: Physical therapy. You will do exercises to gain strength and flexibility. A physical therapist may teach you exercises to move and stretch parts of your body that are weak, stiff, or painful. You can learn these exercises at physical therapy visits and practice them at home. Physical therapy may also involve: Massage. Heat wraps or applying heat or cold to affected areas. Electrical signals that interrupt pain signals (transcutaneous electrical nerve stimulation, TENS). Weak lasers that reduce pain and swelling (low-level laser therapy). Signals from your body that help you learn to regulate pain (biofeedback). Occupational therapy. This helps you to learn ways to function at home and work with less pain. Recreational therapy. This involves trying new activities or hobbies, such as a physical activity or drawing. Mental health therapy, including: Cognitive behavioral therapy (CBT). This helps you learn coping skills for dealing with pain. Acceptance and commitment therapy (ACT) to change the way you think and react to pain. Relaxation therapies, including muscle relaxation exercises and mindfulness-based stress reduction. Pain  management counseling. This may be individual, family, or group counseling.  Receive medical treatments Medical treatments for pain management include: Nerve block injections. These may include a pain blocker and anti-inflammatory medicines. You may have injections: Near the spine to relieve chronic back or neck pain. Into joints to relieve back or joint pain. Into nerve areas that supply a painful area to relieve body pain. Into muscles (trigger point injections) to relieve some painful muscle conditions. A medical device placed near your spine to help block pain signals and relieve nerve pain or chronic back pain (spinal cord stimulation device). Acupuncture. Follow these instructions at home Medicines Take over-the-counter and prescription medicines only as told by your health care provider. If you are taking pain medicine, ask your health care providers about possible side effects to watch out for. Do not drive or use heavy machinery while taking prescription opioid pain medicine. Lifestyle  Do not use drugs or alcohol to reduce pain. If you drink alcohol, limit how much you have to: 0-1 drink a day for women who are not pregnant. 0-2 drinks a day for men. Know how much alcohol is in a drink. In the [place] one drink equals one 12 oz bottle of beer (355 mL), one 5 oz glass of wine (148 mL), or one 1 oz glass of hard liquor (44 mL). Do not use any products that contain nicotine or tobacco. These products include cigarettes, chewing tobacco, and vaping devices, such as e-cigarettes. If you need help quitting, ask your health care provider. Eat a healthy diet and maintain a healthy weight. Poor diet and excess weight may make pain worse. Eat foods that are high in fiber. These include fresh fruits and vegetables, whole grains, and beans. Limit foods that are high in fat and processed sugars, such as fried and sweet foods. Exercise regularly. Exercise lowers stress and may help relieve  pain. Ask your health care provider what activities and exercises are safe for you. If your health care provider approves, join an exercise class that combines movement and stress reduction. Examples include yoga and tai chi. Get enough  sleep. Lack of sleep may make pain worse. Lower stress as much as possible. Practice stress reduction techniques as told by your therapist. General instructions Work with all your pain management providers to find the treatments that work best for you. You are an important member of your pain management team. There are many things you can do to reduce pain on your own. Consider joining an online or in-person support group for people who have chronic pain. Keep all follow-up visits. This is important. Where to find more information You can find more information about managing pain without opioids from: American Academy of Pain Medicine: painmed.org Institute for Chronic Pain: instituteforchronicpain.org American Chronic Pain Association: theacpa.org Contact a health care provider if: You have side effects from pain medicine. Your pain gets worse or does not get better with treatments or home therapy. You are struggling with anxiety or depression. Summary Many types of pain can be managed without opioids. Chronic pain may respond better to pain management without opioids. Pain is best managed when you and a team of health care providers work together. Pain management without opioids may include non-opioid medicines, medical treatments, physical therapy, mental health therapy, and lifestyle changes. Tell your health care providers if your pain gets worse or is not being managed well enough. This information is not intended to replace advice given to you by your health care provider. Make sure you discuss any questions you have with your health care provider. Document Revised: 01/13/2021 Document Reviewed: 01/13/2021 Elsevier Patient Education  2024 [employer].

## 2023-06-13 ENCOUNTER — Ambulatory Visit (HOSPITAL_COMMUNITY)
Admission: RE | Admit: 2023-06-13 | Discharge: 2023-06-13 | Disposition: A | Discharge status: Home / Self Care | Payer: Medicare HMO | Source: Ambulatory Visit | Attending: Family Medicine | Admitting: Family Medicine

## 2023-06-13 DIAGNOSIS — E782 Mixed hyperlipidemia: Secondary | ICD-10-CM | POA: Diagnosis not present

## 2023-06-13 DIAGNOSIS — I739 Peripheral vascular disease, unspecified: Secondary | ICD-10-CM | POA: Diagnosis not present

## 2023-06-13 DIAGNOSIS — E1169 Type 2 diabetes mellitus with other specified complication: Secondary | ICD-10-CM | POA: Diagnosis not present

## 2023-06-13 DIAGNOSIS — Z89419 Acquired absence of unspecified great toe: Secondary | ICD-10-CM | POA: Diagnosis not present

## 2023-06-13 DIAGNOSIS — Z89429 Acquired absence of other toe(s), unspecified side: Secondary | ICD-10-CM | POA: Diagnosis not present

## 2023-06-15 ENCOUNTER — Ambulatory Visit: Payer: Medicare HMO | Admitting: Family Medicine

## 2023-06-15 DIAGNOSIS — L97512 Non-pressure chronic ulcer of other part of right foot with fat layer exposed: Secondary | ICD-10-CM | POA: Diagnosis not present

## 2023-06-15 DIAGNOSIS — E1142 Type 2 diabetes mellitus with diabetic polyneuropathy: Secondary | ICD-10-CM | POA: Diagnosis not present

## 2023-06-29 DIAGNOSIS — L97521 Non-pressure chronic ulcer of other part of left foot limited to breakdown of skin: Secondary | ICD-10-CM | POA: Diagnosis not present

## 2023-06-29 DIAGNOSIS — E1142 Type 2 diabetes mellitus with diabetic polyneuropathy: Secondary | ICD-10-CM | POA: Diagnosis not present

## 2023-06-29 DIAGNOSIS — E1042 Type 1 diabetes mellitus with diabetic polyneuropathy: Secondary | ICD-10-CM | POA: Diagnosis not present

## 2023-07-04 DIAGNOSIS — L84 Corns and callosities: Secondary | ICD-10-CM | POA: Diagnosis not present

## 2023-07-04 DIAGNOSIS — B351 Tinea unguium: Secondary | ICD-10-CM | POA: Diagnosis not present

## 2023-07-04 DIAGNOSIS — E1142 Type 2 diabetes mellitus with diabetic polyneuropathy: Secondary | ICD-10-CM | POA: Diagnosis not present

## 2023-07-04 DIAGNOSIS — L97511 Non-pressure chronic ulcer of other part of right foot limited to breakdown of skin: Secondary | ICD-10-CM | POA: Diagnosis not present

## 2023-07-12 ENCOUNTER — Other Ambulatory Visit: Payer: Medicare HMO | Admitting: Pharmacist

## 2023-07-12 ENCOUNTER — Telehealth: Payer: Self-pay | Admitting: Pharmacist

## 2023-07-12 DIAGNOSIS — E1159 Type 2 diabetes mellitus with other circulatory complications: Secondary | ICD-10-CM

## 2023-07-12 MED ORDER — FREESTYLE LIBRE 3 READER DEVI
1 refills | Status: DC
Start: 2023-07-12 — End: 2024-01-29

## 2023-07-12 MED ORDER — FREESTYLE LIBRE 3 PLUS SENSOR MISC: 5 refills | Status: DC

## 2023-07-12 NOTE — Telephone Encounter (Signed)
PA COMPLETED VIA COVERMYMEDS ALTHOUGH NOT NEEDED WILL SEE WHAT COPAY IS

## 2023-07-18 DIAGNOSIS — L97512 Non-pressure chronic ulcer of other part of right foot with fat layer exposed: Secondary | ICD-10-CM | POA: Diagnosis not present

## 2023-07-18 DIAGNOSIS — E1142 Type 2 diabetes mellitus with diabetic polyneuropathy: Secondary | ICD-10-CM | POA: Diagnosis not present

## 2023-07-19 DIAGNOSIS — L97512 Non-pressure chronic ulcer of other part of right foot with fat layer exposed: Secondary | ICD-10-CM | POA: Diagnosis not present

## 2023-07-27 ENCOUNTER — Other Ambulatory Visit: Payer: Self-pay | Admitting: Family Medicine

## 2023-07-28 ENCOUNTER — Other Ambulatory Visit: Payer: Self-pay | Admitting: Pharmacist

## 2023-07-28 NOTE — Progress Notes (Signed)
     Name: [name]           MRN: [id number]       DOB: [birth year]  Call placed to patient (both phone numbers on file) with no answer.  Would like to check on FreeStyle libre 3 CGM PLUS system.  It appears to have been filled on 07/14/23 at [facility].  Will aim to provide education on CGM system   Kieth Brightly, PharmD, [place], CPP Clinical Pharmacist, [facility]

## 2023-08-01 DIAGNOSIS — E1142 Type 2 diabetes mellitus with diabetic polyneuropathy: Secondary | ICD-10-CM | POA: Diagnosis not present

## 2023-08-01 DIAGNOSIS — L97511 Non-pressure chronic ulcer of other part of right foot limited to breakdown of skin: Secondary | ICD-10-CM | POA: Diagnosis not present

## 2023-08-15 DIAGNOSIS — L97511 Non-pressure chronic ulcer of other part of right foot limited to breakdown of skin: Secondary | ICD-10-CM | POA: Diagnosis not present

## 2023-08-22 NOTE — Progress Notes (Signed)
error 

## 2023-08-31 ENCOUNTER — Ambulatory Visit: Payer: Medicare HMO | Admitting: Family Medicine

## 2023-09-01 ENCOUNTER — Telehealth: Payer: Self-pay | Admitting: Pharmacist

## 2023-09-01 ENCOUNTER — Ambulatory Visit (INDEPENDENT_AMBULATORY_CARE_PROVIDER_SITE_OTHER): Payer: Medicare HMO | Admitting: Family Medicine

## 2023-09-01 VITALS — BP 115/57 | HR 89 | Temp 97.5°F | Ht 75.0 in | Wt 246.0 lb

## 2023-09-01 DIAGNOSIS — E1142 Type 2 diabetes mellitus with diabetic polyneuropathy: Secondary | ICD-10-CM | POA: Diagnosis not present

## 2023-09-01 DIAGNOSIS — E782 Mixed hyperlipidemia: Secondary | ICD-10-CM | POA: Diagnosis not present

## 2023-09-01 DIAGNOSIS — E1169 Type 2 diabetes mellitus with other specified complication: Secondary | ICD-10-CM

## 2023-09-01 DIAGNOSIS — Z23 Encounter for immunization: Secondary | ICD-10-CM

## 2023-09-01 DIAGNOSIS — Z7984 Long term (current) use of oral hypoglycemic drugs: Secondary | ICD-10-CM

## 2023-09-01 DIAGNOSIS — I10 Essential (primary) hypertension: Secondary | ICD-10-CM

## 2023-09-01 DIAGNOSIS — I739 Peripheral vascular disease, unspecified: Secondary | ICD-10-CM

## 2023-09-01 DIAGNOSIS — I5022 Chronic systolic (congestive) heart failure: Secondary | ICD-10-CM | POA: Diagnosis not present

## 2023-09-01 DIAGNOSIS — E1159 Type 2 diabetes mellitus with other circulatory complications: Secondary | ICD-10-CM

## 2023-09-01 MED ORDER — SERTRALINE HCL 50 MG PO TABS: 50.0000 mg | ORAL_TABLET | Freq: Every day | ORAL | 6 refills | Status: DC

## 2023-09-01 MED ORDER — OXYCODONE-ACETAMINOPHEN 10-325 MG PO TABS: ORAL_TABLET | ORAL | 0 refills | Status: DC

## 2023-09-01 MED ORDER — ATORVASTATIN CALCIUM 10 MG PO TABS: ORAL_TABLET | ORAL | 2 refills | Status: DC

## 2023-09-01 MED ORDER — MEMANTINE HCL 5 MG PO TABS: 5.0000 mg | ORAL_TABLET | Freq: Two times a day (BID) | ORAL | 1 refills | Status: DC

## 2023-09-01 MED ORDER — VALSARTAN 40 MG PO TABS: 40.0000 mg | ORAL_TABLET | Freq: Every day | ORAL | 1 refills | Status: DC

## 2023-09-01 MED ORDER — MUPIROCIN 2 % EX OINT: TOPICAL_OINTMENT | Freq: Every day | CUTANEOUS | 2 refills | Status: DC

## 2023-09-01 MED ORDER — METFORMIN HCL ER 500 MG PO TB24
500.0000 mg | ORAL_TABLET | Freq: Every day | ORAL | 1 refills | Status: DC
Start: 2023-09-01 — End: 2024-01-29

## 2023-09-01 NOTE — Telephone Encounter (Signed)
Libre 3 plus CGM Rxs sent to DME advanced diabetes supply company (per insurance)--not able to fill at local drug store  Patient will need to call [phone] Left VM explaining this to patient

## 2023-09-01 NOTE — Progress Notes (Unsigned)
Subjective:    Patient ID: [name], male    DOB: Mar 02, 1956, 67 y.o.   MRN: [id number]  Discussed the use of AI scribe software for clinical note transcription with the patient, who gave verbal consent to proceed.  History of Present Illness   The patient, with a history of chronic pain and mobility issues, presents with a recent fall from a lawn mower, resulting in lower back pain and bruising. The fall was backwards off the lawn mower, landing on the lower back. The patient reports that the pain has been improving, but still experiences discomfort when moving around.  In addition to the fall, the patient has been experiencing constipation, which he reports has worsened since the fall. The patient describes difficulty in passing hard, firm stools, despite taking Dulcolax.  The patient also reports a persistent callus on the foot that causes constant pain. He has been seeing Dr. Nolen Mu for this issue, who has been applying collagen to the area. However, the patient has noticed a new area of concern on the foot.  The patient's spouse has also noted swelling in the patient's leg, which has been present for approximately three months. The patient reports no pain in the leg, but does experience pain in the foot where the callus is located.  The patient's cognitive function has been noted to be declining, with occasional lapses in memory. However, the patient is still able to perform daily activities and engage in conversation. The patient's spouse has expressed concern about the patient's safety when performing certain tasks, such as taking out the trash.  The patient's blood work is due to be completed in the next 30 days. The patient has not yet received a flu shot for the current year.       This patient was seen today for chronic pain  The medication list was reviewed and updated.   Location of Pain for which the patient has been treated with regarding narcotics: ***  Onset of  this pain: ***   -Compliance with medication: ***  - Number patient states they take daily: ***  -Reason for ongoing use of opioids ***  What other measures have been tried outside of opioids ***  In the ongoing specialists regarding this condition ***  -when was the last dose patient took? ***  The patient was advised the importance of maintaining medication and not using illegal substances with these.  Here for refills and follow up  The patient was educated that we can provide 3 monthly scripts for their medication, it is their responsibility to follow the instructions.  Side effects or complications from medications: ***  Patient is aware that pain medications are meant to minimize the severity of the pain to allow their pain levels to improve to allow for better function. They are aware of that pain medications cannot totally remove their pain.  Due for UDT ( at least once per year) (pain management contract is also completed at the time of the UDT): ***  Scale of 1 to 10 ( 1 is least 10 is most) Your pain level without the medicine: *** Your pain level with medication ***  Scale 1 to 10 ( 1-helps very little, 10 helps very well) How well does your pain medication reduce your pain so you can function better through out the day? ***  Quality of the pain: ***  Persistence of the pain: ***  Modifying factors: ***       Review of Systems  Objective:    Physical Exam   HEENT: Calcium nodule on scalp. EXTREMITIES: Leg edema. MUSCULOSKELETAL: Painful callus on foot. SKIN: Bruising and abrasion on lower back.   Diabetic partial amputation along with ulcer on the foot noted  {Labs (Optional):23779}     Assessment & Plan:  Assessment and Plan    Lower Back Pain   Recent fall off a lawnmower resulting in lower back pain and bruising. Pain is improving but still present with movement.   -Continue to monitor and manage pain at home.    Constipation    Reports of hard stools and difficulty passing them. Currently taking Dulcolax.   -Continue Dulcolax.   -Provide additional suggestions for managing constipation.   -If constipation persists over the next 3-4 weeks, he is to inform the office.    Foot Pain and Callus   Ongoing foot pain due to a callus. Regular follow-ups with Dr. Nolen Mu.   -Continue current treatment plan with Dr. Nolen Mu.   -Next appointment with Dr. Nolen Mu in two weeks.    Lower Extremity Edema   Chronic swelling in the lower extremity for the past three months. No signs of infection.   -Continue to monitor for signs of infection or worsening swelling.   -Consider evaluation for potential blood clots if condition worsens.    General Health Maintenance   -Order blood work to be completed within the next 30 days.   -Administer influenza vaccine today.   -Schedule follow-up appointment in three months.      Diabetic foot ulcer will need follow-up with foot doctor within the next couple weeks he has appointment  The patient was seen in followup for chronic pain. A review over at their current pain status was discussed. Drug registry was checked. Prescriptions were given.  Regular follow-up recommended. Discussion was held regarding the importance of compliance with medication as well as pain medication contract.  Patient was informed that medication may cause drowsiness and should not be combined  with other medications/alcohol or street drugs. If the patient feels medication is causing altered alertness then do not drive or operate dangerous equipment.  Should be noted that the patient appears to be meeting appropriate use of opioids and response.  Evidenced by improved function and decent pain control without significant side effects and no evidence of overt aberrancy issues.  Upon discussion with the patient today they understand that opioid therapy is optional and they feel that the pain has been refractory to  reasonable conservative measures and is significant and affecting quality of life enough to warrant ongoing therapy and wishes to continue opioids.  Refills were provided.  [facility] medical Board guidelines regarding the pain medicine has been reviewed.  CDC guidelines most updated 2022 has been reviewed by the prescriber.  PDMP is checked on a regular basis yearly urine drug screen and pain management contract

## 2023-09-05 DIAGNOSIS — L97511 Non-pressure chronic ulcer of other part of right foot limited to breakdown of skin: Secondary | ICD-10-CM | POA: Diagnosis not present

## 2023-09-05 DIAGNOSIS — E1142 Type 2 diabetes mellitus with diabetic polyneuropathy: Secondary | ICD-10-CM | POA: Diagnosis not present

## 2023-09-14 IMAGING — MR MR HEAD W/O CM
13 of 14 series · 37 of 48 positions shown · non-contrast
Comparison: MRI brain and MRA head 09/18/2018.

CLINICAL DATA: Provided history: Short-term memory loss. Memory
loss; short-term memory loss.

EXAM:
MRI HEAD WITHOUT CONTRAST
TECHNIQUE: Multiplanar, multiecho pulse sequences of the brain and surrounding
structures were obtained without intravenous contrast.

[Series 5: DWI · axial · 3.0mm · 0.77mm/px · z∈[-44,+96]mm · 3 of 48 slices shown (1 of 6)]
[im 1/48]
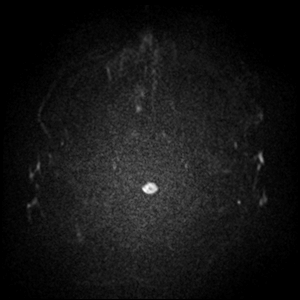
[im 24/48]
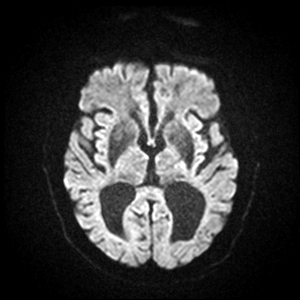
[im 48/48]
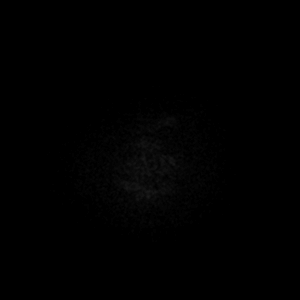

[Series 5: DWI · axial · 3.0mm · 0.77mm/px · z∈[-44,+96]mm · 3 of 48 slices shown (2 of 6)]
[im 1/48]
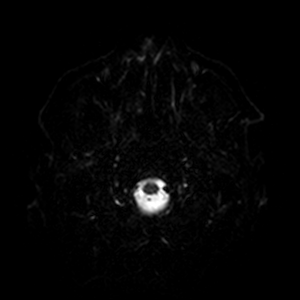
[im 24/48]
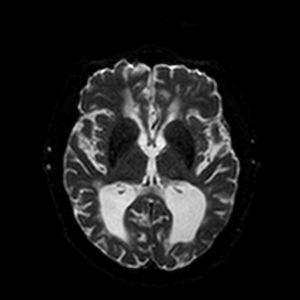
[im 48/48]
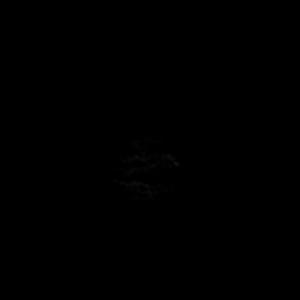

[Series 6: DWI · axial · 3.0mm · 0.77mm/px · z∈[-44,+96]mm · 4 of 48 slices shown (3 of 6)]
[im 1/48]
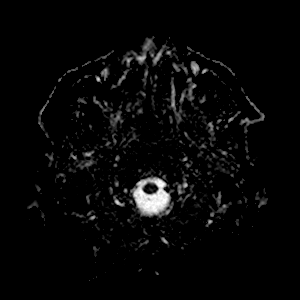
[im 16/48]
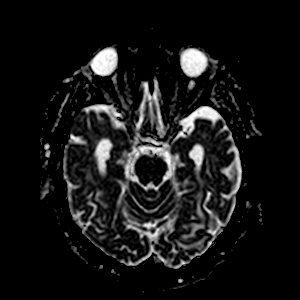
[im 32/48]
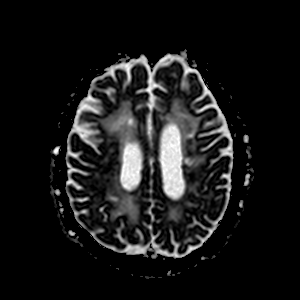
[im 48/48]
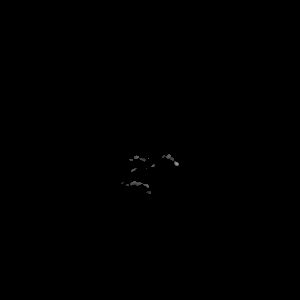

[Series 7: DWI · coronal · 5.0mm · 0.88mm/px · 2 of 29 slices shown (4 of 6)]
[im 1/29]
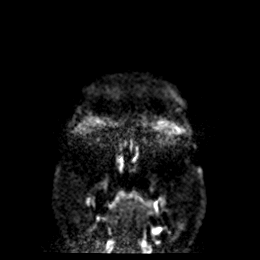
[im 29/29]
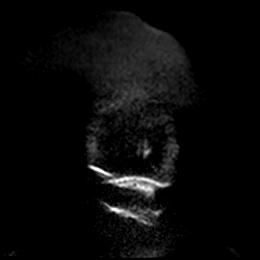

[Series 7: DWI · coronal · 5.0mm · 0.88mm/px · 2 of 29 slices shown (5 of 6)]
[im 1/29]
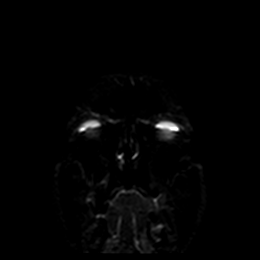
[im 29/29]
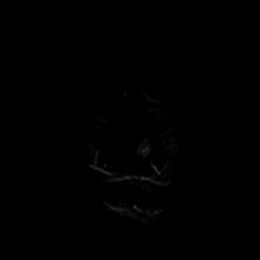

[Series 8: DWI · coronal · 5.0mm · 0.88mm/px · 2 of 29 slices shown (6 of 6)]
[im 1/29]
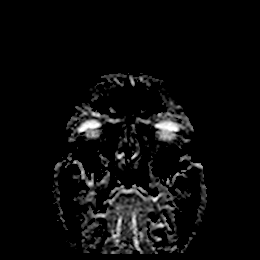
[im 29/29]
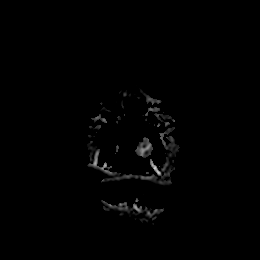

[Series 9: T1 · sagittal · 5.0mm · 0.75mm/px · 2 of 21 slices shown]
[im 1/21]
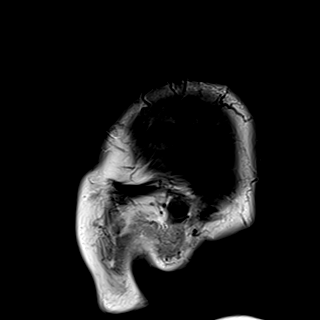
[im 21/21]
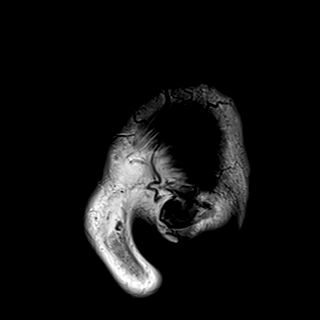

[Series 10: T2 · axial · 5.0mm · 0.72mm/px · 1 of 20 slices shown (1 of 2)]
[im 1/20]
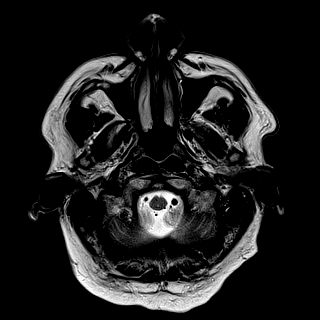

[Series 11: mag_images · axial · 3.0mm · 0.90mm/px · z∈[-47,+105]mm · 4 of 52 slices shown]
[im 1/52]
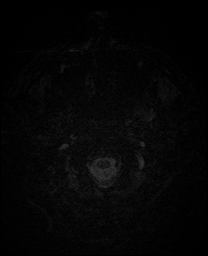
[im 18/52]
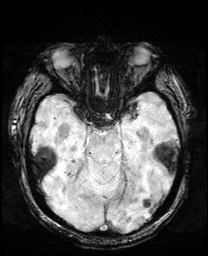
[im 35/52]
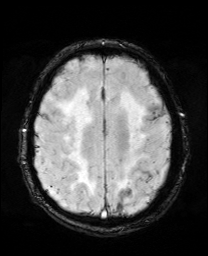
[im 52/52]
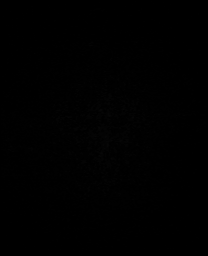

[Series 12: pha_images · axial · 3.0mm · 0.90mm/px · z∈[-47,+96]mm · 4 of 49 slices shown]
[im 1/49]
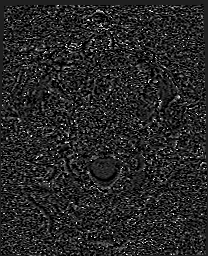
[im 17/49]
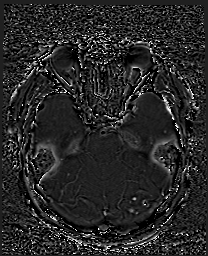
[im 33/49]
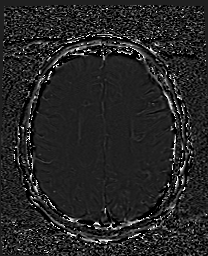
[im 49/49]
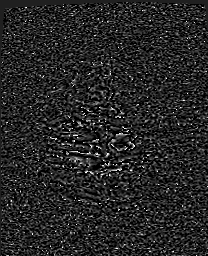

[Series 13: swi_images · axial · 3.0mm · 0.90mm/px · z∈[-47,+105]mm · 4 of 52 slices shown]
[im 1/52]
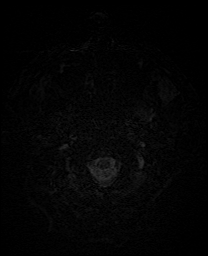
[im 18/52]
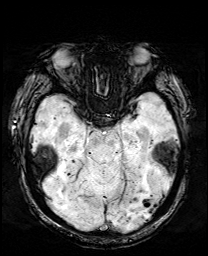
[im 35/52]
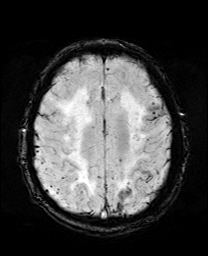
[im 52/52]
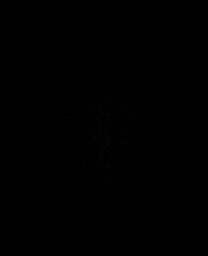

[Series 15: FLAIR · axial · 3.0mm · 0.45mm/px · z∈[-42,+100]mm · 4 of 49 slices shown]
[im 1/49]
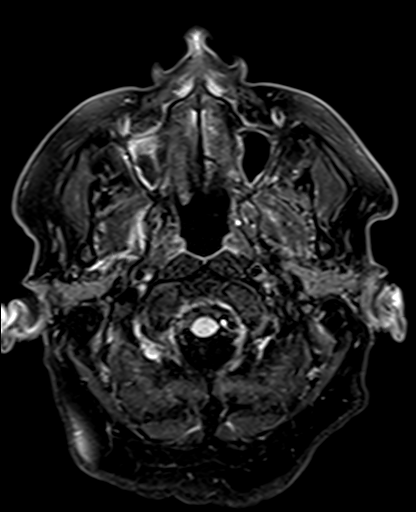
[im 17/49]
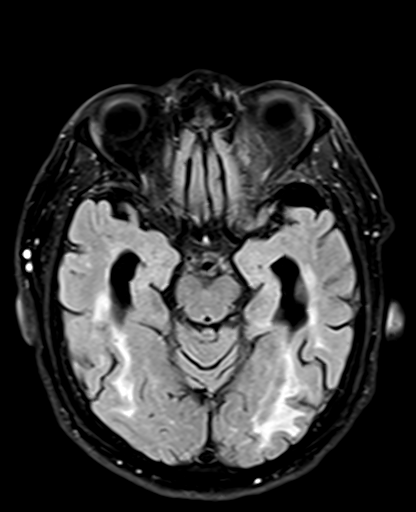
[im 33/49]
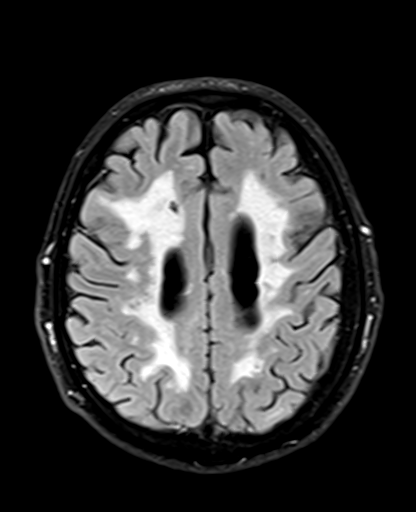
[im 49/49]
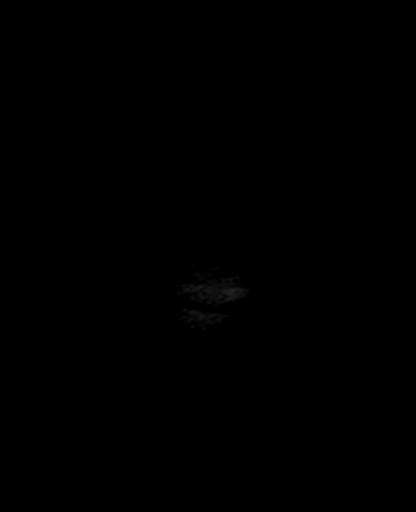

[Series 17: T2 · coronal · 5.0mm · 0.72mm/px · 2 of 28 slices shown (2 of 2)]
[im 1/28]
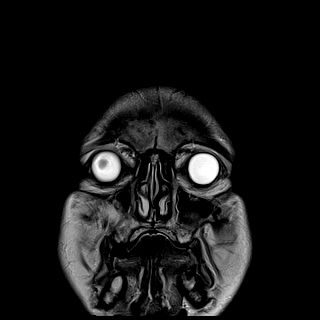
[im 28/28]
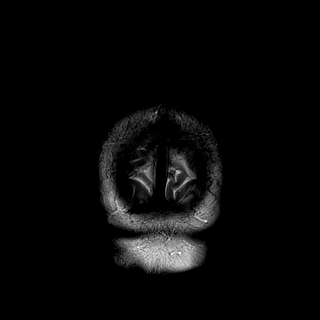

[37 of 48 positions shown; findings below may reference images not displayed]

FINDINGS: Brain:

Central predominant cerebral atrophy, most notably affecting the
temporal and occipital lobes.

Punctate acute infarcts within the high left parietal lobe and
posterior left temporal lobe (3 total) (series 5, images 40, 21 and
18).

Multiple chronic small-vessel infarcts within the bilateral cerebral
hemispheric white matter, progressed in number from the prior brain
MRI of 09/18/2018.

Background advanced patchy and confluent T2 FLAIR hyperintense
signal abnormality within the cerebral white matter, nonspecific but
compatible with chronic small vessel ischemic disease. These
findings have also significantly progressed from the prior MRI.

Numerous chronic parenchymal microhemorrhages with a supratentorial
and lobar predominance. As before, there is relative sparing of the
deep gray nuclei, brainstem and cerebellum. Redemonstrated somewhat
larger (but subcentimeter) chronic hemorrhage within the left
parietal lobe.

Small arachnoid cyst within the anterior left middle cranial fossa.

No definite cortical encephalomalacia is identified.

No evidence of an intracranial mass.

No extra-axial fluid collection.

No midline shift.

Vascular: Maintained flow voids within the proximal large arterial
vessels.

Skull and upper cervical spine: No focal suspicious marrow lesion.
Incompletely assessed cervical spondylosis.

Sinuses/Orbits: No mass or acute finding within the imaged orbits.
No significant paranasal sinus disease.

Other: Trace fluid within left mastoid air cells.

Impression #1 will be called to the ordering clinician or
representative by the Radiologist Assistant, and communication
documented in the PACS or [REDACTED].
IMPRESSION: Punctate acute infarcts within the high left parietal and posterior
left temporal lobes (3 total).

Multiple chronic small-vessel infarcts within bilateral cerebral
hemispheric white matter, progressed in number from the prior MRI of
09/18/2018. Background severe cerebral white matter chronic small
vessel ischemic disease, also significantly progressed.

Numerous chronic parenchymal microhemorrhages with a supratentorial
and lobar predominance. As before, there is relative sparing of the
deep gray nuclei, brainstem and cerebellum. The distribution is
suggestive of cerebral amyloid angiopathy.

Central predominant atrophy, most notably affecting the temporal and
occipital lobes.

## 2023-09-19 ENCOUNTER — Telehealth: Payer: Self-pay | Admitting: Pharmacist

## 2023-09-19 NOTE — Progress Notes (Signed)
    Unable to reach patient x2.  VM left at [phone] encouraging patient to call [employer] to have his Libre CGM shipped to his house.  They have tried multiple times to get in contact with him.  Patient needs to call  [phone].  Kieth Brightly, PharmD, BCACP, CPP Clinical Pharmacist, [facility]

## 2023-09-26 DIAGNOSIS — L97512 Non-pressure chronic ulcer of other part of right foot with fat layer exposed: Secondary | ICD-10-CM | POA: Diagnosis not present

## 2023-09-29 IMAGING — DX DG CHEST 1V PORT
1 series · 1 of 1 positions shown · non-contrast
Comparison: Chest x-ray 09/18/2018

CLINICAL DATA: ams

EXAM:
PORTABLE CHEST 1 VIEW

[chest ap]
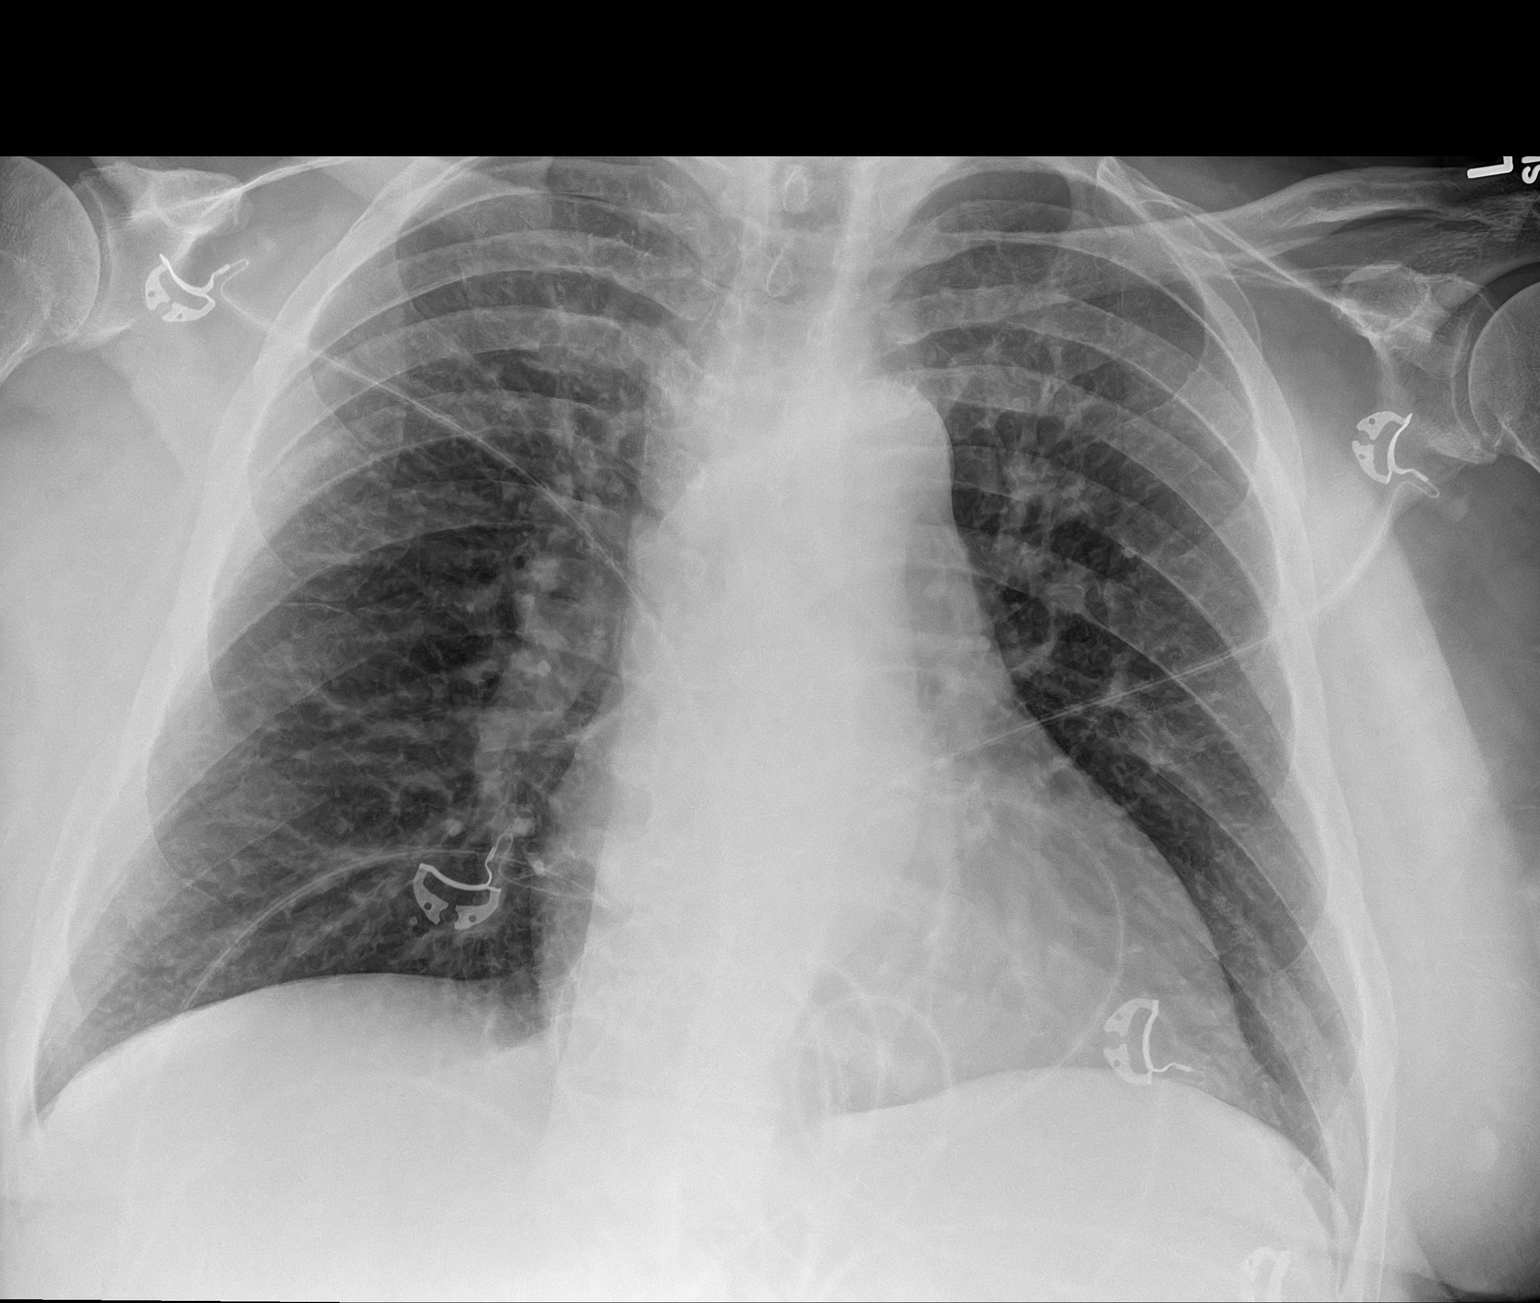

[1 of 1 positions shown; findings below may reference images not displayed]

FINDINGS: The heart and mediastinal contours are unchanged.

No focal consolidation. No pulmonary edema. No pleural effusion. No
pneumothorax.

No acute osseous abnormality.
IMPRESSION: No active disease.

## 2023-09-30 IMAGING — US US SCROTUM W/ DOPPLER COMPLETE
1 series · 14 of 25 positions shown · non-contrast
Comparison: None Available.

CLINICAL DATA: Left scrotal pain and swelling for the past 2 days.

EXAM:
SCROTAL ULTRASOUND
DOPPLER ULTRASOUND OF THE TESTICLES
TECHNIQUE: Complete ultrasound examination of the testicles, epididymis, and
other scrotal structures was performed. Color and spectral Doppler
ultrasound were also utilized to evaluate blood flow to the
testicles.

[Series 1: us scrotum w/doppler · 14 of 60 slices shown]
[im 1/60]
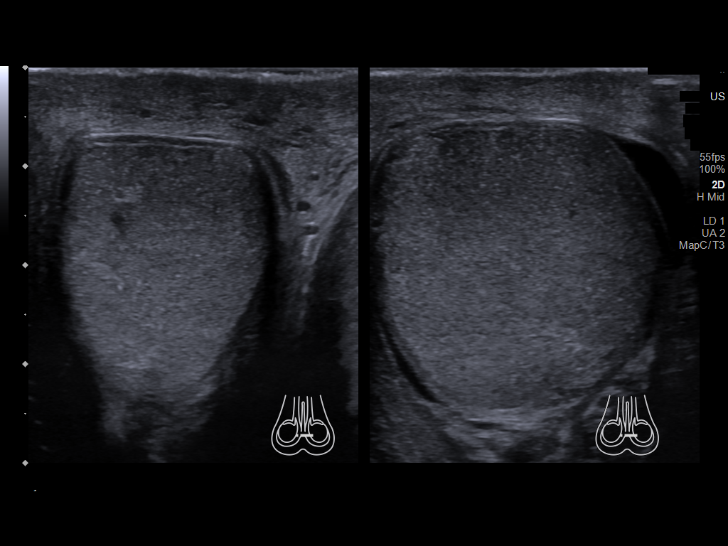
[im 5/60]
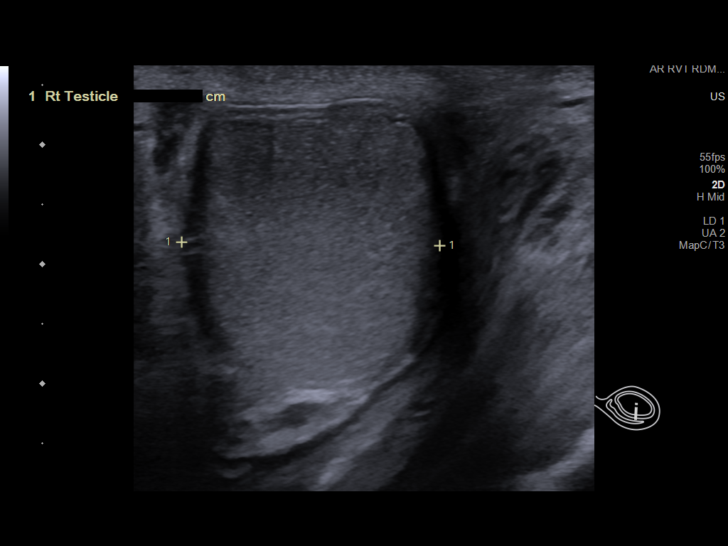
[im 10/60]
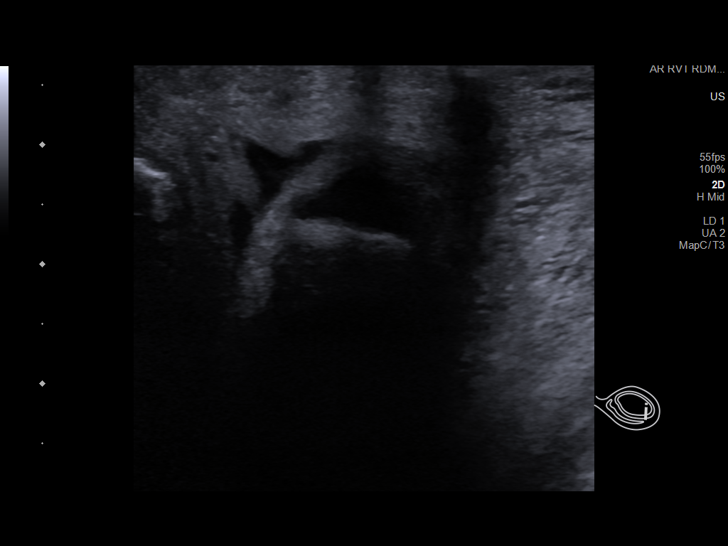
[im 15/60]
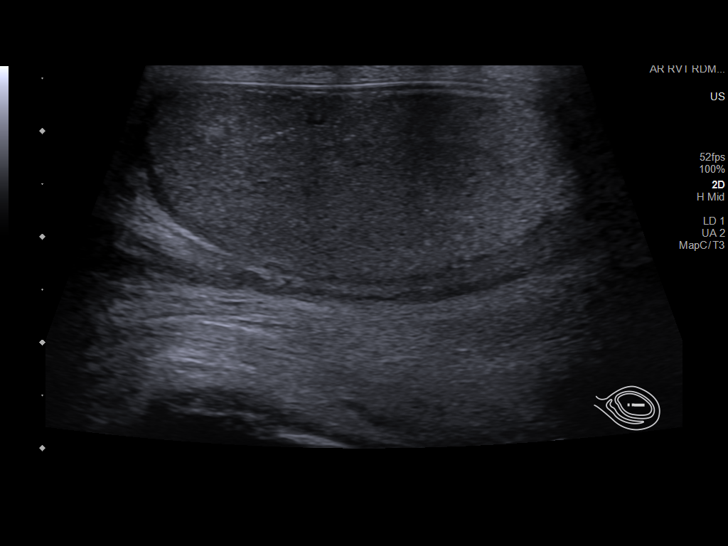
[im 20/60]
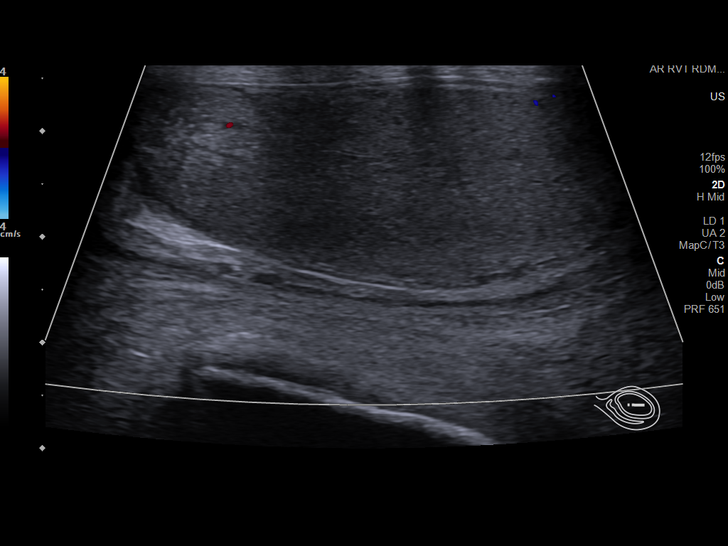
[im 23/60]
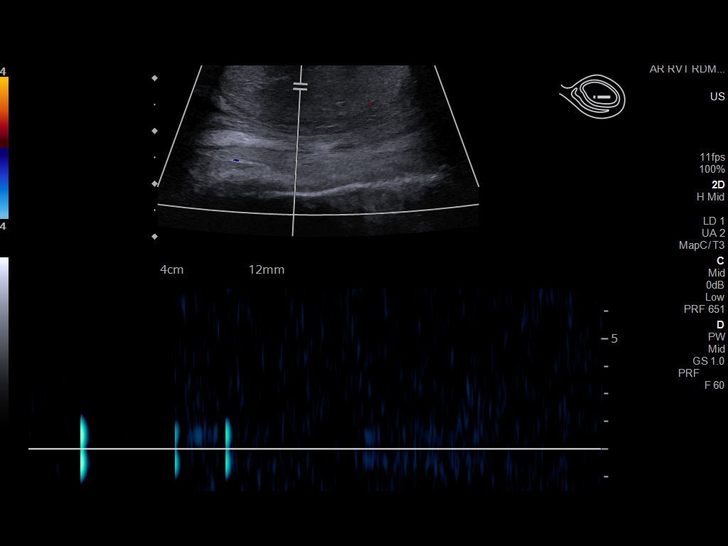
[im 28/60]
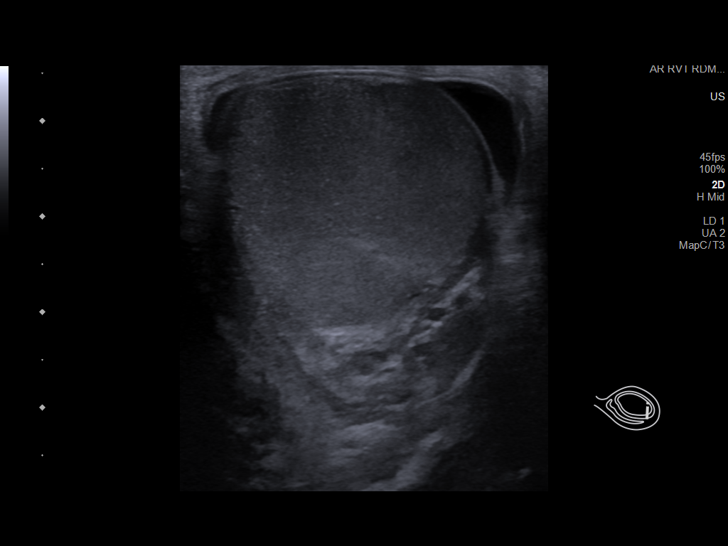
[im 32/60]
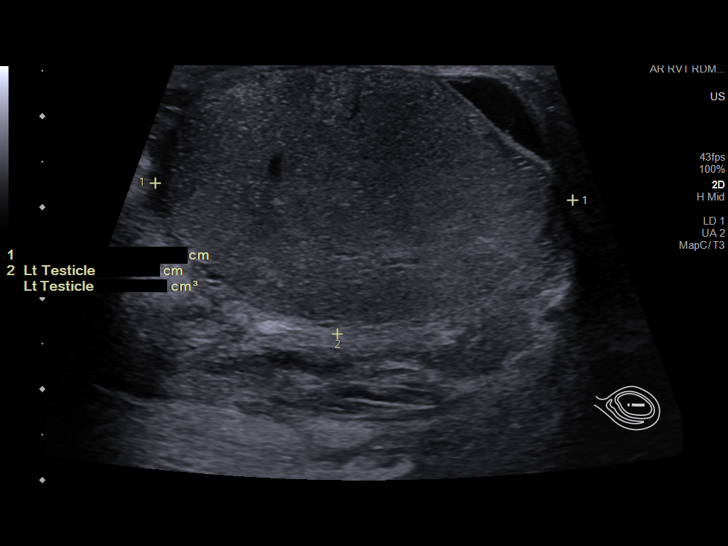
[im 37/60]
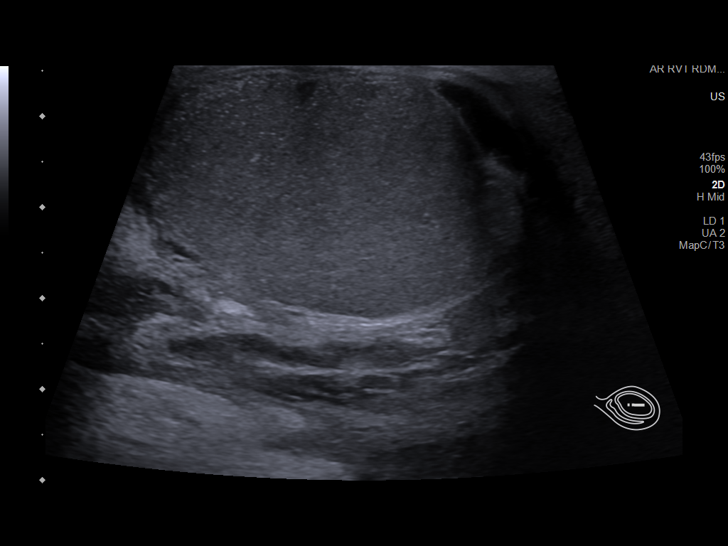
[im 40/60]
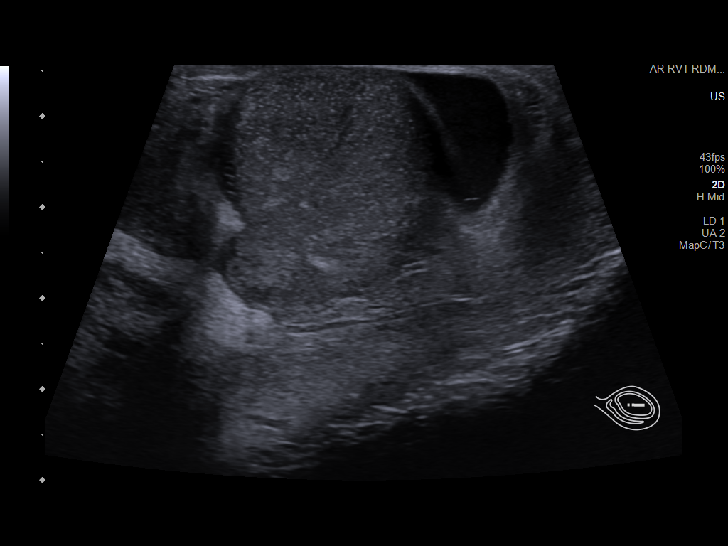
[im 45/60]
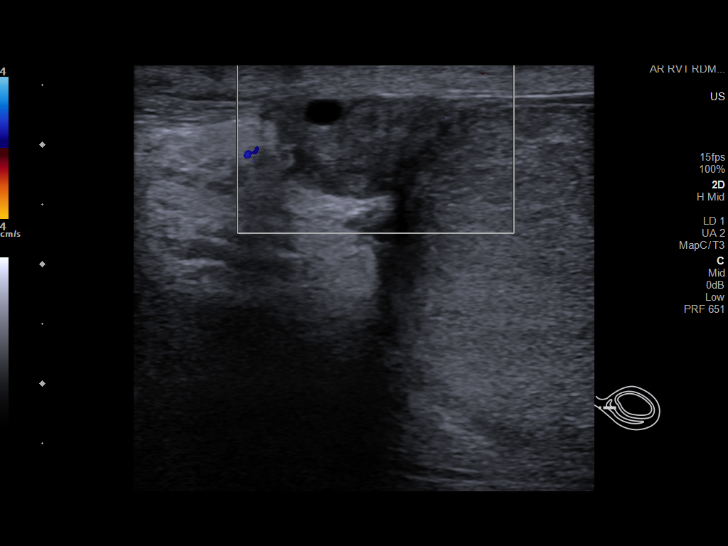
[im 50/60]
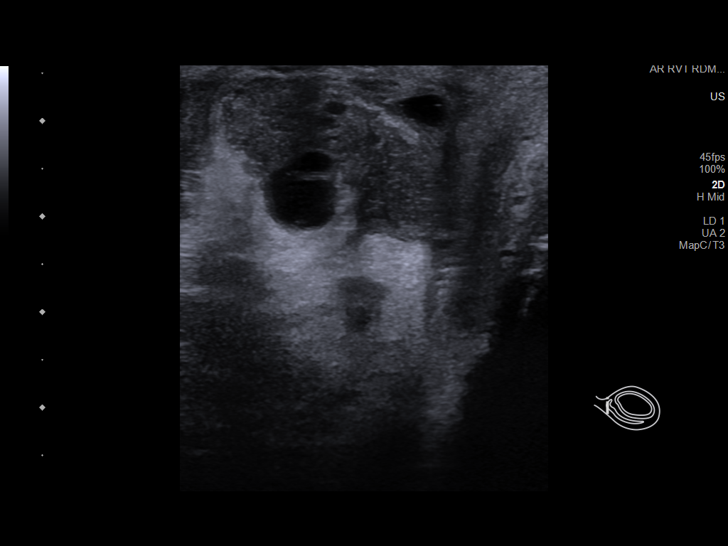
[im 55/60]
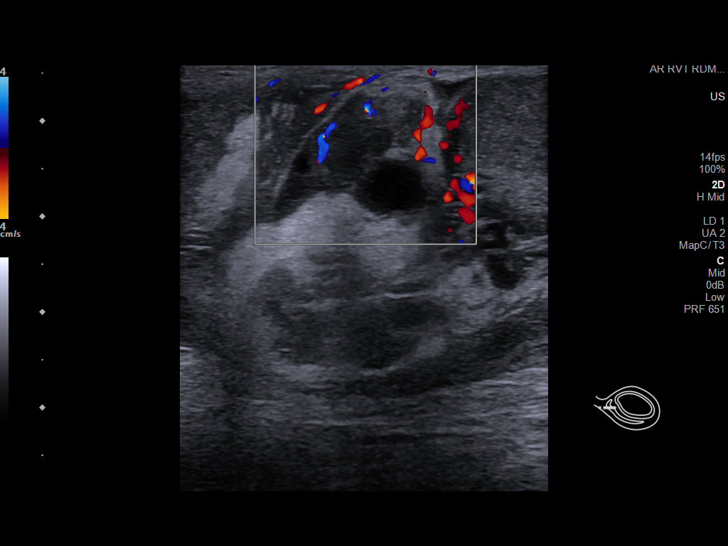
[im 60/60]
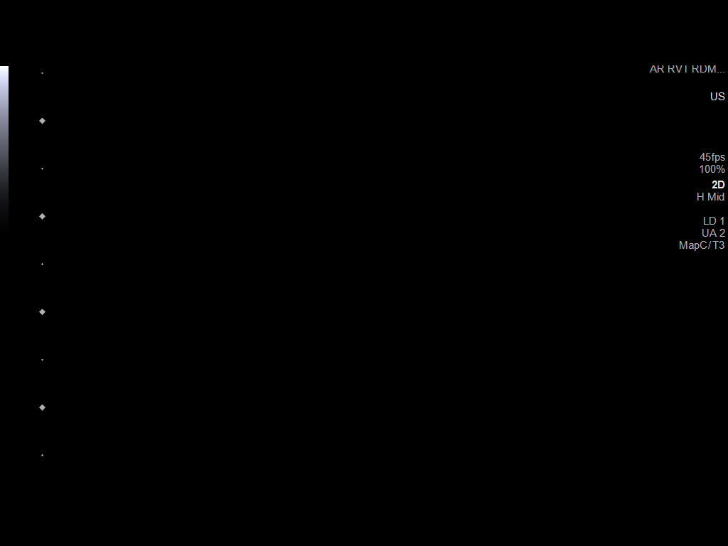

[14 of 25 positions shown; findings below may reference images not displayed]

FINDINGS: Right testicle

Measurements: 4.6 x 2.5 x 2.2 cm. No mass or microlithiasis
visualized.

Left testicle

Measurements: 4.6 x 3.0 x 3.2 cm. Heterogeneous appearance. No mass
or microlithiasis visualized. Asymmetrically increased vascularity
compared to the right.

Right epididymis: Normal in size and appearance. 6 mm epididymal
head cyst.

Left epididymis: Asymmetrically increased vascularity compared to
the right. 9 mm epididymal head cyst.

Hydrocele:  Small left greater than right hydroceles.

Varicocele:  None visualized.

Pulsed Doppler interrogation of both testes demonstrates normal low
resistance arterial and venous waveforms bilaterally.
IMPRESSION: 1. Findings most consistent with left epididymo-orchitis.

## 2023-10-03 ENCOUNTER — Other Ambulatory Visit: Payer: Self-pay | Admitting: Family Medicine

## 2023-10-03 DIAGNOSIS — L97512 Non-pressure chronic ulcer of other part of right foot with fat layer exposed: Secondary | ICD-10-CM | POA: Diagnosis not present

## 2023-10-03 DIAGNOSIS — E1142 Type 2 diabetes mellitus with diabetic polyneuropathy: Secondary | ICD-10-CM | POA: Diagnosis not present

## 2023-10-03 DIAGNOSIS — I739 Peripheral vascular disease, unspecified: Secondary | ICD-10-CM | POA: Diagnosis not present

## 2023-10-03 DIAGNOSIS — B351 Tinea unguium: Secondary | ICD-10-CM | POA: Diagnosis not present

## 2023-10-03 DIAGNOSIS — L84 Corns and callosities: Secondary | ICD-10-CM | POA: Diagnosis not present

## 2023-10-04 DIAGNOSIS — L97512 Non-pressure chronic ulcer of other part of right foot with fat layer exposed: Secondary | ICD-10-CM | POA: Diagnosis not present

## 2023-11-01 ENCOUNTER — Encounter (HOSPITAL_BASED_OUTPATIENT_CLINIC_OR_DEPARTMENT_OTHER): Payer: Medicare HMO | Admitting: General Surgery

## 2023-11-21 DIAGNOSIS — L03115 Cellulitis of right lower limb: Secondary | ICD-10-CM | POA: Diagnosis not present

## 2023-11-21 DIAGNOSIS — E1142 Type 2 diabetes mellitus with diabetic polyneuropathy: Secondary | ICD-10-CM | POA: Diagnosis not present

## 2023-11-21 DIAGNOSIS — L97512 Non-pressure chronic ulcer of other part of right foot with fat layer exposed: Secondary | ICD-10-CM | POA: Diagnosis not present

## 2023-11-24 DIAGNOSIS — E1165 Type 2 diabetes mellitus with hyperglycemia: Secondary | ICD-10-CM | POA: Diagnosis not present

## 2023-12-01 ENCOUNTER — Ambulatory Visit: Payer: Self-pay | Admitting: Family Medicine

## 2023-12-01 NOTE — Telephone Encounter (Signed)
Chief Complaint: dementia Symptoms: worsening dementia and agitation Frequency: ongoing, worse the last few weeks Pertinent Negatives: Patient denies fever, dysuria Disposition: [] ED /[] Urgent Care (no appt availability in office) / [x] Appointment(In office/virtual)/ []  [place] [facility]/ [] Home Care/ [x] Refused Recommended Disposition /[] [facility] [place] [facility]/ []  Follow-up with PCP Additional Notes: Wife Steward Drone calls in about the pt. Wife states pt's dementia is worsening and his behavior is becoming more problematic. [place]. Pt likes to take the lawnmower in the backyard and up to the road. Yesterday pt was insistent on using the lawnmower and "took the handles and reversed and the mower did a 360 and he went on down the hill." Wife states he did not fall off. Wife does state he has been falling more frequently but he has not hit his head.   Additionally, wife states pt is very stubborn and argumentative. States he says no to her frequently when she gives him instruction. States that he won't wear briefs and has been having more accidents because it is hard for him to get up to the bathroom. Wife states he has an infection on his R foot (currently taking antibiotics) and his R knee needs to be operated on but that has been delayed. Wife states that is why he has a hard time getting up to use the bathroom. Wife states he urinates frequently but denies that he complains of dysuria or hematuria. Wife states his behavior worsens in the afternoons and that he becomes difficult. Wife states pt "swings at her" sometimes when he becomes agitated and she tells him what to do, but states she does not feel unsafe currently. At this time, she states pt is sitting at the kitchen table.  RN advised wife pt should be seen within 24 hours. Dr. Gerda Diss has no availability today and neither do the other providers at the office. RN advised wife RN can schedule  an appt with a different office for today. Wife stated she does not want to take pt to a different office because she has "a lot going on."  This RN called the [facility]. Autumn via [facility] said that the patient can be scheduled on Monday with a different provider since Dr. Gerda Diss is OOO until Wednesday, but warned RN the pt is unlikely to be agreeable to see a different doctor. RN got back on the phone with wife and offered wife and pt a Monday appt with a different provider. Wife put pt on speaker phone and pt refused to come in on Monday to see someone else. Pt said he only wants to see Dr. Gerda Diss. Despite RN's attempts, RN was ultimately unable to sway pt to become agreeable to come into the office on Monday.  RN advised wife that if pt becomes aggressive with her or another family member, or if patient uses the lawnmower again or puts himself in a dangerous situation in which his own safety is compromised, she needs to call 911 and have EMS come out to the house. Wife agreed and verbalized understanding. Of note, pt does have an office visit with Dr. Gerda Diss on 2/21.  Copied from CRM [id number]. Topic: Clinical - Red Word Triage >> Dec 01, 2023 10:29 AM Martha Clan wrote: Red Word that prompted transfer to Nurse Triage: Dementia, trying to run away on a lawn mower. Getting mad, swinging. Wife on the line. Reason for Disposition  [1] Worsening confusion AND [2] gradual onset (days to  weeks)  Answer Assessment - Initial Assessment Questions 1. MAIN CONCERN OR SYMPTOM:  "What is your main concern right now?" "What questions do you have?" "What's the main symptom you're worried about?" (e.g., confusion, memory loss)     Yesterday pt got on lawnmower. Wife states [place] a [place]side/farm, he went out back to his trailer/around the yard. "Yesterday he wasn't thinking right." "Went off the lawnmower backwards"  "Took the handles and reversed and mower made a 360 and went on down the hill." Wife states he did not fall  off.  "He's out of control", "He can't dress himself anymore", "He has accidents and won't wear Depends." "Gets worse in the afternoons." Wife states pt states he "gets around" with the lawnmower because they live on a farm but right now its very muddy. Wife states pt was swinging at her a "few days ago" because she was asking him to do something he did not want to do. Wife states right now he is not violent. Patient is currently sitting at the kitchen table. 2. ONSET:  "When did the symptom start (or worsen)?" (minutes, hours, days, weeks)     Hx of dementia, worsening. 3. BETTER-SAME-WORSE: "Are you (the patient) getting better, staying the same, or getting worse compared to the day you (they) were diagnosed or most recent hospital discharge ?"     Worse  4. DIAGNOSIS: "Was the dementia diagnosed by a doctor?" If Yes, ask: "When?" (e.g., days, months, years ago)     Yes 5. MEDICINES: "Has there been any change in medicines recently?" (e.g., narcotics, antihistamines, benzodiazepines, etc.)     No 6. OTHER SYMPTOMS: "Are there any other symptoms?" (e.g., fever, cough, pain, falling)     Foot infection on antibiotics (one more left). No fever. He is "constantly going to the bathroom to urinate" but wife denies that he has burning with urination/hematuria. States this is unchanged. "He will have a BM too and he has not made it the bathroom a number of times" - wife states that is not getting worse, it's the same. Wife states that happens because he can barely walk d/t foot infection and "bad knee" both on the R leg ("was going to [facility] to have his knee down but he had a stroke and his A1C was high so he didn't get the surgery"). More frequent falls, grandkids had to pick him up. No new one-sided weakness/paralysis (hx of stroke) but has a hard time getting out of a chair d/t leg. Stubborn/argumentative, always says no to everything. Currently sitting at the kitchen table. Wife states pt is a "danger to her  sometimes" but she says she feels safe. States he is not a danger to himself at this current moment. Slurred speech for a few days "when he gets upset"/agitated/has difficulty explaining himself but wife states this is not worse than his normal. 7. SUPPORT: Document living circumstances and support (e.g., family, nursing home)     Lives with wife, grandson helps  Protocols used: Dementia Symptoms and Questions-A-AH

## 2023-12-05 DIAGNOSIS — L97512 Non-pressure chronic ulcer of other part of right foot with fat layer exposed: Secondary | ICD-10-CM | POA: Diagnosis not present

## 2023-12-05 DIAGNOSIS — E1142 Type 2 diabetes mellitus with diabetic polyneuropathy: Secondary | ICD-10-CM | POA: Diagnosis not present

## 2023-12-08 ENCOUNTER — Ambulatory Visit: Payer: Medicare HMO | Admitting: Family Medicine

## 2023-12-13 ENCOUNTER — Ambulatory Visit (INDEPENDENT_AMBULATORY_CARE_PROVIDER_SITE_OTHER): Payer: Medicare HMO | Admitting: Family Medicine

## 2023-12-13 ENCOUNTER — Encounter: Payer: Self-pay | Admitting: Family Medicine

## 2023-12-13 VITALS — BP 113/66 | HR 89 | Temp 98.1°F | Ht 75.0 in | Wt 247.0 lb

## 2023-12-13 DIAGNOSIS — L089 Local infection of the skin and subcutaneous tissue, unspecified: Secondary | ICD-10-CM

## 2023-12-13 DIAGNOSIS — Z125 Encounter for screening for malignant neoplasm of prostate: Secondary | ICD-10-CM | POA: Diagnosis not present

## 2023-12-13 DIAGNOSIS — F0150 Vascular dementia without behavioral disturbance: Secondary | ICD-10-CM

## 2023-12-13 DIAGNOSIS — E11628 Type 2 diabetes mellitus with other skin complications: Secondary | ICD-10-CM | POA: Diagnosis not present

## 2023-12-13 DIAGNOSIS — Z5181 Encounter for therapeutic drug level monitoring: Secondary | ICD-10-CM

## 2023-12-13 DIAGNOSIS — F01B4 Vascular dementia, moderate, with anxiety: Secondary | ICD-10-CM

## 2023-12-13 DIAGNOSIS — E782 Mixed hyperlipidemia: Secondary | ICD-10-CM | POA: Diagnosis not present

## 2023-12-13 DIAGNOSIS — I10 Essential (primary) hypertension: Secondary | ICD-10-CM

## 2023-12-13 DIAGNOSIS — E1159 Type 2 diabetes mellitus with other circulatory complications: Secondary | ICD-10-CM | POA: Diagnosis not present

## 2023-12-13 DIAGNOSIS — F411 Generalized anxiety disorder: Secondary | ICD-10-CM

## 2023-12-13 DIAGNOSIS — Z79891 Long term (current) use of opiate analgesic: Secondary | ICD-10-CM

## 2023-12-13 DIAGNOSIS — Z79899 Other long term (current) drug therapy: Secondary | ICD-10-CM | POA: Diagnosis not present

## 2023-12-13 DIAGNOSIS — I5022 Chronic systolic (congestive) heart failure: Secondary | ICD-10-CM | POA: Diagnosis not present

## 2023-12-13 NOTE — Progress Notes (Unsigned)
 Subjective:    Patient ID: [name], male    DOB: [birth year], 68 y.o.   MRN: [id number]  Discussed the use of AI scribe software for clinical note transcription with the patient, who gave verbal consent to proceed.  History of Present Illness   [name] "[name]" is a 68 year old male with dementia who presents with concerns about worsening cognitive function and foot issues. He is accompanied by Steward Drone, his caregiver.  He has been experiencing a progressive decline in cognitive function, characterized by memory issues and occasional frustration. He sometimes forgets how things work and recent instructions, although he can remember necessary information most of the time. He has multi-infarct dementia, attributed to his history of diabetes and hypertension. He has experienced crying spells and sadness but no significant issues with memory retention, violent behavior, or changes in his ability to think things through.  He has a longstanding issue with his foot, where a bone is protruding through the bottom, raising concerns about osteomyelitis, as he has had it before.  He is managing diabetes, which requires regular monitoring of his A1c levels.  In terms of daily activities, he spends time working on his tractor and riding his lawnmower, which he uses like a golf cart. He is able to safely ride it around and returns home without issues. However, he sometimes needs assistance with dressing and putting on shoes. He is able to bathe himself but requires reminders for other daily activities.     This patient was seen today for chronic pain  The medication list was reviewed and updated.   Location of Pain for which the patient has been treated with regarding narcotics: ***  Onset of this pain: ***   -Compliance with medication: ***  - Number patient states they take daily: ***  -Reason for ongoing use of opioids ***  What other measures have been tried outside of opioids  ***  In the ongoing specialists regarding this condition ***  -when was the last dose patient took? ***  The patient was advised the importance of maintaining medication and not using illegal substances with these.  Here for refills and follow up  The patient was educated that we can provide 3 monthly scripts for their medication, it is their responsibility to follow the instructions.  Side effects or complications from medications: ***  Patient is aware that pain medications are meant to minimize the severity of the pain to allow their pain levels to improve to allow for better function. They are aware of that pain medications cannot totally remove their pain.  Due for UDT ( at least once per year) (pain management contract is also completed at the time of the UDT): ***  Scale of 1 to 10 ( 1 is least 10 is most) Your pain level without the medicine: *** Your pain level with medication ***  Scale 1 to 10 ( 1-helps very little, 10 helps very well) How well does your pain medication reduce your pain so you can function better through out the day? ***  Quality of the pain: ***  Persistence of the pain: ***  Modifying factors: ***         Review of Systems     Objective:    Physical Exam   EXTREMITIES: Right leg cooler than left leg on palpation.     General-in no acute distress Eyes-no discharge Lungs-respiratory rate normal, CTA CV-no murmurs,RRR Extremities skin warm dry no edema Neuro grossly normal Behavior  normal, alert  {Labs (Optional):23779}     Assessment & Plan:  Assessment and Plan    Multi-infarct Dementia Progressive cognitive decline with forgetfulness, difficulty with complex tasks, and occasional frustration. No aggressive behavior. -Increase Sertraline from 50mg  to 100mg  daily. -Consult social services for assistance and potential future care planning. -Ensure caregiver has control over medication administration for safety.  Chronic  Pain Managed with daily pain medication. -Continue current pain medication regimen under caregiver's supervision.  Lower Extremity Circulation Concern for decreased circulation in the left lower extremity, increasing risk for foot complications. -Continue scheduled vascular consultation and ultrasound on 01/08/2024.  Foot Wound Concern for osteomyelitis. Scheduled for wound care and potential bone removal. -Continue with scheduled wound care and imaging in March.  Diabetes Mellitus Need to assess current glycemic control. -Order blood work including A1C.  General Health Maintenance -Return for follow-up in 4 weeks.     1. DM type 2 causing vascular disease (HCC) (Primary) *** - Microalbumin / creatinine urine ratio - Hemoglobin A1c  2. Vascular dementia without behavioral disturbance, psychotic disturbance, mood disturbance, or anxiety, unspecified dementia severity (HCC) ***  3. Chronic HFrEF (heart failure with reduced ejection fraction) (HCC) ***  4. Diabetic foot infection (HCC) ***  5. Essential hypertension, benign *** - Microalbumin / creatinine urine ratio - Basic metabolic panel  6. Moderate multi-infarct dementia with anxiety (HCC) ***  7. Mixed hyperlipidemia *** - Lipid panel  8. High risk medication use *** - Hepatic function panel  9. Screening PSA (prostate specific antigen) *** - PSA  10. GAD (generalized anxiety disorder) ***  11. Encounter for monitoring opioid maintenance therapy ***

## 2023-12-14 ENCOUNTER — Telehealth: Payer: Self-pay | Admitting: Family Medicine

## 2023-12-14 ENCOUNTER — Other Ambulatory Visit: Payer: Self-pay

## 2023-12-14 DIAGNOSIS — Z89419 Acquired absence of unspecified great toe: Secondary | ICD-10-CM

## 2023-12-14 DIAGNOSIS — F0150 Vascular dementia without behavioral disturbance: Secondary | ICD-10-CM

## 2023-12-14 DIAGNOSIS — E1159 Type 2 diabetes mellitus with other circulatory complications: Secondary | ICD-10-CM

## 2023-12-14 DIAGNOSIS — R413 Other amnesia: Secondary | ICD-10-CM

## 2023-12-14 DIAGNOSIS — I739 Peripheral vascular disease, unspecified: Secondary | ICD-10-CM

## 2023-12-14 DIAGNOSIS — E11628 Type 2 diabetes mellitus with other skin complications: Secondary | ICD-10-CM

## 2023-12-14 DIAGNOSIS — Z79899 Other long term (current) drug therapy: Secondary | ICD-10-CM

## 2023-12-14 DIAGNOSIS — I5022 Chronic systolic (congestive) heart failure: Secondary | ICD-10-CM

## 2023-12-14 MED ORDER — SERTRALINE HCL 100 MG PO TABS: 100.0000 mg | ORAL_TABLET | Freq: Every day | ORAL | 1 refills | Status: DC

## 2023-12-14 MED ORDER — OXYCODONE-ACETAMINOPHEN 10-325 MG PO TABS: ORAL_TABLET | ORAL | 0 refills | Status: DC

## 2023-12-14 NOTE — Telephone Encounter (Signed)
 Nurses Palliative care referral [facility] services and went worth Essentially this will be chronic care management for his dementia Diagnosis vascular dementia  I have spoken with his wife Steward Drone she is aware of this referral and agrees with it She would be the contact person for them to call  Thanks-Dr. Lorin Picket

## 2023-12-19 ENCOUNTER — Other Ambulatory Visit: Payer: Self-pay

## 2023-12-19 DIAGNOSIS — F0150 Vascular dementia without behavioral disturbance: Secondary | ICD-10-CM

## 2023-12-19 DIAGNOSIS — L84 Corns and callosities: Secondary | ICD-10-CM | POA: Diagnosis not present

## 2023-12-19 DIAGNOSIS — B351 Tinea unguium: Secondary | ICD-10-CM | POA: Diagnosis not present

## 2023-12-19 DIAGNOSIS — E1142 Type 2 diabetes mellitus with diabetic polyneuropathy: Secondary | ICD-10-CM | POA: Diagnosis not present

## 2023-12-19 DIAGNOSIS — L97512 Non-pressure chronic ulcer of other part of right foot with fat layer exposed: Secondary | ICD-10-CM | POA: Diagnosis not present

## 2023-12-19 DIAGNOSIS — E1151 Type 2 diabetes mellitus with diabetic peripheral angiopathy without gangrene: Secondary | ICD-10-CM | POA: Diagnosis not present

## 2023-12-19 NOTE — Telephone Encounter (Signed)
 Order placed

## 2023-12-21 ENCOUNTER — Telehealth: Payer: Self-pay | Admitting: *Deleted

## 2023-12-21 NOTE — Progress Notes (Signed)
 Complex Care Management Note Care Guide Note  12/21/2023 Name: [name] MRN: [id number] DOB: September 16, 1956   Complex Care Management Outreach Attempts: An unsuccessful telephone outreach was attempted today to offer the patient information about available complex care management services.  Follow Up Plan:  Additional outreach attempts will be made to offer the patient complex care management information and services.   Encounter Outcome:  No Answer  Gwenevere Ghazi  [facility]  [facility], [facility] Guide  Direct Dial: [phone]  Fax [phone]

## 2023-12-21 NOTE — Progress Notes (Addendum)
 Complex Care Management Note  Care Guide Note 12/21/2023 Name: [name] MRN: [id number] DOB: 1956/06/09  [name] is a 68 y.o. year old male who sees Luking, Jonna Coup, MD for primary care. I reached out to Benson Norway by phone today to offer complex care management services.  Mr. Gillie spouse [name] was given information about Complex Care Management services today including:   The Complex Care Management services include support from the care team which includes your Nurse Care Manager, Clinical Social Worker, or Pharmacist.  The Complex Care Management team is here to help remove barriers to the health concerns and goals most important to you. Complex Care Management services are voluntary, and the patient may decline or stop services at any time by request to their care team member.   Complex Care Management Consent Status: Patient spouse [name] agreed to services and verbal consent obtained.   Follow up plan:  Telephone appointment with complex care management team member scheduled for:  12/26/23  Encounter Outcome:  Patient Scheduled  Gwenevere Ghazi  [facility]  [facility], [facility] Guide  Direct Dial: [phone]  Fax [phone]

## 2023-12-24 DIAGNOSIS — E1165 Type 2 diabetes mellitus with hyperglycemia: Secondary | ICD-10-CM | POA: Diagnosis not present

## 2023-12-26 ENCOUNTER — Ambulatory Visit: Payer: Self-pay | Admitting: Licensed Clinical Social Worker

## 2023-12-26 NOTE — Patient Instructions (Signed)
 Visit Information  Thank you for taking time to visit with me today. Please don't hesitate to contact me if I can be of assistance to you.   Following are the goals we discussed today:   Goals Addressed             This Visit's Progress    Increase Access to [facility] Coordination Interventions: Assessed Social Determinants of Health Reviewed all upcoming appointments in Epic system Motivational Interviewing employed Solution-Focused Strategies employed:  Active listening / Reflection utilized  Emotional Support Provided Provided general psycho-education for mental health needs  Will complete referral to Guide Program with [facility] Will complete referral to meals on wheels in [place]          Our next appointment is by telephone on 01/09/2024.  Please call the care guide team at [phone] if you need to cancel or reschedule your appointment.   If you are experiencing a Mental Health or Behavioral Health Crisis or need someone to talk to, please call the Suicide and Crisis Lifeline: 988  Patient verbalizes understanding of instructions and care plan provided today and agrees to view in MyChart. Active MyChart status and patient understanding of how to access instructions and care plan via MyChart confirmed with patient.     Telephone follow up appointment with care management team member scheduled for:01/09/2024

## 2023-12-26 NOTE — Patient Outreach (Signed)
 Care Coordination   Initial Visit Note   12/26/2023 Name: [name] MRN: [id number] DOB: 1955-11-29  [name] is a 68 y.o. year old male who sees Luking, Jonna Coup, MD for primary care. I spoke with  Benson Norway by phone today.  What matters to the patients health and wellness today?  Increasing access to community resources for dementia care.    Goals Addressed             This Visit's Progress    Increase Access to [facility] Coordination Interventions: Assessed Social Determinants of Health Reviewed all upcoming appointments in Epic system Motivational Interviewing employed Solution-Focused Strategies employed:  Active listening / Reflection utilized  Emotional Support Provided Provided general psycho-education for mental health needs  Will complete referral to Guide Program with [facility] Will complete referral to meals on wheels in [place]          SDOH assessments and interventions completed:  Yes  SDOH Interventions Today    Flowsheet Row Most Recent Value  SDOH Interventions   Food Insecurity Interventions Intervention Not Indicated  Housing Interventions Intervention Not Indicated  Transportation Interventions Intervention Not Indicated  Utilities Interventions Intervention Not Indicated        Care Coordination Interventions:  Yes, provided   Interventions Today    Flowsheet Row Most Recent Value  Chronic Disease   Chronic disease during today's visit Other  [Dementia]  General Interventions   General Interventions Discussed/Reviewed [facility], General Interventions Discussed  [CSW and spouse discussed pt's current level of care and challeneges they are experiencing. Spouse reported she is overall managing well and is using family for support more frequently. Spouse was agreeable toreferral for  MOW and Guide to Dementia Care]        Follow up plan: Follow up call scheduled for 01/09/2024     Encounter Outcome:  Patient Visit Completed   Kenton Kingfisher, LCSW [facility], [facility] Licensed Clinical Social Worker Care Coordinator [phone]

## 2023-12-28 ENCOUNTER — Other Ambulatory Visit: Payer: Self-pay

## 2023-12-28 DIAGNOSIS — I739 Peripheral vascular disease, unspecified: Secondary | ICD-10-CM

## 2024-01-08 ENCOUNTER — Ambulatory Visit: Payer: Medicare HMO | Admitting: Surgery

## 2024-01-08 ENCOUNTER — Encounter (HOSPITAL_BASED_OUTPATIENT_CLINIC_OR_DEPARTMENT_OTHER): Payer: Medicare HMO | Attending: Internal Medicine | Admitting: Internal Medicine

## 2024-01-08 ENCOUNTER — Encounter: Payer: Self-pay | Admitting: Surgery

## 2024-01-08 ENCOUNTER — Ambulatory Visit (HOSPITAL_COMMUNITY)
Admission: RE | Admit: 2024-01-08 | Discharge: 2024-01-08 | Disposition: A | Discharge status: Home / Self Care | Payer: Medicare HMO | Source: Ambulatory Visit | Attending: Surgery | Admitting: Surgery

## 2024-01-08 VITALS — BP 145/81 | HR 84 | Temp 97.9°F | Ht 75.0 in | Wt 253.7 lb

## 2024-01-08 DIAGNOSIS — I739 Peripheral vascular disease, unspecified: Secondary | ICD-10-CM | POA: Diagnosis not present

## 2024-01-08 DIAGNOSIS — L97512 Non-pressure chronic ulcer of other part of right foot with fat layer exposed: Secondary | ICD-10-CM | POA: Insufficient documentation

## 2024-01-08 DIAGNOSIS — E11621 Type 2 diabetes mellitus with foot ulcer: Secondary | ICD-10-CM | POA: Insufficient documentation

## 2024-01-08 DIAGNOSIS — I5022 Chronic systolic (congestive) heart failure: Secondary | ICD-10-CM | POA: Insufficient documentation

## 2024-01-08 DIAGNOSIS — I70238 Atherosclerosis of native arteries of right leg with ulceration of other part of lower right leg: Secondary | ICD-10-CM | POA: Diagnosis not present

## 2024-01-08 LAB — VAS US ABI WITH/WO TBI: Left ABI: 1.31

## 2024-01-08 NOTE — H&P (View-Only) (Signed)
 [facility]ist of [place]  Patient name: [name] MRN: [id number] DOB: January 01, 1956 Sex: male   REQUESTING PROVIDER:    Dr. Nolen Mu   REASON FOR CONSULT:    Lower extremity wound  HISTORY OF PRESENT ILLNESS:   [name] is a 68 y.o. male, who is referred for evaluation of right foot wound.  He saw wound care earlier today who dressed the wound.  He states that this has been on the ball of his foot for several years.  He has developed a large callus.  There was concern about bone protruding through this however x-rays were negative.  He has been on and off antibiotics.   The patient suffers from diabetes.  He also has early dementia.  He suffers from congestive heart failure.  He is medically managed for hypertension.  He takes a statin for hypercholesterolemia.  PAST MEDICAL HISTORY    Past Medical History:  Diagnosis Date   CAD (coronary artery disease)    a. s/p BMS to RCA in 2008 b. no ischemia by NST in 03/2021   Cardiomyopathy [facility])    CHF (congestive heart failure) (HCC)    a. EF 30-35% in 2019 with similar results by repeat echo in 03/2021   Depression    DJD (degenerative joint disease)    DM type 2 with diabetic mixed hyperlipidemia (HCC) 12/11/2018   Essential hypertension    Gout    History of inferior wall myocardial infarction 2008   Hypercholesterolemia    Hyperlipidemia    Obesity    Oral candidiasis    Peripheral vascular disease (HCC)    Stroke [facility])    December 2019   Type 2 diabetes mellitus (HCC)      FAMILY HISTORY   Family History  Problem Relation Age of Onset   Diabetes Mother    Colon cancer Mother    Diabetes Father    Lung cancer Father    Cancer Sister    Diabetes Sister    Heart disease Sister        before age 65    SOCIAL HISTORY:   Social History   Socioeconomic History   Marital status: Married    Spouse name: Not on file   Number of children: Not on file    Years of education: Not on file   Highest education level: Not on file  Occupational History   Not on file  Tobacco Use   Smoking status: Former    Current packs/day: 0.00    Average packs/day: 1 pack/day for 10.0 years (10.0 ttl pk-yrs)    Types: Cigarettes    Start date: 12/22/1960    Quit date: 12/23/1970    Years since quitting: 53.0   Smokeless tobacco: Never   Tobacco comments:    quit in 1979  Vaping Use   Vaping status: Never Used  Substance and Sexual Activity   Alcohol use: No    Alcohol/week: 0.0 standard drinks of alcohol   Drug use: Yes    Types: Marijuana   Sexual activity: Yes    Birth control/protection: None  Other Topics Concern   Not on file  Social History Narrative    He is married and has 3 kids.  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  He denies any tobacco or alcohol use.    Social Drivers of Health   Financial Resource Strain: Low Risk  (06/09/2023)   Overall Financial  Resource Strain (CARDIA)    Difficulty of Paying Living Expenses: Not hard at all  Food Insecurity: No Food Insecurity (12/26/2023)   Hunger Vital Sign    Worried About Running Out of Food in the Last Year: Never true    Ran Out of Food in the Last Year: Never true  Transportation Needs: No Transportation Needs (12/26/2023)   PRAPARE - Administrator, Civil Service (Medical): No    Lack of Transportation (Non-Medical): No  Physical Activity: Insufficiently Active (06/09/2023)   Exercise Vital Sign    Days of Exercise per Week: 7 days    Minutes of Exercise per Session: 20 min  Stress: No Stress Concern Present (06/09/2023)   [employer] of Occupational Health - Occupational Stress Questionnaire    Feeling of Stress : Not at all  Social Connections: Moderately Isolated (06/09/2023)   Social Connection and Isolation Panel [NHANES]    Frequency of Communication with Friends and Family: More than three times a week    Frequency of Social Gatherings with  Friends and Family: More than three times a week    Attends Religious Services: Never    Database administrator or Organizations: No    Attends Banker Meetings: Never    Marital Status: Married  Catering manager Violence: Not At Risk (12/26/2023)   Humiliation, Afraid, Rape, and Kick questionnaire    Fear of Current or Ex-Partner: No    Emotionally Abused: No    Physically Abused: No    Sexually Abused: No    ALLERGIES:    Allergies  Allergen Reactions   Metformin And Related     GI symptoms   Statins Other (See Comments)    Legs ache, he states Crestor cause shoulder pain, states Crestor caused rash on hands   Vicodin [Hydrocodone-Acetaminophen] Nausea Only    CURRENT MEDICATIONS:    Current Outpatient Medications  Medication Sig Dispense Refill   atorvastatin (LIPITOR) 10 MG tablet TAKE ONE TABLET BY MOUTH ON MONDAY AND FRIDAY 24 tablet 2   betamethasone valerate ointment (VALISONE) 0.1 % Apply 1 Application topically 2 (two) times daily as needed (Psoriasis). 45 g 5   clopidogrel (PLAVIX) 75 MG tablet Take 1 tablet (75 mg total) by mouth daily. 30 tablet 11   Continuous Glucose Receiver (FREESTYLE LIBRE 3 READER) DEVI with compatible Freestyle Libre 3 sensor to monitor glucose continuously. Patient can also use cell phone if compatible; DX: E11.65 1 each 1   Continuous Glucose Sensor (FREESTYLE LIBRE 3 PLUS SENSOR) MISC Change sensor every 15 days. DX: E11.65 2 each 5   ergocalciferol (VITAMIN D2) 1.25 MG (50000 UT) capsule Take 1 capsule by mouth once a week.     furosemide (LASIX) 20 MG tablet TAKE 1 TABLET BY MOUTH EVERY DAY IN THE MORNING AS NEEDED 90 tablet 2   Insulin Pen Needle (B-D ULTRAFINE III SHORT PEN) 31G X 8 MM MISC USE PEN NEEDLES DAILY WITH LANTUS 100 each 5   memantine (NAMENDA) 5 MG tablet Take 1 tablet (5 mg total) by mouth 2 (two) times daily. 180 tablet 1   metFORMIN (GLUCOPHAGE-XR) 500 MG 24 hr tablet Take 1 tablet (500 mg total) by mouth  daily with breakfast. 90 tablet 1   metoprolol succinate (TOPROL XL) 25 MG 24 hr tablet Take 1 tablet (25 mg total) by mouth daily. 90 tablet 3   Multiple Vitamins-Minerals (MULTIVITAMIN WITH MINERALS) tablet Take 1 tablet by mouth daily.     mupirocin  ointment (BACTROBAN) 2 % Apply topically daily. Apply thin amount bid 30 g 2   oxyCODONE-acetaminophen (PERCOCET) 10-325 MG tablet One qid prn pain 120 tablet 0   oxyCODONE-acetaminophen (PERCOCET) 10-325 MG tablet One qid prn pain 120 tablet 0   oxyCODONE-acetaminophen (PERCOCET) 10-325 MG tablet 1 taken 4 times daily as needed for pain 120 tablet 0   sertraline (ZOLOFT) 100 MG tablet Take 1 tablet (100 mg total) by mouth daily. 90 tablet 1   silver sulfADIAZINE (SILVADENE) 1 % cream Apply 1 Application topically daily. 50 g 6   TRESIBA FLEXTOUCH 100 UNIT/ML FlexTouch Pen Inject 56 Units into the skin daily.     valsartan (DIOVAN) 40 MG tablet Take 1 tablet (40 mg total) by mouth daily. 90 tablet 1   No current facility-administered medications for this visit.    REVIEW OF SYSTEMS:   [X]  denotes positive finding, [ ]  denotes negative finding Cardiac  Comments:  Chest pain or chest pressure:    Shortness of breath upon exertion:    Short of breath when lying flat:    Irregular heart rhythm:        Vascular    Pain in calf, thigh, or hip brought on by ambulation:    Pain in feet at night that wakes you up from your sleep:     Blood clot in your veins:    Leg swelling:         Pulmonary    Oxygen at home:    Productive cough:     Wheezing:         Neurologic    Sudden weakness in arms or legs:     Sudden numbness in arms or legs:     Sudden onset of difficulty speaking or slurred speech:    Temporary loss of vision in one eye:     Problems with dizziness:         Gastrointestinal    Blood in stool:      Vomited blood:         Genitourinary    Burning when urinating:     Blood in urine:        Psychiatric    Major  depression:         Hematologic    Bleeding problems:    Problems with blood clotting too easily:        Skin    Rashes or ulcers:        Constitutional    Fever or chills:     PHYSICAL EXAM:   Vitals:   01/08/24 1212  BP: (!) 145/81  Pulse: 84  Temp: 97.9 F (36.6 C)  SpO2: 98%  Weight: 253 lb 11.2 oz (115.1 kg)  Height: 6\' 3"  (1.905 m)    GENERAL: The patient is a well-nourished male, in no acute distress. The vital signs are documented above. CARDIAC: There is a regular rate and rhythm.  VASCULAR: 2+ right leg pitting edema.  Pedal pulses are not palpable. PULMONARY: Nonlabored respirations ABDOMEN: Soft and non-tender with normal pitched bowel sounds.  MUSCULOSKELETAL: There are no major deformities or cyanosis. NEUROLOGIC: No focal weakness or paresthesias are detected. SKIN: Dressing was not taken down because it was just replaced at the wound center this morning PSYCHIATRIC: The patient has a normal affect.  STUDIES:   I have reviewed the following: +-------+-----------+-----------+------------+------------+  ABI/TBIToday's ABIToday's TBIPrevious ABIPrevious TBI  +-------+-----------+-----------+------------+------------+  Right [place]         amputated  1.21  69            +-------+-----------+-----------+------------+------------+  Left  1.31       1.06       1.3         69            +-------+-----------+-----------+------------+------------+        ASSESSMENT and PLAN   Right lower extremity diabetic foot ulcer in the setting of PAD: The patient has a chronic wound that has not never completely resolved.  He underwent noninvasive imaging today that shows noncompressible vessels so ABIs could not be calculated.  He did have dampened waveforms.  I discussed with the patient and wife that I feel that he needs to undergo angiography to better define his anatomy and make sure that he has adequate perfusion of the foot for healing.  This  will be scheduled for Tuesday, April 1   Charlena Cross, MD, [facility]ists of [facility] [phone] Pager [phone]

## 2024-01-08 NOTE — Progress Notes (Signed)
 [facility] of [place]  Patient name: [name] MRN: [id number] DOB: January 01, 1956 Sex: male   REQUESTING PROVIDER:    Dr. Nolen Mu   REASON FOR CONSULT:    Lower extremity wound  HISTORY OF PRESENT ILLNESS:   [name] is a 68 y.o. male, who is referred for evaluation of right foot wound.  He saw wound care earlier today who dressed the wound.  He states that this has been on the ball of his foot for several years.  He has developed a large callus.  There was concern about bone protruding through this however x-rays were negative.  He has been on and off antibiotics.   The patient suffers from diabetes.  He also has early dementia.  He suffers from congestive heart failure.  He is medically managed for hypertension.  He takes a statin for hypercholesterolemia.  PAST MEDICAL HISTORY    Past Medical History:  Diagnosis Date   CAD (coronary artery disease)    a. s/p BMS to RCA in 2008 b. no ischemia by NST in 03/2021   Cardiomyopathy [facility])    CHF (congestive heart failure) (HCC)    a. EF 30-35% in 2019 with similar results by repeat echo in 03/2021   Depression    DJD (degenerative joint disease)    DM type 2 with diabetic mixed hyperlipidemia (HCC) 12/11/2018   Essential hypertension    Gout    History of inferior wall myocardial infarction 2008   Hypercholesterolemia    Hyperlipidemia    Obesity    Oral candidiasis    Peripheral vascular disease (HCC)    Stroke [facility])    December 2019   Type 2 diabetes mellitus (HCC)      FAMILY HISTORY   Family History  Problem Relation Age of Onset   Diabetes Mother    Colon cancer Mother    Diabetes Father    Lung cancer Father    Cancer Sister    Diabetes Sister    Heart disease Sister        before age 65    SOCIAL HISTORY:   Social History   Socioeconomic History   Marital status: Married    Spouse name: Not on file   Number of children: Not on file    Years of education: Not on file   Highest education level: Not on file  Occupational History   Not on file  Tobacco Use   Smoking status: Former    Current packs/day: 0.00    Average packs/day: 1 pack/day for 10.0 years (10.0 ttl pk-yrs)    Types: Cigarettes    Start date: 12/22/1960    Quit date: 12/23/1970    Years since quitting: 53.0   Smokeless tobacco: Never   Tobacco comments:    quit in 1979  Vaping Use   Vaping status: Never Used  Substance and Sexual Activity   Alcohol use: No    Alcohol/week: 0.0 standard drinks of alcohol   Drug use: Yes    Types: Marijuana   Sexual activity: Yes    Birth control/protection: None  Other Topics Concern   Not on file  Social History Narrative    He is married and has 3 kids.  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  He denies any tobacco or alcohol use.    Social Drivers of Health   Financial Resource Strain: Low Risk  (06/09/2023)   Overall Financial  Resource Strain (CARDIA)    Difficulty of Paying Living Expenses: Not hard at all  Food Insecurity: No Food Insecurity (12/26/2023)   Hunger Vital Sign    Worried About Running Out of Food in the Last Year: Never true    Ran Out of Food in the Last Year: Never true  Transportation Needs: No Transportation Needs (12/26/2023)   PRAPARE - Administrator, Civil Service (Medical): No    Lack of Transportation (Non-Medical): No  Physical Activity: Insufficiently Active (06/09/2023)   Exercise Vital Sign    Days of Exercise per Week: 7 days    Minutes of Exercise per Session: 20 min  Stress: No Stress Concern Present (06/09/2023)   [employer] of Occupational Health - Occupational Stress Questionnaire    Feeling of Stress : Not at all  Social Connections: Moderately Isolated (06/09/2023)   Social Connection and Isolation Panel [NHANES]    Frequency of Communication with Friends and Family: More than three times a week    Frequency of Social Gatherings with  Friends and Family: More than three times a week    Attends Religious Services: Never    Database administrator or Organizations: No    Attends Banker Meetings: Never    Marital Status: Married  Catering manager Violence: Not At Risk (12/26/2023)   Humiliation, Afraid, Rape, and Kick questionnaire    Fear of Current or Ex-Partner: No    Emotionally Abused: No    Physically Abused: No    Sexually Abused: No    ALLERGIES:    Allergies  Allergen Reactions   Metformin And Related     GI symptoms   Statins Other (See Comments)    Legs ache, he states Crestor cause shoulder pain, states Crestor caused rash on hands   Vicodin [Hydrocodone-Acetaminophen] Nausea Only    CURRENT MEDICATIONS:    Current Outpatient Medications  Medication Sig Dispense Refill   atorvastatin (LIPITOR) 10 MG tablet TAKE ONE TABLET BY MOUTH ON MONDAY AND FRIDAY 24 tablet 2   betamethasone valerate ointment (VALISONE) 0.1 % Apply 1 Application topically 2 (two) times daily as needed (Psoriasis). 45 g 5   clopidogrel (PLAVIX) 75 MG tablet Take 1 tablet (75 mg total) by mouth daily. 30 tablet 11   Continuous Glucose Receiver (FREESTYLE LIBRE 3 READER) DEVI with compatible Freestyle Libre 3 sensor to monitor glucose continuously. Patient can also use cell phone if compatible; DX: E11.65 1 each 1   Continuous Glucose Sensor (FREESTYLE LIBRE 3 PLUS SENSOR) MISC Change sensor every 15 days. DX: E11.65 2 each 5   ergocalciferol (VITAMIN D2) 1.25 MG (50000 UT) capsule Take 1 capsule by mouth once a week.     furosemide (LASIX) 20 MG tablet TAKE 1 TABLET BY MOUTH EVERY DAY IN THE MORNING AS NEEDED 90 tablet 2   Insulin Pen Needle (B-D ULTRAFINE III SHORT PEN) 31G X 8 MM MISC USE PEN NEEDLES DAILY WITH LANTUS 100 each 5   memantine (NAMENDA) 5 MG tablet Take 1 tablet (5 mg total) by mouth 2 (two) times daily. 180 tablet 1   metFORMIN (GLUCOPHAGE-XR) 500 MG 24 hr tablet Take 1 tablet (500 mg total) by mouth  daily with breakfast. 90 tablet 1   metoprolol succinate (TOPROL XL) 25 MG 24 hr tablet Take 1 tablet (25 mg total) by mouth daily. 90 tablet 3   Multiple Vitamins-Minerals (MULTIVITAMIN WITH MINERALS) tablet Take 1 tablet by mouth daily.     mupirocin  ointment (BACTROBAN) 2 % Apply topically daily. Apply thin amount bid 30 g 2   oxyCODONE-acetaminophen (PERCOCET) 10-325 MG tablet One qid prn pain 120 tablet 0   oxyCODONE-acetaminophen (PERCOCET) 10-325 MG tablet One qid prn pain 120 tablet 0   oxyCODONE-acetaminophen (PERCOCET) 10-325 MG tablet 1 taken 4 times daily as needed for pain 120 tablet 0   sertraline (ZOLOFT) 100 MG tablet Take 1 tablet (100 mg total) by mouth daily. 90 tablet 1   silver sulfADIAZINE (SILVADENE) 1 % cream Apply 1 Application topically daily. 50 g 6   TRESIBA FLEXTOUCH 100 UNIT/ML FlexTouch Pen Inject 56 Units into the skin daily.     valsartan (DIOVAN) 40 MG tablet Take 1 tablet (40 mg total) by mouth daily. 90 tablet 1   No current facility-administered medications for this visit.    REVIEW OF SYSTEMS:   [X]  denotes positive finding, [ ]  denotes negative finding Cardiac  Comments:  Chest pain or chest pressure:    Shortness of breath upon exertion:    Short of breath when lying flat:    Irregular heart rhythm:        Vascular    Pain in calf, thigh, or hip brought on by ambulation:    Pain in feet at night that wakes you up from your sleep:     Blood clot in your veins:    Leg swelling:         Pulmonary    Oxygen at home:    Productive cough:     Wheezing:         Neurologic    Sudden weakness in arms or legs:     Sudden numbness in arms or legs:     Sudden onset of difficulty speaking or slurred speech:    Temporary loss of vision in one eye:     Problems with dizziness:         Gastrointestinal    Blood in stool:      Vomited blood:         Genitourinary    Burning when urinating:     Blood in urine:        Psychiatric    Major  depression:         Hematologic    Bleeding problems:    Problems with blood clotting too easily:        Skin    Rashes or ulcers:        Constitutional    Fever or chills:     PHYSICAL EXAM:   Vitals:   01/08/24 1212  BP: (!) 145/81  Pulse: 84  Temp: 97.9 F (36.6 C)  SpO2: 98%  Weight: 253 lb 11.2 oz (115.1 kg)  Height: 6\' 3"  (1.905 m)    GENERAL: The patient is a well-nourished male, in no acute distress. The vital signs are documented above. CARDIAC: There is a regular rate and rhythm.  VASCULAR: 2+ right leg pitting edema.  Pedal pulses are not palpable. PULMONARY: Nonlabored respirations ABDOMEN: Soft and non-tender with normal pitched bowel sounds.  MUSCULOSKELETAL: There are no major deformities or cyanosis. NEUROLOGIC: No focal weakness or paresthesias are detected. SKIN: Dressing was not taken down because it was just replaced at the wound center this morning PSYCHIATRIC: The patient has a normal affect.  STUDIES:   I have reviewed the following: +-------+-----------+-----------+------------+------------+  ABI/TBIToday's ABIToday's TBIPrevious ABIPrevious TBI  +-------+-----------+-----------+------------+------------+  Right [place]         amputated  1.21  69            +-------+-----------+-----------+------------+------------+  Left  1.31       1.06       1.3         69            +-------+-----------+-----------+------------+------------+        ASSESSMENT and PLAN   Right lower extremity diabetic foot ulcer in the setting of PAD: The patient has a chronic wound that has not never completely resolved.  He underwent noninvasive imaging today that shows noncompressible vessels so ABIs could not be calculated.  He did have dampened waveforms.  I discussed with the patient and wife that I feel that he needs to undergo angiography to better define his anatomy and make sure that he has adequate perfusion of the foot for healing.  This  will be scheduled for Tuesday, April 1   Charlena Cross, MD, [facility] of [facility] [phone] Pager [phone]

## 2024-01-09 ENCOUNTER — Telehealth: Payer: Self-pay

## 2024-01-09 NOTE — Telephone Encounter (Signed)
 Attempted to call for surgery scheduling. LVM

## 2024-01-10 ENCOUNTER — Encounter: Payer: Self-pay | Admitting: Family Medicine

## 2024-01-10 ENCOUNTER — Other Ambulatory Visit: Payer: Self-pay

## 2024-01-10 ENCOUNTER — Ambulatory Visit: Payer: Medicare HMO | Admitting: Family Medicine

## 2024-01-10 VITALS — BP 137/84 | HR 84 | Temp 97.7°F | Ht 75.0 in | Wt 247.0 lb

## 2024-01-10 DIAGNOSIS — Z79891 Long term (current) use of opiate analgesic: Secondary | ICD-10-CM

## 2024-01-10 DIAGNOSIS — G8929 Other chronic pain: Secondary | ICD-10-CM

## 2024-01-10 DIAGNOSIS — F0150 Vascular dementia without behavioral disturbance: Secondary | ICD-10-CM

## 2024-01-10 DIAGNOSIS — R413 Other amnesia: Secondary | ICD-10-CM

## 2024-01-10 DIAGNOSIS — I70238 Atherosclerosis of native arteries of right leg with ulceration of other part of lower right leg: Secondary | ICD-10-CM

## 2024-01-10 DIAGNOSIS — Z5181 Encounter for therapeutic drug level monitoring: Secondary | ICD-10-CM | POA: Diagnosis not present

## 2024-01-10 NOTE — Progress Notes (Addendum)
   Subjective:    Patient ID: [name], male    DOB: 1956-10-04, 68 y.o.   MRN: [id number] This verifies that the history review of any tests, physical exam, assessment and plan were conducted by Dr. Lilyan Punt and documented accordingly by him today Lilyan Punt MD primary care [facility]  HPI T.hpisec Discussed the use of AI scribe software for clinical note transcription with the patient, who gave verbal consent to proceed.  History of Present Illness   [name] "[name]" is a 68 year old male who presents with right leg issues and pain management.  He is experiencing issues with his right leg, which may require surgical intervention depending on the location of arterial narrowings.  He manages pain with medication, taking up to four doses per day as needed, though he tries to reduce to three and a half doses when possible.  He is on sertraline for mood management, which appears to be providing some benefit.  He does not drive as his license is held by someone else. He is able to use a lawn mower or golf cart for mobility and uses a walking staff occasionally to aid with balance and prevent falls.        Review of Systems     Objective:   Physical Exam  General-in no acute distress Eyes-no discharge Lungs-respiratory rate normal, CTA CV-no murmurs,RRR Extremities skin warm dry no edema Neuro grossly normal Behavior normal, alert Patient has difficult time walking because of his right foot Patient does have significant dementia issues his wife helps him out a lot      Assessment & Plan:  1. Other chronic pain (Primary) Patient on pain medicine we did discuss how this will gradually need to taper down for now he can stick with 4/day because he is facing foot surgery  2. Vascular dementia without behavioral disturbance, psychotic disturbance, mood disturbance, or anxiety, unspecified dementia severity (HCC) His wife helps him out He is not to  drive He understands   3. Short-term memory loss His wife helps him out  4. Encounter for monitoring opioid maintenance therapy Continue pain medicine at 4/day but on next visit we will be tapering down Patient is due for urine drug screen   Patient's depression doing better with the sertraline moods are doing better continue current measures

## 2024-01-15 ENCOUNTER — Encounter (HOSPITAL_BASED_OUTPATIENT_CLINIC_OR_DEPARTMENT_OTHER): Admitting: Internal Medicine

## 2024-01-15 DIAGNOSIS — E11621 Type 2 diabetes mellitus with foot ulcer: Secondary | ICD-10-CM | POA: Diagnosis not present

## 2024-01-15 DIAGNOSIS — L97512 Non-pressure chronic ulcer of other part of right foot with fat layer exposed: Secondary | ICD-10-CM | POA: Diagnosis not present

## 2024-01-15 DIAGNOSIS — I5022 Chronic systolic (congestive) heart failure: Secondary | ICD-10-CM | POA: Diagnosis not present

## 2024-01-16 ENCOUNTER — Other Ambulatory Visit: Payer: Self-pay

## 2024-01-16 ENCOUNTER — Ambulatory Visit (HOSPITAL_COMMUNITY)
Admission: RE | Admit: 2024-01-16 | Discharge: 2024-01-16 | Disposition: A | Discharge status: Home / Self Care | Attending: Surgery | Admitting: Surgery

## 2024-01-16 ENCOUNTER — Encounter (HOSPITAL_COMMUNITY)
Admission: RE | Disposition: A | Discharge status: Home / Self Care | Payer: Self-pay | Source: Home / Self Care | Attending: Surgery

## 2024-01-16 DIAGNOSIS — E1151 Type 2 diabetes mellitus with diabetic peripheral angiopathy without gangrene: Secondary | ICD-10-CM | POA: Diagnosis not present

## 2024-01-16 DIAGNOSIS — I70235 Atherosclerosis of native arteries of right leg with ulceration of other part of foot: Secondary | ICD-10-CM | POA: Diagnosis not present

## 2024-01-16 DIAGNOSIS — F32A Depression, unspecified: Secondary | ICD-10-CM | POA: Insufficient documentation

## 2024-01-16 DIAGNOSIS — I509 Heart failure, unspecified: Secondary | ICD-10-CM | POA: Insufficient documentation

## 2024-01-16 DIAGNOSIS — L97519 Non-pressure chronic ulcer of other part of right foot with unspecified severity: Secondary | ICD-10-CM | POA: Insufficient documentation

## 2024-01-16 DIAGNOSIS — I110 Hypertensive heart disease with heart failure: Secondary | ICD-10-CM | POA: Diagnosis not present

## 2024-01-16 DIAGNOSIS — Z87891 Personal history of nicotine dependence: Secondary | ICD-10-CM | POA: Insufficient documentation

## 2024-01-16 DIAGNOSIS — E7800 Pure hypercholesterolemia, unspecified: Secondary | ICD-10-CM | POA: Insufficient documentation

## 2024-01-16 DIAGNOSIS — Z79899 Other long term (current) drug therapy: Secondary | ICD-10-CM | POA: Insufficient documentation

## 2024-01-16 DIAGNOSIS — Z794 Long term (current) use of insulin: Secondary | ICD-10-CM | POA: Diagnosis not present

## 2024-01-16 DIAGNOSIS — Z7984 Long term (current) use of oral hypoglycemic drugs: Secondary | ICD-10-CM | POA: Insufficient documentation

## 2024-01-16 DIAGNOSIS — F0393 Unspecified dementia, unspecified severity, with mood disturbance: Secondary | ICD-10-CM | POA: Diagnosis not present

## 2024-01-16 DIAGNOSIS — I70238 Atherosclerosis of native arteries of right leg with ulceration of other part of lower right leg: Secondary | ICD-10-CM

## 2024-01-16 DIAGNOSIS — E11621 Type 2 diabetes mellitus with foot ulcer: Secondary | ICD-10-CM | POA: Insufficient documentation

## 2024-01-16 HISTORY — PX: ABDOMINAL AORTOGRAM: CATH118222

## 2024-01-16 HISTORY — PX: LOWER EXTREMITY ANGIOGRAPHY: CATH118251

## 2024-01-16 HISTORY — PX: LOWER EXTREMITY INTERVENTION: CATH118252

## 2024-01-16 LAB — POCT I-STAT, CHEM 8
BUN: 26 mg/dL — ABNORMAL HIGH (ref 8–23)
Calcium, Ion: 1.16 mmol/L (ref 1.15–1.40)
Chloride: 104 mmol/L (ref 98–111)
Creatinine, Ser: 1.1 mg/dL (ref 0.61–1.24)
Glucose, Bld: 127 mg/dL — ABNORMAL HIGH (ref 70–99)
HCT: 36 % — ABNORMAL LOW (ref 39.0–52.0)
Hemoglobin: 12.2 g/dL — ABNORMAL LOW (ref 13.0–17.0)
Potassium: 4.4 mmol/L (ref 3.5–5.1)
Sodium: 138 mmol/L (ref 135–145)
TCO2: 25 mmol/L (ref 22–32)

## 2024-01-16 LAB — GLUCOSE, CAPILLARY: Glucose-Capillary: 116 mg/dL — ABNORMAL HIGH (ref 70–99)

## 2024-01-16 SURGERY — ABDOMINAL AORTOGRAM
Anesthesia: LOCAL | Laterality: Right

## 2024-01-16 MED ORDER — SODIUM CHLORIDE 0.9% FLUSH: 3.0000 mL | Freq: Two times a day (BID) | INTRAVENOUS | Status: DC

## 2024-01-16 MED ORDER — IODIXANOL 320 MG/ML IV SOLN
INTRAVENOUS | Status: DC | PRN
  Administered 2024-01-16: 70 mL

## 2024-01-16 MED ORDER — HYDRALAZINE HCL 20 MG/ML IJ SOLN: 5.0000 mg | INTRAMUSCULAR | Status: DC | PRN

## 2024-01-16 MED ORDER — HEPARIN SODIUM (PORCINE) 1000 UNIT/ML IJ SOLN
INTRAMUSCULAR | Status: DC | PRN
  Administered 2024-01-16: 10000 [IU] via INTRAVENOUS

## 2024-01-16 MED ORDER — LIDOCAINE HCL (PF) 1 % IJ SOLN
INTRAMUSCULAR | Status: DC | PRN
  Administered 2024-01-16: 10 mL

## 2024-01-16 MED ORDER — FENTANYL CITRATE (PF) 100 MCG/2ML IJ SOLN
INTRAMUSCULAR | Status: AC
  Filled 2024-01-16: qty 2

## 2024-01-16 MED ORDER — HEPARIN (PORCINE) IN NACL 1000-0.9 UT/500ML-% IV SOLN
INTRAVENOUS | Status: DC | PRN
  Administered 2024-01-16 (×2): 500 mL

## 2024-01-16 MED ORDER — FENTANYL CITRATE (PF) 100 MCG/2ML IJ SOLN
INTRAMUSCULAR | Status: DC | PRN
  Administered 2024-01-16: 50 ug via INTRAVENOUS

## 2024-01-16 MED ORDER — MIDAZOLAM HCL 2 MG/2ML IJ SOLN
INTRAMUSCULAR | Status: DC | PRN
  Administered 2024-01-16: 2 mg via INTRAVENOUS

## 2024-01-16 MED ORDER — SODIUM CHLORIDE 0.9 % WEIGHT BASED INFUSION: 1.0000 mL/kg/h | INTRAVENOUS | Status: DC

## 2024-01-16 MED ORDER — MIDAZOLAM HCL 2 MG/2ML IJ SOLN
INTRAMUSCULAR | Status: AC
  Filled 2024-01-16: qty 2

## 2024-01-16 MED ORDER — OXYCODONE HCL 5 MG PO TABS: 5.0000 mg | ORAL_TABLET | ORAL | Status: DC | PRN

## 2024-01-16 MED ORDER — HEPARIN SODIUM (PORCINE) 1000 UNIT/ML IJ SOLN
INTRAMUSCULAR | Status: AC
  Filled 2024-01-16: qty 10

## 2024-01-16 MED ORDER — LABETALOL HCL 5 MG/ML IV SOLN: 10.0000 mg | INTRAVENOUS | Status: DC | PRN

## 2024-01-16 MED ORDER — LIDOCAINE HCL (PF) 1 % IJ SOLN
INTRAMUSCULAR | Status: AC
Start: 2024-01-16 — End: ?
  Filled 2024-01-16: qty 30

## 2024-01-16 MED ORDER — ONDANSETRON HCL 4 MG/2ML IJ SOLN: 4.0000 mg | Freq: Four times a day (QID) | INTRAMUSCULAR | Status: DC | PRN

## 2024-01-16 MED ORDER — SODIUM CHLORIDE 0.9 % IV SOLN: INTRAVENOUS | Status: DC

## 2024-01-16 MED ORDER — ASPIRIN 81 MG PO TBEC
81.0000 mg | DELAYED_RELEASE_TABLET | Freq: Every day | ORAL | Status: DC
  Administered 2024-01-16: 81 mg via ORAL
  Filled 2024-01-16: qty 1

## 2024-01-16 MED ORDER — ASPIRIN 81 MG PO TBEC: 81.0000 mg | DELAYED_RELEASE_TABLET | Freq: Every day | ORAL | 12 refills | Status: DC

## 2024-01-16 MED ORDER — MORPHINE SULFATE (PF) 2 MG/ML IV SOLN: 2.0000 mg | INTRAVENOUS | Status: DC | PRN

## 2024-01-16 MED ORDER — SODIUM CHLORIDE 0.9% FLUSH: 3.0000 mL | INTRAVENOUS | Status: DC | PRN

## 2024-01-16 MED ORDER — SODIUM CHLORIDE 0.9 % IV SOLN: 250.0000 mL | INTRAVENOUS | Status: DC | PRN

## 2024-01-16 SURGICAL SUPPLY — 16 items
BALLN STERLING OTW 2.5X100X150 (BALLOONS) ×2 IMPLANT
BALLOON STRLNG OTW 2.5X100X150 (BALLOONS) IMPLANT
CATH OMNI FLUSH 5F 65CM (CATHETERS) IMPLANT
DEVICE VASC CLSR CELT ART 5 (Vascular Products) IMPLANT
KIT ENCORE 26 ADVANTAGE (KITS) IMPLANT
KIT MICROPUNCTURE NIT STIFF (SHEATH) IMPLANT
KIT PV (KITS) ×3 IMPLANT
KIT SINGLE USE MANIFOLD (KITS) IMPLANT
KIT SYRINGE INJ CVI SPIKEX1 (MISCELLANEOUS) IMPLANT
SET ATX-X65L (MISCELLANEOUS) IMPLANT
SHEATH CATAPULT 5FR 60 (SHEATH) IMPLANT
SHEATH PINNACLE 5F 10CM (SHEATH) IMPLANT
SHEATH PROBE COVER 6X72 (BAG) IMPLANT
TRAY PV CATH (CUSTOM PROCEDURE TRAY) ×3 IMPLANT
WIRE BENTSON .035X145CM (WIRE) IMPLANT
WIRE G V18X300CM (WIRE) IMPLANT

## 2024-01-16 NOTE — Discharge Instructions (Signed)
 Femoral Site Care This sheet gives you information about how to care for yourself after your procedure. Your health care provider may also give you more specific instructions. If you have problems or questions, contact your health care provider. What can I expect after the procedure?  After the procedure, it is common to have: Bruising that usually fades within 1-2 weeks. Tenderness at the site. Follow these instructions at home: Wound care Follow instructions from your health care provider about how to take care of your insertion site. Make sure you: Wash your hands with soap and water before you change your bandage (dressing). If soap and water are not available, use hand sanitizer. Remove your dressing as told by your health care provider. In 24 hours Do not take baths, swim, or use a hot tub until your health care provider approves. You may shower 24-48 hours after the procedure or as told by your health care provider. Gently wash the site with plain soap and water. Pat the area dry with a clean towel. Do not rub the site. This may cause bleeding. Do not apply powder or lotion to the site. Keep the site clean and dry. Check your femoral site every day for signs of infection. Check for: Redness, swelling, or pain. Fluid or blood. Warmth. Pus or a bad smell. Activity For the first 2-3 days after your procedure, or as long as directed: Avoid climbing stairs as much as possible. Do not squat. Do not lift anything that is heavier than 10 lb (4.5 kg), or the limit that you are told, until your health care provider says that it is safe. For 5 days Rest as directed. Avoid sitting for a long time without moving. Get up to take short walks every 1-2 hours. Do not drive for 24 hours if you were given a medicine to help you relax (sedative). General instructions Take over-the-counter and prescription medicines only as told by your health care provider. Keep all follow-up visits as told by  your health care provider. This is important. Contact a health care provider if you have: A fever or chills. You have redness, swelling, or pain around your insertion site. Get help right away if: The catheter insertion area swells very fast. You pass out. You suddenly start to sweat or your skin gets clammy. The catheter insertion area is bleeding, and the bleeding does not stop when you hold steady pressure on the area. The area near or just beyond the catheter insertion site becomes pale, cool, tingly, or numb. These symptoms may represent a serious problem that is an emergency. Do not wait to see if the symptoms will go away. Get medical help right away. Call your local emergency services (911 in the [place] Do not drive yourself to the hospital. Summary After the procedure, it is common to have bruising that usually fades within 1-2 weeks. Check your femoral site every day for signs of infection. Do not lift anything that is heavier than 10 lb (4.5 kg), or the limit that you are told, until your health care provider says that it is safe. This information is not intended to replace advice given to you by your health care provider. Make sure you discuss any questions you have with your health care provider. Document Revised: 10/16/2017 Document Reviewed: 10/16/2017 Elsevier Patient Education  2020 [employer].

## 2024-01-16 NOTE — Progress Notes (Signed)
 Patient walked to the bathroom without difficulties

## 2024-01-16 NOTE — Op Note (Signed)
 Patient name: [name] MRN: [id number] DOB: 04/26/1956 Sex: male  01/16/2024 Pre-operative Diagnosis: Right foot ulcer Post-operative diagnosis:  Same Surgeon:  [name] Procedure Performed:  1.  Ultrasound-guided access, left femoral artery  2.  Abdominal aortogram  3.  Right leg angiogram  4.  Selective injection with catheter in the right SFA, right posterior tibial, and right anterior tibial artery  5.  Angioplasty, right posterior tibial artery  6.  Conscious sedation, 64 minutes  7.  Closure device, Celt   Indications: This is a 68 year old gentleman with chronic right foot diabetic wound with poor circulation who comes in today for angiogram and possible intervention  Procedure:  The patient was identified in the holding area and taken to room 8.  The patient was then placed supine on the table and prepped and draped in the usual sterile fashion.  A time out was called.  Conscious sedation was administered with the use of IV fentanyl and Versed under continuous physician and nurse monitoring.  Heart rate, blood pressure, and oxygen saturation were continuously monitored.  Total sedation time was 64 minutes.  Ultrasound was used to evaluate the left common femoral artery.  It was patent .  A digital ultrasound image was acquired.  A micropuncture needle was used to access the left common femoral artery under ultrasound guidance.  An 018 wire was advanced without resistance and a micropuncture sheath was placed.  The 018 wire was removed and a benson wire was placed.  The micropuncture sheath was exchanged for a 5 french sheath.  An omniflush catheter was advanced over the wire to the level of L-1.  An abdominal angiogram was obtained.  Next, using the omniflush catheter and a benson wire, the aortic bifurcation was crossed and the catheter was placed into theright external iliac artery and right runoff was obtained.  Additional imaging was performed with the catheter in the right  superficial femoral artery as well as the right posterior tibial and anterior tibial artery  Findings:   Aortogram: No significant renal artery stenosis.  The infrarenal abdominal aorta is widely patent without significant stenosis.  Bilateral common and external iliac arteries are widely patent.  Right Lower Extremity: The right common femoral and profundofemoral artery are widely patent.  The right superficial femoral and popliteal artery are widely patent.  The anterior tibial artery occludes just beyond its origin with reconstitution out onto the foot.  The posterior tibial and peroneal artery are patent down to the ankle.  The peroneal artery collateralizes into the dorsalis pedis and posterior tibial.  There is approximately a 90% stenosis in the posterior tibial artery distally.  Left Lower Extremity: Not evaluated  Intervention: After the above images were acquired the decision was made to proceed with intervention.  A 5 French 60 cm sheath was advanced into the right superficial femoral artery and the patient was fully heparinized.  Next using a V-18 wire and a 2.5 x 100 Sterling balloon, I was able to cross the stenosis in the distal posterior tibial artery and performed balloon angioplasty with minimal residual stenosis, less than 10%.  There is significantly improved perfusion to the foot.  Next, I tried to access the anterior tibial artery.  I was able to get into the subintimal tract however in the mid calf this became extraluminal which was confirmed with a contrast injection with the balloon and the anterior tibial artery.  At this point I did not feel that the likelihood of success  was very high and so I elected to abort at this time.  The long sheath was exchanged out for a short 5 [place] sheath and a Celt was used for closure  Impression:  #1  No significant aortoiliac occlusive disease  #2  No significant outflow disease  #3  90% posterior tibial artery stenosis treated with a 2.5 mm  balloon  #4  Dominant runoff is the peroneal and posterior tibial artery.  The anterior tibial artery is occluded from most of its length with reconstitution distally on the foot.  There is diffuse disease out onto the foot.  Patient is maximally revascularized at this point   V. [name], M.D., [facility] of [place] Office: [phone] Pager:  [phone]

## 2024-01-16 NOTE — Interval H&P Note (Signed)
 History and Physical Interval Note:  01/16/2024 7:44 AM  [name]  has presented today for surgery, with the diagnosis of Athersclerosis right lower extremity with ulcer.  The various methods of treatment have been discussed with the patient and family. After consideration of risks, benefits and other options for treatment, the patient has consented to  Procedure(s): ABDOMINAL AORTOGRAM (N/A) Lower Extremity Angiography (N/A) LOWER EXTREMITY INTERVENTION (N/A) as a surgical intervention.  The patient's history has been reviewed, patient examined, no change in status, stable for surgery.  I have reviewed the patient's chart and labs.  Questions were answered to the patient's satisfaction.     [name]

## 2024-01-17 ENCOUNTER — Encounter (HOSPITAL_COMMUNITY): Payer: Self-pay | Admitting: Surgery

## 2024-01-23 DIAGNOSIS — E1165 Type 2 diabetes mellitus with hyperglycemia: Secondary | ICD-10-CM | POA: Diagnosis not present

## 2024-01-26 ENCOUNTER — Other Ambulatory Visit: Payer: Self-pay

## 2024-01-26 ENCOUNTER — Emergency Department (HOSPITAL_COMMUNITY)

## 2024-01-26 ENCOUNTER — Other Ambulatory Visit: Payer: Self-pay | Admitting: Family Medicine

## 2024-01-26 ENCOUNTER — Inpatient Hospital Stay (HOSPITAL_COMMUNITY)
Admission: EM | Admit: 2024-01-26 | Discharge: 2024-01-29 | DRG: 082 | Disposition: A | Discharge status: Hospice | Attending: Pulmonary Disease | Admitting: Pulmonary Disease

## 2024-01-26 ENCOUNTER — Encounter (HOSPITAL_COMMUNITY): Payer: Self-pay | Admitting: Emergency Medicine

## 2024-01-26 DIAGNOSIS — I6782 Cerebral ischemia: Secondary | ICD-10-CM | POA: Diagnosis not present

## 2024-01-26 DIAGNOSIS — Y92511 Restaurant or cafe as the place of occurrence of the external cause: Secondary | ICD-10-CM | POA: Diagnosis not present

## 2024-01-26 DIAGNOSIS — Z515 Encounter for palliative care: Secondary | ICD-10-CM | POA: Diagnosis not present

## 2024-01-26 DIAGNOSIS — Z743 Need for continuous supervision: Secondary | ICD-10-CM | POA: Diagnosis not present

## 2024-01-26 DIAGNOSIS — S0083XA Contusion of other part of head, initial encounter: Secondary | ICD-10-CM | POA: Diagnosis not present

## 2024-01-26 DIAGNOSIS — Z66 Do not resuscitate: Secondary | ICD-10-CM | POA: Diagnosis present

## 2024-01-26 DIAGNOSIS — S066X0A Traumatic subarachnoid hemorrhage without loss of consciousness, initial encounter: Secondary | ICD-10-CM | POA: Diagnosis not present

## 2024-01-26 DIAGNOSIS — R9082 White matter disease, unspecified: Secondary | ICD-10-CM | POA: Diagnosis not present

## 2024-01-26 DIAGNOSIS — I5022 Chronic systolic (congestive) heart failure: Secondary | ICD-10-CM | POA: Diagnosis present

## 2024-01-26 DIAGNOSIS — Z7902 Long term (current) use of antithrombotics/antiplatelets: Secondary | ICD-10-CM

## 2024-01-26 DIAGNOSIS — E782 Mixed hyperlipidemia: Secondary | ICD-10-CM | POA: Diagnosis not present

## 2024-01-26 DIAGNOSIS — I5020 Unspecified systolic (congestive) heart failure: Secondary | ICD-10-CM | POA: Diagnosis not present

## 2024-01-26 DIAGNOSIS — S065XAA Traumatic subdural hemorrhage with loss of consciousness status unknown, initial encounter: Secondary | ICD-10-CM | POA: Diagnosis present

## 2024-01-26 DIAGNOSIS — S06369A Traumatic hemorrhage of cerebrum, unspecified, with loss of consciousness of unspecified duration, initial encounter: Secondary | ICD-10-CM | POA: Diagnosis not present

## 2024-01-26 DIAGNOSIS — I110 Hypertensive heart disease with heart failure: Secondary | ICD-10-CM | POA: Diagnosis not present

## 2024-01-26 DIAGNOSIS — I611 Nontraumatic intracerebral hemorrhage in hemisphere, cortical: Secondary | ICD-10-CM | POA: Diagnosis not present

## 2024-01-26 DIAGNOSIS — Z8249 Family history of ischemic heart disease and other diseases of the circulatory system: Secondary | ICD-10-CM

## 2024-01-26 DIAGNOSIS — R0989 Other specified symptoms and signs involving the circulatory and respiratory systems: Secondary | ICD-10-CM | POA: Diagnosis not present

## 2024-01-26 DIAGNOSIS — F0390 Unspecified dementia without behavioral disturbance: Secondary | ICD-10-CM | POA: Diagnosis not present

## 2024-01-26 DIAGNOSIS — Z7982 Long term (current) use of aspirin: Secondary | ICD-10-CM

## 2024-01-26 DIAGNOSIS — Z833 Family history of diabetes mellitus: Secondary | ICD-10-CM

## 2024-01-26 DIAGNOSIS — Z89431 Acquired absence of right foot: Secondary | ICD-10-CM

## 2024-01-26 DIAGNOSIS — S0990XA Unspecified injury of head, initial encounter: Secondary | ICD-10-CM | POA: Diagnosis not present

## 2024-01-26 DIAGNOSIS — F32A Depression, unspecified: Secondary | ICD-10-CM | POA: Diagnosis not present

## 2024-01-26 DIAGNOSIS — Z800 Family history of malignant neoplasm of digestive organs: Secondary | ICD-10-CM

## 2024-01-26 DIAGNOSIS — R000 Tachycardia, unspecified: Secondary | ICD-10-CM | POA: Diagnosis not present

## 2024-01-26 DIAGNOSIS — I70203 Unspecified atherosclerosis of native arteries of extremities, bilateral legs: Secondary | ICD-10-CM | POA: Diagnosis not present

## 2024-01-26 DIAGNOSIS — Z7189 Other specified counseling: Secondary | ICD-10-CM | POA: Diagnosis not present

## 2024-01-26 DIAGNOSIS — Z79899 Other long term (current) drug therapy: Secondary | ICD-10-CM

## 2024-01-26 DIAGNOSIS — Z981 Arthrodesis status: Secondary | ICD-10-CM | POA: Diagnosis not present

## 2024-01-26 DIAGNOSIS — M1990 Unspecified osteoarthritis, unspecified site: Secondary | ICD-10-CM | POA: Diagnosis present

## 2024-01-26 DIAGNOSIS — M5030 Other cervical disc degeneration, unspecified cervical region: Secondary | ICD-10-CM | POA: Diagnosis not present

## 2024-01-26 DIAGNOSIS — S0633AA Contusion and laceration of cerebrum, unspecified, with loss of consciousness status unknown, initial encounter: Principal | ICD-10-CM | POA: Diagnosis present

## 2024-01-26 DIAGNOSIS — S0634AA Traumatic hemorrhage of right cerebrum with loss of consciousness status unknown, initial encounter: Principal | ICD-10-CM | POA: Diagnosis present

## 2024-01-26 DIAGNOSIS — G935 Compression of brain: Secondary | ICD-10-CM | POA: Diagnosis not present

## 2024-01-26 DIAGNOSIS — I429 Cardiomyopathy, unspecified: Secondary | ICD-10-CM | POA: Diagnosis present

## 2024-01-26 DIAGNOSIS — Z801 Family history of malignant neoplasm of trachea, bronchus and lung: Secondary | ICD-10-CM

## 2024-01-26 DIAGNOSIS — S199XXA Unspecified injury of neck, initial encounter: Secondary | ICD-10-CM | POA: Diagnosis not present

## 2024-01-26 DIAGNOSIS — R296 Repeated falls: Secondary | ICD-10-CM | POA: Diagnosis present

## 2024-01-26 DIAGNOSIS — I2510 Atherosclerotic heart disease of native coronary artery without angina pectoris: Secondary | ICD-10-CM | POA: Diagnosis not present

## 2024-01-26 DIAGNOSIS — Z87891 Personal history of nicotine dependence: Secondary | ICD-10-CM

## 2024-01-26 DIAGNOSIS — E1169 Type 2 diabetes mellitus with other specified complication: Secondary | ICD-10-CM | POA: Diagnosis not present

## 2024-01-26 DIAGNOSIS — R531 Weakness: Secondary | ICD-10-CM | POA: Diagnosis not present

## 2024-01-26 DIAGNOSIS — I517 Cardiomegaly: Secondary | ICD-10-CM | POA: Diagnosis not present

## 2024-01-26 DIAGNOSIS — M109 Gout, unspecified: Secondary | ICD-10-CM | POA: Diagnosis present

## 2024-01-26 DIAGNOSIS — E1151 Type 2 diabetes mellitus with diabetic peripheral angiopathy without gangrene: Secondary | ICD-10-CM | POA: Diagnosis not present

## 2024-01-26 DIAGNOSIS — G9389 Other specified disorders of brain: Secondary | ICD-10-CM | POA: Diagnosis not present

## 2024-01-26 DIAGNOSIS — Z781 Physical restraint status: Secondary | ICD-10-CM | POA: Diagnosis not present

## 2024-01-26 DIAGNOSIS — Z6830 Body mass index (BMI) 30.0-30.9, adult: Secondary | ICD-10-CM

## 2024-01-26 DIAGNOSIS — E669 Obesity, unspecified: Secondary | ICD-10-CM | POA: Diagnosis present

## 2024-01-26 DIAGNOSIS — I619 Nontraumatic intracerebral hemorrhage, unspecified: Secondary | ICD-10-CM | POA: Diagnosis present

## 2024-01-26 DIAGNOSIS — W19XXXA Unspecified fall, initial encounter: Secondary | ICD-10-CM | POA: Diagnosis present

## 2024-01-26 DIAGNOSIS — R4182 Altered mental status, unspecified: Secondary | ICD-10-CM | POA: Diagnosis not present

## 2024-01-26 DIAGNOSIS — G9340 Encephalopathy, unspecified: Secondary | ICD-10-CM | POA: Diagnosis not present

## 2024-01-26 DIAGNOSIS — E119 Type 2 diabetes mellitus without complications: Secondary | ICD-10-CM | POA: Diagnosis not present

## 2024-01-26 DIAGNOSIS — R9431 Abnormal electrocardiogram [ECG] [EKG]: Secondary | ICD-10-CM | POA: Diagnosis not present

## 2024-01-26 DIAGNOSIS — G936 Cerebral edema: Secondary | ICD-10-CM | POA: Diagnosis present

## 2024-01-26 DIAGNOSIS — Z8673 Personal history of transient ischemic attack (TIA), and cerebral infarction without residual deficits: Secondary | ICD-10-CM

## 2024-01-26 DIAGNOSIS — Z888 Allergy status to other drugs, medicaments and biological substances status: Secondary | ICD-10-CM

## 2024-01-26 DIAGNOSIS — I252 Old myocardial infarction: Secondary | ICD-10-CM

## 2024-01-26 DIAGNOSIS — Z885 Allergy status to narcotic agent status: Secondary | ICD-10-CM

## 2024-01-26 DIAGNOSIS — Z7984 Long term (current) use of oral hypoglycemic drugs: Secondary | ICD-10-CM

## 2024-01-26 DIAGNOSIS — Z955 Presence of coronary angioplasty implant and graft: Secondary | ICD-10-CM

## 2024-01-26 DIAGNOSIS — Z4682 Encounter for fitting and adjustment of non-vascular catheter: Secondary | ICD-10-CM | POA: Diagnosis not present

## 2024-01-26 DIAGNOSIS — I10 Essential (primary) hypertension: Secondary | ICD-10-CM | POA: Diagnosis not present

## 2024-01-26 LAB — CBC WITH DIFFERENTIAL/PLATELET
Abs Immature Granulocytes: 0.07 10*3/uL (ref 0.00–0.07)
Basophils Absolute: 0.1 10*3/uL (ref 0.0–0.1)
Basophils Relative: 1 %
Eosinophils Absolute: 0.2 10*3/uL (ref 0.0–0.5)
Eosinophils Relative: 2 %
HCT: 34.2 % — ABNORMAL LOW (ref 39.0–52.0)
Hemoglobin: 11.1 g/dL — ABNORMAL LOW (ref 13.0–17.0)
Immature Granulocytes: 1 %
Lymphocytes Relative: 8 %
Lymphs Abs: 0.7 10*3/uL (ref 0.7–4.0)
MCH: 28.2 pg (ref 26.0–34.0)
MCHC: 32.5 g/dL (ref 30.0–36.0)
MCV: 86.8 fL (ref 80.0–100.0)
Monocytes Absolute: 0.7 10*3/uL (ref 0.1–1.0)
Monocytes Relative: 7 %
Neutro Abs: 7.7 10*3/uL (ref 1.7–7.7)
Neutrophils Relative %: 81 %
Platelets: 233 10*3/uL (ref 150–400)
RBC: 3.94 MIL/uL — ABNORMAL LOW (ref 4.22–5.81)
RDW: 12.9 % (ref 11.5–15.5)
WBC: 9.4 10*3/uL (ref 4.0–10.5)
nRBC: 0 % (ref 0.0–0.2)

## 2024-01-26 LAB — URINALYSIS, ROUTINE W REFLEX MICROSCOPIC
Bilirubin Urine: NEGATIVE
Glucose, UA: NEGATIVE mg/dL
Ketones, ur: NEGATIVE mg/dL
Leukocytes,Ua: NEGATIVE
Nitrite: NEGATIVE
Protein, ur: 30 mg/dL — AB
Specific Gravity, Urine: 1.008 (ref 1.005–1.030)
pH: 5 (ref 5.0–8.0)

## 2024-01-26 LAB — COMPREHENSIVE METABOLIC PANEL WITH GFR
ALT: 25 U/L (ref 0–44)
AST: 24 U/L (ref 15–41)
Albumin: 4 g/dL (ref 3.5–5.0)
Alkaline Phosphatase: 74 U/L (ref 38–126)
Anion gap: 12 (ref 5–15)
BUN: 18 mg/dL (ref 8–23)
CO2: 24 mmol/L (ref 22–32)
Calcium: 9 mg/dL (ref 8.9–10.3)
Chloride: 99 mmol/L (ref 98–111)
Creatinine, Ser: 1.02 mg/dL (ref 0.61–1.24)
GFR, Estimated: >60 mL/min (ref >60)
Glucose, Bld: 172 mg/dL — ABNORMAL HIGH (ref 70–99)
Potassium: 3.9 mmol/L (ref 3.5–5.1)
Sodium: 135 mmol/L (ref 135–145)
Total Bilirubin: 0.9 mg/dL (ref 0.0–1.2)
Total Protein: 7.1 g/dL (ref 6.5–8.1)

## 2024-01-26 LAB — RAPID URINE DRUG SCREEN, HOSP PERFORMED
Amphetamines: NOT DETECTED
Barbiturates: NOT DETECTED
Benzodiazepines: NOT DETECTED
Cocaine: NOT DETECTED
Opiates: NOT DETECTED
Tetrahydrocannabinol: POSITIVE — AB

## 2024-01-26 LAB — CBG MONITORING, ED: Glucose-Capillary: 168 mg/dL — ABNORMAL HIGH (ref 70–99)

## 2024-01-26 MED ORDER — SODIUM CHLORIDE 0.9 % IV SOLN
INTRAVENOUS | Status: AC | PRN
Start: 2024-01-26 — End: 2024-01-27

## 2024-01-26 MED ORDER — LORAZEPAM 2 MG/ML IJ SOLN
0.5000 mg | Freq: Once | INTRAMUSCULAR | Status: AC
  Administered 2024-01-26: 0.5 mg via INTRAVENOUS
  Filled 2024-01-26: qty 1

## 2024-01-26 MED ORDER — STERILE WATER FOR INJECTION IJ SOLN
INTRAMUSCULAR | Status: AC
  Filled 2024-01-26: qty 10

## 2024-01-26 MED ORDER — ORAL CARE MOUTH RINSE
15.0000 mL | OROMUCOSAL | Status: DC | PRN
  Administered 2024-01-27: 15 mL via OROMUCOSAL

## 2024-01-26 MED ORDER — ZIPRASIDONE MESYLATE 20 MG IM SOLR
10.0000 mg | Freq: Once | INTRAMUSCULAR | Status: AC
  Administered 2024-01-26: 10 mg via INTRAMUSCULAR
  Filled 2024-01-26: qty 20

## 2024-01-26 MED ORDER — CHLORHEXIDINE GLUCONATE CLOTH 2 % EX PADS
6.0000 | MEDICATED_PAD | Freq: Every day | CUTANEOUS | Status: DC
  Administered 2024-01-27 – 2024-01-28 (×2): 6 via TOPICAL

## 2024-01-26 NOTE — Progress Notes (Signed)
 eLink Physician-Brief Progress Note Patient Name: [name] DOB: 11/25/1955 MRN: [id number]   Date of Service  01/26/2024  HPI/Events of Note  Patient needs safety restraints swapped out for soft wrist restraints.  eICU Interventions  Orders entered.        Migdalia Dk 01/26/2024, 11:47 PM

## 2024-01-26 NOTE — ED Notes (Signed)
 Pt care taken, is back from ct. Resting at this time. Obtained oral temperature. Is still restless but is able to follow some commands.

## 2024-01-26 NOTE — ED Triage Notes (Signed)
 Pt coming from local establishment via [facility] with reports of fall and AMS. Wife reports she let patient out of the car to go to the restroom, 15 minutes passed and wife went in to check on patient and found patient on the bathroom floor. Pt has hematoma to the left forehead.

## 2024-01-26 NOTE — ED Notes (Signed)
 Pt care taken, has gone to ct,.

## 2024-01-26 NOTE — H&P (Signed)
 [name] is an 68 y.o. male.   Chief Complaint: Right frontal hemorrhage HPI: 68 year old male in poor general health with history of progressive dementia also with history of coronary artery disease and atherosclerotic peripheral vascular disease.  Patient chronically on Plavix.  Patient suffered a fall while in the bathroom in the restaurant.  He was found minimally responsive by his wife.  Transferred to [facility].  Head CT scan demonstrates evidence of a right frontal hemorrhage most consistent with traumatic contusion.  Patient has been hemodynamically stable.  No history of seizure.  No history of significant hypoxia.  The patient's wife states that he has been progressively and declining over the last few months.  Past Medical History:  Diagnosis Date   CAD (coronary artery disease)    a. s/p BMS to RCA in 2008 b. no ischemia by NST in 03/2021   Cardiomyopathy [facility])    CHF (congestive heart failure) (HCC)    a. EF 30-35% in 2019 with similar results by repeat echo in 03/2021   Depression    DJD (degenerative joint disease)    DM type 2 with diabetic mixed hyperlipidemia (HCC) 12/11/2018   Essential hypertension    Gout    History of inferior wall myocardial infarction 2008   Hypercholesterolemia    Hyperlipidemia    Obesity    Oral candidiasis    Peripheral vascular disease (HCC)    Stroke [facility])    December 2019   Type 2 diabetes mellitus [facility])     Past Surgical History:  Procedure Laterality Date   ABDOMINAL AORTOGRAM N/A 01/16/2024   Procedure: ABDOMINAL AORTOGRAM;  Surgeon: Nada Libman, MD;  Location: [facility] INVASIVE CV LAB;  Service: Cardiovascular;  Laterality: N/A;   AMPUTATION Right 01/12/2015   Procedure: PARTIAL AMPUTATION 1ST RAY RIGHT FOOT (Amputation of right great toe and portion of the first metatarsal bone right foot);  Surgeon: Ferman Hamming, DPM;  Location: [facility] ORS;  Service: Podiatry;  Laterality: Right;   AMPUTATION Right 09/14/2016    Procedure: PARTIAL AMPUTATION RAY 2ND TOE RIGHT FOOT;  Surgeon: Ferman Hamming, DPM;  Location: [facility] ORS;  Service: Podiatry;  Laterality: Right;   COLONOSCOPY  08/2008   COLONOSCOPY WITH PROPOFOL N/A 06/02/2020   Procedure: COLONOSCOPY WITH PROPOFOL;  Surgeon: Lanelle Bal, DO;  Location: [facility] ENDO SUITE;  Service: Endoscopy;  Laterality: N/A;  7:30am   ESOPHAGOGASTRODUODENOSCOPY  08/2008   LAMINOTOMY  2001, 2006     Right L4-5 interlaminar laminotomy with excision of herniated   disc with operating microscope.   LOWER EXTREMITY ANGIOGRAPHY Right 01/16/2024   Procedure: Lower Extremity Angiography;  Surgeon: Nada Libman, MD;  Location: [facility] INVASIVE CV LAB;  Service: Cardiovascular;  Laterality: Right;   LOWER EXTREMITY INTERVENTION Right 01/16/2024   Procedure: LOWER EXTREMITY INTERVENTION;  Surgeon: Nada Libman, MD;  Location: [facility] INVASIVE CV LAB;  Service: Cardiovascular;  Laterality: Right;   NECK SURGERY     cervical disc   ORIF WRIST FRACTURE Right    PERIPHERAL VASCULAR CATHETERIZATION N/A 02/27/2015   Procedure: Abdominal Aortogram;  Surgeon: Sherren Kerns, MD;  Location: [facility] INVASIVE CV LAB;  Service: Cardiovascular;  Laterality: N/A;   PERIPHERAL VASCULAR CATHETERIZATION Bilateral 02/27/2015   Procedure: Lower Extremity Angiography;  Surgeon: Sherren Kerns, MD;  Location: [facility] INVASIVE CV LAB;  Service: Cardiovascular;  Laterality: Bilateral;   POLYPECTOMY  06/02/2020   Procedure: POLYPECTOMY;  Surgeon: Lanelle Bal, DO;  Location: [facility] ENDO [facility];  Service: Endoscopy;;   Right ankle fracture open reduction internal fixation.  2003   WOUND DEBRIDEMENT Right 12/24/2014   Procedure: DEBRIDEMENT SOFT TISSUE AND BONE RIGHT FOOT;  Surgeon: Ferman Hamming, DPM;  Location: [facility] ORS;  Service: Podiatry;  Laterality: Right;    Family History  Problem Relation Age of Onset   Diabetes Mother    Colon cancer Mother    Diabetes Father    Lung cancer Father    Cancer Sister     Diabetes Sister    Heart disease Sister        before age 54   Social History:  reports that he quit smoking about 53 years ago. His smoking use included cigarettes. He started smoking about 63 years ago. He has a 10 pack-year smoking history. He has never used smokeless tobacco. He reports current drug use. Drug: Marijuana. He reports that he does not drink alcohol.  Allergies:  Allergies  Allergen Reactions   Metformin And Related     GI symptoms   Statins Other (See Comments)    Legs ache, he states Crestor cause shoulder pain, states Crestor caused rash on hands   Vicodin [Hydrocodone-Acetaminophen] Nausea Only    Medications Prior to Admission  Medication Sig Dispense Refill   aspirin EC 81 MG tablet Take 1 tablet (81 mg total) by mouth daily. Swallow whole. 30 tablet 12   atorvastatin (LIPITOR) 10 MG tablet TAKE ONE TABLET BY MOUTH ON MONDAY AND FRIDAY 24 tablet 2   betamethasone valerate ointment (VALISONE) 0.1 % Apply 1 Application topically 2 (two) times daily as needed (Psoriasis). 45 g 5   clopidogrel (PLAVIX) 75 MG tablet Take 1 tablet (75 mg total) by mouth daily. 30 tablet 11   Continuous Glucose Receiver (FREESTYLE LIBRE 3 READER) DEVI with compatible Freestyle Libre 3 sensor to monitor glucose continuously. Patient can also use cell phone if compatible; DX: E11.65 1 each 1   Continuous Glucose Sensor (FREESTYLE LIBRE 3 PLUS SENSOR) MISC Change sensor every 15 days. DX: E11.65 2 each 5   doxycycline (VIBRA-TABS) 100 MG tablet Take 100 mg by mouth 2 (two) times daily.     furosemide (LASIX) 20 MG tablet TAKE 1 TABLET BY MOUTH EVERY DAY IN THE MORNING AS NEEDED 90 tablet 2   Insulin Pen Needle (B-D ULTRAFINE III SHORT PEN) 31G X 8 MM MISC USE PEN NEEDLES DAILY WITH LANTUS 100 each 5   memantine (NAMENDA) 5 MG tablet Take 1 tablet (5 mg total) by mouth 2 (two) times daily. 180 tablet 1   metFORMIN (GLUCOPHAGE-XR) 500 MG 24 hr tablet Take 1 tablet (500 mg total) by mouth daily  with breakfast. 90 tablet 1   metoprolol succinate (TOPROL-XL) 25 MG 24 hr tablet TAKE 1 TABLET (25 MG TOTAL) BY MOUTH DAILY. 90 tablet 3   Multiple Vitamins-Minerals (MULTIVITAMIN WITH MINERALS) tablet Take 1 tablet by mouth daily.     mupirocin ointment (BACTROBAN) 2 % Apply topically daily. Apply thin amount bid (Patient taking differently: Apply 1 Application topically daily as needed (Psoriasis).) 30 g 2   oxyCODONE-acetaminophen (PERCOCET) 10-325 MG tablet 1 taken 4 times daily as needed for pain 120 tablet 0   sertraline (ZOLOFT) 100 MG tablet Take 1 tablet (100 mg total) by mouth daily. 90 tablet 1   silver sulfADIAZINE (SILVADENE) 1 % cream Apply 1 Application topically daily. (Patient taking differently: Apply 1 Application topically daily as needed (Wound).) 50 g 6   TRESIBA FLEXTOUCH 100 UNIT/ML FlexTouch Pen  Inject 56 Units into the skin daily.     valsartan (DIOVAN) 40 MG tablet Take 1 tablet (40 mg total) by mouth daily. 90 tablet 1    Results for orders placed or performed during the hospital encounter of 01/26/24 (from the past 48 hours)  CBG monitoring, ED     Status: Abnormal   Collection Time: 01/26/24  6:00 PM  Result Value Ref Range   Glucose-Capillary 168 (H) 70 - 99 mg/dL    Comment: Glucose reference range applies only to samples taken after fasting for at least 8 hours.  CBC with Differential     Status: Abnormal   Collection Time: 01/26/24  6:13 PM  Result Value Ref Range   WBC 9.4 4.0 - 10.5 K/uL   RBC 3.94 (L) 4.22 - 5.81 MIL/uL   Hemoglobin 11.1 (L) 13.0 - 17.0 g/dL   HCT 16.1 (L) 09.6 - 04.5 %   MCV 86.8 80.0 - 100.0 fL   MCH 28.2 26.0 - 34.0 pg   MCHC 32.5 30.0 - 36.0 g/dL   RDW 40.9 81.1 - 91.4 %   Platelets 233 150 - 400 K/uL   nRBC 0.0 0.0 - 0.2 %   Neutrophils Relative % 81 %   Neutro Abs 7.7 1.7 - 7.7 K/uL   Lymphocytes Relative 8 %   Lymphs Abs 0.7 0.7 - 4.0 K/uL   Monocytes Relative 7 %   Monocytes Absolute 0.7 0.1 - 1.0 K/uL   Eosinophils  Relative 2 %   Eosinophils Absolute 0.2 0.0 - 0.5 K/uL   Basophils Relative 1 %   Basophils Absolute 0.1 0.0 - 0.1 K/uL   Immature Granulocytes 1 %   Abs Immature Granulocytes 0.07 0.00 - 0.07 K/uL    Comment: Performed at [facility], [address], [place], [place] [address]  Comprehensive metabolic panel     Status: Abnormal   Collection Time: 01/26/24  6:13 PM  Result Value Ref Range   Sodium 135 135 - 145 mmol/L   Potassium 3.9 3.5 - 5.1 mmol/L   Chloride 99 98 - 111 mmol/L   CO2 24 22 - 32 mmol/L   Glucose, Bld 172 (H) 70 - 99 mg/dL    Comment: Glucose reference range applies only to samples taken after fasting for at least 8 hours.   BUN 18 8 - 23 mg/dL   Creatinine, Ser 6.21 0.61 - 1.24 mg/dL   Calcium 9.0 8.9 - 30.8 mg/dL   Total Protein 7.1 6.5 - 8.1 g/dL   Albumin 4.0 3.5 - 5.0 g/dL   AST 24 15 - 41 U/L   ALT 25 0 - 44 U/L   Alkaline Phosphatase 74 38 - 126 U/L   Total Bilirubin 0.9 0.0 - 1.2 mg/dL   GFR, Estimated >65 >78 mL/min    Comment: (NOTE) Calculated using the CKD-EPI Creatinine Equation (2021)    Anion gap 12 5 - 15    Comment: Performed at [facility], [address], [place], [place] [address]  Urinalysis, Routine w reflex microscopic -Urine, Clean Catch     Status: Abnormal   Collection Time: 01/26/24  6:13 PM  Result Value Ref Range   Color, Urine YELLOW YELLOW   APPearance CLEAR CLEAR   Specific Gravity, Urine 1.008 1.005 - 1.030   pH 5.0 5.0 - 8.0   Glucose, UA NEGATIVE NEGATIVE mg/dL   Hgb urine dipstick SMALL (A) NEGATIVE   Bilirubin Urine NEGATIVE NEGATIVE   Ketones, ur NEGATIVE NEGATIVE mg/dL  Protein, ur 30 (A) NEGATIVE mg/dL   Nitrite NEGATIVE NEGATIVE   Leukocytes,Ua NEGATIVE NEGATIVE   RBC / HPF 11-20 0 - 5 RBC/hpf   WBC, UA 0-5 0 - 5 WBC/hpf   Bacteria, UA RARE (A) NONE SEEN   Squamous Epithelial / HPF 0-5 0 - 5 /HPF   Mucus PRESENT    Sperm, UA PRESENT     Comment: Performed at [facility], [address], [place], [place]  [address]  Rapid urine drug screen (hospital performed)     Status: Abnormal   Collection Time: 01/26/24  6:23 PM  Result Value Ref Range   Opiates NONE DETECTED NONE DETECTED   Cocaine NONE DETECTED NONE DETECTED   Benzodiazepines NONE DETECTED NONE DETECTED   Amphetamines NONE DETECTED NONE DETECTED   Tetrahydrocannabinol POSITIVE (A) NONE DETECTED   Barbiturates NONE DETECTED NONE DETECTED    Comment: (NOTE) DRUG SCREEN FOR MEDICAL PURPOSES ONLY.  IF CONFIRMATION IS NEEDED FOR ANY PURPOSE, NOTIFY LAB WITHIN 5 DAYS.  LOWEST DETECTABLE LIMITS FOR URINE DRUG SCREEN Drug Class                     Cutoff (ng/mL) Amphetamine and metabolites    1000 Barbiturate and metabolites    200 Benzodiazepine                 200 Opiates and metabolites        300 Cocaine and metabolites        300 THC                            50 Performed at [facility], [address] [place], [place] [address]    CT Cervical Spine Wo Contrast Result Date: 01/26/2024 CLINICAL DATA:  Neck trauma (Age >= 65y).  Fall. EXAM: CT CERVICAL SPINE WITHOUT CONTRAST TECHNIQUE: Multidetector CT imaging of the cervical spine was performed without intravenous contrast. Multiplanar CT image reconstructions were also generated. RADIATION DOSE REDUCTION: This exam was performed according to the departmental dose-optimization program which includes automated exposure control, adjustment of the mA and/or kV according to patient size and/or use of iterative reconstruction technique. COMPARISON:  Plain films 04/26/2011 FINDINGS: Alignment: Normal Skull base and vertebrae: No acute fracture. No primary bone lesion or focal pathologic process. Soft tissues and spinal canal: No prevertebral fluid or swelling. No visible canal hematoma. Disc levels: Prior ACDF at C5-6. Degenerative disc changes above and below the fusion level. Mild degenerative facet disease. Upper chest: No acute findings Other: None IMPRESSION: Degenerative and  postoperative changes.  No acute bony abnormality. Electronically Signed   By: Charlett Nose M.D.   On: 01/26/2024 20:22   DG Chest Port 1 View Result Date: 01/26/2024 CLINICAL DATA:  Weakness EXAM: PORTABLE CHEST 1 VIEW COMPARISON:  03/23/2022 FINDINGS: Cardiomegaly, vascular congestion. No confluent opacities, effusions or edema. No acute bony abnormality. IMPRESSION: Cardiomegaly, vascular congestion. Electronically Signed   By: Charlett Nose M.D.   On: 01/26/2024 20:20   CT Head Wo Contrast Result Date: 01/26/2024 CLINICAL DATA:  Initial evaluation for acute trauma. EXAM: CT HEAD WITHOUT CONTRAST TECHNIQUE: Contiguous axial images were obtained from the base of the skull through the vertex without intravenous contrast. RADIATION DOSE REDUCTION: This exam was performed according to the departmental dose-optimization program which includes automated exposure control, adjustment of the mA and/or kV according to patient size and/or use of iterative reconstruction technique. COMPARISON:  Prior study  from 11/08/2022 FINDINGS: Brain: Cerebral volume within normal limits. Moderately advanced chronic microvascular ischemic disease. Intraparenchymal hemorrhage positioned at the anterior right frontal lobe measures approximately 4.2 x 2.6 x 2.8 cm (estimated volume 15 mL). Associated small volume subarachnoid hemorrhage within the adjacent right frontal region. Localized mass effect without significant midline shift. Question additional subtle parenchymal hyperdensity at the left occipital lobe, which could reflect an additional small amount of hemorrhage (series 2, image 19). Finding not entirely certain, and could potentially be artifactual. No other acute intracranial hemorrhage. No acute large vessel territory infarct. No mass lesion. Ventricular prominence related to global parenchymal volume loss without hydrocephalus. No significant extra-axial fluid collection. Vascular: No abnormal hyperdense vessel. Scattered  vascular calcifications noted within the carotid siphons. Skull: Soft tissue contusion present at the left forehead. No calvarial fracture. Sinuses/Orbits: Globes orbital soft tissues within normal limits. Paranasal sinuses and mastoid air cells are largely clear. Other: None. Traumatic Brain Injury Risk Stratification Skull Fracture: No - Low/mBIG 1 Subdural Hematoma (SDH): No - Low Subarachnoid Hemorrhage [facility]): trace (up to 2 sulci) - Low/mBIG 1 Epidural Hematoma (EDH): No - Low/mBIG 1 Cerebral contusion, intra-axial, intraparenchymal Hemorrhage (IPH): 8mm plus or multiple - High/mBIG 3 Intraventricular Hemorrhage (IVH): No - Low/mBIG 1 Midline Shift > 1mm or Edema/effacement of sulci/vents: No - Low/mBIG 1 ---------------------------------------------------- IMPRESSION: 1. 4.2 x 2.6 x 2.8 cm intraparenchymal hemorrhage at the anterior right frontal lobe (estimated volume 15 mL). Associated small volume subarachnoid hemorrhage within the adjacent right frontal region. Localized mass effect without significant midline shift. 2. Question additional subtle parenchymal hyperdensity at the left occipital lobe, which could reflect an additional small amount of hemorrhage. Finding is not entirely certain, and could potentially be artifactual. Attention at follow-up recommended. 3. Soft tissue contusion at the left forehead. No calvarial fracture. 4. Underlying moderately advanced chronic microvascular ischemic disease. Critical Value/emergent results were called by telephone at the time of interpretation on 01/26/2024 at 7:55 pm to provider JOSEPH ZAMMIT , who verbally acknowledged these results. Electronically Signed   By: Rise Mu M.D.   On: 01/26/2024 19:57    Pertinent items noted in HPI and remainder of comprehensive ROS otherwise negative.  Blood pressure 139/80, pulse 87, temperature 98.9 F (37.2 C), resp. rate 15, SpO2 94%.  Patient is somnolent.  He will not awaken to noxious stimuli.  He is  nonverbal.  He has some semipurposeful movements of both upper extremities.  His pupils are midposition and reactive bilaterally.  He has somewhat of a downward gaze.  He appears to protect his airway reasonably.  Chest and abdomen are benign.  Extremities reveal extensive vascular disease status post toe amputations. Assessment/Plan Status post fall with right frontal contusion complicated by Plavix.  Patient in for overall health.  Had discussion with patient's wife.  Should he worsen she would not want him resuscitated or intubated but would like [place] to perform all reasonable care short of this.  Should his hemorrhage worsen she is not sure that she would want undergo surgery.  [name] 01/26/2024, 11:54 PM

## 2024-01-26 NOTE — ED Provider Notes (Signed)
 [facility] EMERGENCY DEPARTMENT AT [facility] Provider Note   CSN: [id number] Arrival date & time: 01/26/24  1749     History  Chief Complaint  Patient presents with   Altered Mental Status    [name] is a 68 y.o. male.  Patient has a history of coronary disease hypertension diabetes.  He is taking Plavix and aspirin.  He fell in the bathroom and hit his head.  Patient has been confused ever since.  The history is provided by medical records and a relative. No language interpreter was used.  Altered Mental Status Presenting symptoms: behavior changes   Severity:  Moderate Most recent episode:  Today Episode history:  Continuous Timing:  Constant Progression:  Worsening Chronicity:  New Context: not alcohol use   Associated symptoms: no abdominal pain        Home Medications Prior to Admission medications   Medication Sig Start Date End Date Taking? Authorizing Provider  aspirin EC 81 MG tablet Take 1 tablet (81 mg total) by mouth daily. Swallow whole. 01/16/24   Margherita Shell, MD  atorvastatin (LIPITOR) 10 MG tablet TAKE ONE TABLET BY MOUTH ON MONDAY AND FRIDAY 09/01/23   Bennet Brasil, MD  betamethasone valerate ointment (VALISONE) 0.1 % Apply 1 Application topically 2 (two) times daily as needed (Psoriasis). 03/15/23   Bennet Brasil, MD  clopidogrel (PLAVIX) 75 MG tablet Take 1 tablet (75 mg total) by mouth daily. 01/10/23   Bennet Brasil, MD  Continuous Glucose Receiver (FREESTYLE LIBRE 3 READER) DEVI with compatible Freestyle Libre 3 sensor to monitor glucose continuously. Patient can also use cell phone if compatible; DX: E11.65 07/12/23   Luking, Jackelyn Marvel, MD  Continuous Glucose Sensor (FREESTYLE LIBRE 3 PLUS SENSOR) MISC Change sensor every 15 days. DX: E11.65 07/12/23   Bennet Brasil, MD  doxycycline (VIBRA-TABS) 100 MG tablet Take 100 mg by mouth 2 (two) times daily. 01/08/24   [provider]  furosemide (LASIX) 20 MG tablet TAKE 1  TABLET BY MOUTH EVERY DAY IN THE MORNING AS NEEDED 07/27/23   Bennet Brasil, MD  Insulin Pen Needle (B-D ULTRAFINE III SHORT PEN) 31G X 8 MM MISC USE PEN NEEDLES DAILY WITH LANTUS 01/13/22   Luking, Jackelyn Marvel, MD  memantine (NAMENDA) 5 MG tablet Take 1 tablet (5 mg total) by mouth 2 (two) times daily. 09/01/23   Bennet Brasil, MD  metFORMIN (GLUCOPHAGE-XR) 500 MG 24 hr tablet Take 1 tablet (500 mg total) by mouth daily with breakfast. 09/01/23   Bennet Brasil, MD  metoprolol succinate (TOPROL-XL) 25 MG 24 hr tablet TAKE 1 TABLET (25 MG TOTAL) BY MOUTH DAILY. 01/26/24   Bennet Brasil, MD  Multiple Vitamins-Minerals (MULTIVITAMIN WITH MINERALS) tablet Take 1 tablet by mouth daily.    [provider]  mupirocin ointment (BACTROBAN) 2 % Apply topically daily. Apply thin amount bid Patient taking differently: Apply 1 Application topically daily as needed (Psoriasis). 09/01/23   Bennet Brasil, MD  oxyCODONE-acetaminophen (PERCOCET) 10-325 MG tablet 1 taken 4 times daily as needed for pain 12/14/23   Charlotta Cook A, MD  sertraline (ZOLOFT) 100 MG tablet Take 1 tablet (100 mg total) by mouth daily. 12/14/23   Bennet Brasil, MD  silver sulfADIAZINE (SILVADENE) 1 % cream Apply 1 Application topically daily. Patient taking differently: Apply 1 Application topically daily as needed (Wound). 05/23/22   Bennet Brasil, MD  TRESIBA FLEXTOUCH 100 UNIT/ML FlexTouch Pen Inject  56 Units into the skin daily. 06/01/23   [provider]  valsartan (DIOVAN) 40 MG tablet Take 1 tablet (40 mg total) by mouth daily. 09/01/23   Bennet Brasil, MD      Allergies    Metformin and related, Statins, and Vicodin [hydrocodone-acetaminophen]    Review of Systems   Review of Systems  Unable to perform ROS: Mental status change  Gastrointestinal:  Negative for abdominal pain.    Physical Exam Updated Vital Signs BP 136/85   Pulse 88   Temp 98.1 F (36.7 C)   Resp 20   SpO2 98%  Physical  Exam Vitals and nursing note reviewed.  Constitutional:      Appearance: He is well-developed.  HENT:     Head: Normocephalic.  Eyes:     General: No scleral icterus.    Conjunctiva/sclera: Conjunctivae normal.  Neck:     Thyroid: No thyromegaly.  Cardiovascular:     Rate and Rhythm: Normal rate and regular rhythm.     Heart sounds: No murmur heard.    No friction rub. No gallop.  Pulmonary:     Breath sounds: No stridor. No wheezing or rales.  Chest:     Chest wall: No tenderness.  Abdominal:     General: There is no distension.     Tenderness: There is no abdominal tenderness. There is no rebound.  Musculoskeletal:        General: Normal range of motion.     Cervical back: Neck supple.  Lymphadenopathy:     Cervical: No cervical adenopathy.  Skin:    Findings: No erythema or rash.  Neurological:     Mental Status: He is alert.     Motor: No abnormal muscle tone.     Coordination: Coordination normal.     Comments: Patient able to say a few words but is not oriented at all     ED Results / Procedures / Treatments   Labs (all labs ordered are listed, but only abnormal results are displayed) Labs Reviewed  CBC WITH DIFFERENTIAL/PLATELET - Abnormal; Notable for the following components:      Result Value   RBC 3.94 (*)    Hemoglobin 11.1 (*)    HCT 34.2 (*)    All other components within normal limits  COMPREHENSIVE METABOLIC PANEL WITH GFR - Abnormal; Notable for the following components:   Glucose, Bld 172 (*)    All other components within normal limits  URINALYSIS, ROUTINE W REFLEX MICROSCOPIC - Abnormal; Notable for the following components:   Hgb urine dipstick SMALL (*)    Protein, ur 30 (*)    Bacteria, UA RARE (*)    All other components within normal limits  RAPID URINE DRUG SCREEN, HOSP PERFORMED - Abnormal; Notable for the following components:   Tetrahydrocannabinol POSITIVE (*)    All other components within normal limits  CBG MONITORING, ED -  Abnormal; Notable for the following components:   Glucose-Capillary 168 (*)    All other components within normal limits    EKG None  Radiology CT Head Wo Contrast Result Date: 01/26/2024 CLINICAL DATA:  Initial evaluation for acute trauma. EXAM: CT HEAD WITHOUT CONTRAST TECHNIQUE: Contiguous axial images were obtained from the base of the skull through the vertex without intravenous contrast. RADIATION DOSE REDUCTION: This exam was performed according to the departmental dose-optimization program which includes automated exposure control, adjustment of the mA and/or kV according to patient size and/or use of iterative reconstruction technique. COMPARISON:  Prior study from 11/08/2022 FINDINGS: Brain: Cerebral volume within normal limits. Moderately advanced chronic microvascular ischemic disease. Intraparenchymal hemorrhage positioned at the anterior right frontal lobe measures approximately 4.2 x 2.6 x 2.8 cm (estimated volume 15 mL). Associated small volume subarachnoid hemorrhage within the adjacent right frontal region. Localized mass effect without significant midline shift. Question additional subtle parenchymal hyperdensity at the left occipital lobe, which could reflect an additional small amount of hemorrhage (series 2, image 19). Finding not entirely certain, and could potentially be artifactual. No other acute intracranial hemorrhage. No acute large vessel territory infarct. No mass lesion. Ventricular prominence related to global parenchymal volume loss without hydrocephalus. No significant extra-axial fluid collection. Vascular: No abnormal hyperdense vessel. Scattered vascular calcifications noted within the carotid siphons. Skull: Soft tissue contusion present at the left forehead. No calvarial fracture. Sinuses/Orbits: Globes orbital soft tissues within normal limits. Paranasal sinuses and mastoid air cells are largely clear. Other: None. Traumatic Brain Injury Risk Stratification Skull  Fracture: No - Low/mBIG 1 Subdural Hematoma (SDH): No - Low Subarachnoid Hemorrhage [facility]): trace (up to 2 sulci) - Low/mBIG 1 Epidural Hematoma (EDH): No - Low/mBIG 1 Cerebral contusion, intra-axial, intraparenchymal Hemorrhage (IPH): 8mm plus or multiple - High/mBIG 3 Intraventricular Hemorrhage (IVH): No - Low/mBIG 1 Midline Shift > 1mm or Edema/effacement of sulci/vents: No - Low/mBIG 1 ---------------------------------------------------- IMPRESSION: 1. 4.2 x 2.6 x 2.8 cm intraparenchymal hemorrhage at the anterior right frontal lobe (estimated volume 15 mL). Associated small volume subarachnoid hemorrhage within the adjacent right frontal region. Localized mass effect without significant midline shift. 2. Question additional subtle parenchymal hyperdensity at the left occipital lobe, which could reflect an additional small amount of hemorrhage. Finding is not entirely certain, and could potentially be artifactual. Attention at follow-up recommended. 3. Soft tissue contusion at the left forehead. No calvarial fracture. 4. Underlying moderately advanced chronic microvascular ischemic disease. Critical Value/emergent results were called by telephone at the time of interpretation on 01/26/2024 at 7:55 pm to provider Orella Cushman , who verbally acknowledged these results. Electronically Signed   By: Virgia Griffins M.D.   On: 01/26/2024 19:57    Procedures Procedures    Medications Ordered in ED Medications  ziprasidone (GEODON) injection 10 mg (10 mg Intramuscular Given 01/26/24 1820)  sterile water (preservative free) injection (  Given 01/26/24 1902)    ED Course/ Medical Decision Making/ A&P  CT scan shows intraparenchymal hemorrhage.  I spoke with neurosurgery Dr. Gwendlyn Lemmings and he requested the patient be transferred to the ICU over at [facility] and he will be the admitting doctor {  CRITICAL CARE Performed by: Cheyenne Cotta Total critical care time: 56 minutes Critical care time was exclusive  of separately billable procedures and treating other patients. Critical care was necessary to treat or prevent imminent or life-threatening deterioration. Critical care was time spent personally by me on the following activities: development of treatment plan with patient and/or surrogate as well as nursing, discussions with consultants, evaluation of patient's response to treatment, examination of patient, obtaining history from patient or surrogate, ordering and performing treatments and interventions, ordering and review of laboratory studies, ordering and review of radiographic studies, pulse oximetry and re-evaluation of patient's condition.                               Medical Decision Making Amount and/or Complexity of Data Reviewed Labs: ordered. Radiology: ordered.  Risk Prescription drug management. Decision regarding hospitalization.  Fall with contusion to head and intraparenchymal hemorrhage.  Patient is going to be admitted to the [facility], ICU by Dr. Adonis Alamin   Final Clinical Impression(s) / ED Diagnoses Final diagnoses:  None    Rx / DC Orders ED Discharge Orders     None         Cheyenne Cotta, MD 01/27/24 1200

## 2024-01-27 ENCOUNTER — Inpatient Hospital Stay (HOSPITAL_COMMUNITY)

## 2024-01-27 DIAGNOSIS — S0990XA Unspecified injury of head, initial encounter: Secondary | ICD-10-CM | POA: Diagnosis not present

## 2024-01-27 DIAGNOSIS — E119 Type 2 diabetes mellitus without complications: Secondary | ICD-10-CM

## 2024-01-27 DIAGNOSIS — G9340 Encephalopathy, unspecified: Secondary | ICD-10-CM

## 2024-01-27 DIAGNOSIS — I619 Nontraumatic intracerebral hemorrhage, unspecified: Secondary | ICD-10-CM | POA: Diagnosis present

## 2024-01-27 DIAGNOSIS — I10 Essential (primary) hypertension: Secondary | ICD-10-CM

## 2024-01-27 LAB — PHOSPHORUS
Phosphorus: 3.5 mg/dL (ref 2.5–4.6)
Phosphorus: 2.7 mg/dL (ref 2.5–4.6)

## 2024-01-27 LAB — BASIC METABOLIC PANEL WITH GFR
Anion gap: 14 (ref 5–15)
BUN: 16 mg/dL (ref 8–23)
CO2: 21 mmol/L — ABNORMAL LOW (ref 22–32)
Calcium: 9 mg/dL (ref 8.9–10.3)
Chloride: 100 mmol/L (ref 98–111)
Creatinine, Ser: 0.98 mg/dL (ref 0.61–1.24)
GFR, Estimated: >60 mL/min (ref >60)
Glucose, Bld: 126 mg/dL — ABNORMAL HIGH (ref 70–99)
Potassium: 4 mmol/L (ref 3.5–5.1)
Sodium: 135 mmol/L (ref 135–145)

## 2024-01-27 LAB — CBC
HCT: 38.5 % — ABNORMAL LOW (ref 39.0–52.0)
Hemoglobin: 12.3 g/dL — ABNORMAL LOW (ref 13.0–17.0)
MCH: 28 pg (ref 26.0–34.0)
MCHC: 31.9 g/dL (ref 30.0–36.0)
MCV: 87.5 fL (ref 80.0–100.0)
Platelets: 239 10*3/uL (ref 150–400)
RBC: 4.4 MIL/uL (ref 4.22–5.81)
RDW: 12.9 % (ref 11.5–15.5)
WBC: 11.2 10*3/uL — ABNORMAL HIGH (ref 4.0–10.5)
nRBC: 0 % (ref 0.0–0.2)

## 2024-01-27 LAB — MAGNESIUM
Magnesium: 1.9 mg/dL (ref 1.7–2.4)
Magnesium: 1.7 mg/dL (ref 1.7–2.4)

## 2024-01-27 LAB — GLUCOSE, CAPILLARY
Glucose-Capillary: 108 mg/dL — ABNORMAL HIGH (ref 70–99)
Glucose-Capillary: 128 mg/dL — ABNORMAL HIGH (ref 70–99)
Glucose-Capillary: 130 mg/dL — ABNORMAL HIGH (ref 70–99)
Glucose-Capillary: 132 mg/dL — ABNORMAL HIGH (ref 70–99)
Glucose-Capillary: 137 mg/dL — ABNORMAL HIGH (ref 70–99)
Glucose-Capillary: 140 mg/dL — ABNORMAL HIGH (ref 70–99)
Glucose-Capillary: 141 mg/dL — ABNORMAL HIGH (ref 70–99)
Glucose-Capillary: 156 mg/dL — ABNORMAL HIGH (ref 70–99)

## 2024-01-27 LAB — MRSA NEXT GEN BY PCR, NASAL: MRSA by PCR Next Gen: NOT DETECTED

## 2024-01-27 LAB — HEMOGLOBIN A1C
Hgb A1c MFr Bld: 7.3 % — ABNORMAL HIGH (ref 4.8–5.6)
Mean Plasma Glucose: 162.81 mg/dL

## 2024-01-27 LAB — HIV ANTIBODY (ROUTINE TESTING W REFLEX): HIV Screen 4th Generation wRfx: NONREACTIVE

## 2024-01-27 MED ORDER — ONDANSETRON HCL 4 MG PO TABS: 4.0000 mg | ORAL_TABLET | Freq: Four times a day (QID) | ORAL | Status: DC | PRN

## 2024-01-27 MED ORDER — ONDANSETRON HCL 4 MG/2ML IJ SOLN
4.0000 mg | Freq: Four times a day (QID) | INTRAMUSCULAR | Status: DC | PRN
  Administered 2024-01-27: 4 mg via INTRAVENOUS
  Filled 2024-01-27: qty 2

## 2024-01-27 MED ORDER — PROSOURCE TF20 ENFIT COMPATIBL EN LIQD
60.0000 mL | Freq: Every day | ENTERAL | Status: DC
  Filled 2024-01-27: qty 60

## 2024-01-27 MED ORDER — OLANZAPINE 10 MG IM SOLR
5.0000 mg | Freq: Four times a day (QID) | INTRAMUSCULAR | Status: DC | PRN
  Administered 2024-01-28: 5 mg via INTRAVENOUS
  Filled 2024-01-27 (×2): qty 10

## 2024-01-27 MED ORDER — HYDROMORPHONE HCL 1 MG/ML IJ SOLN
0.5000 mg | INTRAMUSCULAR | Status: DC | PRN
  Administered 2024-01-27 – 2024-01-29 (×8): 0.5 mg via INTRAVENOUS
  Filled 2024-01-27: qty 1
  Filled 2024-01-27 (×3): qty 0.5
  Filled 2024-01-27 (×4): qty 1

## 2024-01-27 MED ORDER — JEVITY 1.5 CAL/FIBER PO LIQD
1000.0000 mL | ORAL | Status: DC
  Filled 2024-01-27 (×2): qty 1000

## 2024-01-27 MED ORDER — HYDROMORPHONE HCL 1 MG/ML IJ SOLN
0.5000 mg | INTRAMUSCULAR | Status: DC | PRN
  Administered 2024-01-27: 1 mg via INTRAVENOUS
  Filled 2024-01-27 (×2): qty 1

## 2024-01-27 MED ORDER — SODIUM CHLORIDE 0.9 % IV SOLN: INTRAVENOUS | Status: AC

## 2024-01-27 MED ORDER — LABETALOL HCL 5 MG/ML IV SOLN
10.0000 mg | INTRAVENOUS | Status: DC | PRN
  Administered 2024-01-28: 10 mg via INTRAVENOUS
  Filled 2024-01-27: qty 4

## 2024-01-27 MED ORDER — ACETAMINOPHEN 160 MG/5ML PO SOLN: 650.0000 mg | Freq: Four times a day (QID) | ORAL | Status: DC | PRN

## 2024-01-27 MED ORDER — VITAL HIGH PROTEIN PO LIQD: 1000.0000 mL | ORAL | Status: DC

## 2024-01-27 MED ORDER — INSULIN ASPART 100 UNIT/ML IJ SOLN
0.0000 [IU] | INTRAMUSCULAR | Status: AC
Start: 2024-01-27 — End: ?
  Administered 2024-01-27 – 2024-01-28 (×9): 2 [IU] via SUBCUTANEOUS

## 2024-01-27 MED ORDER — HYDRALAZINE HCL 20 MG/ML IJ SOLN: 10.0000 mg | INTRAMUSCULAR | Status: DC | PRN

## 2024-01-27 MED ORDER — PROSOURCE TF20 ENFIT COMPATIBL EN LIQD
60.0000 mL | Freq: Three times a day (TID) | ENTERAL | Status: DC
  Administered 2024-01-28: 60 mL

## 2024-01-27 NOTE — Plan of Care (Signed)
 Patient tx from [facility]. Requires moderate stimulation to arouse. Maintaining airway on 2L of O2. Only alert to self. Unable to follow commands. No blood pressure goals per Neurosurg. Moves all extremities. Wife updated on goals and plan of care. ICU status maintained.    Problem: Safety: Goal: Non-violent Restraint(s) Outcome: Progressing   Problem: Coping: Goal: Level of anxiety will decrease Outcome: Progressing   Problem: Nutrition: Goal: Adequate nutrition will be maintained Outcome: Not Progressing

## 2024-01-27 NOTE — Progress Notes (Addendum)
 E-link notified that patient's blood pressure has been above goal. RN requested PRN BP medication. E-link notified of patient agitation and restlessness; E-link notified that patient has been treated for pain. E-link notified of prolonged qtc. .51 currently.

## 2024-01-27 NOTE — Progress Notes (Addendum)
 1244- Pt had one episode of vomiting. This RN was about to start tube feeds, so contacted MD for further instructions. Per Dr. Agarwala (CCM), will wait until 1600 to start feeds. If no further episodes of emesis, will trickle tube feeds. Starting at 10 mL/h and will increase by 10 mL q8h. Will give PRN zofran now and continue to monitor.   1551- Pt had a second episode of emesis. NG connected to low intermittent suction with bilious output. MD notified. Per Dr. Agarwala, do not start tube feeds. Will await further instructions.   Ar[place]us Knoll, RN

## 2024-01-27 NOTE — Consult Note (Signed)
 NAME:  [name], MRN:  [id number], DOB:  09-Jun-1956, LOS: 1 ADMISSION DATE:  01/26/2024, CONSULTATION DATE: 01/27/2024 REFERRING MD:  Adonis Alamin - CNSA, CHIEF COMPLAINT: Cerebral contusion  HPI/course in hospital  68 year old man who presented with altered mental status after suffering a fall in a restaurant restroom. He was found to have a right frontal hemorrhage consistent with a hemorrhagic contusion, likely exacerbated by chronic Plavix administration.  Further Dr. Lawyer Pride conversation with the wife, the patient has been progressively declining at home due to progressive dementia.  Past Medical History   Past Medical History:  Diagnosis Date   CAD (coronary artery disease)    a. s/p BMS to RCA in 2008 b. no ischemia by NST in 03/2021   Cardiomyopathy [facility])    CHF (congestive heart failure) (HCC)    a. EF 30-35% in 2019 with similar results by repeat echo in 03/2021   Depression    DJD (degenerative joint disease)    DM type 2 with diabetic mixed hyperlipidemia (HCC) 12/11/2018   Essential hypertension    Gout    History of inferior wall myocardial infarction 2008   Hypercholesterolemia    Hyperlipidemia    Obesity    Oral candidiasis    Peripheral vascular disease (HCC)    Stroke [facility])    December 2019   Type 2 diabetes mellitus [facility])      Past Surgical History:  Procedure Laterality Date   ABDOMINAL AORTOGRAM N/A 01/16/2024   Procedure: ABDOMINAL AORTOGRAM;  Surgeon: Margherita Shell, MD;  Location: [facility] INVASIVE CV LAB;  Service: Cardiovascular;  Laterality: N/A;   AMPUTATION Right 01/12/2015   Procedure: PARTIAL AMPUTATION 1ST RAY RIGHT FOOT (Amputation of right great toe and portion of the first metatarsal bone right foot);  Surgeon: Iverson Market, DPM;  Location: [facility] ORS;  Service: Podiatry;  Laterality: Right;   AMPUTATION Right 09/14/2016   Procedure: PARTIAL AMPUTATION RAY 2ND TOE RIGHT FOOT;  Surgeon: Iverson Market, DPM;  Location: [facility] ORS;  Service: Podiatry;   Laterality: Right;   COLONOSCOPY  08/2008   COLONOSCOPY WITH PROPOFOL N/A 06/02/2020   Procedure: COLONOSCOPY WITH PROPOFOL;  Surgeon: Vinetta Greening, DO;  Location: [facility] ENDO SUITE;  Service: Endoscopy;  Laterality: N/A;  7:30am   ESOPHAGOGASTRODUODENOSCOPY  08/2008   LAMINOTOMY  2001, 2006     Right L4-5 interlaminar laminotomy with excision of herniated   disc with operating microscope.   LOWER EXTREMITY ANGIOGRAPHY Right 01/16/2024   Procedure: Lower Extremity Angiography;  Surgeon: Margherita Shell, MD;  Location: [facility] INVASIVE CV LAB;  Service: Cardiovascular;  Laterality: Right;   LOWER EXTREMITY INTERVENTION Right 01/16/2024   Procedure: LOWER EXTREMITY INTERVENTION;  Surgeon: Margherita Shell, MD;  Location: [facility] INVASIVE CV LAB;  Service: Cardiovascular;  Laterality: Right;   NECK SURGERY     cervical disc   ORIF WRIST FRACTURE Right    PERIPHERAL VASCULAR CATHETERIZATION N/A 02/27/2015   Procedure: Abdominal Aortogram;  Surgeon: Richrd Char, MD;  Location: [facility] INVASIVE CV LAB;  Service: Cardiovascular;  Laterality: N/A;   PERIPHERAL VASCULAR CATHETERIZATION Bilateral 02/27/2015   Procedure: Lower Extremity Angiography;  Surgeon: Richrd Char, MD;  Location: [facility] INVASIVE CV LAB;  Service: Cardiovascular;  Laterality: Bilateral;   POLYPECTOMY  06/02/2020   Procedure: POLYPECTOMY;  Surgeon: Vinetta Greening, DO;  Location: [facility] END[facility];  Service: Endoscopy;;   Right ankle fracture open reduction internal fixation.  2003   WOUND DEBRIDEMENT Right 12/24/2014   Procedure: DEBRIDEMENT  SOFT TISSUE AND BONE RIGHT FOOT;  Surgeon: Iverson Market, DPM;  Location: [facility] ORS;  Service: Podiatry;  Laterality: Right;     Review of Systems:   Review of Systems  Unable to perform ROS: Dementia    Social History   reports that he quit smoking about 53 years ago. His smoking use included cigarettes. He started smoking about 63 years ago. He has a 10 pack-year smoking history. He has never used  smokeless tobacco. He reports current drug use. Drug: Marijuana. He reports that he does not drink alcohol.   Family History   His family history includes Cancer in his sister; Colon cancer in his mother; Diabetes in his father, mother, and sister; Heart disease in his sister; Lung cancer in his father.   Allergies Allergies  Allergen Reactions   Metformin And Related     GI symptoms   Statins Other (See Comments)    Legs ache, he states Crestor cause shoulder pain, states Crestor caused rash on hands   Vicodin [Hydrocodone-Acetaminophen] Nausea Only     Home Medications  Prior to Admission medications   Medication Sig Start Date End Date Taking? Authorizing Provider  aspirin EC 81 MG tablet Take 1 tablet (81 mg total) by mouth daily. Swallow whole. 01/16/24   Margherita Shell, MD  atorvastatin (LIPITOR) 10 MG tablet TAKE ONE TABLET BY MOUTH ON MONDAY AND FRIDAY 09/01/23   Bennet Brasil, MD  betamethasone valerate ointment (VALISONE) 0.1 % Apply 1 Application topically 2 (two) times daily as needed (Psoriasis). 03/15/23   Bennet Brasil, MD  clopidogrel (PLAVIX) 75 MG tablet Take 1 tablet (75 mg total) by mouth daily. 01/10/23   Bennet Brasil, MD  Continuous Glucose Receiver (FREESTYLE LIBRE 3 READER) DEVI with compatible Freestyle Libre 3 sensor to monitor glucose continuously. Patient can also use cell phone if compatible; DX: E11.65 07/12/23   Luking, Jackelyn Marvel, MD  Continuous Glucose Sensor (FREESTYLE LIBRE 3 PLUS SENSOR) MISC Change sensor every 15 days. DX: E11.65 07/12/23   Bennet Brasil, MD  doxycycline (VIBRA-TABS) 100 MG tablet Take 100 mg by mouth 2 (two) times daily. 01/08/24   [provider]  furosemide (LASIX) 20 MG tablet TAKE 1 TABLET BY MOUTH EVERY DAY IN THE MORNING AS NEEDED 07/27/23   Bennet Brasil, MD  Insulin Pen Needle (B-D ULTRAFINE III SHORT PEN) 31G X 8 MM MISC USE PEN NEEDLES DAILY WITH LANTUS 01/13/22   Luking, Jackelyn Marvel, MD  memantine (NAMENDA) 5 MG  tablet Take 1 tablet (5 mg total) by mouth 2 (two) times daily. 09/01/23   Bennet Brasil, MD  metFORMIN (GLUCOPHAGE-XR) 500 MG 24 hr tablet Take 1 tablet (500 mg total) by mouth daily with breakfast. 09/01/23   Bennet Brasil, MD  metoprolol succinate (TOPROL-XL) 25 MG 24 hr tablet TAKE 1 TABLET (25 MG TOTAL) BY MOUTH DAILY. 01/26/24   Bennet Brasil, MD  Multiple Vitamins-Minerals (MULTIVITAMIN WITH MINERALS) tablet Take 1 tablet by mouth daily.    [provider]  mupirocin ointment (BACTROBAN) 2 % Apply topically daily. Apply thin amount bid Patient taking differently: Apply 1 Application topically daily as needed (Psoriasis). 09/01/23   Bennet Brasil, MD  oxyCODONE-acetaminophen (PERCOCET) 10-325 MG tablet 1 taken 4 times daily as needed for pain 12/14/23   Charlotta Cook A, MD  sertraline (ZOLOFT) 100 MG tablet Take 1 tablet (100 mg total) by mouth daily. 12/14/23   Bennet Brasil, MD  silver  sulfADIAZINE (SILVADENE) 1 % cream Apply 1 Application topically daily. Patient taking differently: Apply 1 Application topically daily as needed (Wound). 05/23/22   Luking, Jackelyn Marvel, MD  TRESIBA FLEXTOUCH 100 UNIT/ML FlexTouch Pen Inject 56 Units into the skin daily. 06/01/23   [provider]  valsartan (DIOVAN) 40 MG tablet Take 1 tablet (40 mg total) by mouth daily. 09/01/23   Bennet Brasil, MD     Interim history/subjective:  Remains calm off all sedatives.  Objective   Blood pressure 113/79, pulse 99, temperature 99.3 F (37.4 C), temperature source Axillary, resp. rate 19, SpO2 100%.        Intake/Output Summary (Last 24 hours) at 01/27/2024 1346 Last data filed at 01/27/2024 1307 Gross per 24 hour  Intake 256.02 ml  Output 1200 ml  Net -943.98 ml   There were no vitals filed for this visit.  Examination: Physical Exam Constitutional:      Appearance: Normal appearance. He is well-groomed.  HENT:     Head: Abrasion (Over left forehead) present.  Eyes:      General: Lids are normal.     Conjunctiva/sclera: Conjunctivae normal.  Cardiovascular:     Rate and Rhythm: Normal rate and regular rhythm.     Heart sounds: Normal heart sounds.  Pulmonary:     Effort: Pulmonary effort is normal.     Breath sounds: Normal breath sounds.  Abdominal:     General: Abdomen is flat.     Palpations: Abdomen is soft.  Genitourinary:    Comments: Foley in place Musculoskeletal:     Cervical back: Normal range of motion.  Neurological:     Mental Status: He is lethargic.     GCS: GCS eye subscore is 4. GCS verbal subscore is 2. GCS motor subscore is 4.     Cranial Nerves: No facial asymmetry.     Comments: No response to voice.  Withdraws to pain.  Psychiatric:        Behavior: Behavior is uncooperative.     Ancillary tests (personally reviewed)  CBC: Recent Labs  Lab 01/26/24 1813 01/27/24 0828  WBC 9.4 11.2*  NEUTROABS 7.7  --   HGB 11.1* 12.3*  HCT 34.2* 38.5*  MCV 86.8 87.5  PLT 233 239    Basic Metabolic Panel: Recent Labs  Lab 01/26/24 1813 01/27/24 0828 01/27/24 0840  NA 135 135  --   K 3.9 4.0  --   CL 99 100  --   CO2 24 21*  --   GLUCOSE 172* 126*  --   BUN 18 16  --   CREATININE 1.02 0.98  --   CALCIUM 9.0 9.0  --   MG  --   --  1.9  PHOS  --   --  3.5   GFR: Estimated Creatinine Clearance: 96.1 mL/min (by C-G formula based on SCr of 0.98 mg/dL). Recent Labs  Lab 01/26/24 1813 01/27/24 0828  WBC 9.4 11.2*    Liver Function Tests: Recent Labs  Lab 01/26/24 1813  AST 24  ALT 25  ALKPHOS 74  BILITOT 0.9  PROT 7.1  ALBUMIN 4.0   No results for input(s): "LIPASE", "AMYLASE" in the last 168 hours. No results for input(s): "AMMONIA" in the last 168 hours.  ABG    Component Value Date/Time   TCO2 25 01/16/2024 0635     Coagulation Profile: No results for input(s): "INR", "PROTIME" in the last 168 hours.  Cardiac Enzymes: No results for input(s): "CKTOTAL", "CKMB", "  CKMBINDEX", "TROPONINI" in the  last 168 hours.  HbA1C: HbA1c, POC (controlled diabetic range)  Date/Time Value Ref Range Status  08/25/2021 10:10 AM 7.4 (A) 0.0 - 7.0 % Final   Hgb A1c MFr Bld  Date/Time Value Ref Range Status  01/27/2024 08:28 AM 7.3 (H) 4.8 - 5.6 % Final    Comment:    (NOTE) Pre diabetes:          5.7%-6.4%  Diabetes:              >6.4%  Glycemic control for   <7.0% adults with diabetes   03/14/2023 11:30 AM 8.0 (H) <5.7 % of total Hgb Final    Comment:    For someone without known diabetes, a hemoglobin A1c value of 6.5% or greater indicates that they may have  diabetes and this should be confirmed with a follow-up  test. . For someone with known diabetes, a value <7% indicates  that their diabetes is well controlled and a value  greater than or equal to 7% indicates suboptimal  control. A1c targets should be individualized based on  duration of diabetes, age, comorbid conditions, and  other considerations. . Currently, no consensus exists regarding use of hemoglobin A1c for diagnosis of diabetes for children. .     CBG: Recent Labs  Lab 01/27/24 0021 01/27/24 0141 01/27/24 0354 01/27/24 0746 01/27/24 1158  GLUCAP 137* 156* 141* 130* 132*   Repeat CT head shows: Increased right frontal hemorrhage and swelling but only 3 mm of midline shift in the setting of atrophy. Small volume blood clot layering in the occipital horns.  Assessment & Plan:  Encephalopathic secondary to large frontal hematoma with hemorrhagic conversion secondary to fall in a patient with progressive functional decline due to dementia. Dementia Type 2 diabetes HFrEF with EF 30% Hypertension Hypercholesterolemia  Plan:  -While the present injury is likely survivable it is unlikely the patient will recover to his premorbid functional state which was already rapidly declining. -Patient is DNR/DNI as per neurosurgery's conversation with the patient's wife.  No neurosurgical intervention. -Given the  size of the injury and his poor premorbid condition the patient will not recover to a sufficient some functional state to return home and will require placement. -Will arrange for palliative care consultation.  A transition to comfort care may be appropriate. -Maintain systolic blood pressure 100-150 -Placed tube for feeding. -Minimal shift with significant atrophy to accommodate for edema.  No clear indication for 3% saline. -Can transfer out of ICU if exam remains stable over the next 48 hours.   Daily Goals Checklist  Pain/Anxiety/Delirium protocol (if indicated): Minimize pain medication VAP protocol (if indicated): Not indicated Respiratory support goals: Not indicated Blood pressure target: 100-150 systolic DVT prophylaxis: Start chemical prophylaxis tomorrow if exam remains stable. Nutritional status and feeding goals: Start tube feeds GI prophylaxis: Not indicated Fluid status goals: Allow autoregulation Urinary catheter: Condom catheter Central lines: None indicated Glucose control: Sliding scale insulin Mobility/therapy needs: Ambulation as tolerated Antibiotic de-escalation: No antibiotics Home medication reconciliation: Presently on hold Daily labs: As needed Code Status: DNR/DNI Family Communication: Per neurosurg on admission Disposition: ICU  CRITICAL CARE Performed by: Arlina Lair   Total critical care time: 35 minutes  Critical care time was exclusive of separately billable procedures and treating other patients.  Critical care was necessary to treat or prevent imminent or life-threatening deterioration.  Critical care was time spent personally by me on the following activities: development of treatment plan with patient and/or  surrogate as well as nursing, discussions with consultants, evaluation of patient's response to treatment, examination of patient, obtaining history from patient or surrogate, ordering and performing treatments and interventions,  ordering and review of laboratory studies, ordering and review of radiographic studies, pulse oximetry, re-evaluation of patient's condition and participation in multidisciplinary rounds.  Arlina Lair, MD [facility] ICU Physician [facility]  Pager: [phone] Mobile: [phone] After hours: [phone].   01/27/2024, 1:46 PM

## 2024-01-27 NOTE — Progress Notes (Signed)
 eLink Physician-Brief Progress Note Patient Name: [name] DOB: [birth year] MRN: [id number]   Date of Service  01/27/2024  HPI/Events of Note  Patient admitted with traumatic frontal lobe intraparenchymal hemorrhage following a fall at a restaurant, he is in the ICU for close neurological monitoring and neurosurgical / neurology consultation.  eICU Interventions  New Patient Evaluation.        Jabari Swoveland U Carlitos Bottino 01/27/2024, 12:04 AM

## 2024-01-27 NOTE — Progress Notes (Signed)
 Patient looks a little better this morning.  He is awake.  He will answer some simple questions but he is not oriented to place or time.  He follows commands bilaterally.  He is afebrile.  His vital signs are stable.  He had a follow-up head CT scan last night which demonstrates progression of his right frontal hemorrhage.  Is difficult to say whether this is secondary to posttraumatic contusion or possibly secondary to hemorrhagic infarct.  There is no significant mass effect with his hemorrhage.  Patient is a frail elderly man and very poor general health who has been failing secondary to vascular disease, cardiac disease and progressive dementia.  He now has evidence of his significant right frontal hemorrhage and possible underlying stroke.  I do not recommend any type of surgical intervention.  I recommend palliative care evaluation.  The patient may be mobilized as tolerated.  He does not require further imaging from my standpoint.

## 2024-01-27 NOTE — Progress Notes (Signed)
 Initial Nutrition Assessment  DOCUMENTATION CODES:   Not applicable  INTERVENTION:   Initiate tube feeding via NG tube: Jevity 1.5 at 55 ml/h (1320 ml per day). Start at 10 ml/h, increase by 10 ml q8h to goal rate of 55 ml/h. Prosource TF20 60 ml TID. Provides 2220 kcal, 144 gm protein, 1003 ml free water daily. Free water flushes 200 ml q4h for a total of 2203 ml free water daily (TF + flushes).  NUTRITION DIAGNOSIS:   Inadequate oral intake related to inability to eat as evidenced by NPO status.  GOAL:   Patient will meet greater than or equal to 90% of their needs  MONITOR:   TF tolerance, Diet advancement  REASON FOR ASSESSMENT:   Consult Enteral/tube feeding initiation and management  ASSESSMENT:   68 yo male admitted with R frontal hemorrhage S/P fall. PMH includes progressive dementia, CAD, PVD S/P toe amputations, cardiomyopathy, CHF, DJD, DM-2, HLD, HTN, stroke 2019.  CT scan last night showed progression of R frontal hemorrhage. Neurosurgery does not recommend surgical intervention at this time.   Currently NPO, not alert enough to safely take POs. NG tube placed by RN this morning. X-ray confirmed gastric placement. Received MD Consult for TF initiation and management.  Per discussion with RN, patient vomited just as she was getting ready to start his TF, so plans to give prn zofran, then start feedings at 1600 at 10 ml/h and increase by 10 ml q8h to goal.   Labs reviewed.  CBG: 141-130-132  Medications reviewed and include novolog. IVF: NS at 40 ml/h   Weight history reviewed. No significant weight changes noted over the past year.   NUTRITION - FOCUSED PHYSICAL EXAM:  Unable to complete  Diet Order:   Diet Order             Diet NPO time specified  Diet effective now                   EDUCATION NEEDS:   No education needs have been identified at this time  Skin:  Skin Assessment: Skin Integrity Issues: Skin Integrity Issues::  Diabetic Ulcer, Other (Comment) Diabetic Ulcer: 2 toes on R foot amputated; R heel ulcer Other: head laceration  Last BM:  PTA  Height:   Ht Readings from Last 1 Encounters:  01/16/24 6\' 3"  (1.905 m)    Weight:   Wt Readings from Last 1 Encounters:  01/16/24 112 kg    Ideal Body Weight:  89.1 kg  BMI:  30.9  Estimated Nutritional Needs:   Kcal:  2200-2400  Protein:  130-150 gm  Fluid:  2.2-2.4 L   Barnet Boots RD, LDN, CNSC Contact via secure chat. If unavailable, use group chat "RD Inpatient."

## 2024-01-27 NOTE — Progress Notes (Signed)
 eLink Physician-Brief Progress Note Patient Name: [name] DOB: [birth year] MRN: [id number]   Date of Service  01/27/2024  HPI/Events of Note  Patient with recurrent falls on antiplatelet agents that developed subdural hematoma with progressive IPH.  No neurosurgical intervention planned at this time.  Patient is getting restless with QT prolongation for 9 days  eICU Interventions  Add as needed antihypertensives for SBP goal 100-150  Would continue sedation if possible, initiate olanzapine up to To 3 doses IV     Intervention Category Minor Interventions: Routine modifications to care plan (e.g. PRN medications for pain, fever);Agitation / anxiety - evaluation and management  Ilma Achee 01/27/2024, 9:12 PM

## 2024-01-27 NOTE — Plan of Care (Signed)
  Problem: Clinical Measurements: Goal: Will remain free from infection Outcome: Progressing Goal: Cardiovascular complication will be avoided Outcome: Progressing   Problem: Activity: Goal: Risk for activity intolerance will decrease Outcome: Progressing   Problem: Nutrition: Goal: Adequate nutrition will be maintained Outcome: Progressing   Problem: Coping: Goal: Level of anxiety will decrease Outcome: Progressing   Problem: Elimination: Goal: Will not experience complications related to urinary retention Outcome: Progressing   Problem: Pain Managment: Goal: General experience of comfort will improve and/or be controlled Outcome: Progressing   Problem: Safety: Goal: Ability to remain free from injury will improve Outcome: Progressing   Problem: Skin Integrity: Goal: Risk for impaired skin integrity will decrease Outcome: Progressing   Problem: Fluid Volume: Goal: Ability to maintain a balanced intake and output will improve Outcome: Progressing   Problem: Health Behavior/Discharge Planning: Goal: Ability to identify and utilize available resources and services will improve Outcome: Progressing   Problem: Metabolic: Goal: Ability to maintain appropriate glucose levels will improve Outcome: Progressing   Problem: Nutritional: Goal: Maintenance of adequate nutrition will improve Outcome: Progressing Goal: Progress toward achieving an optimal weight will improve Outcome: Progressing   Problem: Skin Integrity: Goal: Risk for impaired skin integrity will decrease Outcome: Progressing   Problem: Tissue Perfusion: Goal: Adequacy of tissue perfusion will improve Outcome: Progressing   Problem: Safety: Goal: Non-violent Restraint(s) Outcome: Not Progressing   Problem: Education: Goal: Knowledge of General Education information will improve Description: Including pain rating scale, medication(s)/side effects and non-pharmacologic comfort measures Outcome: Not  Progressing   Problem: Health Behavior/Discharge Planning: Goal: Ability to manage health-related needs will improve Outcome: Not Progressing   Problem: Clinical Measurements: Goal: Ability to maintain clinical measurements within normal limits will improve Outcome: Not Progressing Goal: Diagnostic test results will improve Outcome: Not Progressing Goal: Respiratory complications will improve Outcome: Not Progressing   Problem: Elimination: Goal: Will not experience complications related to bowel motility Outcome: Not Progressing   Problem: Education: Goal: Ability to describe self-care measures that may prevent or decrease complications (Diabetes Survival Skills Education) will improve Outcome: Not Progressing Goal: Individualized Educational Video(s) Outcome: Not Progressing   Problem: Coping: Goal: Ability to adjust to condition or change in health will improve Outcome: Not Progressing   Problem: Health Behavior/Discharge Planning: Goal: Ability to manage health-related needs will improve Outcome: Not Progressing

## 2024-01-28 DIAGNOSIS — S0990XA Unspecified injury of head, initial encounter: Secondary | ICD-10-CM | POA: Diagnosis not present

## 2024-01-28 DIAGNOSIS — E119 Type 2 diabetes mellitus without complications: Secondary | ICD-10-CM | POA: Diagnosis not present

## 2024-01-28 DIAGNOSIS — I10 Essential (primary) hypertension: Secondary | ICD-10-CM | POA: Diagnosis not present

## 2024-01-28 DIAGNOSIS — G9340 Encephalopathy, unspecified: Secondary | ICD-10-CM | POA: Diagnosis not present

## 2024-01-28 LAB — PHOSPHORUS: Phosphorus: 4 mg/dL (ref 2.5–4.6)

## 2024-01-28 LAB — MAGNESIUM: Magnesium: 2 mg/dL (ref 1.7–2.4)

## 2024-01-28 LAB — GLUCOSE, CAPILLARY
Glucose-Capillary: 132 mg/dL — ABNORMAL HIGH (ref 70–99)
Glucose-Capillary: 126 mg/dL — ABNORMAL HIGH (ref 70–99)
Glucose-Capillary: 155 mg/dL — ABNORMAL HIGH (ref 70–99)

## 2024-01-28 MED ORDER — MEMANTINE HCL 5 MG PO TABS
5.0000 mg | ORAL_TABLET | Freq: Two times a day (BID) | ORAL | Status: DC
  Filled 2024-01-28 (×2): qty 1

## 2024-01-28 MED ORDER — SERTRALINE HCL 50 MG PO TABS: 100.0000 mg | ORAL_TABLET | Freq: Every day | ORAL | Status: DC

## 2024-01-28 MED ORDER — OLANZAPINE 10 MG IM SOLR
10.0000 mg | Freq: Two times a day (BID) | INTRAMUSCULAR | Status: DC
  Administered 2024-01-28: 10 mg via INTRAMUSCULAR
  Filled 2024-01-28 (×2): qty 10

## 2024-01-28 NOTE — Progress Notes (Signed)
 This RN confirmed with MD about comfort care pt's scheduled Zyprexa being IM. Called pharmacy to verify that route cannot be changed to IV. Per pharmacy, med can only be given orally or IM. MD prefers to use IM med bc pt will not have IV access when he transitions to outpatient hospice facility.   - Arbutus Knoll, RN

## 2024-01-28 NOTE — Consult Note (Signed)
 NAME:  [name], MRN:  [id number], DOB:  06/10/1956, LOS: 2 ADMISSION DATE:  01/26/2024, CONSULTATION DATE: 01/27/2024 REFERRING MD:  Adonis Alamin - CNSA, CHIEF COMPLAINT: Cerebral contusion  HPI/course in hospital  68 year old man who presented with altered mental status after suffering a fall in a restaurant restroom. He was found to have a right frontal hemorrhage consistent with a hemorrhagic contusion, likely exacerbated by chronic Plavix administration.  Further Dr. Lawyer Pride conversation with the wife, the patient has been progressively declining at home due to progressive dementia.  Past Medical History   Past Medical History:  Diagnosis Date   CAD (coronary artery disease)    a. s/p BMS to RCA in 2008 b. no ischemia by NST in 03/2021   Cardiomyopathy [facility])    CHF (congestive heart failure) (HCC)    a. EF 30-35% in 2019 with similar results by repeat echo in 03/2021   Depression    DJD (degenerative joint disease)    DM type 2 with diabetic mixed hyperlipidemia (HCC) 12/11/2018   Essential hypertension    Gout    History of inferior wall myocardial infarction 2008   Hypercholesterolemia    Hyperlipidemia    Obesity    Oral candidiasis    Peripheral vascular disease (HCC)    Stroke [facility])    December 2019   Type 2 diabetes mellitus [facility])      Past Surgical History:  Procedure Laterality Date   ABDOMINAL AORTOGRAM N/A 01/16/2024   Procedure: ABDOMINAL AORTOGRAM;  Surgeon: Margherita Shell, MD;  Location: [facility] INVASIVE CV LAB;  Service: Cardiovascular;  Laterality: N/A;   AMPUTATION Right 01/12/2015   Procedure: PARTIAL AMPUTATION 1ST RAY RIGHT FOOT (Amputation of right great toe and portion of the first metatarsal bone right foot);  Surgeon: Iverson Market, DPM;  Location: [facility] ORS;  Service: Podiatry;  Laterality: Right;   AMPUTATION Right 09/14/2016   Procedure: PARTIAL AMPUTATION RAY 2ND TOE RIGHT FOOT;  Surgeon: Iverson Market, DPM;  Location: [facility] ORS;  Service: Podiatry;   Laterality: Right;   COLONOSCOPY  08/2008   COLONOSCOPY WITH PROPOFOL N/A 06/02/2020   Procedure: COLONOSCOPY WITH PROPOFOL;  Surgeon: Vinetta Greening, DO;  Location: [facility] ENDO SUITE;  Service: Endoscopy;  Laterality: N/A;  7:30am   ESOPHAGOGASTRODUODENOSCOPY  08/2008   LAMINOTOMY  2001, 2006     Right L4-5 interlaminar laminotomy with excision of herniated   disc with operating microscope.   LOWER EXTREMITY ANGIOGRAPHY Right 01/16/2024   Procedure: Lower Extremity Angiography;  Surgeon: Margherita Shell, MD;  Location: [facility] INVASIVE CV LAB;  Service: Cardiovascular;  Laterality: Right;   LOWER EXTREMITY INTERVENTION Right 01/16/2024   Procedure: LOWER EXTREMITY INTERVENTION;  Surgeon: Margherita Shell, MD;  Location: [facility] INVASIVE CV LAB;  Service: Cardiovascular;  Laterality: Right;   NECK SURGERY     cervical disc   ORIF WRIST FRACTURE Right    PERIPHERAL VASCULAR CATHETERIZATION N/A 02/27/2015   Procedure: Abdominal Aortogram;  Surgeon: Richrd Char, MD;  Location: [facility] INVASIVE CV LAB;  Service: Cardiovascular;  Laterality: N/A;   PERIPHERAL VASCULAR CATHETERIZATION Bilateral 02/27/2015   Procedure: Lower Extremity Angiography;  Surgeon: Richrd Char, MD;  Location: [facility] INVASIVE CV LAB;  Service: Cardiovascular;  Laterality: Bilateral;   POLYPECTOMY  06/02/2020   Procedure: POLYPECTOMY;  Surgeon: Vinetta Greening, DO;  Location: [facility] ENDO SUITE;  Service: Endoscopy;;   Right ankle fracture open reduction internal fixation.  2003   WOUND DEBRIDEMENT Right 12/24/2014   Procedure: DEBRIDEMENT  SOFT TISSUE AND BONE RIGHT FOOT;  Surgeon: Iverson Market, DPM;  Location: [facility] ORS;  Service: Podiatry;  Laterality: Right;     Interim history/subjective:  Agitated but unaware. Good response to Olanzapine.  Objective   Blood pressure 131/83, pulse 83, temperature 100.2 F (37.9 C), temperature source Axillary, resp. rate 13, weight 107.8 kg, SpO2 100%.        Intake/Output Summary (Last 24 hours)  at 01/28/2024 1433 Last data filed at 01/28/2024 0700 Gross per 24 hour  Intake 678.21 ml  Output 1200 ml  Net -521.79 ml   Filed Weights   01/28/24 0500  Weight: 107.8 kg    Examination: Physical Exam Constitutional:      Appearance: Normal appearance. He is well-groomed.     Comments: Lying in bed. Agitated.   HENT:     Head: Abrasion (Over left forehead) present.     Comments: NGT in place with small amount of bloody drainage.  Eyes:     General: Lids are normal.     Conjunctiva/sclera: Conjunctivae normal.  Cardiovascular:     Rate and Rhythm: Normal rate and regular rhythm.     Heart sounds: Normal heart sounds.  Pulmonary:     Effort: Pulmonary effort is normal.     Breath sounds: Normal breath sounds.     Comments: Protecting airway. No distress Abdominal:     General: Abdomen is flat.     Palpations: Abdomen is soft.  Genitourinary:    Comments: Foley in place Musculoskeletal:     Cervical back: Normal range of motion.  Neurological:     Mental Status: He is lethargic and confused.     GCS: GCS eye subscore is 4. GCS verbal subscore is 2. GCS motor subscore is 4.     Cranial Nerves: No facial asymmetry.     Comments: No response to voice.  Moves around in bed semi-purposefully.  Psychiatric:        Behavior: Behavior is uncooperative.     Ancillary tests (personally reviewed)  CBC: Recent Labs  Lab 01/26/24 1813 01/27/24 0828  WBC 9.4 11.2*  NEUTROABS 7.7  --   HGB 11.1* 12.3*  HCT 34.2* 38.5*  MCV 86.8 87.5  PLT 233 239    Basic Metabolic Panel: Recent Labs  Lab 01/26/24 1813 01/27/24 0828 01/27/24 0840 01/27/24 1642 01/28/24 0705  NA 135 135  --   --   --   K 3.9 4.0  --   --   --   CL 99 100  --   --   --   CO2 24 21*  --   --   --   GLUCOSE 172* 126*  --   --   --   BUN 18 16  --   --   --   CREATININE 1.02 0.98  --   --   --   CALCIUM 9.0 9.0  --   --   --   MG  --   --  1.9 1.7 2.0  PHOS  --   --  3.5 2.7 4.0    GFR: Estimated Creatinine Clearance: 94.4 mL/min (by C-G formula based on SCr of 0.98 mg/dL). Recent Labs  Lab 01/26/24 1813 01/27/24 0828  WBC 9.4 11.2*    Liver Function Tests: Recent Labs  Lab 01/26/24 1813  AST 24  ALT 25  ALKPHOS 74  BILITOT 0.9  PROT 7.1  ALBUMIN 4.0   No results for input(s): "LIPASE", "AMYLASE" in the  last 168 hours. No results for input(s): "AMMONIA" in the last 168 hours.  ABG    Component Value Date/Time   TCO2 25 01/16/2024 0635     Coagulation Profile: No results for input(s): "INR", "PROTIME" in the last 168 hours.  Cardiac Enzymes: No results for input(s): "CKTOTAL", "CKMB", "CKMBINDEX", "TROPONINI" in the last 168 hours.  HbA1C: HbA1c, POC (controlled diabetic range)  Date/Time Value Ref Range Status  08/25/2021 10:10 AM 7.4 (A) 0.0 - 7.0 % Final   Hgb A1c MFr Bld  Date/Time Value Ref Range Status  01/27/2024 08:28 AM 7.3 (H) 4.8 - 5.6 % Final    Comment:    (NOTE) Pre diabetes:          5.7%-6.4%  Diabetes:              >6.4%  Glycemic control for   <7.0% adults with diabetes   03/14/2023 11:30 AM 8.0 (H) <5.7 % of total Hgb Final    Comment:    For someone without known diabetes, a hemoglobin A1c value of 6.5% or greater indicates that they may have  diabetes and this should be confirmed with a follow-up  test. . For someone with known diabetes, a value <7% indicates  that their diabetes is well controlled and a value  greater than or equal to 7% indicates suboptimal  control. A1c targets should be individualized based on  duration of diabetes, age, comorbid conditions, and  other considerations. . Currently, no consensus exists regarding use of hemoglobin A1c for diagnosis of diabetes for children. .     CBG: Recent Labs  Lab 01/27/24 2002 01/27/24 2350 01/28/24 0352 01/28/24 0734 01/28/24 1127  GLUCAP 128* 108* 132* 126* 155*   Repeat CT head shows: Increased right frontal hemorrhage and  swelling but only 3 mm of midline shift in the setting of atrophy. Small volume blood clot layering in the occipital horns.  Assessment & Plan:  Encephalopathic secondary to large frontal hematoma with hemorrhagic conversion secondary to fall in a patient with progressive functional decline due to dementia. Dementia Type 2 diabetes HFrEF with EF 30% Hypertension Hypercholesterolemia  Plan:  -While the present injury is likely survivable it is unlikely the patient will recover to his premorbid functional state which was already rapidly declining. -Patient is DNR/DNI as per neurosurgery's conversation with the patient's wife.  No neurosurgical intervention. -Given the size of the injury and his poor premorbid condition the patient will not recover to a sufficient some functional state to return home and will require placement. -Will arrange for palliative care consultation.  Family have requested a transition to comfort care. Will likely survive long enough to require institutional hospice care.  - Start scheduled olanzapine for agitation.  - Stop and remove NGT.  All other medications stopped.   Daily Goals Checklist  Pain/Anxiety/Delirium protocol (if indicated): Minimize pain medication VAP protocol (if indicated): Not indicated Respiratory support goals: Not indicated Blood pressure target: N/A DVT prophylaxis: N/A GI prophylaxis: Not indicated Fluid status goals: Allow autoregulation Urinary catheter: Condom catheter Central lines: None indicated Glucose control: N/A Mobility/therapy needs: Bedrest Antibiotic de-escalation: No antibiotics Home medication reconciliation: Presently on hold Daily labs: As needed Code Status: Comfort care DNR/DNI Family Communication: Family updated 4/13 Disposition: palliative care   Arlina Lair, MD [facility] ICU Physician [facility] [place] [facility]  Pager: [phone] Mobile: [phone] After hours: [phone].   01/28/2024, 2:33  PM

## 2024-01-28 NOTE — Progress Notes (Signed)
 Remains restless and agitated.  Minimally verbal.  Will follow commands with great prompting.  Strength appears equal.  Shoulder gentleman with progressive functional decline prior to recent fall.  Fall with significant right frontal contusion versus hemorrhagic infarct.  No plans for surgical intervention.  Working toward palliative care.

## 2024-01-28 NOTE — Plan of Care (Signed)
  Problem: Clinical Measurements: Goal: Ability to maintain clinical measurements within normal limits will improve Outcome: Progressing Goal: Will remain free from infection Outcome: Progressing Goal: Diagnostic test results will improve Outcome: Progressing Goal: Respiratory complications will improve Outcome: Progressing Goal: Cardiovascular complication will be avoided Outcome: Progressing   Problem: Activity: Goal: Risk for activity intolerance will decrease Outcome: Progressing   Problem: Coping: Goal: Level of anxiety will decrease Outcome: Progressing   Problem: Elimination: Goal: Will not experience complications related to urinary retention Outcome: Progressing   Problem: Pain Managment: Goal: General experience of comfort will improve and/or be controlled Outcome: Progressing   Problem: Safety: Goal: Ability to remain free from injury will improve Outcome: Progressing   Problem: Skin Integrity: Goal: Risk for impaired skin integrity will decrease Outcome: Progressing   Problem: Coping: Goal: Ability to adjust to condition or change in health will improve Outcome: Progressing   Problem: Fluid Volume: Goal: Ability to maintain a balanced intake and output will improve Outcome: Progressing   Problem: Health Behavior/Discharge Planning: Goal: Ability to manage health-related needs will improve Outcome: Progressing   Problem: Metabolic: Goal: Ability to maintain appropriate glucose levels will improve Outcome: Progressing   Problem: Nutritional: Goal: Progress toward achieving an optimal weight will improve Outcome: Progressing   Problem: Skin Integrity: Goal: Risk for impaired skin integrity will decrease Outcome: Progressing   Problem: Tissue Perfusion: Goal: Adequacy of tissue perfusion will improve Outcome: Progressing   Problem: Education: Goal: Knowledge of General Education information will improve Description: Including pain rating  scale, medication(s)/side effects and non-pharmacologic comfort measures Outcome: Not Progressing   Problem: Health Behavior/Discharge Planning: Goal: Ability to manage health-related needs will improve Outcome: Not Progressing   Problem: Nutrition: Goal: Adequate nutrition will be maintained Outcome: Not Progressing   Problem: Elimination: Goal: Will not experience complications related to bowel motility Outcome: Not Progressing   Problem: Education: Goal: Ability to describe self-care measures that may prevent or decrease complications (Diabetes Survival Skills Education) will improve Outcome: Not Progressing Goal: Individualized Educational Video(s) Outcome: Not Progressing   Problem: Health Behavior/Discharge Planning: Goal: Ability to identify and utilize available resources and services will improve Outcome: Not Progressing   Problem: Nutritional: Goal: Maintenance of adequate nutrition will improve Outcome: Not Progressing

## 2024-01-29 DIAGNOSIS — I10 Essential (primary) hypertension: Secondary | ICD-10-CM | POA: Diagnosis not present

## 2024-01-29 DIAGNOSIS — Z515 Encounter for palliative care: Secondary | ICD-10-CM

## 2024-01-29 DIAGNOSIS — I5020 Unspecified systolic (congestive) heart failure: Secondary | ICD-10-CM | POA: Diagnosis not present

## 2024-01-29 DIAGNOSIS — S0990XA Unspecified injury of head, initial encounter: Secondary | ICD-10-CM

## 2024-01-29 DIAGNOSIS — Z7189 Other specified counseling: Secondary | ICD-10-CM | POA: Diagnosis not present

## 2024-01-29 DIAGNOSIS — E119 Type 2 diabetes mellitus without complications: Secondary | ICD-10-CM | POA: Diagnosis not present

## 2024-01-29 MED ORDER — GLYCOPYRROLATE 0.2 MG/ML IJ SOLN
0.4000 mg | INTRAMUSCULAR | Status: DC
  Administered 2024-01-29: 0.4 mg via INTRAVENOUS
  Filled 2024-01-29: qty 2

## 2024-01-29 MED ORDER — MORPHINE 100MG IN NS 100ML (1MG/ML) PREMIX INFUSION
2.0000 mg/h | INTRAVENOUS | Status: DC
Start: 2024-01-29 — End: 2024-02-16

## 2024-01-29 MED ORDER — SCOPOLAMINE 1 MG/3DAYS TD PT72: 1.0000 | MEDICATED_PATCH | TRANSDERMAL | Status: DC

## 2024-01-29 MED ORDER — ONDANSETRON 4 MG PO TBDP: 4.0000 mg | ORAL_TABLET | Freq: Four times a day (QID) | ORAL | Status: DC | PRN

## 2024-01-29 MED ORDER — GLYCOPYRROLATE 0.2 MG/ML IJ SOLN
0.4000 mg | INTRAMUSCULAR | Status: DC | PRN
  Administered 2024-01-29: 0.4 mg via INTRAVENOUS
  Filled 2024-01-29: qty 2

## 2024-01-29 MED ORDER — ONDANSETRON HCL 4 MG/2ML IJ SOLN: 4.0000 mg | Freq: Four times a day (QID) | INTRAMUSCULAR | Status: DC | PRN

## 2024-01-29 MED ORDER — GLYCOPYRROLATE 0.2 MG/ML IJ SOLN: 0.4000 mg | INTRAMUSCULAR | Status: DC

## 2024-01-29 MED ORDER — SCOPOLAMINE 1 MG/3DAYS TD PT72
1.0000 | MEDICATED_PATCH | TRANSDERMAL | Status: DC
  Administered 2024-01-29: 1.5 mg via TRANSDERMAL
  Filled 2024-01-29 (×2): qty 1

## 2024-01-29 MED ORDER — MORPHINE 100MG IN NS 100ML (1MG/ML) PREMIX INFUSION
2.0000 mg/h | INTRAVENOUS | Status: DC
  Administered 2024-01-29: 2 mg/h via INTRAVENOUS
  Filled 2024-01-29: qty 100

## 2024-01-29 MED ORDER — MORPHINE BOLUS VIA INFUSION
2.0000 mg | INTRAVENOUS | Status: DC | PRN
Start: 2024-01-29 — End: 2024-02-16

## 2024-01-29 MED ORDER — OLANZAPINE 5 MG PO TBDP: 5.0000 mg | ORAL_TABLET | Freq: Two times a day (BID) | ORAL | Status: DC | PRN

## 2024-01-29 MED ORDER — POLYVINYL ALCOHOL 1.4 % OP SOLN: 1.0000 [drp] | Freq: Four times a day (QID) | OPHTHALMIC | Status: DC | PRN

## 2024-01-29 MED ORDER — MORPHINE BOLUS VIA INFUSION
2.0000 mg | INTRAVENOUS | Status: DC | PRN
  Administered 2024-01-29: 2 mg via INTRAVENOUS

## 2024-01-29 MED ORDER — POLYVINYL ALCOHOL 1.4 % OP SOLN
1.0000 [drp] | Freq: Four times a day (QID) | OPHTHALMIC | Status: DC | PRN
  Filled 2024-01-29: qty 15

## 2024-01-29 MED ORDER — BIOTENE DRY MOUTH MT LIQD: 15.0000 mL | OROMUCOSAL | Status: DC | PRN

## 2024-01-29 MED ORDER — HYDROMORPHONE HCL 1 MG/ML IJ SOLN
0.5000 mg | INTRAMUSCULAR | Status: DC | PRN
Start: 2024-01-29 — End: 2024-02-16

## 2024-01-29 NOTE — Discharge Summary (Addendum)
 Physician Discharge Summary    Patient ID: [name] MRN: [id number] DOB/AGE: [birth year] 68 y.o.   Admit date: 01/26/2024 Discharge date: 01/29/2024   Problem List Principal Problem:   Cerebral contusion [facility]) Active Problems:   ICH (intracerebral hemorrhage) (HCC)   HPI:   Hospital Course: 68 year old man with significant past medical hx for dementia, CAD, cardiomyopathy, CHF, DM2, Essential HTN, gout, HLD, Obesity, depression, PVD, and CVA who presented with altered mental status after suffering a fall in a restaurant restroom.He was found to have a right frontal hemorrhage consistent with a hemorrhagic contusion, likely exacerbated by chronic Plavix administration.   Physical Exam:  General: actively dying older male, lying in bed- comfortable/no distress  HEENT: Normocephalic, PERRLA sluggish, 2+ BL , poor dentition  Pink dry mm  CV: s1,s2, no MRG, No JVD  pulm: rhonchus, diminished, no distress Abs: bs active, soft  Extremities: 2+ edema generalized, no movement Skin: no rash  Neuro: Rass -3, minimal response to painful stimuli  GU: male purewick        Assessment and Plan   Encephalopathic secondary to large frontal hematoma with hemorrhagic conversion secondary to fall in a patient with progressive functional decline due to dementia. Dementia Type 2 diabetes HFrEF with EF 30% Hypertension Hypercholesterolemia   DNR/DNI with comfort measures:    P: -Initiating morphine gtt with boluses per Palliative Team -Continue scopolamine patch for nausea and zofran  -Continue Robinol for secretions -Continue zyprexa for agitation  -Transfer to [facility] in [place]  -Continue spiritual care and unrestricted visitation   Labs at discharge Lab Results  Component Value Date   CREATININE 0.98 01/27/2024   BUN 16 01/27/2024   NA 135 01/27/2024   K 4.0 01/27/2024   CL 100 01/27/2024   CO2 21 (L) 01/27/2024   Lab Results  Component  Value Date   WBC 11.2 (H) 01/27/2024   HGB 12.3 (L) 01/27/2024   HCT 38.5 (L) 01/27/2024   MCV 87.5 01/27/2024   PLT 239 01/27/2024   Lab Results  Component Value Date   ALT 25 01/26/2024   AST 24 01/26/2024   ALKPHOS 74 01/26/2024   BILITOT 0.9 01/26/2024   Lab Results  Component Value Date   INR 1.0 12/17/2018   INR 1.01 09/18/2018   INR 1.4 03/24/2008    Current radiology studies No results found.  Disposition:  [facility] [place]    There are no questions and answers to display.         Allergies as of 01/29/2024       Reactions   Metformin And Related Other (See Comments)   GI symptoms   Statins Other (See Comments)   Legs ache, he states Crestor cause shoulder pain, states Crestor caused rash on hands   Vicodin [hydrocodone-acetaminophen] Nausea Only        Medication List     STOP taking these medications    aspirin EC 81 MG tablet   atorvastatin 10 MG tablet Commonly known as: LIPITOR   B-D ULTRAFINE III SHORT PEN 31G X 8 MM Misc Generic drug: Insulin Pen Needle   betamethasone valerate ointment 0.1 % Commonly known as: VALISONE   clopidogrel 75 MG tablet Commonly known as: PLAVIX   doxycycline 100 MG tablet Commonly known as: Engineer, mining 3 Plus Sensor Misc   FreeStyle Libre 3 Reader Devi   furosemide 20 MG tablet Commonly known as: LASIX   memantine  5 MG tablet Commonly known as: NAMENDA   metFORMIN 500 MG 24 hr tablet Commonly known as: GLUCOPHAGE-XR   metoprolol succinate 25 MG 24 hr tablet Commonly known as: TOPROL-XL   multivitamin with minerals tablet   mupirocin ointment 2 % Commonly known as: BACTROBAN   oxyCODONE-acetaminophen 10-325 MG tablet Commonly known as: PERCOCET   sertraline 100 MG tablet Commonly known as: ZOLOFT   silver sulfADIAZINE 1 % cream Commonly known as: SILVADENE   Tresiba FlexTouch 100 UNIT/ML FlexTouch Pen Generic drug: insulin  degludec   valsartan 40 MG tablet Commonly known as: DIOVAN       TAKE these medications    glycopyrrolate 0.2 MG/ML injection Commonly known as: ROBINUL Inject 2 mLs (0.4 mg total) into the vein every 4 (four) hours.   HYDROmorphone 1 MG/ML injection Commonly known as: DILAUDID Inject 0.5 mLs (0.5 mg total) into the vein every 2 (two) hours as needed for severe pain (pain score 7-10).   morphine 1 mg/mL Soln infusion Inject 2-10 mg/hr into the vein continuous.   morphine Soln Inject 2 mg into the vein every 10 (ten) minutes as needed (Pain/Shortness of breath).   OLANZapine zydis 5 MG disintegrating tablet Commonly known as: ZYPREXA Take 1 tablet (5 mg total) by mouth 2 (two) times daily as needed (agitation).   ondansetron 4 MG disintegrating tablet Commonly known as: ZOFRAN-ODT Take 1 tablet (4 mg total) by mouth every 6 (six) hours as needed for nausea.   ondansetron 4 MG/2ML Soln injection Commonly known as: ZOFRAN Inject 2 mLs (4 mg total) into the vein every 6 (six) hours as needed for nausea.   polyvinyl alcohol 1.4 % ophthalmic solution Commonly known as: LIQUIFILM TEARS Place 1 drop into both eyes 4 (four) times daily as needed for dry eyes.   scopolamine 1 MG/3DAYS Commonly known as: TRANSDERM-SCOP Place 1 patch (1.5 mg total) onto the skin every 3 (three) days. Start taking on: February 01, 2024          Discharged Condition: fair  Time spent on discharge greater than 40 minutes.  Vital signs at Discharge. Temp:  [98.6 F (37 C)-98.9 F (37.2 C)] 98.9 F (37.2 C) (04/14 0747) Pulse Rate:  [91-100] 93 (04/14 0747) Resp:  [14-30] 18 (04/14 0747) BP: (140-156)/(83-95) 144/83 (04/14 0747) SpO2:  [95 %-100 %] 95 % (04/14 0747) Office follow up Special Information or instructions.   Signed:   Rey Catholic AGACNP-BC   [facility] 01/29/2024, 5:02 PM  Please see [email or web address] for pager details.  From 7A-7P if no  response, please call [phone]. After hours, please call ELink [phone].

## 2024-01-29 NOTE — Plan of Care (Signed)

## 2024-01-29 NOTE — Progress Notes (Signed)
 Patient has begun palliative care intervention.  No plans for surgical intervention.  No indication for further imaging.  Please call me if I can be of any assistance otherwise.

## 2024-01-29 NOTE — Progress Notes (Signed)
 This chaplain responded to PMT NP-Michelle consult for EOL Pt. and family support.  The Pt. is sleeping at the time of the visit and family are no longer at the bedside. The chaplain will attempt a revisit at another time.  Chaplain Kathleene Papas [phone]

## 2024-01-29 NOTE — Care Management Important Message (Signed)
 Important Message  Patient Details  Name: [name] MRN: [id number] Date of Birth: [birth year]   Important Message Given:  Yes - Medicare IM     Wynonia Hedges 01/29/2024, 3:19 PM

## 2024-01-29 NOTE — TOC Initial Note (Addendum)
 Transition of Care [facility]) - Initial/Assessment Note    Patient Details  Name: [name] MRN: [id number] Date of Birth: 1956/02/01  Transition of Care [facility]) CM/SW Contact:    Erin Sons, LCSW Phone Number: 01/29/2024, 10:59 AM  Clinical Narrative:                  CSW notified that PMT that pt's family would like pt to go to [facility] facility for hospice care. CSW called pt's spouse and confirmed this. Briefly explained referral process.   Made referral to [facility](formerly [facility] They will have one of their nurses come assess pt today.   Expected Discharge Plan: Hospice Medical Facility Barriers to Discharge: No Barriers Identified        Prior Living Arrangements/Services     Patient language and need for interpreter reviewed:: Yes        Need for Family Participation in Patient Care: Yes (Comment) Care giver support system in place?: Yes (comment)   Criminal Activity/Legal Involvement Pertinent to Current Situation/Hospitalization: No - Comment as needed  Activities of Daily Living      Permission Sought/Granted                  Emotional Assessment         Alcohol / Substance Use: Not Applicable Psych Involvement: No (comment)  Admission diagnosis:  Cerebral contusion (HCC) [S06.33AA] ICH (intracerebral hemorrhage) (HCC) [I61.9] Patient Active Problem List   Diagnosis Date Noted   ICH (intracerebral hemorrhage) (HCC) 01/27/2024   Cerebral contusion (HCC) 01/26/2024   Vascular dementia without behavioral disturbance, psychotic disturbance, mood disturbance, or anxiety, unspecified dementia severity (HCC) 12/13/2023   Chronic HFrEF (heart failure with reduced ejection fraction) (HCC) 10/31/2022   Sepsis secondary to UTI (HCC) 03/24/2022   Orchitis 03/24/2022   Hypokalemia 03/24/2022   Acute metabolic encephalopathy 03/24/2022   Essential hypertension, benign 08/25/2021   Obesity (BMI 30-39.9) 12/30/2020   Encounter  for chronic pain management 01/29/2020   DM type 2 with diabetic mixed hyperlipidemia (HCC) 12/11/2018   Stroke (HCC) 09/18/2018   History of amputation of great toe (HCC) 02/20/2017   Diabetic foot infection (HCC) 09/13/2016   Peripheral vascular disease (HCC)    Osteoarthritis of both knees 03/12/2014   Mixed hyperlipidemia 11/28/2013   Chronic pain syndrome 11/28/2013   Diabetic peripheral neuropathy (HCC) 11/28/2013   DM type 2 causing vascular disease (HCC) 02/20/2013   Pain in joint, shoulder region 04/16/2012   Muscle weakness (generalized) 04/16/2012   Rotator cuff syndrome of left shoulder 04/16/2012   Coronary atherosclerosis of native coronary artery 03/09/2011   PCP:  Babs Sciara, MD Pharmacy:   [facility] [phone] - [place], [place] - [address] AT [facility] [address] [place] [address] Phone: [phone] Fax: [phone]     Social Drivers of Health (SDOH) Social History: SDOH Screenings   Food Insecurity: Patient Declined (01/27/2024)  Housing: Patient Declined (01/27/2024)  Transportation Needs: Patient Declined (01/27/2024)  Utilities: Patient Declined (01/27/2024)  Alcohol Screen: Low Risk  (06/09/2023)  Depression (PHQ2-9): High Risk (01/10/2024)  Financial Resource Strain: Low Risk  (06/09/2023)  Physical Activity: Insufficiently Active (06/09/2023)  Social Connections: Patient Declined (01/27/2024)  Stress: No Stress Concern Present (06/09/2023)  Tobacco Use: Medium Risk (01/26/2024)  Health Literacy: Adequate Health Literacy (06/09/2023)   SDOH Interventions:     Readmission Risk Interventions     No data to display

## 2024-01-29 NOTE — Consult Note (Signed)
 Palliative Medicine Inpatient Consult Note  Consulting Provider: Dr. Denese Killings  Reason for consult:  Dementia with declining mental status.  Now in with large frontal contusion.  Poor functional recovery likely.  01/29/2024  HPI:  Per intake H&P --> 68 year old man who presented with altered mental status after suffering a fall in a restaurant restroom. He was found to have a right frontal hemorrhage consistent with a hemorrhagic contusion, likely exacerbated by chronic Plavix administration. Further Dr. Lindalou Hose conversation with the wife, the patient has been progressively declining at home due to progressive dementia.  Palliative care has been asked to support additional goals of care conversations.   Clinical Assessment/Goals of Care:  *Please note that this is a verbal dictation therefore any spelling or grammatical errors are due to the "Dragon Medical One" system interpretation.  I have reviewed medical records including EPIC notes, labs and imaging, received report from bedside RN, assessed the patient who is tachypneic on examination.    I met with patient's wife Steward Drone and middle daughter to further discuss diagnosis prognosis, GOC, EOL wishes, disposition and options.   I introduced Palliative Medicine as specialized medical care for people living with serious illness. It focuses on providing relief from the symptoms and stress of a serious illness. The goal is to improve quality of life for both the patient and the family.  Medical History Review and Understanding:  A review of Teejay's past medical history significant for dementia, strokes, hypertension, coronary artery disease, myocardial infarction, and diabetes was completed  Social History:  Jasai is from [facility].  He and his wife have been married for 48 years.  He has 3 children and 5 grandchildren.  He formally worked at [employer] after coming back from [place].  He then worked for [employer].  His wife shares he loves to tinker and was an avid gardener.  He is man of the [place] faith.  Functional and Nutritional State:  Preceding hospitalization Sneijder required help with all B ADLs.  He is identified as a stubborn individual who would not allow his family to provide him with a walker and he would mobilize around the home slowly.  His wife shares that she helps with all other B ADLs.  Advance Directives:  A detailed discussion was had today regarding advanced directives.  Patient surrogate decision maker is his spouse Steward Drone.  Code Status:  Concepts specific to code status, artifical feeding and hydration, continued IV antibiotics and rehospitalization was had.  The difference between a aggressive medical intervention path  and a palliative comfort care path for this patient at this time was had.   [place] is an established DO NOT RESUSCITATE DO NOT INTUBATE CODE STATUS.  Discussion:  We discussed the circumstances of Williams fall and what the long-term of this would look like as per discussion with neurosurgery.  Patient's family understands that this is not intervenable and will likely lead to his demise within a relatively short period of time.    We talked about transition to comfort measures in house and what that would entail inclusive of medications to control pain, dyspnea, agitation, nausea, itching, and hiccups.  We discussed stopping all uneccessary measures such as cardiac monitoring, blood draws, needle sticks, and frequent vital signs.   We reviewed the day of starting a low-dose morphine drip to support dyspnea and pain.  Patient's family are in agreement with this.  The hope is for patient to transition to the  hospice home in [facility] which a referral has been made.  Goals at this time are to allow for comfort, dignity, and peace at the end of life.  Decision Maker: Tippett,Brenda (Spouse): [phone] ([place])    SUMMARY OF RECOMMENDATIONS   DNAR/DNI  Comfort care  Low-dose morphine drip with boluses will be initiated  Glycopyrrolate around-the-clock in addition to scopolamine patch  Additional comfort medications per [facility]  Unrestricted visitation  Appreciate TOC making referral to hospice of [facility] inpatient  Ongoing palliative care support  Code Status/Advance Care Planning: DNAR/DNI  Palliative Prophylaxis:  Aspiration, Bowel Regimen, Delirium Protocol, Frequent Pain Assessment, Oral Care, Palliative Wound Care, and Turn Reposition  Additional Recommendations (Limitations, Scope, Preferences): Continue current care  Psycho-social/Spiritual:  Desire for further Chaplaincy support: Yes Additional Recommendations: Education on EOL   Prognosis: Limited to days  Discharge Planning: To [facility]  Vitals:   01/29/24 0014 01/29/24 0747  BP: (!) 156/95 (!) 144/83  Pulse: 91 93  Resp: 18 18  Temp: 98.6 F (37 C) 98.9 F (37.2 C)  SpO2: 96% 95%    Intake/Output Summary (Last 24 hours) at 01/29/2024 0753 Last data filed at 01/28/2024 2200 Gross per 24 hour  Intake --  Output 850 ml  Net -850 ml   Last Weight  Most recent update: 01/28/2024  6:32 AM    Weight  107.8 kg (237 lb 10.5 oz)            Gen:  Elderly Caucasian M in NAD HEENT: Dry mucous membranes CV: Irregular rate and rhythm  PULM:  On RA, breathing is tachypneic ABD: soft/nontender  EXT: No edema  Neuro: Somnolent  PPS: 10%   This conversation/these recommendations were discussed with patient primary care team, Dr. Hildy Lowers  Billing based on MDM: High ______________________________________________________ [facility] Palliative Medicine Team Team Cell Phone: [phone] Please utilize secure chat with additional questions, if there is no response within 30 minutes please call the above phone number  Palliative Medicine Team providers are available by  phone from 7am to 7pm daily and can be reached through the team cell phone.  Should this patient require assistance outside of these hours, please call the patient's attending physician.

## 2024-01-29 NOTE — Progress Notes (Signed)
 MS wasted 83.7mL. Witnessed by Edwina Gram, RN

## 2024-01-29 NOTE — TOC Transition Note (Addendum)
 Transition of Care [facility]) - Discharge Note   Patient Details  Name: [name] MRN: [id number] Date of Birth: 02-Feb-1956  Transition of [facility]) CM/SW Contact:  Katrinka Parr, LCSW Phone Number: 01/29/2024, 2:41 PM   Clinical Narrative:     CSW notified by [facility] that pt was approved for their hospice facility, [facility]. They can admit today. RN to call report to [phone]. CSW will arrange for non-emergency ambulance transport once DC summary/orders are placed.  1630: Awaiting DC orders. CSW provided RN with PTAR number, [phone], to call for transport once orders are placed.   Final next level of care: Hospice Medical Facility Barriers to Discharge: Other (must enter comment) (Hospice facility pending DC summary/orders)       Discharge Plan and Services Additional resources added to the After Visit Summary for                                       Social Drivers of Health (SDOH) Interventions SDOH Screenings   Food Insecurity: Patient Declined (01/27/2024)  Housing: Patient Declined (01/27/2024)  Transportation Needs: Patient Declined (01/27/2024)  Utilities: Patient Declined (01/27/2024)  Alcohol Screen: Low Risk  (06/09/2023)  Depression (PHQ2-9): High Risk (01/10/2024)  Financial Resource Strain: Low Risk  (06/09/2023)  Physical Activity: Insufficiently Active (06/09/2023)  Social Connections: Patient Declined (01/27/2024)  Stress: No Stress Concern Present (06/09/2023)  Tobacco Use: Medium Risk (01/26/2024)  Health Literacy: Adequate Health Literacy (06/09/2023)     Readmission Risk Interventions     No data to display

## 2024-02-14 ENCOUNTER — Other Ambulatory Visit: Payer: Self-pay

## 2024-02-14 DIAGNOSIS — I70238 Atherosclerosis of native arteries of right leg with ulceration of other part of lower right leg: Secondary | ICD-10-CM

## 2024-02-15 NOTE — Death Summary Note (Deleted)
 Physician Discharge Summary  Patient ID: [name] MRN: [id number] DOB/AGE: August 19, 1956 68 y.o.  Admit date: 01/26/2024 Discharge date: 01/29/2024  Problem List Principal Problem:   Cerebral contusion [facility]) Active Problems:   ICH (intracerebral hemorrhage) (HCC)  HPI:  Hospital Course: 68 year old man with significant past medical hx for dementia, CAD, cardiomyopathy, CHF, DM2, Essential HTN, gout, HLD, Obesity, depression, PVD, and CVA who presented with altered mental status after suffering a fall in a restaurant restroom.He was found to have a right frontal hemorrhage consistent with a hemorrhagic contusion, likely exacerbated by chronic Plavix administration.   Physical Exam:  General: actively dying older male, lying in bed- comfortable/no distress  HEENT: Normocephalic, PERRLA sluggish, 2+ BL , poor dentition  Pink dry mm  CV: s1,s2, no MRG, No JVD  pulm: rhonchus, diminished, no distress Abs: bs active, soft  Extremities: 2+ edema generalized, no movement Skin: no rash  Neuro: Rass -3, minimal response to painful stimuli  GU: male purewick     Assessment and Plan   Encephalopathic secondary to large frontal hematoma with hemorrhagic conversion secondary to fall in a patient with progressive functional decline due to dementia. Dementia Type 2 diabetes HFrEF with EF 30% Hypertension Hypercholesterolemia  DNR/DNI with comfort measures:   P: -Initiating morphine gtt with boluses per Palliative Team -Continue scopolamine patch for nausea and zofran  -Continue Robinol for secretions -Continue zyprexa for agitation  -Transfer to [facility] in [place]  -Continue spiritual care and unrestricted visitation     Labs at discharge Lab Results  Component Value Date   CREATININE 0.98 01/27/2024   BUN 16 01/27/2024   NA 135 01/27/2024   K 4.0 01/27/2024   CL 100 01/27/2024   CO2 21 (L) 01/27/2024   Lab Results  Component Value Date    WBC 11.2 (H) 01/27/2024   HGB 12.3 (L) 01/27/2024   HCT 38.5 (L) 01/27/2024   MCV 87.5 01/27/2024   PLT 239 01/27/2024   Lab Results  Component Value Date   ALT 25 01/26/2024   AST 24 01/26/2024   ALKPHOS 74 01/26/2024   BILITOT 0.9 01/26/2024   Lab Results  Component Value Date   INR 1.0 12/17/2018   INR 1.01 09/18/2018   INR 1.4 03/24/2008    Current radiology studies No results found.  Disposition: [facility] in [place]    There are no questions and answers to display.         Allergies as of 01/29/2024       Reactions   Metformin And Related Other (See Comments)   GI symptoms   Statins Other (See Comments)   Legs ache, he states Crestor cause shoulder pain, states Crestor caused rash on hands   Vicodin [hydrocodone-acetaminophen] Nausea Only        Medication List     STOP taking these medications    aspirin EC 81 MG tablet   atorvastatin 10 MG tablet Commonly known as: LIPITOR   B-D ULTRAFINE III SHORT PEN 31G X 8 MM Misc Generic drug: Insulin Pen Needle   betamethasone valerate ointment 0.1 % Commonly known as: VALISONE   clopidogrel 75 MG tablet Commonly known as: PLAVIX   doxycycline 100 MG tablet Commonly known as: VIBRA-TABS   FreeStyle Libre 3 Plus Sensor Misc   FreeStyle Libre 3 Reader Devi   furosemide 20 MG tablet Commonly known as: LASIX   memantine 5 MG tablet Commonly known as: NAMENDA   metFORMIN  500 MG 24 hr tablet Commonly known as: GLUCOPHAGE-XR   metoprolol succinate 25 MG 24 hr tablet Commonly known as: TOPROL-XL   multivitamin with minerals tablet   mupirocin ointment 2 % Commonly known as: BACTROBAN   oxyCODONE-acetaminophen 10-325 MG tablet Commonly known as: PERCOCET   sertraline 100 MG tablet Commonly known as: ZOLOFT   silver sulfADIAZINE 1 % cream Commonly known as: SILVADENE   Tresiba FlexTouch 100 UNIT/ML FlexTouch Pen Generic drug: insulin degludec    valsartan 40 MG tablet Commonly known as: DIOVAN       TAKE these medications    glycopyrrolate 0.2 MG/ML injection Commonly known as: ROBINUL Inject 2 mLs (0.4 mg total) into the vein every 4 (four) hours.   HYDROmorphone 1 MG/ML injection Commonly known as: DILAUDID Inject 0.5 mLs (0.5 mg total) into the vein every 2 (two) hours as needed for severe pain (pain score 7-10).   morphine 1 mg/mL Soln infusion Inject 2-10 mg/hr into the vein continuous.   morphine Soln Inject 2 mg into the vein every 10 (ten) minutes as needed (Pain/Shortness of breath).   OLANZapine zydis 5 MG disintegrating tablet Commonly known as: ZYPREXA Take 1 tablet (5 mg total) by mouth 2 (two) times daily as needed (agitation).   ondansetron 4 MG disintegrating tablet Commonly known as: ZOFRAN-ODT Take 1 tablet (4 mg total) by mouth every 6 (six) hours as needed for nausea.   ondansetron 4 MG/2ML Soln injection Commonly known as: ZOFRAN Inject 2 mLs (4 mg total) into the vein every 6 (six) hours as needed for nausea.   polyvinyl alcohol 1.4 % ophthalmic solution Commonly known as: LIQUIFILM TEARS Place 1 drop into both eyes 4 (four) times daily as needed for dry eyes.   scopolamine 1 MG/3DAYS Commonly known as: TRANSDERM-SCOP Place 1 patch (1.5 mg total) onto the skin every 3 (three) days. Start taking on: February 01, 2024        Discharged Condition: fair, comfort measures   Time spent on discharge greater than 40 minutes.  Vital signs at Discharge. Temp:  [98.6 F (37 C)-98.9 F (37.2 C)] 98.9 F (37.2 C) (04/14 0747) Pulse Rate:  [91-100] 93 (04/14 0747) Resp:  [11-30] 18 (04/14 0747) BP: (129-156)/(77-95) 144/83 (04/14 0747) SpO2:  [95 %-100 %] 95 % (04/14 0747) Office follow up Special Information or instructions. Signed:  Rey Catholic AGACNP-BC   [facility] 01/29/2024, 3:59 PM  Please see [email or web address] for pager details.  From 7A-7P if no response,  please call [phone]. After hours, please call ELink [phone].

## 2024-02-15 DEATH — deceased

## 2024-02-16 ENCOUNTER — Ambulatory Visit (HOSPITAL_COMMUNITY)

## 2024-02-19 ENCOUNTER — Ambulatory Visit

## 2024-04-11 ENCOUNTER — Ambulatory Visit: Admitting: Family Medicine

## 2024-06-14 ENCOUNTER — Ambulatory Visit: Payer: Medicare HMO
